# Patient Record
Sex: Male | Born: 1965 | Race: White | Hispanic: No | Marital: Married | Smoking: Former smoker
Health system: Southern US, Community
[De-identification: ages and names within clinical notes are randomized; demographics above are authoritative.]

## PROBLEM LIST (undated history)

## (undated) DIAGNOSIS — I2510 Atherosclerotic heart disease of native coronary artery without angina pectoris: Secondary | ICD-10-CM

## (undated) DIAGNOSIS — Z9989 Dependence on other enabling machines and devices: Secondary | ICD-10-CM

## (undated) DIAGNOSIS — H269 Unspecified cataract: Secondary | ICD-10-CM

## (undated) DIAGNOSIS — I10 Essential (primary) hypertension: Secondary | ICD-10-CM

## (undated) DIAGNOSIS — D751 Secondary polycythemia: Secondary | ICD-10-CM

## (undated) DIAGNOSIS — Z95810 Presence of automatic (implantable) cardiac defibrillator: Secondary | ICD-10-CM

## (undated) DIAGNOSIS — I513 Intracardiac thrombosis, not elsewhere classified: Secondary | ICD-10-CM

## (undated) DIAGNOSIS — M810 Age-related osteoporosis without current pathological fracture: Secondary | ICD-10-CM

## (undated) DIAGNOSIS — N183 Chronic kidney disease, stage 3 unspecified: Secondary | ICD-10-CM

## (undated) DIAGNOSIS — M329 Systemic lupus erythematosus, unspecified: Secondary | ICD-10-CM

## (undated) DIAGNOSIS — M87059 Idiopathic aseptic necrosis of unspecified femur: Secondary | ICD-10-CM

## (undated) DIAGNOSIS — Z87448 Personal history of other diseases of urinary system: Secondary | ICD-10-CM

## (undated) DIAGNOSIS — I5084 End stage heart failure: Secondary | ICD-10-CM

## (undated) DIAGNOSIS — R51 Headache: Secondary | ICD-10-CM

## (undated) DIAGNOSIS — K219 Gastro-esophageal reflux disease without esophagitis: Secondary | ICD-10-CM

## (undated) DIAGNOSIS — E785 Hyperlipidemia, unspecified: Secondary | ICD-10-CM

## (undated) DIAGNOSIS — Z940 Kidney transplant status: Secondary | ICD-10-CM

## (undated) DIAGNOSIS — G4733 Obstructive sleep apnea (adult) (pediatric): Secondary | ICD-10-CM

## (undated) DIAGNOSIS — Z5189 Encounter for other specified aftercare: Secondary | ICD-10-CM

## (undated) DIAGNOSIS — K449 Diaphragmatic hernia without obstruction or gangrene: Secondary | ICD-10-CM

## (undated) DIAGNOSIS — K2270 Barrett's esophagus without dysplasia: Secondary | ICD-10-CM

## (undated) DIAGNOSIS — R7301 Impaired fasting glucose: Secondary | ICD-10-CM

## (undated) DIAGNOSIS — IMO0002 Reserved for concepts with insufficient information to code with codable children: Secondary | ICD-10-CM

## (undated) DIAGNOSIS — I509 Heart failure, unspecified: Secondary | ICD-10-CM

## (undated) DIAGNOSIS — I255 Ischemic cardiomyopathy: Secondary | ICD-10-CM

## (undated) DIAGNOSIS — N182 Chronic kidney disease, stage 2 (mild): Secondary | ICD-10-CM

## (undated) HISTORY — DX: End stage heart failure: I50.84

## (undated) HISTORY — DX: Secondary polycythemia: D75.1

## (undated) HISTORY — PX: INSERT / REPLACE / REMOVE PACEMAKER: SUR710

## (undated) HISTORY — DX: Systemic lupus erythematosus, unspecified: M32.9

## (undated) HISTORY — DX: Ischemic cardiomyopathy: I25.5

## (undated) HISTORY — DX: Personal history of other diseases of urinary system: Z87.448

## (undated) HISTORY — DX: Impaired fasting glucose: R73.01

## (undated) HISTORY — DX: Intracardiac thrombosis, not elsewhere classified: I51.3

## (undated) HISTORY — DX: Headache: R51

## (undated) HISTORY — DX: Idiopathic aseptic necrosis of unspecified femur: M87.059

## (undated) HISTORY — DX: Chronic kidney disease, stage 2 (mild): N18.2

## (undated) HISTORY — PX: TYMPANOSTOMY TUBE PLACEMENT: SHX32

## (undated) HISTORY — DX: Obstructive sleep apnea (adult) (pediatric): G47.33

## (undated) HISTORY — DX: Age-related osteoporosis without current pathological fracture: M81.0

## (undated) HISTORY — DX: Presence of automatic (implantable) cardiac defibrillator: Z95.810

## (undated) HISTORY — DX: Barrett's esophagus without dysplasia: K22.70

## (undated) HISTORY — PX: CARDIAC CATHETERIZATION: SHX172

## (undated) HISTORY — DX: Kidney transplant status: Z94.0

## (undated) HISTORY — DX: Unspecified cataract: H26.9

## (undated) HISTORY — PX: CARDIOVASCULAR STRESS TEST: SHX262

## (undated) HISTORY — PX: RENAL BIOPSY: SHX156

## (undated) HISTORY — DX: Encounter for other specified aftercare: Z51.89

## (undated) HISTORY — DX: Essential (primary) hypertension: I10

## (undated) HISTORY — DX: Chronic kidney disease, stage 3 unspecified: N18.30

## (undated) HISTORY — DX: Reserved for concepts with insufficient information to code with codable children: IMO0002

## (undated) HISTORY — PX: US ECHOCARDIOGRAPHY: HXRAD669

## (undated) HISTORY — DX: Dependence on other enabling machines and devices: Z99.89

## (undated) HISTORY — DX: Atherosclerotic heart disease of native coronary artery without angina pectoris: I25.10

## (undated) HISTORY — DX: Hyperlipidemia, unspecified: E78.5

## (undated) SURGERY — Surgical Case
Anesthesia: *Unknown

---

## 2008-05-14 HISTORY — PX: PARTIAL HIP ARTHROPLASTY: SHX733

## 2008-11-30 ENCOUNTER — Encounter: Payer: Self-pay | Admitting: Cardiovascular Disease

## 2009-01-28 ENCOUNTER — Encounter: Payer: Self-pay | Admitting: Cardiovascular Disease

## 2009-02-01 ENCOUNTER — Ambulatory Visit: Payer: Self-pay | Admitting: Cardiovascular Disease

## 2009-02-01 DIAGNOSIS — I255 Ischemic cardiomyopathy: Secondary | ICD-10-CM | POA: Insufficient documentation

## 2009-02-01 DIAGNOSIS — M329 Systemic lupus erythematosus, unspecified: Secondary | ICD-10-CM | POA: Insufficient documentation

## 2009-02-01 DIAGNOSIS — N189 Chronic kidney disease, unspecified: Secondary | ICD-10-CM | POA: Insufficient documentation

## 2009-02-02 ENCOUNTER — Encounter: Admission: RE | Admit: 2009-02-02 | Discharge: 2009-02-02 | Payer: Self-pay | Admitting: Orthopedic Surgery

## 2009-02-07 ENCOUNTER — Telehealth (INDEPENDENT_AMBULATORY_CARE_PROVIDER_SITE_OTHER): Payer: Self-pay | Admitting: *Deleted

## 2009-02-08 ENCOUNTER — Ambulatory Visit: Payer: Self-pay

## 2009-02-08 ENCOUNTER — Encounter: Payer: Self-pay | Admitting: Cardiology

## 2009-02-11 HISTORY — PX: ESOPHAGOGASTRODUODENOSCOPY: SHX1529

## 2009-02-17 ENCOUNTER — Telehealth (INDEPENDENT_AMBULATORY_CARE_PROVIDER_SITE_OTHER): Payer: Self-pay | Admitting: *Deleted

## 2009-02-21 ENCOUNTER — Telehealth: Payer: Self-pay | Admitting: Cardiovascular Disease

## 2009-02-23 ENCOUNTER — Inpatient Hospital Stay (HOSPITAL_COMMUNITY): Admission: RE | Admit: 2009-02-23 | Discharge: 2009-02-27 | Payer: Self-pay | Admitting: Orthopedic Surgery

## 2009-02-26 ENCOUNTER — Encounter: Payer: Self-pay | Admitting: Gastroenterology

## 2009-02-27 ENCOUNTER — Ambulatory Visit: Payer: Self-pay | Admitting: Gastroenterology

## 2009-06-08 ENCOUNTER — Encounter (INDEPENDENT_AMBULATORY_CARE_PROVIDER_SITE_OTHER): Payer: Self-pay | Admitting: *Deleted

## 2009-08-10 ENCOUNTER — Ambulatory Visit: Payer: Self-pay | Admitting: Cardiovascular Disease

## 2009-08-10 DIAGNOSIS — E782 Mixed hyperlipidemia: Secondary | ICD-10-CM | POA: Insufficient documentation

## 2009-08-10 DIAGNOSIS — I10 Essential (primary) hypertension: Secondary | ICD-10-CM | POA: Insufficient documentation

## 2010-05-14 DIAGNOSIS — I513 Intracardiac thrombosis, not elsewhere classified: Secondary | ICD-10-CM

## 2010-05-14 HISTORY — DX: Intracardiac thrombosis, not elsewhere classified: I51.3

## 2010-06-13 NOTE — Letter (Signed)
Summary: Appointment - Reminder [place], Dousman  [address] [place]   [place], [place] [address]   Phone: [phone]  Fax: [phone]     June 08, 2009 MRN: [id number]   [facility] [place] [address] [place], [place]  [address]   Dear Mr. Beth,  Our records indicate that it is time to schedule a follow-up appointment with Dr. Johnsie Cancel. It is very important that we reach you to schedule this appointment. We look forward to participating in your health care needs. Please contact us at the number listed above at your earliest convenience to schedule your appointment.  If you are unable to make an appointment at this time, give [place] a call so we can update our records.  Sincerely,   Darnell Level [facility] Scheduling Team

## 2010-06-13 NOTE — Assessment & Plan Note (Signed)
Summary: 62MO ROV   History of Present Illness: Roy Dyer  has long standing Lupus which required high dose steroids and made him osteopenic.  He has known CAD.  He had 3.43mm Vision stents placed to the RCA and Circ in [place] in 12/1999.  He had "heartburn" and diaphoresis as his anginal equivalent.  These symptoms have not returned but his activity has been very lmited due to his hip pain.  His last cath was in 2005 and his stents were patent.  He had a nonischemic myovue on 01/2009 to clear him for hip surgery   He denies SSCP, palpitatons, SOB, edema or syncope Dr Mayer Camel ended up doing his surgery on the left hip succesfully with good pain relief.  He will eventually need the right one done.  Current Problems (verified): 1)  Renal Failure, Chronic  (ICD-585.9) 2)  Preoperative Examination  (ICD-V72.84) 3)  Lupus  (ICD-710.0) 4)  Mi  (ICD-410.90)  Current Medications (verified): 1)  Lisinopril 10 Mg Tabs (Lisinopril) .... Take One Tablet By Mouth Daily 2)  Metoprolol Tartrate 100 Mg Tabs (Metoprolol Tartrate) .... Take One Tablet By Mouth Twice A Day 3)  Furosemide 40 Mg Tabs (Furosemide) .... Take One Tablet By Mouth Daily. 4)  Nifedipine 10 Mg Caps (Nifedipine) .[name].. 1 Tab By Mouth Two Times A Day 5)  Allopurinol 300 Mg Tabs (Allopurinol) .[name].. 1 Tab By Mouth Once Daily 6)  Nexium 40 Mg Cpdr (Esomeprazole Magnesium) .[name].. 1 Tab By Mouth Once Daily 7)  Aspirin Ec 325 Mg Tbec (Aspirin) .... Take One Tablet By Mouth Daily 8)  Lipitor 20 Mg Tabs (Atorvastatin Calcium) .... Take One Tablet By Mouth Daily. 9)  Niaspan 500 Mg Cr-Tabs (Niacin (Antihyperlipidemic)) .... 2 Tabs By Mouth At Bedtime  Allergies (verified): No Known Drug Allergies  Past History:  Past Medical History: Last updated: 2009/03/01 Lupus CRF:  Cr 2.8 Dr. 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   Vital Signs:  Patient profile:   45 year old male Height:      67 inches Weight:      228 pounds BMI:     35.84 Pulse rate:   73 / minute Resp:     14 per minute BP sitting:   114 / 77  (left arm)  Vitals Entered By: Burnett Kanaris (August 10, 2009 4:14 PM)  Physical Exam  General:  Affect appropriate Healthy:  appears stated age 66: normal Neck supple with no adenopathy JVP normal no bruits no thyromegaly Lungs clear with no wheezing and good diaphragmatic motion Heart:  S1/S2 no murmur,rub, gallop or click PMI normal Abdomen: benighn, BS positve, no tenderness, no AAA no bruit.  No HSM or HJR Distal pulses intact with no bruits No edema Neuro non-focal Skin warm and dry    Impression & Recommendations:  Problem # 1:  MI (ICD-410.90) CAD with prev stent.  Nonischemic myovue 9/10  Continue ASA and BB His updated medication list for this problem includes:    Lisinopril 10  Mg Tabs (Lisinopril) .[name]... Take one tablet by mouth daily    Metoprolol Tartrate 100 Mg Tabs (Metoprolol tartrate) .[name]... Take one tablet by mouth twice a day    Nifedipine 10 Mg Caps (Nifedipine) .[name]... 1 tab by mouth two times a day    Aspirin Ec 325 Mg Tbec (Aspirin) .[name]... Take one tablet by mouth daily  Problem # 2:  ESSENTIAL HYPERTENSION, BENIGN (ICD-401.1) Well controlled His updated medication list for this problem includes:    Lisinopril 10 Mg Tabs (Lisinopril) .[name]... Take one tablet by mouth daily    Metoprolol Tartrate 100 Mg Tabs (Metoprolol tartrate)  .[name]... Take one tablet by mouth twice a day    Furosemide 40 Mg Tabs (Furosemide) .[name]... Take one tablet by mouth daily.    Nifedipine 10 Mg Caps (Nifedipine) .[name]... 1 tab by mouth two times a day    Aspirin Ec 325 Mg Tbec (Aspirin) .[name]... Take one tablet by mouth daily  Problem # 3:  MIXED HYPERLIPIDEMIA (ICD-272.2) F/U labs primary  Continue statin His updated medication list for this problem includes:    Lipitor 20 Mg Tabs (Atorvastatin calcium) .[name]... Take one tablet by mouth daily.    Niaspan 500 Mg Cr-tabs (Niacin (antihyperlipidemic)) .[name]... 2 tabs by mouth at bedtime  Patient Instructions: 1)  Your physician recommends that you schedule a follow-up appointment in: [place] 2)  Your physician recommends that you continue on your current medications as directed. Please refer to the Current Medication list given to you today.

## 2010-07-03 ENCOUNTER — Inpatient Hospital Stay (HOSPITAL_COMMUNITY)
Admission: EM | Admit: 2010-07-03 | Discharge: 2010-07-06 | DRG: 281 | Disposition: A | Discharge status: Home / Self Care | Payer: Managed Care, Other (non HMO) | Attending: Cardiovascular Disease | Admitting: Cardiovascular Disease

## 2010-07-03 DIAGNOSIS — I214 Non-ST elevation (NSTEMI) myocardial infarction: Principal | ICD-10-CM | POA: Diagnosis present

## 2010-07-03 DIAGNOSIS — F17200 Nicotine dependence, unspecified, uncomplicated: Secondary | ICD-10-CM | POA: Diagnosis present

## 2010-07-03 DIAGNOSIS — Z9861 Coronary angioplasty status: Secondary | ICD-10-CM

## 2010-07-03 DIAGNOSIS — M169 Osteoarthritis of hip, unspecified: Secondary | ICD-10-CM | POA: Diagnosis present

## 2010-07-03 DIAGNOSIS — M329 Systemic lupus erythematosus, unspecified: Secondary | ICD-10-CM | POA: Diagnosis present

## 2010-07-03 DIAGNOSIS — IMO0002 Reserved for concepts with insufficient information to code with codable children: Secondary | ICD-10-CM

## 2010-07-03 DIAGNOSIS — R079 Chest pain, unspecified: Secondary | ICD-10-CM

## 2010-07-03 DIAGNOSIS — Z7982 Long term (current) use of aspirin: Secondary | ICD-10-CM

## 2010-07-03 DIAGNOSIS — E785 Hyperlipidemia, unspecified: Secondary | ICD-10-CM | POA: Diagnosis present

## 2010-07-03 DIAGNOSIS — I129 Hypertensive chronic kidney disease with stage 1 through stage 4 chronic kidney disease, or unspecified chronic kidney disease: Secondary | ICD-10-CM | POA: Diagnosis present

## 2010-07-03 DIAGNOSIS — I2510 Atherosclerotic heart disease of native coronary artery without angina pectoris: Secondary | ICD-10-CM | POA: Diagnosis present

## 2010-07-03 DIAGNOSIS — M1610 Unilateral primary osteoarthritis, unspecified hip: Secondary | ICD-10-CM | POA: Diagnosis present

## 2010-07-03 DIAGNOSIS — K219 Gastro-esophageal reflux disease without esophagitis: Secondary | ICD-10-CM | POA: Diagnosis present

## 2010-07-03 DIAGNOSIS — M899 Disorder of bone, unspecified: Secondary | ICD-10-CM | POA: Diagnosis present

## 2010-07-03 DIAGNOSIS — N184 Chronic kidney disease, stage 4 (severe): Secondary | ICD-10-CM | POA: Diagnosis present

## 2010-07-04 ENCOUNTER — Emergency Department (HOSPITAL_COMMUNITY): Payer: Managed Care, Other (non HMO)

## 2010-07-04 DIAGNOSIS — R072 Precordial pain: Secondary | ICD-10-CM

## 2010-07-04 LAB — CBC
MCH: 32.9 pg (ref 26.0–34.0)
MCHC: 34.8 g/dL (ref 30.0–36.0)
MCV: 94.5 fL (ref 78.0–100.0)
RDW: 13.7 % (ref 11.5–15.5)
WBC: 11.6 10*3/uL — ABNORMAL HIGH (ref 4.0–10.5)

## 2010-07-04 LAB — HEMOGLOBIN A1C: Mean Plasma Glucose: 117 mg/dL — ABNORMAL HIGH (ref <117)

## 2010-07-04 LAB — CARDIAC PANEL(CRET KIN+CKTOT+MB+TROPI)
CK, MB: 3.9 ng/mL (ref 0.3–4.0)
CK, MB: 2.5 ng/mL (ref 0.3–4.0)
Relative Index: INVALID (ref 0.0–2.5)
Relative Index: INVALID (ref 0.0–2.5)
Total CK: 89 U/L (ref 7–232)

## 2010-07-04 LAB — HEPARIN LEVEL (UNFRACTIONATED): Heparin Unfractionated: 0.44 IU/mL (ref 0.30–0.70)

## 2010-07-04 LAB — BASIC METABOLIC PANEL
BUN: 41 mg/dL — ABNORMAL HIGH (ref 6–23)
GFR calc non Af Amer: 24 mL/min — ABNORMAL LOW (ref >60)
Potassium: 5.1 mEq/L (ref 3.5–5.1)
Sodium: 138 mEq/L (ref 135–145)

## 2010-07-04 LAB — TROPONIN I: Troponin I: 0.2 ng/mL — ABNORMAL HIGH (ref 0.00–0.06)

## 2010-07-04 LAB — PROTIME-INR
INR: 0.9 (ref 0.00–1.49)
Prothrombin Time: 12.4 seconds (ref 11.6–15.2)

## 2010-07-04 LAB — LIPID PANEL
Cholesterol: 118 mg/dL (ref 0–200)
HDL: 47 mg/dL (ref >39)

## 2010-07-04 LAB — DIFFERENTIAL
Monocytes Relative: 8 % (ref 3–12)
Neutro Abs: 4.9 10*3/uL (ref 1.7–7.7)

## 2010-07-04 LAB — CK TOTAL AND CKMB (NOT AT ARMC)
CK, MB: 1.5 ng/mL (ref 0.3–4.0)
Relative Index: INVALID (ref 0.0–2.5)

## 2010-07-04 LAB — POCT CARDIAC MARKERS
CKMB, poc: <1 ng/mL — ABNORMAL LOW (ref 1.0–8.0)
Myoglobin, poc: 179 ng/mL (ref 12–200)

## 2010-07-05 LAB — HEPARIN LEVEL (UNFRACTIONATED)
Heparin Unfractionated: 0.28 IU/mL — ABNORMAL LOW (ref 0.30–0.70)
Heparin Unfractionated: 0.63 IU/mL (ref 0.30–0.70)

## 2010-07-05 LAB — CBC
MCH: 32.1 pg (ref 26.0–34.0)
MCV: 95.4 fL (ref 78.0–100.0)
Platelets: 170 10*3/uL (ref 150–400)
RDW: 13.8 % (ref 11.5–15.5)
WBC: 7.8 10*3/uL (ref 4.0–10.5)

## 2010-07-05 LAB — COMPREHENSIVE METABOLIC PANEL
ALT: 14 U/L (ref 0–53)
Alkaline Phosphatase: 145 U/L — ABNORMAL HIGH (ref 39–117)
CO2: 24 mEq/L (ref 19–32)
Calcium: 8.9 mg/dL (ref 8.4–10.5)
Chloride: 111 mEq/L (ref 96–112)
Potassium: 5 mEq/L (ref 3.5–5.1)
Total Bilirubin: 0.4 mg/dL (ref 0.3–1.2)
Total Protein: 5.6 g/dL — ABNORMAL LOW (ref 6.0–8.3)

## 2010-07-05 LAB — CARDIAC PANEL(CRET KIN+CKTOT+MB+TROPI): Relative Index: INVALID (ref 0.0–2.5)

## 2010-07-06 ENCOUNTER — Observation Stay (HOSPITAL_COMMUNITY): Payer: Managed Care, Other (non HMO)

## 2010-07-06 MED ORDER — TECHNETIUM TC 99M TETROFOSMIN IV KIT
30.0000 | PACK | Freq: Once | INTRAVENOUS | Status: AC | PRN
  Administered 2010-07-06: 14:00:00 30 via INTRAVENOUS

## 2010-07-06 MED ORDER — TECHNETIUM TC 99M TETROFOSMIN IV KIT
10.0000 | PACK | Freq: Once | INTRAVENOUS | Status: AC | PRN
  Administered 2010-07-06: 10 via INTRAVENOUS

## 2010-07-13 NOTE — H&P (Signed)
NAMEEDUIN, BRESLIN NO.:  [id number]  MEDICAL RECORD NO.:  [id number]           PATIENT TYPE:  E  LOCATION:  [facility]                         FACILITY:  [facility]  PHYSICIAN:  Crissie Reese, MD    DATE OF BIRTH:  08-24-1965  DATE OF ADMISSION:  07/03/2010 DATE OF DISCHARGE:                             HISTORY & PHYSICAL   PRIMARY CARDIOLOGIST:  Collier Salina C. Johnsie Cancel, MD, [facility]  CHIEF COMPLAINT:  Chest pain and throat pain.  HISTORY OF PRESENT ILLNESS:  This is a 45 year old white male with past medical history significant for coronary disease status post stenting to the right coronary artery and left circumflex artery in 2001, chronic kidney disease, and lupus who presents for evaluation of throat burning and chest pain.  Historically, the patient underwent cardiac catheterization in 2001 with stenting.  Since that time, he has had a negative catheterization in 2005 as well as a normal Myoview study in 2010.  He has not had to use any nitroglycerin since his initial acute coronary syndrome.  This evening at approximately 11:00 p.m., he went to bed and after lying down in bed he developed acute burning in his throat associated with mild chest tightness.  This was also associated with shortness of breath.  He got up to get some water and then took a nitroglycerin and these symptoms gradually subsided after approximately 15 minutes. However, then they returned with similar symptoms, he took another nitroglycerin, and called emergency medical services.  Currently, his symptoms have resolved.  He states that these symptoms are different than his previous symptoms with his acute coronary syndrome.  With respect to his functional status, he says that he is limited in his activity by chronic hip pain.  However, he is able to do his activities of daily living.  He is able to climb stairs without any significant exertional chest pain or shortness of breath.  He denies any  PND, orthopnea, or lower extremity edema.  He denies any tachy palpitations or syncope.  In the emergency room, the patient received aspirin, nitroglycerin paste, morphine and was written for IV unfractionated heparin.  PAST MEDICAL HISTORY: 1. Coronary artery disease.  He is status post Vision stent to the     right coronary and left circumflex in 2001.  He had a cardiac     catheterization which showed patent stents in 2005 and had a normal     stress test in 2010. 2. Hypertension. 3. Hyperlipidemia. 4. Chronic kidney disease with a baseline creatinine of 2.9. 5. Lupus with history of chronic steroid use. 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 7. Status post left hip surgery and active right hip osteoarthritis.  SOCIAL HISTORY:  The patient lives at home and works in [employer].  He continues to smoke half-pack per day. He denies any alcohol use or drug use.  FAMILY HISTORY:  Notable for premature coronary artery disease and his father had a myocardial infarction at age 42.  ALLERGIES:  No known drug allergies.MEDICATIONS: 1. Niaspan 1 gram at bedtime. 2. Lipitor 20 mg daily. 3. Lisinopril 10 mg daily. 4.  Lasix 40 mg daily. 5. Allopurinol 300 mg daily. 6. Nexium 40 mg daily. 7. Aspirin 325 mg daily. 8. Nifedipine 10 mg daily. 9. Metoprolol tartrate 100 mg b.i.d.  PHYSICAL EXAMINATION:  VITAL SIGNS:  Temperature afebrile, blood pressure is 121/83, pulse 63, respirations 18, oxygen saturation 97% on room air. GENERAL:  No acute distress. HEENT:  Normocephalic and atraumatic.  Pupils equal, round, and reactive to light and accommodation.  Oropharynx pink and moist without lesions. NECK:  Supple.  No lymphadenopathy.  No jugular venous distention.  No masses. CARDIOVASCULAR:  Regular rate and rhythm with no murmurs, rubs, or gallops. CHEST:  Clear to auscultation bilaterally. ABDOMEN:  Positive bowel sounds, soft, nontender, and nondistended. EXTREMITIES:  No  clubbing, cyanosis, or edema.  Dorsalis pedis pulses 2+ bilaterally. SKIN:  No rashes. BACK:  No CVA tenderness. NEUROLOGIC:  No focal deficits. PSYCH:  Normal affect.  PERTINENT DATA: 1. Labs notable for white blood count of 11.6, hemoglobin 15.6, and     platelets 208.  Potassium 5.1, BUN 41, creatinine 2.9 and these     were stable.  Troponin less than 0.05. 2. Chest x-ray shows no acute cardiopulmonary disease. 3. EKG from 23:57 demonstrates sinus rhythm at 65 beats per minute.     No acute ST or T-wave changes.  IMPRESSION AND PLAN:  Mr. Ficca is a 45 year old white male with a past medical history significant for coronary disease status post stenting in 2001, chronic kidney disease, hypertension, hyperlipidemia, and lupus who presents for evaluation of throat pain and chest pain.  He has negative cardiac biomarkers and a normal EKG. 1. We will admit the patient to [facility] under Dr. Kyla Balzarine     care. 2. Chest pain and throat pain.  His symptoms could be coming from     unstable angina and they are somewhat atypical and were different     than his previous myocardial infarction.  Additionally, he hasobjective markers including his troponins and his EKG did not show     any evidence of myocardial ischemia.  Other etiologies that are     more likely include gastroesophageal reflux disease and esophageal     spasms.  We will admit the patient and rule out for myocardial     infarction with serial cardiac enzymes.  We will place him on IV     heparin drip until he rules out for myocardial infarction.  We will     continue his home medications of aspirin, beta-blocker, and statin.     If he rules out for myocardial infarction, I think he would be a     reasonable candidate for a stress test to further risk stratify     him.  I would not recommend proceeding to a cardiac catheterization     at this time unless he develops subjective evidence of myocardial     ischemia.  I  would not recommend doing this based on his chronic     kidney disease.  I have counseled him on the need for tobacco     cessation and lifestyle modification. 3. Hypertension, appears to be well-controlled.  Continue home     medications. 4. Hyperlipidemia.  Check fasting lipid profile.  Continue Lipitor. 5. Chronic kidney disease with baseline creatinine of 2.8, this is     stable.  We will continue to monitor this. 6. History of gout.  Continue home allopurinol. 7. Gastroesophageal reflux disease.  Continue Nexium. 8. Deep venous thrombosis prophylaxis, not  indicated as the patient is     on systemic anticoagulation with heparin.     Crissie Reese, MD     SJT/MEDQ  D:  07/04/2010  T:  07/04/2010  Job:  [id number]  Electronically Signed by Clovis Riley MD on 07/13/2010 08:34:08 PM

## 2010-07-21 NOTE — Discharge Summary (Signed)
NAMEJEFFRIE, DICOLA NO.:  [id number]  MEDICAL RECORD NO.:  [id number]           PATIENT TYPE:  I  LOCATION:  6526                         FACILITY:  [facility]  PHYSICIAN:  Safiya Girdler C. Johnsie Cancel, MD, FACCDATE OF BIRTH:  05-Feb-1966  DATE OF ADMISSION:  07/03/2010 DATE OF DISCHARGE:  07/06/2010                              DISCHARGE SUMMARY   DISCHARGE DIAGNOSES: 1. Non-ST-elevation myocardial infarction.     a.     Peak troponin on second set with normal CK and MB,      downtrending afterwards.  CK and MB were never elevated.     bLeane Call, July 06, 2010:  Images revealed normal      uptake in all segments.  Wall motion analysis reveals EF of 55%      with no significant wall motion abnormalities.  No definite scar      or ischemia.  Normal pharmacologic stress nuclear scan. 2. Ongoing tobacco abuse (20+ pack years, currently one-half ppd).     a.     Tobacco cessation consult completed as well as extensive      education/instruction by [facility] staff.  SECONDARY DIAGNOSES: 1. Coronary artery disease.     a.     S/P stenting to RCA and left circumflex, 2001.     b.     Cardiac catheterization without significant findings, 2005.     c.     Negative Myoview, 2010. 2. Lupus (history of chronic steroid use). 3. Chronic kidney disease, baseline creatinine 2.9. 4. Hypertension. 5. Hyperlipidemia. 6. Osteopenia (from steroids). 7. S/P left hip surgery, active right hip osteoarthritis.  ALLERGIES:  NKDA.  PROCEDURES: 1. EKG, July 03, 2010:  SR, 65 bpm, no significant ST-T wave     changes. 2. Chest x-ray on July 03, 2010:  No acute cardiopulmonary     disease. 3. [name], July 06, 2010:  Please see discharge     diagnoses section #1, subsection b.  HISTORY OF PRESENT ILLNESS:  Roy Dyer is a 45 year old Caucasian gentleman with the above noted complex medical history who presented to [facility] on the late evening  of July 03, 2010, with complaints of chest discomfort.  The patient reports being in his usual state of health until approximately 11:00 p.m. on the date of presentation when he developed acute onset of burning after lying down flat in bed to go to sleep.  The burning was in his throat and was associated with mild chest tightness. It was also associated with shortness of breath.  He got up and took some water and then took some nitroglycerin with gradual resolution of his symptoms after approximately 15 minutes.  Unfortunately, they did return and he took another nitroglycerin and called EMS.  When seen in the emergency department by the fellow on-call for cardiology, his symptoms had resolved again.  He did report that these symptoms are different from his prior angina.  In the ED, the patient received aspirin, nitroglycerin paste, morphine, and was written for IV heparin.  HOSPITAL COURSE:  The  patient was noted to have mildly elevated cardiac enzymes after initial point-of-care markers were negative.  First set ofenzymes, troponin 0.20, second set peaked at 1.05, third set troponin 0.52, and fourth and final set 0.41.  Of note, the patient's CK and MB were always within normal limits.  Due to the patient's chronic kidney disease and curious cardiac enzyme elevation with no significant EKG changes, the patient was set up for [facility] for risk stratification.  Luckily, that study showed no ischemia and no scar, normal left ventricular systolic function, and as the patient was no longer having any symptoms, he was deemed stable for discharge on the afternoon of July 06, 2010.  At that time, the patient will receive his old medication list unchanged, followup instructions, and all questions and concerns were addressed prior to him leaving the hospital. The patient will follow up with his primary cardiologist in 2-4 weeks (appointment to be scheduled as office is  currently closed and patient will be contacted) as well as with his primary care physician in the next 1-2 weeks.  DISCHARGE LABS:  WBC is 7.0, HGB 14.6, HCT 43.4, and PLT count is 170. Neutrophils on admission were 42, eosinophils 6; lymphocytes, absolute 5.0; otherwise all values in differential within normal limits.  Pro time 12.4, INR 0.90.  Sodium 140, potassium 5.0, chloride 111, bicarb 24, BUN 42, creatinine 2.99, and glucose 100.  Liver function tests notable for all values within normal limits except for alkaline phosphatase at 145; total protein 5.6, albumin 3.3, calcium 8.9. Hemoglobin A1c 5.7%, mean plasma glucose 117.  Cardiac enzymes, please see hospital course.  Total cholesterol 118, triglycerides 78, HDL 47, LDL 55, and total cholesterol/HDL ratio of 2.5.  FOLLOWUP PLANS AND APPOINTMENTS:  Please see hospital course.  DISCHARGE MEDICATIONS: 1. Allopurinol 300 mg 1 tablet p.o. daily. 2. Aspirin 325 mg 1 tablet p.o. daily. 3. Lasix 40 mg 1 tablet p.o. daily. 4. Lipitor 20 mg 1 tablet p.o. daily. 5. Lisinopril 10 mg 1 tablet p.o. daily. 6. Metoprolol tartrate 100 mg 1 tablet p.o. b.i.d. 7. Nexium 40 mg 1 capsule p.o. daily. 8. Niaspan 500 mg 2 tablets p.o. at bedtime. 9. Nifedipine 10 mg 1 tablet p.o. daily.  DURATION OF DISCHARGE ENCOUNTER INCLUDING PHYSICIAN TIME:  35 minutes.     [name], PAC   ______________________________ Wallis Bamberg. Johnsie Cancel, MD, [facility]    MS/MEDQ  D:  07/06/2010  T:  07/07/2010  Job:  [id number]  Electronically Signed by [name] PAC on 07/18/2010 04:22:50 PM Electronically Signed by [name] MD [place] on 07/20/2010 09:07:04 AM

## 2010-07-26 ENCOUNTER — Encounter: Payer: Self-pay | Admitting: Cardiovascular Disease

## 2010-08-02 ENCOUNTER — Inpatient Hospital Stay (HOSPITAL_COMMUNITY)
Admission: EM | Admit: 2010-08-02 | Discharge: 2010-08-07 | DRG: 249 | Disposition: A | Discharge status: Home / Self Care | Payer: Managed Care, Other (non HMO) | Source: Ambulatory Visit | Attending: Cardiovascular Disease | Admitting: Cardiovascular Disease

## 2010-08-02 ENCOUNTER — Telehealth: Payer: Self-pay | Admitting: Cardiovascular Disease

## 2010-08-02 ENCOUNTER — Inpatient Hospital Stay (HOSPITAL_COMMUNITY): Payer: Managed Care, Other (non HMO)

## 2010-08-02 DIAGNOSIS — M329 Systemic lupus erythematosus, unspecified: Secondary | ICD-10-CM | POA: Diagnosis present

## 2010-08-02 DIAGNOSIS — Z7982 Long term (current) use of aspirin: Secondary | ICD-10-CM

## 2010-08-02 DIAGNOSIS — I2510 Atherosclerotic heart disease of native coronary artery without angina pectoris: Secondary | ICD-10-CM

## 2010-08-02 DIAGNOSIS — N179 Acute kidney failure, unspecified: Secondary | ICD-10-CM | POA: Diagnosis not present

## 2010-08-02 DIAGNOSIS — I252 Old myocardial infarction: Secondary | ICD-10-CM

## 2010-08-02 DIAGNOSIS — I2589 Other forms of chronic ischemic heart disease: Secondary | ICD-10-CM | POA: Diagnosis present

## 2010-08-02 DIAGNOSIS — F17200 Nicotine dependence, unspecified, uncomplicated: Secondary | ICD-10-CM | POA: Diagnosis present

## 2010-08-02 DIAGNOSIS — N039 Chronic nephritic syndrome with unspecified morphologic changes: Secondary | ICD-10-CM | POA: Diagnosis present

## 2010-08-02 DIAGNOSIS — I2109 ST elevation (STEMI) myocardial infarction involving other coronary artery of anterior wall: Principal | ICD-10-CM | POA: Diagnosis present

## 2010-08-02 DIAGNOSIS — IMO0002 Reserved for concepts with insufficient information to code with codable children: Secondary | ICD-10-CM

## 2010-08-02 DIAGNOSIS — M899 Disorder of bone, unspecified: Secondary | ICD-10-CM | POA: Diagnosis present

## 2010-08-02 DIAGNOSIS — I129 Hypertensive chronic kidney disease with stage 1 through stage 4 chronic kidney disease, or unspecified chronic kidney disease: Secondary | ICD-10-CM | POA: Diagnosis present

## 2010-08-02 DIAGNOSIS — R0789 Other chest pain: Secondary | ICD-10-CM

## 2010-08-02 DIAGNOSIS — B029 Zoster without complications: Secondary | ICD-10-CM | POA: Diagnosis present

## 2010-08-02 DIAGNOSIS — Z96649 Presence of unspecified artificial hip joint: Secondary | ICD-10-CM

## 2010-08-02 DIAGNOSIS — K219 Gastro-esophageal reflux disease without esophagitis: Secondary | ICD-10-CM | POA: Diagnosis present

## 2010-08-02 DIAGNOSIS — E785 Hyperlipidemia, unspecified: Secondary | ICD-10-CM | POA: Diagnosis present

## 2010-08-02 DIAGNOSIS — I517 Cardiomegaly: Secondary | ICD-10-CM

## 2010-08-02 DIAGNOSIS — N183 Chronic kidney disease, stage 3 unspecified: Secondary | ICD-10-CM | POA: Diagnosis present

## 2010-08-02 LAB — POCT ACTIVATED CLOTTING TIME: Activated Clotting Time: 876 seconds

## 2010-08-02 LAB — CBC
HCT: 43.3 % (ref 39.0–52.0)
Hemoglobin: 15.1 g/dL (ref 13.0–17.0)
MCH: 33.1 pg (ref 26.0–34.0)
MCHC: 34.9 g/dL (ref 30.0–36.0)

## 2010-08-02 LAB — POCT I-STAT, CHEM 8
BUN: 40 mg/dL — ABNORMAL HIGH (ref 6–23)
Calcium, Ion: 1.18 mmol/L (ref 1.12–1.32)
Chloride: 115 mEq/L — ABNORMAL HIGH (ref 96–112)
Glucose, Bld: 158 mg/dL — ABNORMAL HIGH (ref 70–99)

## 2010-08-02 LAB — CARDIAC PANEL(CRET KIN+CKTOT+MB+TROPI)
CK, MB: 330.8 ng/mL (ref 0.3–4.0)
Relative Index: 9.5 — ABNORMAL HIGH (ref 0.0–2.5)
Total CK: 3191 U/L — ABNORMAL HIGH (ref 7–232)

## 2010-08-02 LAB — PROTIME-INR: INR: 1.3 (ref 0.00–1.49)

## 2010-08-02 NOTE — Telephone Encounter (Signed)
Advised patient to call 911 and go to the ER.  Trish aware

## 2010-08-02 NOTE — Telephone Encounter (Signed)
Pt having chest pain and sweating for 30 mins. Pt has taken 2 nitros.

## 2010-08-03 ENCOUNTER — Inpatient Hospital Stay (HOSPITAL_COMMUNITY): Payer: Managed Care, Other (non HMO)

## 2010-08-03 DIAGNOSIS — I2109 ST elevation (STEMI) myocardial infarction involving other coronary artery of anterior wall: Secondary | ICD-10-CM

## 2010-08-03 LAB — RENAL FUNCTION PANEL
Albumin: 3.2 g/dL — ABNORMAL LOW (ref 3.5–5.2)
CO2: 20 mEq/L (ref 19–32)
Calcium: 8.7 mg/dL (ref 8.4–10.5)
GFR calc Af Amer: 31 mL/min — ABNORMAL LOW (ref >60)
GFR calc non Af Amer: 26 mL/min — ABNORMAL LOW (ref >60)
Phosphorus: 3.5 mg/dL (ref 2.3–4.6)
Sodium: 141 mEq/L (ref 135–145)

## 2010-08-03 LAB — CARDIAC PANEL(CRET KIN+CKTOT+MB+TROPI)
Relative Index: 12.2 — ABNORMAL HIGH (ref 0.0–2.5)
Total CK: 2409 U/L — ABNORMAL HIGH (ref 7–232)

## 2010-08-03 LAB — CBC
HCT: 44 % (ref 39.0–52.0)
Platelets: 126 10*3/uL — ABNORMAL LOW (ref 150–400)
RDW: 14.2 % (ref 11.5–15.5)
WBC: 7 10*3/uL (ref 4.0–10.5)

## 2010-08-03 LAB — SEDIMENTATION RATE: Sed Rate: 1 mm/hr (ref 0–16)

## 2010-08-03 LAB — HEPARIN LEVEL (UNFRACTIONATED)
Heparin Unfractionated: 0.27 IU/mL — ABNORMAL LOW (ref 0.30–0.70)
Heparin Unfractionated: 0.44 IU/mL (ref 0.30–0.70)

## 2010-08-03 LAB — PTH, INTACT AND CALCIUM: PTH: 125.2 pg/mL — ABNORMAL HIGH (ref 14.0–72.0)

## 2010-08-04 DIAGNOSIS — I2119 ST elevation (STEMI) myocardial infarction involving other coronary artery of inferior wall: Secondary | ICD-10-CM

## 2010-08-04 LAB — BASIC METABOLIC PANEL
BUN: 31 mg/dL — ABNORMAL HIGH (ref 6–23)
Chloride: 108 mEq/L (ref 96–112)
GFR calc non Af Amer: 22 mL/min — ABNORMAL LOW (ref >60)
Glucose, Bld: 101 mg/dL — ABNORMAL HIGH (ref 70–99)
Potassium: 4.2 mEq/L (ref 3.5–5.1)
Sodium: 138 mEq/L (ref 135–145)

## 2010-08-04 LAB — HEPARIN LEVEL (UNFRACTIONATED): Heparin Unfractionated: 0.49 IU/mL (ref 0.30–0.70)

## 2010-08-04 LAB — CBC
Hemoglobin: 14.1 g/dL (ref 13.0–17.0)
MCH: 32.5 pg (ref 26.0–34.0)
MCV: 94.5 fL (ref 78.0–100.0)
Platelets: 106 10*3/uL — ABNORMAL LOW (ref 150–400)
RBC: 4.34 MIL/uL (ref 4.22–5.81)
WBC: 7.4 10*3/uL (ref 4.0–10.5)

## 2010-08-04 LAB — ANTI-DNA ANTIBODY, DOUBLE-STRANDED: ds DNA Ab: 3 IU/mL (ref <30)

## 2010-08-05 ENCOUNTER — Inpatient Hospital Stay (HOSPITAL_COMMUNITY): Payer: Managed Care, Other (non HMO)

## 2010-08-05 LAB — BASIC METABOLIC PANEL
CO2: 23 mEq/L (ref 19–32)
Chloride: 109 mEq/L (ref 96–112)
GFR calc Af Amer: 26 mL/min — ABNORMAL LOW (ref >60)
Potassium: 4.3 mEq/L (ref 3.5–5.1)

## 2010-08-05 LAB — HEPARIN LEVEL (UNFRACTIONATED): Heparin Unfractionated: 0.66 IU/mL (ref 0.30–0.70)

## 2010-08-05 LAB — CBC
HCT: 40.3 % (ref 39.0–52.0)
Hemoglobin: 14 g/dL (ref 13.0–17.0)
MCHC: 34.7 g/dL (ref 30.0–36.0)
RBC: 4.24 MIL/uL (ref 4.22–5.81)

## 2010-08-06 DIAGNOSIS — I428 Other cardiomyopathies: Secondary | ICD-10-CM

## 2010-08-06 LAB — BASIC METABOLIC PANEL
BUN: 33 mg/dL — ABNORMAL HIGH (ref 6–23)
CO2: 23 mEq/L (ref 19–32)
Chloride: 109 mEq/L (ref 96–112)
Creatinine, Ser: 3.29 mg/dL — ABNORMAL HIGH (ref 0.4–1.5)
Glucose, Bld: 91 mg/dL (ref 70–99)
Potassium: 4.2 mEq/L (ref 3.5–5.1)

## 2010-08-06 LAB — CBC
HCT: 39 % (ref 39.0–52.0)
Hemoglobin: 13.6 g/dL (ref 13.0–17.0)
MCH: 33.2 pg (ref 26.0–34.0)
MCV: 95.1 fL (ref 78.0–100.0)
RBC: 4.1 MIL/uL — ABNORMAL LOW (ref 4.22–5.81)
WBC: 5.6 10*3/uL (ref 4.0–10.5)

## 2010-08-06 LAB — HEPARIN LEVEL (UNFRACTIONATED): Heparin Unfractionated: <0.1 IU/mL — ABNORMAL LOW (ref 0.30–0.70)

## 2010-08-07 LAB — BASIC METABOLIC PANEL
BUN: 33 mg/dL — ABNORMAL HIGH (ref 6–23)
CO2: 22 mEq/L (ref 19–32)
Calcium: 8.7 mg/dL (ref 8.4–10.5)
Glucose, Bld: 105 mg/dL — ABNORMAL HIGH (ref 70–99)
Sodium: 135 mEq/L (ref 135–145)

## 2010-08-07 LAB — CBC
HCT: 39.5 % (ref 39.0–52.0)
Hemoglobin: 13.8 g/dL (ref 13.0–17.0)
MCH: 32.9 pg (ref 26.0–34.0)
MCHC: 34.9 g/dL (ref 30.0–36.0)
MCV: 94.3 fL (ref 78.0–100.0)

## 2010-08-09 ENCOUNTER — Encounter: Payer: Managed Care, Other (non HMO) | Admitting: Cardiovascular Disease

## 2010-08-11 ENCOUNTER — Telehealth: Payer: Self-pay | Admitting: Cardiovascular Disease

## 2010-08-11 NOTE — Telephone Encounter (Signed)
Pt would like to return to work prior to April 18 appt with Dr. Johnsie Cancel. He would like to return on April 9 if OK with Dr. Johnsie Cancel.  His job is mostly sitting at a computer.  I told pt I would forward his request to Dr. Johnsie Cancel and we would call him back with his recommendations.

## 2010-08-11 NOTE — Telephone Encounter (Signed)
Pt would like to return to work. Was seen in hospital and Dr Johnsie Cancel told him that he could return sooner that the 18th that his appt is. Could he get a note to return

## 2010-08-13 NOTE — Telephone Encounter (Signed)
Ok to give note to return to work. 

## 2010-08-14 ENCOUNTER — Encounter: Payer: Self-pay | Admitting: *Deleted

## 2010-08-14 NOTE — Telephone Encounter (Signed)
Letter generated and mailed to the pt at his request.

## 2010-08-16 NOTE — Procedures (Signed)
NAMESCORPIO, Roy Dyer NO.:  [id number]  MEDICAL RECORD NO.:  [id number]           PATIENT TYPE:  I  LOCATION:  2909                         FACILITY:  [facility]  PHYSICIAN:  Kathlyn Sacramento, MD     DATE OF BIRTH:  05/29/1965  DATE OF PROCEDURE:  08/02/2010 DATE OF DISCHARGE:                           CARDIAC CATHETERIZATION   PROCEDURES PERFORMED: 1. Left heart catheterization. 2. Coronary angiography. 3. Left anterior descending artery angioplasty, thrombectomy, and bare-     metal stent placement to the mid left anterior descending coronary     artery. 4. Perclose closure device.  CONTRAST USED:  70 mL of Visipaque.  Left ventriculogram was not performed due to chronic kidney disease.  INDICATIONS AND CLINICAL HISTORY:  Mr. Caulley is a 44 year old gentleman with known history of coronary artery disease.  He had angioplasty and stent placement to the right coronary artery and possibly the left circumflex in 2001.  He also has history of lupus nephritis and advanced chronic kidney disease.  He presented actually last month for  symptoms of chest pain and a small non-ST-elevation myocardial infarction.  He underwent a nuclear stress test which showed no convincing evidence of ischemia.  Due to his advanced chronic kidney disease, it was decided to treat him medically.  However, this morning, he woke up around 7:30 and started having chest pain associated with nausea and sweating, similar to his previous myocardial infarction.  EMS were called and EKG showed ST elevation, mostly in the lateral leads, aVL, I, as well as anterolateral leads in V5 and V6.  Due to that, he was transferred emergently to the cardiac catheterization lab.  Emergent cardiac catheterization was indicated.  I discussed with him the significant risk of contrast-induced nephropathy.  However, the benefit in this situation outweighs the risk due to ST elevation myocardial infarction.  Risks,  benefits, and alternatives were discussed with him. He also has previous history of GI bleed in 2010 and thus I elected to use a bare-metal stent.  ACCESS:  Right femoral artery.  FINDINGS:  Hemodynamic findings:  Left ventricular pressure was 123/14 with a left ventricular end-diastolic pressure was 28 mmHg.  Aortic pressure was 135/96 with a mean pressure of 116 mmHg.  CORONARY ANGIOGRAPHY:   Right coronary artery:  The vessel was large and dominant.  There is a stent noted in the proximal segment with only minor 20% in-stent restenosis in the proximal segment.  In the mid segment, there is a 10% diffuse disease.  Distally, there is a 30-40% stenosis. The rest of the vessel is free of significant disease.  There is a large- sized PDA and normal-sized PL branches which are free of significant disease.  Left main coronary artery:  The vessel was normal in size with mild 10% distal stenosis.  Left circumflex artery:  The vessel is overall medium size.  There is 20% proximal stenosis.  First OM is very small in size.  Second OM is anormal-sized branch with what seems to be a stent in the proximal segment.  There is 20% in-stent restenosis.  OM-3 is very  small in size.  Left anterior descending artery:  The vessel was overall large in size. Proximally, there is a 20% stenosis.  In the mid segment, there is a 40% stenosis right after the first septal perforator.  Another lesion after that appears to be a plaque rupture with 100% stenosis which was felt to be the culprit for his ST elevation MI.  There is a medium-sized diagonal branch right at the site of the plaque rupture with a 90% ostial stenosis.  The branch is overall medium in size.  The rest of the LAD wraps around the apex and actually wraps around the lateral wall as well and that probably responsible for the lateral EKG changes.  INTERVENTIONAL PROCEDURE NOTE:  The patient was already started on IV bivalirudin renal dose  and was also given 2000 units of unfractionated heparin.  ACT was therapeutic.  He was already given aspirin and was given 600 mg of Plavix in the cath lab.  An XB3.5 guiding catheter was used.  The lesion was wired with a Prowater wire with some difficulty initially.  Thrombectomy was performed with an Export catheter with 1 pass.  This established a TIMI-3 flow.  The lesion was predilated with 2.5 x 15 mm Apex balloon to 10 atmospheres.  I then placed a 3.5 x 28 mm Vision stent which was deployed to 14 atmospheres.  This jailed a medium- sized diagonal branch as the plaque rupture was at that site.  It was noted after the stent deployment that there was no flow in the diagonal branch.  The stent was postdilated with a 3.75 x 20 mm [place] TREK balloon to 16 atmospheres.  Angiography showed excellent results.  There was still no flow in the diagonal branch.  I tried to open a small channel with the wire to that branch but decided not to push aggressively in order not to distort the stent in the LAD and also due to limitations with his advanced chronic kidney disease that does not allow too much contrast use.  Most of the EKG changes has resolved except for persistence of lateral ST elevation.  The guiding catheter and the wire were removed.  The site was closed with a Perclose device.  The patient tolerated the procedure well with no immediate complications.  He was still having 3/10 chest pain at the end of the procedure as well as like I said persistence of lateral ST elevation, likely due to some slow flow and also occlusion in the medium-sized diagonal branch.  STUDY CONCLUSIONS: 1. Anterolateral ST elevation myocardial infarction due to 100%     thrombotic occlusion in the mid left anterior descending coronary     artery due to plaque rupture. 2. Successful angioplasty, thrombectomy, and a bare-metal stent     placement to the mid left anterior descending coronary artery. 3. Moderately  elevated left ventricular end-diastolic pressure.  RECOMMENDATIONS:  I recommended aspirin daily indefinitely as well as Plavix 75 mg once daily for a minimal of 3 months and ideally for 1 year.  I elected to use a bare-metal stent due to his previous history of GI bleed. 1. Transthoracic echocardiogram to evaluate his ejection fraction. 2. Continue IV fluids for renal protection. 3. Nephrology consultation is recommended.  Smoking cessation is     strongly advised.     Kathlyn Sacramento, MD     MA/MEDQ  D:  08/02/2010  T:  08/03/2010  Job:  [id number]  Electronically Signed by Kathlyn Sacramento MD on  08/16/2010 03:31:40 PM

## 2010-08-17 LAB — BASIC METABOLIC PANEL
BUN: 45 mg/dL — ABNORMAL HIGH (ref 6–23)
BUN: 37 mg/dL — ABNORMAL HIGH (ref 6–23)
BUN: 40 mg/dL — ABNORMAL HIGH (ref 6–23)
BUN: 42 mg/dL — ABNORMAL HIGH (ref 6–23)
CO2: 24 mEq/L (ref 19–32)
Calcium: 8.1 mg/dL — ABNORMAL LOW (ref 8.4–10.5)
Calcium: 8.4 mg/dL (ref 8.4–10.5)
Chloride: 108 mEq/L (ref 96–112)
Chloride: 104 mEq/L (ref 96–112)
Chloride: 106 mEq/L (ref 96–112)
Creatinine, Ser: 2.81 mg/dL — ABNORMAL HIGH (ref 0.4–1.5)
Creatinine, Ser: 2.99 mg/dL — ABNORMAL HIGH (ref 0.4–1.5)
Creatinine, Ser: 3.34 mg/dL — ABNORMAL HIGH (ref 0.4–1.5)
GFR calc Af Amer: 28 mL/min — ABNORMAL LOW (ref >60)
GFR calc non Af Amer: 25 mL/min — ABNORMAL LOW (ref >60)
GFR calc non Af Amer: 20 mL/min — ABNORMAL LOW (ref >60)
Glucose, Bld: 141 mg/dL — ABNORMAL HIGH (ref 70–99)
Glucose, Bld: 102 mg/dL — ABNORMAL HIGH (ref 70–99)
Glucose, Bld: 141 mg/dL — ABNORMAL HIGH (ref 70–99)
Potassium: 4.2 mEq/L (ref 3.5–5.1)
Potassium: 5 mEq/L (ref 3.5–5.1)

## 2010-08-17 LAB — CBC
HCT: 27.8 % — ABNORMAL LOW (ref 39.0–52.0)
HCT: 46.8 % (ref 39.0–52.0)
MCHC: 33.8 g/dL (ref 30.0–36.0)
MCHC: 34 g/dL (ref 30.0–36.0)
MCV: 99.6 fL (ref 78.0–100.0)
MCV: 101.1 fL — ABNORMAL HIGH (ref 78.0–100.0)
MCV: 101.1 fL — ABNORMAL HIGH (ref 78.0–100.0)
Platelets: 149 10*3/uL — ABNORMAL LOW (ref 150–400)
Platelets: 151 10*3/uL (ref 150–400)
Platelets: 181 10*3/uL (ref 150–400)
Platelets: 207 10*3/uL (ref 150–400)
RDW: 13.8 % (ref 11.5–15.5)
RDW: 14.2 % (ref 11.5–15.5)
RDW: 14.4 % (ref 11.5–15.5)
WBC: 11.6 10*3/uL — ABNORMAL HIGH (ref 4.0–10.5)

## 2010-08-17 LAB — DIFFERENTIAL
Basophils Absolute: 0 10*3/uL (ref 0.0–0.1)
Basophils Relative: 1 % (ref 0–1)
Eosinophils Relative: 6 % — ABNORMAL HIGH (ref 0–5)
Lymphocytes Relative: 27 % (ref 12–46)
Monocytes Absolute: 0.8 10*3/uL (ref 0.1–1.0)
Neutro Abs: 6.3 10*3/uL (ref 1.7–7.7)

## 2010-08-17 LAB — PROTIME-INR
INR: 1.15 (ref 0.00–1.49)
INR: 2.03 — ABNORMAL HIGH (ref 0.00–1.49)
Prothrombin Time: 12.4 seconds (ref 11.6–15.2)
Prothrombin Time: 14.6 seconds (ref 11.6–15.2)
Prothrombin Time: 22.8 seconds — ABNORMAL HIGH (ref 11.6–15.2)

## 2010-08-17 LAB — TYPE AND SCREEN
ABO/RH(D): O POS
Antibody Screen: NEGATIVE

## 2010-08-17 LAB — URINALYSIS, ROUTINE W REFLEX MICROSCOPIC
Bilirubin Urine: NEGATIVE
Glucose, UA: NEGATIVE mg/dL
Ketones, ur: NEGATIVE mg/dL
Leukocytes, UA: NEGATIVE
Protein, ur: 100 mg/dL — AB
pH: 5.5 (ref 5.0–8.0)

## 2010-08-17 LAB — URINE MICROSCOPIC-ADD ON

## 2010-08-17 LAB — APTT: aPTT: 26 seconds (ref 24–37)

## 2010-08-22 NOTE — Consult Note (Signed)
[name], SULLENDER NO.:  [id number]  MEDICAL RECORD NO.:  [id number]           Dyer TYPE:  I  LOCATION:  2909                         FACILITY:  [facility]  PHYSICIAN:  Windy Kalata, M.D.DATE OF BIRTH:  10/10/1965  DATE OF CONSULTATION:  08/02/2010 DATE OF DISCHARGE:                                CONSULTATION   REASON FOR CONSULTATION:  Acute on chronic kidney injury in [facility] setting of systemic lupus erythematosus and acute coronary syndrome.  HISTORY OF PRESENT ILLNESS:  This is a 45 year old very pleasant man who is admitted with a STEMI on August 02, 2010.  Roy Dyer was taken to [facility] cath lab for angioplasty and angiography with a bare-metal stent placement.  Roy Dyer has a history of recent hospital admission for a non-STEMI at [facility] end of February 2012 and was discharged from [facility] hospital on July 18, 2010.  Roy Dyer also has a longstanding history of systemic lupus erythematosus since age 21 years old.  Dyer is being followed by Dr. Moshe Cipro at [facility].  Roy Dyer was evaluated by Dr. Moshe Cipro 2 weeks prior to this admission.  At that time, he found that Roy Dyer had herpes zoster infection and was treated with a 5-day course of oral acyclovir.  PAST MEDICAL HISTORY: 1. ST elevation MI.  He underwent a left heart cath with coronary     angioplasty of LAD with a bare-metal stent placement. 2. Non-ST elevation MI with elective scan Myoview performed on     July 06, 2010, that demonstrated ejection fraction of 55%. 3. Coronary artery disease, status post stent placement in [facility] RCA and     left circumflex in 2001. 4. Systemic lupus erythematosus with history of chronic steroid use in     1990s.  Diagnosed at age of 45 years old.  Underwent 3 renal     biopsies with Roy most recent one in 2009.  Results of renal biopsy     is unknown. 5. Chronic kidney disease with creatinine baseline at 2.5. 6.  Hypertension. 7. Hyperlipidemia. 8. Osteopenia, steroid induced. 9. Left hip arthroplasty. 10.Tobacco abuse.  ALLERGIES:  NO KNOWN DRUG ALLERGIES.  HOME MEDICATIONS: 1. Aspirin 325 mg p.o. daily. 2. Lasix 40 mg p.o. daily. 3. Lisinopril 10 mg p.o. daily. 4. Metoprolol 100 mg p.o. b.i.d. 5. Allopurinol 300 mg p.o. daily. 6. Nifedipine 10 mg p.o. daily. 7. Nexium 40 mg p.o. daily.  MEDICATIONS WHILE IN [facility]: 1. Prinivil 10 mg p.o. daily. 2. Imdur 15 mg half tablet p.o. daily. 3. Procardia 10 mg p.o. daily. 4. Lopressor 100 mg p.o. b.i.d. 5. Crestor 40 mg one p.o. nightly. 6. Aspirin 325 mg p.o. daily. 7. Nexium 40 mg p.o. daily. 8. Lasix 40 mg p.o. daily. 9. Plavix 75 mg p.o. daily. 10.Zyloprim 300 mg p.o. daily. 11.Niaspan 500 mg p.o. daily. 12.Normal saline at 125 mL an hour. 13.Heparin drip.  SOCIAL HISTORY:  Roy Dyer lives at home.  He is married.  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  Roy Dyer smokes half of a pack per year for about 20 years.  Denies any use of alcohol or drug use.  FAMILY MEDICAL HISTORY:  Mother with systemic lupus erythematosus and renal failure.  Father with coronary artery disease.  REVIEW OF SYSTEMS:  Roy Dyer denies any fever, chills, sweats, adenopathy.  Endorses fatigue.  Denies any headache, bleeding or vertigo.  Roy Dyer states that his shingles rash on her upper back pain is improving.  Roy Dyer endorses chest tightness, shortness of breath.  Denies any polyuria, heat or cold intolerance, frequency, urgency, dysuria, hematuria, nocturia.  Denies any numbness, any arthralgias, joint swelling.  Denies any nausea, vomiting, diarrhea, hematochezia or hematemesis.  PHYSICAL EXAMINATION:  VITAL SIGNS:  Temperature of 98.1, pulse rate of 90, respiratory rate of 19, blood pressure 109/77, saturating 96% on room air.  Weight 102.4 kg.  I's and O's 865/700 with a net of positive 165. GENERAL:  Roy Dyer is resting  comfortably, alert and oriented x3, in not apparent distress. HEENT:  Head is without any visible lesions.  EOMs are intact bilaterally.  PERRLA bilaterally.  No scleral pallor or icterus bilaterally. Oropharynx with moist mucous membranes.  No lesions.  Uvula midline. NECK:  Neck is short and thick.  Supple.  No JVDs, no bruits bilaterally.  No thyromegaly bilaterally.  No lymphadenopathy bilaterally. CARDIOVASCULAR:  Regular rate and rhythm without any murmurs, rubs or gallops.  Pedal pulses 2+/4 bilaterally. LUNGS:  Diminished breath sounds to lower lung fields bilaterally due to poor inspiratory effort, otherwise no wheezes, crackles. SKIN:  There are herpetic dried up lesion to Roy back left or upper back approximately 4 cm in diameter. GI:  Abdomen is nondistended.  Bowel sounds present, nontender to palpation and no hepatomegaly.  No rebound or guarding present. GU:  Normal genitalia without any lesions. EXTREMITIES:  No clubbing, cyanosis, or edema bilaterally.  No petechiae bilaterally. ACCESS:  Roy Dyer does have peripheral IV line. MUSCULOSKELETAL:  No joint deformities or effusions.  No spine tenderness. NEUROLOGIC:  Grossly intact without asterixis.  Chest x-ray demonstrates pulmonary vascular congestion.  LABORATORY DATA:  Sodium of 141, potassium of 4.7, chloride 115, bicarb of 16, anion gap of 10, BUN of 40, creatinine of 2.7, glucose of 158.  White blood count of 10.5, hemoglobin of 15.1, platelet count of 130,000.  Magnesium of 1.4.  INR of 1.3.  Troponin is more than 100.  CPK-MB 3300.  ASSESSMENT/PLAN:  Mr. Hellwege is a 45 year old gentleman with an extensive history of coronary artery disease and a systemic lupus erythematosus, admitted with acute on chronic kidney injury.  Roy Dyer has a known stage III kidney disease.  Dyer's creatinine is actually better than prior recorded creatinine values per E-chart to review.  Roy Dyer does have a  history of systemic lupus erythematosus with renal biopsy x3, however, those results are unavailable at this time.  Roy Dyer is being followed by Dr. Moshe Cipro on an outpatient basis.  We will request her office visit notes and review them along with Dr. Mercy Moore. I agree with Roy current regimen of Lasix.  However, will saline lock IV fluids.  Discontinue ACE inhibitor.  We will place him on renal diet and follow strict I's and O's.  We also will order C3, C4 sedimentation rate, and double stranded DNA workup. 1. ST elevation myocardial infarction, status post angioplasty with a     bare-metal stent placement in Roy LAD.  Roy Dyer is being     followed by Cardiology Service.  We will continue with current  regimen with an exception for an ACE inhibitor. 2. Recent history of shingles.  No signs of active flare.  Therefore,     there is no need for contact precautions or continuation of therapy     with acyclovir. 3. History of tobacco abuse.  Roy Dyer has been counseled.     Milana Obey, MD   ______________________________ Windy Kalata, M.D.    NK/MEDQ  D:  08/03/2010  T:  08/04/2010  Job:  [id number]  Electronically Signed by Milana Obey  on 08/20/2010 10:05:07 AM Electronically Signed by Fleet Contras M.D. on 08/22/2010 08:13:55 PM

## 2010-08-22 NOTE — Discharge Summary (Signed)
NAMESAKARI, WOFFORD NO.:  [id number]  MEDICAL RECORD NO.:  [id number]           PATIENT TYPE:  I  LOCATION:  [id number]                         FACILITY:  [place]  PHYSICIAN:  Wallis Bamberg. Johnsie Cancel, MD, FACCDATE OF BIRTH:  1966-03-18  DATE OF ADMISSION:  08/02/2010 DATE OF DISCHARGE:  08/07/2010                              DISCHARGE SUMMARY   PRIMARY CARDIOLOGIST:  Collier Salina C. Johnsie Cancel, MD, [facility]  ELECTROPHYSIOLOGIST:  Deboraha Sprang, MD, [facility]  INTERVENTIONAL CARDIOLOGIST:  Kathlyn Sacramento, MD  CONSULTING NEPHROLOGIST:  Windy Kalata, MD  NEPHROLOGIST:  Louis Meckel, MD ([place]).  DISCHARGE DIAGNOSES: 1. Anterolateral ST elevation myocardial infarction.     a.     Cardiac catheterization, August 02, 2010:  A 100% thrombotic      occlusion of the mid left anterior descending coronary artery      secondary to plaque rupture.  Successful angioplasty,      thrombectomy, and percutaneous coronary intervention with 3.5 x 28      mm Vision stent to mid left anterior descending coronary artery.      Moderately elevated left ventricular end-diastolic pressure.  Dual      antiplatelet therapy (aspirin 325 mg p.o. daily and Plavix 75 mg      p.o. daily) for greater than or equal to 3 months with preference      for at least 1 year. 2. Ischemic cardiomyopathy, left ventricular ejection fraction 30%.     a.     A 2D echocardiogram, August 02, 2010:  Left ventricular      cavity size normal, wall thickness normal, moderately-to-severely      reduced left ventricular ejection fraction equal to 30%, mid-to-      apical anteroseptal and mid-to-apical anterior akinesis, akinesis      of the true apex, apical inferior segment, and apical lateral      segment.  Grade 1 diastolic dysfunction.  Left atrium mildly      dilated.  RV cavity size normal with normal systolic function. 3. Acute systolic heart failure.     a.     Responded to Lasix, weight on  August 03, 2010, 102.4 kg and      weight on August 06, 2010, 107 kg. 4. Acute on chronic renal insufficiency (chronic kidney disease stage     IV, baseline creatinine 2.9, likely secondary to contrast.     a.     Peak creatinine this admission 3.29 with BUN of 33 on August 06, 2010.  Peak BUN is 40 with creatinine of 2.7 on August 02, 2010.     b.     Stable on discharge with BUN of 33 and creatinine of 3.18. 5. Ongoing tobacco abuse.     a.     Extensive education/instruction as to the importance of      tobacco cessation this [facility] from [facility] staff as      well as from Cardiac Rehab staff. 6. Hypomagnesemia (magnesium 1.4).  SECONDARY DIAGNOSES: 1. Coronary  artery disease.     a.     Stent to all caps right coronary artery and left circumflex,      2001.     b.     Cardiac cath in 2005, with no significant findings.     c.     Negative Myoview on July 06, 2010. 2. Hypertension (mostly hypotensive this admission). 3. Hyperlipidemia. 4. Systemic lupus erythematous, chronic steroids since 1990s.     a.     S/P renal biopsies x3, last one in 2009 (results      unavailable). 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 6. Gastroesophageal reflux disease. 7. S/P left hip surgery (active right hip osteoarthritis).  ALLERGIES:  NKDA.  PROCEDURES: 1. EKG, August 02, 2010:  ST elevation of 2 mm in aVL with 1 mm of ST     elevation in leads II and III with marked T-wave inversion in lead     III and less pronounced in aVF, also T-wave inversion in V1,     minimal ST elevation in V2.  No obviously significant Q-waves, but     a lot of artifact.  ST changes new from tracing completed on     July 03, 2010. 2. Cardiac catheterization, August 02, 2010:  RCA large dominant vessel     with stent noted in the proximal segment, minor 20% in-stent     restenosis, 10% midvessel distally 30-40%.  Large PDA and other     branches free of significant disease.  LM normal size  with mild 10%     distal stenosis.  Left circumflex overall medium sized with 20%     proximal stenosis, OM-1 very small in size, OM-2 normal size,     question stent in proximal segment with 20% in-stent restenosis, OM-     3 very small.  LAD large vessel with 20% proximal stenosis, mid     segment 40% right after the first septal perforator and than what     appears to be a ruptured plaque with 100% stenosis.  Medium     diagonal branch right at the site of the plaque rupture with 90%     ostial stenosis, medium sized vessel.  LAD wraps around the apex     and actually wraps around lateral wall as well. 3. S/P PTCA and PCI with BMS as described in discharge diagnoses     section #1, subsection A. 4. Chest x-ray, August 02, 2010:  Low lung volumes, no acute findings. 5. A 2D echocardiogram, August 02, 2010:  Please see discharge     diagnoses section under ischemic cardiomyopathy. 6. Chest x-ray, August 03, 2010:  Diminished aeration with question of     mild pulmonary vascular congestion.  Probable right basilar     atelectasis. 7. Chest x-ray, August 05, 2010:  Low lung volumes.  No acute findings.  HISTORY OF PRESENT ILLNESS:  Roy Dyer is a 45 year old Caucasian gentleman with the above-noted complex medical history, who presented to [facility] by EMS after he was awakened with substernal chest discomfort, identical to his prior angina, subsequently found to have ST elevation on initial tracing completed by EMS.  The patient was in his usual state of health when he was awakened with severe substernal chest discomfort that he described as identical to his prior angina.  He took 4 baby aspirin, 2 sublingual nitroglycerin, and when his symptoms did not resolve, he called EMS.  EMS completed EKG which showed ST elevation and  a code STEMI was called, and the patient was transferred emergently to [facility] and taken directly to the cardiac cath lab.  En route, the patient was given  6 mg of morphine, another sublingual nitroglycerin, 4 mg of Zofran, and 500 mL of normal saline bolus.  The patient's blood pressure was stable with systolic in the [id number] during transit.  When evaluated in the cardiac cath lab, the patient continued to have mild-to-moderate chest discomfort.  HOSPITAL COURSE:  The patient was admitted and underwent procedures as described above.  He tolerated them well without significant complication other than the question of acute on chronic renal insufficiency secondary to contrast.  The patient did not have an LV gram in deference to his chronic renal insufficiency.  Nephrology was consulted and recommendations appreciated and followed.  While the patient did have elevated creatinine postcath, stable with peak of 3.29 down to 3.18 on discharge (see discharge diagnoses section).  The patient had no further chest pain during the rest of his admission and continued to work with Cardiac Rehab, ambulating further and further stay with minimal symptoms of dyspnea.  The patient was noted to have significantly depressed LV systolic function on 2D echocardiogram and LifeVest was recommended.  He was evaluated by Electrophysiology, Dr. Virl Axe, who agreed with this plan.  He was then set up for LifeVest by [employer] rep prior to leaving the hospital.  Medications were adjusted due to renal insufficiency and hypotension.  This include discontinuation of his Lasix therapy, although he did require Lasix inpatient for mild volume overload post cath in the setting of decreased LVEF.  The patient also had his ACE inhibitor therapy discontinued at this time.  Beta-blockade therapy decreased to Coreg 3.125 mg p.o. b.i.d.  The patient did have long-acting nitrates increased to Imdur 30 mg p.o. daily and hydralazine 10 mg p.o. b.i.d.  The patient will have dual antiplatelet therapy on full-strength aspirin and Plavix for at least 3 months but this will be reviewed by  his primary cardiologist in followup which was set up for August 30, 2010, at 3:30 p.m. at [facility] office.  Interventional cardiologist, Kathlyn Sacramento, recommended at least 3 months but up to hopefully a 1 year of dual antiplatelet therapy in the setting of his acute anterolateral STEMI and subsequent PCI with BMS.  The patient will also have his LV function reevaluated on a followup 2D echocardiogram that was scheduled on September 04, 2010, at 9:30 a.m. per recommendations from electrophysiologist, Dr. Virl Axe.  In the interim, he will be wearing his LifeVest set up by [employer] and per recommendations by his primary cardiologist and electrophysiologist.  It is noteworthy that the Renal Service signed off on date of discharge as they had no further recommendations with the patient's renal function being stable, although slightly elevated creatinine from baseline.  At the time of discharge, the patient was given his new medication list, prescriptions, followup instructions, and post-cath instructions.  All questions or concerns were addressed prior to leaving the hospital.  DISCHARGE LABS:  WBC is 6.9, HGB 13.8, HCT 39.5, PLT count is 130. Sodium 135, potassium 4.7, chloride 103, bicarb 22, BUN 33, creatinine 3.18, glucose 105, calcium is 8.7, phosphorus 3.5.  Peak cardiac enzymes on initial set with peak CK on initial set equals to 3494, peak MB on second set equals to 368.1 with a relative index of 11.5, troponin greater than 100 on all 3 full sets checked.  Intact parathyroid hormone 125.2.  Total PTH calcium 8.3, C3 complement 85, C4 complement 60.  FOLLOWUP PLANS AND APPOINTMENTS: 1. Dr. [name] at [facility] office, August 30, 2010, at 3:30 p.m. 2. A 2D echocardiogram at [facility] office, September 04, 2010, at 9:30 a.m.  DISCHARGE MEDICATIONS: 1. APAP 650 mg q.4 hours p.r.n. 2. Atorvastatin 8 mg p.o. at  bedtime. 3. Calcitriol 0.25 mcg capsule 1 tablet p.o. daily. 4. Carvedilol 3.125 mg p.o. b.i.d. with meals. 5. Clopidogrel/Plavix 75 mg 1 tablet p.o. daily with meal. 6. Fluticasone/Flovent 44 mcg 1 puff inhaled b.i.d. 7. Hydralazine 10 mg 1 tab p.o. b.i.d. 8. Imdur 30 mg 1 tablet p.o. daily. 9. Allopurinol 300 mg 1 tab p.o. daily. 10.Aspirin 325 mg 1 tablet p.o. daily. 11.Nexium 40 mg 1 capsule p.o. daily. 12.Niaspan 500 mg 2 tablets p.o. at bedtime. 13.Sublingual nitroglycerin 0.4 mg q.5 minutes up to 3 doses p.r.n.     for chest discomfort.  Duration of discharge encounter including physician time was 45 minutes.     [name], PAC   ______________________________ Wallis Bamberg. Johnsie Cancel, MD, [facility]    MS/MEDQ  D:  08/07/2010  T:  08/08/2010  Job:  [id number]  cc:   Deboraha Sprang, MD, [facility] Windy Kalata, M.D. Kathlyn Sacramento, MD Louis Meckel, M.D.  Electronically Signed by [name] PAC on 08/17/2010 02:40:33 PM Electronically Signed by [name] MD [facility] on 08/22/2010 12:03:07 PM

## 2010-08-30 ENCOUNTER — Ambulatory Visit (INDEPENDENT_AMBULATORY_CARE_PROVIDER_SITE_OTHER): Payer: Managed Care, Other (non HMO) | Admitting: Cardiovascular Disease

## 2010-08-30 ENCOUNTER — Encounter: Payer: Self-pay | Admitting: Cardiovascular Disease

## 2010-08-30 DIAGNOSIS — N189 Chronic kidney disease, unspecified: Secondary | ICD-10-CM

## 2010-08-30 DIAGNOSIS — I509 Heart failure, unspecified: Secondary | ICD-10-CM

## 2010-08-30 DIAGNOSIS — I2510 Atherosclerotic heart disease of native coronary artery without angina pectoris: Secondary | ICD-10-CM

## 2010-08-30 DIAGNOSIS — I10 Essential (primary) hypertension: Secondary | ICD-10-CM

## 2010-08-30 DIAGNOSIS — I219 Acute myocardial infarction, unspecified: Secondary | ICD-10-CM

## 2010-08-30 DIAGNOSIS — E782 Mixed hyperlipidemia: Secondary | ICD-10-CM

## 2010-08-30 MED ORDER — HYDRALAZINE HCL 10 MG PO TABS: ORAL_TABLET | ORAL | Status: DC

## 2010-08-30 MED ORDER — LISINOPRIL 20 MG PO TABS: 20.0000 mg | ORAL_TABLET | Freq: Every day | ORAL | Status: DC

## 2010-08-30 NOTE — Patient Instructions (Signed)
1. Your physician has requested that you have a cardiac MRI. Cardiac MRI uses a computer to create images of your heart as its beating, producing both still and moving pictures of your heart and major blood vessels. For further information please visit [email or web address] Please follow the instruction sheet given to you today for more information.SCHEDULE FOR THE 1ST WEEK OF MAY 2.  Your physician recommends that you schedule a follow-up appointment in: [name] as soon as possible after MRI 3.  Your physician recommends that you schedule a follow-up appointment in: 3 months with Dr. Johnsie Cancel 4.  Your physician has recommended you make the following change in your medication:  INCREASE: hydralazine to 3 times a day.  Also, Increase Lisinopril to 20 mg a day.

## 2010-08-30 NOTE — Assessment & Plan Note (Signed)
MRI to assess size of MI and residual EF.  Increase ACE and consider increasing coreg when visiting [facility].  EPS F/U after MRI to discuss likely need for AICD  Continue wearing life vest

## 2010-08-30 NOTE — Assessment & Plan Note (Signed)
Increase ACE and hydralazine.  Consider increasing beta blocker during visit with [facility]

## 2010-08-30 NOTE — Progress Notes (Signed)
45 yo CRF from lupus.  Baseline Cr around 2.8.  SEMI in 2/12  Low risk myovue Rx medically due to renal failure.  Subsequent acute anterior MI 08/02/10.  EF 30% and D/C with life vest.  Back to work.  No SSCP dyspnea or syncope.  Denies palpitations.  BP been running high with multiple med changes on D/C.  Discussed need for 40 day repeat EF evaluation and need for AICD if 35% or lower.  Will schedule MRI and F/U visit with Dr [name] who saw him in hospital.  Also discussed adjustments in BP meds.  Cr peaked in hospital after cath and MI at 3.5 but back down to 2.7.  Primary renal doctor is [place]  ROS: Denies fever, malais, weight loss, blurry vision, decreased visual acuity, cough, sputum, SOB, hemoptysis, pleuritic pain, palpitaitons, heartburn, abdominal pain, melena, lower extremity edema, claudication, or rash.   General: Affect appropriate Healthy:  appears stated age 45: normal Neck supple with no adenopathy JVP normal no bruits no thyromegaly Lungs clear with no wheezing and good diaphragmatic motion Heart:  S1/S2 no murmur,rub, gallop or click PMI normal Abdomen: benighn, BS positve, no tenderness, no AAA no bruit.  No HSM or HJR Distal pulses intact with no bruits No edema Neuro non-focal Skin warm and dry No muscular weakness   Current Outpatient Prescriptions  Medication Sig Dispense Refill  . allopurinol (ZYLOPRIM) 300 MG tablet Take 300 mg by mouth daily.        [name] aspirin 325 MG tablet Take 325 mg by mouth daily.        [name] atorvastatin (LIPITOR) 80 MG tablet Take 80 mg by mouth daily.        . calcitRIOL (ROCALTROL) 0.25 MCG capsule Take 0.25 mcg by mouth daily.        . carvedilol (COREG) 3.125 MG tablet Take 3.125 mg by mouth 2 (two) times daily with a meal.        . clopidogrel (PLAVIX) 75 MG tablet Take 75 mg by mouth daily.        [name] esomeprazole (NEXIUM) 40 MG capsule Take 40 mg by mouth daily before breakfast.        . fluticasone (FLOVENT HFA) 44 MCG/ACT  inhaler Inhale 1 puff into the lungs 2 (two) times daily.        . hydrALAZINE (APRESOLINE) 10 MG tablet 1 tablet three times a day  90 tablet  6  . isosorbide mononitrate (IMDUR) 30 MG CR tablet Take 30 mg by mouth every morning.        . niacin (NIASPAN) 500 MG CR tablet 2 tabs po qhs       . DISCONTD: hydrALAZINE (APRESOLINE) 10 MG tablet Take 10 mg by mouth 2 (two) times daily.        [name] lisinopril (PRINIVIL,ZESTRIL) 20 MG tablet Take 1 tablet (20 mg total) by mouth daily.  30 tablet  11  . DISCONTD: atorvastatin (LIPITOR) 20 MG tablet Take 20 mg by mouth daily.        [name] DISCONTD: furosemide (LASIX) 40 MG tablet Take 40 mg by mouth 2 (two) times daily.        [name] DISCONTD: lisinopril (PRINIVIL,ZESTRIL) 10 MG tablet Take 10 mg by mouth daily.        [name] DISCONTD: metoprolol (LOPRESSOR) 100 MG tablet Take 100 mg by mouth 2 (two) times daily.        [name] DISCONTD: NIFEdipine (PROCARDIA) 10 MG capsule Take 10 mg by  mouth 2 (two) times daily.          Allergies  Review of patient's allergies indicates no known allergies.  Electrocardiogram:  NSR 90 Occ PVC LAE anterior MI  Assessment and Plan

## 2010-08-30 NOTE — Assessment & Plan Note (Signed)
F/U Dr Lenell Antu.  Proteinuria significant but Cr back to baseline

## 2010-08-30 NOTE — Assessment & Plan Note (Signed)
Cholesterol is at goal.  Continue current dose of statin and diet Rx.  No myalgias or side effects.  F/U  LFT's in 6 months. Lab Results  Component Value Date   LDLCALC  Value: 55        Total Cholesterol/HDL:CHD Risk Coronary Heart Disease Risk Table                     Men   Women  1/2 Average Risk   3.4   3.3  Average Risk       5.0   4.4  2 X Average Risk   9.6   7.1  3 X Average Risk  23.4   11.0        Use the calculated Patient Ratio above and the CHD Risk Table to determine the patient's CHD Risk.        ATP III CLASSIFICATION (LDL):  <100     mg/dL   Optimal  100-129  mg/dL   Near or Above                    Optimal  130-159  mg/dL   Borderline  160-189  mg/dL   High  >190     mg/dL   Very High 07/04/2010

## 2010-09-04 ENCOUNTER — Other Ambulatory Visit (HOSPITAL_COMMUNITY): Payer: Managed Care, Other (non HMO) | Admitting: Radiology

## 2010-09-04 ENCOUNTER — Other Ambulatory Visit: Payer: Self-pay | Admitting: Cardiovascular Disease

## 2010-09-07 ENCOUNTER — Other Ambulatory Visit: Payer: Self-pay | Admitting: Cardiovascular Disease

## 2010-09-07 ENCOUNTER — Encounter: Payer: Self-pay | Admitting: Cardiovascular Disease

## 2010-09-07 DIAGNOSIS — Z0189 Encounter for other specified special examinations: Secondary | ICD-10-CM

## 2010-09-07 NOTE — H&P (Signed)
NAMEMICHAELANGELO, ECCHER NO.:  [id number]  MEDICAL RECORD NO.:  [id number]           PATIENT TYPE:  I  LOCATION:  2909                         FACILITY:  [place]  PHYSICIAN:  Kathlyn Sacramento, MD     DATE OF BIRTH:  [birth year]  DATE OF ADMISSION:  08/02/2010 DATE OF DISCHARGE:                             HISTORY & PHYSICAL   PRIMARY CARDIOLOGIST:  Collier Salina C. Johnsie Cancel, MD, [facility]  CHIEF COMPLAINT:  Chest discomfort.  HISTORY OF PRESENT ILLNESS:  Mr. Harper in a 45 year old Caucasian male with a known history of coronary artery disease having had stents placed in his right coronary artery and left circumflex in 2001.  Last cath in 2005 without significant findings and a negative Myoview as recently as July 06, 2010, who now presents with sudden onset of chest discomfort that woke him this morning, subsequently found to have ST elevation when EMS performed initial EKG.  The patient was in his usual state of health until this morning when he was awakened with substernal chest pain.  He describes pressure and exactly the same as his prior angina.  He took four baby aspirin and two sublingual nitroglycerins and called EMS when his symptoms did not resolve.  Initial EKG completed by EMS showed ST elevation in I, II, and aVL as well as minimal ST-elevation in leads V2 and V3 with T-wave inversion in III and aVF.  All of these are new changes compared to prior tracing.  Of note, the patient did also have nausea and vomiting x1 as well as diaphoresis prior to EMS arrival.  The patient was taken emergently to [facility] and a code STEMI was called.  He was taken directly to the cardiac cath lab.  En route, the patient was also given 6 mg morphine, one more sublingual nitroglycerin, and 4 mg of Zofran as well as a 500 mL bolus.  The patient's blood pressure was stable in the 130s during this entire transit.  PAST MEDICAL HISTORY: 1. CAD.     a.     Stents to RCA and left  circumflex, 2001.     b.     Cardiac cath in 2005, no significant findings.     c.     Negative Myoview on July 06, 2010/ 2. Ongoing tobacco abuse (one half ppd x25 years). 3. Hypertension. 4. Hyperlipidemia. 5. CKD (baseline creatinine 2.9). 6. 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 8. Obesity. 9. GERD. 10.S/P left hip surgery (active right hip osteoarthritis).  SOCIAL HISTORY:  The patient lives at home and works in the [employer].  He continues to smoke as noted in past medical history.  Negative EtOH or illicit drug use.  Regular diet.  No regular exercise but active in daily life.  FAMILY HISTORY:  Premature diagnosis of CAD in his father who had MI at age 47, otherwise no significant family history.  REVIEW OF SYSTEMS:  Please see HPI.  All other systems reviewed were negative.  CODE STATUS:  Full.  ALLERGIES:  NKDA.  MEDICATIONS: 1. Allopurinol 300 mg 1 tablet p.o. daily. 2. Enteric-coated  aspirin 325 mg p.o. daily. 3. Lasix 40 mg p.o. daily. 4. Lipitor 20 mg one tablet p.o. daily. 5. Lisinopril 10 mg p.o. daily. 6. Metoprolol tartrate 100 mg p.o. b.i.d. 7. Nexium 40 mg 1 capsule p.o. daily. 8. Niaspan 1 g p.o. at bedtime. 9. Nifedipine 10 mg 1 tablet p.o. daily. 10.Imdur 30 mg one-half tablet p.o. daily.  PHYSICAL EXAMINATION:  VITAL SIGNS:  Temperature is pending, pulse in the 90s-150s, currently in the 90s, respiration rate in the 10s, blood pressure ranging 130s/60s, O2 saturation is 100% on 2 L by nasal cannula. GENERAL:  The patient is alert and oriented x3.  He is in only mild distress in no respiratory distress. HEENT:  Head is normocephalic, atraumatic.  Pupils equal, round, reactive to light.  Extraocular muscles are intact.  Nares are patent without discharge.  Dentition is fair.  Oropharynx without erythema or exudates. NECK:  Supple without lymphadenopathy.  No thyromegaly.  No JVD. HEART:  Rate is regular  with audible S1 and S2.  No clicks, rubs, murmurs, or gallops.  Pulses are 2+ equal in both upper and lower extremities bilaterally. LUNGS:  Clear to auscultation bilaterally. SKIN:  No rashes, lesions, or petechiae. ABDOMEN:  Soft, nontender, nondistended.  Normal abdominal bowel sounds. No rebound or guarding.  No hepatosplenomegaly.  The patient is obese. EXTREMITIES:  Without clubbing, cyanosis, or edema. MUSCULOSKELETAL:  Without joint deformity or effusions.  No spinal or CVA tenderness. NEUROLOGIC:  Cranial nerves II through XII grossly intact.  Strength 5/5 in all extremities and axillary groups.  Normal sensation throughout. Normal cerebellar function.  RADIOLOGY:  Chest x-ray pending.  EKG:  Rhythm NSR, 80 bpm, ST elevation of 2 mm in aVL with 1 mm of ST elevation in leads II and marked T-wave inversion in lead III and less marked in aVF, also T-wave inversion in V1, minimal ST elevation in lead V2, otherwise no significant ST changes.  No obviously significant Q-waves, but tracing has a lot of artifact.  These ST changes are new from tracing completed on July 03, 2010.  LABORATORY DATA:  Pending.  ASSESSMENT AND PLAN:  The patient was seen by [name], PA-C, and interventional cardiologist, Dr. Kathlyn Sacramento.  Mr. Clemenson is a 45 year old Caucasian gentleman with the above-noted known history of coronary artery disease as well as ongoing tobacco abuse, hypertension, hyperlipidemia, and unfortunately significant chronic kidney disease with baseline creatinine of 2.9, who now presents with an inferolateral STEMI.  STEMI - the patient is currently undergoing emergent cardiac catheterization.  He will then be transferred to the intensive care unit and his home medications will be continued pending any changes postcath by his interventional cardiologist.  The patient will have all routine labs checked including three full sets of cardiac enzymes to monitor trend.   Otherwise, orders pending results of cardiac catheterization.     [name], PAC   ______________________________ Kathlyn Sacramento, MD    MS/MEDQ  D:  08/02/2010  T:  08/03/2010  Job:  [id number]  Electronically Signed by [name] PAC on 08/17/2010 02:41:07 PM Electronically Signed by Kathlyn Sacramento MD on 09/07/2010 09:43:47 AM

## 2010-09-12 ENCOUNTER — Emergency Department (INDEPENDENT_AMBULATORY_CARE_PROVIDER_SITE_OTHER): Payer: Managed Care, Other (non HMO)

## 2010-09-12 ENCOUNTER — Emergency Department (HOSPITAL_BASED_OUTPATIENT_CLINIC_OR_DEPARTMENT_OTHER)
Admission: EM | Admit: 2010-09-12 | Discharge: 2010-09-12 | Disposition: A | Discharge status: Home / Self Care | Payer: Managed Care, Other (non HMO) | Attending: Emergency Medicine | Admitting: Emergency Medicine

## 2010-09-12 DIAGNOSIS — I2510 Atherosclerotic heart disease of native coronary artery without angina pectoris: Secondary | ICD-10-CM | POA: Insufficient documentation

## 2010-09-12 DIAGNOSIS — Z79899 Other long term (current) drug therapy: Secondary | ICD-10-CM | POA: Insufficient documentation

## 2010-09-12 DIAGNOSIS — I10 Essential (primary) hypertension: Secondary | ICD-10-CM | POA: Insufficient documentation

## 2010-09-12 DIAGNOSIS — I252 Old myocardial infarction: Secondary | ICD-10-CM | POA: Insufficient documentation

## 2010-09-12 DIAGNOSIS — E7800 Pure hypercholesterolemia, unspecified: Secondary | ICD-10-CM | POA: Insufficient documentation

## 2010-09-12 DIAGNOSIS — K219 Gastro-esophageal reflux disease without esophagitis: Secondary | ICD-10-CM | POA: Insufficient documentation

## 2010-09-12 DIAGNOSIS — M7730 Calcaneal spur, unspecified foot: Secondary | ICD-10-CM | POA: Insufficient documentation

## 2010-09-12 DIAGNOSIS — M79609 Pain in unspecified limb: Secondary | ICD-10-CM

## 2010-09-12 DIAGNOSIS — M109 Gout, unspecified: Secondary | ICD-10-CM | POA: Insufficient documentation

## 2010-09-14 ENCOUNTER — Telehealth: Payer: Self-pay | Admitting: Cardiovascular Disease

## 2010-09-14 NOTE — Telephone Encounter (Signed)
[facility] lab/radiology calling for order for BUN,CREATININE for cardiac MRI--orders faxed to given number--nt

## 2010-09-14 NOTE — Telephone Encounter (Signed)
Requesting order/lab for  Cardiac mri to be faxed [phone]

## 2010-09-14 NOTE — Telephone Encounter (Signed)
Order faxed earlier today by nancy tirbeck, with confirmation it went through. No one answers the phone in radiology when I tried to call Roy Dyer

## 2010-09-14 NOTE — Telephone Encounter (Signed)
[facility] calling for lab orders for bun/creat today-please fax

## 2010-09-15 ENCOUNTER — Ambulatory Visit (HOSPITAL_COMMUNITY)
Admission: RE | Admit: 2010-09-15 | Discharge: 2010-09-15 | Disposition: A | Discharge status: Home / Self Care | Payer: Managed Care, Other (non HMO) | Source: Ambulatory Visit | Attending: Cardiovascular Disease | Admitting: Cardiovascular Disease

## 2010-09-15 ENCOUNTER — Telehealth: Payer: Self-pay | Admitting: *Deleted

## 2010-09-15 ENCOUNTER — Other Ambulatory Visit (HOSPITAL_COMMUNITY): Payer: Managed Care, Other (non HMO) | Admitting: Radiology

## 2010-09-15 ENCOUNTER — Other Ambulatory Visit: Payer: Self-pay | Admitting: Cardiovascular Disease

## 2010-09-15 DIAGNOSIS — I252 Old myocardial infarction: Secondary | ICD-10-CM | POA: Insufficient documentation

## 2010-09-15 DIAGNOSIS — I509 Heart failure, unspecified: Secondary | ICD-10-CM

## 2010-09-15 DIAGNOSIS — I8290 Acute embolism and thrombosis of unspecified vein: Secondary | ICD-10-CM

## 2010-09-15 LAB — CREATININE, SERUM: GFR calc Af Amer: 18 mL/min — ABNORMAL LOW (ref >60)

## 2010-09-15 MED ORDER — WARFARIN SODIUM 5 MG PO TABS: ORAL_TABLET | ORAL | Status: DC

## 2010-09-15 NOTE — Telephone Encounter (Signed)
I have called pt per Dr. Johnsie Cancel regarding clot in ventricle as seen on MRI today.  Pt will start Coumadin 10mg  today and Saturday. Sunday he will take Coumadin 5mg .  He is aware he need coumadin clinic visit on Monday and an ECHO.  A follow-up appt with Dr. Johnsie Cancel asap to discuss MRI will also be scheduled.  Pt is aware of all and states that he does have an appt at 11:15am with [facility] for abn creatinine. Pt has Coumadin appt 5/7 at 2:45 & ECHO at 3:00pm.  Follow-up appt with Dr. Johnsie Cancel 5/10 at 11:45am Horton Chin RN

## 2010-09-18 ENCOUNTER — Ambulatory Visit (HOSPITAL_COMMUNITY): Payer: Managed Care, Other (non HMO) | Attending: Cardiovascular Disease

## 2010-09-18 ENCOUNTER — Ambulatory Visit (INDEPENDENT_AMBULATORY_CARE_PROVIDER_SITE_OTHER): Payer: Managed Care, Other (non HMO) | Admitting: *Deleted

## 2010-09-18 ENCOUNTER — Encounter: Payer: Self-pay | Admitting: Cardiovascular Disease

## 2010-09-18 DIAGNOSIS — I2510 Atherosclerotic heart disease of native coronary artery without angina pectoris: Secondary | ICD-10-CM

## 2010-09-18 DIAGNOSIS — I8290 Acute embolism and thrombosis of unspecified vein: Secondary | ICD-10-CM

## 2010-09-18 DIAGNOSIS — I749 Embolism and thrombosis of unspecified artery: Secondary | ICD-10-CM

## 2010-09-18 DIAGNOSIS — I513 Intracardiac thrombosis, not elsewhere classified: Secondary | ICD-10-CM | POA: Insufficient documentation

## 2010-09-18 LAB — POCT INR: INR: 3.2

## 2010-09-21 ENCOUNTER — Encounter: Payer: Self-pay | Admitting: Cardiovascular Disease

## 2010-09-21 ENCOUNTER — Ambulatory Visit (INDEPENDENT_AMBULATORY_CARE_PROVIDER_SITE_OTHER): Payer: Managed Care, Other (non HMO) | Admitting: Cardiovascular Disease

## 2010-09-21 VITALS — BP 122/94 | HR 115 | Resp 12 | Wt 215.0 lb

## 2010-09-21 DIAGNOSIS — I749 Embolism and thrombosis of unspecified artery: Secondary | ICD-10-CM

## 2010-09-21 DIAGNOSIS — I10 Essential (primary) hypertension: Secondary | ICD-10-CM

## 2010-09-21 DIAGNOSIS — I219 Acute myocardial infarction, unspecified: Secondary | ICD-10-CM

## 2010-09-21 DIAGNOSIS — I509 Heart failure, unspecified: Secondary | ICD-10-CM

## 2010-09-21 DIAGNOSIS — E782 Mixed hyperlipidemia: Secondary | ICD-10-CM

## 2010-09-21 NOTE — Assessment & Plan Note (Signed)
Will likely need coumadin for a year.  Monitor for TIAs.  Will discuss need for any lovenox bridging with [facility] although I have a lot of concerns about pocket hematoma

## 2010-09-21 NOTE — Patient Instructions (Signed)
REFERRAL TO DR Jolyn Nap ASAP-DISCUSS ICD  Your physician recommends that you schedule a follow-up appointment in: [place] Johnsie Cancel

## 2010-09-21 NOTE — Assessment & Plan Note (Signed)
Well controlled.  Continue current medications and low sodium Dash type diet.    

## 2010-09-21 NOTE — Assessment & Plan Note (Signed)
Cholesterol is at goal.  Continue current dose of statin and diet Rx.  No myalgias or side effects.  F/U  LFT's in 6 months. Lab Results  Component Value Date   LDLCALC  Value: 55        Total Cholesterol/HDL:CHD Risk Coronary Heart Disease Risk Table                     Men   Women  1/2 Average Risk   3.4   3.3  Average Risk       5.0   4.4  2 X Average Risk   9.6   7.1  3 X Average Risk  23.4   11.0        Use the calculated Patient Ratio above and the CHD Risk Table to determine the patient's CHD Risk.        ATP III CLASSIFICATION (LDL):  <100     mg/dL   Optimal  100-129  mg/dL   Near or Above                    Optimal  130-159  mg/dL   Borderline  160-189  mg/dL   High  >190     mg/dL   Very High 07/04/2010

## 2010-09-21 NOTE — Progress Notes (Signed)
45 yo with anterior MI 3/22.  BMS mid lad by Dr Fletcher Anon.  D/C with lifevest.  Unfortunately F/U cardiac MRI 5/4 showed EF 33% with very large apical thrombus.  Low EF and thrombus confirmed on echo 5/7.  Both studies reviewed and discussed with patient.  Started on coumadin day of MRI.  NO TIA symptoms.  NO CHF symptoms and no recurrent angina.  CRF with Cr going up to 3.29 in hospital.  Baseline around 2.6.  Followed by Dr Clover Mealy.  Long discussion with patient and wife regarding anticoagulation for stroke prevention.  Also long discussion about need for AICD.   Will see Dr [name] ASAP.    Will need to discuss timing of coumadin cessation and need for lovenox bridge.  Has multiple large clots in ventricle.  But I am very concerned about pocket hematoma in patient on ASA/Plavix/coumadin and with qualitative platlet abnormality from CRF.    Patient very intelligent and understands all of the issues.  ROS: Denies fever, malais, weight loss, blurry vision, decreased visual acuity, cough, sputum, SOB, hemoptysis, pleuritic pain, palpitaitons, heartburn, abdominal pain, melena, lower extremity edema, claudication, or rash.   General: Affect appropriate Healthy:  appears stated age 67: normal Neck supple with no adenopathy JVP normal no bruits no thyromegaly Lungs clear with no wheezing and good diaphragmatic motion Heart:  S1/S2 no murmur,rub, gallop or click PMI normal Abdomen: benighn, BS positve, no tenderness, no AAA no bruit.  No HSM or HJR Distal pulses intact with no bruits No edema Neuro non-focal Skin warm and dry No muscular weakness   Current Outpatient Prescriptions  Medication Sig Dispense Refill  . allopurinol (ZYLOPRIM) 300 MG tablet Take 300 mg by mouth daily.        [name] aspirin 325 MG tablet Take 325 mg by mouth daily.        [name] atorvastatin (LIPITOR) 80 MG tablet Take 80 mg by mouth daily.        . calcitRIOL (ROCALTROL) 0.25 MCG capsule Take 0.25 mcg by mouth daily.         . carvedilol (COREG) 3.125 MG tablet TAKE 1 TABLET BY MOUTH TWICE A DAY WITH MEALS  60 tablet  0  . clopidogrel (PLAVIX) 75 MG tablet Take 75 mg by mouth daily.        [name] esomeprazole (NEXIUM) 40 MG capsule Take 40 mg by mouth daily before breakfast.        . fluticasone (FLOVENT HFA) 44 MCG/ACT inhaler Inhale 1 puff into the lungs 2 (two) times daily.        . hydrALAZINE (APRESOLINE) 10 MG tablet 1 tablet three times a day  90 tablet  6  . isosorbide mononitrate (IMDUR) 30 MG 24 hr tablet TAKE 1 TABLET BY MOUTH EVERY DAY  30 tablet  0  . niacin (NIASPAN) 500 MG CR tablet 2 tabs po qhs       . nitroGLYCERIN (NITROSTAT) 0.4 MG SL tablet Place 0.4 mg under the tongue every 5 (five) minutes as needed.        [name] oxyCODONE-acetaminophen (PERCOCET) 5-325 MG per tablet Take 1 tablet by mouth every 4 (four) hours as needed.        . warfarin (COUMADIN) 5 MG tablet Take 2 tablets on Friday and 2 tablets on Saturday. Sunday take 1 tablet  32 tablet  3  . DISCONTD: lisinopril (PRINIVIL,ZESTRIL) 20 MG tablet Take 1 tablet (20 mg total) by mouth daily.  30 tablet  11  Allergies  Review of patient's allergies indicates no known allergies.  Electrocardiogram:  Assessment and Plan

## 2010-09-21 NOTE — Assessment & Plan Note (Signed)
CHF compensated.  Post MI EF <35% with anterior scar.  F/U [name] AICD

## 2010-09-25 ENCOUNTER — Encounter: Payer: Self-pay | Admitting: Internal Medicine

## 2010-09-25 ENCOUNTER — Ambulatory Visit (INDEPENDENT_AMBULATORY_CARE_PROVIDER_SITE_OTHER): Payer: Managed Care, Other (non HMO) | Admitting: Internal Medicine

## 2010-09-25 VITALS — BP 134/87 | HR 107 | Ht 67.0 in | Wt 218.0 lb

## 2010-09-25 DIAGNOSIS — I2589 Other forms of chronic ischemic heart disease: Secondary | ICD-10-CM

## 2010-09-25 DIAGNOSIS — I498 Other specified cardiac arrhythmias: Secondary | ICD-10-CM

## 2010-09-25 DIAGNOSIS — I2510 Atherosclerotic heart disease of native coronary artery without angina pectoris: Secondary | ICD-10-CM

## 2010-09-25 DIAGNOSIS — I255 Ischemic cardiomyopathy: Secondary | ICD-10-CM

## 2010-09-25 DIAGNOSIS — N189 Chronic kidney disease, unspecified: Secondary | ICD-10-CM

## 2010-09-25 DIAGNOSIS — R000 Tachycardia, unspecified: Secondary | ICD-10-CM | POA: Insufficient documentation

## 2010-09-25 DIAGNOSIS — I513 Intracardiac thrombosis, not elsewhere classified: Secondary | ICD-10-CM

## 2010-09-25 DIAGNOSIS — I219 Acute myocardial infarction, unspecified: Secondary | ICD-10-CM

## 2010-09-25 MED ORDER — CARVEDILOL 6.25 MG PO TABS: 6.2500 mg | ORAL_TABLET | Freq: Two times a day (BID) | ORAL | Status: DC

## 2010-09-25 NOTE — Assessment & Plan Note (Signed)
Given his tachycardia and his cardiomyopathy we will increase his carvedilol today. His blood pressure seems to be adequate.

## 2010-09-25 NOTE — Progress Notes (Signed)
HPI: Roy Dyer is a 45 y.o. male Seen at the request of Dr. Mariana Single for consideration of ICD implantation.  Roy Dyer has a long history of lupus and chronic renal insufficiency. He has a known history of coronary artery disease having had stents placed   in his right coronary artery and left circumflex in 2001.  Last cath in 2005 without significant findings and a negative Myoview as recently as 2012 who presented one month later with substernal chest pain ST elevation and was found to have an LAD occlusion. He underwent bare-metal stenting. Because of depressed left ventricular function he also received a life vest.  Reassessment of left ventricular function the last week has demonstrated ejection fraction of 33% range by MR and by echo.  He has minimal limitations in his exercise tolerance. He has mild edema; no nocturnal dyspnea brief visit on 3 pillows for forever because of his hips  Most recently his creatinine had gone to the 4 range following up titration of his lisinopril. It has actually been discontinued. His heart rate has been elevated over the last months. Review of the hospital records as well as office records suggested in the mid 80s up to the 110 s. The patient however recalls having had heart rates in the 60s prior to exercise. Current Outpatient Prescriptions  Medication Sig Dispense Refill  . allopurinol (ZYLOPRIM) 300 MG tablet Take 300 mg by mouth daily.        [name] aspirin 325 MG tablet Take 325 mg by mouth daily.        [name] atorvastatin (LIPITOR) 80 MG tablet Take 80 mg by mouth daily.        . calcitRIOL (ROCALTROL) 0.25 MCG capsule Take 0.25 mcg by mouth daily.        . carvedilol (COREG) 3.125 MG tablet TAKE 1 TABLET BY MOUTH TWICE A DAY WITH MEALS  60 tablet  0  . clopidogrel (PLAVIX) 75 MG tablet Take 75 mg by mouth daily.        [name] esomeprazole (NEXIUM) 40 MG capsule Take 40 mg by mouth daily before breakfast.        . fluticasone (FLOVENT HFA) 44 MCG/ACT inhaler Inhale 1  puff into the lungs 2 (two) times daily.        . hydrALAZINE (APRESOLINE) 10 MG tablet 1 tablet three times a day  90 tablet  6  . isosorbide mononitrate (IMDUR) 30 MG 24 hr tablet TAKE 1 TABLET BY MOUTH EVERY DAY  30 tablet  0  . niacin (NIASPAN) 500 MG CR tablet 2 tabs po qhs       . nitroGLYCERIN (NITROSTAT) 0.4 MG SL tablet Place 0.4 mg under the tongue every 5 (five) minutes as needed.        [name] oxyCODONE-acetaminophen (PERCOCET) 5-325 MG per tablet Take 1 tablet by mouth every 4 (four) hours as needed.        . warfarin (COUMADIN) 5 MG tablet Take 2 tablets on Friday and 2 tablets on Saturday. Sunday take 1 tablet  32 tablet  3    No Known Allergies  Past Medical History  Diagnosis Date  . Lupus   . CAD (coronary artery disease)      stents RCA/Circ 2001  . CRF (chronic renal failure)       Cr 2.8 Dr. Clover Mealy  . Dysplasia     Left Hip     No past surgical history on file.  Family History  Problem Relation  Age of Onset  . Kidney failure Mother   . Heart attack Father     History   Social History  . Marital Status: Married    Spouse Name: N/A    Number of Children: N/A  . Years of Education: N/A   Occupational History  . Not on file.   Social History Main Topics  . Smoking status: Former Smoker    Types: Cigarettes    Quit date: 05/14/2010  . Smokeless tobacco: Not on file  . Alcohol Use: No  . Drug Use: No  . Sexually Active: Not on file   Other Topics Concern  . Not on file   Social History Narrative   Married Tobacco Use - No. Drug Use - noSmoking Status:  neverDrug Use:  no    Fourteen point review of systems was negative except as noted in HPI and PMH   PHYSICAL EXAMINATION  Blood pressure 134/87, pulse 107, height 5\' 7"  (1.702 m), weight 218 lb (98.884 kg).   Well developed and nourished obese Younger Caucasian male in no acute distress wearing a Life Vest HENT normal Neck supple with JVP-flat Carotids brisk and full without bruits Back  without scoliosis or kyphosis Clear Regular rate and rhythm, no murmurs or gallops Abd-soft with active BS without hepatomegaly or midline pulsation Femoral pulses 2+ distal pulses intact No Clubbing cyanosis edema Skin-warm and dry; stretch marks on cheset LN-neg submandibular and supraclavicular A & Oriented CN 3-12 normal  Grossly normal sensory and motor function Affect engaging . At the cardiogram demonstrates sinus rhythm at 107 and intervals 0.19/2008/23 2@axis  is 140 There are no R waves in V2-V6 and inferior Q waves as well as Q waves in leads one and L.

## 2010-09-25 NOTE — Patient Instructions (Signed)
Your physician has recommended you make the following change in your medication:  1) decrease aspirin to 81mg  once daily. 2) increase Carvedilol to 6.25mg  one tablet twice daily.  We will have Dr. Kyla Balzarine nurse, Fredia Beets, contact you about further testing in mid June to look at your heart pumping function.  We will also ask Dr. Johnsie Cancel if you may come off of plavix.

## 2010-09-25 NOTE — Assessment & Plan Note (Signed)
This is being closely followed by Dr. Moshe Cipro. He had ICD implantation is appropriate, we will plan to go from the right side

## 2010-09-25 NOTE — Assessment & Plan Note (Signed)
The patient is now 6 weeks status post MI with a treated bare-metal stent in his LAD. His ejection fraction most recently has been in the low 30s. He will be important to reassess left ventricular function at about 3 months post intervention. In the event that his left ventricular function remains depressed, ICD implantation would be appropriate to consider. He will be maintained with his life vest in the interim.

## 2010-09-25 NOTE — Assessment & Plan Note (Addendum)
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 He is appropriately on Coumadin. In the event that we need to undertake ICD implantation, it'll be done with him on therapeutic anticoagulation. We would probably defer defibrillation threshold testing given its presence

## 2010-09-26 ENCOUNTER — Ambulatory Visit (INDEPENDENT_AMBULATORY_CARE_PROVIDER_SITE_OTHER): Payer: Managed Care, Other (non HMO) | Admitting: *Deleted

## 2010-09-26 DIAGNOSIS — I749 Embolism and thrombosis of unspecified artery: Secondary | ICD-10-CM

## 2010-09-26 LAB — POCT INR: INR: 3.5

## 2010-10-01 ENCOUNTER — Other Ambulatory Visit: Payer: Self-pay | Admitting: Cardiovascular Disease

## 2010-10-02 ENCOUNTER — Ambulatory Visit (INDEPENDENT_AMBULATORY_CARE_PROVIDER_SITE_OTHER): Payer: Managed Care, Other (non HMO) | Admitting: *Deleted

## 2010-10-02 DIAGNOSIS — I513 Intracardiac thrombosis, not elsewhere classified: Secondary | ICD-10-CM

## 2010-10-02 DIAGNOSIS — I219 Acute myocardial infarction, unspecified: Secondary | ICD-10-CM

## 2010-10-02 LAB — POCT INR: INR: 2.8

## 2010-10-09 ENCOUNTER — Other Ambulatory Visit: Payer: Self-pay | Admitting: *Deleted

## 2010-10-09 MED ORDER — ISOSORBIDE MONONITRATE ER 30 MG PO TB24: 30.0000 mg | ORAL_TABLET | Freq: Every day | ORAL | Status: DC

## 2010-10-12 ENCOUNTER — Ambulatory Visit (INDEPENDENT_AMBULATORY_CARE_PROVIDER_SITE_OTHER): Payer: Managed Care, Other (non HMO) | Admitting: *Deleted

## 2010-10-12 DIAGNOSIS — I513 Intracardiac thrombosis, not elsewhere classified: Secondary | ICD-10-CM

## 2010-10-12 DIAGNOSIS — I219 Acute myocardial infarction, unspecified: Secondary | ICD-10-CM

## 2010-10-19 NOTE — Telephone Encounter (Signed)
Done

## 2010-10-23 ENCOUNTER — Ambulatory Visit (INDEPENDENT_AMBULATORY_CARE_PROVIDER_SITE_OTHER): Payer: Managed Care, Other (non HMO) | Admitting: Family Medicine

## 2010-10-23 ENCOUNTER — Telehealth: Payer: Self-pay | Admitting: Family Medicine

## 2010-10-23 ENCOUNTER — Encounter: Payer: Self-pay | Admitting: Family Medicine

## 2010-10-23 VITALS — BP 137/90 | HR 88 | Temp 97.5°F | Ht 67.0 in | Wt 219.4 lb

## 2010-10-23 DIAGNOSIS — N529 Male erectile dysfunction, unspecified: Secondary | ICD-10-CM

## 2010-10-23 DIAGNOSIS — R7309 Other abnormal glucose: Secondary | ICD-10-CM

## 2010-10-23 DIAGNOSIS — M329 Systemic lupus erythematosus, unspecified: Secondary | ICD-10-CM

## 2010-10-23 DIAGNOSIS — R739 Hyperglycemia, unspecified: Secondary | ICD-10-CM

## 2010-10-23 DIAGNOSIS — I2589 Other forms of chronic ischemic heart disease: Secondary | ICD-10-CM

## 2010-10-23 DIAGNOSIS — Z23 Encounter for immunization: Secondary | ICD-10-CM

## 2010-10-23 DIAGNOSIS — M109 Gout, unspecified: Secondary | ICD-10-CM

## 2010-10-23 DIAGNOSIS — I255 Ischemic cardiomyopathy: Secondary | ICD-10-CM

## 2010-10-23 DIAGNOSIS — N189 Chronic kidney disease, unspecified: Secondary | ICD-10-CM

## 2010-10-23 DIAGNOSIS — Z0000 Encounter for general adult medical examination without abnormal findings: Secondary | ICD-10-CM

## 2010-10-23 DIAGNOSIS — E782 Mixed hyperlipidemia: Secondary | ICD-10-CM

## 2010-10-23 DIAGNOSIS — Z125 Encounter for screening for malignant neoplasm of prostate: Secondary | ICD-10-CM

## 2010-10-23 LAB — BASIC METABOLIC PANEL
BUN: 28 mg/dL — ABNORMAL HIGH (ref 6–23)
CO2: 25 mEq/L (ref 19–32)
Chloride: 111 mEq/L (ref 96–112)
Glucose, Bld: 99 mg/dL (ref 70–99)
Potassium: 4.7 mEq/L (ref 3.5–5.1)

## 2010-10-23 LAB — CBC WITH DIFFERENTIAL/PLATELET
Basophils Relative: 0.5 % (ref 0.0–3.0)
Eosinophils Relative: 3.8 % (ref 0.0–5.0)
Hemoglobin: 15.1 g/dL (ref 13.0–17.0)
Lymphocytes Relative: 18.7 % (ref 12.0–46.0)
Neutrophils Relative %: 69.6 % (ref 43.0–77.0)
RBC: 4.54 Mil/uL (ref 4.22–5.81)
WBC: 7 10*3/uL (ref 4.5–10.5)

## 2010-10-23 LAB — PSA: PSA: 1.71 ng/mL (ref 0.10–4.00)

## 2010-10-23 NOTE — Assessment & Plan Note (Addendum)
Reviewed the current plans for the patient's chronic diagnoses.  Refilled medications as necessary.  Routine lab monitoring ordered--CBC, CMET, TSH, lipids, uric acid (labs last wk at Dr. Clover Mealy showed ESR of 1, normal Hb and IBC panel, normal ferritin, glucose 154 (not fasting), BUN/Cr 29/3.15), urine pro/cr ratio 6.28 (high), PTH 113 (high) Ca normal.   Discussed prudent diet and regular exercise, as well as basic weight management options as appropriate.  Discussed available screening tests and procedures as appropriate for patient's age and gender. TdaP given today.

## 2010-10-23 NOTE — Assessment & Plan Note (Signed)
Says this only started after recent MI, when he was switched from metoprolol to carvedilol. He'll check w/ [name] at his f/u to see if any change can be made for this reason. Will check testosterone level today.

## 2010-10-23 NOTE — Assessment & Plan Note (Signed)
Managed by Dr. Johnsie Cancel. LDL at goal 09/2010, plans for repeat 78mo.

## 2010-10-23 NOTE — Progress Notes (Addendum)
Office Note 10/23/2010  CC:  Chief Complaint  Patient presents with  . Establish Care    new patient    HPI:  Roy Dyer is a 45 y.o. White male who is here to establish and get CPE. Patient's most recent primary MD: none.  Relocated from [place] about 3 yrs ago. Old records in [place] were reviewed prior to or during today's visit (cardiology notes in [facility], also recent labs from Dr. Clover Mealy).  Feeling fine today, no acute complaints. Wants prostate cancer screening and Tdap.  Past Medical History  Diagnosis Date  . Lupus   . CAD (coronary artery disease)      stents RCA/Circ 2001  . CRF (chronic renal failure)       Cr 2.8 Dr. Clover Mealy  . 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 Heart attack 2001 and 2012    3 stents  . Gout     Past Surgical History  Procedure Date  . Stents     05-2010 and 2- in 2010  . Partial hip arthroplasty     left  . Renal biopsy   . Tympanostomy tube placement 45 yrs old    Family History  Problem Relation Age of Onset  . Kidney failure Mother   . Lupus Mother   . Stroke Mother   . Diabetes Mother     type 2  . Heart attack Father     X 7  . Hypertension Father   . Hyperlipidemia Father   . Heart attack Sister 61    X 1  . Hyperlipidemia Sister   . Hypertension Sister   . Heart disease Sister   . Heart attack Paternal Grandfather   . Heart disease Paternal Aunt   . Heart disease Paternal Uncle     History   Social History  . Marital Status: Married    Spouse Name: N/A    Number of Children: N/A  . Years of Education: N/A   Occupational History  . Not on file.   Social History Main Topics  . Smoking status: Former Smoker -- 0.5 packs/day for 30 years    Types: Cigarettes    Quit date: 08/02/2010  . Smokeless tobacco: Never Used  . Alcohol Use: No  . Drug Use: No  . Sexually Active: Yes -- Male partner(s)   Other Topics Concern  . Not on file   Social History Narrative   Married, 44 y/o daught, 71 y/o  son. 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    Outpatient Encounter Prescriptions as of 10/23/2010  Medication Sig Dispense Refill  . allopurinol (ZYLOPRIM) 300 MG tablet Take 300 mg by mouth daily.        [name] aspirin 81 MG tablet Take 1 tablet (81 mg total) by mouth daily.      [name] atorvastatin (LIPITOR) 80 MG tablet Take 80 mg by mouth daily.        . calcitRIOL (ROCALTROL) 0.25 MCG capsule TAKE ONE CAPSULE BY MOUTH EVERY DAY  30 capsule  2  . carvedilol (COREG) 6.25 MG tablet Take 1 tablet (6.25 mg total) by mouth 2 (two) times daily.  60 tablet  11  . clopidogrel (PLAVIX) 75 MG tablet Take 75 mg by mouth daily.        [name] esomeprazole (NEXIUM) 40 MG capsule Take 40 mg by mouth daily before breakfast.        . fluticasone (FLOVENT HFA) 44  MCG/ACT inhaler Inhale 1 puff into the lungs 2 (two) times daily.        . hydrALAZINE (APRESOLINE) 25 MG tablet Take 25 mg by mouth 2 (two) times daily.        . isosorbide mononitrate (IMDUR) 30 MG 24 hr tablet Take 1 tablet (30 mg total) by mouth daily.  30 tablet  0  . niacin (NIASPAN) 500 MG CR tablet 2 tabs po qhs       . nitroGLYCERIN (NITROSTAT) 0.4 MG SL tablet Place 0.4 mg under the tongue every 5 (five) minutes as needed.        [name] oxyCODONE-acetaminophen (PERCOCET) 5-325 MG per tablet Take 1 tablet by mouth every 4 (four) hours as needed.        . warfarin (COUMADIN) 5 MG tablet Take 2 tablets on Friday and 2 tablets on Saturday. Sunday take 1 tablet  32 tablet  3  . DISCONTD: hydrALAZINE (APRESOLINE) 10 MG tablet 1 tablet three times a day  90 tablet  6    No Known Allergies  ROS Review of Systems  Constitutional: Negative for fever, chills, appetite change and fatigue.  HENT: Negative for ear pain, congestion, sore throat, neck stiffness and dental problem.   Eyes: Negative for discharge, redness and visual disturbance.  Respiratory: Negative for cough, chest tightness, shortness of breath and  wheezing.   Cardiovascular: Negative for chest pain, palpitations and leg swelling.  Gastrointestinal: Negative for nausea, vomiting, abdominal pain, diarrhea and blood in stool.  Genitourinary: Negative for dysuria, urgency, frequency, hematuria, flank pain and difficulty urinating.  Musculoskeletal: Negative for myalgias, back pain, joint swelling and arthralgias.  Skin: Negative for pallor and rash.  Neurological: Negative for dizziness, speech difficulty, weakness and headaches.  Hematological: Negative for adenopathy. Does not bruise/bleed easily.  Psychiatric/Behavioral: Negative for confusion and sleep disturbance. The patient is not nervous/anxious.      PE; Blood pressure 137/90, pulse 88, temperature 97.5 F (36.4 C), temperature source Oral, height 5\' 7"  (1.702 m), weight 219 lb 6.4 oz (99.519 kg), SpO2 98.00%. Gen: Alert, well appearing.  Patient is oriented to person, place, time, and situation. HEENT: Scalp without lesions or hair loss.  Ears: EACs clear, normal epithelium.  TMs with good light reflex and landmarks bilaterally.  Eyes: no injection, icteris, swelling, or exudate.  EOMI, PERRLA. Nose: no drainage or turbinate edema/swelling.  No injection or focal lesion.  Mouth: lips without lesion/swelling.  Oral mucosa pink and moist.  Dentition intact and without obvious caries or gingival swelling.  Oropharynx without erythema, exudate, or swelling.  Neck: supple, ROM full.  Carotids 2+ bilat, without bruit.  No lymphadenopathy, thyromegaly, or mass. Chest: symmetric expansion, nonlabored respirations.  Clear and equal breath sounds in all lung fields.   CV: RRR, no m/r/g.  Peripheral pulses 2+ and symmetric. ABD: soft, NT, ND, BS normal.  No hepatospenomegaly or mass.  No bruits. EXT: no clubbing, cyanosis, or edema.   Pertinent labs:  none  ASSESSMENT AND PLAN:   Health maintenance examination Reviewed the current plans for the patient's chronic diagnoses.  Refilled  medications as necessary.  Routine lab monitoring ordered--CBC, CMET, TSH, lipids, uric acid (labs last wk at Dr. Clover Mealy showed ESR of 1, normal Hb and IBC panel, normal ferritin, glucose 154 (not fasting), BUN/Cr 29/3.15), urine pro/cr ratio 6.28 (high), PTH 113 (high) Ca normal.   Discussed prudent diet and regular exercise, as well as basic weight management options as appropriate.  Discussed available screening  tests and procedures as appropriate for patient's age and gender. TdaP given today.  LUPUS Not active currently.  ischemic cardiomyopathy status post anterolateral MI With LV thrombus.  Doing well on ASA/Plavix/Coumadin. Wearing lifevest external defib. Has repeat cardiac MR set up to reassess LV fxn, and if EF still <40% he'll get ICD at f/u with [name].  MIXED HYPERLIPIDEMIA Managed by Dr. Johnsie Cancel. LDL at goal 09/2010, plans for repeat 76mo.  Erectile dysfunction Says this only started after recent MI, when he was switched from metoprolol to carvedilol. He'll check w/ [name] at his f/u to see if any change can be made for this reason. Will check testosterone level today.     Return in about 4 months (around 02/22/2011).

## 2010-10-23 NOTE — Assessment & Plan Note (Addendum)
With LV thrombus.  Doing well on ASA/Plavix/Coumadin. Wearing lifevest external defib. Has repeat cardiac MR set up to reassess LV fxn, and if EF still <40% he'll get ICD at f/u with [name].

## 2010-10-23 NOTE — Assessment & Plan Note (Signed)
Not active currently.

## 2010-10-23 NOTE — Telephone Encounter (Signed)
Pls request records from Dr. Clover Mealy at [facility]. In [place].  Thx--PM

## 2010-10-24 NOTE — Telephone Encounter (Signed)
Faxed 10/24/10

## 2010-10-26 ENCOUNTER — Ambulatory Visit (INDEPENDENT_AMBULATORY_CARE_PROVIDER_SITE_OTHER): Payer: Managed Care, Other (non HMO) | Admitting: *Deleted

## 2010-10-26 DIAGNOSIS — I513 Intracardiac thrombosis, not elsewhere classified: Secondary | ICD-10-CM

## 2010-10-26 DIAGNOSIS — I219 Acute myocardial infarction, unspecified: Secondary | ICD-10-CM

## 2010-10-29 ENCOUNTER — Emergency Department (HOSPITAL_BASED_OUTPATIENT_CLINIC_OR_DEPARTMENT_OTHER)
Admission: EM | Admit: 2010-10-29 | Discharge: 2010-10-29 | Disposition: A | Discharge status: Home / Self Care | Payer: Managed Care, Other (non HMO) | Attending: Emergency Medicine | Admitting: Emergency Medicine

## 2010-10-29 ENCOUNTER — Emergency Department (INDEPENDENT_AMBULATORY_CARE_PROVIDER_SITE_OTHER): Payer: Managed Care, Other (non HMO)

## 2010-10-29 DIAGNOSIS — R0602 Shortness of breath: Secondary | ICD-10-CM | POA: Insufficient documentation

## 2010-10-29 DIAGNOSIS — R05 Cough: Secondary | ICD-10-CM

## 2010-10-29 DIAGNOSIS — K219 Gastro-esophageal reflux disease without esophagitis: Secondary | ICD-10-CM | POA: Insufficient documentation

## 2010-10-29 DIAGNOSIS — R059 Cough, unspecified: Secondary | ICD-10-CM

## 2010-10-29 DIAGNOSIS — I2510 Atherosclerotic heart disease of native coronary artery without angina pectoris: Secondary | ICD-10-CM | POA: Insufficient documentation

## 2010-10-29 DIAGNOSIS — Z79899 Other long term (current) drug therapy: Secondary | ICD-10-CM | POA: Insufficient documentation

## 2010-10-29 DIAGNOSIS — I252 Old myocardial infarction: Secondary | ICD-10-CM | POA: Insufficient documentation

## 2010-10-29 DIAGNOSIS — E7800 Pure hypercholesterolemia, unspecified: Secondary | ICD-10-CM | POA: Insufficient documentation

## 2010-10-29 DIAGNOSIS — I509 Heart failure, unspecified: Secondary | ICD-10-CM | POA: Insufficient documentation

## 2010-10-29 DIAGNOSIS — I10 Essential (primary) hypertension: Secondary | ICD-10-CM | POA: Insufficient documentation

## 2010-10-29 LAB — DIFFERENTIAL
Eosinophils Absolute: 0.6 10*3/uL (ref 0.0–0.7)
Lymphocytes Relative: 15 % (ref 12–46)
Lymphs Abs: 1.5 10*3/uL (ref 0.7–4.0)
Monocytes Relative: 10 % (ref 3–12)
Neutro Abs: 6.9 10*3/uL (ref 1.7–7.7)
Neutrophils Relative %: 70 % (ref 43–77)

## 2010-10-29 LAB — BASIC METABOLIC PANEL
CO2: 25 mEq/L (ref 19–32)
Chloride: 108 mEq/L (ref 96–112)
Creatinine, Ser: 3 mg/dL — ABNORMAL HIGH (ref 0.50–1.35)

## 2010-10-29 LAB — CBC
HCT: 43.2 % (ref 39.0–52.0)
Hemoglobin: 14.9 g/dL (ref 13.0–17.0)
MCH: 32.1 pg (ref 26.0–34.0)
MCV: 93.1 fL (ref 78.0–100.0)
RBC: 4.64 MIL/uL (ref 4.22–5.81)

## 2010-10-29 LAB — PROTIME-INR: Prothrombin Time: 24.4 seconds — ABNORMAL HIGH (ref 11.6–15.2)

## 2010-10-29 LAB — PRO B NATRIURETIC PEPTIDE: Pro B Natriuretic peptide (BNP): 5057 pg/mL — ABNORMAL HIGH (ref 0–125)

## 2010-10-29 LAB — TROPONIN I: Troponin I: <0.3 ng/mL (ref <0.30)

## 2010-10-30 ENCOUNTER — Telehealth: Payer: Self-pay | Admitting: Cardiovascular Disease

## 2010-10-30 ENCOUNTER — Other Ambulatory Visit: Payer: Self-pay | Admitting: *Deleted

## 2010-10-30 ENCOUNTER — Other Ambulatory Visit: Payer: Self-pay | Admitting: Cardiovascular Disease

## 2010-10-30 MED ORDER — CLOPIDOGREL BISULFATE 75 MG PO TABS: 75.0000 mg | ORAL_TABLET | Freq: Every day | ORAL | Status: DC

## 2010-10-30 NOTE — Telephone Encounter (Signed)
Spoke with pt, he was given 40mg  IV lasix in the er. He had a 40mg  po script and was told by the er doctor to take that today and then call us to find out what dose to take daily. The pt cont to have swelling in his feet and legs at the end of the day. No real SOB and his weight is down 3-4 lbs since yesterday. He will cont to take 40mg  once daily and will forward to dr Johnsie Cancel for his review Fredia Beets

## 2010-10-30 NOTE — Telephone Encounter (Signed)
Pt seen in er over the weekend and was given lasix to help with the fluid build up, was told to call to see what dosage he should be on and he has appt 7-6 should he be seen sooner?

## 2010-10-30 NOTE — Telephone Encounter (Signed)
Returning call back. 

## 2010-11-01 ENCOUNTER — Other Ambulatory Visit: Payer: Self-pay | Admitting: Family Medicine

## 2010-11-01 DIAGNOSIS — N189 Chronic kidney disease, unspecified: Secondary | ICD-10-CM

## 2010-11-02 NOTE — Telephone Encounter (Signed)
He has renal failure with a Cr of 3 All questions about diuretic dose should be handled by nephrology and his renal doctors !!!!!

## 2010-11-02 NOTE — Telephone Encounter (Signed)
Spoke with pt wife, she is aware pt should contact renal doctors regarding furosemide. He also wanted to know what testing he needed to reeval his EF%. Per dr Johnsie Cancel pt would need cardiac MRI. Order placed [facility]

## 2010-11-07 ENCOUNTER — Encounter: Payer: Self-pay | Admitting: Cardiovascular Disease

## 2010-11-09 ENCOUNTER — Encounter: Payer: Self-pay | Admitting: Family Medicine

## 2010-11-09 ENCOUNTER — Other Ambulatory Visit: Payer: Self-pay | Admitting: Family Medicine

## 2010-11-09 ENCOUNTER — Encounter: Payer: Self-pay | Admitting: *Deleted

## 2010-11-14 ENCOUNTER — Ambulatory Visit (HOSPITAL_COMMUNITY)
Admission: RE | Admit: 2010-11-14 | Discharge: 2010-11-14 | Disposition: A | Discharge status: Home / Self Care | Payer: Managed Care, Other (non HMO) | Source: Ambulatory Visit | Attending: Cardiovascular Disease | Admitting: Cardiovascular Disease

## 2010-11-14 DIAGNOSIS — I219 Acute myocardial infarction, unspecified: Secondary | ICD-10-CM

## 2010-11-14 DIAGNOSIS — I428 Other cardiomyopathies: Secondary | ICD-10-CM | POA: Insufficient documentation

## 2010-11-14 DIAGNOSIS — R9439 Abnormal result of other cardiovascular function study: Secondary | ICD-10-CM | POA: Insufficient documentation

## 2010-11-17 ENCOUNTER — Ambulatory Visit (INDEPENDENT_AMBULATORY_CARE_PROVIDER_SITE_OTHER): Payer: Managed Care, Other (non HMO) | Admitting: Cardiovascular Disease

## 2010-11-17 ENCOUNTER — Ambulatory Visit (INDEPENDENT_AMBULATORY_CARE_PROVIDER_SITE_OTHER): Payer: Managed Care, Other (non HMO) | Admitting: *Deleted

## 2010-11-17 ENCOUNTER — Encounter: Payer: Self-pay | Admitting: Cardiovascular Disease

## 2010-11-17 ENCOUNTER — Encounter: Payer: Self-pay | Admitting: *Deleted

## 2010-11-17 DIAGNOSIS — E782 Mixed hyperlipidemia: Secondary | ICD-10-CM

## 2010-11-17 DIAGNOSIS — I513 Intracardiac thrombosis, not elsewhere classified: Secondary | ICD-10-CM

## 2010-11-17 DIAGNOSIS — I255 Ischemic cardiomyopathy: Secondary | ICD-10-CM

## 2010-11-17 DIAGNOSIS — I219 Acute myocardial infarction, unspecified: Secondary | ICD-10-CM

## 2010-11-17 DIAGNOSIS — I10 Essential (primary) hypertension: Secondary | ICD-10-CM

## 2010-11-17 DIAGNOSIS — I2589 Other forms of chronic ischemic heart disease: Secondary | ICD-10-CM

## 2010-11-17 DIAGNOSIS — I5022 Chronic systolic (congestive) heart failure: Secondary | ICD-10-CM

## 2010-11-17 LAB — POCT INR: INR: 2

## 2010-11-17 MED ORDER — METOPROLOL TARTRATE 100 MG PO TABS: 100.0000 mg | ORAL_TABLET | Freq: Two times a day (BID) | ORAL | Status: DC

## 2010-11-17 NOTE — Assessment & Plan Note (Signed)
EF still low at 28% 90 days post stent to LAD.  Having some congestive symptoms especially with salt intake.  Will benefit from BiV and has f/u Cr scheduled.  Not on ACE but taking hydralazine and nitrates

## 2010-11-17 NOTE — Assessment & Plan Note (Signed)
Cholesterol is at goal.  Continue current dose of statin and diet Rx.  No myalgias or side effects.  F/U  LFT's in 6 months. Lab Results  Component Value Date   LDLCALC  Value: 55        Total Cholesterol/HDL:CHD Risk Coronary Heart Disease Risk Table                     Men   Women  1/2 Average Risk   3.4   3.3  Average Risk       5.0   4.4  2 X Average Risk   9.6   7.1  3 X Average Risk  23.4   11.0        Use the calculated Patient Ratio above and the CHD Risk Table to determine the patient's CHD Risk.        ATP III CLASSIFICATION (LDL):  <100     mg/dL   Optimal  100-129  mg/dL   Near or Above                    Optimal  130-159  mg/dL   Borderline  160-189  mg/dL   High  >190     mg/dL   Very High 07/04/2010

## 2010-11-17 NOTE — Assessment & Plan Note (Signed)
Discussed with Dr [name]  Will likely put AICD in on coumadin.  Will continue anticoagulation for 1 year with large apical clot.  Will look at MRI again.  Not done with contrast but looked better as I didn't see any clot when I calculated his EF

## 2010-11-17 NOTE — Assessment & Plan Note (Signed)
Well controlled.  Continue current medications and low sodium Dash type diet.    

## 2010-11-17 NOTE — Progress Notes (Signed)
Seen at the request of Dr. Mariana Single for consideration of ICD implantation.  Roy Dyer has a long history of lupus and chronic renal insufficiency. He has a known history of coronary artery disease having had stents placed  in his right coronary artery and left circumflex in 2001. Last cath in 2005 without significant findings and a negative Myoview as recently as 2012 who presented one month later with substernal chest pain ST elevation and was found to have an LAD occlusion. He underwent bare-metal stenting. Date of  acute anterior MI 08/02/10. EF 30% in hospital  Because of depressed left ventricular function he also received a life vest.  Reassessment of left ventricular function 09/21/10 demonstrated ejection fraction of 33% range by MR and by echo.  d heart rates in the 60s prior to exercise.  He thinks that lopresser lowers his HR better than coreg and causes less ED.  Prior to MI was on lopresser 100 bid.  Will change back.  Not on ACE as it elevated his Cr even more  Reviewed his MRI from 11/13/10  EF calculated at 28%.  Will need AICD.  Did not appreciate LV thrombus and will review again but will likely need heparin bridge or to do AICD on coumadin  Personally called Dr [name] to make sure we expidite the implantation of a BiV AICD as the patient is tired of wearing a life vest in 100 degree heat.   ROS: Denies fever, malais, weight loss, blurry vision, decreased visual acuity, cough, sputum, SOB, hemoptysis, pleuritic pain, palpitaitons, heartburn, abdominal pain, melena, lower extremity edema, claudication, or rash.  All other systems reviewed and negative  General: Affect appropriate Healthy:  appears stated age 45: normal Neck supple with no adenopathy JVP normal no bruits no thyromegaly Lungs clear with no wheezing and good diaphragmatic motion Heart:  S1/S2 no murmur,rub, gallop or click PMI normal Abdomen: benighn, BS positve, no tenderness, no AAA no bruit.  No HSM or HJR Distal  pulses intact with no bruits No edema Neuro non-focal Skin warm and dry No muscular weakness Life vest is on   Current Outpatient Prescriptions  Medication Sig Dispense Refill  . allopurinol (ZYLOPRIM) 300 MG tablet Take 300 mg by mouth daily.        [name] aspirin 81 MG tablet Take 1 tablet (81 mg total) by mouth daily.      [name] atorvastatin (LIPITOR) 80 MG tablet TAKE 1 TABLET BY MOUTH EVERY DAY AT BEDTIME  30 tablet  10  . calcitRIOL (ROCALTROL) 0.25 MCG capsule TAKE ONE CAPSULE BY MOUTH EVERY DAY  30 capsule  2  . clopidogrel (PLAVIX) 75 MG tablet Take 1 tablet (75 mg total) by mouth daily.  30 tablet  6  . esomeprazole (NEXIUM) 40 MG capsule Take 40 mg by mouth daily before breakfast.        . FLOVENT HFA 44 MCG/ACT inhaler INHALE 1 PUFF BY MOUTH TWICE A DAY  3 Inhaler  0  . furosemide (LASIX) 40 MG tablet Take 40 mg by mouth daily.        . hydrALAZINE (APRESOLINE) 25 MG tablet Take 25 mg by mouth 2 (two) times daily.        . isosorbide mononitrate (IMDUR) 30 MG 24 hr tablet Take 1 tablet (30 mg total) by mouth daily.  30 tablet  0  . metoprolol (LOPRESSOR) 100 MG tablet Take 1 tablet (100 mg total) by mouth 2 (two) times daily.  60 tablet  11  .  niacin (NIASPAN) 500 MG CR tablet 2 tabs po qhs       . nitroGLYCERIN (NITROSTAT) 0.4 MG SL tablet Place 0.4 mg under the tongue every 5 (five) minutes as needed.        [name] oxyCODONE-acetaminophen (PERCOCET) 5-325 MG per tablet Take 1 tablet by mouth every 4 (four) hours as needed.        . warfarin (COUMADIN) 5 MG tablet Take 2 tablets on Friday and 2 tablets on Saturday. Sunday take 1 tablet  32 tablet  3  . DISCONTD: carvedilol (COREG) 6.25 MG tablet Take 1 tablet (6.25 mg total) by mouth 2 (two) times daily.  60 tablet  11  . DISCONTD: metoprolol (LOPRESSOR) 100 MG tablet Take 50 mg by mouth daily.          Allergies  Review of patient's allergies indicates no known allergies.  Electrocardiogram:  Assessment and Plan

## 2010-11-17 NOTE — Patient Instructions (Signed)
You will be contacted with a date for placement of your BiV ICD. You will be scheduled for a follow up with Dr Johnsie Cancel 2 weeks after the placement.  Medication changes:  Lopressor 100 mg twice a day,  Stop coreg

## 2010-11-20 ENCOUNTER — Ambulatory Visit (HOSPITAL_COMMUNITY)
Admission: RE | Admit: 2010-11-20 | Discharge: 2010-11-21 | Disposition: A | Discharge status: Home / Self Care | Payer: Managed Care, Other (non HMO) | Source: Ambulatory Visit | Attending: Internal Medicine | Admitting: Internal Medicine

## 2010-11-20 ENCOUNTER — Encounter: Payer: Self-pay | Admitting: *Deleted

## 2010-11-20 DIAGNOSIS — I2589 Other forms of chronic ischemic heart disease: Secondary | ICD-10-CM | POA: Insufficient documentation

## 2010-11-20 DIAGNOSIS — I5189 Other ill-defined heart diseases: Secondary | ICD-10-CM | POA: Insufficient documentation

## 2010-11-20 DIAGNOSIS — E785 Hyperlipidemia, unspecified: Secondary | ICD-10-CM | POA: Insufficient documentation

## 2010-11-20 DIAGNOSIS — K219 Gastro-esophageal reflux disease without esophagitis: Secondary | ICD-10-CM | POA: Insufficient documentation

## 2010-11-20 DIAGNOSIS — I129 Hypertensive chronic kidney disease with stage 1 through stage 4 chronic kidney disease, or unspecified chronic kidney disease: Secondary | ICD-10-CM | POA: Insufficient documentation

## 2010-11-20 DIAGNOSIS — IMO0002 Reserved for concepts with insufficient information to code with codable children: Secondary | ICD-10-CM | POA: Insufficient documentation

## 2010-11-20 DIAGNOSIS — N189 Chronic kidney disease, unspecified: Secondary | ICD-10-CM | POA: Insufficient documentation

## 2010-11-20 DIAGNOSIS — I509 Heart failure, unspecified: Secondary | ICD-10-CM | POA: Insufficient documentation

## 2010-11-20 DIAGNOSIS — M329 Systemic lupus erythematosus, unspecified: Secondary | ICD-10-CM | POA: Insufficient documentation

## 2010-11-20 DIAGNOSIS — I2510 Atherosclerotic heart disease of native coronary artery without angina pectoris: Secondary | ICD-10-CM | POA: Insufficient documentation

## 2010-11-20 LAB — PROTIME-INR: INR: 1.32 (ref 0.00–1.49)

## 2010-11-20 LAB — CBC
HCT: 43.9 % (ref 39.0–52.0)
Platelets: 156 10*3/uL (ref 150–400)
RDW: 13.9 % (ref 11.5–15.5)
WBC: 8.1 10*3/uL (ref 4.0–10.5)

## 2010-11-20 LAB — POCT I-STAT, CHEM 8
Calcium, Ion: 1.19 mmol/L (ref 1.12–1.32)
HCT: 48 % (ref 39.0–52.0)
Hemoglobin: 16.3 g/dL (ref 13.0–17.0)
TCO2: 22 mmol/L (ref 0–100)

## 2010-11-20 LAB — SURGICAL PCR SCREEN: MRSA, PCR: NEGATIVE

## 2010-11-20 LAB — APTT: aPTT: 31 seconds (ref 24–37)

## 2010-11-21 ENCOUNTER — Ambulatory Visit (HOSPITAL_COMMUNITY): Payer: Managed Care, Other (non HMO)

## 2010-11-21 LAB — BASIC METABOLIC PANEL
CO2: 29 mEq/L (ref 19–32)
Calcium: 8.8 mg/dL (ref 8.4–10.5)
Creatinine, Ser: 3.24 mg/dL — ABNORMAL HIGH (ref 0.50–1.35)
Glucose, Bld: 109 mg/dL — ABNORMAL HIGH (ref 70–99)

## 2010-11-22 ENCOUNTER — Encounter: Payer: Self-pay | Admitting: Family Medicine

## 2010-11-28 ENCOUNTER — Ambulatory Visit: Payer: Managed Care, Other (non HMO) | Admitting: Cardiovascular Disease

## 2010-11-29 ENCOUNTER — Other Ambulatory Visit (INDEPENDENT_AMBULATORY_CARE_PROVIDER_SITE_OTHER): Payer: Managed Care, Other (non HMO) | Admitting: *Deleted

## 2010-11-29 ENCOUNTER — Ambulatory Visit (INDEPENDENT_AMBULATORY_CARE_PROVIDER_SITE_OTHER): Payer: Managed Care, Other (non HMO) | Admitting: *Deleted

## 2010-11-29 DIAGNOSIS — I513 Intracardiac thrombosis, not elsewhere classified: Secondary | ICD-10-CM

## 2010-11-29 DIAGNOSIS — I219 Acute myocardial infarction, unspecified: Secondary | ICD-10-CM

## 2010-11-29 DIAGNOSIS — Z7901 Long term (current) use of anticoagulants: Secondary | ICD-10-CM | POA: Insufficient documentation

## 2010-11-29 DIAGNOSIS — R000 Tachycardia, unspecified: Secondary | ICD-10-CM

## 2010-11-29 DIAGNOSIS — I2589 Other forms of chronic ischemic heart disease: Secondary | ICD-10-CM

## 2010-11-29 DIAGNOSIS — I428 Other cardiomyopathies: Secondary | ICD-10-CM

## 2010-11-29 DIAGNOSIS — Z79899 Other long term (current) drug therapy: Secondary | ICD-10-CM

## 2010-11-29 LAB — BASIC METABOLIC PANEL
CO2: 27 mEq/L (ref 19–32)
Calcium: 8.8 mg/dL (ref 8.4–10.5)
Creatinine, Ser: 4.3 mg/dL — ABNORMAL HIGH (ref 0.4–1.5)
Glucose, Bld: 113 mg/dL — ABNORMAL HIGH (ref 70–99)

## 2010-11-29 NOTE — Progress Notes (Signed)
ICD checked in device clinic./ ROV with [name] 03/07/11.

## 2010-11-30 NOTE — Op Note (Signed)
  NAMEROYEL, BORSTAD NO.:  [id number]  MEDICAL RECORD NO.:  [id number]  LOCATION:  2027                         FACILITY:  [place]  PHYSICIAN:  Deboraha Sprang, MD, FACCDATE OF BIRTH:  [birth year]  DATE OF PROCEDURE:  11/20/2010 DATE OF DISCHARGE:                              OPERATIVE REPORT   SURGEON:  Deboraha Sprang, MD, [facility]  PREOPERATIVE DIAGNOSES:  Ischemic cardiomyopathy and congestive heart failure class IIB3A.  Following obtaining informed consent, the patient was brought to the Electrophysiology Laboratory and placed on the fluoroscopic table in the supine position.  After routine prep and drape of the right upper chest, the patient being in a likely candidate for dialysis, lidocaine was infiltrated in the subclavicular prepectoral region in line that transected a steroid-associated stria.  This was carried down to layer of prepectoral fascia using sharp dissection and electrocautery.  The pocket was formed similarly.  Hemostasis was obtained.  Thereafter, attention was turned to gain access to the extrathoracic left subclavian vein, which was accomplished without difficulty and without the aspiration of air or puncture of the artery.  A single venipuncture was accomplished.  The guidewires were placed and retained. A 9-French sheath was placed, which was then passed a Medtronic Sprint Quattro [id number] dual-coil defibrillator lead, serial number [id number]. Under fluoroscopic guidance, we manipulated the right ventricular apex where the bipolar R-wave was 21.2 with a pace impedance of 685, threshold of 0.7 at 0.5.  Current of threshold was 0.8 mA.  There was no diaphragmatic pacing at 10 volts and the current of injury was moderate. The lead was secured to the prepectoral fascia and attached to Medtronic [id number] defibrillator, serial number [id number].  Through the device, the bipolar R-wave was 14.1 with a pace impedance of 551, a threshold of 1 volt  at 0.4, proximal coil impedance was 58 ohms, and distal coil impedance was 70 ohms.  Because of an apical thrombus, a DFT testing was deferred, but a 1 joule shock was delivered through measured resistance of 75 ohms in a synchronized fashion testing the high-voltage circuitry.  The device was implanted.  The pocket was copiously irrigated with antibiotic containing saline solution.  Hemostasis was assured.  Lead and the pulse generator were placed and the pocket secured to the prepectoral fascia, and the wound was closed in three layers in normal fashion.  The wound was washed, dried and a benzoin Steri-Strip dressing was applied. Needle counts, sponge counts, and instrument counts were correct at the end of the procedure.  The patient tolerated the procedure well without apparent complication.    Deboraha Sprang, MD, [facility]    SCK/MEDQ  D:  11/20/2010  T:  11/20/2010  Job:  [id number]  Electronically Signed by Virl Axe MD [facility] on 11/30/2010 02:13:12 PM

## 2010-11-30 NOTE — Discharge Summary (Signed)
Roy Dyer, Roy Dyer NO.:  [id number]  MEDICAL RECORD NO.:  [id number]  LOCATION:  2027                         FACILITY:  [place]  PHYSICIAN:  Roy Sprang, MD, FACCDATE OF BIRTH:  01-18-1966  DATE OF ADMISSION:  11/20/2010 DATE OF DISCHARGE:  11/21/2010                              DISCHARGE SUMMARY   PRIMARY CARE PHYSICIAN:  Roy Sou, MD  PRIMARY CARDIOLOGIST:  Roy Dyer. Roy Cancel, MD, [facility]  ELECTROPHYSIOLOGIST:  Roy Sprang, MD, [facility]  NEPHROLOGIST:  Roy Meckel, MD  PRIMARY DIAGNOSIS:  Ischemic cardiomyopathy with persistently depressed left ventricular function despite optimal medical therapy.  SECONDARY DIAGNOSES: 1. Coronary artery disease - status post anterolateral ST elevation     myocardial infarction in March 2012 with stent to the LAD at that     time.  The patient also has had stents previously placed to the     right coronary artery and left circ in 2001. 2. Hypertension. 3. Hyperlipidemia. 4. Systemic lupus with chronic steroids since 1990. 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 6. Gastroesophageal reflux disease. 7. Chronic renal insufficiency. 8. Left ventricular thrombus on anticoagulation.  ALLERGIES:  The patient has no known drug allergies.  PROCEDURES THIS ADMISSION: 1. Implantation of a single chamber implantable cardiac defibrillator     on November 20, 2010 by Dr. Caryl Dyer.  The patient received a Medtronic     model number [id number] defibrillator with model number [id number] right     ventricular lead.  DFTs were deferred at the time of implant     secondary to LV thrombus.  The patient had no early apparent     complications. 2. Chest x-ray on November 21, 2010 demonstrated no pneumothorax, status     post device implantation.  LABORATORY DATA THIS ADMISSION:  INR of 1.43.  BRIEF HISTORY OF PRESENT ILLNESS:  Mr. Linehan is a 45 year old male with a history of coronary artery disease who suffered anterior MI in  March of 2012.  He underwent bare metal stenting to his LAD at that time.  At discharge, he was placed on a LifeVest because of depressed LV function.  The patient was maintained on optimal medical therapy.  His ejection fraction was reassessed 90 days post revascularization and was found to still be less than 35%.  Because of these things, he was evaluated by Dr. Caryl Dyer in the outpatient setting for evaluation of prophylactic ICD implantations.  Risks, benefits, and alternatives of this procedure were reviewed with the patient and he wished to proceed.  HOSPITAL COURSE:  The patient was admitted on November 20, 2010 for planned implantation of a single cardiac defibrillator.  This was carried out by Dr. Caryl Dyer with details as outlined above.  He was monitored on telemetry which demonstrated sinus rhythm.  Chest x-ray was obtained which demonstrated no pneumothorax status post device implant.  The patient's device was interrogated and found to be functioning normally.  Dr. Caryl Dyer did speak with renal and heart failure physicians on July 9.  There were no new recommendations at that time.  The patient's creatinine have been noted to go from 3 to 3.7.  Because of these things,  the patient's Lasix on discharge will be changed to 40 mg every other day.  We will also recheck a BMET at his wound check appointment.  FOLLOWUP APPOINTMENTS: 1. [facility] on November 29, 2010 at 9:30 a.m. [facility] on November 29, 2010 at 9:15 a.m. 3. BMET at [facility] office on July 18. 4. Dr. Johnsie Dyer on December 12, 2010 at 10:15 a.m. 5. Dr. Caryl Dyer on March 07, 2011 at 9:15 a.m. 6. Dr. Moshe Dyer as scheduled. 7. Dr. Anitra Dyer as scheduled.  DISCHARGE INSTRUCTIONS: 1. Increase activity slowly. 2. No driving for 1 week. 3. Follow a low-sodium, heart-healthy diet. 4. See supplemental device discharge instructions for wound care and     arm ability. 5. Keep incisions  clean and dry for 1 week.  DISCHARGE MEDICATIONS: 1. Allopurinol 300 mg 1 tablet daily. 2. Aspirin 81 mg daily. 3. Warfarin 5 mg to take half tablet every day except Monday.  On     Wednesday, he should take 1 tablet. 4. Calcitriol 0.25 mcg daily. 5. Plavix 75 mg daily. 6. Flovent inhaler 1 puff twice daily. 7. Hydralazine 25 mg 1 tablet twice daily. 8. Imdur 30 mg daily. 9. Lasix 40 mg 1 tablet every other day. 10.Lipitor 80 mg daily at bedtime. 11.Metoprolol tartrate 100 mg twice daily. 12.Nexium 40 mg daily. 13.Niaspan 1000 mg daily. 14.Nitroglycerin sublingual as needed for chest pain. 15.Oxycodone/APAP 5/325 mg 1 tablet every 6 hours as needed.  DISPOSITION:  The patient was seen and examined by Dr. Caryl Dyer on November 21, 2010 and considered stable for discharge.  DURATION OF DISCHARGE ENCOUNTER:  35 minutes.     Roy Marshall, RN,BSN   ______________________________ Roy Sprang, MD, [facility]    AS/MEDQ  D:  11/21/2010  T:  11/21/2010  Job:  [id number]  cc:   Roy Dyer. Roy Cancel, MD, [facility] Roy Dyer, M.D. Roy Sou, MD  Electronically Signed by Roy Dyer RNBSN on 11/23/2010 07:36:10 AM Electronically Signed by Roy Axe MD [place] on 11/30/2010 02:13:08 PM

## 2010-12-04 ENCOUNTER — Encounter: Payer: Self-pay | Admitting: Cardiovascular Disease

## 2010-12-12 ENCOUNTER — Ambulatory Visit (INDEPENDENT_AMBULATORY_CARE_PROVIDER_SITE_OTHER): Payer: Managed Care, Other (non HMO) | Admitting: *Deleted

## 2010-12-12 ENCOUNTER — Encounter: Payer: Self-pay | Admitting: Cardiovascular Disease

## 2010-12-12 ENCOUNTER — Ambulatory Visit (INDEPENDENT_AMBULATORY_CARE_PROVIDER_SITE_OTHER): Payer: Managed Care, Other (non HMO) | Admitting: Cardiovascular Disease

## 2010-12-12 DIAGNOSIS — I513 Intracardiac thrombosis, not elsewhere classified: Secondary | ICD-10-CM

## 2010-12-12 DIAGNOSIS — I2589 Other forms of chronic ischemic heart disease: Secondary | ICD-10-CM

## 2010-12-12 DIAGNOSIS — Z95810 Presence of automatic (implantable) cardiac defibrillator: Secondary | ICD-10-CM

## 2010-12-12 DIAGNOSIS — E782 Mixed hyperlipidemia: Secondary | ICD-10-CM

## 2010-12-12 DIAGNOSIS — I428 Other cardiomyopathies: Secondary | ICD-10-CM

## 2010-12-12 DIAGNOSIS — I219 Acute myocardial infarction, unspecified: Secondary | ICD-10-CM

## 2010-12-12 DIAGNOSIS — I2510 Atherosclerotic heart disease of native coronary artery without angina pectoris: Secondary | ICD-10-CM

## 2010-12-12 DIAGNOSIS — I255 Ischemic cardiomyopathy: Secondary | ICD-10-CM

## 2010-12-12 NOTE — Progress Notes (Signed)
Roy Dyer is a 45 year old male with a history of coronary artery disease who suffered anterior MI in March of 2012. He underwent bare metal stenting to his LAD at that time. Had previous stents to circ and RCA in 2001  At discharge, he was placed on a LifeVest because of depressed LV function. The patient was maintained on optimal medical therapy. His ejection fraction was reassessed 90 days post revascularization and was found to still be less than 35%.  Had BiV AICD placed by Dr [name] 11/20/10.  DFT;s not tested due to history of large mural apical thrombus.  Repeat MRI done  7/6 showed resolution of thrombus and EF 28%.  Will likely need coumadin until a tleast  September. Would keep him on ASA and Plavix with recent MI and 3 stents in heart.    History of CRF  Cr 4.8 on 7/18  F/U by renal per patient in the 3.4 range.  He is taking his Lasix as needed 3-4x/week when weight gets much above 210   ROS: Denies fever, malais, weight loss, blurry vision, decreased visual acuity, cough, sputum, SOB, hemoptysis, pleuritic pain, palpitaitons, heartburn, abdominal pain, melena, lower extremity edema, claudication, or rash.  All other systems reviewed and negative  General: Affect appropriate Healthy:  appears stated age 10: normal Neck supple with no adenopathy JVP normal no bruits no thyromegaly Lungs clear with no wheezing and good diaphragmatic motion Heart:  S1/S2 no murmur,rub, gallop or click PMI normal Abdomen: benighn, BS positve, no tenderness, no AAA no bruit.  No HSM or HJR Distal pulses intact with no bruits No edema Neuro non-focal Skin warm and dry No muscular weakness AICD under right clavicle looks good   Current Outpatient Prescriptions  Medication Sig Dispense Refill  . allopurinol (ZYLOPRIM) 300 MG tablet Take 300 mg by mouth daily.        [name] aspirin 81 MG tablet Take 1 tablet (81 mg total) by mouth daily.      [name] atorvastatin (LIPITOR) 80 MG tablet TAKE 1 TABLET BY MOUTH  EVERY DAY AT BEDTIME  30 tablet  10  . calcitRIOL (ROCALTROL) 0.25 MCG capsule TAKE ONE CAPSULE BY MOUTH EVERY DAY  30 capsule  2  . clopidogrel (PLAVIX) 75 MG tablet Take 1 tablet (75 mg total) by mouth daily.  30 tablet  6  . esomeprazole (NEXIUM) 40 MG capsule Take 40 mg by mouth daily before breakfast.        . FLOVENT HFA 44 MCG/ACT inhaler INHALE 1 PUFF BY MOUTH TWICE A DAY  3 Inhaler  0  . furosemide (LASIX) 40 MG tablet Take 40 mg by mouth every other day. As needed      . hydrALAZINE (APRESOLINE) 25 MG tablet Take 25 mg by mouth 2 (two) times daily.        . isosorbide mononitrate (IMDUR) 30 MG 24 hr tablet Take 1 tablet (30 mg total) by mouth daily.  30 tablet  0  . metoprolol (LOPRESSOR) 100 MG tablet Take 1 tablet (100 mg total) by mouth 2 (two) times daily.  60 tablet  11  . niacin (NIASPAN) 500 MG CR tablet 2 tabs po qhs       . nitroGLYCERIN (NITROSTAT) 0.4 MG SL tablet Place 0.4 mg under the tongue every 5 (five) minutes as needed.        [name] oxyCODONE-acetaminophen (PERCOCET) 5-325 MG per tablet Take 1 tablet by mouth every 4 (four) hours as needed.        [name]  warfarin (COUMADIN) 5 MG tablet UAD         Allergies  Review of patient's allergies indicates no known allergies.  Electrocardiogram:  Assessment and Plan

## 2010-12-12 NOTE — Assessment & Plan Note (Signed)
Stable with no angina and good activity level.  Continue medical Rx Likely liflong ASA and Plavix

## 2010-12-12 NOTE — Assessment & Plan Note (Signed)
Cholesterol is at goal.  Continue current dose of statin and diet Rx.  No myalgias or side effects.  F/U  LFT's in 6 months. Lab Results  Component Value Date   LDLCALC  Value: 55        Total Cholesterol/HDL:CHD Risk Coronary Heart Disease Risk Table                     Men   Women  1/2 Average Risk   3.4   3.3  Average Risk       5.0   4.4  2 X Average Risk   9.6   7.1  3 X Average Risk  23.4   11.0        Use the calculated Patient Ratio above and the CHD Risk Table to determine the patient's CHD Risk.        ATP III CLASSIFICATION (LDL):  <100     mg/dL   Optimal  100-129  mg/dL   Near or Above                    Optimal  130-159  mg/dL   Borderline  160-189  mg/dL   High  >190     mg/dL   Very High 07/04/2010

## 2010-12-12 NOTE — Patient Instructions (Addendum)
Your physician recommends that you schedule a follow-up appointment in: [place] has requested that you have an echocardiogram. Echocardiography is a painless test that uses sound waves to create images of your heart. It provides your doctor with information about the size and shape of your heart and how well your heart's chambers and valves are working. This procedure takes approximately one hour. There are no restrictions for this procedure. SCHEDULE THE SAMEDAY AS APPT WITH NISHAN IN 8-10 WEEKS

## 2010-12-12 NOTE — Assessment & Plan Note (Signed)
Continue Plavix.  Euvolemic and adjusting Lasix based on weight and Cr.

## 2010-12-12 NOTE — Assessment & Plan Note (Signed)
Resolved by MRI 11/18/10.  Cannot have anymore MRI's due to AICD.  F/U contrast echo 8-10 weeks and stop coumadin after 6 months post MI if thrombus gone.

## 2010-12-12 NOTE — Assessment & Plan Note (Signed)
Wound healed well.  Will discuss with SK issue regarding DFT testing

## 2010-12-15 ENCOUNTER — Encounter: Payer: Managed Care, Other (non HMO) | Admitting: *Deleted

## 2010-12-27 ENCOUNTER — Encounter: Payer: Self-pay | Admitting: Cardiovascular Disease

## 2011-01-02 ENCOUNTER — Ambulatory Visit (INDEPENDENT_AMBULATORY_CARE_PROVIDER_SITE_OTHER): Payer: Managed Care, Other (non HMO) | Admitting: *Deleted

## 2011-01-02 DIAGNOSIS — I513 Intracardiac thrombosis, not elsewhere classified: Secondary | ICD-10-CM

## 2011-01-02 DIAGNOSIS — I219 Acute myocardial infarction, unspecified: Secondary | ICD-10-CM

## 2011-01-03 ENCOUNTER — Other Ambulatory Visit: Payer: Self-pay | Admitting: Cardiovascular Disease

## 2011-01-04 ENCOUNTER — Encounter: Payer: Self-pay | Admitting: Family Medicine

## 2011-01-07 ENCOUNTER — Other Ambulatory Visit: Payer: Self-pay | Admitting: Cardiovascular Disease

## 2011-01-08 ENCOUNTER — Telehealth: Payer: Self-pay | Admitting: *Deleted

## 2011-01-08 NOTE — Telephone Encounter (Signed)
Last filled 10/03/10 #30 w/2 refills.  Last seen on 10/23/10, follow up on 02/22/11.  Parathyroid hormone level checked by Dr. Moshe Cipro on 12/27/10 was 81.8.  OK to fill until 10/12?  Please advise.

## 2011-01-09 NOTE — Telephone Encounter (Signed)
Yes, ok to RF x 6.  Thanks--PM

## 2011-01-10 MED ORDER — CALCITRIOL 0.25 MCG PO CAPS: 0.2500 ug | ORAL_CAPSULE | Freq: Every day | ORAL | Status: DC

## 2011-01-10 NOTE — Telephone Encounter (Signed)
RX sent

## 2011-01-30 ENCOUNTER — Ambulatory Visit (INDEPENDENT_AMBULATORY_CARE_PROVIDER_SITE_OTHER): Payer: Managed Care, Other (non HMO) | Admitting: *Deleted

## 2011-01-30 DIAGNOSIS — I513 Intracardiac thrombosis, not elsewhere classified: Secondary | ICD-10-CM

## 2011-01-30 DIAGNOSIS — I219 Acute myocardial infarction, unspecified: Secondary | ICD-10-CM

## 2011-01-30 LAB — POCT INR: INR: 2.4

## 2011-02-07 ENCOUNTER — Other Ambulatory Visit (HOSPITAL_COMMUNITY): Payer: Self-pay | Admitting: Radiology

## 2011-02-07 ENCOUNTER — Ambulatory Visit (INDEPENDENT_AMBULATORY_CARE_PROVIDER_SITE_OTHER): Payer: Managed Care, Other (non HMO) | Admitting: Cardiovascular Disease

## 2011-02-07 ENCOUNTER — Encounter: Payer: Self-pay | Admitting: Cardiovascular Disease

## 2011-02-07 ENCOUNTER — Ambulatory Visit (HOSPITAL_COMMUNITY): Payer: Managed Care, Other (non HMO) | Attending: Cardiovascular Disease | Admitting: Radiology

## 2011-02-07 DIAGNOSIS — I255 Ischemic cardiomyopathy: Secondary | ICD-10-CM

## 2011-02-07 DIAGNOSIS — I2589 Other forms of chronic ischemic heart disease: Secondary | ICD-10-CM

## 2011-02-07 DIAGNOSIS — I513 Intracardiac thrombosis, not elsewhere classified: Secondary | ICD-10-CM

## 2011-02-07 DIAGNOSIS — I10 Essential (primary) hypertension: Secondary | ICD-10-CM | POA: Insufficient documentation

## 2011-02-07 DIAGNOSIS — E785 Hyperlipidemia, unspecified: Secondary | ICD-10-CM | POA: Insufficient documentation

## 2011-02-07 DIAGNOSIS — I079 Rheumatic tricuspid valve disease, unspecified: Secondary | ICD-10-CM | POA: Insufficient documentation

## 2011-02-07 DIAGNOSIS — N189 Chronic kidney disease, unspecified: Secondary | ICD-10-CM

## 2011-02-07 DIAGNOSIS — I2510 Atherosclerotic heart disease of native coronary artery without angina pectoris: Secondary | ICD-10-CM

## 2011-02-07 DIAGNOSIS — I059 Rheumatic mitral valve disease, unspecified: Secondary | ICD-10-CM | POA: Insufficient documentation

## 2011-02-07 DIAGNOSIS — I379 Nonrheumatic pulmonary valve disorder, unspecified: Secondary | ICD-10-CM | POA: Insufficient documentation

## 2011-02-07 DIAGNOSIS — E782 Mixed hyperlipidemia: Secondary | ICD-10-CM

## 2011-02-07 DIAGNOSIS — I252 Old myocardial infarction: Secondary | ICD-10-CM | POA: Insufficient documentation

## 2011-02-07 DIAGNOSIS — I219 Acute myocardial infarction, unspecified: Secondary | ICD-10-CM

## 2011-02-07 MED ORDER — FUROSEMIDE 40 MG PO TABS: ORAL_TABLET | ORAL | Status: DC

## 2011-02-07 MED ORDER — PERFLUTREN PROTEIN A MICROSPH IV SUSP
0.5000 mL | Freq: Once | INTRAVENOUS | Status: AC
  Administered 2011-02-07: 0.5 mL via INTRAVENOUS

## 2011-02-07 NOTE — Assessment & Plan Note (Signed)
Resolved.  6 months of coumadin complete.  D/C coumadin

## 2011-02-07 NOTE — Assessment & Plan Note (Signed)
Cr 3.6  Records have been sent to [facility] regarding possible renal transplant

## 2011-02-07 NOTE — Progress Notes (Signed)
Roy Dyer is a 45 year old male with a history of coronary artery disease who suffered anterior MI in March of 2012. He underwent bare metal stenting to his LAD at that time. Had previous stents to circ and RCA in 2001 At discharge, he was placed on a LifeVest because of depressed LV function. The patient was maintained on optimal medical therapy. His ejection fraction was reassessed 90 days post revascularization and was found to still be less than 35%. Had BiV AICD placed by Dr [name] 11/20/10. DFT;s not tested due to history of large mural apical thrombus. Repeat MRI done 7/6 showed resolution of thrombus and EF 28%.  Would keep him on ASA and Plavix with recent MI and 3 stents in heart.  History of CRF Cr 4.8 on 7/18 F/U by renal per patient in the 3.4 range. He is taking his Lasix as needed 3-4x/week when weight gets much above 210  Reviewed echo from today and EF 35% mod LAE mild MR  Contrast study shows no apical clot.  Will stop coumadin.  Discussed with Dr [name]  DFT;s not tested at implant.  Recent study showing outcomes not different.  But he is young and has LVH so will discuss need fro DFT testing with SK next visit in October  Thinks hydralazine is causing erectile dysfunction.  Will stop and see if it improves.  Will take extrea imdur if BP runs high  ROS: Denies fever, malais, weight loss, blurry vision, decreased visual acuity, cough, sputum, SOB, hemoptysis, pleuritic pain, palpitaitons, heartburn, abdominal pain, melena, lower extremity edema, claudication, or rash.  All other systems reviewed and negative  General: Affect appropriate Healthy:  appears stated age 45: normal Neck supple with no adenopathy JVP normal no bruits no thyromegaly Lungs clear with no wheezing and good diaphragmatic motion Heart:  S1/S2 no murmur,rub, gallop or click PMI normal Abdomen: benighn, BS positve, no tenderness, no AAA no bruit.  No HSM or HJR Distal pulses intact with no bruits No  edema Neuro non-focal Skin warm and dry No muscular weakness   Current Outpatient Prescriptions  Medication Sig Dispense Refill  . allopurinol (ZYLOPRIM) 300 MG tablet Take 300 mg by mouth daily.        [name] aspirin 81 MG tablet Take 1 tablet (81 mg total) by mouth daily.      [name] atorvastatin (LIPITOR) 80 MG tablet TAKE 1 TABLET BY MOUTH EVERY DAY AT BEDTIME  30 tablet  10  . calcitRIOL (ROCALTROL) 0.25 MCG capsule Take 1 capsule (0.25 mcg total) by mouth daily.  30 capsule  5  . clopidogrel (PLAVIX) 75 MG tablet Take 1 tablet (75 mg total) by mouth daily.  30 tablet  6  . esomeprazole (NEXIUM) 40 MG capsule Take 40 mg by mouth daily before breakfast.        . FLOVENT HFA 44 MCG/ACT inhaler INHALE 1 PUFF BY MOUTH TWICE A DAY  3 Inhaler  0  . furosemide (LASIX) 40 MG tablet As needed      . isosorbide mononitrate (IMDUR) 30 MG 24 hr tablet Take 1 tablet (30 mg total) by mouth daily.  30 tablet  0  . metoprolol (LOPRESSOR) 100 MG tablet Take 1 tablet (100 mg total) by mouth 2 (two) times daily.  60 tablet  11  . niacin (NIASPAN) 500 MG CR tablet 2 tabs po qhs       . nitroGLYCERIN (NITROSTAT) 0.4 MG SL tablet Place 0.4 mg under the tongue every 5 (five)  minutes as needed.        [name] oxyCODONE-acetaminophen (PERCOCET) 5-325 MG per tablet Take 1 tablet by mouth every 4 (four) hours as needed.          Allergies  Review of patient's allergies indicates no known allergies.  Electrocardiogram:  Assessment and Plan

## 2011-02-07 NOTE — Patient Instructions (Signed)
Your physician wants you to follow-up in: [place] will receive a reminder letter in the mail two months in advance. If you don't receive a letter, please call our office to schedule the follow-up appointment.  Your physician has recommended you make the following change in your medication: STOP HYDRALAZINE  MAY  TAKE AN EXTRA ISOSORBIDE  AS NEEDED IF B/P  GOES UP.AND STOP COUMADIN

## 2011-02-07 NOTE — Assessment & Plan Note (Signed)
Stop hydralazine due to ED  Will monitor BP and see how he does.

## 2011-02-07 NOTE — Assessment & Plan Note (Signed)
Cholesterol is at goal.  Continue current dose of statin and diet Rx.  No myalgias or side effects.  F/U  LFT's in 6 months. Lab Results  Component Value Date   LDLCALC  Value: 55        Total Cholesterol/HDL:CHD Risk Coronary Heart Disease Risk Table                     Men   Women  1/2 Average Risk   3.4   3.3  Average Risk       5.0   4.4  2 X Average Risk   9.6   7.1  3 X Average Risk  23.4   11.0        Use the calculated Patient Ratio above and the CHD Risk Table to determine the patient's CHD Risk.        ATP III CLASSIFICATION (LDL):  <100     mg/dL   Optimal  100-129  mg/dL   Near or Above                    Optimal  130-159  mg/dL   Borderline  160-189  mg/dL   High  >190     mg/dL   Very High 07/04/2010

## 2011-02-07 NOTE — Assessment & Plan Note (Signed)
No angina  Continue ASA and Plavix

## 2011-02-07 NOTE — Assessment & Plan Note (Signed)
Euvolemic EF 35% by echo today.  Continue current meds

## 2011-02-08 ENCOUNTER — Encounter: Payer: Self-pay | Admitting: Family Medicine

## 2011-02-22 ENCOUNTER — Telehealth: Payer: Self-pay | Admitting: Family Medicine

## 2011-02-22 ENCOUNTER — Ambulatory Visit (INDEPENDENT_AMBULATORY_CARE_PROVIDER_SITE_OTHER): Payer: Managed Care, Other (non HMO) | Admitting: Family Medicine

## 2011-02-22 ENCOUNTER — Encounter: Payer: Self-pay | Admitting: Family Medicine

## 2011-02-22 DIAGNOSIS — I255 Ischemic cardiomyopathy: Secondary | ICD-10-CM

## 2011-02-22 DIAGNOSIS — N189 Chronic kidney disease, unspecified: Secondary | ICD-10-CM

## 2011-02-22 DIAGNOSIS — I10 Essential (primary) hypertension: Secondary | ICD-10-CM

## 2011-02-22 DIAGNOSIS — I2589 Other forms of chronic ischemic heart disease: Secondary | ICD-10-CM

## 2011-02-22 DIAGNOSIS — M329 Systemic lupus erythematosus, unspecified: Secondary | ICD-10-CM

## 2011-02-22 DIAGNOSIS — Z23 Encounter for immunization: Secondary | ICD-10-CM

## 2011-02-22 DIAGNOSIS — N529 Male erectile dysfunction, unspecified: Secondary | ICD-10-CM

## 2011-02-22 DIAGNOSIS — I2510 Atherosclerotic heart disease of native coronary artery without angina pectoris: Secondary | ICD-10-CM

## 2011-02-22 LAB — CBC WITH DIFFERENTIAL/PLATELET
Basophils Absolute: 0 10*3/uL (ref 0.0–0.1)
Basophils Relative: 0.6 % (ref 0.0–3.0)
Hemoglobin: 14.9 g/dL (ref 13.0–17.0)
Lymphocytes Relative: 22.2 % (ref 12.0–46.0)
Monocytes Relative: 5.8 % (ref 3.0–12.0)
Neutro Abs: 4.7 10*3/uL (ref 1.4–7.7)
Neutrophils Relative %: 66.9 % (ref 43.0–77.0)
RBC: 4.57 Mil/uL (ref 4.22–5.81)

## 2011-02-22 LAB — RENAL FUNCTION PANEL
BUN: 36 mg/dL — ABNORMAL HIGH (ref 6–23)
Chloride: 106 mEq/L (ref 96–112)
GFR: 14.61 mL/min — CL (ref >60.00)
Glucose, Bld: 121 mg/dL — ABNORMAL HIGH (ref 70–99)
Phosphorus: 3.6 mg/dL (ref 2.3–4.6)
Potassium: 4.8 mEq/L (ref 3.5–5.1)

## 2011-02-22 LAB — IBC PANEL
Saturation Ratios: 24 % (ref 20.0–50.0)
Transferrin: 190.5 mg/dL — ABNORMAL LOW (ref 212.0–360.0)

## 2011-02-22 LAB — TSH: TSH: 1.07 u[IU]/mL (ref 0.35–5.50)

## 2011-02-22 LAB — FERRITIN: Ferritin: 40.9 ng/mL (ref 22.0–322.0)

## 2011-02-22 MED ORDER — ALLOPURINOL 300 MG PO TABS: 300.0000 mg | ORAL_TABLET | Freq: Every day | ORAL | Status: DC

## 2011-02-22 MED ORDER — FUROSEMIDE 40 MG PO TABS: ORAL_TABLET | ORAL | Status: DC

## 2011-02-22 MED ORDER — ISOSORBIDE MONONITRATE ER 30 MG PO TB24: 30.0000 mg | ORAL_TABLET | Freq: Two times a day (BID) | ORAL | Status: DC

## 2011-02-22 NOTE — Assessment & Plan Note (Signed)
Stable, cont current meds and f/u with cardiologist (s).

## 2011-02-22 NOTE — Assessment & Plan Note (Signed)
Baseline Cr now mid 3's, est CrCl in the 20s---prepping for dialysis in near future. Labs ordered by renal MD were drawn here today per pt request for convenience.

## 2011-02-22 NOTE — Progress Notes (Signed)
Quick Note:  Pls notify patient that his labs were normal except his kidney function, which was stable compared to recent measurement in EMR from July. No changes recommended from my standpoint. Please fax these results to his nephrologist, Dr. Clover Mealy (you have the sheet with the fax # on it). --PM ______

## 2011-02-22 NOTE — Assessment & Plan Note (Signed)
No sign of active disease.

## 2011-02-22 NOTE — Assessment & Plan Note (Signed)
Problem stable.  Continue current medications and diet appropriate for this condition.  We have reviewed our general long term plan for this problem and also reviewed symptoms and signs that should prompt the patient to call or return to the office.  

## 2011-02-22 NOTE — Telephone Encounter (Signed)
Received call from Manuela Schwartz at [facility] lab with critical lab : Cr 4.66. This is fairly stable compared to last Cr in [facility] from 11/2010. Will fax all results to his nephrologist as they come in.--PM

## 2011-02-22 NOTE — Assessment & Plan Note (Signed)
AT this time it doesn't appear to be med related, although he wants to just give it more time off the hydralazine.  TEstosterone normal a few months ago. Discussed option of referral to urologist since he is not a good candidate for viagra-type meds but he declined for now, wants to wait until some of his more pressing/consuming medical issues are quiet.

## 2011-02-22 NOTE — Progress Notes (Signed)
OFFICE NOTE  02/22/2011  CC:  Chief Complaint  Patient presents with  . Follow-up     HPI:   Patient is a 45 y.o. Caucasian male who is here for f/u lupus, CRI, CAD. Feeling well, says "I'm getting along fine".  Recent cardiac f/u showed his LV thrombus had resolved and his coumadin has been stopped.  He sees [name] in the near future for testing of his AICD.  EF remains low: about 35%.  Uses lasix 40mg  qd when he notes that he has gained a few pounds, also when he feels SOB/wheezing that he identifies readily as pulm edema symptoms.  Denies LE edema with volume overload--" I just start getting SOB and frothy wheezing and I take a lasix tab and it clears up".  He denies the kind of wheezing he used to have when he was smoking (quit early 2012), and says he takes flovent on most days as preventative and doesn't even have an albuterol inhaler anymore.  Says he is moving towards getting AV fistula in prep for future dialysis, also will eventually get eval at [facility] for consideration of renal transplant--all this is being done via his local nephrologist, Dr. Clover Mealy.  ED remains a prob despite switching from carvedolol back to his metoprolol.  About 2 wks ago he stopped hydralazine to see if this was the culprit med and notes no difference yet.  Says he had to bump his imdur to 1 tab TWICE per day and bp now stays <140/90.   Pertinent PMH:  Past Medical History  Diagnosis Date  . Lupus   . CAD (coronary artery disease)      stents RCA/Circ 2001, BMS to LAD 07/2010  . CRF (chronic renal failure)       Baseline Cr 3.5 as of 12/2010, Dr. Clover Mealy:  he is being prepped for dialysis and referred to [facility] for transplant consideration.  . Cardiomyopathy     Ischemic:  02/2011 EF 30-35%.   Single chamber ICD 11/20/10 ([name])  . Heart attack 2001 and 2012    3 stents  . Gout   . 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 Left ventricular  thrombus 2012    Re-eval 02/2011 showed thrombus RESOLVED, so coumadin was d/c'd (was on it for 61mo)   Past Surgical History  Procedure Date  . Stents     05-2010 and 2- in 2010  . Partial hip arthroplasty     left  . Renal biopsy   . Tympanostomy tube placement 45 yrs old    MEDS;   Outpatient Prescriptions Prior to Visit  Medication Sig Dispense Refill  . aspirin 81 MG tablet Take 1 tablet (81 mg total) by mouth daily.      [name] atorvastatin (LIPITOR) 80 MG tablet TAKE 1 TABLET BY MOUTH EVERY DAY AT BEDTIME  30 tablet  10  . calcitRIOL (ROCALTROL) 0.25 MCG capsule Take 1 capsule (0.25 mcg total) by mouth daily.  30 capsule  5  . clopidogrel (PLAVIX) 75 MG tablet Take 1 tablet (75 mg total) by mouth daily.  30 tablet  6  . esomeprazole (NEXIUM) 40 MG capsule Take 40 mg by mouth daily before breakfast.        . FLOVENT HFA 44 MCG/ACT inhaler INHALE 1 PUFF BY MOUTH TWICE A DAY  3 Inhaler  0  . metoprolol (LOPRESSOR) 100 MG tablet Take 1 tablet (100 mg total)  by mouth 2 (two) times daily.  60 tablet  11  . niacin (NIASPAN) 500 MG CR tablet 2 tabs po qhs       . nitroGLYCERIN (NITROSTAT) 0.4 MG SL tablet Place 0.4 mg under the tongue every 5 (five) minutes as needed.        [name] oxyCODONE-acetaminophen (PERCOCET) 5-325 MG per tablet Take 1 tablet by mouth every 4 (four) hours as needed.        [name] allopurinol (ZYLOPRIM) 300 MG tablet Take 300 mg by mouth daily.        . furosemide (LASIX) 40 MG tablet As needed      . isosorbide mononitrate (IMDUR) 30 MG 24 hr tablet Take 1 tablet (30 mg total) by mouth daily.  30 tablet  0    PE: Blood pressure 118/84, pulse 80, temperature 97.9 F (36.6 C), temperature source Oral, height 5\' 7"  (1.702 m), weight 210 lb (95.255 kg), SpO2 98.00%. Gen: Alert, well appearing.  Patient is oriented to person, place, time, and situation. NECK: no LAD or TM. Chest: symmetric expansion, nonlabored respirations.  Clear and equal breath sounds in all lung fields.     CV: RRR, no m/r/g.   EXT: no clubbing, cyanosis, or edema.    IMPRESSION AND PLAN:  LUPUS No sign of active disease.  ESSENTIAL HYPERTENSION, BENIGN Problem stable.  Continue current medications and diet appropriate for this condition.  We have reviewed our general long term plan for this problem and also reviewed symptoms and signs that should prompt the patient to call or return to the office.   RENAL FAILURE, CHRONIC Baseline Cr now mid 3's, est CrCl in the 20s---prepping for dialysis in near future. Labs ordered by renal MD were drawn here today per pt request for convenience.  ischemic cardiomyopathy status post anterolateral MI Stable, cont current meds and f/u with cardiologist (s).  Erectile dysfunction AT this time it doesn't appear to be med related, although he wants to just give it more time off the hydralazine.  TEstosterone normal a few months ago. Discussed option of referral to urologist since he is not a good candidate for viagra-type meds but he declined for now, wants to wait until some of his more pressing/consuming medical issues are quiet.   Gout: has been quiescent for years per his report and he has been on allopurinol "forever", with no known dose adjustment or mention of concern from nephrologist.  Will clarify use of this med with his nephrologist, considering dose adjustment for CRI.  Flu vaccine given IM today.  Pt states he has had pneumovax vaccine in the past.  FOLLOW UP:  Return in about 4 months (around 06/25/2011) for f/u HTN, CRI, lupus.

## 2011-02-28 ENCOUNTER — Other Ambulatory Visit: Payer: Self-pay

## 2011-02-28 DIAGNOSIS — N189 Chronic kidney disease, unspecified: Secondary | ICD-10-CM

## 2011-02-28 DIAGNOSIS — Z01810 Encounter for preprocedural cardiovascular examination: Secondary | ICD-10-CM

## 2011-03-07 ENCOUNTER — Encounter: Payer: Self-pay | Admitting: Internal Medicine

## 2011-03-07 ENCOUNTER — Encounter: Payer: Self-pay | Admitting: Family Medicine

## 2011-03-07 ENCOUNTER — Encounter: Payer: Managed Care, Other (non HMO) | Admitting: *Deleted

## 2011-03-07 ENCOUNTER — Ambulatory Visit (INDEPENDENT_AMBULATORY_CARE_PROVIDER_SITE_OTHER): Payer: Managed Care, Other (non HMO) | Admitting: Internal Medicine

## 2011-03-07 DIAGNOSIS — I509 Heart failure, unspecified: Secondary | ICD-10-CM

## 2011-03-07 DIAGNOSIS — I428 Other cardiomyopathies: Secondary | ICD-10-CM

## 2011-03-07 DIAGNOSIS — I498 Other specified cardiac arrhythmias: Secondary | ICD-10-CM

## 2011-03-07 DIAGNOSIS — I255 Ischemic cardiomyopathy: Secondary | ICD-10-CM

## 2011-03-07 DIAGNOSIS — I2589 Other forms of chronic ischemic heart disease: Secondary | ICD-10-CM

## 2011-03-07 DIAGNOSIS — I219 Acute myocardial infarction, unspecified: Secondary | ICD-10-CM

## 2011-03-07 DIAGNOSIS — Z95810 Presence of automatic (implantable) cardiac defibrillator: Secondary | ICD-10-CM | POA: Insufficient documentation

## 2011-03-07 DIAGNOSIS — I513 Intracardiac thrombosis, not elsewhere classified: Secondary | ICD-10-CM

## 2011-03-07 DIAGNOSIS — R000 Tachycardia, unspecified: Secondary | ICD-10-CM

## 2011-03-07 LAB — ICD DEVICE OBSERVATION
BATTERY VOLTAGE: 3.2165 V
BRDY-0002RV: 40 {beats}/min
DEV-0020ICD: NEGATIVE
FVT: 0
PACEART VT: 0
RV LEAD THRESHOLD: 0.75 V
TZAT-0001FASTVT: 1
TZAT-0001SLOWVT: 1
TZAT-0002FASTVT: NEGATIVE
TZAT-0002SLOWVT: NEGATIVE
TZAT-0012SLOWVT: 200 ms
TZAT-0018FASTVT: NEGATIVE
TZAT-0018SLOWVT: NEGATIVE
TZAT-0019FASTVT: 8 V
TZON-0004SLOWVT: 16
TZON-0005SLOWVT: 12
TZST-0001FASTVT: 4
TZST-0001FASTVT: 5
TZST-0001FASTVT: 6
TZST-0001SLOWVT: 2
TZST-0001SLOWVT: 4
TZST-0001SLOWVT: 5
TZST-0002FASTVT: NEGATIVE
TZST-0002FASTVT: NEGATIVE
TZST-0002SLOWVT: NEGATIVE
TZST-0002SLOWVT: NEGATIVE
TZST-0002SLOWVT: NEGATIVE
TZST-0002SLOWVT: NEGATIVE
TZST-0002SLOWVT: NEGATIVE
VF: 0

## 2011-03-07 NOTE — Assessment & Plan Note (Signed)
The patient's device was interrogated.  The information was reviewed. No changes were made in the programming.    

## 2011-03-07 NOTE — Assessment & Plan Note (Signed)
Results, this is the basis for deferring DFT testing. Subsequent to the implant there are data presented supporting the idea of empiric implantation without testing in a primary prevention cohort

## 2011-03-07 NOTE — Progress Notes (Signed)
HPI  Roy Dyer is a 45 y.o. male Seen in followup for ICD implanted for primary prevention. He has primarily an ischemic myopathy status post LAD occlusion and bare-metal stenti with stentng of his LAD with prior stenting of the circumflex and RCA. He has renal insufficiency. He has been referred for placement of a shunt. His energy is quite limited. He wonders about whether his ICD needs to be checked as this was deferred because of the presence of an LV thrombus  Past Medical History  Diagnosis Date  . Lupus   . CAD (coronary artery disease)      stents RCA/Circ 2001, BMS to LAD 07/2010  . CRF (chronic renal failure)       Baseline Cr 3.5 as of 12/2010, Dr. Clover Mealy:  he is being prepped for dialysis and referred to [facility] for transplant consideration.  . Cardiomyopathy     Ischemic:  02/2011 EF 30-35%.   Single chamber ICD 11/20/10 ([name])  . Heart attack 2001 and 2012    3 stents  . Gout   . 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 Left ventricular thrombus 2012    Re-eval 02/2011 showed thrombus RESOLVED, so coumadin was d/c'd (was on it for 56mo)    Past Surgical History  Procedure Date  . Stents     05-2010 and 2- in 2010  . Partial hip arthroplasty     left  . Renal biopsy   . Tympanostomy tube placement 45 yrs old    Current Outpatient Prescriptions  Medication Sig Dispense Refill  . allopurinol (ZYLOPRIM) 300 MG tablet Take 1 tablet (300 mg total) by mouth daily.  30 tablet  10  . aspirin 81 MG tablet Take 1 tablet (81 mg total) by mouth daily.      [name] atorvastatin (LIPITOR) 80 MG tablet TAKE 1 TABLET BY MOUTH EVERY DAY AT BEDTIME  30 tablet  10  . calcitRIOL (ROCALTROL) 0.25 MCG capsule Take 1 capsule (0.25 mcg total) by mouth daily.  30 capsule  5  . clopidogrel (PLAVIX) 75 MG tablet Take 1 tablet (75 mg total) by mouth daily.  30 tablet  6  . esomeprazole (NEXIUM) 40 MG capsule Take 40 mg by mouth daily before  breakfast.        . FLOVENT HFA 44 MCG/ACT inhaler INHALE 1 PUFF BY MOUTH TWICE A DAY  3 Inhaler  0  . furosemide (LASIX) 40 MG tablet 1 tab po qd as needed  30 tablet  6  . isosorbide mononitrate (IMDUR) 30 MG 24 hr tablet Take 1 tablet (30 mg total) by mouth 2 (two) times daily.  60 tablet  10  . metoprolol (LOPRESSOR) 100 MG tablet Take 1 tablet (100 mg total) by mouth 2 (two) times daily.  60 tablet  11  . niacin (NIASPAN) 500 MG CR tablet 2 tabs po qhs       . nitroGLYCERIN (NITROSTAT) 0.4 MG SL tablet Place 0.4 mg under the tongue every 5 (five) minutes as needed.        [name] oxyCODONE-acetaminophen (PERCOCET) 5-325 MG per tablet Take 1 tablet by mouth every 4 (four) hours as needed.          No Known Allergies  Review of Systems negative except from HPI and PMH  Physical Exam Well developed and well nourished in no acute distress HENT normal E scleral and icterus clear Neck Supple  Device pocket is well-healed with a little bit of atrophy. Clear to ausculation Regular rate and rhythm, no murmurs gallops or rub Soft with active bowel sounds No clubbing cyanosis and edema Alert and oriented, grossly normal motor and sensory function Skin Warm and Dry   Assessment and  Plan

## 2011-03-07 NOTE — Assessment & Plan Note (Signed)
He is stable. He is now 6 months post bare-metal stenting. I have asked Dr. Mariana Single as to whether it is appropriate to discontinue his Plavix

## 2011-03-07 NOTE — Patient Instructions (Signed)
Your physician recommends that you schedule a follow-up appointment in:  9 months.  A reminder letter will be sent to you to schedule this appointment.  Continue current medications

## 2011-03-15 ENCOUNTER — Encounter: Payer: Self-pay | Admitting: Vascular Surgery

## 2011-03-19 ENCOUNTER — Encounter: Payer: Self-pay | Admitting: Vascular Surgery

## 2011-03-20 ENCOUNTER — Ambulatory Visit (INDEPENDENT_AMBULATORY_CARE_PROVIDER_SITE_OTHER): Payer: Managed Care, Other (non HMO) | Admitting: Vascular Surgery

## 2011-03-20 ENCOUNTER — Encounter: Payer: Self-pay | Admitting: Vascular Surgery

## 2011-03-20 VITALS — BP 136/87 | HR 98 | Resp 18 | Ht 67.0 in | Wt 210.0 lb

## 2011-03-20 DIAGNOSIS — Z01810 Encounter for preprocedural cardiovascular examination: Secondary | ICD-10-CM

## 2011-03-20 DIAGNOSIS — N184 Chronic kidney disease, stage 4 (severe): Secondary | ICD-10-CM

## 2011-03-20 DIAGNOSIS — N186 End stage renal disease: Secondary | ICD-10-CM

## 2011-03-20 NOTE — Progress Notes (Signed)
[facility] [place]   Patient name: Roy Dyer MRN: [id number] DOB: May 11, 1966 Sex: male   Referred by: Moshe Cipro  Reason for referral:  Chief Complaint  Patient presents with  . New Evaluation    EVAL FOR PERM ACCESS  VEIN MAPPING TODAY    REFERRED BY DR Moshe Cipro    HISTORY OF PRESENT ILLNESS: The patient presents today for discussion of AV access planning. He has had progressive renal insufficiency approaching need for hemodialysis.  Past Medical History  Diagnosis Date  . Lupus   . CAD (coronary artery disease)      stents RCA/Circ 2001, BMS to LAD 07/2010  . CRF (chronic renal failure)       Baseline Cr 3.5 as of 12/2010, Dr. Clover Mealy:  he is being prepped for dialysis and referred to [facility] for transplant consideration.  . Cardiomyopathy     Ischemic:  02/2011 EF 30-35%.   Single chamber ICD 11/20/10 ([name])  . Heart attack 2001 and 2012    3 stents  . Gout   . 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 Left ventricular thrombus 2012    Re-eval 02/2011 showed thrombus RESOLVED, so coumadin was d/c'd (was on it for 77mo)  . implantable cardiac defibrillator single chamber     Medtronic  . Hypertension   . Anemia     Past Surgical History  Procedure Date  . Stents     05-2010 and 2- in 2010  . Partial hip arthroplasty     left  . Renal biopsy   . Tympanostomy tube placement 45 yrs old    History   Social History  . Marital Status: Married    Spouse Name: N/A    Number of Children: N/A  . Years of Education: N/A   Occupational History  . Not on file.   Social History Main Topics  . Smoking status: Former Smoker -- 0.5 packs/day for 30 years    Types: Cigarettes    Quit date: 08/02/2010  . Smokeless tobacco: Never Used  . Alcohol Use: No  . Drug Use: No  . Sexually Active: Yes -- Male partner(s)   Other Topics Concern  . Not on file   Social History Narrative   Married,  44 y/o daught, 16 y/o son. 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    Family History  Problem Relation Age of Onset  . Kidney failure Mother   . Lupus Mother   . Stroke Mother   . Diabetes Mother     type 2  . Heart attack Father     X 7  . Hypertension Father   . Hyperlipidemia Father   . Heart attack Sister 6    X 1  . Hyperlipidemia Sister   . Hypertension Sister   . Heart disease Sister   . Heart attack Paternal Grandfather   . Heart disease Paternal Aunt   . Heart disease Paternal Uncle     Allergies as of 03/20/2011  . (No Known Allergies)    Current Outpatient Prescriptions on File Prior to Visit  Medication Sig Dispense Refill  . allopurinol (ZYLOPRIM) 300 MG tablet Take 1 tablet (300 mg total) by mouth daily.  30 tablet  10  . aspirin 81 MG tablet Take 1 tablet (81 mg total) by mouth daily.      [name] atorvastatin (LIPITOR) 80 MG  tablet TAKE 1 TABLET BY MOUTH EVERY DAY AT BEDTIME  30 tablet  10  . calcitRIOL (ROCALTROL) 0.25 MCG capsule Take 1 capsule (0.25 mcg total) by mouth daily.  30 capsule  5  . clopidogrel (PLAVIX) 75 MG tablet Take 1 tablet (75 mg total) by mouth daily.  30 tablet  6  . esomeprazole (NEXIUM) 40 MG capsule Take 40 mg by mouth daily before breakfast.        . FLOVENT HFA 44 MCG/ACT inhaler INHALE 1 PUFF BY MOUTH TWICE A DAY  3 Inhaler  0  . furosemide (LASIX) 40 MG tablet 1 tab po qd as needed  30 tablet  6  . hydrALAZINE (APRESOLINE) 10 MG tablet Take 10 mg by mouth 3 (three) times daily.        . isosorbide mononitrate (IMDUR) 30 MG 24 hr tablet Take 1 tablet (30 mg total) by mouth 2 (two) times daily.  60 tablet  10  . metoprolol (LOPRESSOR) 100 MG tablet Take 1 tablet (100 mg total) by mouth 2 (two) times daily.  60 tablet  11  . niacin (NIASPAN) 500 MG CR tablet 2 tabs po qhs       . nitroGLYCERIN (NITROSTAT) 0.4 MG SL tablet Place 0.4 mg under the tongue every 5 (five) minutes as needed.         [name] oxyCODONE-acetaminophen (PERCOCET) 5-325 MG per tablet Take 1 tablet by mouth every 4 (four) hours as needed.           REVIEW OF SYSTEMS:  Positives indicated with an "X"  CARDIOVASCULAR:  [ ]  chest pain   [ ]  chest pressure   [ ]  palpitations   [ ]  orthopnea   [x ] dyspnea on exertion   [ ]  claudication   [ ]  rest pain   [ ]  DVT   [ ]  phlebitis PULMONARY:   [ ]  productive cough   [ ]  asthma   [ ]  wheezing NEUROLOGIC:   [ ]  weakness  [ ]  paresthesias  [ ]  aphasia  [ ]  amaurosis  [ ]  dizziness HEMATOLOGIC:   [ ]  bleeding problems   [ ]  clotting disorders MUSCULOSKELETAL:  [x ] joint pain   [ ]  joint swelling GASTROINTESTINAL: [ ]   blood in stool  [ ]   hematemesis GENITOURINARY:  [ ]   dysuria  [ ]   hematuria PSYCHIATRIC:  [ ]  history of major depression INTEGUMENTARY:  [ ]  rashes  [ ]  ulcers CONSTITUTIONAL:  [ ]  fever   [ ]  chills  PHYSICAL EXAMINATION:  General: The patient is a well-nourished male, in no acute distress. Vital signs are BP 136/87  Pulse 98  Resp 18  Ht 5\' 7"  (1.702 m)  Wt 210 lb (95.255 kg)  BMI 32.89 kg/m2 Pulmonary: There is a good air exchange bilaterally without wheezing or rales. Abdomen: Soft and non-tender with normal pitch bowel sounds. Musculoskeletal: There are no major deformities.  There is no significant extremity pain. Neurologic: No focal weakness or paresthesias are detected, Skin: There are no ulcer or rashes noted. Psychiatric: The patient has normal affect. Cardiovascular: There is a regular rate and rhythm without significant murmur appreciated.   [facility] Vascular Lab Studies:  Ordered and Independently Reviewed upper extremity vein map reveals good size cephalic veins throughout his forearm and upper arm bilaterally.  Impression and Plan:  Renal insufficiency approaching need for dialysis. I discussed options to include dialysis catheter AV fistulas and AV grafts I recommend that we  proceed with left wrist Cimino fistula into his  good size by vein map. We have scheduled this at his convenience on 03/29/2011  Roy Dyer is a 45 y.o. male who presents with:  Chief Complaint  Patient presents with  . New Evaluation    EVAL FOR PERM ACCESS  VEIN MAPPING TODAY    REFERRED BY DR Johny Drilling [facility] Office: [phone]

## 2011-03-22 ENCOUNTER — Other Ambulatory Visit: Payer: Managed Care, Other (non HMO)

## 2011-03-22 ENCOUNTER — Ambulatory Visit: Payer: Managed Care, Other (non HMO) | Admitting: Vascular Surgery

## 2011-03-23 ENCOUNTER — Other Ambulatory Visit: Payer: Self-pay | Admitting: *Deleted

## 2011-03-23 ENCOUNTER — Other Ambulatory Visit: Payer: Self-pay | Admitting: Cardiovascular Disease

## 2011-03-27 ENCOUNTER — Encounter (HOSPITAL_COMMUNITY): Payer: Self-pay

## 2011-03-27 MED ORDER — DEXTROSE 5 % IV SOLN
1.5000 g | Freq: Once | INTRAVENOUS | Status: AC
  Administered 2011-03-28: 1.5 g via INTRAVENOUS
  Filled 2011-03-27: qty 1.5

## 2011-03-28 ENCOUNTER — Encounter (HOSPITAL_COMMUNITY): Payer: Self-pay | Admitting: Anesthesiology

## 2011-03-28 ENCOUNTER — Encounter (HOSPITAL_COMMUNITY): Payer: Self-pay | Admitting: *Deleted

## 2011-03-28 ENCOUNTER — Encounter (HOSPITAL_COMMUNITY)
Admission: RE | Disposition: A | Discharge status: Home / Self Care | Payer: Self-pay | Source: Ambulatory Visit | Attending: Vascular Surgery

## 2011-03-28 ENCOUNTER — Ambulatory Visit (HOSPITAL_COMMUNITY)
Admission: RE | Admit: 2011-03-28 | Discharge: 2011-03-28 | Disposition: A | Discharge status: Home / Self Care | Payer: Managed Care, Other (non HMO) | Source: Ambulatory Visit | Attending: Vascular Surgery | Admitting: Vascular Surgery

## 2011-03-28 ENCOUNTER — Ambulatory Visit (HOSPITAL_COMMUNITY): Payer: Managed Care, Other (non HMO)

## 2011-03-28 ENCOUNTER — Ambulatory Visit (HOSPITAL_COMMUNITY): Payer: Managed Care, Other (non HMO) | Admitting: Anesthesiology

## 2011-03-28 DIAGNOSIS — N189 Chronic kidney disease, unspecified: Secondary | ICD-10-CM | POA: Insufficient documentation

## 2011-03-28 DIAGNOSIS — I129 Hypertensive chronic kidney disease with stage 1 through stage 4 chronic kidney disease, or unspecified chronic kidney disease: Secondary | ICD-10-CM | POA: Insufficient documentation

## 2011-03-28 DIAGNOSIS — N289 Disorder of kidney and ureter, unspecified: Secondary | ICD-10-CM

## 2011-03-28 DIAGNOSIS — N186 End stage renal disease: Secondary | ICD-10-CM

## 2011-03-28 HISTORY — DX: Diaphragmatic hernia without obstruction or gangrene: K44.9

## 2011-03-28 HISTORY — DX: Gastro-esophageal reflux disease without esophagitis: K21.9

## 2011-03-28 HISTORY — PX: AV FISTULA PLACEMENT: SHX1204

## 2011-03-28 LAB — CBC
Hemoglobin: 13.8 g/dL (ref 13.0–17.0)
MCV: 93 fL (ref 78.0–100.0)
Platelets: 101 10*3/uL — ABNORMAL LOW (ref 150–400)
RBC: 4.28 MIL/uL (ref 4.22–5.81)
WBC: 7.1 10*3/uL (ref 4.0–10.5)

## 2011-03-28 LAB — DIFFERENTIAL
Lymphocytes Relative: 25 % (ref 12–46)
Lymphs Abs: 1.8 10*3/uL (ref 0.7–4.0)
Neutrophils Relative %: 53 % (ref 43–77)

## 2011-03-28 LAB — BASIC METABOLIC PANEL
CO2: 26 mEq/L (ref 19–32)
Calcium: 9.3 mg/dL (ref 8.4–10.5)
GFR calc non Af Amer: 13 mL/min — ABNORMAL LOW (ref >90)
Potassium: 4.9 mEq/L (ref 3.5–5.1)
Sodium: 141 mEq/L (ref 135–145)

## 2011-03-28 LAB — SURGICAL PCR SCREEN: Staphylococcus aureus: NEGATIVE

## 2011-03-28 SURGERY — ARTERIOVENOUS (AV) FISTULA CREATION
Anesthesia: Monitor Anesthesia Care | Site: Arm Lower | Laterality: Left | Wound class: Clean

## 2011-03-28 MED ORDER — SODIUM CHLORIDE 0.9 % IV SOLN
INTRAVENOUS | Status: DC
  Administered 2011-03-28: 09:00:00 via INTRAVENOUS

## 2011-03-28 MED ORDER — OXYCODONE-ACETAMINOPHEN 5-325 MG PO TABS: 1.0000 | ORAL_TABLET | ORAL | Status: AC | PRN

## 2011-03-28 MED ORDER — LIDOCAINE HCL (CARDIAC) 20 MG/ML IV SOLN
INTRAVENOUS | Status: DC | PRN
  Administered 2011-03-28: 50 mg via INTRAVENOUS

## 2011-03-28 MED ORDER — HYDROMORPHONE HCL PF 1 MG/ML IJ SOLN: 0.2500 mg | INTRAMUSCULAR | Status: DC | PRN

## 2011-03-28 MED ORDER — SODIUM CHLORIDE 0.9 % IR SOLN
Status: DC | PRN
  Administered 2011-03-28: 10:00:00

## 2011-03-28 MED ORDER — FENTANYL CITRATE 0.05 MG/ML IJ SOLN
INTRAMUSCULAR | Status: DC | PRN
  Administered 2011-03-28: 100 ug via INTRAVENOUS

## 2011-03-28 MED ORDER — LIDOCAINE-EPINEPHRINE 0.5-1:200000 % IJ SOLN
INTRAMUSCULAR | Status: DC | PRN
  Administered 2011-03-28: 4 mL

## 2011-03-28 MED ORDER — MUPIROCIN 2 % EX OINT
TOPICAL_OINTMENT | Freq: Two times a day (BID) | CUTANEOUS | Status: DC
  Administered 2011-03-28: 1 via NASAL
  Filled 2011-03-28: qty 22

## 2011-03-28 MED ORDER — MIDAZOLAM HCL 5 MG/5ML IJ SOLN
INTRAMUSCULAR | Status: DC | PRN
  Administered 2011-03-28: 2 mg via INTRAVENOUS

## 2011-03-28 MED ORDER — PROPOFOL 10 MG/ML IV EMUL
INTRAVENOUS | Status: DC | PRN
  Administered 2011-03-28: 50 ug/kg/min via INTRAVENOUS

## 2011-03-28 MED ORDER — SODIUM CHLORIDE 0.9 % IR SOLN
Status: DC | PRN
  Administered 2011-03-28: 1000 mL

## 2011-03-28 SURGICAL SUPPLY — 33 items
BENZOIN TINCTURE PRP APPL 2/3 (GAUZE/BANDAGES/DRESSINGS) ×2 IMPLANT
CANISTER SUCTION 2500CC (MISCELLANEOUS) ×2 IMPLANT
CLOSURE STERI STRIP 1/2 X4 (GAUZE/BANDAGES/DRESSINGS) ×2 IMPLANT
CLOTH BEACON ORANGE TIMEOUT ST (SAFETY) ×2 IMPLANT
COVER PROBE W GEL 5X96 (DRAPES) IMPLANT
COVER SURGICAL LIGHT HANDLE (MISCELLANEOUS) ×4 IMPLANT
DECANTER SPIKE VIAL GLASS SM (MISCELLANEOUS) ×2 IMPLANT
ELECT REM PT RETURN 9FT ADLT (ELECTROSURGICAL) ×2
ELECTRODE REM PT RTRN 9FT ADLT (ELECTROSURGICAL) ×1 IMPLANT
GEL ULTRASOUND 20GR AQUASONIC (MISCELLANEOUS) IMPLANT
GLOVE BIOGEL PI IND STRL 6.5 (GLOVE) ×1 IMPLANT
GLOVE BIOGEL PI IND STRL 7.5 (GLOVE) ×1 IMPLANT
GLOVE BIOGEL PI INDICATOR 6.5 (GLOVE) ×1
GLOVE BIOGEL PI INDICATOR 7.5 (GLOVE) ×1
GLOVE ECLIPSE 6.0 STRL STRAW (GLOVE) ×2 IMPLANT
GLOVE SS BIOGEL STRL SZ 7.5 (GLOVE) ×1 IMPLANT
GLOVE SUPERSENSE BIOGEL SZ 7.5 (GLOVE) ×1
GOWN STRL NON-REIN LRG LVL3 (GOWN DISPOSABLE) ×4 IMPLANT
KIT BASIN OR (CUSTOM PROCEDURE TRAY) ×2 IMPLANT
KIT ROOM TURNOVER OR (KITS) ×2 IMPLANT
NS IRRIG 1000ML POUR BTL (IV SOLUTION) ×2 IMPLANT
PACK CV ACCESS (CUSTOM PROCEDURE TRAY) ×2 IMPLANT
PAD ARMBOARD 7.5X6 YLW CONV (MISCELLANEOUS) ×2 IMPLANT
SPONGE GAUZE 4X4 12PLY (GAUZE/BANDAGES/DRESSINGS) ×2 IMPLANT
STRIP CLOSURE SKIN 1/2X4 (GAUZE/BANDAGES/DRESSINGS) ×2 IMPLANT
SUT PROLENE 6 0 CC (SUTURE) ×2 IMPLANT
SUT VIC AB 3-0 SH 27 (SUTURE) ×1
SUT VIC AB 3-0 SH 27X BRD (SUTURE) ×1 IMPLANT
TAPE CLOTH SURG 4X10 WHT LF (GAUZE/BANDAGES/DRESSINGS) ×2 IMPLANT
TOWEL OR 17X24 6PK STRL BLUE (TOWEL DISPOSABLE) ×2 IMPLANT
TOWEL OR 17X26 10 PK STRL BLUE (TOWEL DISPOSABLE) ×2 IMPLANT
UNDERPAD 30X30 INCONTINENT (UNDERPADS AND DIAPERS) ×2 IMPLANT
WATER STERILE IRR 1000ML POUR (IV SOLUTION) ×2 IMPLANT

## 2011-03-28 NOTE — H&P (View-Only) (Signed)
[facility] [place]   Patient name: Roy Dyer MRN: [id number] DOB: [birth year] Sex: male   Referred by: Moshe Cipro  Reason for referral:  Chief Complaint  Patient presents with  . New Evaluation    EVAL FOR PERM ACCESS  VEIN MAPPING TODAY    REFERRED BY DR Moshe Cipro    HISTORY OF PRESENT ILLNESS: The patient presents today for discussion of AV access planning. He has had progressive renal insufficiency approaching need for hemodialysis.  Past Medical History  Diagnosis Date  . Lupus   . CAD (coronary artery disease)      stents RCA/Circ 2001, BMS to LAD 07/2010  . CRF (chronic renal failure)       Baseline Cr 3.5 as of 12/2010, Dr. Clover Mealy:  he is being prepped for dialysis and referred to [facility] for transplant consideration.  . Cardiomyopathy     Ischemic:  02/2011 EF 30-35%.   Single chamber ICD 11/20/10 ([name])  . Heart attack 2001 and 2012    3 stents  . Gout   . 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 Left ventricular thrombus 2012    Re-eval 02/2011 showed thrombus RESOLVED, so coumadin was d/c'd (was on it for 53mo)  . implantable cardiac defibrillator single chamber     Medtronic  . Hypertension   . Anemia     Past Surgical History  Procedure Date  . Stents     05-2010 and 2- in 2010  . Partial hip arthroplasty     left  . Renal biopsy   . Tympanostomy tube placement 45 yrs old    History   Social History  . Marital Status: Married    Spouse Name: N/A    Number of Children: N/A  . Years of Education: N/A   Occupational History  . Not on file.   Social History Main Topics  . Smoking status: Former Smoker -- 0.5 packs/day for 30 years    Types: Cigarettes    Quit date: 08/02/2010  . Smokeless tobacco: Never Used  . Alcohol Use: No  . Drug Use: No  . Sexually Active: Yes -- Male partner(s)   Other Topics Concern  . Not on file   Social History Narrative   Married,  44 y/o daught, 31 y/o son. 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    Family History  Problem Relation Age of Onset  . Kidney failure Mother   . Lupus Mother   . Stroke Mother   . Diabetes Mother     type 2  . Heart attack Father     X 7  . Hypertension Father   . Hyperlipidemia Father   . Heart attack Sister 97    X 1  . Hyperlipidemia Sister   . Hypertension Sister   . Heart disease Sister   . Heart attack Paternal Grandfather   . Heart disease Paternal Aunt   . Heart disease Paternal Uncle     Allergies as of 03/20/2011  . (No Known Allergies)    Current Outpatient Prescriptions on File Prior to Visit  Medication Sig Dispense Refill  . allopurinol (ZYLOPRIM) 300 MG tablet Take 1 tablet (300 mg total) by mouth daily.  30 tablet  10  . aspirin 81 MG tablet Take 1 tablet (81 mg total) by mouth daily.      [name] atorvastatin (LIPITOR) 80 MG  tablet TAKE 1 TABLET BY MOUTH EVERY DAY AT BEDTIME  30 tablet  10  . calcitRIOL (ROCALTROL) 0.25 MCG capsule Take 1 capsule (0.25 mcg total) by mouth daily.  30 capsule  5  . clopidogrel (PLAVIX) 75 MG tablet Take 1 tablet (75 mg total) by mouth daily.  30 tablet  6  . esomeprazole (NEXIUM) 40 MG capsule Take 40 mg by mouth daily before breakfast.        . FLOVENT HFA 44 MCG/ACT inhaler INHALE 1 PUFF BY MOUTH TWICE A DAY  3 Inhaler  0  . furosemide (LASIX) 40 MG tablet 1 tab po qd as needed  30 tablet  6  . hydrALAZINE (APRESOLINE) 10 MG tablet Take 10 mg by mouth 3 (three) times daily.        . isosorbide mononitrate (IMDUR) 30 MG 24 hr tablet Take 1 tablet (30 mg total) by mouth 2 (two) times daily.  60 tablet  10  . metoprolol (LOPRESSOR) 100 MG tablet Take 1 tablet (100 mg total) by mouth 2 (two) times daily.  60 tablet  11  . niacin (NIASPAN) 500 MG CR tablet 2 tabs po qhs       . nitroGLYCERIN (NITROSTAT) 0.4 MG SL tablet Place 0.4 mg under the tongue every 5 (five) minutes as needed.         [name] oxyCODONE-acetaminophen (PERCOCET) 5-325 MG per tablet Take 1 tablet by mouth every 4 (four) hours as needed.           REVIEW OF SYSTEMS:  Positives indicated with an "X"  CARDIOVASCULAR:  [ ]  chest pain   [ ]  chest pressure   [ ]  palpitations   [ ]  orthopnea   [x ] dyspnea on exertion   [ ]  claudication   [ ]  rest pain   [ ]  DVT   [ ]  phlebitis PULMONARY:   [ ]  productive cough   [ ]  asthma   [ ]  wheezing NEUROLOGIC:   [ ]  weakness  [ ]  paresthesias  [ ]  aphasia  [ ]  amaurosis  [ ]  dizziness HEMATOLOGIC:   [ ]  bleeding problems   [ ]  clotting disorders MUSCULOSKELETAL:  [x ] joint pain   [ ]  joint swelling GASTROINTESTINAL: [ ]   blood in stool  [ ]   hematemesis GENITOURINARY:  [ ]   dysuria  [ ]   hematuria PSYCHIATRIC:  [ ]  history of major depression INTEGUMENTARY:  [ ]  rashes  [ ]  ulcers CONSTITUTIONAL:  [ ]  fever   [ ]  chills  PHYSICAL EXAMINATION:  General: The patient is a well-nourished male, in no acute distress. Vital signs are BP 136/87  Pulse 98  Resp 18  Ht 5\' 7"  (1.702 m)  Wt 210 lb (95.255 kg)  BMI 32.89 kg/m2 Pulmonary: There is a good air exchange bilaterally without wheezing or rales. Abdomen: Soft and non-tender with normal pitch bowel sounds. Musculoskeletal: There are no major deformities.  There is no significant extremity pain. Neurologic: No focal weakness or paresthesias are detected, Skin: There are no ulcer or rashes noted. Psychiatric: The patient has normal affect. Cardiovascular: There is a regular rate and rhythm without significant murmur appreciated.   VVS Vascular Lab Studies:  Ordered and Independently Reviewed upper extremity vein map reveals good size cephalic veins throughout his forearm and upper arm bilaterally.  Impression and Plan:  Renal insufficiency approaching need for dialysis. I discussed options to include dialysis catheter AV fistulas and AV grafts I recommend that we  proceed with left wrist Cimino fistula into his  good size by vein map. We have scheduled this at his convenience on 03/29/2011  Roy Dyer is a 45 y.o. male who presents with:  Chief Complaint  Patient presents with  . New Evaluation    EVAL FOR PERM ACCESS  VEIN MAPPING TODAY    REFERRED BY DR Johny Drilling [facility] Office: [phone]

## 2011-03-28 NOTE — Procedures (Unsigned)
CEPHALIC VEIN MAPPING  INDICATION:  Chronic kidney disease, stage IV.  HISTORY:  EXAM: The right cephalic vein is compressible.  Diameter measurements range from 0.40 to 0.62 cm.  The right basilic vein is compressible.  Diameter measurements range from 0.56 to 0.66 cm.  The left cephalic vein is compressible.  Diameter measurements range from 0.16 to 0.56 cm.  The basilic vein is compressible.  Diameter measurements range from 0.47 to 0.71 cm.  See attached worksheet for all measurements.  IMPRESSION:  Patent bilateral cephalic vein and basilic veins with diameter measurements as described above.  ___________________________________________ Rosetta Posner, M.D.  SH/MEDQ  D:  03/21/2011  T:  03/21/2011  Job:  [id number]

## 2011-03-28 NOTE — Anesthesia Preprocedure Evaluation (Addendum)
Anesthesia Evaluation  Patient identified by MRN, date of birth, ID band Patient awake    Reviewed: Allergy & Precautions, H&P , NPO status , Patient's Chart, lab work & pertinent test results, reviewed documented beta blocker date and time   Airway Mallampati: II TM Distance: >3 FB     Dental   Pulmonary    Pulmonary exam normal       Cardiovascular hypertension, Pt. on medications + CAD and + Past MI + Cardiac Defibrillator     Neuro/Psych  Neuromuscular disease    GI/Hepatic hiatal hernia, GERD-  ,  Endo/Other    Renal/GU CRFRenal disease     Musculoskeletal   Abdominal   Peds  Hematology   Anesthesia Other Findings   Reproductive/Obstetrics                          Anesthesia Physical Anesthesia Plan  ASA: III  Anesthesia Plan:    Post-op Pain Management:    Induction:   Airway Management Planned: Mask  Additional Equipment:   Intra-op Plan:   Post-operative Plan:   Informed Consent: I have reviewed the patients History and Physical, chart, labs and discussed the procedure including the risks, benefits and alternatives for the proposed anesthesia with the patient or authorized representative who has indicated his/her understanding and acceptance.     Plan Discussed with: Surgeon and CRNA  Anesthesia Plan Comments:        Anesthesia Quick Evaluation

## 2011-03-28 NOTE — Progress Notes (Signed)
[employer] rep here to interrogate pt's ICD.

## 2011-03-28 NOTE — Brief Op Note (Signed)
03/28/2011  10:30 AM  PATIENT:  Roy Dyer  45 y.o. male  PRE-OPERATIVE DIAGNOSIS:  ESRD  POST-OPERATIVE DIAGNOSIS:  ESRD  PROCEDURE:  Procedure(s): ARTERIOVENOUS (AV) FISTULA CREATION left wrist  SURGEON:  Surgeon(s): Rosetta Posner, MD     ASSISTANTS: nurse   ANESTHESIA:   local and IV sedation  EBL:  Total I/O In: 250 [I.V.:250] Out: -   BLOOD ADMINISTERED:none  DRAINS: none   LOCAL MEDICATIONS USED:  LIDOCAINE 4 CC  SPECIMEN:  No Specimen  DISPOSITION OF SPECIMEN:  N/A  COUNTS:  YES  TOURNIQUET:  * No tourniquets in log *  DICTATION: .Other Dictation: Dictation Number [id number]  PLAN OF CARE: Discharge to home after PACU  PATIENT DISPOSITION:  PACU - hemodynamically stable.   Delay start of Pharmacological VTE agent (>24hrs) due to surgical blood loss or risk of bleeding:  {YES/NO/NOT APPLICABLE:20182

## 2011-03-28 NOTE — Interval H&P Note (Signed)
History and Physical Interval Note:   03/28/2011   8:46 AM   Roy Dyer  has presented today for surgery, with the diagnosis of ESRD  The various methods of treatment have been discussed with the patient and family. After consideration of risks, benefits and other options for treatment, the patient has consented to  Procedure(s): ARTERIOVENOUS (AV) FISTULA CREATION as a surgical intervention .  The patients' history has been reviewed, patient examined, no change in status, stable for surgery.  I have reviewed the patients' chart and labs.  Questions were answered to the patient's satisfaction.     Roy Posner  MD

## 2011-03-28 NOTE — Progress Notes (Signed)
[employer] has cleared pt's ICD for discharge.

## 2011-03-28 NOTE — Op Note (Signed)
NAMEKEANEN, THIESEN NO.:  [id number]  MEDICAL RECORD NO.:  [id number]  LOCATION:  [facility]                         FACILITY:  [facility]  PHYSICIAN:  Rosetta Posner, M.D.    DATE OF BIRTH:  02/28/1966  DATE OF PROCEDURE:  03/28/2011 DATE OF DISCHARGE:                              OPERATIVE REPORT   PREOPERATIVE DIAGNOSIS:  Chronic renal insufficiency.  POSTOPERATIVE DIAGNOSIS:  Chronic renal insufficiency.  PROCEDURE:  Left wrist Cimino AV fistula creation.  SURGEON:  Rosetta Posner, MD  ASSISTANT:  Nurse.  ANESTHESIA:  MAC.  COMPLICATIONS:  None.  DISPOSITION:  Stable.  PROCEDURE IN DETAIL:  The patient was taken to the operating room, placed in a supine position.  The area of the left arm prepped and draped in usual sterile fashion.  Ultrasound revealed level of the cephalic vein, which had been made preoperatively.  The patient had a moderate-sized cephalic vein.  Using local anesthesia, an incision was made between the level of the cephalic vein at the wrist and the radial artery.  The cephalic vein was mobilized, tributary branches were ligated with 3-0 and 4-0 silk ties, and divided.  The vein was marked to prevent twisting and was ligated distally and divided.  The vein was brought to approximation radial artery, which was of good caliber.  The artery was occluded proximally and distally with 11-blade senility Potts scissors.  The vein was cut to the appropriate length and sewn end-to- side to the artery with running 6-0 Prolene suture.  Clamps were removed and good thrill was noted.  The wounds were irrigated with saline, hemostasis with electrocautery.  Wounds were closed with 3-0 Vicryl in the subcutaneous and subcuticular tissue.  Benzoin and Steri-Strips were applied.     Rosetta Posner, M.D.     TFE/MEDQ  D:  03/28/2011  T:  03/28/2011  Job:  [id number]

## 2011-03-28 NOTE — Transfer of Care (Signed)
Immediate Anesthesia Transfer of Care Note  Patient: Roy Dyer  Procedure(s) Performed:  ARTERIOVENOUS (AV) FISTULA CREATION - Creation of Left radiocephallic cimino fistula  Patient Location: PACU  Anesthesia Type: MAC combined with regional for post-op pain  Level of Consciousness: alert  and oriented  Airway & Oxygen Therapy: Patient connected to nasal cannula oxygen  Post-op Assessment: Report given to PACU RN and Post -op Vital signs reviewed and stable  Post vital signs: Reviewed and stable  Complications: No apparent anesthesia complications

## 2011-03-28 NOTE — Progress Notes (Signed)
[employer] Rep notified at 0730 this AM ,stated to call when pt arrives in holding area phone [phone]

## 2011-03-28 NOTE — Preoperative (Addendum)
Beta Blockers   Reason not to administer Beta Blockers:Not Applicable. Lopressor 100mg  @0530  03/28/11

## 2011-03-28 NOTE — Anesthesia Postprocedure Evaluation (Signed)
  Anesthesia Post-op Note  Patient: Roy Dyer  Procedure(s) Performed:  ARTERIOVENOUS (AV) FISTULA CREATION - Creation of Left radiocephallic cimino fistula  Patient Location: PACU  Anesthesia Type: MAC  Level of Consciousness: awake, alert  and oriented  Airway and Oxygen Therapy: Patient Spontanous Breathing  Post-op Pain: mild  Post-op Assessment: Post-op Vital signs reviewed   Post-op Vital Signs: stable  Complications: No apparent anesthesia complications

## 2011-03-29 ENCOUNTER — Encounter (HOSPITAL_COMMUNITY): Payer: Self-pay | Admitting: Vascular Surgery

## 2011-04-11 ENCOUNTER — Ambulatory Visit (INDEPENDENT_AMBULATORY_CARE_PROVIDER_SITE_OTHER): Payer: Managed Care, Other (non HMO) | Admitting: Thoracic Diseases

## 2011-04-11 ENCOUNTER — Encounter: Payer: Self-pay | Admitting: Thoracic Diseases

## 2011-04-11 VITALS — BP 137/83 | HR 75 | Temp 97.8°F | Resp 24 | Ht 67.0 in | Wt 209.0 lb

## 2011-04-11 DIAGNOSIS — N186 End stage renal disease: Secondary | ICD-10-CM

## 2011-04-11 DIAGNOSIS — M7989 Other specified soft tissue disorders: Secondary | ICD-10-CM

## 2011-04-11 NOTE — Progress Notes (Signed)
[facility]  Postoperative Visit hemodialysis access   Surgeon: Bonita Quin, MD Procedure: creation left radiocephalic AVF  HPI: Roy Dyer is a 45 y.o. male who is 2 weeks S/P creation/revision of left upper extremity Hemodialysis access. The patient denies symptoms of numbness, tingling, weakness and denies pain in the operative limb. He does have some numbness at base of the thumb sec to cutaneous nerve inj with incision. Patient is here for post -op evaluation to assess healing and maturation of left radiocephalic AVF . He had developed some swelling and redness at the incision site which has gone down considerably over the past day.  Pt is not on hemodialysis   Physical Examination  Filed Vitals:   04/11/11 1552  BP: 137/83  Pulse: 75  Temp: 97.8 F (36.6 C)  Resp: 24    WDWN male in NAD.  left upper extremity Incision is mildly erythematous and healed with no hematoma or fluctuance. It is not tender Skin color is normal   Hand grip is 5/5 and sensation in digits is intact; There is a no thrill and no bruit in the AVF but I was able to hear good doppler flow in the fistula. The graft/fistula is not easily palpable and of inadequate size  Assessment/Plan JAKSYN FESER is a 45 y.o. year old who is s/p creation/revision of left upper extremity Hemodialysis access. Follow-up in 2 weeks with Dr. Donnetta Hutching to assess function. This fistula has only doppler flow and he may need a new fistula placed  Clinic MD: CS Scot Dock, MD

## 2011-04-17 ENCOUNTER — Other Ambulatory Visit: Payer: Self-pay | Admitting: Family Medicine

## 2011-04-17 DIAGNOSIS — M329 Systemic lupus erythematosus, unspecified: Secondary | ICD-10-CM

## 2011-04-17 DIAGNOSIS — N186 End stage renal disease: Secondary | ICD-10-CM

## 2011-04-23 ENCOUNTER — Encounter: Payer: Self-pay | Admitting: Vascular Surgery

## 2011-04-23 ENCOUNTER — Other Ambulatory Visit (INDEPENDENT_AMBULATORY_CARE_PROVIDER_SITE_OTHER): Payer: Managed Care, Other (non HMO)

## 2011-04-23 DIAGNOSIS — N186 End stage renal disease: Secondary | ICD-10-CM

## 2011-04-23 DIAGNOSIS — M329 Systemic lupus erythematosus, unspecified: Secondary | ICD-10-CM

## 2011-04-23 LAB — RENAL FUNCTION PANEL
BUN: 44 mg/dL — ABNORMAL HIGH (ref 6–23)
CO2: 24 mEq/L (ref 19–32)
Calcium: 8.9 mg/dL (ref 8.4–10.5)
Creatinine, Ser: 4.4 mg/dL — ABNORMAL HIGH (ref 0.4–1.5)
GFR: 15.48 mL/min — ABNORMAL LOW (ref >60.00)

## 2011-04-23 LAB — IBC PANEL
Iron: 90 ug/dL (ref 42–165)
Saturation Ratios: 38.9 % (ref 20.0–50.0)
Transferrin: 165.3 mg/dL — ABNORMAL LOW (ref 212.0–360.0)

## 2011-04-23 LAB — FERRITIN: Ferritin: 50.3 ng/mL (ref 22.0–322.0)

## 2011-04-24 ENCOUNTER — Ambulatory Visit (INDEPENDENT_AMBULATORY_CARE_PROVIDER_SITE_OTHER): Payer: Managed Care, Other (non HMO) | Admitting: Vascular Surgery

## 2011-04-24 ENCOUNTER — Encounter: Payer: Self-pay | Admitting: Vascular Surgery

## 2011-04-24 ENCOUNTER — Encounter: Payer: Self-pay | Admitting: Family Medicine

## 2011-04-24 ENCOUNTER — Other Ambulatory Visit: Payer: Self-pay

## 2011-04-24 VITALS — BP 142/95 | HR 65 | Resp 16 | Ht 67.0 in | Wt 209.0 lb

## 2011-04-24 DIAGNOSIS — N186 End stage renal disease: Secondary | ICD-10-CM

## 2011-04-24 DIAGNOSIS — R739 Hyperglycemia, unspecified: Secondary | ICD-10-CM | POA: Insufficient documentation

## 2011-04-24 DIAGNOSIS — Z87448 Personal history of other diseases of urinary system: Secondary | ICD-10-CM

## 2011-04-24 HISTORY — DX: Personal history of other diseases of urinary system: Z87.448

## 2011-04-24 NOTE — Progress Notes (Signed)
The patient presents today for continued followup of his left wrist fistula created on 03/28/2011. His incision is well healed and he has minimal discomfort associated with this. He does have a bruit over the anastomosis but minimal flow in the vein above this. I imaged the area with the SonoSite ultrasound. This shows occlusion of the cephalic vein several centimeters above the anastomosis and then reopening via collateral in the mid upper arm. The vein remained small. On imaging his above elbow cephalic vein is small above the antecubital vein area. I imaged his basilic vein on the left and this is of better caliber. I again discussed options with the patient and have recommended basilic vein transposition on the left arm is the next option I do not filter in the options of improving his left Cimino AV fistula. He understands and wishes to proceed as soon as possible we have scheduled this is an outpatient on 04/27/2011. We will make a decision as to far as one stage versus two-stage transposition depending on the size of his basilic vein at surgery

## 2011-04-25 ENCOUNTER — Encounter (HOSPITAL_COMMUNITY): Payer: Self-pay

## 2011-04-25 LAB — HEMOGLOBIN A1C: Hgb A1c MFr Bld: 5.6 % (ref 4.6–6.5)

## 2011-04-26 ENCOUNTER — Encounter (HOSPITAL_COMMUNITY): Payer: Self-pay | Admitting: *Deleted

## 2011-04-26 ENCOUNTER — Encounter: Payer: Self-pay | Admitting: Family Medicine

## 2011-04-26 MED ORDER — DEXTROSE 5 % IV SOLN
1.5000 g | INTRAVENOUS | Status: AC
  Administered 2011-04-27: 1.5 g via INTRAVENOUS
  Filled 2011-04-26: qty 1.5

## 2011-04-26 NOTE — Progress Notes (Signed)
Results released to MyChart--PM

## 2011-04-26 NOTE — Progress Notes (Addendum)
Faxed ICD form to [facility] device clinic. Requested last stress test and cardiac cath from same. ECHO from 9/12 available in EPIC.   Correction - ECHO, Stress test, Cardiac cath all available in EPIC.

## 2011-04-27 ENCOUNTER — Encounter (HOSPITAL_COMMUNITY): Payer: Self-pay | Admitting: *Deleted

## 2011-04-27 ENCOUNTER — Ambulatory Visit (HOSPITAL_COMMUNITY): Payer: Managed Care, Other (non HMO) | Admitting: Anesthesiology

## 2011-04-27 ENCOUNTER — Encounter (HOSPITAL_COMMUNITY): Payer: Self-pay | Admitting: Anesthesiology

## 2011-04-27 ENCOUNTER — Encounter (HOSPITAL_COMMUNITY)
Admission: RE | Disposition: A | Discharge status: Home / Self Care | Payer: Self-pay | Source: Ambulatory Visit | Attending: Vascular Surgery

## 2011-04-27 ENCOUNTER — Ambulatory Visit (HOSPITAL_COMMUNITY)
Admission: RE | Admit: 2011-04-27 | Discharge: 2011-04-27 | Disposition: A | Discharge status: Home / Self Care | Payer: Managed Care, Other (non HMO) | Source: Ambulatory Visit | Attending: Vascular Surgery | Admitting: Vascular Surgery

## 2011-04-27 DIAGNOSIS — I252 Old myocardial infarction: Secondary | ICD-10-CM | POA: Insufficient documentation

## 2011-04-27 DIAGNOSIS — Z87891 Personal history of nicotine dependence: Secondary | ICD-10-CM | POA: Insufficient documentation

## 2011-04-27 DIAGNOSIS — K219 Gastro-esophageal reflux disease without esophagitis: Secondary | ICD-10-CM | POA: Insufficient documentation

## 2011-04-27 DIAGNOSIS — Z9861 Coronary angioplasty status: Secondary | ICD-10-CM | POA: Insufficient documentation

## 2011-04-27 DIAGNOSIS — K449 Diaphragmatic hernia without obstruction or gangrene: Secondary | ICD-10-CM | POA: Insufficient documentation

## 2011-04-27 DIAGNOSIS — N186 End stage renal disease: Secondary | ICD-10-CM | POA: Insufficient documentation

## 2011-04-27 DIAGNOSIS — I120 Hypertensive chronic kidney disease with stage 5 chronic kidney disease or end stage renal disease: Secondary | ICD-10-CM | POA: Insufficient documentation

## 2011-04-27 DIAGNOSIS — Z95810 Presence of automatic (implantable) cardiac defibrillator: Secondary | ICD-10-CM | POA: Insufficient documentation

## 2011-04-27 DIAGNOSIS — I2510 Atherosclerotic heart disease of native coronary artery without angina pectoris: Secondary | ICD-10-CM | POA: Insufficient documentation

## 2011-04-27 HISTORY — PX: AV FISTULA PLACEMENT: SHX1204

## 2011-04-27 LAB — POCT I-STAT 4, (NA,K, GLUC, HGB,HCT)
Glucose, Bld: 99 mg/dL (ref 70–99)
HCT: 41 % (ref 39.0–52.0)
Hemoglobin: 13.9 g/dL (ref 13.0–17.0)
Potassium: 4.8 mEq/L (ref 3.5–5.1)
Sodium: 138 mEq/L (ref 135–145)

## 2011-04-27 LAB — PROTIME-INR
INR: 1.03 (ref 0.00–1.49)
Prothrombin Time: 13.7 seconds (ref 11.6–15.2)

## 2011-04-27 SURGERY — ARTERIOVENOUS (AV) FISTULA CREATION
Anesthesia: General | Site: Arm Upper | Laterality: Left | Wound class: Clean

## 2011-04-27 MED ORDER — EPHEDRINE SULFATE 50 MG/ML IJ SOLN
INTRAMUSCULAR | Status: DC | PRN
  Administered 2011-04-27: 10 mg via INTRAVENOUS
  Administered 2011-04-27: 5 mg via INTRAVENOUS

## 2011-04-27 MED ORDER — SODIUM CHLORIDE 0.9 % IR SOLN
Status: DC | PRN
  Administered 2011-04-27: 1000 mL

## 2011-04-27 MED ORDER — LIDOCAINE HCL (CARDIAC) 20 MG/ML IV SOLN
INTRAVENOUS | Status: DC | PRN
  Administered 2011-04-27: 100 mg via INTRAVENOUS

## 2011-04-27 MED ORDER — ONDANSETRON HCL 4 MG/2ML IJ SOLN
INTRAMUSCULAR | Status: DC | PRN
  Administered 2011-04-27: 4 mg via INTRAVENOUS

## 2011-04-27 MED ORDER — PHENYLEPHRINE HCL 10 MG/ML IJ SOLN
INTRAMUSCULAR | Status: DC | PRN
  Administered 2011-04-27: 80 ug via INTRAVENOUS
  Administered 2011-04-27: 40 ug via INTRAVENOUS
  Administered 2011-04-27 (×3): 80 ug via INTRAVENOUS

## 2011-04-27 MED ORDER — ONDANSETRON HCL 4 MG/2ML IJ SOLN: 4.0000 mg | Freq: Once | INTRAMUSCULAR | Status: DC | PRN

## 2011-04-27 MED ORDER — SODIUM CHLORIDE 0.9 % IV SOLN
INTRAVENOUS | Status: DC
  Administered 2011-04-27 (×2): via INTRAVENOUS

## 2011-04-27 MED ORDER — MIDAZOLAM HCL 5 MG/5ML IJ SOLN
INTRAMUSCULAR | Status: DC | PRN
  Administered 2011-04-27: 1 mg via INTRAVENOUS

## 2011-04-27 MED ORDER — FENTANYL CITRATE 0.05 MG/ML IJ SOLN
INTRAMUSCULAR | Status: DC | PRN
  Administered 2011-04-27 (×2): 25 ug via INTRAVENOUS
  Administered 2011-04-27: 75 ug via INTRAVENOUS
  Administered 2011-04-27: 25 ug via INTRAVENOUS
  Administered 2011-04-27: 50 ug via INTRAVENOUS

## 2011-04-27 MED ORDER — HYDROMORPHONE HCL PF 1 MG/ML IJ SOLN
0.2500 mg | INTRAMUSCULAR | Status: DC | PRN
  Administered 2011-04-27 (×2): 0.5 mg via INTRAVENOUS

## 2011-04-27 MED ORDER — PROPOFOL 10 MG/ML IV EMUL
INTRAVENOUS | Status: DC | PRN
  Administered 2011-04-27: 50 mg via INTRAVENOUS
  Administered 2011-04-27: 200 mg via INTRAVENOUS

## 2011-04-27 MED ORDER — SODIUM CHLORIDE 0.9 % IR SOLN
Status: DC | PRN
  Administered 2011-04-27: 14:00:00

## 2011-04-27 SURGICAL SUPPLY — 37 items
BENZOIN TINCTURE PRP APPL 2/3 (GAUZE/BANDAGES/DRESSINGS) ×2 IMPLANT
CANISTER SUCTION 2500CC (MISCELLANEOUS) ×2 IMPLANT
CLIP LIGATING EXTRA MED SLVR (CLIP) ×2 IMPLANT
CLIP LIGATING EXTRA SM BLUE (MISCELLANEOUS) ×2 IMPLANT
CLOSURE STERI STRIP 1/2 X4 (GAUZE/BANDAGES/DRESSINGS) ×2 IMPLANT
CLOTH BEACON ORANGE TIMEOUT ST (SAFETY) ×2 IMPLANT
COVER PROBE W GEL 5X96 (DRAPES) ×2 IMPLANT
COVER SURGICAL LIGHT HANDLE (MISCELLANEOUS) ×4 IMPLANT
DECANTER SPIKE VIAL GLASS SM (MISCELLANEOUS) IMPLANT
ELECT REM PT RETURN 9FT ADLT (ELECTROSURGICAL) ×2
ELECTRODE REM PT RTRN 9FT ADLT (ELECTROSURGICAL) ×1 IMPLANT
GEL ULTRASOUND 20GR AQUASONIC (MISCELLANEOUS) IMPLANT
GLOVE BIOGEL PI IND STRL 6.5 (GLOVE) ×4 IMPLANT
GLOVE BIOGEL PI INDICATOR 6.5 (GLOVE) ×4
GLOVE ECLIPSE 6.5 STRL STRAW (GLOVE) ×6 IMPLANT
GLOVE SS BIOGEL STRL SZ 7.5 (GLOVE) ×1 IMPLANT
GLOVE SUPERSENSE BIOGEL SZ 7.5 (GLOVE) ×1
GOWN STRL NON-REIN LRG LVL3 (GOWN DISPOSABLE) ×4 IMPLANT
KIT BASIN OR (CUSTOM PROCEDURE TRAY) ×2 IMPLANT
KIT ROOM TURNOVER OR (KITS) ×2 IMPLANT
NS IRRIG 1000ML POUR BTL (IV SOLUTION) ×2 IMPLANT
PACK CV ACCESS (CUSTOM PROCEDURE TRAY) ×2 IMPLANT
PAD ARMBOARD 7.5X6 YLW CONV (MISCELLANEOUS) ×4 IMPLANT
SPONGE GAUZE 4X4 12PLY (GAUZE/BANDAGES/DRESSINGS) ×2 IMPLANT
STRIP CLOSURE SKIN 1/2X4 (GAUZE/BANDAGES/DRESSINGS) ×2 IMPLANT
SUT PROLENE 6 0 CC (SUTURE) ×2 IMPLANT
SUT SILK 2 0 SH (SUTURE) ×2 IMPLANT
SUT SILK 3 0 (SUTURE) ×1
SUT SILK 3-0 18XBRD TIE 12 (SUTURE) ×1 IMPLANT
SUT VIC AB 3-0 SH 27 (SUTURE) ×3
SUT VIC AB 3-0 SH 27X BRD (SUTURE) ×3 IMPLANT
SUT VICRYL 4-0 PS2 18IN ABS (SUTURE) ×4 IMPLANT
TAPE CLOTH SURG 4X10 WHT LF (GAUZE/BANDAGES/DRESSINGS) ×2 IMPLANT
TOWEL OR 17X24 6PK STRL BLUE (TOWEL DISPOSABLE) ×2 IMPLANT
TOWEL OR 17X26 10 PK STRL BLUE (TOWEL DISPOSABLE) ×2 IMPLANT
UNDERPAD 30X30 INCONTINENT (UNDERPADS AND DIAPERS) ×2 IMPLANT
WATER STERILE IRR 1000ML POUR (IV SOLUTION) ×2 IMPLANT

## 2011-04-27 NOTE — Preoperative (Signed)
Beta Blockers   Reason not to administer Beta Blockers:Patient took metoprolol 12/14 am

## 2011-04-27 NOTE — H&P (View-Only) (Signed)
The patient presents today for continued followup of his left wrist fistula created on 03/28/2011. His incision is well healed and he has minimal discomfort associated with this. He does have a bruit over the anastomosis but minimal flow in the vein above this. I imaged the area with the SonoSite ultrasound. This shows occlusion of the cephalic vein several centimeters above the anastomosis and then reopening via collateral in the mid upper arm. The vein remained small. On imaging his above elbow cephalic vein is small above the antecubital vein area. I imaged his basilic vein on the left and this is of better caliber. I again discussed options with the patient and have recommended basilic vein transposition on the left arm is the next option I do not filter in the options of improving his left Cimino AV fistula. He understands and wishes to proceed as soon as possible we have scheduled this is an outpatient on 04/27/2011. We will make a decision as to far as one stage versus two-stage transposition depending on the size of his basilic vein at surgery

## 2011-04-27 NOTE — Transfer of Care (Signed)
Immediate Anesthesia Transfer of Care Note  Patient: Roy Dyer  Procedure(s) Performed:  ARTERIOVENOUS (AV) FISTULA CREATION - left basilic vein transposition  Patient Location: PACU  Anesthesia Type: General  Level of Consciousness: awake, alert , oriented and patient cooperative  Airway & Oxygen Therapy: Patient Spontanous Breathing and Patient connected to nasal cannula oxygen  Post-op Assessment: Report given to PACU RN, Post -op Vital signs reviewed and stable and Patient moving all extremities  Post vital signs: Reviewed and stable  Complications: No apparent anesthesia complications

## 2011-04-27 NOTE — Progress Notes (Signed)
icd interrogated. No problems.

## 2011-04-27 NOTE — Progress Notes (Signed)
Dr. Tamala Julian notified of ICD and returned cardiologist form to use Magnet. Tomi Bamberger with [employer] is aware of patient and can be called if needed.  Myra Gianotti PA reviewed EKGs from April/May-patient had surgery with these 03/2011-ok to proceed per [facility].

## 2011-04-27 NOTE — OR Nursing (Signed)
Anesthesia placed a Magnet over AICD during the surgical procedure/AMasonRN

## 2011-04-27 NOTE — Anesthesia Preprocedure Evaluation (Addendum)
Anesthesia Evaluation  Patient identified by MRN, date of birth, ID band Patient awake    Reviewed: Allergy & Precautions, H&P , NPO status , Patient's Chart, lab work & pertinent test results  Airway Mallampati: I TM Distance: >3 FB Neck ROM: full    Dental  (+) Teeth Intact   Pulmonary neg pulmonary ROS, former smoker   Pulmonary exam normal       Cardiovascular hypertension, + CAD, + Past MI and + Cardiac Stents + Cardiac Defibrillator + Valvular Problems/Murmurs regular Normal    Neuro/Psych PSYCHIATRIC DISORDERS  Neuromuscular disease    GI/Hepatic Neg liver ROS, hiatal hernia, GERD-  ,  Endo/Other    Renal/GU ESRF and Dialysis  Genitourinary negative   Musculoskeletal   Abdominal Normal abdominal exam  (+)   Peds  Hematology negative hematology ROS (+)   Anesthesia Other Findings   Reproductive/Obstetrics                          Anesthesia Physical Anesthesia Plan  ASA: III  Anesthesia Plan: General LMA   Post-op Pain Management:    Induction: Intravenous  Airway Management Planned: LMA  Additional Equipment:   Intra-op Plan:   Post-operative Plan: Extubation in OR  Informed Consent: I have reviewed the patients History and Physical, chart, labs and discussed the procedure including the risks, benefits and alternatives for the proposed anesthesia with the patient or authorized representative who has indicated his/her understanding and acceptance.     Plan Discussed with: Surgeon, CRNA and Anesthesiologist  Anesthesia Plan Comments:         Anesthesia Quick Evaluation

## 2011-04-27 NOTE — Op Note (Signed)
OPERATIVE REPORT  DATE OF SURGERY: 04/27/2011  PATIENT: Roy Dyer, 45 y.o. male MRN: [id number]  DOB: 06-12-1965  PRE-OPERATIVE DIAGNOSIS: Chronic renal insufficiency  POST-OPERATIVE DIAGNOSIS:  Same  PROCEDURE: Left upper arm basilic vein transposition  SURGEON:  Curt Jews, M.D.    ANESTHESIA:  Gen.  EBL: Minimal ml  Total I/O In: 500 [I.V.:500] Out: -   BLOOD ADMINISTERED: None none  DRAINS: None  SPECIMEN: None  COUNTS CORRECT:  YES  PLAN OF CARE: PACU stable   PATIENT DISPOSITION:  PACU - hemodynamically stable  PROCEDURE DETAILS: The patient was taken to the operating room placed supine position where the area of the left arm left lower prepped and draped in usual sterile fashion. Ultrasound was used to visualize the basilic vein which was of very good caliber throughout its course. The decision was made not to states that to do a single basilic vein transposition operation. Incision was made over the antecubital space and the basilic vein was exposed. Additional incision was made in the mid upper arm and the third incision was made near the shoulder. The basilic vein branches were ligated with 30 and 4-0 silk ties and divided. It was of excellent caliber throughout its course. The vein was ligated distally with 2-0 silk ties and divided. This was mobilized through the, up to the axilla so that the vein was outside of the arm except at its attachment and the axillary vein. The vein had been marked to prevent twisting prior moving it from the tunnel. The vein was gently dilated and was excellent caliber. A Gore, were used to make a tunnel between the level of the antecubital space and the axillary vein venous attachment. The basilic vein was brought through the tunnel to the level of the brachial artery. The brachial artery was excellent caliber. The artery was occluded proximal and distally was a blunt lens enlarged with Potts scissors. The vein was divided spatulated  and sewn end to side to the artery with a running 6-0 Prolene suture. Clamps were removed and excellent thrill was noted. The wound irrigated with saline hemostasis the cautery wounds were closed with 3-0 Vicryl the subcutaneous tissue subcuticular tissue and insertions were applied.   Curt Jews, M.D. 04/27/2011 3:05 PM

## 2011-04-27 NOTE — Interval H&P Note (Signed)
History and Physical Interval Note:  04/27/2011 12:42 PM  Roy Dyer  has presented today for surgery, with the diagnosis of End Stage Renal Disease  The various methods of treatment have been discussed with the patient and family. After consideration of risks, benefits and other options for treatment, the patient has consented to  Procedure(s): ARTERIOVENOUS (AV) FISTULA CREATION as a surgical intervention .  The patients' history has been reviewed, patient examined, no change in status, stable for surgery.  I have reviewed the patients' chart and labs.  Questions were answered to the patient's satisfaction.     Rosetta Posner

## 2011-04-27 NOTE — Progress Notes (Signed)
Spoke with Myra Gianotti PA regarding 03/2011 CXR-okay to use this for OR.

## 2011-05-01 ENCOUNTER — Encounter (HOSPITAL_COMMUNITY): Payer: Self-pay | Admitting: Vascular Surgery

## 2011-05-07 NOTE — Anesthesia Postprocedure Evaluation (Signed)
  Anesthesia Post-op Note  Patient: [name]  Procedure(s) Performed:  ARTERIOVENOUS (AV) FISTULA CREATION - left basilic vein transposition  Patient Location: PACU  Anesthesia Type: General  Level of Consciousness: awake, oriented, sedated and patient cooperative  Airway and Oxygen Therapy: Patient Spontanous Breathing and Patient connected to nasal cannula oxygen  Post-op Pain: mild  Post-op Assessment: Post-op Vital signs reviewed, Patient's Cardiovascular Status Stable, Respiratory Function Stable, Patent Airway, No signs of Nausea or vomiting and Pain level controlled  Post-op Vital Signs: stable  Complications: No apparent anesthesia complications

## 2011-05-18 ENCOUNTER — Telehealth: Payer: Self-pay | Admitting: Cardiovascular Disease

## 2011-05-18 ENCOUNTER — Other Ambulatory Visit: Payer: Self-pay | Admitting: Cardiovascular Disease

## 2011-05-18 NOTE — Telephone Encounter (Signed)
New Msg: pt calling needing surgical clearance for hip replacement. please return pt call to discuss further.

## 2011-05-18 NOTE — Telephone Encounter (Signed)
Follow-up:    Patient returned your phone call.

## 2011-05-18 NOTE — Telephone Encounter (Signed)
MAY PT PROCEED WITH HIP SX ? PT AWARE WILL FORWARD TO DR Johnsie Cancel FOR REVIEW .CY

## 2011-05-20 NOTE — Telephone Encounter (Signed)
Ok to stop plavix and proceed with surgery

## 2011-05-21 ENCOUNTER — Encounter: Payer: Self-pay | Admitting: *Deleted

## 2011-05-21 NOTE — Telephone Encounter (Signed)
PT AWARE MAY PROCEED WITH SX NOTE SENT TO [facility]CY

## 2011-05-28 ENCOUNTER — Encounter: Payer: Self-pay | Admitting: Vascular Surgery

## 2011-05-29 ENCOUNTER — Encounter: Payer: Self-pay | Admitting: Family Medicine

## 2011-05-29 ENCOUNTER — Ambulatory Visit (INDEPENDENT_AMBULATORY_CARE_PROVIDER_SITE_OTHER): Payer: Managed Care, Other (non HMO) | Admitting: Vascular Surgery

## 2011-05-29 ENCOUNTER — Encounter: Payer: Self-pay | Admitting: Vascular Surgery

## 2011-05-29 VITALS — BP 130/80 | HR 70 | Resp 16 | Ht 67.0 in | Wt 202.0 lb

## 2011-05-29 DIAGNOSIS — N186 End stage renal disease: Secondary | ICD-10-CM

## 2011-05-29 NOTE — Progress Notes (Signed)
The patient presents today for followup of his left arm basilic vein transposition on 04/27/2011 he had a failed Cimino fistula attempt in November of 2012. He's had minimal discomfort with the upper arm basilic vein transposition. His incisions are all healed quite nicely. He has excellent Ahniya Mitchum maturation of his basilic vein transposition with excellent thrill and good size development. The fistula is superficial and straight. Facility has an excellent chance of successful use of this fistula when he approaches need for hemodialysis. He reports that he is scheduled for hip replacement approximately one month and is concern regarding worsening renal function at that time. Timing of his hip surgeries approximately 10 weeks following basilic vein transposition. I feel that if he does need access should be available at that time he will followup with [place] on an as-needed basis

## 2011-06-07 ENCOUNTER — Encounter: Payer: Self-pay | Admitting: Internal Medicine

## 2011-06-07 ENCOUNTER — Ambulatory Visit (INDEPENDENT_AMBULATORY_CARE_PROVIDER_SITE_OTHER): Payer: Managed Care, Other (non HMO) | Admitting: *Deleted

## 2011-06-07 DIAGNOSIS — I2589 Other forms of chronic ischemic heart disease: Secondary | ICD-10-CM

## 2011-06-07 DIAGNOSIS — R000 Tachycardia, unspecified: Secondary | ICD-10-CM

## 2011-06-07 DIAGNOSIS — Z95810 Presence of automatic (implantable) cardiac defibrillator: Secondary | ICD-10-CM

## 2011-06-07 DIAGNOSIS — I509 Heart failure, unspecified: Secondary | ICD-10-CM

## 2011-06-09 LAB — REMOTE ICD DEVICE
BATTERY VOLTAGE: 3.2097 V
BRDY-0002RV: 40 {beats}/min
CHARGE TIME: 8.648 s
RV LEAD AMPLITUDE: 13.4 mv
RV LEAD IMPEDENCE ICD: 456 Ohm
RV LEAD THRESHOLD: 0.875 V
TOT-0006: 20120709000000
TZAT-0001FASTVT: 1
TZAT-0012FASTVT: 200 ms
TZAT-0019SLOWVT: 8 V
TZAT-0020FASTVT: 1.5 ms
TZAT-0020SLOWVT: 1.5 ms
TZON-0003SLOWVT: 360 ms
TZON-0003VSLOWVT: 350 ms
TZON-0004SLOWVT: 16
TZON-0004VSLOWVT: 32
TZON-0005SLOWVT: 12
TZST-0001FASTVT: 2
TZST-0001FASTVT: 3
TZST-0001FASTVT: 4
TZST-0001SLOWVT: 2
TZST-0001SLOWVT: 3
TZST-0001SLOWVT: 5
TZST-0002FASTVT: NEGATIVE
TZST-0002FASTVT: NEGATIVE
TZST-0002SLOWVT: NEGATIVE
TZST-0002SLOWVT: NEGATIVE
TZST-0002SLOWVT: NEGATIVE
VF: 0

## 2011-06-12 ENCOUNTER — Encounter: Payer: Self-pay | Admitting: *Deleted

## 2011-06-12 NOTE — Progress Notes (Signed)
Remote icd check w/icm  

## 2011-06-13 ENCOUNTER — Other Ambulatory Visit: Payer: Self-pay | Admitting: Orthopedic Surgery

## 2011-06-19 ENCOUNTER — Other Ambulatory Visit: Payer: Self-pay | Admitting: Cardiovascular Disease

## 2011-06-20 ENCOUNTER — Encounter: Payer: Self-pay | Admitting: Family Medicine

## 2011-06-20 ENCOUNTER — Encounter: Payer: Self-pay | Admitting: *Deleted

## 2011-06-20 MED ORDER — FLUTICASONE PROPIONATE HFA 44 MCG/ACT IN AERO: 1.0000 | INHALATION_SPRAY | Freq: Two times a day (BID) | RESPIRATORY_TRACT | Status: DC

## 2011-06-20 NOTE — Telephone Encounter (Signed)
Pt has appt on 2/11.  1 inhaler sent to pharmacy.  To discuss use at next visit.

## 2011-06-20 NOTE — Telephone Encounter (Signed)
Encounter created in error

## 2011-06-25 ENCOUNTER — Encounter: Payer: Self-pay | Admitting: Family Medicine

## 2011-06-25 ENCOUNTER — Ambulatory Visit (INDEPENDENT_AMBULATORY_CARE_PROVIDER_SITE_OTHER): Payer: Managed Care, Other (non HMO) | Admitting: Family Medicine

## 2011-06-25 DIAGNOSIS — I7300 Raynaud's syndrome without gangrene: Secondary | ICD-10-CM

## 2011-06-25 DIAGNOSIS — Z1211 Encounter for screening for malignant neoplasm of colon: Secondary | ICD-10-CM

## 2011-06-25 DIAGNOSIS — N189 Chronic kidney disease, unspecified: Secondary | ICD-10-CM

## 2011-06-25 DIAGNOSIS — IMO0002 Reserved for concepts with insufficient information to code with codable children: Secondary | ICD-10-CM

## 2011-06-25 DIAGNOSIS — K219 Gastro-esophageal reflux disease without esophagitis: Secondary | ICD-10-CM

## 2011-06-25 DIAGNOSIS — N186 End stage renal disease: Secondary | ICD-10-CM

## 2011-06-25 DIAGNOSIS — E782 Mixed hyperlipidemia: Secondary | ICD-10-CM

## 2011-06-25 DIAGNOSIS — Z111 Encounter for screening for respiratory tuberculosis: Secondary | ICD-10-CM

## 2011-06-25 DIAGNOSIS — M329 Systemic lupus erythematosus, unspecified: Secondary | ICD-10-CM

## 2011-06-25 DIAGNOSIS — M87059 Idiopathic aseptic necrosis of unspecified femur: Secondary | ICD-10-CM

## 2011-06-25 DIAGNOSIS — I10 Essential (primary) hypertension: Secondary | ICD-10-CM

## 2011-06-25 MED ORDER — NIFEDIPINE 10 MG PO CAPS: 10.0000 mg | ORAL_CAPSULE | Freq: Three times a day (TID) | ORAL | Status: DC

## 2011-06-25 MED ORDER — DEXLANSOPRAZOLE 60 MG PO CPDR: 60.0000 mg | DELAYED_RELEASE_CAPSULE | Freq: Every day | ORAL | Status: DC

## 2011-06-25 NOTE — Assessment & Plan Note (Signed)
Problem stable.  Continue current medications and diet appropriate for this condition.  We have reviewed our general long term plan for this problem and also reviewed symptoms and signs that should prompt the patient to call or return to the office.  

## 2011-06-25 NOTE — Assessment & Plan Note (Signed)
Surgery for total hip replacement on right scheduled 07/09/11 with Dr. Mayer Camel.

## 2011-06-25 NOTE — Assessment & Plan Note (Addendum)
For consideration of renal transplant at [facility]--eval coming up after his hip surgery.  Today we'll do TB skin test. PSA was done 10/2010.  We'll refer to GI for colonoscopy as per his paperwork requirements. Will check fasting labs in 2d when he returns for recheck of his TB skin test. He also has AV fistula in place in case he needs to start dialysis soon.  Currently he's on maximum antiplatelet therapy for this : plavix and ASA. I did change his nexium to dexilant, which has minimal effect on conversion of plavix to it's active metabolite.  Samples x 10d given today, plus rx sent to pharmacy.

## 2011-06-25 NOTE — Assessment & Plan Note (Signed)
I think it would be ok to start nifedipine 10mg  tid.

## 2011-06-25 NOTE — Assessment & Plan Note (Signed)
No active disease at this time.

## 2011-06-25 NOTE — Progress Notes (Signed)
OFFICE VISIT  06/25/2011   CC:  Chief Complaint  Patient presents with  . Follow-up    HTN, CRI, lupus     HPI:    Patient is a 46 y.o. Caucasian male who presents for routine f/u: lupus, CRF, HTN, GERD, hyperlipidemia. Not fasting today.  Unfortunately he has to get another total hip replacement 07/09/11--this time the right side.  Has some paperwork for prep for renal transplant eval at [facility]: needs TB skin test, referral to GI for colonoscopy, PSA test.    Asks about nexium/plavix interaction--?need to change to different PPI.  He is happy with his response to nexium, but understands that if a switch is needed to help make sure plavix works well then he'll comply.  BP checks at home are borderline: 140/90 range, some a bit lower in mornings.  Has appt with nephrologist next week and was hoping to get labs done here and have results faxed to their office as per our routine.    He reports hx of raynaud's disease that has resulted in ulcer on fingertip (index finger right hand) in the distant past.  Sx's had been quiescent for months/years up until the last few weeks when he began to note a numb feeling on tip of right index finger and some color changes to white/pallor of this finger at different periods throughout the day.  Was on nifedipine in the past and it helped.  Past Medical History  Diagnosis Date  . Lupus   . CAD (coronary artery disease)      stents RCA/Circ 2001, BMS to LAD 07/2010  . CRF (chronic renal failure)       Baseline Cr 4.5 as of 04/2011 (GFR<20), Dr. Clover Mealy:  he is being prepped for dialysis and referred to [facility] for transplant consideration.  . Cardiomyopathy     Ischemic:  02/2011 EF 30-35%.   Single chamber ICD 11/20/10 ([name])  . Gout   . 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 Left ventricular thrombus 2012    Re-eval 02/2011 showed thrombus RESOLVED, so coumadin was d/c'd (was on it for 20mo)  .  implantable cardiac defibrillator single chamber     Medtronic  . Hypertension   . Anemia   . Heart attack 2001 and 2012    3 stents and defib placed in July  . GERD (gastroesophageal reflux disease)   . Hiatal hernia     Past Surgical History  Procedure Date  . Stents     05-2010 and 2- in 2010  . Partial hip arthroplasty     left  . Renal biopsy   . Tympanostomy tube placement 46 yrs old  . Joint replacement   . Insert / replace / remove pacemaker   . Av fistula placement 03/28/2011    Procedure: ARTERIOVENOUS (AV) FISTULA CREATION;  Surgeon: Rosetta Posner, MD;  Location: [facility];  Service: Vascular;  Laterality: Left;  Creation of Left radiocephallic cimino fistula  . Cardiac catheterization     08/2010  . US echocardiography 12/2010  . Cardiovascular stress test 07/2010  . Av fistula placement 04/27/2011    Procedure: ARTERIOVENOUS (AV) FISTULA CREATION;  Surgeon: Rosetta Posner, MD;  Location: [facility];  Service: Vascular;  Laterality: Left;  left basilic vein transposition    Outpatient Prescriptions Prior to Visit  Medication Sig Dispense Refill  . allopurinol (ZYLOPRIM) 300 MG tablet Take 1 tablet (300 mg  total) by mouth daily.  30 tablet  10  . aspirin 81 MG tablet Take 1 tablet (81 mg total) by mouth daily.      [name] atorvastatin (LIPITOR) 80 MG tablet TAKE 1 TABLET BY MOUTH EVERY DAY AT BEDTIME  30 tablet  10  . calcitRIOL (ROCALTROL) 0.25 MCG capsule Take 1 capsule (0.25 mcg total) by mouth daily.  30 capsule  5  . clopidogrel (PLAVIX) 75 MG tablet TAKE 1 TABLET (75 MG TOTAL) BY MOUTH DAILY.  30 tablet  6  . esomeprazole (NEXIUM) 40 MG capsule Take 40 mg by mouth daily before breakfast.        . fluticasone (FLOVENT HFA) 44 MCG/ACT inhaler Inhale 1 puff into the lungs 2 (two) times daily.  1 Inhaler  0  . furosemide (LASIX) 40 MG tablet Take 40 mg by mouth daily as needed. For fluid.      . isosorbide mononitrate (IMDUR) 30 MG 24 hr tablet Take 1 tablet (30 mg total) by mouth 2  (two) times daily.  60 tablet  10  . metoprolol (LOPRESSOR) 100 MG tablet Take 1 tablet (100 mg total) by mouth 2 (two) times daily.  60 tablet  11  . niacin (NIASPAN) 500 MG CR tablet Take 1,000 mg by mouth at bedtime.       . nitroGLYCERIN (NITROSTAT) 0.4 MG SL tablet Place 0.4 mg under the tongue every 5 (five) minutes as needed.        . traMADol (ULTRAM) 50 MG tablet Take 50 mg by mouth every 6 (six) hours as needed.      [name] oxyCODONE-acetaminophen (PERCOCET) 5-325 MG per tablet Take 1 tablet by mouth Once daily as needed. For pain        No Known Allergies  ROS As per HPI  PE: Blood pressure 128/86, pulse 77, height 5\' 7"  (1.702 m), weight 203 lb (92.08 kg). Gen: Alert, well appearing.  Patient is oriented to person, place, time, and situation. ENT: Ears: EACs clear, normal epithelium.  TMs with good light reflex and landmarks bilaterally.  Eyes: no injection, icteris, swelling, or exudate.  EOMI, PERRLA. Nose: no drainage or turbinate edema/swelling.  No injection or focal lesion.  Mouth: lips without lesion/swelling.  Oral mucosa pink and moist.  Dentition intact and without obvious caries or gingival swelling.  Oropharynx without erythema, exudate, or swelling.  Neck - No masses or thyromegaly or limitation in range of motion CV: RRR, no m/r/g.   LUNGS: CTA bilat, nonlabored resps, good aeration in all lung fields. EXT: no clubbing, cyanosis, or edema.    LABS:  None today  IMPRESSION AND PLAN:  End stage renal disease For consideration of renal transplant at [facility]--eval coming up after his hip surgery.  Today we'll do TB skin test. PSA was done 10/2010.  We'll refer to GI for colonoscopy as per his paperwork requirements. Will check fasting labs in 2d when he returns for recheck of his TB skin test. He also has AV fistula in place in case he needs to start dialysis soon.  Currently he's on maximum antiplatelet therapy for this : plavix and ASA. I did change his nexium to  dexilant, which has minimal effect on conversion of plavix to it's active metabolite.  Samples x 10d given today, plus rx sent to pharmacy.  ESSENTIAL HYPERTENSION, BENIGN Problem stable.  Continue current medications and diet appropriate for this condition.  We have reviewed our general long term plan for this problem and also  reviewed symptoms and signs that should prompt the patient to call or return to the office.   LUPUS No active disease at this time.  MIXED HYPERLIPIDEMIA Return when fasting for FLP. Continue current meds at this time.  Avascular necrosis of femoral head Surgery for total hip replacement on right scheduled 07/09/11 with Dr. Mayer Camel.  Raynaud's disease I think it would be ok to start nifedipine 10mg  tid.     FOLLOW UP: Return in about 4 months (around 10/23/2011) for for o/v with me.  Return in 2d for lab visit (fasting labs ordered) and read TB test.

## 2011-06-25 NOTE — Assessment & Plan Note (Signed)
Return when fasting for FLP. Continue current meds at this time.

## 2011-06-27 ENCOUNTER — Encounter: Payer: Self-pay | Admitting: Internal Medicine

## 2011-06-27 ENCOUNTER — Other Ambulatory Visit (INDEPENDENT_AMBULATORY_CARE_PROVIDER_SITE_OTHER): Payer: Managed Care, Other (non HMO)

## 2011-06-27 ENCOUNTER — Telehealth: Payer: Self-pay

## 2011-06-27 DIAGNOSIS — N189 Chronic kidney disease, unspecified: Secondary | ICD-10-CM

## 2011-06-27 DIAGNOSIS — K219 Gastro-esophageal reflux disease without esophagitis: Secondary | ICD-10-CM

## 2011-06-27 DIAGNOSIS — N186 End stage renal disease: Secondary | ICD-10-CM

## 2011-06-27 DIAGNOSIS — Z111 Encounter for screening for respiratory tuberculosis: Secondary | ICD-10-CM

## 2011-06-27 DIAGNOSIS — I7300 Raynaud's syndrome without gangrene: Secondary | ICD-10-CM

## 2011-06-27 DIAGNOSIS — Z1211 Encounter for screening for malignant neoplasm of colon: Secondary | ICD-10-CM

## 2011-06-27 DIAGNOSIS — M329 Systemic lupus erythematosus, unspecified: Secondary | ICD-10-CM

## 2011-06-27 LAB — CBC WITH DIFFERENTIAL/PLATELET
Basophils Relative: 0.7 % (ref 0.0–3.0)
Eosinophils Relative: 3 % (ref 0.0–5.0)
HCT: 40.5 % (ref 39.0–52.0)
Lymphs Abs: 1.7 10*3/uL (ref 0.7–4.0)
MCHC: 32.9 g/dL (ref 30.0–36.0)
MCV: 102.3 fl — ABNORMAL HIGH (ref 78.0–100.0)
Monocytes Absolute: 0.6 10*3/uL (ref 0.1–1.0)
Platelets: 138 10*3/uL — ABNORMAL LOW (ref 150.0–400.0)
RBC: 3.95 Mil/uL — ABNORMAL LOW (ref 4.22–5.81)
WBC: 7.1 10*3/uL (ref 4.5–10.5)

## 2011-06-27 LAB — PHOSPHORUS: Phosphorus: 3.6 mg/dL (ref 2.3–4.6)

## 2011-06-27 LAB — COMPREHENSIVE METABOLIC PANEL
Alkaline Phosphatase: 92 U/L (ref 39–117)
BUN: 48 mg/dL — ABNORMAL HIGH (ref 6–23)
Chloride: 107 mEq/L (ref 96–112)
Creatinine, Ser: 5 mg/dL (ref 0.4–1.5)
GFR: 13.45 mL/min — CL (ref >60.00)
Potassium: 4.7 mEq/L (ref 3.5–5.1)
Total Bilirubin: 0.7 mg/dL (ref 0.3–1.2)

## 2011-06-27 LAB — MAGNESIUM: Magnesium: 1.8 mg/dL (ref 1.5–2.5)

## 2011-06-27 LAB — TB SKIN TEST
Induration: 0
TB Skin Test: NEGATIVE mm

## 2011-06-27 LAB — LIPID PANEL
LDL Cholesterol: 38 mg/dL (ref 0–99)
Total CHOL/HDL Ratio: 2

## 2011-06-27 NOTE — Telephone Encounter (Signed)
Hope with [facility] lab called stating that Creatinine was 5.0. Per Dr Charlett Blake advised to skip Lasix tomorrow and do a renal on Friday early in the day. I spoke with patient on phone and gave Dr Charlett Blake the phone to talk to patient. Pt agreeable to go to hospital if breathing gets worse and/ or call Dr Anitra Lauth with any other concerns. Pt is seeing Nephrologist on Tues the 19th. Pt is also ageeable to limit Lasix intake.

## 2011-06-27 NOTE — Telephone Encounter (Signed)
When patient was on phone he notified us that he has become increasingly short of breath recently and he's actually increased his Lasix use. He is advised that this is likely bumped his creatinine is asked to minimize its use. He agrees. He does also agree to present to the ER if his breathing worsens tonight.

## 2011-06-28 ENCOUNTER — Encounter: Payer: Self-pay | Admitting: Family Medicine

## 2011-07-02 ENCOUNTER — Encounter (HOSPITAL_COMMUNITY)
Admission: RE | Admit: 2011-07-02 | Discharge: 2011-07-02 | Payer: Managed Care, Other (non HMO) | Source: Ambulatory Visit | Attending: Orthopedic Surgery | Admitting: Orthopedic Surgery

## 2011-07-02 ENCOUNTER — Encounter (HOSPITAL_COMMUNITY): Payer: Self-pay

## 2011-07-02 ENCOUNTER — Encounter (HOSPITAL_COMMUNITY): Payer: Self-pay | Admitting: Respiratory Therapy

## 2011-07-02 LAB — DIFFERENTIAL
Basophils Absolute: 0.1 10*3/uL (ref 0.0–0.1)
Basophils Relative: 1 % (ref 0–1)
Eosinophils Relative: 6 % — ABNORMAL HIGH (ref 0–5)
Monocytes Absolute: 0.6 10*3/uL (ref 0.1–1.0)
Neutro Abs: 4.8 10*3/uL (ref 1.7–7.7)

## 2011-07-02 LAB — BASIC METABOLIC PANEL
BUN: 50 mg/dL — ABNORMAL HIGH (ref 6–23)
Calcium: 9.6 mg/dL (ref 8.4–10.5)
Chloride: 106 mEq/L (ref 96–112)
Creatinine, Ser: 4.83 mg/dL — ABNORMAL HIGH (ref 0.50–1.35)
GFR calc Af Amer: 16 mL/min — ABNORMAL LOW (ref >90)

## 2011-07-02 LAB — CBC
HCT: 40.3 % (ref 39.0–52.0)
MCHC: 34.5 g/dL (ref 30.0–36.0)
MCV: 97.6 fL (ref 78.0–100.0)
Platelets: DECREASED 10*3/uL (ref 150–400)
RDW: 15.4 % (ref 11.5–15.5)
WBC: 8 10*3/uL (ref 4.0–10.5)

## 2011-07-02 LAB — URINALYSIS, ROUTINE W REFLEX MICROSCOPIC
Bilirubin Urine: NEGATIVE
Ketones, ur: NEGATIVE mg/dL
Protein, ur: >300 mg/dL — AB
Urobilinogen, UA: 0.2 mg/dL (ref 0.0–1.0)

## 2011-07-02 LAB — SURGICAL PCR SCREEN
MRSA, PCR: NEGATIVE
Staphylococcus aureus: NEGATIVE

## 2011-07-02 LAB — PROTIME-INR: Prothrombin Time: 13.8 seconds (ref 11.6–15.2)

## 2011-07-02 LAB — URINE MICROSCOPIC-ADD ON

## 2011-07-02 LAB — APTT: aPTT: 28 seconds (ref 24–37)

## 2011-07-02 NOTE — Pre-Procedure Instructions (Signed)
[place]  07/02/2011   Your procedure is scheduled on:  07/09/11  Report to [facility] at 530 AM.  Call this number if you have problems the morning of surgery: [phone]   Remember:   Do not eat food:After Midnight.  May have clear liquids: up to 4 Hours before arrival.  Clear liquids include soda, tea, black coffee, apple or grape juice, broth.  Take these medicines the morning of surgery with A SIP OF WATER: zyloprim,nexium,flomax,lopressor,procardia,percocet   Do not wear jewelry, make-up or nail polish.  Do not wear lotions, powders, or perfumes. You may wear deodorant.  Do not shave 48 hours prior to surgery.  Do not bring valuables to the hospital.  Contacts, dentures or bridgework may not be worn into surgery.  Leave suitcase in the car. After surgery it may be brought to your room.  For patients admitted to the hospital, checkout time is 11:00 AM the day of discharge.   Patients discharged the day of surgery will not be allowed to drive home.  Name and phone number of your driver: wife  Special Instructions: CHG Shower Use Special Wash: 1/2 bottle night before surgery and 1/2 bottle morning of surgery.   Please read over the following fact sheets that you were given: Pain Booklet, Coughing and Deep Breathing, Blood Transfusion Information, MRSA Information and Surgical Site Infection Prevention

## 2011-07-02 NOTE — Progress Notes (Signed)
Last stress test in chart.  [place] notified  And form faxed.   To [facility]

## 2011-07-02 NOTE — Progress Notes (Signed)
Will give anb. Labs values to allison zelenak pa to review

## 2011-07-03 ENCOUNTER — Other Ambulatory Visit: Payer: Self-pay | Admitting: *Deleted

## 2011-07-03 NOTE — Telephone Encounter (Signed)
Faxed request for refill.  This med has never been filled by our office.  OK to fill?

## 2011-07-03 NOTE — Consult Note (Addendum)
Anesthesia:  Patient is a 46 year old male scheduled for a right THA on 07/09/11.  History includes lupus, lupus nephritis with recent LUE AVF but is not yet requiring HD, HTN, LV thrombus (resolved by cardiac MRI 11/2010), MI 2001/2012, CAD on beta blocker therapy (RCA/CX stents '01, LAD BMS 07/2010), ischemic CM with EF 30-35% (01/2011), s/p Medtronic AICD (11/2010, [name]), anemia, GERD, AVN hip, former smoker, HH.  His PCP is Dr. Shawnie Dapper.  His Nephrologist is Dr. Corliss Parish.  She was suppose to see him earlier today.  Will request those records.  By her December 2012 note, his Cr now stays in the 4's (4.4).  Preoperative 07/02/11  labs show a BUN/Cr 50/4.83.  His platelets were clumped, but were 138 on 06/27/11.  Coags are WNL.  CXR from 03/28/11 showed stable mild cardiomegaly with pacer. Mild peribronchial thickening.  His Cardiologist is Dr. [name] who felt patient was okay to proceed from a Cardiac standpoint.  He had a normal nuclear stress test on 07/06/10 with an EF 55%, but had an acute anterior MI in March 2012 and had subsequent depressed LV function and required LAD stenting and a LifeVest.  (See Notes tab for full 08/02/10 cath report.)  [name] ultimately placed a BiV AICD on 11/20/10.  Cardiac MRI on 11/14/10 showed: 1) Moderate to severe LVE EF 28%  2) Resolution of mural apical thrombus  3) No gadolinium given due to renal failure  4) Mild LAE  Echo from 02/07/11 showed: - Left ventricle: The cavity size was moderately dilated. Wall thickness was increased in a pattern of mild LVH. The estimated ejection fraction was 35%. Akinesis of the mid-distalanteroseptal and apical myocardium; consistent with infarction. No evidence of thrombus. Acoustic contrast opacification revealed no evidence ofthrombus. - Mitral valve: Mild regurgitation. - Left atrium: The atrium was moderately dilated. - Right atrium: The atrium was mildly dilated. - Atrial septum: No defect or  patent foramen ovale was identified. - Pulmonary arteries: PA peak pressure: 1mm Hg (S).  Will review his most recent Nephrology notes when available, but if they do not indicate any contraindication for this procedure and he is not exhibiting any new CV symptoms, then would plan to proceed.  Addendum:  07/04/11 1440  I have reviewed Dr. Shelva Majestic office note from 07/03/11. She is aware of his planned procedure.  She has notified Roy Dyer that surgery could worsen his kidney function to the point that he would require hemodialysis, but is hoping that it will not.  He does have a BVT (fistula) that is ready for use if needed.  After he recovers from his hip replacement he will be further evaluated for a kidney transplant.

## 2011-07-03 NOTE — Telephone Encounter (Signed)
This is a med that is originally rx'd by his nephrologist and the RF request should be sent to their office since they are in charge of monitoring his dosing of this med.  thx

## 2011-07-04 ENCOUNTER — Encounter: Payer: Self-pay | Admitting: Family Medicine

## 2011-07-05 NOTE — H&P (Signed)
HISTORY OF PRESENT ILLNESS:  Roy Dyer comes in today reporting that his left total hip metal- on-metal is doing well.  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  In the meantime, he had a fairly significant heart attack in April 2012 and he is also approaching renal dialysis.  In any event, he has severe pain in his right hip.  Previous x-rays have shown that hip is essentially destroyed and he is certainly a candidate for hip arthroplasty pending clearance from cardiology and renal medicine, as well as his doctors taking care of his lupus.  Past Medical Hx: Lupus nephritis, MI, 2 cardiac stents, HTN  No known drug allergies.  Family Hx: DM, HTN, CAD  Social Hx: Smokes, denies alcohol.  Review of systems reviewed thoroughly and all other systems are negative as related to the chief complaint.  Patient denies dizziness, nausea, fever, chills, vomiting, shortness of breath, chest pain, loss of appetite, or rash.    PHYSICAL EXAM: Well-developed, well-nourished.  Awake, alert, and oriented x3.  Extraocular motion is intact.  No use of accessory respiratory muscles for breathing.   Cardiovascular exam reveals a regular rhythm.  Skin is intact without cuts, scrapes, or abrasions. The left total hip internal and external rotates 30 degrees each, flexes to 120 degrees and has no discomfort.  His would is well-healed.  The right hip is another story.  He has essentially no internal rotation, very little, if any, external rotation, his foot tap is positive.  He is walking with a couple of crutches now because of the pain, it wakes him up at night and prevents him from doing any gainful activities.  He is an otherwise well-nourished, well-developed 46 year old gentleman in very mild distress secondary to his hip pain, seated on the exam table.  He has good pulses to his feet.    Xrays: End-stage  arthritis with collapse of the femoral head and erosion into the acetabulum  IMPRESSION:  Doing very well after complex left total  hip arthroplasty, end stage arthritis, right total hip with increasing pain.  PLAN:  He is able to get by on tramadol, he would like a refill and we will give him 50 mg p.o. q.6-8h. p.r.n., dispense 120 with one refill.  I will reassess him in the office after a few weeks after he has had a chance to discuss the situation with Dr. Johnsie Cancel his cardiologist and Dr. Moshe Cipro who is his renal medicine doctor.  He is a candidate for hip replacement and has done well with a prior total hip.  Hold Lisinopril post-op.

## 2011-07-08 MED ORDER — CEFAZOLIN SODIUM-DEXTROSE 2-3 GM-% IV SOLR
2.0000 g | INTRAVENOUS | Status: DC
  Filled 2011-07-08: qty 50

## 2011-07-09 ENCOUNTER — Inpatient Hospital Stay (HOSPITAL_COMMUNITY): Payer: Managed Care, Other (non HMO)

## 2011-07-09 ENCOUNTER — Encounter (HOSPITAL_COMMUNITY): Payer: Self-pay | Admitting: Vascular Surgery

## 2011-07-09 ENCOUNTER — Inpatient Hospital Stay (HOSPITAL_COMMUNITY)
Admission: RE | Admit: 2011-07-09 | Discharge: 2011-07-20 | DRG: 469 | Disposition: A | Discharge status: Home Health | Payer: Managed Care, Other (non HMO) | Source: Ambulatory Visit | Attending: Internal Medicine | Admitting: Internal Medicine

## 2011-07-09 ENCOUNTER — Encounter (HOSPITAL_COMMUNITY): Payer: Self-pay | Admitting: *Deleted

## 2011-07-09 ENCOUNTER — Inpatient Hospital Stay (HOSPITAL_COMMUNITY): Payer: Managed Care, Other (non HMO) | Admitting: Vascular Surgery

## 2011-07-09 ENCOUNTER — Encounter (HOSPITAL_COMMUNITY)
Admission: RE | Disposition: A | Discharge status: Home Health | Payer: Self-pay | Source: Ambulatory Visit | Attending: Orthopedic Surgery

## 2011-07-09 DIAGNOSIS — Z8249 Family history of ischemic heart disease and other diseases of the circulatory system: Secondary | ICD-10-CM

## 2011-07-09 DIAGNOSIS — N186 End stage renal disease: Secondary | ICD-10-CM

## 2011-07-09 DIAGNOSIS — D62 Acute posthemorrhagic anemia: Secondary | ICD-10-CM | POA: Diagnosis not present

## 2011-07-09 DIAGNOSIS — Z87891 Personal history of nicotine dependence: Secondary | ICD-10-CM

## 2011-07-09 DIAGNOSIS — K219 Gastro-esophageal reflux disease without esophagitis: Secondary | ICD-10-CM | POA: Diagnosis present

## 2011-07-09 DIAGNOSIS — E782 Mixed hyperlipidemia: Secondary | ICD-10-CM

## 2011-07-09 DIAGNOSIS — I7300 Raynaud's syndrome without gangrene: Secondary | ICD-10-CM

## 2011-07-09 DIAGNOSIS — I498 Other specified cardiac arrhythmias: Secondary | ICD-10-CM | POA: Diagnosis not present

## 2011-07-09 DIAGNOSIS — N179 Acute kidney failure, unspecified: Secondary | ICD-10-CM | POA: Diagnosis present

## 2011-07-09 DIAGNOSIS — Z0000 Encounter for general adult medical examination without abnormal findings: Secondary | ICD-10-CM

## 2011-07-09 DIAGNOSIS — R739 Hyperglycemia, unspecified: Secondary | ICD-10-CM

## 2011-07-09 DIAGNOSIS — Z01810 Encounter for preprocedural cardiovascular examination: Secondary | ICD-10-CM

## 2011-07-09 DIAGNOSIS — I959 Hypotension, unspecified: Secondary | ICD-10-CM | POA: Diagnosis not present

## 2011-07-09 DIAGNOSIS — E875 Hyperkalemia: Secondary | ICD-10-CM | POA: Diagnosis present

## 2011-07-09 DIAGNOSIS — E872 Acidosis, unspecified: Secondary | ICD-10-CM | POA: Diagnosis present

## 2011-07-09 DIAGNOSIS — Z833 Family history of diabetes mellitus: Secondary | ICD-10-CM

## 2011-07-09 DIAGNOSIS — N529 Male erectile dysfunction, unspecified: Secondary | ICD-10-CM

## 2011-07-09 DIAGNOSIS — Z95810 Presence of automatic (implantable) cardiac defibrillator: Secondary | ICD-10-CM

## 2011-07-09 DIAGNOSIS — I120 Hypertensive chronic kidney disease with stage 5 chronic kidney disease or end stage renal disease: Secondary | ICD-10-CM | POA: Diagnosis present

## 2011-07-09 DIAGNOSIS — I252 Old myocardial infarction: Secondary | ICD-10-CM

## 2011-07-09 DIAGNOSIS — I2589 Other forms of chronic ischemic heart disease: Secondary | ICD-10-CM | POA: Diagnosis present

## 2011-07-09 DIAGNOSIS — I255 Ischemic cardiomyopathy: Secondary | ICD-10-CM

## 2011-07-09 DIAGNOSIS — I2510 Atherosclerotic heart disease of native coronary artery without angina pectoris: Secondary | ICD-10-CM

## 2011-07-09 DIAGNOSIS — M87059 Idiopathic aseptic necrosis of unspecified femur: Principal | ICD-10-CM

## 2011-07-09 DIAGNOSIS — D638 Anemia in other chronic diseases classified elsewhere: Secondary | ICD-10-CM | POA: Diagnosis present

## 2011-07-09 DIAGNOSIS — N058 Unspecified nephritic syndrome with other morphologic changes: Secondary | ICD-10-CM | POA: Diagnosis present

## 2011-07-09 DIAGNOSIS — M329 Systemic lupus erythematosus, unspecified: Secondary | ICD-10-CM

## 2011-07-09 DIAGNOSIS — I509 Heart failure, unspecified: Secondary | ICD-10-CM | POA: Diagnosis present

## 2011-07-09 DIAGNOSIS — M109 Gout, unspecified: Secondary | ICD-10-CM | POA: Diagnosis present

## 2011-07-09 DIAGNOSIS — I10 Essential (primary) hypertension: Secondary | ICD-10-CM

## 2011-07-09 DIAGNOSIS — Z7901 Long term (current) use of anticoagulants: Secondary | ICD-10-CM

## 2011-07-09 HISTORY — PX: TOTAL HIP ARTHROPLASTY: SHX124

## 2011-07-09 SURGERY — ARTHROPLASTY, HIP, TOTAL,POSTERIOR APPROACH
Anesthesia: General | Site: Hip | Laterality: Right | Wound class: Clean

## 2011-07-09 MED ORDER — HYDROMORPHONE HCL PF 1 MG/ML IJ SOLN
0.2500 mg | INTRAMUSCULAR | Status: DC | PRN
  Administered 2011-07-09 (×4): 0.5 mg via INTRAVENOUS

## 2011-07-09 MED ORDER — SODIUM CHLORIDE 0.9 % IR SOLN
Status: DC | PRN
  Administered 2011-07-09: 1000 mL

## 2011-07-09 MED ORDER — BISACODYL 10 MG RE SUPP: 10.0000 mg | Freq: Every day | RECTAL | Status: DC | PRN

## 2011-07-09 MED ORDER — ACETAMINOPHEN 650 MG RE SUPP: 650.0000 mg | Freq: Four times a day (QID) | RECTAL | Status: DC | PRN

## 2011-07-09 MED ORDER — METOPROLOL TARTRATE 100 MG PO TABS
100.0000 mg | ORAL_TABLET | Freq: Two times a day (BID) | ORAL | Status: DC
  Administered 2011-07-09 – 2011-07-10 (×2): 100 mg via ORAL
  Filled 2011-07-09 (×6): qty 1

## 2011-07-09 MED ORDER — PATIENT'S GUIDE TO USING COUMADIN BOOK
Freq: Once | Status: AC
  Administered 2011-07-09: 12:00:00
  Filled 2011-07-09: qty 1

## 2011-07-09 MED ORDER — PHENOL 1.4 % MT LIQD
1.0000 | OROMUCOSAL | Status: DC | PRN
  Administered 2011-07-17 (×2): 1 via OROMUCOSAL
  Filled 2011-07-09: qty 177

## 2011-07-09 MED ORDER — METOCLOPRAMIDE HCL 10 MG PO TABS
5.0000 mg | ORAL_TABLET | Freq: Three times a day (TID) | ORAL | Status: DC | PRN
  Filled 2011-07-09: qty 1

## 2011-07-09 MED ORDER — KCL IN DEXTROSE-NACL 20-5-0.45 MEQ/L-%-% IV SOLN
INTRAVENOUS | Status: AC
  Filled 2011-07-09: qty 1000

## 2011-07-09 MED ORDER — DIPHENHYDRAMINE HCL 12.5 MG/5ML PO ELIX
12.5000 mg | ORAL_SOLUTION | ORAL | Status: DC | PRN
  Filled 2011-07-09: qty 10

## 2011-07-09 MED ORDER — ATORVASTATIN CALCIUM 80 MG PO TABS
80.0000 mg | ORAL_TABLET | Freq: Every day | ORAL | Status: DC
  Administered 2011-07-09 – 2011-07-19 (×11): 80 mg via ORAL
  Filled 2011-07-09 (×13): qty 1

## 2011-07-09 MED ORDER — OXYCODONE HCL 5 MG PO TABS
5.0000 mg | ORAL_TABLET | ORAL | Status: DC | PRN
  Administered 2011-07-11: 10 mg via ORAL
  Filled 2011-07-09: qty 2

## 2011-07-09 MED ORDER — MEPERIDINE HCL 25 MG/ML IJ SOLN: 6.2500 mg | INTRAMUSCULAR | Status: DC | PRN

## 2011-07-09 MED ORDER — NIFEDIPINE 10 MG PO CAPS
10.0000 mg | ORAL_CAPSULE | Freq: Three times a day (TID) | ORAL | Status: DC
  Administered 2011-07-09 – 2011-07-16 (×13): 10 mg via ORAL
  Filled 2011-07-09 (×26): qty 1

## 2011-07-09 MED ORDER — FLUTICASONE PROPIONATE HFA 44 MCG/ACT IN AERO
1.0000 | INHALATION_SPRAY | Freq: Two times a day (BID) | RESPIRATORY_TRACT | Status: DC
  Administered 2011-07-10 – 2011-07-20 (×20): 1 via RESPIRATORY_TRACT
  Filled 2011-07-09 (×3): qty 10.6

## 2011-07-09 MED ORDER — ONDANSETRON HCL 4 MG/2ML IJ SOLN
INTRAMUSCULAR | Status: DC | PRN
  Administered 2011-07-09: 4 mg via INTRAVENOUS

## 2011-07-09 MED ORDER — METHOCARBAMOL 100 MG/ML IJ SOLN
500.0000 mg | Freq: Four times a day (QID) | INTRAVENOUS | Status: DC | PRN
  Filled 2011-07-09: qty 5

## 2011-07-09 MED ORDER — FLEET ENEMA 7-19 GM/118ML RE ENEM: 1.0000 | ENEMA | Freq: Once | RECTAL | Status: AC | PRN

## 2011-07-09 MED ORDER — PANTOPRAZOLE SODIUM 40 MG PO TBEC
40.0000 mg | DELAYED_RELEASE_TABLET | Freq: Two times a day (BID) | ORAL | Status: DC
  Administered 2011-07-09: 40 mg via ORAL
  Filled 2011-07-09 (×3): qty 1

## 2011-07-09 MED ORDER — CEFAZOLIN SODIUM 1-5 GM-% IV SOLN
INTRAVENOUS | Status: DC | PRN
  Administered 2011-07-09: 1 g via INTRAVENOUS

## 2011-07-09 MED ORDER — WARFARIN SODIUM 10 MG PO TABS
10.0000 mg | ORAL_TABLET | Freq: Once | ORAL | Status: AC
  Administered 2011-07-09: 10 mg via ORAL
  Filled 2011-07-09: qty 1

## 2011-07-09 MED ORDER — WARFARIN VIDEO: Freq: Once | Status: DC

## 2011-07-09 MED ORDER — GLYCOPYRROLATE 0.2 MG/ML IJ SOLN
INTRAMUSCULAR | Status: DC | PRN
  Administered 2011-07-09: .8 mg via INTRAVENOUS

## 2011-07-09 MED ORDER — EPHEDRINE SULFATE 50 MG/ML IJ SOLN
INTRAMUSCULAR | Status: DC | PRN
  Administered 2011-07-09 (×3): 10 mg via INTRAVENOUS
  Administered 2011-07-09 (×2): 5 mg via INTRAVENOUS

## 2011-07-09 MED ORDER — PROPOFOL 10 MG/ML IV EMUL
INTRAVENOUS | Status: DC | PRN
  Administered 2011-07-09: 130 mg via INTRAVENOUS

## 2011-07-09 MED ORDER — METOCLOPRAMIDE HCL 5 MG/ML IJ SOLN
5.0000 mg | Freq: Three times a day (TID) | INTRAMUSCULAR | Status: DC | PRN
  Administered 2011-07-09: 10 mg via INTRAVENOUS
  Filled 2011-07-09 (×3): qty 2

## 2011-07-09 MED ORDER — ONDANSETRON HCL 4 MG/2ML IJ SOLN
4.0000 mg | Freq: Four times a day (QID) | INTRAMUSCULAR | Status: DC | PRN
  Administered 2011-07-13 – 2011-07-20 (×2): 4 mg via INTRAVENOUS
  Filled 2011-07-09: qty 2

## 2011-07-09 MED ORDER — ONDANSETRON HCL 4 MG PO TABS: 4.0000 mg | ORAL_TABLET | Freq: Four times a day (QID) | ORAL | Status: DC | PRN

## 2011-07-09 MED ORDER — ONDANSETRON HCL 4 MG/2ML IJ SOLN: 4.0000 mg | Freq: Once | INTRAMUSCULAR | Status: DC | PRN

## 2011-07-09 MED ORDER — MIDAZOLAM HCL 5 MG/5ML IJ SOLN
INTRAMUSCULAR | Status: DC | PRN
  Administered 2011-07-09: 2 mg via INTRAVENOUS

## 2011-07-09 MED ORDER — MORPHINE SULFATE 2 MG/ML IJ SOLN: 0.0500 mg/kg | INTRAMUSCULAR | Status: DC | PRN

## 2011-07-09 MED ORDER — CALCITRIOL 0.25 MCG PO CAPS
0.2500 ug | ORAL_CAPSULE | Freq: Every day | ORAL | Status: DC
  Administered 2011-07-09 – 2011-07-18 (×10): 0.25 ug via ORAL
  Filled 2011-07-09 (×10): qty 1

## 2011-07-09 MED ORDER — ALUM & MAG HYDROXIDE-SIMETH 200-200-20 MG/5ML PO SUSP: 30.0000 mL | ORAL | Status: DC | PRN

## 2011-07-09 MED ORDER — BUPIVACAINE-EPINEPHRINE PF 0.25-1:200000 % IJ SOLN
INTRAMUSCULAR | Status: DC | PRN
  Administered 2011-07-09: 10 mL

## 2011-07-09 MED ORDER — ACETAMINOPHEN 325 MG PO TABS: 650.0000 mg | ORAL_TABLET | Freq: Four times a day (QID) | ORAL | Status: DC | PRN

## 2011-07-09 MED ORDER — NITROGLYCERIN 0.4 MG SL SUBL: 0.4000 mg | SUBLINGUAL_TABLET | SUBLINGUAL | Status: DC | PRN

## 2011-07-09 MED ORDER — FUROSEMIDE 40 MG PO TABS
40.0000 mg | ORAL_TABLET | Freq: Every day | ORAL | Status: DC | PRN
  Administered 2011-07-16: 40 mg via ORAL
  Filled 2011-07-09: qty 1

## 2011-07-09 MED ORDER — LACTATED RINGERS IV SOLN
INTRAVENOUS | Status: DC | PRN
  Administered 2011-07-09: 08:00:00 via INTRAVENOUS

## 2011-07-09 MED ORDER — ISOSORBIDE MONONITRATE ER 30 MG PO TB24
30.0000 mg | ORAL_TABLET | Freq: Every day | ORAL | Status: DC
  Administered 2011-07-09 – 2011-07-20 (×8): 30 mg via ORAL
  Filled 2011-07-09 (×12): qty 1

## 2011-07-09 MED ORDER — HYDROMORPHONE HCL PF 1 MG/ML IJ SOLN: 0.5000 mg | INTRAMUSCULAR | Status: DC | PRN

## 2011-07-09 MED ORDER — PHENYLEPHRINE HCL 10 MG/ML IJ SOLN
INTRAMUSCULAR | Status: DC | PRN
  Administered 2011-07-09 (×6): 80 ug via INTRAVENOUS

## 2011-07-09 MED ORDER — PANTOPRAZOLE SODIUM 40 MG PO TBEC
40.0000 mg | DELAYED_RELEASE_TABLET | Freq: Every day | ORAL | Status: DC
  Administered 2011-07-09: 40 mg via ORAL

## 2011-07-09 MED ORDER — METHOCARBAMOL 500 MG PO TABS
500.0000 mg | ORAL_TABLET | Freq: Four times a day (QID) | ORAL | Status: DC | PRN
  Administered 2011-07-10 – 2011-07-17 (×5): 500 mg via ORAL
  Filled 2011-07-09 (×7): qty 1

## 2011-07-09 MED ORDER — NIACIN ER (ANTIHYPERLIPIDEMIC) 500 MG PO TBCR
1000.0000 mg | EXTENDED_RELEASE_TABLET | Freq: Every day | ORAL | Status: DC
  Administered 2011-07-09 – 2011-07-19 (×11): 1000 mg via ORAL
  Filled 2011-07-09 (×12): qty 2

## 2011-07-09 MED ORDER — FENTANYL CITRATE 0.05 MG/ML IJ SOLN
INTRAMUSCULAR | Status: DC | PRN
  Administered 2011-07-09: 150 ug via INTRAVENOUS
  Administered 2011-07-09: 50 ug via INTRAVENOUS

## 2011-07-09 MED ORDER — NEOSTIGMINE METHYLSULFATE 1 MG/ML IJ SOLN
INTRAMUSCULAR | Status: DC | PRN
  Administered 2011-07-09: 5 mg via INTRAVENOUS

## 2011-07-09 MED ORDER — ZOLPIDEM TARTRATE 5 MG PO TABS: 5.0000 mg | ORAL_TABLET | Freq: Every evening | ORAL | Status: DC | PRN

## 2011-07-09 MED ORDER — MAGNESIUM HYDROXIDE 400 MG/5ML PO SUSP: 30.0000 mL | Freq: Every day | ORAL | Status: DC | PRN

## 2011-07-09 MED ORDER — HYDROMORPHONE HCL PF 1 MG/ML IJ SOLN
INTRAMUSCULAR | Status: AC
  Filled 2011-07-09: qty 1

## 2011-07-09 MED ORDER — METHOCARBAMOL 100 MG/ML IJ SOLN
500.0000 mg | Freq: Once | INTRAVENOUS | Status: AC
  Administered 2011-07-09: 500 mg via INTRAVENOUS
  Filled 2011-07-09: qty 5

## 2011-07-09 MED ORDER — HYDROCODONE-ACETAMINOPHEN 5-325 MG PO TABS
1.0000 | ORAL_TABLET | ORAL | Status: DC | PRN
  Administered 2011-07-09 – 2011-07-10 (×7): 2 via ORAL
  Filled 2011-07-09 (×7): qty 2

## 2011-07-09 MED ORDER — MENTHOL 3 MG MT LOZG: 1.0000 | LOZENGE | OROMUCOSAL | Status: DC | PRN

## 2011-07-09 MED ORDER — ALLOPURINOL 300 MG PO TABS
300.0000 mg | ORAL_TABLET | Freq: Every day | ORAL | Status: DC
  Administered 2011-07-10 – 2011-07-11 (×2): 300 mg via ORAL
  Filled 2011-07-09 (×3): qty 1

## 2011-07-09 MED ORDER — CHLORHEXIDINE GLUCONATE 4 % EX LIQD: 60.0000 mL | Freq: Once | CUTANEOUS | Status: DC

## 2011-07-09 MED ORDER — DEXTROSE-NACL 5-0.45 % IV SOLN: INTRAVENOUS | Status: DC

## 2011-07-09 MED ORDER — KCL IN DEXTROSE-NACL 20-5-0.45 MEQ/L-%-% IV SOLN
INTRAVENOUS | Status: DC
  Administered 2011-07-09: 1000 mL via INTRAVENOUS
  Administered 2011-07-09 – 2011-07-10 (×4): via INTRAVENOUS
  Filled 2011-07-09 (×11): qty 1000

## 2011-07-09 MED ORDER — ROCURONIUM BROMIDE 100 MG/10ML IV SOLN
INTRAVENOUS | Status: DC | PRN
  Administered 2011-07-09: 50 mg via INTRAVENOUS

## 2011-07-09 MED ORDER — PANTOPRAZOLE SODIUM 40 MG PO TBEC: 40.0000 mg | DELAYED_RELEASE_TABLET | Freq: Every day | ORAL | Status: DC

## 2011-07-09 SURGICAL SUPPLY — 54 items
BLADE SAW SAG 73X25 THK (BLADE) ×1
BLADE SAW SGTL 18X1.27X75 (BLADE) IMPLANT
BLADE SAW SGTL 73X25 THK (BLADE) ×1 IMPLANT
BLADE SAW SGTL MED 73X18.5 STR (BLADE) IMPLANT
BRUSH FEMORAL CANAL (MISCELLANEOUS) IMPLANT
CLOTH BEACON ORANGE TIMEOUT ST (SAFETY) ×2 IMPLANT
COVER BACK TABLE 24X17X13 BIG (DRAPES) IMPLANT
COVER SURGICAL LIGHT HANDLE (MISCELLANEOUS) ×4 IMPLANT
DRAPE ORTHO SPLIT 77X108 STRL (DRAPES) ×1
DRAPE PROXIMA HALF (DRAPES) ×2 IMPLANT
DRAPE SURG ORHT 6 SPLT 77X108 (DRAPES) ×1 IMPLANT
DRAPE U-SHAPE 47X51 STRL (DRAPES) ×2 IMPLANT
DRILL BIT 7/64X5 (BIT) ×2 IMPLANT
DRSG MEPILEX BORDER 4X12 (GAUZE/BANDAGES/DRESSINGS) ×2 IMPLANT
DRSG MEPILEX BORDER 4X8 (GAUZE/BANDAGES/DRESSINGS) IMPLANT
DURAPREP 26ML APPLICATOR (WOUND CARE) ×2 IMPLANT
ELECT BLADE 4.0 EZ CLEAN MEGAD (MISCELLANEOUS) ×2
ELECT REM PT RETURN 9FT ADLT (ELECTROSURGICAL) ×2
ELECTRODE BLDE 4.0 EZ CLN MEGD (MISCELLANEOUS) ×1 IMPLANT
ELECTRODE REM PT RTRN 9FT ADLT (ELECTROSURGICAL) ×1 IMPLANT
FLOSEAL 10ML (HEMOSTASIS) IMPLANT
GAUZE XEROFORM 1X8 LF (GAUZE/BANDAGES/DRESSINGS) IMPLANT
GLOVE BIO SURGEON STRL SZ7 (GLOVE) ×2 IMPLANT
GLOVE BIO SURGEON STRL SZ7.5 (GLOVE) ×2 IMPLANT
GLOVE BIOGEL PI IND STRL 7.0 (GLOVE) ×1 IMPLANT
GLOVE BIOGEL PI IND STRL 8 (GLOVE) ×1 IMPLANT
GLOVE BIOGEL PI INDICATOR 7.0 (GLOVE) ×1
GLOVE BIOGEL PI INDICATOR 8 (GLOVE) ×1
GOWN PREVENTION PLUS XLARGE (GOWN DISPOSABLE) ×2 IMPLANT
GOWN STRL NON-REIN LRG LVL3 (GOWN DISPOSABLE) ×4 IMPLANT
HANDPIECE INTERPULSE COAX TIP (DISPOSABLE)
HOOD PEEL AWAY FACE SHEILD DIS (HOOD) ×4 IMPLANT
KIT BASIN OR (CUSTOM PROCEDURE TRAY) ×2 IMPLANT
KIT ROOM TURNOVER OR (KITS) ×2 IMPLANT
MANIFOLD NEPTUNE II (INSTRUMENTS) ×2 IMPLANT
NEEDLE 22X1 1/2 (OR ONLY) (NEEDLE) ×2 IMPLANT
NS IRRIG 1000ML POUR BTL (IV SOLUTION) ×2 IMPLANT
PACK TOTAL JOINT (CUSTOM PROCEDURE TRAY) ×2 IMPLANT
PAD ARMBOARD 7.5X6 YLW CONV (MISCELLANEOUS) ×4 IMPLANT
PASSER SUT SWANSON 36MM LOOP (INSTRUMENTS) ×2 IMPLANT
PRESSURIZER FEMORAL UNIV (MISCELLANEOUS) IMPLANT
SET HNDPC FAN SPRY TIP SCT (DISPOSABLE) IMPLANT
SUT ETHIBOND 2 V 37 (SUTURE) ×2 IMPLANT
SUT ETHILON 3 0 FSL (SUTURE) ×2 IMPLANT
SUT VIC AB 0 CTB1 27 (SUTURE) ×2 IMPLANT
SUT VIC AB 1 CTX 36 (SUTURE) ×1
SUT VIC AB 1 CTX36XBRD ANBCTR (SUTURE) ×1 IMPLANT
SUT VIC AB 2-0 CTB1 (SUTURE) ×2 IMPLANT
SYR CONTROL 10ML LL (SYRINGE) ×2 IMPLANT
TOWEL OR 17X24 6PK STRL BLUE (TOWEL DISPOSABLE) ×2 IMPLANT
TOWEL OR 17X26 10 PK STRL BLUE (TOWEL DISPOSABLE) ×2 IMPLANT
TOWER CARTRIDGE SMART MIX (DISPOSABLE) IMPLANT
TRAY FOLEY CATH 14FR (SET/KITS/TRAYS/PACK) IMPLANT
WATER STERILE IRR 1000ML POUR (IV SOLUTION) ×8 IMPLANT

## 2011-07-09 NOTE — Progress Notes (Signed)
Orthopedic Tech Progress Note Patient Details:  Roy Dyer Feb 03, 1966 [id number]  Patient ID: [name], male   DOB: 1966/01/14, 46 y.o.   MRN: [id number]   [name] 07/09/2011, 7:29 PM Trapeze bar

## 2011-07-09 NOTE — Interval H&P Note (Signed)
History and Physical Interval Note:  07/09/2011 7:21 AM  Roy Dyer  has presented today for surgery, with the diagnosis of OSTEOARTHRITIS PERTHES RIGHT HIP  The various methods of treatment have been discussed with the patient and family. After consideration of risks, benefits and other options for treatment, the patient has consented to  Procedure(s) (LRB): TOTAL HIP ARTHROPLASTY (Right) as a surgical intervention .  The patients' history has been reviewed, patient examined, no change in status, stable for surgery.  I have reviewed the patients' chart and labs.  Questions were answered to the patient's satisfaction.     Kerin Salen

## 2011-07-09 NOTE — Transfer of Care (Signed)
Immediate Anesthesia Transfer of Care Note  Patient: Roy Dyer  Procedure(s) Performed: Procedure(s) (LRB): TOTAL HIP ARTHROPLASTY (Right)  Patient Location: PACU  Anesthesia Type: General  Level of Consciousness: awake  Airway & Oxygen Therapy: Patient Spontanous Breathing  Post-op Assessment: Report given to PACU RN  Post vital signs: Reviewed and stable  Complications: No apparent anesthesia complications

## 2011-07-09 NOTE — Op Note (Signed)
OPERATIVE REPORT    DATE OF PROCEDURE:  07/09/2011       PREOPERATIVE DIAGNOSIS:  OSTEOARTHRITIS RIGHT HIP                                                          POSTOPERATIVE DIAGNOSIS:  osteoarthritis right hip                                                           PROCEDURE:  R total hip arthroplasty using a 58 mm DePuy Pinnacle  Cup, [employer], 400 metal liner, a +3 metal head, a [id number] SROM stem, 22DL Cone   SURGEON: Deone Omahoney J    ASSISTANT:   Talbert Forest, PA-C  (present throughout entire procedure and necessary for timely completion of the procedure)   ANESTHESIA: General BLOOD LOSS: 400 FLUID REPLACEMENT: 1800 crystalloid DRAINS: Foley Catheter URINE OUTPUT: [id number] COMPLICATIONS: none    INDICATIONS FOR PROCEDURE: A 46 y.o. year-old With  [place]   for 20+ years, x-rays show bone-on-bone arthritic changes. Despite conservative measures with observation, anti-inflammatory medicine, narcotics, use of a cane, has severe unremitting pain and can ambulate only a few blocks before resting.  Patient desires elective R total hip arthroplasty to decrease pain and increase function. The risks, benefits, and alternatives were discussed at length including but not limited to the risks of infection, bleeding, nerve injury, stiffness, blood clots, the need for revision surgery, cardiopulmonary complications, among others, and they were willing to proceed.y have been discussed. Questions answered.     PROCEDURE IN DETAIL: The patient was identified by armband,  received preoperative IV antibiotics in the holding area at [facility], taken to the operating room , appropriate anesthetic monitors  were attached and general endotracheal anesthesia induced. Foley catheter was inserted. He was rolled into the L lateral decubitus position and fixed there with a Stulberg Mark II pelvic clamp and the R lower extremity was then prepped and draped  in  the usual sterile fashion from the ankle to the hemipelvis. A time-out  procedure was performed. The skin along the lateral hip and thigh  infiltrated with 10 mL of 0.5% Marcaine and epinephrine solution. We  then made a posterolateral approach to the hip. With a #10 blade, 15 cm  incision through skin and subcutaneous tissue down to the level of the  IT band. Small bleeders were identified and cauterized. IT band cut in  line with skin incision exposing the greater trochanter. A Cobra retractor was placed between the gluteus minimus and the superior hip joint capsule, and a spiked Cobra between the quadratus femoris and the inferior hip joint capsule. This isolated the short  external rotators and piriformis tendons. These were tagged with a #2 Ethibond  suture and cut off their insertion on the intertrochanteric crest. The posterior  capsule was then developed into an acetabular-based flap from Posterior Superior off of the acetabulum out over the femoral neck and back posterior inferior to the acetabular rim. This flap was tagged with two #2 Ethibond sutures and retracted protecting the sciatic nerve. This exposed the arthritic  femoral head and osteophytes. The hip was then flexed and internally rotated, dislocating the femoral head and a standard neck cut performed 1 fingerbreadth above the lesser trochanter.  A spiked Cobra was placed in the cotyloid notch and a Hohmann retractor was then used to lever the femur anteriorly off of the anterior pelvic column. A posterior-inferior wing retractor was placed at the junction of the acetabulum and the ischium completing the acetabular exposure.We then removed the peripheral osteophytes and labrum from the acetabulum. We then reamed the acetabulum up to 57 mm with basket reamers obtaining good coverage in all quadrants, irrigated out with normal  saline solution and hammered into place a 58 mm pinnacle cup in 45 degrees of abduction and about 25 degrees of  anteversion. More  peripheral osteophytes removed an apex hole eliminator and a 40 mm metal liner hammered into place. The hip was then flexed and internally rotated exposing the proximal femur, which was entered with the initiating reamer followed by the axial reamers up to a 17.5 mm full depth and 67mm partial depth. We then conically reamed to 22D to the correct depth for a 36 base neck. The calcar was milled to 22DL. A trial cone and stem was inserted in the 25 degrees anteversion, with a +3 40 mm trial head. Trial reduction was then performed and excellent stability was noted with at 90 of flexion with 75 of internal rotation and then full extension with maximal external rotation. The hip could not be dislocated in full extension. The knee could easily flex to about 130 degrees. We also stretched the abductors at this point,  because of the preexisting adductor contractures. All trial components were then removed. The acetabulum was irrigated out with normal saline  solution. On the femoral side a 22DL ZTT1 cone was hammered into place, followed by a [id number] SROM stem in 25 degrees of anteversion. At this point, a +3 28mm metalc head was hammered on the stem. The hip was reduced. We checked our stability  one more time and found to be excellent. The wound was once again  thoroughly irrigated out with normal saline solution pulse lavage. The  capsular flap and short external rotators were repaired back to the  intertrochanteric crest through drill holes with a #2 Ethibond suture.  The IT band was closed with running 1 Vicryl suture. The subcutaneous  tissue with 0 and 2-0 undyed Vicryl suture and the skin with running  interlocking 3-0 nylon suture. Dressing of Xeroform and Mepilex was  then applied. The patient was then unclamped, rolled supine, awaken extubated and taken to recovery room without difficulty in stable condition.   Jordon Kristiansen J 07/09/2011, 8:57 AM

## 2011-07-09 NOTE — Preoperative (Signed)
Beta Blockers   Reason not to administer Beta Blockers:took metoprolol this morning

## 2011-07-09 NOTE — Anesthesia Preprocedure Evaluation (Addendum)
Anesthesia Evaluation  Patient identified by MRN, date of birth, ID band Patient awake    Reviewed: Allergy & Precautions, H&P , NPO status , Patient's Chart, lab work & pertinent test results, reviewed documented beta blocker date and time   Airway Mallampati: II TM Distance: >3 FB Neck ROM: Full    Dental  (+) Teeth Intact and Dental Advisory Given   Pulmonary former smoker         Cardiovascular hypertension, Pt. on home beta blockers + CAD and + Past MI + Cardiac Defibrillator (Aicd Medtronic in place. Dr [name] feels no need to turn off or reinterogate post op)     Neuro/Psych    GI/Hepatic hiatal hernia, GERD-  Controlled,  Endo/Other    Renal/GU CRFRenal disease     Musculoskeletal   Abdominal   Peds  Hematology   Anesthesia Other Findings   Reproductive/Obstetrics                          Anesthesia Physical Anesthesia Plan  ASA: III  Anesthesia Plan: General   Post-op Pain Management:    Induction: Intravenous  Airway Management Planned: Oral ETT  Additional Equipment:   Intra-op Plan:   Post-operative Plan: Extubation in OR  Informed Consent: I have reviewed the patients History and Physical, chart, labs and discussed the procedure including the risks, benefits and alternatives for the proposed anesthesia with the patient or authorized representative who has indicated his/her understanding and acceptance.     Plan Discussed with: CRNA and Surgeon  Anesthesia Plan Comments:         Anesthesia Quick Evaluation

## 2011-07-09 NOTE — Anesthesia Postprocedure Evaluation (Signed)
  Anesthesia Post-op Note  Patient: Roy Dyer  Procedure(s) Performed: Procedure(s) (LRB): TOTAL HIP ARTHROPLASTY (Right)  Patient Location: PACU  Anesthesia Type: General  Level of Consciousness: awake, alert , oriented and patient cooperative  Airway and Oxygen Therapy: Patient Spontanous Breathing and Patient connected to nasal cannula oxygen  Post-op Pain: mild  Post-op Assessment: Post-op Vital signs reviewed, Patient's Cardiovascular Status Stable, Respiratory Function Stable, Patent Airway, No signs of Nausea or vomiting and Pain level controlled  Post-op Vital Signs: stable  Complications: No apparent anesthesia complications

## 2011-07-09 NOTE — Progress Notes (Signed)
ANTICOAGULATION CONSULT NOTE - Initial Consult  Pharmacy Consult for coumadin Indication: VTE prophylaxis  No Known Allergies  Patient Measurements:   Heparin Dosing Weight:202 lb  Vital Signs: Temp: 97.4 F (36.3 C) (02/25 1100) Temp src: Oral (02/25 0605) BP: 106/70 mmHg (02/25 1100) Pulse Rate: 74  (02/25 1100)  Labs: No results found for this basename: HGB:2,HCT:3,PLT:3,APTT:3,LABPROT:3,INR:3,HEPARINUNFRC:3,CREATININE:3,CKTOTAL:3,CKMB:3,TROPONINI:3 in the last 72 hours The CrCl is unknown because both a height and weight (above a minimum accepted value) are required for this calculation.  Medical History: Past Medical History  Diagnosis Date  . Lupus   . CAD (coronary artery disease)      stents RCA/Circ 2001, BMS to LAD 07/2010  . CRF (chronic renal failure)       Baseline Cr 4.5 as of 04/2011 (GFR<20), Dr. Clover Mealy:  he is being prepped for dialysis and referred to [facility] for transplant consideration.  . Cardiomyopathy     Ischemic:  02/2011 EF 30-35%.   Single chamber ICD 11/20/10 ([name])  . Gout   . 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 Left ventricular thrombus 2012    Re-eval 02/2011 showed thrombus RESOLVED, so coumadin was d/c'd (was on it for 80mo)  . implantable cardiac defibrillator single chamber     Medtronic  . Hypertension   . Anemia   . Heart attack 2001 and 2012    3 stents and defib placed in July  . GERD (gastroesophageal reflux disease)   . Hiatal hernia     Medications:  Prescriptions prior to admission  Medication Sig Dispense Refill  . allopurinol (ZYLOPRIM) 300 MG tablet Take 300 mg by mouth daily.      [name] aspirin 81 MG tablet Take 1 tablet (81 mg total) by mouth daily.      [name] atorvastatin (LIPITOR) 80 MG tablet Take 80 mg by mouth daily.      . calcitRIOL (ROCALTROL) 0.25 MCG capsule Take 0.25 mcg by mouth daily.      . clopidogrel (PLAVIX) 75 MG tablet Take 75 mg by mouth daily.        [name] esomeprazole (NEXIUM) 40 MG capsule Take 40 mg by mouth daily before breakfast.        . fluticasone (FLOVENT HFA) 44 MCG/ACT inhaler Inhale 1 puff into the lungs 2 (two) times daily.      . furosemide (LASIX) 40 MG tablet Take 40 mg by mouth daily as needed. For fluid.      . isosorbide mononitrate (IMDUR) 30 MG 24 hr tablet Take 30 mg by mouth daily.      . metoprolol (LOPRESSOR) 100 MG tablet Take 100 mg by mouth 2 (two) times daily.      . niacin (NIASPAN) 500 MG CR tablet Take 1,000 mg by mouth at bedtime.       [name] NIFEdipine (PROCARDIA) 10 MG capsule Take 10 mg by mouth 3 (three) times daily.      . traMADol (ULTRAM) 50 MG tablet Take 1 tablet by mouth Every 6 hours as needed. For pain      . dexlansoprazole (DEXILANT) 60 MG capsule Take 60 mg by mouth daily.      . nitroGLYCERIN (NITROSTAT) 0.4 MG SL tablet Place 0.4 mg under the tongue every 5 (five) minutes as needed. For chest pains      . oxyCODONE-acetaminophen (TYLOX) 5-500 MG per capsule Take 1 tablet by mouth Every 6 hours as needed.  For pain        Assessment: 46 yo male s/p hip arthroplasty for avascular necrossis to start Coumadin for DVT proph  Coumadin score >=8 Goal of Therapy:  INR 2-3   Plan:  Coumadin 10 mg today Daily INR/PT  Tonette Bihari 07/09/2011,11:34 AM

## 2011-07-10 ENCOUNTER — Encounter (HOSPITAL_COMMUNITY): Payer: Self-pay | Admitting: Orthopedic Surgery

## 2011-07-10 LAB — BASIC METABOLIC PANEL
Chloride: 103 mEq/L (ref 96–112)
GFR calc Af Amer: 12 mL/min — ABNORMAL LOW (ref >90)
GFR calc non Af Amer: 10 mL/min — ABNORMAL LOW (ref >90)
Potassium: 5 mEq/L (ref 3.5–5.1)
Sodium: 132 mEq/L — ABNORMAL LOW (ref 135–145)

## 2011-07-10 LAB — CBC
HCT: 27.2 % — ABNORMAL LOW (ref 39.0–52.0)
Hemoglobin: 9.3 g/dL — ABNORMAL LOW (ref 13.0–17.0)
MCHC: 34.2 g/dL (ref 30.0–36.0)
RDW: 14.7 % (ref 11.5–15.5)
WBC: 8.8 10*3/uL (ref 4.0–10.5)

## 2011-07-10 LAB — PROTIME-INR
INR: 1.36 (ref 0.00–1.49)
Prothrombin Time: 17 seconds — ABNORMAL HIGH (ref 11.6–15.2)

## 2011-07-10 MED ORDER — SODIUM CHLORIDE 0.9 % IV BOLUS (SEPSIS)
500.0000 mL | Freq: Once | INTRAVENOUS | Status: AC
  Administered 2011-07-10: 500 mL via INTRAVENOUS

## 2011-07-10 MED ORDER — WARFARIN SODIUM 5 MG PO TABS
5.0000 mg | ORAL_TABLET | Freq: Once | ORAL | Status: AC
  Administered 2011-07-10: 5 mg via ORAL
  Filled 2011-07-10: qty 1

## 2011-07-10 MED ORDER — ESOMEPRAZOLE MAGNESIUM 40 MG PO CPDR
40.0000 mg | DELAYED_RELEASE_CAPSULE | Freq: Every day | ORAL | Status: DC
  Administered 2011-07-11 – 2011-07-20 (×10): 40 mg via ORAL
  Filled 2011-07-10 (×10): qty 1

## 2011-07-10 NOTE — Progress Notes (Signed)
ANTICOAGULATION CONSULT NOTE   Pharmacy Consult for coumadin Indication: VTE prophylaxis  No Known Allergies  Patient Measurements:   Heparin Dosing Weight:202 lb  Vital Signs: Temp: 97.8 F (36.6 C) (02/26 0815) BP: 96/54 mmHg (02/26 0815) Pulse Rate: 68  (02/26 0815)  Labs:  Basename 07/10/11 0528  HGB 9.3*  HCT 27.2*  PLT 102*  APTT --  LABPROT 17.0*  INR 1.36  HEPARINUNFRC --  CREATININE 5.91*  CKTOTAL --  CKMB --  TROPONINI --   The CrCl is unknown because both a height and weight (above a minimum accepted value) are required for this calculation.  Medical History: Past Medical History  Diagnosis Date  . Lupus   . CAD (coronary artery disease)      stents RCA/Circ 2001, BMS to LAD 07/2010  . CRF (chronic renal failure)       Baseline Cr 4.5 as of 04/2011 (GFR<20), Dr. Clover Mealy:  he is being prepped for dialysis and referred to [facility] for transplant consideration.  . Cardiomyopathy     Ischemic:  02/2011 EF 30-35%.   Single chamber ICD 11/20/10 ([name])  . Gout   . 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 Left ventricular thrombus 2012    Re-eval 02/2011 showed thrombus RESOLVED, so coumadin was d/c'd (was on it for 68mo)  . implantable cardiac defibrillator single chamber     Medtronic  . Hypertension   . Anemia   . Heart attack 2001 and 2012    3 stents and defib placed in July  . GERD (gastroesophageal reflux disease)   . Hiatal hernia     Medications:  Prescriptions prior to admission  Medication Sig Dispense Refill  . allopurinol (ZYLOPRIM) 300 MG tablet Take 300 mg by mouth daily.      [name] aspirin 81 MG tablet Take 1 tablet (81 mg total) by mouth daily.      [name] atorvastatin (LIPITOR) 80 MG tablet Take 80 mg by mouth daily.      . calcitRIOL (ROCALTROL) 0.25 MCG capsule Take 0.25 mcg by mouth daily.      . clopidogrel (PLAVIX) 75 MG tablet Take 75 mg by mouth daily.      [name] esomeprazole (NEXIUM) 40  MG capsule Take 40 mg by mouth daily before breakfast.        . fluticasone (FLOVENT HFA) 44 MCG/ACT inhaler Inhale 1 puff into the lungs 2 (two) times daily.      . furosemide (LASIX) 40 MG tablet Take 40 mg by mouth daily as needed. For fluid.      . isosorbide mononitrate (IMDUR) 30 MG 24 hr tablet Take 30 mg by mouth daily.      . metoprolol (LOPRESSOR) 100 MG tablet Take 100 mg by mouth 2 (two) times daily.      . niacin (NIASPAN) 500 MG CR tablet Take 1,000 mg by mouth at bedtime.       [name] NIFEdipine (PROCARDIA) 10 MG capsule Take 10 mg by mouth 3 (three) times daily.      . traMADol (ULTRAM) 50 MG tablet Take 1 tablet by mouth Every 6 hours as needed. For pain      . dexlansoprazole (DEXILANT) 60 MG capsule Take 60 mg by mouth daily.      . nitroGLYCERIN (NITROSTAT) 0.4 MG SL tablet Place 0.4 mg under the tongue every 5 (five) minutes as needed. For chest pains      .  oxyCODONE-acetaminophen (TYLOX) 5-500 MG per capsule Take 1 tablet by mouth Every 6 hours as needed. For pain        Assessment: 46 yo male s/p hip arthroplasty on coumadin for DVT px  Coumadin score >=8 Goal of Therapy:  INR 1.5-2   Plan:  Coumadin 5 mg today Daily INR/PT  Syanne Looney [place] 07/10/2011,9:36 AM

## 2011-07-10 NOTE — Progress Notes (Signed)
Physical Therapy Evaluation  Past Medical History  Diagnosis Date  . Lupus   . CAD (coronary artery disease)      stents RCA/Circ 2001, BMS to LAD 07/2010  . CRF (chronic renal failure)       Baseline Cr 4.5 as of 04/2011 (GFR<20), Dr. Clover Mealy:  he is being prepped for dialysis and referred to [facility] for transplant consideration.  . Cardiomyopathy     Ischemic:  02/2011 EF 30-35%.   Single chamber ICD 11/20/10 ([name])  . Gout   . 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 Left ventricular thrombus 2012    Re-eval 02/2011 showed thrombus RESOLVED, so coumadin was d/c'd (was on it for 10mo)  . implantable cardiac defibrillator single chamber     Medtronic  . Hypertension   . Anemia   . Heart attack 2001 and 2012    3 stents and defib placed in July  . GERD (gastroesophageal reflux disease)   . Hiatal hernia    Past Surgical History  Procedure Date  . Stents     05-2010 and 2- in 2010  . Partial hip arthroplasty     left  . Renal biopsy   . Tympanostomy tube placement 46 yrs old  . Joint replacement   . Av fistula placement 03/28/2011    Procedure: ARTERIOVENOUS (AV) FISTULA CREATION;  Surgeon: Rosetta Posner, MD;  Location: [place];  Service: Vascular;  Laterality: Left;  Creation of Left radiocephallic cimino fistula  . Cardiac catheterization     08/2010  . US echocardiography 12/2010  . Cardiovascular stress test 07/2010  . Av fistula placement 04/27/2011    Procedure: ARTERIOVENOUS (AV) FISTULA CREATION;  Surgeon: Rosetta Posner, MD;  Location: [place];  Service: Vascular;  Laterality: Left;  left basilic vein transposition  . Insert / replace / remove pacemaker     medtronic        dr Pablo Lawrence   icd only   Clinical Impression Statement 46 yo male s/p RTHA presents to PT with decr functional mobility and impairments listed below; will benefit from PT to maximize independence and safety with mobility, and facilitate dc home    07/10/11 0900  PT Visit Information  Last PT Received On 07/10/11  Patient Stated Goals  Goal #1 decr pain  Precautions  Precautions Posterior Hip  Restrictions  Weight Bearing Restrictions Yes  Home Living  Lives With Spouse  Receives Help From Family  Type of [place] Two level;Able to live on main level with bedroom/bathroom  Home Access Stairs to enter  Entrance Stairs-Rails Right  Entrance Stairs-Number of Steps 3  Bathroom Shower/Tub Walk-in shower  Home Adaptive Equipment Bedside commode/3-in-1;Walker - rolling  Prior Function  Level of Independence Independent with basic ADLs  Vocation Full time employment  Cognition  Arousal/Alertness Awake/alert  Overall Cognitive Status Appears within functional limits for tasks assessed  Orientation Level Oriented X4  Sensation  Light Touch Appears Intact  Coordination  Gross Motor Movements are Fluid and Coordinated Yes  Fine Motor Movements are Fluid and Coordinated Yes  Bed Mobility  Bed Mobility Yes  Supine to Sit 4: Min assist  Supine to Sit Details (indicate cue type and reason) Cues for technique, and Post hip Prec  Transfers  Transfers Yes  Sit to Stand 4: Min assist  Sit to Stand Details (indicate cue type and reason)  cues for technique, hip prec  Stand to Sit 4: Min assist  Stand to Sit Details cues for hip prec, hand placement, and control of descent  Ambulation/Gait  Ambulation/Gait Yes  Ambulation/Gait Assistance 4: Min assist  Ambulation/Gait Assistance Details (indicate cue type and reason) cues for posture, sequence, Post hip prec with turns; pt limited by decr activity tol  Ambulation Distance (Feet) 8 Feet  Assistive device Rolling walker  Gait Pattern Step-to pattern  RUE Assessment  RUE Assessment WFL  LUE Assessment  LUE Assessment WFL  RLE Assessment  RLE Assessment X  RLE Strength  RLE Overall Strength Comments Grossly decr AROM and strength, limited by pain postop  LLE  Assessment  LLE Assessment WFL  Cervical Assessment  Cervical Assessment [facility]  Thoracic Assessment  Thoracic Assessment WFL  Lumbar Assessment  Lumbar Assessment [facility]  Exercises  Exercises Total Joint  Total Joint Exercises  Ankle Circles/Pumps AROM;Both;5 reps;Supine  Quad Sets AROM;Right;5 reps;Supine  Gluteal Sets AROM;Both;5 reps;Supine  Towel Squeeze AROM;Right;5 reps;Supine  Heel Slides AAROM;Right;5 reps;10 reps  Hip ABduction/ADduction AAROM;Right;5 reps;Supine  PT - End of Session  Equipment Utilized During Treatment Gait belt  Activity Tolerance Patient limited by fatigue (slightly SOB; O2 sats 97% on room air)  Patient left in chair;with call bell in reach;with family/visitor present  General  Behavior During Session [facility] for tasks performed  Cognition [facility] for tasks performed  PT Assessment  Clinical Impression Statement 46 yo male s/p RTHA presents to PT with decr functional mobility and impairments listed below; will benefit from PT to maximize independence and safety with mobility, and facilitate dc home  PT Recommendation/Assessment Patient will need skilled PT in the acute care venue  PT Problem List Decreased strength;Decreased range of motion;Decreased activity tolerance;Decreased balance;Decreased mobility;Decreased knowledge of use of DME;Decreased knowledge of precautions;Pain  PT Therapy Diagnosis  Difficulty walking;Acute pain;Abnormality of gait  PT Plan  PT Frequency 7X/week  PT Treatment/Interventions DME instruction;Gait training;Stair training;Functional mobility training;Therapeutic activities;Therapeutic exercise;Patient/family education  PT Recommendation  Follow Up Recommendations Home health PT;Supervision/Assistance - 24 hour  Equipment Recommended None recommended by PT  Individuals Consulted  Consulted and Agree with Results and Recommendations Patient  Acute Rehab PT Goals  PT Goal Formulation With patient  Time For Goal Achievement 7 days    Pt will go Supine/Side to Sit with supervision  Pt will go Sit to Supine/Side Independently;with supervision  PT Goal: Sit to Supine/Side - Progress Goal set today  Pt will go Sit to Stand with supervision  PT Goal: Sit to Stand - Progress Goal set today  Pt will go Stand to Sit with supervision  PT Goal: Stand to Sit - Progress Goal set today  Pt will Ambulate >150 feet;with supervision;with rolling walker  PT Goal: Ambulate - Progress Goal set today  Pt will Go Up / Down Stairs 3-5 stairs;with rail(s);with min assist  PT Goal: Up/Down Stairs - Progress Goal set today  Pt will Perform Home Exercise Program with supervision, verbal cues required/provided  PT Goal: Perform Home Exercise Program - Progress Goal set today  Additional Goals  Additional Goal #1 Pt will verbalize and adhere to posterior Hip Prec with mobility acts  PT Goal: Additional Goal #1 - Progress Goal set today    Roney [place], [place]

## 2011-07-10 NOTE — Progress Notes (Signed)
Occupational Therapy Evaluation Patient Details Name: Roy Dyer MRN: [id number] DOB: Sep 19, 1965 Today's Date: 07/10/2011  Problem List:  Patient Active Problem List  Diagnoses  . MIXED HYPERLIPIDEMIA  . ESSENTIAL HYPERTENSION, BENIGN  . ischemic cardiomyopathy status post anterolateral MI  . LUPUS  . Health maintenance examination  . Erectile dysfunction  . Encounter for long-term (current) use of anticoagulants  . implantable cardiac defibrillator -Medtronic-single-chamber  . End stage renal disease  . Hyperglycemia  . Avascular necrosis of femoral head Right  . Raynaud's disease    Past Medical History:  Past Medical History  Diagnosis Date  . Lupus   . CAD (coronary artery disease)      stents RCA/Circ 2001, BMS to LAD 07/2010  . CRF (chronic renal failure)       Baseline Cr 4.5 as of 04/2011 (GFR<20), Dr. Clover Mealy:  he is being prepped for dialysis and referred to [facility] for transplant consideration.  . Cardiomyopathy     Ischemic:  02/2011 EF 30-35%.   Single chamber ICD 11/20/10 ([name])  . Gout   . 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 Left ventricular thrombus 2012    Re-eval 02/2011 showed thrombus RESOLVED, so coumadin was d/c'd (was on it for 59mo)  . implantable cardiac defibrillator single chamber     Medtronic  . Hypertension   . Anemia   . Heart attack 2001 and 2012    3 stents and defib placed in July  . GERD (gastroesophageal reflux disease)   . Hiatal hernia    Past Surgical History:  Past Surgical History  Procedure Date  . Stents     05-2010 and 2- in 2010  . Partial hip arthroplasty     left  . Renal biopsy   . Tympanostomy tube placement 46 yrs old  . Joint replacement   . Av fistula placement 03/28/2011    Procedure: ARTERIOVENOUS (AV) FISTULA CREATION;  Surgeon: Rosetta Posner, MD;  Location: [place];  Service: Vascular;  Laterality: Left;  Creation of Left radiocephallic cimino fistula  .  Cardiac catheterization     08/2010  . US echocardiography 12/2010  . Cardiovascular stress test 07/2010  . Av fistula placement 04/27/2011    Procedure: ARTERIOVENOUS (AV) FISTULA CREATION;  Surgeon: Rosetta Posner, MD;  Location: [place];  Service: Vascular;  Laterality: Left;  left basilic vein transposition  . Insert / replace / remove pacemaker     medtronic        dr Juleen China    [place]   icd only    OT Assessment/Plan/Recommendation OT Assessment Clinical Impression Statement: 46 yo s/p R THA. All education completed. Wife will assist as needed. Pt expressed concerns over fatigue and feeling SOB, however, O2 sats remained in high 90s with 2L. Pt states that O2 appears to help and was questioning if he should begin to use O2 at home due to his cardiac history. Will leave MD a note. OT Recommendation/Assessment: Patient does not need any further OT services OT Recommendation Follow Up Recommendations: No OT follow up Equipment Recommended: None recommended by OT OT Goals Acute Rehab OT Goals OT Goal Formulation:  (eval only)  OT Evaluation Precautions/Restrictions  Precautions Precautions: Posterior Hip Restrictions Weight Bearing Restrictions: Yes Prior Functioning Home Living Lives With: Spouse Receives Help From: Family Type of Home: House Home Layout: Two level;Able to live on main level with bedroom/bathroom Home Access: Stairs  to enter Entrance Stairs-Rails: Right Entrance Stairs-Number of Steps: 3 Bathroom Shower/Tub: Multimedia programmer: Handicapped height Bathroom Accessibility: Yes How Accessible: Accessible via walker Home Adaptive Equipment: Bedside commode/3-in-1;Walker - rolling Additional Comments: wife helped  with sockx/shoes Prior Function Level of Independence: Independent with basic ADLs;Needs assistance with gait Able to Take Stairs?: Yes Driving: Yes Vocation: Full time employment Vocation Requirements: computer ADL ADL Eating/Feeding:  Simulated;Independent Where Assessed - Eating/Feeding: Chair Grooming: Performed;Independent Where Assessed - Grooming: Sitting, chair Upper Body Bathing: Simulated;Set up Where Assessed - Upper Body Bathing: Sitting, chair Lower Body Bathing: Simulated;Minimal assistance Where Assessed - Lower Body Bathing: Sit to stand from chair Upper Body Dressing: Simulated;Set up Where Assessed - Upper Body Dressing: Sitting, chair Lower Body Dressing: Simulated;Moderate assistance Where Assessed - Lower Body Dressing: Sit to stand from chair Toilet Transfer: Simulated;Minimal assistance Toilet Transfer Method: Stand pivot Toilet Transfer Equipment: Bedside commode Equipment Used: Long-handled shoe horn;Long-handled sponge;Reacher;Rolling walker;Sock aid Ambulation Related to ADLs: supervision ADL Comments: Pt requires assit for LB ADL but states that his wife will help him out as needed. Pt has all AE and is knowlegeable of precautions.  Vision/Perception  Vision - History Baseline Vision: No visual deficits Cognition Cognition Arousal/Alertness: Awake/alert Overall Cognitive Status: Appears within functional limits for tasks assessed Orientation Level: Oriented X4 Sensation/Coordination Sensation Light Touch: Appears Intact Coordination Gross Motor Movements are Fluid and Coordinated: Yes Fine Motor Movements are Fluid and Coordinated: Yes Extremity Assessment RUE Assessment RUE Assessment: Within Functional Limits LUE Assessment LUE Assessment: Within Functional Limits Mobility  Bed Mobility Bed Mobility: No Supine to Sit: 4: Min assist Supine to Sit Details (indicate cue type and reason): Cues for technique, and Post hip Prec Transfers Transfers: Yes Sit to Stand: 5: Supervision Sit to Stand Details (indicate cue type and reason): cues for technique, hip prec Stand to Sit: 5: Supervision Stand to Sit Details: cues for hip prec, hand placement, and control of  descent Exercises   End of Session OT - End of Session Equipment Utilized During Treatment: Gait belt Activity Tolerance: Patient limited by fatigue;Patient limited by pain (chronic fatigue issues) Patient left: with call bell in reach;in chair;with family/visitor present Nurse Communication: Other (comment) (fatigue) General Behavior During Session: [facility] for tasks performed Cognition: [facility] for tasks performed   [facility] 07/10/2011, 12:13 PM  [facility], OTR/L  [phone] 07/10/2011

## 2011-07-10 NOTE — Progress Notes (Signed)
Physical Therapy Progress Note   07/10/11 1700  PT Visit Information  Last PT Received On 07/10/11  Precautions  Precautions Posterior Hip  Precaution Booklet Issued Yes (comment)  Precaution Comments Pt able to recall 3/3 Post Hip Prec  Restrictions  Weight Bearing Restrictions Yes  RLE Weight Bearing WBAT  Bed Mobility  Sit to Supine 4: Min assist  Sit to Supine - Details (indicate cue type and reason) Min assist for RLE, cues for posterior prec  Transfers  Sit to Stand 5: Supervision  Sit to Stand Details (indicate cue type and reason) cues for technique, hip prec  Stand to Sit 4: Min assist  Stand to Sit Details cues for RLE positioning for hip prec  Ambulation/Gait  Ambulation/Gait Assistance 4: Min assist (guard assist)  Ambulation/Gait Assistance Details (indicate cue type and reason) good adherence to Post Hip Prec; overall smooth gait, but distance limited by SOB  Ambulation Distance (Feet) 25 Feet  Assistive device Rolling walker  Gait Pattern Step-to pattern  PT - End of Session  Equipment Utilized During Treatment Gait belt  Activity Tolerance Patient limited by fatigue  Patient left in bed;with call bell in reach  General  Behavior During Session [facility] for tasks performed  Cognition [facility] for tasks performed  PT - Assessment/Plan  Comments on Treatment Session Good progress with amb, still limited by SOB  PT Plan Discharge plan remains appropriate  PT Frequency 7X/week  Recommendations for Other Services OT consult  Follow Up Recommendations Home health PT;Supervision/Assistance - 24 hour  Equipment Recommended None recommended by PT  Acute Rehab PT Goals  Time For Goal Achievement 7 days  Pt will go Supine/Side to Sit with supervision  PT Goal: Supine/Side to Sit - Progress Other (comment)  Pt will go Sit to Supine/Side with supervision  PT Goal: Sit to Supine/Side - Progress Progressing toward goal  Pt will go Sit to Stand with supervision  PT Goal: Sit to  Stand - Progress Goal set today  Pt will go Stand to Sit with supervision  PT Goal: Stand to Sit - Progress Goal set today  Pt will Ambulate >150 feet;with supervision;with rolling walker  PT Goal: Ambulate - Progress Goal set today  Pt will Go Up / Down Stairs 3-5 stairs;with rail(s);with min assist  PT Goal: Up/Down Stairs - Progress Other (comment)  Pt will Perform Home Exercise Program with supervision, verbal cues required/provided  PT Goal: Perform Home Exercise Program - Progress Other (comment)  Additional Goals  Additional Goal #1 Pt will verbalize and adhere to posterior Hip Prec with mobility acts  PT Goal: Additional Goal #1 - Progress Progressing toward goal    [place], [address]

## 2011-07-10 NOTE — Progress Notes (Signed)
CARE MANAGEMENT NOTE 07/10/2011         Action/Plan:   Discharge planning. Spoke with patient. Choice offered. Has used [facility] in the pasty, will do so now. Has rolling walker and 3in1. Has family support at discharge.   Anticipated DC Date:  07/12/2011   Anticipated DC Plan:  [place]  In-house referral  NA      DC Planning Services  CM consult      [facility] Choice  HOME HEALTH   Choice offered to / List presented to:  C-1 Patient   DME arranged  NA      DME agency  NA     [place] arranged  HH-2 PT      [place]   Status of service:  Completed, signed off   Discharge Disposition:  [place]

## 2011-07-10 NOTE — Progress Notes (Signed)
Patient ID: Roy Dyer, male   DOB: [birth year], 46 y.o.   MRN: [id number] PATIENT ID: Roy Dyer  MRN: [id number]  DOB/AGE:  Mar 03, 1966 / 46 y.o.  1 Day Post-Op Procedure(s) (LRB): TOTAL HIP ARTHROPLASTY (Right)    PROGRESS NOTE Subjective: Patient is alert, oriented,noNausea, no Vomiting, yes passing gas, no Bowel Movement. Taking PO wel. Denies SOB, Chest or Calf Pain. Using Incentive Spirometer, PAS in place. Ambulate WBAT Patient reports pain as 4 on 0-10 scale  .    Objective: Vital signs in last 24 hours: Filed Vitals:   07/09/11 2130 07/09/11 2134 07/10/11 0157 07/10/11 0603  BP: 101/58 101/58 97/59 97/59   Pulse:  65 69 77  Temp:  97.6 F (36.4 C) 97.7 F (36.5 C) 98.3 F (36.8 C)  TempSrc:      Resp:  18 20 20   SpO2:  100% 100% 100%      Intake/Output from previous day: I/O last 3 completed shifts: In: [id number] [P.O.:200; I.V.:1000; IV Piggyback:55] Out: 325 [Urine:75; Blood:250]   Intake/Output this shift:     LABORATORY DATA:  Basename 07/10/11 0528  WBC 8.8  HGB 9.3*  HCT 27.2*  PLT 102*  NA 132*  K 5.0  CL 103  CO2 20  BUN 53*  CREATININE 5.91*  GLUCOSE 138*  GLUCAP --  INR 1.36  CALCIUM 8.6    Examination: Neurologically intact ABD soft Neurovascular intact Sensation intact distally Intact pulses distally Dorsiflexion/Plantar flexion intact Incision: moderate drainage No cellulitis present Compartment soft} XR AP&Lat of hip shows well placed\fixed THA  Assessment:   1 Day Post-Op Procedure(s) (LRB): TOTAL HIP ARTHROPLASTY (Right) ADDITIONAL DIAGNOSIS:  Hypertension and Renal Insufficiency Chronic  Plan: PT/OT WBAT, THA  posterior precautions 300 cc fluid bolus today  DVT Prophylaxis: Lovenox\Coumadin bridge, monitor INR 1.5-2.0 target  DISCHARGE PLAN: Home  DISCHARGE NEEDS: HHPT, HHRN, CPM, Walker and 3-in-1 comode seat

## 2011-07-11 LAB — BASIC METABOLIC PANEL
CO2: 17 mEq/L — ABNORMAL LOW (ref 19–32)
Chloride: 102 mEq/L (ref 96–112)
GFR calc Af Amer: 11 mL/min — ABNORMAL LOW (ref >90)
Potassium: 5.1 mEq/L (ref 3.5–5.1)
Sodium: 130 mEq/L — ABNORMAL LOW (ref 135–145)

## 2011-07-11 LAB — CBC
Platelets: 95 10*3/uL — ABNORMAL LOW (ref 150–400)
RBC: 2.45 MIL/uL — ABNORMAL LOW (ref 4.22–5.81)
RDW: 14.5 % (ref 11.5–15.5)
WBC: 10 10*3/uL (ref 4.0–10.5)

## 2011-07-11 LAB — PROTIME-INR: INR: 4.09 — ABNORMAL HIGH (ref 0.00–1.49)

## 2011-07-11 MED ORDER — METOPROLOL TARTRATE 25 MG PO TABS
25.0000 mg | ORAL_TABLET | Freq: Two times a day (BID) | ORAL | Status: DC
  Administered 2011-07-11 – 2011-07-16 (×10): 25 mg via ORAL
  Filled 2011-07-11 (×13): qty 1

## 2011-07-11 MED ORDER — ALLOPURINOL 100 MG PO TABS
100.0000 mg | ORAL_TABLET | Freq: Every day | ORAL | Status: DC
  Administered 2011-07-12 – 2011-07-20 (×9): 100 mg via ORAL
  Filled 2011-07-11 (×9): qty 1

## 2011-07-11 MED ORDER — DARBEPOETIN ALFA-POLYSORBATE 60 MCG/0.3ML IJ SOLN
60.0000 ug | INTRAMUSCULAR | Status: DC
  Administered 2011-07-11: 60 ug via SUBCUTANEOUS
  Filled 2011-07-11: qty 0.3

## 2011-07-11 MED ORDER — OXYCODONE-ACETAMINOPHEN 5-325 MG PO TABS
1.0000 | ORAL_TABLET | ORAL | Status: DC | PRN
  Administered 2011-07-12 – 2011-07-18 (×11): 2 via ORAL
  Filled 2011-07-11 (×10): qty 2

## 2011-07-11 MED ORDER — WARFARIN - PHARMACIST DOSING INPATIENT
Freq: Every day | Status: DC
  Filled 2011-07-11 (×2): qty 1

## 2011-07-11 NOTE — Progress Notes (Signed)
ANTICOAGULATION CONSULT NOTE   Pharmacy Consult for coumadin Indication: VTE prophylaxis  No Known Allergies  Patient Measurements:   Heparin Dosing Weight:202 lb  Vital Signs: Temp: 98.2 F (36.8 C) (02/27 0800) BP: 100/64 mmHg (02/27 0800) Pulse Rate: 87  (02/27 0800)  Labs:  Basename 07/11/11 0945 07/10/11 0528  HGB 8.2* 9.3*  HCT 23.4* 27.2*  PLT 95* 102*  APTT -- --  LABPROT 40.3* 17.0*  INR 4.09* 1.36  HEPARINUNFRC -- --  CREATININE 6.24* 5.91*  CKTOTAL -- --  CKMB -- --  TROPONINI -- --   The CrCl is unknown because both a height and weight (above a minimum accepted value) are required for this calculation.  Medical History: Past Medical History  Diagnosis Date  . Lupus   . CAD (coronary artery disease)      stents RCA/Circ 2001, BMS to LAD 07/2010  . CRF (chronic renal failure)       Baseline Cr 4.5 as of 04/2011 (GFR<20), Dr. Clover Mealy:  he is being prepped for dialysis and referred to [facility] for transplant consideration.  . Cardiomyopathy     Ischemic:  02/2011 EF 30-35%.   Single chamber ICD 11/20/10 ([name])  . Gout   . 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 Left ventricular thrombus 2012    Re-eval 02/2011 showed thrombus RESOLVED, so coumadin was d/c'd (was on it for 93mo)  . implantable cardiac defibrillator single chamber     Medtronic  . Hypertension   . Anemia   . Heart attack 2001 and 2012    3 stents and defib placed in July  . GERD (gastroesophageal reflux disease)   . Hiatal hernia     Medications:  Prescriptions prior to admission  Medication Sig Dispense Refill  . allopurinol (ZYLOPRIM) 300 MG tablet Take 300 mg by mouth daily.      [name] aspirin 81 MG tablet Take 1 tablet (81 mg total) by mouth daily.      [name] atorvastatin (LIPITOR) 80 MG tablet Take 80 mg by mouth daily.      . calcitRIOL (ROCALTROL) 0.25 MCG capsule Take 0.25 mcg by mouth daily.      . clopidogrel (PLAVIX) 75 MG  tablet Take 75 mg by mouth daily.      [name] esomeprazole (NEXIUM) 40 MG capsule Take 40 mg by mouth daily before breakfast.        . fluticasone (FLOVENT HFA) 44 MCG/ACT inhaler Inhale 1 puff into the lungs 2 (two) times daily.      . furosemide (LASIX) 40 MG tablet Take 40 mg by mouth daily as needed. For fluid.      . isosorbide mononitrate (IMDUR) 30 MG 24 hr tablet Take 30 mg by mouth daily.      . metoprolol (LOPRESSOR) 100 MG tablet Take 100 mg by mouth 2 (two) times daily.      . niacin (NIASPAN) 500 MG CR tablet Take 1,000 mg by mouth at bedtime.       [name] NIFEdipine (PROCARDIA) 10 MG capsule Take 10 mg by mouth 3 (three) times daily.      . traMADol (ULTRAM) 50 MG tablet Take 1 tablet by mouth Every 6 hours as needed. For pain      . dexlansoprazole (DEXILANT) 60 MG capsule Take 60 mg by mouth daily.      . nitroGLYCERIN (NITROSTAT) 0.4 MG SL tablet Place 0.4 mg under the  tongue every 5 (five) minutes as needed. For chest pains      . oxyCODONE-acetaminophen (TYLOX) 5-500 MG per capsule Take 1 tablet by mouth Every 6 hours as needed. For pain        Assessment: 46 yo male s/p hip arthroplasty on coumadin for DVT px  Coumadin score =8  He states while on coumadin in the past his dose was 5 mg on Monday, 2.5 mg other days.  INR today has increased dramatically to 4.09. Goal of Therapy:  INR 1.5-2   Plan:  No coumadin today. Daily INR/PT  Aileene Lanum Poteet 07/11/2011,12:11 PM

## 2011-07-11 NOTE — Progress Notes (Signed)
PATIENT ID: NANG HAVEL  MRN: [id number]  DOB/AGE:  22-Mar-1966 / 46 y.o.  2 Days Post-Op Procedure(s) (LRB): TOTAL HIP ARTHROPLASTY (Right)    PROGRESS NOTE Subjective: Patient is alert, oriented,noNausea, no Vomiting, yes passing gas, no Bowel Movement. Taking PO well.  Voiding well. Denies SOB, Chest or Calf Pain. Using Incentive Spirometer, PAS in place. Patient reports pain as moderate  .    Objective: Vital signs in last 24 hours: Filed Vitals:   07/10/11 2056 07/10/11 2225 07/11/11 0220 07/11/11 0607  BP:  111/71 122/75 99/64  Pulse:  92 86 86  Temp:  97.8 F (36.6 C) 98.2 F (36.8 C) 97.8 F (36.6 C)  TempSrc:      Resp:  19 18 19   SpO2: 98% 100% 99% 100%      Intake/Output from previous day: I/O last 3 completed shifts: In: 1860 [P.O.:360; I.V.:1500] Out: 925 [Urine:925]   Intake/Output this shift:     LABORATORY DATA:  Basename 07/10/11 0528  WBC 8.8  HGB 9.3*  HCT 27.2*  PLT 102*  NA 132*  K 5.0  CL 103  CO2 20  BUN 53*  CREATININE 5.91*  GLUCOSE 138*  GLUCAP --  INR 1.36  CALCIUM 8.6    Examination: Neurologically intact ABD soft Neurovascular intact Sensation intact distally Intact pulses distally Dorsiflexion/Plantar flexion intact Incision: moderate drainage} XR AP&Lat of hip shows well placed\fixed THA  Assessment:   2 Days Post-Op Procedure(s) (LRB): TOTAL HIP ARTHROPLASTY (Right) ADDITIONAL DIAGNOSIS:  Renal Insufficiency Chronic  Plan: PT/OT WBAT, THA  posterior precautions.  Change pain meds from norco to percocet.  Change dressing.  Will need BMET and CBC today and tomorrow.  DVT Prophylaxis: Coumadin, monitor INR 1.5-2.0 target  DISCHARGE PLAN: Home  DISCHARGE NEEDS: HHPT, HHRN, Walker and 3-in-1 comode seat

## 2011-07-11 NOTE — Progress Notes (Signed)
Physical Therapy Treatment Patient Details Name: Roy Dyer MRN: [id number] DOB: Nov 10, 1965 Today's Date: 07/11/2011  PT Assessment/Plan  PT - Assessment/Plan Comments on Treatment Session: Pt is motivated, with good participation in therex; concerned about renal status PT Plan: Discharge plan remains appropriate PT Frequency: 7X/week Follow Up Recommendations: Home health PT;Supervision/Assistance - 24 hour Equipment Recommended: None recommended by PT PT Goals  Acute Rehab PT Goals Time For Goal Achievement: 7 days Pt will go Supine/Side to Sit: with supervision PT Goal: Supine/Side to Sit - Progress: Progressing toward goal Pt will go Sit to Supine/Side: with supervision PT Goal: Sit to Supine/Side - Progress: Progressing toward goal Pt will go Sit to Stand: with supervision PT Goal: Sit to Stand - Progress: Met Pt will go Stand to Sit: with supervision PT Goal: Stand to Sit - Progress: Progressing toward goal Pt will Ambulate: >150 feet;with supervision;with rolling walker PT Goal: Ambulate - Progress: Not progressing Pt will Go Up / Down Stairs: 3-5 stairs;with rail(s);with min assist PT Goal: Up/Down Stairs - Progress: Other (comment) Pt will Perform Home Exercise Program: with supervision, verbal cues required/provided PT Goal: Perform Home Exercise Program - Progress: Progressing toward goal Additional Goals Additional Goal #1: Pt will verbalize and adhere to posterior Hip Prec with mobility acts PT Goal: Additional Goal #1 - Progress: Progressing toward goal  PT Treatment Precautions/Restrictions  Precautions Precautions: Posterior Hip Precaution Booklet Issued: Yes (comment) Precaution Comments: Pt able to recall 3/3 Post Hip Prec Restrictions Weight Bearing Restrictions: No RLE Weight Bearing: Weight bearing as tolerated Mobility (including Balance) Bed Mobility Supine to Sit: HOB flat;4: Min assist Supine to Sit Details (indicate cue type and reason): cues  for post hip prec; discussed using sheet roll as leg lifter Sit to Supine: 4: Min assist;HOB flat Sit to Supine - Details (indicate cue type and reason): min assist for RLE Transfers Sit to Stand: 5: Supervision Sit to Stand Details (indicate cue type and reason): Verbal and demo cues for post hip prec; pt particularly concerned with on/off commode Stand to Sit: 4: Min assist Stand to Sit Details: Verbal and demo cues for post hip prec; pt particularly concerned with on/off commode Ambulation/Gait Ambulation/Gait Assistance: 5: Supervision Ambulation/Gait Assistance Details (indicate cue type and reason): cues for post hip prec with turns; Pt politely declined progressive ambulation, concerned for medical/renal status, fatigue, and possibility of needing HD tomorrow Ambulation Distance (Feet): 4 Feet (pivot steps recliner to bed to recliner) Assistive device: Rolling walker Gait Pattern: Step-to pattern    Exercise  Total Joint Exercises Quad Sets: AROM;Right;10 reps;Supine Gluteal Sets: AROM;Both;10 reps;Supine Towel Squeeze: AROM;Both;10 reps;Supine Short Arc Quad: AROM;Right;10 reps;Supine Heel Slides: AAROM;Right;10 reps;Supine Hip ABduction/ADduction: AAROM;Right;10 reps;Supine Bridges: AROM;Both;10 reps;Supine End of Session PT - End of Session Activity Tolerance: Patient limited by fatigue Patient left: in chair;with call bell in reach General Behavior During Session: [facility] for tasks performed Cognition: [facility] for tasks performed  Roney Marion [facility] [place], [place]  07/11/2011, 5:07 PM

## 2011-07-11 NOTE — Consult Note (Signed)
Reason for Consult:progressive kidney disease Referring Physician: Mayer Camel, MD  Roy Dyer is an 46 y.o. male.  HPI: Pt is a 46 yo [facility] PMH sig for SLE, CAD, ischemic CMP, CKD stage 4/5 (baseline creat 4.5-4.8), and AVN who is s/p right THA on 07/08/11.  We were asked to see the pt due to progressive increase in serum creatinine and decreased UOP.  The trend in creat is seen below:  Trend in Creatinine: Creatinine, Ser  Date/Time Value Range Status  07/11/2011  9:45 AM 6.24* 0.50-1.35 (mg/dL) Final  07/10/2011  5:28 AM 5.91* 0.50-1.35 (mg/dL) Final  07/02/2011 11:29 AM 4.83* 0.50-1.35 (mg/dL) Final  06/27/2011  9:49 AM 5.0* 0.4-1.5 (mg/dL) Final  04/23/2011  8:20 AM 4.4* 0.4-1.5 (mg/dL) Final  03/28/2011  7:34 AM 4.87* 0.50-1.35 (mg/dL) Final  02/22/2011  9:40 AM 4.7* 0.4-1.5 (mg/dL) Final  11/29/2010  9:57 AM 4.3* 0.4-1.5 (mg/dL) Final  11/21/2010 11:30 AM 3.24* 0.50-1.35 (mg/dL) Final  11/20/2010  7:20 AM 3.70* 0.50-1.35 (mg/dL) Final  10/29/2010  4:30 AM 3.00* 0.50-1.35 (mg/dL) Final  10/23/2010  9:39 AM 3.3* 0.4-1.5 (mg/dL) Final  09/15/2010  9:00 AM 4.30* 0.4-1.5 (mg/dL) Final  08/07/2010  4:06 AM 3.18* 0.4-1.5 (mg/dL) Final  08/06/2010  4:55 AM 3.29* 0.4-1.5 (mg/dL) Final  08/05/2010  5:00 AM 3.17* 0.4-1.5 (mg/dL) Final  08/04/2010  4:32 AM 3.17* 0.4-1.5 (mg/dL) Final  08/03/2010  7:01 AM 2.71* 0.4-1.5 (mg/dL) Final  08/02/2010  9:26 AM 2.7* 0.4-1.5 (mg/dL) Final  07/05/2010  5:05 AM 2.99* 0.4-1.5 (mg/dL) Final  07/03/2010 11:56 PM 2.91* 0.4-1.5 (mg/dL) Final  02/26/2009  5:40 AM 2.81* 0.4-1.5 (mg/dL) Final  02/25/2009  5:16 AM 2.99* 0.4-1.5 (mg/dL) Final  02/24/2009  4:30 AM 3.34* 0.4-1.5 (mg/dL) Final  02/17/2009  9:08 AM 2.96* 0.4-1.5 (mg/dL) Final    PMH:   Past Medical History  Diagnosis Date  . Lupus   . CAD (coronary artery disease)      stents RCA/Circ 2001, BMS to LAD 07/2010  . CRF (chronic renal failure)       Baseline Cr 4.5 as of 04/2011 (GFR<20), Dr. Clover Mealy:  he is being  prepped for dialysis and referred to [facility] for transplant consideration.  . Cardiomyopathy     Ischemic:  02/2011 EF 30-35%.   Single chamber ICD 11/20/10 ([name])  . Gout   . 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 Left ventricular thrombus 2012    Re-eval 02/2011 showed thrombus RESOLVED, so coumadin was d/c'd (was on it for 67mo)  . implantable cardiac defibrillator single chamber     Medtronic  . Hypertension   . Anemia   . Heart attack 2001 and 2012    3 stents and defib placed in July  . GERD (gastroesophageal reflux disease)   . Hiatal hernia     PSH:   Past Surgical History  Procedure Date  . Stents     05-2010 and 2- in 2010  . Partial hip arthroplasty     left  . Renal biopsy   . Tympanostomy tube placement 46 yrs old  . Joint replacement   . Av fistula placement 03/28/2011    Procedure: ARTERIOVENOUS (AV) FISTULA CREATION;  Surgeon: Rosetta Posner, MD;  Location: [place];  Service: Vascular;  Laterality: Left;  Creation of Left radiocephallic cimino fistula  . Cardiac catheterization     08/2010  . US echocardiography 12/2010  . Cardiovascular stress test  07/2010  . Av fistula placement 04/27/2011    Procedure: ARTERIOVENOUS (AV) FISTULA CREATION;  Surgeon: Rosetta Posner, MD;  Location: [place];  Service: Vascular;  Laterality: Left;  left basilic vein transposition  . Insert / replace / remove pacemaker     medtronic        dr Juleen China    [place]   icd only  . Total hip arthroplasty 07/09/2011    Procedure: TOTAL HIP ARTHROPLASTY;  Surgeon: Kerin Salen, MD;  Location: [place];  Service: Orthopedics;  Laterality: Right;    Allergies: No Known Allergies  Medications:   Prior to Admission medications   Medication Sig Start Date End Date Taking? Authorizing Provider  allopurinol (ZYLOPRIM) 300 MG tablet Take 300 mg by mouth daily.   Yes Historical Provider, MD  aspirin 81 MG tablet Take 1 tablet (81 mg total) by mouth daily.  09/25/10  Yes Deboraha Sprang, MD  atorvastatin (LIPITOR) 80 MG tablet Take 80 mg by mouth daily.   Yes Historical Provider, MD  calcitRIOL (ROCALTROL) 0.25 MCG capsule Take 0.25 mcg by mouth daily.   Yes Historical Provider, MD  clopidogrel (PLAVIX) 75 MG tablet Take 75 mg by mouth daily.   Yes Historical Provider, MD  esomeprazole (NEXIUM) 40 MG capsule Take 40 mg by mouth daily before breakfast.     Yes Historical Provider, MD  fluticasone (FLOVENT HFA) 44 MCG/ACT inhaler Inhale 1 puff into the lungs 2 (two) times daily.   Yes Historical Provider, MD  furosemide (LASIX) 40 MG tablet Take 40 mg by mouth daily as needed. For fluid. 02/22/11  Yes [name], MD  isosorbide mononitrate (IMDUR) 30 MG 24 hr tablet Take 30 mg by mouth daily.   Yes Historical Provider, MD  metoprolol (LOPRESSOR) 100 MG tablet Take 100 mg by mouth 2 (two) times daily.   Yes Historical Provider, MD  niacin (NIASPAN) 500 MG CR tablet Take 1,000 mg by mouth at bedtime.    Yes Historical Provider, MD  NIFEdipine (PROCARDIA) 10 MG capsule Take 10 mg by mouth 3 (three) times daily.   Yes Historical Provider, MD  traMADol (ULTRAM) 50 MG tablet Take 1 tablet by mouth Every 6 hours as needed. For pain 05/17/11  Yes Historical Provider, MD  dexlansoprazole (DEXILANT) 60 MG capsule Take 60 mg by mouth daily.    Historical Provider, MD  nitroGLYCERIN (NITROSTAT) 0.4 MG SL tablet Place 0.4 mg under the tongue every 5 (five) minutes as needed. For chest pains    Historical Provider, MD  oxyCODONE-acetaminophen (TYLOX) 5-500 MG per capsule Take 1 tablet by mouth Every 6 hours as needed. For pain 05/02/11   Historical Provider, MD    Discontinued Meds:   Medications Discontinued During This Encounter  Medication Reason  . sodium chloride irrigation 0.9 % Patient Discharge  . bupivacaine-EPINEPHrine (PF) (MARCAINE) 0.25-1:200000 % injection Patient Discharge  . ceFAZolin (ANCEF) IVPB 2 g/50 mL premix Patient Transfer  . dextrose 5  %-0.45 % sodium chloride infusion Patient Transfer  . chlorhexidine (HIBICLENS) 4 % liquid 4 application Patient Transfer  . HYDROmorphone (DILAUDID) injection 0.25-0.5 mg Patient Transfer  . meperidine (DEMEROL) injection 6.25-12.5 mg Patient Transfer  . ondansetron (ZOFRAN) injection 4 mg Patient Transfer  . morphine 2 MG/ML injection 0.05 mg/kg Patient Transfer  . pantoprazole (PROTONIX) EC tablet 40 mg Duplicate  . pantoprazole (PROTONIX) EC tablet 40 mg   . pantoprazole (PROTONIX) EC tablet 40 mg Entry Error  . HYDROcodone-acetaminophen (NORCO) 5-325  MG per tablet 1-2 tablet     Social History:  reports that he quit smoking about 11 months ago. His smoking use included Cigarettes. He has a 15 pack-year smoking history. He has never used smokeless tobacco. He reports that he does not drink alcohol or use illicit drugs.  Family History:   Family History  Problem Relation Age of Onset  . Kidney failure Mother   . Lupus Mother   . Stroke Mother   . Diabetes Mother     type 2  . Heart attack Father     X 7  . Hypertension Father   . Hyperlipidemia Father   . Heart attack Sister 64    X 1  . Hyperlipidemia Sister   . Hypertension Sister   . Heart disease Sister   . Heart attack Paternal Grandfather   . Heart disease Paternal Aunt   . Heart disease Paternal Uncle     A comprehensive review of systems was negative except for: Constitutional: positive for anorexia and fatigue Gastrointestinal: positive for nausea Genitourinary: positive for decreased urination Musculoskeletal: positive for arthralgias Allergic/Immunologic: positive for pruritis and raynaud's of fingers and toes  Blood pressure 104/66, pulse 88, temperature 98.2 F (36.8 C), temperature source Oral, resp. rate 14, SpO2 100.00%. General appearance: alert, cooperative, fatigued and no distress Head: Normocephalic, without obvious abnormality, atraumatic Neck: no adenopathy, no carotid bruit, no JVD, supple,  symmetrical, trachea midline and thyroid not enlarged, symmetric, no tenderness/mass/nodules Resp: clear to auscultation bilaterally Cardio: regular rate and rhythm, S1, S2 normal, no murmur, click, rub or gallop GI: soft, non-tender; bowel sounds normal; no masses,  no organomegaly Extremities: edema trace pretib edema, +Raynauds of b/l great toes and index fingers and LUE AVF +T/B Neurologic: Grossly normal  Labs: Basic Metabolic Panel:  Lab [id number] 07/10/11 0528  NA 130* 132*  K 5.1 5.0  CL 102 103  CO2 17* 20  GLUCOSE 120* 138*  BUN 56* 53*  CREATININE 6.24* 5.91*  ALB -- --  CALCIUM 8.5 8.6  PHOS -- --   Liver Function Tests: No results found for this basename: AST:3,ALT:3,ALKPHOS:3,BILITOT:3,PROT:3,ALBUMIN:3 in the last 168 hours No results found for this basename: LIPASE:3,AMYLASE:3 in the last 168 hours No results found for this basename: AMMONIA:3 in the last 168 hours CBC:  Lab 07/11/11 0945 07/10/11 0528  WBC 10.0 8.8  NEUTROABS -- --  HGB 8.2* 9.3*  HCT 23.4* 27.2*  MCV 95.5 97.1  PLT 95* 102*   PT/INR: @labrcntip (inr:5) Cardiac Enzymes: No results found for this basename: CKTOTAL:5,CKMB:5,CKMBINDEX:5,TROPONINI:5 in the last 168 hours CBG: No results found for this basename: GLUCAP:5 in the last 168 hours  Iron Studies: No results found for this basename: IRON:30,TIBC:30,TRANSFERRIN:30,FERRITIN:30 in the last 168 hours  Xrays/Other Studies: No results found.   Assessment/Plan: 1.  AKI/CKD stage 4/5- pt has been relatively hypotensive since admission (normally with SBP in 120's-140's now in the 90's).  Will stop IVF with KCl and decrease metoprolol.  No acute indication for HD, however we did discuss his advanced CKD at baseline.  Will follow over the next 24 hours and keep SBP>100.  Continue with lasix and renal dose meds.  Possible HD if renal function cont to worsen 2. Hyperkalemia- due to #1.  Stop IVF with KCl.  Cont with lasix and  follow 3. Metabolic acidosis- add bicarb po 4. AVN s/p RORIF- pain control and PT/OT 5. ABLA/anemia of chronic disease- will check iron stores, start epo and transfuse if hgb  continues to fall and/or he develops ischemic symptoms 6. CAD- currently stable. On plavix, NTG, Betablocker 7. HTN- now hypotensive.  Decrease metoprolol 8. Gout decrease allopurinol to 100mg  qd 9. SLE- stable 10. Raynaud's- cont with nifedipine   Roy Dyer A 07/11/2011, 2:04 PM

## 2011-07-11 NOTE — Progress Notes (Signed)
Physical Therapy Note   07/11/11 1121  PT Visit Information  Reason Eval/Treat Not Completed Patient refused (Politely declined 2/2 fatigue; will return for pm session)    Roney Marion, [place]

## 2011-07-11 NOTE — Progress Notes (Signed)
Utilization review completed. Rozanna Boer, RN, BSN. 07/11/11

## 2011-07-12 LAB — PROTIME-INR
INR: 5.84 (ref 0.00–1.49)
Prothrombin Time: 53.2 seconds — ABNORMAL HIGH (ref 11.6–15.2)

## 2011-07-12 LAB — CBC
HCT: 23.1 % — ABNORMAL LOW (ref 39.0–52.0)
Hemoglobin: 7.9 g/dL — ABNORMAL LOW (ref 13.0–17.0)
MCHC: 34.2 g/dL (ref 30.0–36.0)
RDW: 14.9 % (ref 11.5–15.5)
WBC: 8.5 10*3/uL (ref 4.0–10.5)

## 2011-07-12 LAB — HEPATITIS B CORE ANTIBODY, IGM: Hep B C IgM: NEGATIVE

## 2011-07-12 LAB — RENAL FUNCTION PANEL
Albumin: 2.1 g/dL — ABNORMAL LOW (ref 3.5–5.2)
BUN: 60 mg/dL — ABNORMAL HIGH (ref 6–23)
Chloride: 103 mEq/L (ref 96–112)
GFR calc Af Amer: 10 mL/min — ABNORMAL LOW (ref >90)
Glucose, Bld: 114 mg/dL — ABNORMAL HIGH (ref 70–99)
Phosphorus: 5.1 mg/dL — ABNORMAL HIGH (ref 2.3–4.6)
Potassium: 5.3 mEq/L — ABNORMAL HIGH (ref 3.5–5.1)
Sodium: 131 mEq/L — ABNORMAL LOW (ref 135–145)

## 2011-07-12 LAB — HEPATITIS B SURFACE ANTIGEN: Hepatitis B Surface Ag: NEGATIVE

## 2011-07-12 LAB — HEPATITIS C ANTIBODY (REFLEX): HCV Ab: NEGATIVE

## 2011-07-12 LAB — PREPARE RBC (CROSSMATCH)

## 2011-07-12 LAB — HEPATITIS B SURFACE ANTIBODY,QUALITATIVE: Hep B S Ab: NEGATIVE

## 2011-07-12 MED ORDER — SODIUM BICARBONATE 650 MG PO TABS
1300.0000 mg | ORAL_TABLET | Freq: Three times a day (TID) | ORAL | Status: DC
  Administered 2011-07-12 – 2011-07-18 (×18): 1300 mg via ORAL
  Filled 2011-07-12 (×21): qty 2

## 2011-07-12 MED ORDER — FUROSEMIDE 10 MG/ML IJ SOLN
INTRAMUSCULAR | Status: AC
  Filled 2011-07-12: qty 4

## 2011-07-12 MED ORDER — PHYTONADIONE 5 MG PO TABS
5.0000 mg | ORAL_TABLET | Freq: Once | ORAL | Status: AC
  Administered 2011-07-12: 5 mg via ORAL
  Filled 2011-07-12 (×2): qty 1

## 2011-07-12 MED ORDER — FUROSEMIDE 10 MG/ML IJ SOLN: 40.0000 mg | Freq: Once | INTRAMUSCULAR | Status: DC

## 2011-07-12 NOTE — Progress Notes (Signed)
Pt is s/p R femoral head avascular necrosis. Pt is to be WBAT with RW. Pt requires one assist for ambulation. Pt has 2+/3+ pitting edema of RLE  And R hip. RLE and R hip bruised. R hip dressing has a mod amount of bldy drainage. Dressing changed. Incision well approximated. Incision cleaned with normal saline due to bldy drainage noted on the approximation line. Ice to R hip. New mepilex applied. Pt is alert and oriented x 3. Pt lungs CTA. Pt repts that he has has some SOB with exertion and intermittently with this hospitalization. Pt repts no change in his breathing pattern. Pt sats 95% RA. Pt performs IS per order. Pt repts nonprod cough. Pt has no s/sx resp distress and no c/o such at rest. Heart rate tachy in the low 100's. Heart rate regular rate and rhythm. No s/sx cardiac distress and no c/o such. Pt voids in urinal pale clear yellow urine. No odor noted. Pt has a history of renal insufficiency. Pt has an AV fistula of L upper arm that has + bruit and + thrill. Pt repts LBM 2/27. Pt denies nausea or vomiting and tolerates diet fair to good. Pt repts passing gas. BS+x4. No pressure skin issues noted of heels or sacral area. Pt turns/tilts self and floats heels.

## 2011-07-12 NOTE — Progress Notes (Signed)
PATIENT ID: Roy Dyer  MRN: [id number]  DOB/AGE:  45-Dec-1967 / 45 y.o.  3 Days Post-Op Procedure(s) (LRB): TOTAL HIP ARTHROPLASTY (Right)    PROGRESS NOTE Subjective: Patient is alert, oriented,noNausea, no Vomiting, yes passing gas, no Bowel Movement. Taking PO well. Denies SOB, Chest or Calf Pain. Using Incentive Spirometer, PAS in place. Ambulating with PT.  Complains of SOB.  Voiding.  Was seen by nephrology yesterday.   Patient reports pain as moderate  .    Objective: Vital signs in last 24 hours: Filed Vitals:   07/11/11 2210 07/11/11 2236 07/12/11 0602 07/12/11 0700  BP:  107/62 105/57   Pulse:  103 105   Temp:   98 F (36.7 C)   TempSrc:      Resp:   20   SpO2: 100%  95% 100%      Intake/Output from previous day: I/O last 3 completed shifts: In: 2300 [P.O.:800; I.V.:1500] Out: 2900 [Urine:2900]   Intake/Output this shift:     LABORATORY DATA:  Basename 07/12/11 0600 07/11/11 0945  WBC 8.5 10.0  HGB 7.9* 8.2*  HCT 23.1* 23.4*  PLT 112* 95*  NA 131* 130*  K 5.3* 5.1  CL 103 102  CO2 15* 17*  BUN 60* 56*  CREATININE 6.71* 6.24*  GLUCOSE 114* 120*  GLUCAP -- --  INR 5.84* 4.09*  CALCIUM 9.1 --    Examination: Neurologically intact ABD soft Neurovascular intact Sensation intact distally Intact pulses distally Dorsiflexion/Plantar flexion intact Incision: dressing C/D/I} XR AP&Lat of hip shows well placed\fixed THA  Assessment:   3 Days Post-Op Procedure(s) (LRB): TOTAL HIP ARTHROPLASTY (Right) ADDITIONAL DIAGNOSIS:  Acute Blood Loss Anemia, chronic renal insufficiency  Plan: PT/OT WBAT, THA  posterior precautions  D/C coumadin - INR 5.84 today.  Will give Vit K.  Hgb 7.9 today - will transfuse 1 unit PRBC.    DISCHARGE PLAN: Home  DISCHARGE NEEDS: HHPT, HHRN, Walker and 3-in-1 comode seat

## 2011-07-12 NOTE — Progress Notes (Signed)
Paged Dr. Tamera Punt concerning PT 53.2 and INR 5.84. No new orders given

## 2011-07-12 NOTE — Progress Notes (Signed)
PT Cancellation Note  Treatment cancelled today due to medical issues with patient which prohibited therapy.  Pt's INR 5.84 & PT 53.2.    Sena Hitch 07/12/2011, 7:40 AM [phone]

## 2011-07-12 NOTE — Progress Notes (Signed)
Patient ID: MIRAN KOHOUT, male   DOB: Sep 24, 1965, 46 y.o.   MRN: [id number] S:feels better today O:BP 105/57  Pulse 105  Temp(Src) 98 F (36.7 C) (Oral)  Resp 20  SpO2 100%  Intake/Output Summary (Last 24 hours) at 07/12/11 1141 Last data filed at 07/12/11 0603  Gross per 24 hour  Intake    240 ml  Output   2000 ml  Net  -1760 ml   Weight change:  Gen:WD WN WM in NAD CVS:RRR Resp:CTA [id number] lower ext edema, LUE AVF +T/B   Lab 07/12/11 0600 07/11/11 0945 07/10/11 0528  NA 131* 130* 132*  K 5.3* 5.1 5.0  CL 103 102 103  CO2 15* 17* 20  GLUCOSE 114* 120* 138*  BUN 60* 56* 53*  CREATININE 6.71* 6.24* 5.91*  ALB -- -- --  CALCIUM 9.1 8.5 8.6  PHOS 5.1* -- --  AST -- -- --  ALT -- -- --   Liver Function Tests:  Lab 07/12/11 0600  AST --  ALT --  ALKPHOS --  BILITOT --  PROT --  ALBUMIN 2.1*   No results found for this basename: LIPASE:3,AMYLASE:3 in the last 168 hours No results found for this basename: AMMONIA:3 in the last 168 hours CBC:  Lab 07/12/11 0600 07/11/11 0945 07/10/11 0528  WBC 8.5 10.0 8.8  NEUTROABS -- -- --  HGB 7.9* 8.2* 9.3*  HCT 23.1* 23.4* 27.2*  MCV 97.5 95.5 97.1  PLT 112* 95* 102*   Cardiac Enzymes: No results found for this basename: CKTOTAL:5,CKMB:5,CKMBINDEX:5,TROPONINI:5 in the last 168 hours CBG: No results found for this basename: GLUCAP:5 in the last 168 hours  Iron Studies:  Basename 07/11/11 1551  IRON 10*  TIBC 154*  TRANSFERRIN --  FERRITIN 132   Studies/Results: No results found.    [name] allopurinol  100 mg Oral Daily  . atorvastatin  80 mg Oral Daily  . calcitRIOL  0.25 mcg Oral Daily  . darbepoetin (ARANESP) injection - DIALYSIS  60 mcg Subcutaneous Weekly  . esomeprazole  40 mg Oral Daily  . fluticasone  1 puff Inhalation BID  . isosorbide mononitrate  30 mg Oral Daily  . metoprolol  25 mg Oral BID  . niacin  1,000 mg Oral QHS  . NIFEdipine  10 mg Oral TID  . phytonadione  5 mg Oral Once  .  warfarin   Does not apply Once  . DISCONTD: allopurinol  300 mg Oral Daily  . DISCONTD: metoprolol  100 mg Oral BID  . DISCONTD: Warfarin - Pharmacist Dosing Inpatient   Does not apply q1800    BMET    Component Value Date/Time   NA 131* 07/12/2011 0600   K 5.3* 07/12/2011 0600   CL 103 07/12/2011 0600   CO2 15* 07/12/2011 0600   GLUCOSE 114* 07/12/2011 0600   BUN 60* 07/12/2011 0600   CREATININE 6.71* 07/12/2011 0600   CALCIUM 9.1 07/12/2011 0600   CALCIUM 9.1 04/23/2011 0820   GFRNONAA 9* 07/12/2011 0600   GFRAA 10* 07/12/2011 0600   CBC    Component Value Date/Time   WBC 8.5 07/12/2011 0600   RBC 2.37* 07/12/2011 0600   HGB 7.9* 07/12/2011 0600   HCT 23.1* 07/12/2011 0600   PLT 112* 07/12/2011 0600   MCV 97.5 07/12/2011 0600   MCH 33.3 07/12/2011 0600   MCHC 34.2 07/12/2011 0600   RDW 14.9 07/12/2011 0600   LYMPHSABS 2.1 07/02/2011 1129   MONOABS 0.6 07/02/2011 1129  EOSABS 0.4 07/02/2011 1129   BASOSABS 0.1 07/02/2011 1129     Assessment/Plan: 1. AKI/CKD stage 4/5- pt has been relatively hypotensive since admission (normally with SBP in 120's-140's now in the 90's). Will stop IVF with KCl and decrease metoprolol. No acute indication for HD, however we did discuss his advanced CKD at baseline. Will follow over the next 24 hours and keep SBP>100. Continue with lasix and renal dose meds. Possible HD if renal function cont to worsen.  Would like another 24 hours and will see how he does post transfusion 2. Hyperkalemia- due to #1. Stop IVF with KCl. Cont with lasix and follow 3. Metabolic acidosis- add bicarb po 4. AVN s/p RORIF- pain control and PT/OT 5. ABLA/anemia of chronic disease- will check iron stores, start epo and transfuse if hgb continues to fall and/or he develops ischemic symptoms.  Agree with transfusion given  Hgb <8 in CAD 6. CAD- currently stable. On plavix, NTG, Betablocker 7. HTN- now hypotensive. Decrease metoprolol 8. Gout decrease allopurinol to 100mg  qd 9. SLE-  stable 10. Raynaud's- cont with nifedipine 11.  [place] A

## 2011-07-13 ENCOUNTER — Other Ambulatory Visit: Payer: Self-pay

## 2011-07-13 ENCOUNTER — Inpatient Hospital Stay (HOSPITAL_COMMUNITY): Payer: Managed Care, Other (non HMO)

## 2011-07-13 DIAGNOSIS — I2589 Other forms of chronic ischemic heart disease: Secondary | ICD-10-CM

## 2011-07-13 DIAGNOSIS — R0602 Shortness of breath: Secondary | ICD-10-CM

## 2011-07-13 LAB — CBC
MCH: 32.4 pg (ref 26.0–34.0)
MCV: 94.7 fL (ref 78.0–100.0)
Platelets: 115 10*3/uL — ABNORMAL LOW (ref 150–400)
RBC: 2.25 MIL/uL — ABNORMAL LOW (ref 4.22–5.81)
RDW: 15.4 % (ref 11.5–15.5)

## 2011-07-13 LAB — RENAL FUNCTION PANEL
Albumin: 1.9 g/dL — ABNORMAL LOW (ref 3.5–5.2)
CO2: 18 mEq/L — ABNORMAL LOW (ref 19–32)
Calcium: 8.9 mg/dL (ref 8.4–10.5)
Creatinine, Ser: 6.79 mg/dL — ABNORMAL HIGH (ref 0.50–1.35)
GFR calc Af Amer: 10 mL/min — ABNORMAL LOW (ref >90)
GFR calc non Af Amer: 9 mL/min — ABNORMAL LOW (ref >90)
Phosphorus: 5.6 mg/dL — ABNORMAL HIGH (ref 2.3–4.6)
Sodium: 134 mEq/L — ABNORMAL LOW (ref 135–145)

## 2011-07-13 LAB — PROTIME-INR: Prothrombin Time: 24.1 seconds — ABNORMAL HIGH (ref 11.6–15.2)

## 2011-07-13 MED ORDER — LIDOCAINE-PRILOCAINE 2.5-2.5 % EX CREA
1.0000 "application " | TOPICAL_CREAM | CUTANEOUS | Status: DC | PRN
  Filled 2011-07-13: qty 5

## 2011-07-13 MED ORDER — HEPARIN SODIUM (PORCINE) 1000 UNIT/ML DIALYSIS
1000.0000 [IU] | INTRAMUSCULAR | Status: DC | PRN
  Filled 2011-07-13: qty 1

## 2011-07-13 MED ORDER — CAMPHOR-MENTHOL 0.5-0.5 % EX LOTN
1.0000 "application " | TOPICAL_LOTION | Freq: Three times a day (TID) | CUTANEOUS | Status: DC | PRN
  Filled 2011-07-13: qty 222

## 2011-07-13 MED ORDER — NEPRO/CARBSTEADY PO LIQD
237.0000 mL | Freq: Three times a day (TID) | ORAL | Status: DC | PRN
  Filled 2011-07-13: qty 237

## 2011-07-13 MED ORDER — FUROSEMIDE 8 MG/ML PO SOLN
40.0000 mg | Freq: Every day | ORAL | Status: DC
  Administered 2011-07-13 – 2011-07-14 (×2): 40 mg via ORAL
  Filled 2011-07-13 (×2): qty 5

## 2011-07-13 MED ORDER — NEPRO/CARBSTEADY PO LIQD
237.0000 mL | ORAL | Status: DC | PRN
  Filled 2011-07-13: qty 237

## 2011-07-13 MED ORDER — CALCIUM CARBONATE 1250 MG/5ML PO SUSP
500.0000 mg | Freq: Four times a day (QID) | ORAL | Status: DC | PRN
  Filled 2011-07-13: qty 5

## 2011-07-13 MED ORDER — SODIUM CHLORIDE 0.9 % IV SOLN: 100.0000 mL | INTRAVENOUS | Status: DC | PRN

## 2011-07-13 MED ORDER — WARFARIN SODIUM 2.5 MG PO TABS
2.5000 mg | ORAL_TABLET | Freq: Once | ORAL | Status: AC
  Administered 2011-07-13: 2.5 mg via ORAL
  Filled 2011-07-13: qty 1

## 2011-07-13 MED ORDER — WARFARIN - PHARMACIST DOSING INPATIENT
Freq: Every day | Status: DC
  Filled 2011-07-13 (×2): qty 1

## 2011-07-13 MED ORDER — ALTEPLASE 2 MG IJ SOLR
2.0000 mg | Freq: Once | INTRAMUSCULAR | Status: AC | PRN
  Filled 2011-07-13: qty 2

## 2011-07-13 MED ORDER — ACETAMINOPHEN 325 MG PO TABS: 650.0000 mg | ORAL_TABLET | Freq: Four times a day (QID) | ORAL | Status: DC | PRN

## 2011-07-13 MED ORDER — OXYCODONE-ACETAMINOPHEN 5-325 MG PO TABS
ORAL_TABLET | ORAL | Status: AC
  Administered 2011-07-13: 2 via ORAL
  Filled 2011-07-13: qty 2

## 2011-07-13 MED ORDER — LIDOCAINE HCL (PF) 1 % IJ SOLN: 5.0000 mL | INTRAMUSCULAR | Status: DC | PRN

## 2011-07-13 MED ORDER — DOCUSATE SODIUM 283 MG RE ENEM
1.0000 | ENEMA | RECTAL | Status: DC | PRN
  Filled 2011-07-13: qty 1

## 2011-07-13 MED ORDER — ONDANSETRON HCL 4 MG/2ML IJ SOLN: 4.0000 mg | Freq: Four times a day (QID) | INTRAMUSCULAR | Status: DC | PRN

## 2011-07-13 MED ORDER — ACETAMINOPHEN 650 MG RE SUPP: 650.0000 mg | Freq: Four times a day (QID) | RECTAL | Status: DC | PRN

## 2011-07-13 MED ORDER — HYDROXYZINE HCL 25 MG PO TABS
25.0000 mg | ORAL_TABLET | Freq: Three times a day (TID) | ORAL | Status: DC | PRN
  Filled 2011-07-13: qty 1

## 2011-07-13 MED ORDER — FUROSEMIDE 10 MG/ML IJ SOLN
INTRAMUSCULAR | Status: AC
  Administered 2011-07-13: 17:00:00
  Filled 2011-07-13: qty 4

## 2011-07-13 MED ORDER — HEPARIN SODIUM (PORCINE) 1000 UNIT/ML DIALYSIS
20.0000 [IU]/kg | INTRAMUSCULAR | Status: DC | PRN
  Administered 2011-07-13 – 2011-07-17 (×2): 1800 [IU] via INTRAVENOUS_CENTRAL
  Filled 2011-07-13: qty 2

## 2011-07-13 MED ORDER — SORBITOL 70 % SOLN
30.0000 mL | Status: DC | PRN
  Filled 2011-07-13 (×2): qty 30

## 2011-07-13 MED ORDER — PENTAFLUOROPROP-TETRAFLUOROETH EX AERO: 1.0000 "application " | INHALATION_SPRAY | CUTANEOUS | Status: DC | PRN

## 2011-07-13 MED ORDER — ONDANSETRON HCL 4 MG PO TABS: 4.0000 mg | ORAL_TABLET | Freq: Four times a day (QID) | ORAL | Status: DC | PRN

## 2011-07-13 MED ORDER — ZOLPIDEM TARTRATE 5 MG PO TABS: 5.0000 mg | ORAL_TABLET | Freq: Every evening | ORAL | Status: DC | PRN

## 2011-07-13 NOTE — Progress Notes (Signed)
PATIENT ID: Roy Dyer  MRN: [id number]  DOB/AGE:  01-29-1966 / 46 y.o.  4 Days Post-Op Procedure(s) (LRB): TOTAL HIP ARTHROPLASTY (Right)    PROGRESS NOTE Subjective: Patient is alert, oriented,noNausea, no Vomiting, yes passing gas, no Bowel Movement. Taking PO well. Complains of SOB, Denies Chest or Calf Pain. Using Incentive Spirometer, PAS in place. Patient reports pain as mild  .    Objective: Vital signs in last 24 hours: Filed Vitals:   07/12/11 2056 07/12/11 2209 07/12/11 2231 07/13/11 0546  BP:  111/71 129/79 108/60  Pulse:  123 120 102  Temp:   97.6 F (36.4 C) 98 F (36.7 C)  TempSrc:      Resp:   18 18  Height:      Weight:      SpO2: 97%  100% 99%      Intake/Output from previous day: I/O last 3 completed shifts: In: 975 [P.O.:375; I.V.:250; Blood:350] Out: 3750 [Urine:3750]   Intake/Output this shift:     LABORATORY DATA:  Basename 07/13/11 0645 07/12/11 0600  WBC 7.3 8.5  HGB 7.3* 7.9*  HCT 21.3* 23.1*  PLT 115* 112*  NA 134* 131*  K 4.6 5.3*  CL 104 103  CO2 18* 15*  BUN 63* 60*  CREATININE 6.79* 6.71*  GLUCOSE 121* 114*  GLUCAP -- --  INR 2.12* 5.84*  CALCIUM 8.9 --    Examination: Neurologically intact ABD soft Neurovascular intact Sensation intact distally Intact pulses distally Dorsiflexion/Plantar flexion intact Incision: moderate drainage} XR AP&Lat of hip shows well placed\fixed THA  Assessment:   4 Days Post-Op Procedure(s) (LRB): TOTAL HIP ARTHROPLASTY (Right) ADDITIONAL DIAGNOSIS:  Acute Blood Loss Anemia and Renal Insufficiency Chronic  Plan: PT/OT WBAT, THA  posterior precautions  Transfuse an additional unit of blood today.  Nephrology is following renal function.  DISCHARGE PLAN: Home  DISCHARGE NEEDS: HHPT, HHRN, Walker and 3-in-1 comode seat

## 2011-07-13 NOTE — Progress Notes (Signed)
Patient ID: Roy Dyer, male   DOB: November 04, 1965, 46 y.o.   MRN: [id number] S:feels weak, no appetite O:BP 127/75  Pulse 112  Temp(Src) 98 F (36.7 C) (Oral)  Resp 18  Ht 5' 6.93" (1.7 m)  Wt 91.9 kg (202 lb 9.6 oz)  BMI 31.80 kg/m2  SpO2 99%  Intake/Output Summary (Last 24 hours) at 07/13/11 1232 Last data filed at 07/13/11 0546  Gross per 24 hour  Intake    350 ml  Output   1500 ml  Net  -1150 ml   Weight change:  [id number], ill-appearing [id number] no rub Resp:crackles at bases Left>right [id number] Ext:1+ on RLE, trace on LLE   Lab 07/13/11 0645 07/12/11 0600 07/11/11 0945 07/10/11 0528  NA 134* 131* 130* 132*  K 4.6 5.3* 5.1 5.0  CL 104 103 102 103  CO2 18* 15* 17* 20  GLUCOSE 121* 114* 120* 138*  BUN 63* 60* 56* 53*  CREATININE 6.79* 6.71* 6.24* 5.91*  ALB -- -- -- --  CALCIUM 8.9 9.1 8.5 8.6  PHOS 5.6* 5.1* -- --  AST -- -- -- --  ALT -- -- -- --   Liver Function Tests:  Lab 07/13/11 0645 07/12/11 0600  AST -- --  ALT -- --  ALKPHOS -- --  BILITOT -- --  PROT -- --  ALBUMIN 1.9* 2.1*   No results found for this basename: LIPASE:3,AMYLASE:3 in the last 168 hours No results found for this basename: AMMONIA:3 in the last 168 hours CBC:  Lab 07/13/11 0645 07/12/11 0600 07/11/11 0945 07/10/11 0528  WBC 7.3 8.5 10.0 --  NEUTROABS -- -- -- --  HGB 7.3* 7.9* 8.2* --  HCT 21.3* 23.1* 23.4* --  MCV 94.7 97.5 95.5 97.1  PLT 115* 112* 95* --   Cardiac Enzymes: No results found for this basename: CKTOTAL:5,CKMB:5,CKMBINDEX:5,TROPONINI:5 in the last 168 hours CBG: No results found for this basename: GLUCAP:5 in the last 168 hours  Iron Studies:  Basename 07/11/11 1551  IRON 10*  TIBC 154*  TRANSFERRIN --  FERRITIN 132   Studies/Results: No results found.    [name] allopurinol  100 mg Oral Daily  . atorvastatin  80 mg Oral Daily  . calcitRIOL  0.25 mcg Oral Daily  . darbepoetin (ARANESP) injection - DIALYSIS  60 mcg Subcutaneous Weekly  . esomeprazole   40 mg Oral Daily  . fluticasone  1 puff Inhalation BID  . furosemide  40 mg Oral Daily  . isosorbide mononitrate  30 mg Oral Daily  . metoprolol  25 mg Oral BID  . niacin  1,000 mg Oral QHS  . NIFEdipine  10 mg Oral TID  . phytonadione  5 mg Oral Once  . sodium bicarbonate  1,300 mg Oral TID  . warfarin   Does not apply Once  . DISCONTD: furosemide  40 mg Intravenous Once    BMET    Component Value Date/Time   NA 134* 07/13/2011 0645   K 4.6 07/13/2011 0645   CL 104 07/13/2011 0645   CO2 18* 07/13/2011 0645   GLUCOSE 121* 07/13/2011 0645   BUN 63* 07/13/2011 0645   CREATININE 6.79* 07/13/2011 0645   CALCIUM 8.9 07/13/2011 0645   CALCIUM 9.1 04/23/2011 0820   GFRNONAA 9* 07/13/2011 0645   GFRAA 10* 07/13/2011 0645   CBC    Component Value Date/Time   WBC 7.3 07/13/2011 0645   RBC 2.25* 07/13/2011 0645   HGB 7.3* 07/13/2011 0645   HCT 21.3* 07/13/2011 0645  PLT 115* 07/13/2011 0645   MCV 94.7 07/13/2011 0645   MCH 32.4 07/13/2011 0645   MCHC 34.3 07/13/2011 0645   RDW 15.4 07/13/2011 0645   LYMPHSABS 2.1 07/02/2011 1129   MONOABS 0.6 07/02/2011 1129   EOSABS 0.4 07/02/2011 1129   BASOSABS 0.1 07/02/2011 1129     Assessment/Plan: 1. 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  Plan for HD today and likely longterm 2. CHF- as above, will need UF with HD as BP tolerates 3. Hyperkalemia- as above 4. Metabolic acidosis- HD today 5. AVN s/p RORIF- pain control and PT/OT  6. ABLA/anemia of chronic disease- will check iron stores, start epo and transfuse if hgb continues to fall and/or he develops ischemic symptoms. Agree with transfusion given Hgb <8 in CAD  7. CAD- currently stable. On plavix, NTG, Betablocker  8. HTN- better. Decrease metoprolol  9. Gout decrease allopurinol to 100mg  qd SLE- stable  10. Raynaud's- cont with nifedipine   Roy Dyer A

## 2011-07-13 NOTE — Progress Notes (Signed)
Pt R hip dressing removed and incision cleaned with peroxide and new mepilex dressings applied. Incision well approximated with staples but noted to have a black goey area at the approximation line. Area appears to be an old "blood clot". Area dried after cleaning with peroxide x 2. Incision is still draining mod amount of bldy drainage but drainage has decreased. Hip remains very edematous and bruised. Ice to hip. No s/sx infection of R hip incision.

## 2011-07-13 NOTE — Progress Notes (Signed)
ANTICOAGULATION CONSULT NOTE - Follow Up Consult  Pharmacy Consult for Coumadin Indication: VTE prophylaxis  No Known Allergies  Patient Measurements: Height: 5' 6.93" (170 cm) Weight: 202 lb 9.6 oz (91.9 kg) IBW/kg (Calculated) : 65.94   Vital Signs: Temp: 98.2 F (36.8 C) (03/01 1234) Temp src: Oral (03/01 1234) BP: 99/61 mmHg (03/01 1234) Pulse Rate: 98  (03/01 1234)  Labs:  Basename 07/13/11 0645 07/12/11 0600 07/11/11 0945  HGB 7.3* 7.9* --  HCT 21.3* 23.1* 23.4*  PLT 115* 112* 95*  APTT -- -- --  LABPROT 24.1* 53.2* 40.3*  INR 2.12* 5.84* 4.09*  HEPARINUNFRC -- -- --  CREATININE 6.79* 6.71* 6.24*  CKTOTAL -- -- --  CKMB -- -- --  TROPONINI -- -- --   Estimated Creatinine Clearance: 15 ml/min (by C-G formula based on Cr of 6.79).   Medications:  Scheduled:    . allopurinol  100 mg Oral Daily  . atorvastatin  80 mg Oral Daily  . calcitRIOL  0.25 mcg Oral Daily  . darbepoetin (ARANESP) injection - DIALYSIS  60 mcg Subcutaneous Weekly  . esomeprazole  40 mg Oral Daily  . fluticasone  1 puff Inhalation BID  . furosemide  40 mg Oral Daily  . isosorbide mononitrate  30 mg Oral Daily  . metoprolol  25 mg Oral BID  . niacin  1,000 mg Oral QHS  . NIFEdipine  10 mg Oral TID  . sodium bicarbonate  1,300 mg Oral TID  . warfarin  2.5 mg Oral ONCE-1800  . warfarin   Does not apply Once  . Warfarin - Pharmacist Dosing Inpatient   Does not apply q1800  . DISCONTD: furosemide  40 mg Intravenous Once   Assessment: 46 y/o male patient receiving coumadin for dvt px. INR became supratherapeutic after 1 dose of 10mg  then 5mg . Following day received vitamin K to reverse, today INR is therapeutic and will resume at lower dose.  Goal of Therapy:  INR 2-3   Plan:  Coumadin 2.5mg  today and f/u in am.  Davonna Belling, PharmD, BCPS Pager [phone] 07/13/2011,1:08 PM

## 2011-07-13 NOTE — Progress Notes (Signed)
PT Cancellation Note  Treatment cancelled today due to patient receiving procedure or test.  Will try again tomorrow.    Catarina Hartshorn, [place] 07/13/2011, 2:10 PM

## 2011-07-13 NOTE — Progress Notes (Signed)
Physical Therapy Note   07/13/11 1300  PT Visit Information  Last PT Received On 07/13/11  Precautions  Precautions Posterior Hip  Restrictions  Weight Bearing Restrictions Yes  RLE Weight Bearing WBAT  Bed Mobility  Bed Mobility No  Transfers  Transfers No  Ambulation/Gait  Ambulation/Gait No  Stairs No  Wheelchair Mobility  Wheelchair Mobility No  Posture/Postural Control  Posture/Postural Control No significant limitations  Balance  Balance Assessed No  Total Joint Exercises  Ankle Circles/Pumps AROM;Both;10 reps  Quad Sets AROM;Both;10 reps  Towel Squeeze AROM;Both;10 reps  Hip ABduction/ADduction AAROM;Right;10 reps  Short Arc Quad AROM;Right;10 reps  Long [address] [place];Right;10 reps  PT - End of Session  Equipment Utilized During Treatment Gait belt  Activity Tolerance Treatment limited secondary to medical complications (Comment)  Patient left in chair;with call bell in reach  Nurse Communication Mobility status for transfers  General  Behavior During Session [facility] for tasks performed  [facility] for tasks performed  PT - Assessment/Plan  Comments on Treatment Session pt presents s/p THA.  pt notes he is to receive blood today and is feeling very weak.  pt agreeable to ther ex only.    PT Plan Discharge plan remains appropriate  PT Frequency 7X/week  Follow Up Recommendations Home health PT;Supervision/Assistance - 24 hour  Equipment Recommended None recommended by PT  Acute Rehab PT Goals  PT Goal: Perform Home Exercise Program - Progress Progressing toward goal    Sander Radon, [place]

## 2011-07-13 NOTE — Progress Notes (Signed)
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 +cms. Pt has 2+ pitting of RLE with bruising and 1+ pitting of LLE. Pt has a dry bulky gauze dressing to R hip. Ice to R hip. R hip incision has been draining large amount of bldy drainage. Pt is WBAT with RW and one assist. Pt is alert and oriented x 3. Neuro check is negative. Pt still repts SOB with exertion but repts that it is "better" today. Lungs CTA but pt repts coughing up a small amount of green thin phlegm. Pt is coughing and deep breathing. Pt heart rate regular rate and rhythm. Pt has an internal pacer. Pt heart rate noted to be 100-120's at times. Pt repts LBM 2/27. Abdomen is soft flat nontender and nondistended. BS+x4. Pt denies nausea or vomiting and tolerates diet fair. Pt repts passing gas. Pt has an AV fistula to L upper arm that has + bruit and + thrill. Pt hasn't undergone dialysis at this time. Pt does void occasional clear yellow urine into the urinal but pt repts a decreased in output. No pressure skin issues. Pt can turn self and floats heels.

## 2011-07-13 NOTE — Consult Note (Signed)
Cardiology Consult Note   Patient ID: Roy Dyer MRN: [id number], DOB/AGE: 1966/03/26   Admit date: 07/09/2011 Date of Consult: 07/13/2011  Primary Physician: [name], MD, MD Primary Cardiologist: [name], MD  Pt. 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  Office note 09/12 mentions he had a history of a large mural apical thrombus in the past and was anticoagulated on Coumadin x 6 months. Most recent repeat echo at that time revealed no evidence of thrombus and Coumadin was stopped. There is mention of records being sent to [facility] for a possible renal transplant. He was continued on DAPT- ASA/Plavix post-BMS. ICD implanted by [name] and interrogated in 10/12 with normal functionality and no adjustments made. In 01/13, Dr. Johnsie Cancel advised he could stop Plavix, allowing for potential orthopedic surgery for which he presented for this admission. Of note, sinus tachycardia was documented in 01/13.   Prior to STEMI last year, he had a negative stress Myoview a month prior, and negative cath in 2005.  Reason for consult: management of cardiac meds, evaluation of shortness of breath  Problem List: Past Medical History  Diagnosis Date  . Lupus   . CAD (coronary artery disease)      stents RCA/Circ 2001, BMS to LAD 07/2010  . CRF (chronic renal failure)       Baseline Cr 4.5 as of 04/2011 (GFR<20), Dr. Clover Mealy:  he is being prepped for dialysis and  referred to [facility] for transplant consideration.  . Cardiomyopathy     Ischemic:  02/2011 EF 30-35%.   Single chamber ICD 11/20/10 ([name])  . Gout   . 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 Left ventricular thrombus 2012    Re-eval 02/2011 showed thrombus RESOLVED, so coumadin was d/c'd (was on it for 58mo)  . implantable cardiac defibrillator single chamber     Medtronic  . Hypertension   . Anemia   . Heart attack 2001 and 2012    3 stents and defib placed in July  . GERD (gastroesophageal reflux disease)   . Hiatal hernia     Past Surgical History  Procedure Date  . Stents     05-2010 and 2- in 2010  . Partial hip arthroplasty     left  . Renal biopsy   . Tympanostomy tube placement 46 yrs old  . Joint replacement   . Av fistula placement 03/28/2011    Procedure: ARTERIOVENOUS (AV) FISTULA CREATION;  Surgeon: Rosetta Posner, MD;  Location: [place];  Service: Vascular;  Laterality: Left;  Creation of Left radiocephallic cimino fistula  . Cardiac catheterization     08/2010  . US echocardiography 12/2010  . Cardiovascular stress test 07/2010  . Av fistula placement 04/27/2011    Procedure: ARTERIOVENOUS (AV) FISTULA CREATION;  Surgeon: Rosetta Posner, MD;  Location: [place];  Service: Vascular;  Laterality: Left;  left basilic vein transposition  . Insert /  replace / remove pacemaker     medtronic        dr Juleen China    [place]   icd only  . Total hip arthroplasty 07/09/2011    Procedure: TOTAL HIP ARTHROPLASTY;  Surgeon: Kerin Salen, MD;  Location: [place];  Service: Orthopedics;  Laterality: Right;     Allergies: No Known Allergies  HPI:   He reports worsening shortness of breath x 2-3 months leading up to this admission. He states he presented to the ED with an acute CHF exacerbation. He states that he weights himself often and takes Lasix as needed when any sharp weight gain occurs. He also endorses a cough productive of pink frothy  sputum. He reports baseline DOE, with baseline activity level limited at 50 steps before becoming short of breath. He endorses associated lightheadedness, palpitations since being in the hospital, has reports orthopnea, PND, and increased LE edema relieved by takes Lasix as needed, but not scheduled due to this chronic kidney disease. He states that he typically needs a Lasix every 3 days. He denies chest pain. He denies fevers, chills, extended travel, but does admit to being fairly sedentary at home.   Currently, he reports mild dyspnea, but is otherwise symptomatic. He has a great amount of insight into his chronic illnesses and is concerned about his elevated HR, stating that this is a recent finding even prior to surgery. He also voiced concerns regarding the functionality of his ICD.   Home Medications: Prior to Admission medications   Medication Sig Start Date End Date Taking? Authorizing Provider  allopurinol (ZYLOPRIM) 300 MG tablet Take 300 mg by mouth daily.   Yes Historical Provider, MD  aspirin 81 MG tablet Take 1 tablet (81 mg total) by mouth daily. 09/25/10  Yes Deboraha Sprang, MD  atorvastatin (LIPITOR) 80 MG tablet Take 80 mg by mouth daily.   Yes Historical Provider, MD  calcitRIOL (ROCALTROL) 0.25 MCG capsule Take 0.25 mcg by mouth daily.   Yes Historical Provider, MD  clopidogrel (PLAVIX) 75 MG tablet Take 75 mg by mouth daily.   Yes Historical Provider, MD  esomeprazole (NEXIUM) 40 MG capsule Take 40 mg by mouth daily before breakfast.     Yes Historical Provider, MD  fluticasone (FLOVENT HFA) 44 MCG/ACT inhaler Inhale 1 puff into the lungs 2 (two) times daily.   Yes Historical Provider, MD  furosemide (LASIX) 40 MG tablet Take 40 mg by mouth daily as needed. For fluid. 02/22/11  Yes [name], MD  isosorbide mononitrate (IMDUR) 30 MG 24 hr tablet Take 30 mg by mouth daily.   Yes Historical Provider, MD  metoprolol (LOPRESSOR) 100 MG tablet Take 100 mg by mouth 2 (two)  times daily.   Yes Historical Provider, MD  niacin (NIASPAN) 500 MG CR tablet Take 1,000 mg by mouth at bedtime.    Yes Historical Provider, MD  NIFEdipine (PROCARDIA) 10 MG capsule Take 10 mg by mouth 3 (three) times daily.   Yes Historical Provider, MD  traMADol (ULTRAM) 50 MG tablet Take 1 tablet by mouth Every 6 hours as needed. For pain 05/17/11  Yes Historical Provider, MD  dexlansoprazole (DEXILANT) 60 MG capsule Take 60 mg by mouth daily.    Historical Provider, MD  nitroGLYCERIN (NITROSTAT) 0.4 MG SL tablet Place 0.4 mg under the tongue every 5 (five) minutes as needed. For chest pains    Historical Provider, MD  oxyCODONE-acetaminophen (TYLOX) 5-500 MG per capsule Take 1 tablet by mouth Every  6 hours as needed. For pain 05/02/11   Historical Provider, MD    Inpatient Medications:     . allopurinol  100 mg Oral Daily  . atorvastatin  80 mg Oral Daily  . calcitRIOL  0.25 mcg Oral Daily  . darbepoetin (ARANESP) injection - DIALYSIS  60 mcg Subcutaneous Weekly  . esomeprazole  40 mg Oral Daily  . fluticasone  1 puff Inhalation BID  . furosemide  40 mg Oral Daily  . isosorbide mononitrate  30 mg Oral Daily  . metoprolol  25 mg Oral BID  . niacin  1,000 mg Oral QHS  . NIFEdipine  10 mg Oral TID  . phytonadione  5 mg Oral Once  . sodium bicarbonate  1,300 mg Oral TID  . warfarin   Does not apply Once  . DISCONTD: furosemide  40 mg Intravenous Once   Prescriptions prior to admission  Medication Sig Dispense Refill  . allopurinol (ZYLOPRIM) 300 MG tablet Take 300 mg by mouth daily.      [name] aspirin 81 MG tablet Take 1 tablet (81 mg total) by mouth daily.      [name] atorvastatin (LIPITOR) 80 MG tablet Take 80 mg by mouth daily.      . calcitRIOL (ROCALTROL) 0.25 MCG capsule Take 0.25 mcg by mouth daily.      . clopidogrel (PLAVIX) 75 MG tablet Take 75 mg by mouth daily.      [name] esomeprazole (NEXIUM) 40 MG capsule Take 40 mg by mouth daily before breakfast.        . fluticasone (FLOVENT  HFA) 44 MCG/ACT inhaler Inhale 1 puff into the lungs 2 (two) times daily.      . furosemide (LASIX) 40 MG tablet Take 40 mg by mouth daily as needed. For fluid.      . isosorbide mononitrate (IMDUR) 30 MG 24 hr tablet Take 30 mg by mouth daily.      . metoprolol (LOPRESSOR) 100 MG tablet Take 100 mg by mouth 2 (two) times daily.      . niacin (NIASPAN) 500 MG CR tablet Take 1,000 mg by mouth at bedtime.       [name] NIFEdipine (PROCARDIA) 10 MG capsule Take 10 mg by mouth 3 (three) times daily.      . traMADol (ULTRAM) 50 MG tablet Take 1 tablet by mouth Every 6 hours as needed. For pain      . dexlansoprazole (DEXILANT) 60 MG capsule Take 60 mg by mouth daily.      . nitroGLYCERIN (NITROSTAT) 0.4 MG SL tablet Place 0.4 mg under the tongue every 5 (five) minutes as needed. For chest pains      . oxyCODONE-acetaminophen (TYLOX) 5-500 MG per capsule Take 1 tablet by mouth Every 6 hours as needed. For pain        Family History  Problem Relation Age of Onset  . Kidney failure Mother   . Lupus Mother   . Stroke Mother   . Diabetes Mother     type 2  . Heart attack Father     X 7  . Hypertension Father   . Hyperlipidemia Father   . Heart attack Sister 33    X 1  . Hyperlipidemia Sister   . Hypertension Sister   . Heart disease Sister   . Heart attack Paternal Grandfather   . Heart disease Paternal Aunt   . Heart disease Paternal Uncle      History   Social History  . Marital  Status: Married    Spouse Name: N/A    Number of Children: N/A  . Years of Education: N/A   Occupational History  . Not on file.   Social History Main Topics  . Smoking status: Former Smoker -- 0.5 packs/day for 30 years    Types: Cigarettes    Quit date: 08/02/2010  . Smokeless tobacco: Never Used  . Alcohol Use: No  . Drug Use: No  . Sexually Active: Yes -- Male partner(s)   Other Topics Concern  . Not on file   Social History Narrative   Married, 45 y/o daught, 80 y/o son. 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     Review of Systems: General: negative for chills, fever, night sweats or weight changes.  Cardiovascular: negative for chest pain, positive for presyncope on exertion, dyspnea on exertion, edema, orthopnea, palpitations, paroxysmal nocturnal dyspnea or shortness of breath Dermatological: negative for rash Respiratory: positive for cough and wheezing Urologic: negative for hematuria Abdominal: negative for nausea, vomiting, diarrhea, bright red blood per rectum, melena, or hematemesis Neurologic: negative for visual changes, syncope, or dizziness All other systems reviewed and are otherwise negative except as noted above.  Physical Exam: Blood pressure 108/60, pulse 102, temperature 98 F (36.7 C), temperature source Oral, resp. rate 18, height 5' 6.93" (1.7 m), weight 91.9 kg (202 lb 9.6 oz), SpO2 99.00%.    General: Frail, appears older than stated age, in no acute distress. Head: Normocephalic, atraumatic, sclera non-icteric, no xanthomas, nares are without discharge. Neck: Negative for carotid bruits. JVD not elevated. Lungs: Increased respiratory effort with associated tachypnea noted on minimal exertion. Trace bibasilar rales appreciated. No wheezes or rales appreciated.  Heart:  Tachycardic. No murmurs, rubs, or gallops appreciated. Abdomen: Soft, non-tender, non-distended with normoactive bowel sounds. No hepatomegaly. No rebound/guarding. No obvious abdominal masses. Msk:  Strength and tone appears decreased for age. Extremities: Notable nonpitting RLE edema. No clubbing, cyanosis or edema otherwise.  Distal pedal pulses are 2+ and equal bilaterally. Neuro: Alert and oriented X 3. Moves all extremities spontaneously. Psych:  Responds to questions appropriately with a normal affect.  Labs: Recent Labs  Basename 07/13/11 0645 07/12/11 0600   WBC 7.3 8.5   HGB 7.3* 7.9*   HCT 21.3* 23.1*   MCV  94.7 97.5   PLT 115* 112*    Basename 07/11/11 1551  VITAMINB12 --  FOLATE --  FERRITIN 132  TIBC 154*  IRON 10*  RETICCTPCT --    Lab 07/13/11 0645 07/12/11 0600 07/11/11 0945  NA 134* 131* 130*  K 4.6 5.3* 5.1  CL 104 103 102  CO2 18* 15* 17*  BUN 63* 60* 56*  CREATININE 6.79* 6.71* 6.24*  CALCIUM 8.9 9.1 8.5  PROT -- -- --  BILITOT -- -- --  ALKPHOS -- -- --  ALT -- -- --  AST -- -- --  AMYLASE -- -- --  LIPASE -- -- --  GLUCOSE 121* 114* 120*   Radiology/Studies: Dg Pelvis Portable  07/09/2011  *RADIOLOGY REPORT*  Clinical Data: Hip replacement.  PORTABLE PELVIS  Comparison: Plain film 02/23/2009.  Findings: The patient has a new right total hip replacement.  The device is located.  No fracture is identified.  Gas in the soft tissues is noted.  Old left hip replacement is noted.  IMPRESSION: New right total hip without evidence of complication.  Original Report Authenticated By: Arvid Right. D'ALESSIO, M.D.   Dg Hip Portable 1  View Right  07/09/2011  *RADIOLOGY REPORT*  Clinical Data: Hip replacement.  PORTABLE RIGHT HIP - 1 VIEW  Comparison: None.  Findings: Right total hip replacement is in place.  Gas is seen in the soft tissues.  The hip appears located.  No fracture is identified.  IMPRESSION: Right hip replacement without evidence of complication.  Original Report Authenticated By: Arvid Right. D'ALESSIO, M.D.   EKG: no tracings  ASSESSMENT AND PLAN:   1. Ischemic cardiomyopathy- patient is very aware and proactive regarding his fluid and respiratory status in the outpatient setting. He takes Lasix PRN for weight gain, increased dyspnea with productive cough, and increased LE edema. He states that these symptoms have worsened over the past 2-3 months. He reports baseline DOE, orthopnea and PND which have worsened as well. He denies chest pain during or prior to this period. His optimization of medical management is complicated by his CKD and hypotension. Additionally, this  may likely be driven by worsening renal failure over worsening ICM (see below).. Would not favor digoxin or spironolactone in the setting of worsening renal failure and hyperkalemia yesterday.   - On Lasix, Lopressor and Imdur currently; scheduled for HD which may provide the most benefit at this point  - Will order EKG   - Will order CXR  - ICD interrogation- will call Medtronic rep  2. Acute on CKD- management per renal team. His renal function has declined over the past year (Cr 3.24 on 07/12 --> 5.29 on admission to 6.79 this AM), and his decreased excretion may certainly contribute to fluid overload, worsening dyspnea and heart failure symptoms. SBP was found to be in 90s yesterday, IVF and KCl stopped, metoprolol decreased per renal due to hypotension. Lasix was continued with the plan of possible HD if renal function worsens. HD has been subsequently decided on and scheduled per renal.   3. Sinus tachycardia- patient is in the post-op setting, has anemia requiring transfusion today, and was hypotensive yesterday. Patient states he had been tachycardic prior to this admission which may more likely be attributed to anemia and hypotension. PE is certainly in the differential (patient was off Coumadin, sedentary at home due to orthopedic issues, increased dyspnea). He was bridged with heparin post-op this admission and re-started on Coumadin. This was held due to supratherapeutic INR, now therapeutic after vitamin K administration. This may be multifactorial including post-op inflammatory state, secondary to hypotension and anemia. No PE work-up thus far; however, patient is anticoagulated. Additionally, patient is afebrile without a leukocytosis not supporting an infectious process.    - Continue Coumadin anticoagulation per primary team    4. Anemia- H/H at 7.3/21.3 this morning. Patient transfused this AM after CBC results.   5. Hyperlipidemia  - Continue statin, niacin  6. Hypotension-  improved today; medications adjusted per renal; transfused   - Continue to monitor   Signed, R. Valeria Batman, PA-C 07/13/2011, 10:20 AM  Patient seen and examined, and evaluated with Dr. Arty Baumgartner.  He has long standing SLE with evidence of an ischemic cardiomyopathy, and volume overload.  He has moderate shortness of breath coupled with declining renal function, and under utilization of diuretics in part designed to protect his residual kidney function.  He underwent surgery, and this is associated with a significant anemia.  He also has sinus tachycardia, but of note his beta blockers have been held because of hypotension.    Exam is remarkable for some soft crackles at left base. BP is about [id number] systolic, P  is 100.Marval Regal and I examined him at the same time.  ECG is pending.    1.  He will be getting transfusion associated with dialysis as renal thinks this is the best way at present to deal with volume---I agree. 2.  Transfusion should help his BP as well as when volume is off. 3.  ICD interogation is appropriate. 4.  Check 12 lead ECG which is not done. 5.  Reassess beta blockers after dialysis. 6.  We will follow with you.  Bing Quarry 3:14 PM'07/13/2011    .

## 2011-07-14 DIAGNOSIS — Z95810 Presence of automatic (implantable) cardiac defibrillator: Secondary | ICD-10-CM

## 2011-07-14 LAB — CBC
HCT: 25.7 % — ABNORMAL LOW (ref 39.0–52.0)
MCH: 31.5 pg (ref 26.0–34.0)
MCHC: 35 g/dL (ref 30.0–36.0)
RDW: 18 % — ABNORMAL HIGH (ref 11.5–15.5)

## 2011-07-14 LAB — RENAL FUNCTION PANEL
Albumin: 1.9 g/dL — ABNORMAL LOW (ref 3.5–5.2)
BUN: 47 mg/dL — ABNORMAL HIGH (ref 6–23)
Calcium: 8.6 mg/dL (ref 8.4–10.5)
Creatinine, Ser: 5.61 mg/dL — ABNORMAL HIGH (ref 0.50–1.35)
Glucose, Bld: 123 mg/dL — ABNORMAL HIGH (ref 70–99)
Phosphorus: 4 mg/dL (ref 2.3–4.6)
Potassium: 4.2 mEq/L (ref 3.5–5.1)

## 2011-07-14 LAB — TYPE AND SCREEN
Unit division: 0
Unit division: 0

## 2011-07-14 LAB — PROTIME-INR: INR: 1.63 — ABNORMAL HIGH (ref 0.00–1.49)

## 2011-07-14 MED ORDER — FUROSEMIDE 8 MG/ML PO SOLN
40.0000 mg | Freq: Two times a day (BID) | ORAL | Status: DC
  Administered 2011-07-14 – 2011-07-18 (×6): 40 mg via ORAL
  Filled 2011-07-14 (×10): qty 5

## 2011-07-14 MED ORDER — WARFARIN SODIUM 5 MG PO TABS
5.0000 mg | ORAL_TABLET | Freq: Once | ORAL | Status: DC
  Filled 2011-07-14: qty 1

## 2011-07-14 NOTE — Progress Notes (Signed)
Patient ID: Roy Dyer, male   DOB: 10/28/1965, 46 y.o.   MRN: [id number] SUBJECTIVE: Still SOB sitting in chair this am. Transfused and Hgb increased this am. -999.5 yesterday but he tells me dialysis was cut short because of infiltration of LUE AV fistula.  Filed Vitals:   07/14/11 0050 07/14/11 0144 07/14/11 0227 07/14/11 0500  BP: 129/83 134/81 104/67 117/71  Pulse: 100 107 103 114  Temp: 97.8 F (36.6 C) 98.7 F (37.1 C) 97.7 F (36.5 C) 97.2 F (36.2 C)  TempSrc: Oral Oral Oral   Resp: 18 20 20 20   Height:      Weight:      SpO2:    99%    Intake/Output Summary (Last 24 hours) at 07/14/11 0947 Last data filed at 07/14/11 0227  Gross per 24 hour  Intake  850.5 ml  Output   1850 ml  Net -999.5 ml    LABS: Basic Metabolic Panel:  Basename 07/13/11 0645 07/12/11 0600  NA 134* 131*  K 4.6 5.3*  CL 104 103  CO2 18* 15*  GLUCOSE 121* 114*  BUN 63* 60*  CREATININE 6.79* 6.71*  CALCIUM 8.9 9.1  MG -- --  PHOS 5.6* 5.1*   Liver Function Tests:  Basename 07/13/11 0645 07/12/11 0600  AST -- --  ALT -- --  ALKPHOS -- --  BILITOT -- --  PROT -- --  ALBUMIN 1.9* 2.1*   No results found for this basename: LIPASE:2,AMYLASE:2 in the last 72 hours CBC:  Basename 07/14/11 0805 07/13/11 0645  WBC 7.9 7.3  NEUTROABS -- --  HGB 9.0* 7.3*  HCT 25.7* 21.3*  MCV 89.9 94.7  PLT 92* 115*   Cardiac Enzymes: No results found for this basename: CKTOTAL:3,CKMB:3,CKMBINDEX:3,TROPONINI:3 in the last 72 hours BNP: No components found with this basename: POCBNP:3 D-Dimer: No results found for this basename: DDIMER:2 in the last 72 hours Hemoglobin A1C: No results found for this basename: HGBA1C in the last 72 hours Fasting Lipid Panel: No results found for this basename: CHOL,HDL,LDLCALC,TRIG,CHOLHDL,LDLDIRECT in the last 72 hours Thyroid Function Tests: No results found for this basename: TSH,T4TOTAL,FREET3,T3FREE,THYROIDAB in the last 72 hours Anemia Panel:  Basename  07/11/11 1551  VITAMINB12 --  FOLATE --  FERRITIN 132  TIBC 154*  IRON 10*  RETICCTPCT --    RADIOLOGY: Dg Chest 2 View  07/13/2011  *RADIOLOGY REPORT*  Clinical Data: Shortness of breath, cough.  Hypertension.  History of smoking.  CHEST - 2 VIEW  Comparison: 03/28/2011  Findings: Right-sided pacemaker / AICD lead overlies right ventricle.  Heart is enlarged.  No edema.  No focal consolidations or pleural effusions.  Mild degenerative changes are seen in the spine.  IMPRESSION: Cardiomegaly without pulmonary edema.  Original Report Authenticated By: Glenice Bow, M.D.   Dg Pelvis Portable  07/09/2011  *RADIOLOGY REPORT*  Clinical Data: Hip replacement.  PORTABLE PELVIS  Comparison: Plain film 02/23/2009.  Findings: The patient has a new right total hip replacement.  The device is located.  No fracture is identified.  Gas in the soft tissues is noted.  Old left hip replacement is noted.  IMPRESSION: New right total hip without evidence of complication.  Original Report Authenticated By: Arvid Right. D'ALESSIO, M.D.   Dg Hip Portable 1 View Right  07/09/2011  *RADIOLOGY REPORT*  Clinical Data: Hip replacement.  PORTABLE RIGHT HIP - 1 VIEW  Comparison: None.  Findings: Right total hip replacement is in place.  Gas is seen in the soft  tissues.  The hip appears located.  No fracture is identified.  IMPRESSION: Right hip replacement without evidence of complication.  Original Report Authenticated By: Arvid Right. Luther Parody, M.D.    PHYSICAL EXAM General: Well developed, well nourished, in no acute distress, dyspneic Head: Eyes PERRLA,   Normal cephalic and atramatic  Lungs: Clear bilaterally to auscultation and percussion. Heart: HRRR S1 S2, with soft S4 murmur.  Pulses are 2+ & equal.            No carotid bruit. No JVD.  No abdominal bruits. No femoral bruits. Abdomen: Bowel sounds are positive, abdomen soft and non-tender without masses or                  Hernia's noted. Msk:  Back normal,  normal gait. Normal strength and tone for age. Extremities: No clubbing, cyanosis, 1-2+ edema, RLE>LLE.  DP +1 Neuro: Alert and oriented X 3. Psych:  Good affect, responds appropriately  TELEMETRY: Reviewed telemetry pt in NSR EKG 3/1 shows NSR with new first degree AVB  ASSESSMENT AND PLAN:  He is still SOB as outlined in subjective section. RN tells me he is for dialysis today. Hgb improved after transfusion. Will defer volume management to Renal.   Jenell Milliner, MD 07/14/2011 9:47 AM

## 2011-07-14 NOTE — Progress Notes (Signed)
Subjective: 5 Days Post-Op Procedure(s) (LRB): TOTAL HIP ARTHROPLASTY (Right) Patient reports pain as minimal in hip. Mild SOB but improved from yest. Up in chair.  Objective: Vital signs in last 24 hours: Temp:  [97.1 F (36.2 C)-99 F (37.2 C)] 98.4 F (36.9 C) (03/02 1400) Pulse Rate:  [60-124] 103  (03/02 1400) Resp:  [16-20] 20  (03/02 1400) BP: (96-134)/(58-83) 98/58 mmHg (03/02 1400) SpO2:  [96 %-99 %] 96 % (03/02 1400) Weight:  [96.2 kg (212 lb 1.3 oz)] 96.2 kg (212 lb 1.3 oz) (03/01 1810)  Intake/Output from previous day: 03/01 0701 - 03/02 0700 In: 850.5 [I.V.:300; Blood:262.5] Out: 1850 [Urine:1020] Intake/Output this shift: Total I/O In: 720 [P.O.:720] Out: 700 [Urine:700]   Basename 07/14/11 0805 07/13/11 0645 07/12/11 0600  HGB 9.0* 7.3* 7.9*    Basename 07/14/11 0805 07/13/11 0645  WBC 7.9 7.3  RBC 2.86* 2.25*  HCT 25.7* 21.3*  PLT 92* 115*    Basename 07/14/11 1157 07/13/11 0645  NA 136 134*  K 4.2 4.6  CL 100 104  CO2 25 18*  BUN 47* 63*  CREATININE 5.61* 6.79*  GLUCOSE 123* 121*  CALCIUM 8.6 8.9    Basename 07/14/11 0600 07/13/11 0645  LABPT -- --  INR 1.63* 2.12*  Right hip exam:  Neurovascular intact Sensation intact distally Intact pulses distally Dorsiflexion/Plantar flexion intact Incision: dressing C/D/I  Assessment/Plan: 5 Days Post-Op Procedure(s) (LRB): TOTAL HIP ARTHROPLASTY (Right) Additional dx: Acute blood loss anemia improved Chronic renal insufficiency- followed by renal service. PLAN: Renal tx per renal service Cont PT  Mobilize pt  WBAT on right Stop coumadin per Dr Mayer Camel. Use SCD's   Encouraged their use to pt and family.  Shar Paez G 07/14/2011, 2:53 PM

## 2011-07-14 NOTE — Progress Notes (Signed)
Patient ID: Roy Dyer, male   DOB: 08-29-1965, 46 y.o.   MRN: [id number] S:feels weak and sob but better than yesterday O:BP 117/71  Pulse 114  Temp(Src) 97.2 F (36.2 C) (Oral)  Resp 20  Ht 5' 6.93" (1.7 m)  Wt 96.2 kg (212 lb 1.3 oz)  BMI 33.29 kg/m2  SpO2 99%  Intake/Output Summary (Last 24 hours) at 07/14/11 1055 Last data filed at 07/14/11 0227  Gross per 24 hour  Intake  850.5 ml  Output   1850 ml  Net -999.5 ml   Weight change: 4.3 kg (9 lb 7.7 oz) [id number], in nad [id number] Resp:CTA [id number] Ext:1+edema bl, LUE AVF +T/B with some ecchymosis   Lab 07/13/11 0645 07/12/11 0600 07/11/11 0945 07/10/11 0528  NA 134* 131* 130* 132*  K 4.6 5.3* 5.1 5.0  CL 104 103 102 103  CO2 18* 15* 17* 20  GLUCOSE 121* 114* 120* 138*  BUN 63* 60* 56* 53*  CREATININE 6.79* 6.71* 6.24* 5.91*  ALB -- -- -- --  CALCIUM 8.9 9.1 8.5 8.6  PHOS 5.6* 5.1* -- --  AST -- -- -- --  ALT -- -- -- --   Liver Function Tests:  Lab 07/13/11 0645 07/12/11 0600  AST -- --  ALT -- --  ALKPHOS -- --  BILITOT -- --  PROT -- --  ALBUMIN 1.9* 2.1*   No results found for this basename: LIPASE:3,AMYLASE:3 in the last 168 hours No results found for this basename: AMMONIA:3 in the last 168 hours CBC:  Lab 07/14/11 0805 07/13/11 0645 07/12/11 0600 07/11/11 0945 07/10/11 0528  WBC 7.9 7.3 8.5 -- --  NEUTROABS -- -- -- -- --  HGB 9.0* 7.3* 7.9* -- --  HCT 25.7* 21.3* 23.1* -- --  MCV 89.9 94.7 97.5 95.5 97.1  PLT 92* 115* 112* -- --   Cardiac Enzymes: No results found for this basename: CKTOTAL:5,CKMB:5,CKMBINDEX:5,TROPONINI:5 in the last 168 hours CBG: No results found for this basename: GLUCAP:5 in the last 168 hours  Iron Studies:  Basename 07/11/11 1551  IRON 10*  TIBC 154*  TRANSFERRIN --  FERRITIN 132   Studies/Results: Dg Chest 2 View  07/13/2011  *RADIOLOGY REPORT*  Clinical Data: Shortness of breath, cough.  Hypertension.  History of smoking.  CHEST - 2 VIEW  Comparison:  03/28/2011  Findings: Right-sided pacemaker / AICD lead overlies right ventricle.  Heart is enlarged.  No edema.  No focal consolidations or pleural effusions.  Mild degenerative changes are seen in the spine.  IMPRESSION: Cardiomegaly without pulmonary edema.  Original Report Authenticated By: Glenice Bow, M.D.      . allopurinol  100 mg Oral Daily  . atorvastatin  80 mg Oral Daily  . calcitRIOL  0.25 mcg Oral Daily  . darbepoetin (ARANESP) injection - DIALYSIS  60 mcg Subcutaneous Weekly  . esomeprazole  40 mg Oral Daily  . fluticasone  1 puff Inhalation BID  . furosemide      . furosemide  40 mg Oral Daily  . isosorbide mononitrate  30 mg Oral Daily  . metoprolol  25 mg Oral BID  . niacin  1,000 mg Oral QHS  . NIFEdipine  10 mg Oral TID  . sodium bicarbonate  1,300 mg Oral TID  . warfarin  2.5 mg Oral ONCE-1800  . warfarin   Does not apply Once  . Warfarin - Pharmacist Dosing Inpatient   Does not apply q1800    BMET    Component Value  Date/Time   NA 134* 07/13/2011 0645   K 4.6 07/13/2011 0645   CL 104 07/13/2011 0645   CO2 18* 07/13/2011 0645   GLUCOSE 121* 07/13/2011 0645   BUN 63* 07/13/2011 0645   CREATININE 6.79* 07/13/2011 0645   CALCIUM 8.9 07/13/2011 0645   CALCIUM 9.1 04/23/2011 0820   GFRNONAA 9* 07/13/2011 0645   GFRAA 10* 07/13/2011 0645   CBC    Component Value Date/Time   WBC 7.9 07/14/2011 0805   RBC 2.86* 07/14/2011 0805   HGB 9.0* 07/14/2011 0805   HCT 25.7* 07/14/2011 0805   PLT 92* 07/14/2011 0805   MCV 89.9 07/14/2011 0805   MCH 31.5 07/14/2011 0805   MCHC 35.0 07/14/2011 0805   RDW 18.0* 07/14/2011 0805   LYMPHSABS 2.1 07/02/2011 1129   MONOABS 0.6 07/02/2011 1129   EOSABS 0.4 07/02/2011 1129   BASOSABS 0.1 07/02/2011 1129     Assessment/Plan:  1. 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  Awaiting labs.  C/o sob but exam is better than yesterday.  Will hold off on HD for now and let AVF rest.  Re-eval later today and again tomorrow 2. CHF-  as above, will need UF with HD as BP tolerates 3. Hyperkalemia- as above 4. Metabolic acidosis- labs Pending 5. AVN s/p RORIF- pain control and PT/OT  6. ABLA/anemia of chronic disease- will check iron stores, start epo and transfuse if hgb continues to fall and/or he develops ischemic symptoms. Agree with transfusion given Hgb <8 in CAD  7. CAD- currently stable. On plavix, NTG, Betablocker  8. HTN- better. Decrease metoprolol  9. Gout decrease allopurinol to 100mg  qd SLE- stable  10. Raynaud's- cont with nifedipine 11. Vasc access- small infiltration yesterday, +T/B 12.  Hewitt A

## 2011-07-14 NOTE — Progress Notes (Addendum)
ANTICOAGULATION CONSULT NOTE - Follow Up Consult  Pharmacy Consult for Coumadin Indication: VTE prophylaxis  No Known Allergies  Patient Measurements: Height: 5' 6.93" (170 cm) Weight: 212 lb 1.3 oz (96.2 kg) IBW/kg (Calculated) : 65.94   Vital Signs: Temp: 97.2 F (36.2 C) (03/02 0500) Temp src: Oral (03/02 0227) BP: 117/71 mmHg (03/02 0500) Pulse Rate: 114  (03/02 0500)  Labs:  Basename 07/14/11 0805 07/14/11 0600 07/13/11 0645 07/12/11 0600  HGB 9.0* -- 7.3* --  HCT 25.7* -- 21.3* 23.1*  PLT 92* -- 115* 112*  APTT -- -- -- --  LABPROT -- 19.6* 24.1* 53.2*  INR -- 1.63* 2.12* 5.84*  HEPARINUNFRC -- -- -- --  CREATININE -- -- 6.79* 6.71*  CKTOTAL -- -- -- --  CKMB -- -- -- --  TROPONINI -- -- -- --   Estimated Creatinine Clearance: 15.3 ml/min (by C-G formula based on Cr of 6.79).   Medications:  Scheduled:     . allopurinol  100 mg Oral Daily  . atorvastatin  80 mg Oral Daily  . calcitRIOL  0.25 mcg Oral Daily  . darbepoetin (ARANESP) injection - DIALYSIS  60 mcg Subcutaneous Weekly  . esomeprazole  40 mg Oral Daily  . fluticasone  1 puff Inhalation BID  . furosemide      . furosemide  40 mg Oral BID  . isosorbide mononitrate  30 mg Oral Daily  . metoprolol  25 mg Oral BID  . niacin  1,000 mg Oral QHS  . NIFEdipine  10 mg Oral TID  . sodium bicarbonate  1,300 mg Oral TID  . warfarin  2.5 mg Oral ONCE-1800  . warfarin   Does not apply Once  . Warfarin - Pharmacist Dosing Inpatient   Does not apply q1800  . DISCONTD: furosemide  40 mg Oral Daily   Assessment: 46 y/o male patient receiving coumadin for dvt px. INR became supratherapeutic after 1 dose of 10mg  then 5mg . Following day received vitamin K to reverse, today INR 1.63 therapeutic Goal of Therapy:  INR  1.5-2   Plan:  Coumadin 5 mg today and f/u in am.  Davonna Belling, PharmD, BCPS Pager [phone] 07/14/2011,12:26 PM

## 2011-07-14 NOTE — Progress Notes (Signed)
Physical Therapy Treatment Patient Details Name: Roy Dyer MRN: [id number] DOB: 1965/06/10 Today's Date: 07/14/2011  PT Assessment/Plan  PT - Assessment/Plan Comments on Treatment Session: Again limited by SOB. Agreeable only to ther-ex. Plan for HD this pm. Hopeful this will help with SOB. Stated 3/3 post hip precautions. HEP sheet provided.  PT Plan: Discharge plan remains appropriate;Frequency remains appropriate Follow Up Recommendations: Home health PT;Supervision/Assistance - 24 hour Equipment Recommended: None recommended by PT PT Goals  Acute Rehab PT Goals PT Goal: Sit to Stand - Progress: Met PT Goal: Stand to Sit - Progress: Met PT Goal: Ambulate - Progress: Not progressing PT Goal: Up/Down Stairs - Progress: Not progressing PT Goal: Perform Home Exercise Program - Progress: Progressing toward goal Additional Goals PT Goal: Additional Goal #1 - Progress: Met  PT Treatment Precautions/Restrictions  Precautions Precautions: Posterior Hip Precaution Booklet Issued: Yes (comment) Precaution Comments: Pt able to recall 3/3 Post Hip Prec Restrictions Weight Bearing Restrictions: Yes RLE Weight Bearing: Weight bearing as tolerated Mobility (including Balance) Transfers Sit to Stand: 6: Modified independent (Device/Increase time);With upper extremity assist Stand to Sit: 6: Modified independent (Device/Increase time);With upper extremity assist Ambulation/Gait Ambulation/Gait Assistance:  (pt declined amb 2/2 SOB)    Exercise  Total Joint Exercises Ankle Circles/Pumps: AROM;20 reps;Seated Gluteal Sets: AROM;Seated;Both (10 x3) Towel Squeeze: AROM;Both (10x3) Hip ABduction/ADduction: AROM;Both (10x3) Long Arc Quad: AAROM;Both (10 x3) Knee Flexion: AROM;Right;10 reps;Standing End of Session PT - End of Session Equipment Utilized During Treatment: Gait belt Activity Tolerance: Patient limited by fatigue;Treatment limited secondary to medical complications (Comment)  (SOB, HR 100s, general depressed affect) Patient left: in chair Nurse Communication: Mobility status for transfers General Behavior During Session: Flat affect (very agreeable but obviously depressed) Cognition: WFL for tasks performed  [place] 07/14/2011, 1:19 PM

## 2011-07-15 ENCOUNTER — Other Ambulatory Visit: Payer: Self-pay

## 2011-07-15 ENCOUNTER — Encounter: Payer: Self-pay | Admitting: Internal Medicine

## 2011-07-15 LAB — CBC
HCT: 23.1 % — ABNORMAL LOW (ref 39.0–52.0)
Hemoglobin: 8.1 g/dL — ABNORMAL LOW (ref 13.0–17.0)
MCH: 31.5 pg (ref 26.0–34.0)
MCHC: 35.1 g/dL (ref 30.0–36.0)
MCV: 89.9 fL (ref 78.0–100.0)
RBC: 2.57 MIL/uL — ABNORMAL LOW (ref 4.22–5.81)

## 2011-07-15 LAB — RENAL FUNCTION PANEL
BUN: 50 mg/dL — ABNORMAL HIGH (ref 6–23)
CO2: 25 mEq/L (ref 19–32)
Calcium: 8.9 mg/dL (ref 8.4–10.5)
Creatinine, Ser: 6.27 mg/dL — ABNORMAL HIGH (ref 0.50–1.35)
GFR calc Af Amer: 11 mL/min — ABNORMAL LOW (ref >90)
Glucose, Bld: 117 mg/dL — ABNORMAL HIGH (ref 70–99)
Phosphorus: 5.4 mg/dL — ABNORMAL HIGH (ref 2.3–4.6)

## 2011-07-15 LAB — PROTIME-INR: Prothrombin Time: 19.6 seconds — ABNORMAL HIGH (ref 11.6–15.2)

## 2011-07-15 MED ORDER — GABAPENTIN 100 MG PO CAPS
100.0000 mg | ORAL_CAPSULE | Freq: Every day | ORAL | Status: DC
  Administered 2011-07-15 – 2011-07-19 (×5): 100 mg via ORAL
  Filled 2011-07-15 (×6): qty 1

## 2011-07-15 NOTE — Progress Notes (Signed)
Physical Therapy Treatment Patient Details Name: Roy Dyer MRN: [id number] DOB: [birth year] Today's Date: 07/15/2011  Very good progress today with increased ambulation distance and decr shortness of breath  PT Assessment/Plan  PT - Assessment/Plan PT Plan: Discharge plan remains appropriate;Frequency remains appropriate PT Frequency: 7X/week Follow Up Recommendations: Home health PT;Supervision/Assistance - 24 hour Equipment Recommended: None recommended by PT PT Goals  Acute Rehab PT Goals Time For Goal Achievement: 7 days Pt will go Supine/Side to Sit: with modified independence PT Goal: Supine/Side to Sit - Progress: Updated due to goal met Pt will go Sit to Supine/Side: with supervision PT Goal: Sit to Supine/Side - Progress: Updated due to goal met Pt will go Sit to Stand: with modified independence PT Goal: Sit to Stand - Progress: Updated due to goal met Pt will go Stand to Sit: with modified independence PT Goal: Stand to Sit - Progress: Updated due to goals met Pt will Ambulate: >150 feet;with supervision;with rolling walker PT Goal: Ambulate - Progress: Progressing toward goal Pt will Go Up / Down Stairs: 3-5 stairs;with rail(s);with min assist PT Goal: Up/Down Stairs - Progress: Progressing toward goal Pt will Perform Home Exercise Program: with supervision, verbal cues required/provided PT Goal: Perform Home Exercise Program - Progress: Progressing toward goal Additional Goals Additional Goal #1: Pt will verbalize and adhere to posterior Hip Prec with mobility acts PT Goal: Additional Goal #1 - Progress: Progressing toward goal  PT Treatment Precautions/Restrictions  Precautions Precautions: Posterior Hip Precaution Booklet Issued: Yes (comment) Precaution Comments: Pt able to recall 3/3 Post Hip Prec Restrictions Weight Bearing Restrictions: Yes RLE Weight Bearing: Weight bearing as tolerated Mobility (including Balance) Bed Mobility Supine to Sit: 5:  Supervision Supine to Sit Details (indicate cue type and reason): monitor for post hip pre, esp not hip internal rotation Transfers Sit to Stand: With upper extremity assist;5: Supervision Sit to Stand Details (indicate cue type and reason): min cues for positioning for pOt hip prec Stand to Sit: 5: Supervision;With armrests Stand to Sit Details: very min cues for post hip prec Ambulation/Gait Ambulation/Gait Assistance: 5: Supervision Ambulation/Gait Assistance Details (indicate cue type and reason): cues for post hip prec with turns; much better distance today with less shortness of breath Ambulation Distance (Feet): 100 Feet (to/from rehab gym) Assistive device: Rolling walker Gait Pattern: Step-to pattern Stairs: Yes Stairs Assistance: 4: Min assist Stairs Assistance Details (indicate cue type and reason): cues for sequence Stair Management Technique: One rail Right;With cane Number of Stairs: 2        End of Session PT - End of Session Equipment Utilized During Treatment: Gait belt Activity Tolerance: Patient tolerated treatment well Patient left: in chair General Cognition: [facility] for tasks performed  Roney Marion [facility] [place], [place]  07/15/2011, 12:11 PM

## 2011-07-15 NOTE — Progress Notes (Signed)
Patient ID: KIMOTHY LEONHART, male   DOB: 1965-10-23, 46 y.o.   MRN: [id number] S:still weak and sob but better O:BP 100/62  Pulse 108  Temp(Src) 97.8 F (36.6 C) (Oral)  Resp 20  Ht 5' 6.93" (1.7 m)  Wt 96.2 kg (212 lb 1.3 oz)  BMI 33.29 kg/m2  SpO2 95%  Intake/Output Summary (Last 24 hours) at 07/15/11 0917 Last data filed at 07/15/11 0500  Gross per 24 hour  Intake    720 ml  Output   1750 ml  Net  -1030 ml   Weight change:  Gen:WD WN WM in NAD CVS:rrr no rub Resp:cta [id number] pretib edema   Lab 07/15/11 0555 07/14/11 1157 07/13/11 0645 07/12/11 0600 07/11/11 0945 07/10/11 0528  NA 136 136 134* 131* 130* 132*  K 4.2 4.2 4.6 5.3* 5.1 5.0  CL 100 100 104 103 102 103  CO2 25 25 18* 15* 17* 20  GLUCOSE 117* 123* 121* 114* 120* 138*  BUN 50* 47* 63* 60* 56* 53*  CREATININE 6.27* 5.61* 6.79* 6.71* 6.24* 5.91*  ALB -- -- -- -- -- --  CALCIUM 8.9 8.6 8.9 9.1 8.5 8.6  PHOS 5.4* 4.0 5.6* 5.1* -- --  AST -- -- -- -- -- --  ALT -- -- -- -- -- --   Liver Function Tests:  Lab 07/15/11 0555 07/14/11 1157 07/13/11 0645  AST -- -- --  ALT -- -- --  ALKPHOS -- -- --  BILITOT -- -- --  PROT -- -- --  ALBUMIN 2.0* 1.9* 1.9*   No results found for this basename: LIPASE:3,AMYLASE:3 in the last 168 hours No results found for this basename: AMMONIA:3 in the last 168 hours CBC:  Lab 07/15/11 0555 07/14/11 0805 07/13/11 0645 07/12/11 0600 07/11/11 0945  WBC 6.8 7.9 7.3 -- --  NEUTROABS -- -- -- -- --  HGB 8.1* 9.0* 7.3* -- --  HCT 23.1* 25.7* 21.3* -- --  MCV 89.9 89.9 94.7 97.5 95.5  PLT 77* 92* 115* -- --   Cardiac Enzymes: No results found for this basename: CKTOTAL:5,CKMB:5,CKMBINDEX:5,TROPONINI:5 in the last 168 hours CBG: No results found for this basename: GLUCAP:5 in the last 168 hours  Iron Studies: No results found for this basename: IRON,TIBC,TRANSFERRIN,FERRITIN in the last 72 hours Studies/Results: Dg Chest 2 View  07/13/2011  *RADIOLOGY REPORT*   Clinical Data: Shortness of breath, cough.  Hypertension.  History of smoking.  CHEST - 2 VIEW  Comparison: 03/28/2011  Findings: Right-sided pacemaker / AICD lead overlies right ventricle.  Heart is enlarged.  No edema.  No focal consolidations or pleural effusions.  Mild degenerative changes are seen in the spine.  IMPRESSION: Cardiomegaly without pulmonary edema.  Original Report Authenticated By: Glenice Bow, M.D.      . allopurinol  100 mg Oral Daily  . atorvastatin  80 mg Oral Daily  . calcitRIOL  0.25 mcg Oral Daily  . darbepoetin (ARANESP) injection - DIALYSIS  60 mcg Subcutaneous Weekly  . esomeprazole  40 mg Oral Daily  . fluticasone  1 puff Inhalation BID  . furosemide  40 mg Oral BID  . isosorbide mononitrate  30 mg Oral Daily  . metoprolol  25 mg Oral BID  . niacin  1,000 mg Oral QHS  . NIFEdipine  10 mg Oral TID  . sodium bicarbonate  1,300 mg Oral TID  . DISCONTD: furosemide  40 mg Oral Daily  . DISCONTD: warfarin  5 mg Oral ONCE-1800  . DISCONTD:  warfarin   Does not apply Once  . DISCONTD: Warfarin - Pharmacist Dosing Inpatient   Does not apply q1800    BMET    Component Value Date/Time   NA 136 07/15/2011 0555   K 4.2 07/15/2011 0555   CL 100 07/15/2011 0555   CO2 25 07/15/2011 0555   GLUCOSE 117* 07/15/2011 0555   BUN 50* 07/15/2011 0555   CREATININE 6.27* 07/15/2011 0555   CALCIUM 8.9 07/15/2011 0555   CALCIUM 9.1 04/23/2011 0820   GFRNONAA 10* 07/15/2011 0555   GFRAA 11* 07/15/2011 0555   CBC    Component Value Date/Time   WBC 6.8 07/15/2011 0555   RBC 2.57* 07/15/2011 0555   HGB 8.1* 07/15/2011 0555   HCT 23.1* 07/15/2011 0555   PLT 77* 07/15/2011 0555   MCV 89.9 07/15/2011 0555   MCH 31.5 07/15/2011 0555   MCHC 35.1 07/15/2011 0555   RDW 17.5* 07/15/2011 0555   LYMPHSABS 2.1 07/02/2011 1129   MONOABS 0.6 07/02/2011 1129   EOSABS 0.4 07/02/2011 1129   BASOSABS 0.1 07/02/2011 1129     Assessment/Plan:  1. 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   C/o sob but exam is better than yesterday. Will hold off on HD for now and let AVF rest. Good UOP, will hold off on HD and follow daily 2. CHF- as above, continue with lasix 3. ABLA/anemia of chronic disease- will check iron stores, start epo and transfuse if hgb continues to fall and/or he develops ischemic symptoms. Agree with transfusion given Hgb <8 in CAD.  Continues to trend down ?source of continued blood loss 4. Hyperkalemia- as above 5. Metabolic acidosis- labs Pending 6. AVN s/p RORIF- pain control and PT/OT  7. CAD- currently stable. On plavix, NTG, Betablocker  8. HTN- better. Decrease metoprolol  9. Gout decrease allopurinol to 100mg  qd SLE- stable  10. Raynaud's- cont with nifedipine 11. Vasc access- small infiltration 07/13/11, +T/B 12. Numbness and tingling of 4th and 5th digits bilaterally  Laurell Coalson A

## 2011-07-15 NOTE — Progress Notes (Signed)
Patient ID: Roy Dyer, male   DOB: 01-16-1966, 46 y.o.   MRN: [id number] Chart reviewed. Stable cardiac status. Volume management per Dr Meredeth Ide.

## 2011-07-15 NOTE — Progress Notes (Signed)
Subjective: 6 Days Post-Op Procedure(s) (LRB): TOTAL HIP ARTHROPLASTY (Right) Patient reports pain as 3 on 0-10 scale.    Objective: Vital signs in last 24 hours: Temp:  [97.8 F (36.6 C)-98.7 F (37.1 C)] 97.8 F (36.6 C) (03/03 0500) Pulse Rate:  [101-108] 108  (03/03 0500) Resp:  [20] 20  (03/03 0500) BP: (98-110)/(58-64) 100/62 mmHg (03/03 0500) SpO2:  [95 %-98 %] 95 % (03/03 0850)  Intake/Output from previous day: 03/02 0701 - 03/03 0700 In: 960 [P.O.:960] Out: 1750 [Urine:1750] Intake/Output this shift:     Basename 07/15/11 0555 07/14/11 0805 07/13/11 0645  HGB 8.1* 9.0* 7.3*    Basename 07/15/11 0555 07/14/11 0805  WBC 6.8 7.9  RBC 2.57* 2.86*  HCT 23.1* 25.7*  PLT 77* 92*    Basename 07/15/11 0555 07/14/11 1157  NA 136 136  K 4.2 4.2  CL 100 100  CO2 25 25  BUN 50* 47*  CREATININE 6.27* 5.61*  GLUCOSE 117* 123*  CALCIUM 8.9 8.6    Basename 07/15/11 0555 07/14/11 0600  LABPT -- --  INR 1.63* 1.63*  Right hip exam:  Neurovascular intact Sensation intact distally Intact pulses distally Dorsiflexion/Plantar flexion intact Incision: dressing C/D/I Compartment soft  Assessment/Plan: 6 Days Post-Op Procedure(s) (LRB): TOTAL HIP ARTHROPLASTY (Right) Renal insufficiency   followed by renal service PLAN: Cont PT  SCD's /knee high TEDS Med tx per renal service. No coumadin.  Jing Howatt G 07/15/2011, 10:46 AM

## 2011-07-16 DIAGNOSIS — N186 End stage renal disease: Secondary | ICD-10-CM

## 2011-07-16 LAB — PROTIME-INR
INR: 2.28 — ABNORMAL HIGH (ref 0.00–1.49)
Prothrombin Time: 25.5 s — ABNORMAL HIGH (ref 11.6–15.2)

## 2011-07-16 LAB — CBC
HCT: 23.7 % — ABNORMAL LOW (ref 39.0–52.0)
Hemoglobin: 8.2 g/dL — ABNORMAL LOW (ref 13.0–17.0)
WBC: 7.8 10*3/uL (ref 4.0–10.5)

## 2011-07-16 LAB — RENAL FUNCTION PANEL
BUN: 57 mg/dL — ABNORMAL HIGH (ref 6–23)
Chloride: 96 mEq/L (ref 96–112)
Glucose, Bld: 111 mg/dL — ABNORMAL HIGH (ref 70–99)
Potassium: 3.9 mEq/L (ref 3.5–5.1)

## 2011-07-16 MED ORDER — DARBEPOETIN ALFA-POLYSORBATE 100 MCG/0.5ML IJ SOLN: 100.0000 ug | INTRAMUSCULAR | Status: DC

## 2011-07-16 MED ORDER — FERUMOXYTOL INJECTION 510 MG/17 ML
510.0000 mg | Freq: Once | INTRAVENOUS | Status: AC
  Administered 2011-07-16: 510 mg via INTRAVENOUS
  Filled 2011-07-16: qty 17

## 2011-07-16 MED ORDER — CALCIUM ACETATE 667 MG PO CAPS
667.0000 mg | ORAL_CAPSULE | Freq: Three times a day (TID) | ORAL | Status: DC
  Administered 2011-07-16 – 2011-07-20 (×9): 667 mg via ORAL
  Filled 2011-07-16 (×14): qty 1

## 2011-07-16 NOTE — Progress Notes (Signed)
PATIENT ID: Roy Dyer  MRN: [id number]  DOB/AGE:  1965-11-26 / 46 y.o.  7 Days Post-Op Procedure(s) (LRB): TOTAL HIP ARTHROPLASTY (Right)    PROGRESS NOTE Subjective: Patient is alert, oriented,noNausea, no Vomiting, yes passing gas, no Bowel Movement. Taking PO well.  Voiding.  Denies SOB, Chest or Calf Pain. Using Incentive Spirometer, PAS in place. Ambulating in the hallway with PT.   Patient reports pain as mild  .    Objective: Vital signs in last 24 hours: Filed Vitals:   07/15/11 2141 07/15/11 2152 07/16/11 0626 07/16/11 1007  BP: 109/58 109/58 94/59   Pulse: 119 119 108   Temp: 98 F (36.7 C) 97.2 F (36.2 C) 97.2 F (36.2 C)   TempSrc:      Resp: 18 18 18    Height:      Weight:      SpO2: 92% 92% 98% 98%      Intake/Output from previous day: I/O last 3 completed shifts: In: 720 [P.O.:720] Out: 1050 [Urine:1050]   Intake/Output this shift:     LABORATORY DATA:  Basename 07/16/11 0635 07/15/11 0555  WBC 7.8 6.8  HGB 8.2* 8.1*  HCT 23.7* 23.1*  PLT 93* 77*  NA 133* 136  K 3.9 4.2  CL 96 100  CO2 24 25  BUN 57* 50*  CREATININE 6.60* 6.27*  GLUCOSE 111* 117*  GLUCAP -- --  INR 2.28* 1.63*  CALCIUM 8.6 --    Examination: Neurologically intact ABD soft Neurovascular intact Sensation intact distally Intact pulses distally Dorsiflexion/Plantar flexion intact Incision: dressing C/D/I} XR AP&Lat of hip shows well placed\fixed THA  Assessment:   7 Days Post-Op Procedure(s) (LRB): TOTAL HIP ARTHROPLASTY (Right) ADDITIONAL DIAGNOSIS:  Acute Blood Loss Anemia and Renal Insufficiency Chronic  Plan: PT/OT WBAT, THA  posterior precautions  DVT Prophylaxis: SCDs  DISCHARGE PLAN: Home when cleared by nephrology - OK for d/c from an orthopedic standpoint.    DISCHARGE NEEDS: HHPT, HHRN, Walker and 3-in-1 comode seat

## 2011-07-16 NOTE — Progress Notes (Signed)
Subjective: Interval History: None Feels pretty good except for hiccups (gets offand on)  Objective: MEDS Reviewed: . allopurinol  100 mg Oral Daily  . atorvastatin  80 mg Oral Daily  . calcitRIOL  0.25 mcg Oral Daily  . darbepoetin (ARANESP) injection - DIALYSIS  100 mcg Subcutaneous Weekly  . esomeprazole  40 mg Oral Daily  . fluticasone  1 puff Inhalation BID  . furosemide  40 mg Oral BID  . gabapentin  100 mg Oral QHS  . isosorbide mononitrate  30 mg Oral Daily  . metoprolol  25 mg Oral BID  . niacin  1,000 mg Oral QHS  . sodium bicarbonate  1,300 mg Oral TID  . DISCONTD: darbepoetin (ARANESP) injection - DIALYSIS  60 mcg Subcutaneous Weekly  . DISCONTD: NIFEdipine  10 mg Oral TID   Vital signs in last 24 hours:  Temp:  [97.2 F (36.2 C)-99.1 F (37.3 C)] 97.2 F (36.2 C) (03/04 0626) Pulse Rate:  [108-119] 108  (03/04 0626) Resp:  [18] 18  (03/04 0626) BP: (94-109)/(58-61) 94/59 mmHg (03/04 0626) SpO2:  [92 %-98 %] 98 % (03/04 1007) Weight change:   Intake/Output: I/O last 3 completed shifts: In: 720 [P.O.:720] Out: 1050 [Urine:1050]  Intake/Output this shift:   Not recorded EXAM: CVS-Regular, no rub RS- Lungs with a few rhonchi, no rales ABD-Soft EXT- right with 1+ edema, left with no edema; incisions not examined Left AVF patent but with a lot of bruising Lab Results:  [facility] 07/16/11 0635 07/15/11 0555 07/14/11 0805  WBC 7.8 6.8 7.9  HGB 8.2* 8.1* 9.0*  HCT 23.7* 23.1* 25.7*  PLT 93* 77* 92*   BMET  Basename 07/16/11 0635 07/15/11 0555 07/14/11 1157  NA 133* 136 136  K 3.9 4.2 4.2  CL 96 100 100  CO2 24 25 25   GLUCOSE 111* 117* 123*  BUN 57* 50* 47*  CREATININE 6.60* 6.27* 5.61*  CALCIUM 8.6 8.9 8.6  PHOS 5.9* 5.4* 4.0   Lab Results  Component Value Date   IRON 10* 07/11/2011   TIBC 154* 07/11/2011   FERRITIN 132 07/11/2011    Assessment/Plan: Assessment/Plan:  1. 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 C/o  sob but exam improved. Creatinine is slowly rising but volume is OK and no clinical indication to try to dialyze again so will monitor labs in AM - if slower rate of rise could be monitored as outpt;  2. CHF- as above, continue with lasix; not SOB 3. ABLA/anemia of chronic disease- iron stores low; give IV feraheme; increase darbo to 100/week; 4. Hyperkalemia- normal K now 5. Metabolic acidosis-corrected 6. AVN s/p RORIF- pain control and PT/OT  7. CAD- currently stable. On plavix, NTG, Betablocker  8. HTN- better. Decrease metoprolol  9. Gout decrease allopurinol to 100mg  qd SLE- stable  10. Raynaud's- stop nifedipine due to soft BP 11. Vasc access- small infiltration 07/13/11, +T/B 12. 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

## 2011-07-16 NOTE — Progress Notes (Signed)
Patient ID: Roy Dyer, male   DOB: Aug 25, 1965, 46 y.o.   MRN: [id number] @ Subjective:  Denies SSCP, palpitations or Dyspnea   Objective:  Filed Vitals:   07/15/11 2119 07/15/11 2141 07/15/11 2152 07/16/11 0626  BP: 109/58 109/58 109/58 94/59  Pulse: 119 119 119 108  Temp:  98 F (36.7 C) 97.2 F (36.2 C) 97.2 F (36.2 C)  TempSrc:      Resp:  18 18 18   Height:      Weight:      SpO2:  92% 92% 98%    Intake/Output from previous day:  Intake/Output Summary (Last 24 hours) at 07/16/11 0831 Last data filed at 07/16/11 0600  Gross per 24 hour  Intake    240 ml  Output    400 ml  Net   -160 ml    Physical Exam: No distress Lungs clear S/S2 BS positive Neuro nonfocal RUE fistual with thrill Plus 2 PT  Lab Results: Basic Metabolic Panel:  Basename 07/16/11 0635 07/15/11 0555  NA 133* 136  K 3.9 4.2  CL 96 100  CO2 24 25  GLUCOSE 111* 117*  BUN 57* 50*  CREATININE 6.60* 6.27*  CALCIUM 8.6 8.9  MG -- --  PHOS 5.9* 5.4*   Liver Function Tests:  Basename 07/16/11 0635 07/15/11 0555  AST -- --  ALT -- --  ALKPHOS -- --  BILITOT -- --  PROT -- --  ALBUMIN 2.0* 2.0*   No results found for this basename: LIPASE:2,AMYLASE:2 in the last 72 hours CBC:  Basename 07/16/11 0635 07/15/11 0555  WBC 7.8 6.8  NEUTROABS -- --  HGB 8.2* 8.1*  HCT 23.7* 23.1*  MCV 89.8 89.9  PLT 93* 77*    No results found.  Cardiac Studies:  Medications:     . allopurinol  100 mg Oral Daily  . atorvastatin  80 mg Oral Daily  . calcitRIOL  0.25 mcg Oral Daily  . darbepoetin (ARANESP) injection - DIALYSIS  60 mcg Subcutaneous Weekly  . esomeprazole  40 mg Oral Daily  . fluticasone  1 puff Inhalation BID  . furosemide  40 mg Oral BID  . gabapentin  100 mg Oral QHS  . isosorbide mononitrate  30 mg Oral Daily  . metoprolol  25 mg Oral BID  . niacin  1,000 mg Oral QHS  . NIFEdipine  10 mg Oral TID  . sodium bicarbonate  1,300 mg Oral TID       Assessment/Plan:    CAD:  3 stents in all major vessels.  Anterior MI 3/12 with DES to LAD.  AICD.  No angina resume ASA and Plavix CRF:  Dialysis x1  Making some urine.  With poor EF 35% would dialyze if input more than 1 liter positive Anemia:  With CAD and suppressed bone marrow would transfuse to Hb of 10.  Continue Aranesp ? Add iron  [name] 07/16/2011, 8:31 AM

## 2011-07-16 NOTE — Progress Notes (Signed)
Physical Therapy Treatment Patient Details Name: Roy Dyer MRN: [id number] DOB: 24-May-1965 Today's Date: 07/16/2011  PT Assessment/Plan  PT - Assessment/Plan Comments on Treatment Session: Hard worker; managing in bed well with Post Hip Prec; seems frustrated with fatigue and shortness of breath limiting activity PT Plan: Discharge plan remains appropriate;Frequency remains appropriate PT Frequency: 7X/week Follow Up Recommendations: Home health PT;Supervision/Assistance - 24 hour Equipment Recommended: None recommended by PT PT Goals  Acute Rehab PT Goals Time For Goal Achievement: 7 days Pt will go Supine/Side to Sit: with modified independence PT Goal: Supine/Side to Sit - Progress: Progressing toward goal Pt will go Sit to Supine/Side: with supervision PT Goal: Sit to Supine/Side - Progress: Progressing toward goal Pt will go Sit to Stand: with modified independence PT Goal: Sit to Stand - Progress: Progressing toward goal Pt will go Stand to Sit: with modified independence PT Goal: Stand to Sit - Progress: Progressing toward goal Pt will Ambulate: >150 feet;with supervision;with rolling walker PT Goal: Ambulate - Progress: Progressing toward goal Pt will Go Up / Down Stairs: 3-5 stairs;with rail(s);with min assist PT Goal: Up/Down Stairs - Progress: Other (comment) Pt will Perform Home Exercise Program: with supervision, verbal cues required/provided PT Goal: Perform Home Exercise Program - Progress: Progressing toward goal Additional Goals Additional Goal #1: Pt will verbalize and adhere to posterior Hip Prec with mobility acts PT Goal: Additional Goal #1 - Progress: Progressing toward goal  PT Treatment Precautions/Restrictions  Precautions Precautions: Posterior Hip Precaution Booklet Issued: Yes (comment) Precaution Comments: Pt able to recall 3/3 Post Hip Prec Restrictions Weight Bearing Restrictions: Yes RLE Weight Bearing: Weight bearing as tolerated Mobility  (including Balance) Bed Mobility Supine to Sit: 5: Supervision Supine to Sit Details (indicate cue type and reason): Good maintenance of hip prec Sit to Supine: 5: Supervision Sit to Supine - Details (indicate cue type and reason): Good maintenance of hipprec Transfers Sit to Stand: With upper extremity assist;5: Supervision Sit to Stand Details (indicate cue type and reason): Cues to keep shoulders back for post hip prec Stand to Sit: 5: Supervision;With armrests Stand to Sit Details: continued cues for extending RLE forward to observe post hip prec Ambulation/Gait Ambulation/Gait Assistance: 5: Supervision Ambulation/Gait Assistance Details (indicate cue type and reason): required cues for no internal rotation with turns; amb distance limited by shortness of breath; HR highest observed 152, decr to 121 with seated rest; O2 sat range >94% Ambulation Distance (Feet): 75 Feet Assistive device: Rolling walker    Exercise  Total Joint Exercises Ankle Circles/Pumps: AROM;20 reps;Seated Quad Sets: AROM;Both;10 reps Towel Squeeze: PROM;Both;10 reps;Supine Short Arc Quad: AROM;Right;10 reps;Supine Heel Slides: AROM;Right;10 reps;Supine Hip ABduction/ADduction: AROM;Right;10 reps;Supine Standing Hip Extension: AROM;Right;10 reps;Supine (Also standing hip flexion and abduction 10 reps each) Bridges: AROM;Both;10 reps;Supine End of Session PT - End of Session Activity Tolerance: Patient limited by fatigue Patient left: in bed;with call bell in reach General Behavior During Session:  (reports being exhausted) Cognition: [employer] for tasks performed  [facility], [place]  07/16/2011, 10:33 AM

## 2011-07-17 ENCOUNTER — Inpatient Hospital Stay (HOSPITAL_COMMUNITY): Payer: Managed Care, Other (non HMO)

## 2011-07-17 ENCOUNTER — Telehealth: Payer: Self-pay | Admitting: Cardiovascular Disease

## 2011-07-17 LAB — PROTIME-INR
INR: 2.39 — ABNORMAL HIGH (ref 0.00–1.49)
Prothrombin Time: 26.5 seconds — ABNORMAL HIGH (ref 11.6–15.2)

## 2011-07-17 LAB — RENAL FUNCTION PANEL
CO2: 26 mEq/L (ref 19–32)
GFR calc Af Amer: 10 mL/min — ABNORMAL LOW (ref >90)
Glucose, Bld: 90 mg/dL (ref 70–99)
Phosphorus: 5.8 mg/dL — ABNORMAL HIGH (ref 2.3–4.6)
Potassium: 4.5 mEq/L (ref 3.5–5.1)
Sodium: 131 mEq/L — ABNORMAL LOW (ref 135–145)

## 2011-07-17 LAB — PREPARE RBC (CROSSMATCH)

## 2011-07-17 LAB — CBC
Hemoglobin: 8.1 g/dL — ABNORMAL LOW (ref 13.0–17.0)
RBC: 2.57 MIL/uL — ABNORMAL LOW (ref 4.22–5.81)

## 2011-07-17 MED ORDER — METOPROLOL TARTRATE 50 MG PO TABS
50.0000 mg | ORAL_TABLET | Freq: Three times a day (TID) | ORAL | Status: DC
  Administered 2011-07-17 – 2011-07-20 (×7): 50 mg via ORAL
  Filled 2011-07-17 (×13): qty 1

## 2011-07-17 MED ORDER — AMIODARONE HCL 200 MG PO TABS
200.0000 mg | ORAL_TABLET | Freq: Three times a day (TID) | ORAL | Status: DC
  Administered 2011-07-17 – 2011-07-20 (×9): 200 mg via ORAL
  Filled 2011-07-17 (×13): qty 1

## 2011-07-17 MED ORDER — CLOPIDOGREL BISULFATE 75 MG PO TABS
75.0000 mg | ORAL_TABLET | Freq: Every day | ORAL | Status: DC
  Administered 2011-07-18 – 2011-07-20 (×3): 75 mg via ORAL
  Filled 2011-07-17 (×5): qty 1

## 2011-07-17 MED ORDER — CLOPIDOGREL BISULFATE 75 MG PO TABS: 75.0000 mg | ORAL_TABLET | Freq: Every day | ORAL | Status: DC

## 2011-07-17 MED ORDER — DARBEPOETIN ALFA-POLYSORBATE 100 MCG/0.5ML IJ SOLN
100.0000 ug | INTRAMUSCULAR | Status: DC
  Filled 2011-07-17: qty 0.5

## 2011-07-17 MED ORDER — ASPIRIN EC 81 MG PO TBEC
81.0000 mg | DELAYED_RELEASE_TABLET | Freq: Every day | ORAL | Status: DC
  Administered 2011-07-18 – 2011-07-20 (×3): 81 mg via ORAL
  Filled 2011-07-17 (×4): qty 1

## 2011-07-17 NOTE — Progress Notes (Signed)
Subjective: Interval History: feels much worse today than yesterday; hiccups gone but nauseous and no appetite; more swollen but has been drinking a lot of fluids and not making much urine  Objective:  Vital signs in last 24 hours:  Temp:  [97.1 F (36.2 C)-98.4 F (36.9 C)] 97.4 F (36.3 C) (03/05 0640) Pulse Rate:  [106-118] 118  (03/05 0640) Resp:  [18-20] 20  (03/05 0640) BP: (108-110)/(61-67) 110/65 mmHg (03/05 1100) SpO2:  [91 %-96 %] 96 % (03/05 0758) Weight change:   Intake/Output: I/O last 3 completed shifts: In: -  Out: 1400 [Urine:1400]  Intake/Output this shift:  Total I/O In: 240 [P.O.:240] Out: -  Medications Reviewed: . allopurinol  100 mg Oral Daily  . amiodarone  200 mg Oral TID  . aspirin EC  81 mg Oral Daily  . atorvastatin  80 mg Oral Daily  . calcitRIOL  0.25 mcg Oral Daily  . calcium acetate  667 mg Oral TID WC  . clopidogrel  75 mg Oral Q breakfast  . darbepoetin (ARANESP) injection - DIALYSIS  100 mcg Subcutaneous Weekly  . esomeprazole  40 mg Oral Daily  . ferumoxytol  510 mg Intravenous Once  . fluticasone  1 puff Inhalation BID  . furosemide  40 mg Oral BID  . gabapentin  100 mg Oral QHS  . isosorbide mononitrate  30 mg Oral Daily  . metoprolol  50 mg Oral TID  . niacin  1,000 mg Oral QHS  . sodium bicarbonate  1,300 mg Oral TID  . DISCONTD: clopidogrel  75 mg Oral Q breakfast  . DISCONTD: darbepoetin (ARANESP) injection - DIALYSIS  60 mcg Subcutaneous Weekly  . DISCONTD: metoprolol  25 mg Oral BID  . DISCONTD: NIFEdipine  10 mg Oral TID   EXAM: Looks uncomfortable Wearing O2 Tachy No rub Abd soft 2+ edema LE's; arms puffy as well AVF ecchymotic but positive bruit and thrill  Lab Results:  Basename 07/17/11 0546 07/16/11 0635 07/15/11 0555  WBC 9.6 7.8 6.8  HGB 8.1* 8.2* 8.1*  HCT 22.8* 23.7* 23.1*  PLT 108* 93* 77*   BMET  Basename 07/17/11 0546 07/16/11 0635 07/15/11 0555  NA 131* 133* 136  K 4.5 3.9 4.2  CL 94* 96 100    CO2 26 24 25   GLUCOSE 90 111* 117*  BUN 63* 57* 50*  CREATININE 6.81* 6.60* 6.27*  CALCIUM 8.7 8.6 8.9  PHOS 5.8* 5.9* 5.4*   LFT  Basename 07/17/11 0546  PROT --  ALBUMIN 2.0*  AST --  ALT --  ALKPHOS --  BILITOT --  BILIDIR --  IBILI --   PT/INR  Basename 07/17/11 0546 07/16/11 0635  LABPROT 26.5* 25.5*  INR 2.39* 2.28*  PTH: Lab Results  Component Value Date   PTH 85.8* 04/23/2011   CALCIUM 8.7 07/17/2011   CAION 1.19 11/20/2010   PHOS 5.8* 07/17/2011   Assessment/Plan:  1. 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 C/o sob but exam improved, then today feels much worse - volume is up (fluid indiscretion plus inadequate output) and is nauseous. Appears we will have to go ahead and commit him to dialysis long term and needs today as well; hopeful access will hold up - if not will need permcath; start CLIP process 2. CHF- start maintenance dialysis and pull 2 liters with today's treatment 3. ABLA/anemia of chronic disease- iron stores low; gave IV feraheme yesterday; increased darbo to 100/week; Dr. Johnsie Cancel wants patient transfused to  Hb of 10 or greater due to CAD - will give PRBC's with HD today 4. Hyperkalemia- normal K now 5. Metabolic acidosis-corrected 6. AVN s/p RORIF- pain control and PT/OT  7. CAD/stents/prev MI/EF35%- currently stable. On plavix (restarted today), NTG, Betablocker increased today and amio added by Dr. Johnsie Cancel  8. HTN- better. 9. Gout decrease allopurinol to 100mg  qd  10. SLE- stable  11. Raynaud's- stopped nifedipine due to soft BP 12. Vasc access- small infiltration 07/13/11, +T/B 13. 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

## 2011-07-17 NOTE — Telephone Encounter (Signed)
Spoke with wife is wanting husband trasferred to  cardiac floor to be monitored due to  fluid build up and kidneys shutting down per wife kidney md wants pt to be monitored more closely  by cardiologists./cy

## 2011-07-17 NOTE — Progress Notes (Signed)
Physical Therapy Note   07/17/11 0946  PT Visit Information  Reason Eval/Treat Not Completed Patient refused (Politely declined; Fatigued, worried [id number] status)  PT - Assessment/Plan  Comments on Treatment Session Will follow-up tomorrw; Hopefully Roy Dyer' activity tolerance will improve with possible HD and transfusion    [place]oney [place], [place] [phone]

## 2011-07-17 NOTE — H&P (Addendum)
Rae Lips [id number]  Outpatient Primary MD for the patient is [name], MD, MD  With History of -  Past Medical History  Diagnosis Date  . Lupus   . CAD (coronary artery disease)      stents RCA/Circ 2001, BMS to LAD 07/2010  . CRF (chronic renal failure)       Baseline Cr 4.5 as of 04/2011 (GFR<20), Dr. Clover Mealy:  he is being prepped for dialysis and referred to [facility] for transplant consideration.  . Cardiomyopathy     Ischemic:  02/2011 EF 30-35%.   Single chamber ICD 11/20/10 ([name])  . Gout   . 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 Left ventricular thrombus 2012    Re-eval 02/2011 showed thrombus RESOLVED, so coumadin was d/c'd (was on it for 24mo)  . implantable cardiac defibrillator single chamber     Medtronic  . Hypertension   . Anemia   . Heart attack 2001 and 2012    3 stents and defib placed in July  . GERD (gastroesophageal reflux disease)   . Hiatal hernia       Past Surgical History  Procedure Date  . Stents     05-2010 and 2- in 2010  . Partial hip arthroplasty     left  . Renal biopsy   . Tympanostomy tube placement 46 yrs old  . Joint replacement   . Av fistula placement 03/28/2011    Procedure: ARTERIOVENOUS (AV) FISTULA CREATION;  Surgeon: Rosetta Posner, MD;  Location: [place];  Service: Vascular;  Laterality: Left;  Creation of Left radiocephallic cimino fistula  . Cardiac catheterization     08/2010  . US echocardiography 12/2010  . Cardiovascular stress test 07/2010  . Av fistula placement 04/27/2011    Procedure: ARTERIOVENOUS (AV) FISTULA CREATION;  Surgeon: Rosetta Posner, MD;  Location: [place];  Service: Vascular;  Laterality: Left;  left basilic vein transposition  . Insert / replace / remove pacemaker     medtronic        dr Juleen China    [place]   icd only  . Total hip arthroplasty 07/09/2011    Procedure: TOTAL HIP ARTHROPLASTY;  Surgeon: Kerin Salen, MD;  Location: [facility];   Service: Orthopedics;  Laterality: Right;    in for   No chief complaint on file.    HPI  Roy Dyer  is a 46 y.o. 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 Mayer Camel, patient is now requiring dialysis and is unable to be discharged home, today I was called by the orthopedics team to consult on the patient and take over the care of the patient.  Patient is currently in the dialysis unit getting dialyzed hysterectomy symptom-free he has mild nausea and few hiccups which are in his uremic symptoms, he also complains of bilateral arm tingling which has been going on since his surgery 8 days ago. Denies any weakness denies any other symptomatic complaints.    Review of Systems    In addition to the HPI above,  No Fever-chills, No Headache, No changes with  Vision or hearing, No problems swallowing food or Liquids, No Chest pain, Cough or Shortness of Breath, No Abdominal pain, No Nausea or Vommitting, Bowel movements are regular, No Blood in stool or Urine, No dysuria, No new skin rashes or bruises, No new joints pains-aches,  No new weakness, bilateral arm  Tingling for 1 week No recent weight gain or loss, No polyuria, polydypsia or polyphagia, No significant Mental Stressors.  A full 10 point Review of Systems was done, except as stated above, all other Review of Systems were negative.   Social History History  Substance Use Topics  . Smoking status: Former Smoker -- 0.5 packs/day for 30 years    Types: Cigarettes    Quit date: 08/02/2010  . Smokeless tobacco: Never Used  . Alcohol Use: No      Family History Family History  Problem Relation Age  of Onset  . Kidney failure Mother   . Lupus Mother   . Stroke Mother   . Diabetes Mother     type 2  . Heart attack Father     X 7  . Hypertension Father   . Hyperlipidemia Father   . Heart attack Sister 12    X 1  . Hyperlipidemia Sister   . Hypertension Sister   . Heart disease Sister   . Heart attack Paternal Grandfather   . Heart disease Paternal Aunt   . Heart disease Paternal Uncle       Prior to Admission medications   Medication Sig Start Date End Date Taking? Authorizing Provider  allopurinol (ZYLOPRIM) 300 MG tablet Take 300 mg by mouth daily.   Yes Historical Provider, MD  aspirin 81 MG tablet Take 1 tablet (81 mg total) by mouth daily. 09/25/10  Yes Deboraha Sprang, MD  atorvastatin (LIPITOR) 80 MG tablet Take 80 mg by mouth daily.   Yes Historical Provider, MD  calcitRIOL (ROCALTROL) 0.25 MCG capsule Take 0.25 mcg by mouth daily.   Yes Historical Provider, MD  clopidogrel (PLAVIX) 75 MG tablet Take 75 mg by mouth daily.   Yes Historical Provider, MD  esomeprazole (NEXIUM) 40 MG capsule Take 40 mg by mouth daily before breakfast.     Yes Historical Provider, MD  fluticasone (FLOVENT HFA) 44 MCG/ACT inhaler Inhale 1 puff into the lungs 2 (two) times daily.   Yes Historical Provider, MD  furosemide (LASIX) 40 MG tablet Take 40 mg by mouth daily as needed. For fluid. 02/22/11  Yes [name], MD  isosorbide mononitrate (IMDUR) 30 MG 24 hr tablet Take 30 mg by mouth daily.   Yes Historical Provider, MD  metoprolol (LOPRESSOR) 100 MG tablet Take 100 mg by mouth 2 (two) times daily.   Yes Historical Provider, MD  niacin (NIASPAN) 500 MG CR tablet Take 1,000 mg by mouth at bedtime.    Yes Historical Provider, MD  NIFEdipine (PROCARDIA) 10 MG capsule Take 10 mg by mouth 3 (three) times daily.   Yes Historical Provider, MD  traMADol (ULTRAM) 50 MG tablet Take 1 tablet by mouth Every 6 hours as needed. For pain 05/17/11  Yes Historical Provider, MD  dexlansoprazole (DEXILANT)  60 MG capsule Take 60 mg by mouth daily.    Historical Provider, MD  nitroGLYCERIN (NITROSTAT) 0.4 MG SL tablet Place 0.4 mg under the tongue every 5 (five) minutes as needed. For chest pains    Historical Provider, MD  oxyCODONE-acetaminophen (TYLOX) 5-500 MG per capsule Take 1 tablet by mouth Every 6 hours as needed.  For pain 05/02/11   Historical Provider, MD    No Known Allergies  Physical Exam  Vitals  Blood pressure 126/73, pulse 114, temperature 98.9 F (37.2 C), temperature source Oral, resp. rate 18, height 5' 6.93" (1.7 m), weight 98 kg (216 lb 0.8 oz), SpO2 96.00%.   1. General middle aged white male lying in bed in NAD,     2. Normal affect and insight, Not Suicidal or Homicidal, Awake Alert, Oriented *3.  3. No F.N deficits, ALL C.Nerves Intact, Strength 5/5 all 4 extremities, Sensation intact all 4 extremities, Plantars down going.  4. Ears and Eyes appear Normal, Conjunctivae clear, PERRLA. Moist Oral Mucosa.  5. Supple Neck, No JVD, No cervical lymphadenopathy appriciated, No Carotid Bruits.  6. Symmetrical Chest wall movement, Good air movement bilaterally, CTAB.  7. RRR, No Gallops, Rubs or Murmurs, No Parasternal Heave.  8. Positive Bowel Sounds, Abdomen Soft, Non tender, No organomegaly appriciated,       No rebound -guarding or rigidity.  9.  No Cyanosis, Normal Skin Turgor, No Skin Rash or Bruise.  10. Good muscle tone,  joints appear normal , no effusions, Normal ROM.  11. No Palpable Lymph Nodes in Neck or Axillae     Data Review  CBC  Lab 07/17/11 0546 07/16/11 0635 07/15/11 0555 07/14/11 0805 07/13/11 0645  WBC 9.6 7.8 6.8 7.9 7.3  HGB 8.1* 8.2* 8.1* 9.0* 7.3*  HCT 22.8* 23.7* 23.1* 25.7* 21.3*  PLT 108* 93* 77* 92* 115*  MCV 88.7 89.8 89.9 89.9 94.7  MCH 31.5 31.1 31.5 31.5 32.4  MCHC 35.5 34.6 35.1 35.0 34.3  RDW 17.6* 17.4* 17.5* 18.0* 15.4  LYMPHSABS -- -- -- -- --  MONOABS -- -- -- -- --  EOSABS -- -- -- -- --  BASOSABS -- -- --  -- --  BANDABS -- -- -- -- --   ------------------------------------------------------------------------------------------------------------------ Chemistries   Lab 07/17/11 0546 07/16/11 [id number] 07/15/11 0555 07/14/11 1157 07/13/11 0645  NA 131* 133* 136 136 134*  K 4.5 3.9 4.2 4.2 4.6  CL 94* 96 100 100 104  CO2 26 24 25 25  18*  GLUCOSE 90 111* 117* 123* 121*  BUN 63* 57* 50* 47* 63*  CREATININE 6.81* 6.60* 6.27* 5.61* 6.79*  CALCIUM 8.7 8.6 8.9 8.6 8.9  MG -- -- -- -- --  AST -- -- -- -- --  ALT -- -- -- -- --  ALKPHOS -- -- -- -- --  BILITOT -- -- -- -- --   ------------------------------------------------------------------------------------------------------------------ estimated creatinine clearance is 15.4 ml/min (by C-G formula based on Cr of 6.81). ------------------------------------------------------------------------------------------------------------------ No results found for this basename: TSH,T4TOTAL,FREET3,T3FREE,THYROIDAB in the last 72 hours  Coagulation profile  Lab 07/17/11 0546 07/16/11 0635 07/15/11 0555 07/14/11 0600 07/13/11 0645  INR 2.39* 2.28* 1.63* 1.63* 2.12*  PROTIME -- -- -- -- --   ------------------------------------------------------------------------------------------------------------------- No results found for this basename: DDIMER:2 in the last 72 hours ------------------------------------------------------------------------------------------------------------------- Cardiac Enzymes No results found for this basename: CK:3,CKMB:3,TROPONINI:3,MYOGLOBIN:3 in the last 168 hours ------------------------------------------------------------------------------------------------------------------ No components found with this basename: POCBNP:3 ------------------------------------------------------------------------------------------------------------------  Imaging results:   Dg Chest 2 View  07/13/2011  *RADIOLOGY REPORT*  Clinical Data:  Shortness of breath, cough.  Hypertension.  History of smoking.  CHEST - 2 VIEW  Comparison: 03/28/2011  Findings: Right-sided pacemaker / AICD lead overlies right ventricle.  Heart is enlarged.  No edema.  No focal consolidations or pleural effusions.  Mild degenerative changes are seen in the spine.  IMPRESSION: Cardiomegaly without  pulmonary edema.  Original Report Authenticated By: Glenice Bow, M.D.   Dg Pelvis Portable  07/09/2011  *RADIOLOGY REPORT*  Clinical Data: Hip replacement.  PORTABLE PELVIS  Comparison: Plain film 02/23/2009.  Findings: The patient has a new right total hip replacement.  The device is located.  No fracture is identified.  Gas in the soft tissues is noted.  Old left hip replacement is noted.  IMPRESSION: New right total hip without evidence of complication.  Original Report Authenticated By: Arvid Right. D'ALESSIO, M.D.   Dg Hip Portable 1 View Right  07/09/2011  *RADIOLOGY REPORT*  Clinical Data: Hip replacement.  PORTABLE RIGHT HIP - 1 VIEW  Comparison: None.  Findings: Right total hip replacement is in place.  Gas is seen in the soft tissues.  The hip appears located.  No fracture is identified.  IMPRESSION: Right hip replacement without evidence of complication.  Original Report Authenticated By: Arvid Right. Luther Parody, M.D.       Assessment & Plan  1. 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 Moshe Cipro, further CKD and AKI management per nephrology group.  2. Chronic systolic dysfunction with last known EF of 35%. Patient has ischemic cardiomyopathy status post AICD placement. Currently on beta blocker Lasix and dialysis for fluid removal, being followed by Dr. Johnsie Cancel patient's cardiologist, patient currently appears to be symptom-free and compensated, he is getting 2 units of packed RBC transfused today with his dialysis treatment per Dr.  Johnsie Cancel.   3. Coronary artery disease status post 3 stent placements in all 3 major vessels last being April of last year. Per cardiology aspirin Plavix has been resumed, patient is on Imdur and currently pain and symptom-free. He is getting 2 units of blood transfused to keep his H&H above 10 as per cardiology recommendation.   4. Past history of mural thrombus in his ventricle status post completion of Coumadin treatment in the past patient has finished Coumadin for this treatment 6 months ago, he was started for DVT prophylaxis Coumadin here by orthopedics team and that too has been stopped a few days ago per orthopedics PA who I communicated and clarified this personally. She still has elevated INR it will be closely monitored if it continues to climb gentle oral vitamin K can be given.   5. Dysrhythmia on telemetry for the last few days-likely proximal atrial fibrillation, patient being followed by cardiologist Dr. Roosvelt Harps and he has been placed on amiodarone along with increase in his beta blocker dose, currently not on anti-coagulation, he is on aspirin Plavix. He will be monitored on telemetry.    6. Essential hypertension - blood pressure is stable, continue present dose Lopressor.    7. Raynaud's disease - no cyanosis on exam, his nifedipine has been held by nephrology team as his blood pressures were slightly low and he was going for dialysis this can be resumed tomorrow if his blood pressure is better.    8. Tingling in both hands - patient has 2+ edema in both hands he is getting dialysis today, will get CT of C-spine as these symptoms have started right after his surgical procedure and anesthesia, and it might be prudent to rule out C-spine compromise.     9. History of gout currently pain-free allopurinol dose has been adjusted by nephrology will monitor.    10 Rt Hip Replacement - post op day 8, PT/OT WBAT,  THA posterior precautions - Ortho has promised to follow and  monitor daily.     DVT Prophylaxis Per Ortho Only TEDs & SCDs     AM Labs Ordered, also please review Full Orders   Admission, patients condition and plan of care including tests being ordered have been discussed with the patient  who indicates understanding and agree with the plan and Code Status.    Condition GUARDED   Thurnell Lose M.D on 07/17/2011 at 2:22 PM  [facility] Office  [phone]

## 2011-07-17 NOTE — Telephone Encounter (Signed)
New Msg: Pt wife calling wanting to speak with nurse regarding pt being moved to heart floor in [facility] to be monitored following pt hip replacement. Please return pt wife call to discuss further.

## 2011-07-17 NOTE — Progress Notes (Signed)
PATIENT ID: Roy Dyer  MRN: [id number]  DOB/AGE:  December 21, 1965 / 46 y.o.  8 Days Post-Op Procedure(s) (LRB): TOTAL HIP ARTHROPLASTY (Right)    PROGRESS NOTE Subjective: Patient is alert, oriented,noNausea, no Vomiting, yes passing gas, no Bowel Movement. Complains of nausea and SOB.  No chest or Calf Pain. Using Incentive Spirometer, PAS in place.  Renal will begin dialysis today.   Patient reports pain as mild  .    Objective: Vital signs in last 24 hours: Filed Vitals:   07/16/11 2220 07/17/11 0640 07/17/11 0758 07/17/11 1100  BP:  110/62  110/65  Pulse: 110 118    Temp:  97.4 F (36.3 C)    TempSrc:      Resp: 19 20    Height:      Weight:      SpO2: 93% 95% 96%       Intake/Output from previous day: I/O last 3 completed shifts: In: -  Out: 1400 [Urine:1400]   Intake/Output this shift: Total I/O In: 480 [P.O.:480] Out: -    LABORATORY DATA:  Basename 07/17/11 0546 07/16/11 0635  WBC 9.6 7.8  HGB 8.1* 8.2*  HCT 22.8* 23.7*  PLT 108* 93*  NA 131* 133*  K 4.5 3.9  CL 94* 96  CO2 26 24  BUN 63* 57*  CREATININE 6.81* 6.60*  GLUCOSE 90 111*  GLUCAP -- --  INR 2.39* 2.28*  CALCIUM 8.7 --    Examination: Neurologically intact ABD soft Neurovascular intact Sensation intact distally Incision: dressing C/D/I} XR AP&Lat of hip shows well placed\fixed THA  Assessment:   8 Days Post-Op Procedure(s) (LRB): TOTAL HIP ARTHROPLASTY (Right) ADDITIONAL DIAGNOSIS:  Acute Blood Loss Anemia and Renal Insufficiency Chronic  Plan: PT/OT WBAT, THA  posterior precautions  Renal will transfuse PRBC during dialysis today.   Will call [facility] for possible transfer to medicine service and telemetry floor.    DISCHARGE PLAN: Home  DISCHARGE NEEDS: HHPT, HHRN, Walker and 3-in-1 comode seat

## 2011-07-17 NOTE — Telephone Encounter (Signed)
Dr Johnsie Cancel spoke with pt's wife .Adonis Housekeeper

## 2011-07-17 NOTE — Progress Notes (Signed)
Patient ID: Roy Dyer, male   DOB: 09-20-1965, 46 y.o.   MRN: [id number] @ Subjective:  Denies SSCP HR up and arms/hands are swollen.  Less urination.   Objective:  Filed Vitals:   07/16/11 2128 07/16/11 2220 07/17/11 0640 07/17/11 0758  BP: 110/61  110/62   Pulse: 112 110 118   Temp: 97.1 F (36.2 C)  97.4 F (36.3 C)   TempSrc:      Resp: 20 19 20    Height:      Weight:      SpO2: 91% 93% 95% 96%    Intake/Output from previous day:  Intake/Output Summary (Last 24 hours) at 07/17/11 0912 Last data filed at 07/17/11 [id number]  Gross per 24 hour  Intake    240 ml  Output   1000 ml  Net   -760 ml    Physical Exam: No distress Lungs clear S/S2 BS positive Neuro nonfocal RUE fistual with thrill Plus 2 PT AICD under left clavicle Swelling RUE  Lab Results: Basic Metabolic Panel:  Basename 07/17/11 0546 07/16/11 0635  NA 131* 133*  K 4.5 3.9  CL 94* 96  CO2 26 24  GLUCOSE 90 111*  BUN 63* 57*  CREATININE 6.81* 6.60*  CALCIUM 8.7 8.6  MG -- --  PHOS 5.8* 5.9*   Liver Function Tests:  Basename 07/17/11 0546 07/16/11 0635  AST -- --  ALT -- --  ALKPHOS -- --  BILITOT -- --  PROT -- --  ALBUMIN 2.0* 2.0*   No results found for this basename: LIPASE:2,AMYLASE:2 in the last 72 hours CBC:  Basename 07/17/11 0546 07/16/11 0635  WBC 9.6 7.8  NEUTROABS -- --  HGB 8.1* 8.2*  HCT 22.8* 23.7*  MCV 88.7 89.8  PLT 108* 93*    No results found.  Cardiac Studies:  Medications:      . allopurinol  100 mg Oral Daily  . amiodarone  200 mg Oral TID  . atorvastatin  80 mg Oral Daily  . calcitRIOL  0.25 mcg Oral Daily  . calcium acetate  667 mg Oral TID WC  . darbepoetin (ARANESP) injection - DIALYSIS  100 mcg Subcutaneous Weekly  . esomeprazole  40 mg Oral Daily  . ferumoxytol  510 mg Intravenous Once  . fluticasone  1 puff Inhalation BID  . furosemide  40 mg Oral BID  . gabapentin  100 mg Oral QHS  . isosorbide mononitrate  30 mg Oral Daily  .  metoprolol  50 mg Oral TID  . niacin  1,000 mg Oral QHS  . sodium bicarbonate  1,300 mg Oral TID  . DISCONTD: darbepoetin (ARANESP) injection - DIALYSIS  60 mcg Subcutaneous Weekly  . DISCONTD: metoprolol  25 mg Oral BID  . DISCONTD: NIFEdipine  10 mg Oral TID       Assessment/Plan:  CAD:  3 stents in all major vessels.  Anterior MI 3/12 with DES to LAD.  AICD.  No angina resume ASA and Plavix CRF:  Needs dialysis today.  Has EF 35% urine output worse and clinical swelling with need for blood transfusions Anemia:  With CAD and suppressed bone marrow would transfuse to Hb of 10.  Continue Aranesp ? Add iron I have Written order for transfusion since primary service has not.   CHF:  EF 35% with CAD.  Control volume with dialysis Tachychardia:  Likely from anemia and post op state.  Previous ECG;s with sinus tach but in room palpations suggests PAF Will increase  lopresser and add amiodarone.  Hopefully transfusion will help with this as well If BP becomes an issue on dialysis Can hold imdur first.  [name] 07/17/2011, 9:12 AM

## 2011-07-17 NOTE — Progress Notes (Signed)
Utilization review completed. Rozanna Boer, RN, BSN. 07/17/11

## 2011-07-18 LAB — CBC
HCT: 29.8 % — ABNORMAL LOW (ref 39.0–52.0)
Hemoglobin: 10.2 g/dL — ABNORMAL LOW (ref 13.0–17.0)
MCV: 90 fL (ref 78.0–100.0)
RBC: 3.31 MIL/uL — ABNORMAL LOW (ref 4.22–5.81)
RDW: 17.6 % — ABNORMAL HIGH (ref 11.5–15.5)
WBC: 9.6 10*3/uL (ref 4.0–10.5)

## 2011-07-18 LAB — TYPE AND SCREEN: Antibody Screen: NEGATIVE

## 2011-07-18 LAB — RENAL FUNCTION PANEL
BUN: 39 mg/dL — ABNORMAL HIGH (ref 6–23)
CO2: 25 mEq/L (ref 19–32)
Chloride: 98 mEq/L (ref 96–112)
Creatinine, Ser: 4.93 mg/dL — ABNORMAL HIGH (ref 0.50–1.35)
Glucose, Bld: 78 mg/dL (ref 70–99)

## 2011-07-18 MED ORDER — PARICALCITOL 5 MCG/ML IV SOLN
1.0000 ug | INTRAVENOUS | Status: DC
  Administered 2011-07-20: 1 ug via INTRAVENOUS
  Filled 2011-07-18: qty 0.2

## 2011-07-18 MED ORDER — SODIUM CHLORIDE 0.9 % IV SOLN
125.0000 mg | INTRAVENOUS | Status: DC
  Administered 2011-07-20: 125 mg via INTRAVENOUS
  Filled 2011-07-18 (×3): qty 10

## 2011-07-18 NOTE — Progress Notes (Signed)
PATIENT ID: Roy Dyer  MRN: [id number]  DOB/AGE:  02/06/1966 / 46 y.o.  9 Days Post-Op Procedure(s) (LRB): TOTAL HIP ARTHROPLASTY (Right)    PROGRESS NOTE Subjective: Patient is alert, oriented,noNausea, no Vomiting, yes passing gas, no Bowel Movement. Taking PO well. Denies SOB, Chest or Calf Pain. Using Incentive Spirometer, PAS in place.  Feeling better after dialysis. Ambulating with PT. Patient reports pain as mild  .    Objective: Vital signs in last 24 hours: Filed Vitals:   07/17/11 1614 07/17/11 2143 07/18/11 0609 07/18/11 0838  BP: 138/82 110/65 105/60   Pulse: 116 109 99   Temp: 97.3 F (36.3 C) 98.9 F (37.2 C) 98.2 F (36.8 C)   TempSrc: Oral Oral Oral   Resp: 18 18 18    Height:      Weight: 96.9 kg (213 lb 10 oz)     SpO2: 100% 94% 94% 95%      Intake/Output from previous day: I/O last 3 completed shifts: In: 480 [P.O.:480] Out: 2150 [Urine:800; Other:1350]   Intake/Output this shift: Total I/O In: 360 [P.O.:360] Out: 400 [Urine:400]   LABORATORY DATA:  Basename 07/18/11 0540 07/17/11 0546  WBC 9.6 9.6  HGB 10.2* 8.1*  HCT 29.8* 22.8*  PLT 95* 108*  NA 136 131*  K 3.6 4.5  CL 98 94*  CO2 25 26  BUN 39* 63*  CREATININE 4.93* 6.81*  GLUCOSE 78 90  GLUCAP -- --  INR 2.63* 2.39*  CALCIUM 8.9 --    Examination: Neurologically intact ABD soft Neurovascular intact Sensation intact distally Intact pulses distally Dorsiflexion/Plantar flexion intact Incision: scant drainage} XR AP&Lat of hip shows well placed\fixed THA  Assessment:   9 Days Post-Op Procedure(s) (LRB): TOTAL HIP ARTHROPLASTY (Right) ADDITIONAL DIAGNOSIS:  Acute Blood Loss Anemia and Renal Insufficiency Chronic  Plan: PT/OT WBAT, THA  posterior precautions  No Coumadin.  Change dressing today.  Dialysis per nephrology.  Home when stable.- will remove sutures prior to d/c.  DISCHARGE PLAN: Home  DISCHARGE NEEDS: HHPT, HHRN, Walker and 3-in-1 comode seat

## 2011-07-18 NOTE — Progress Notes (Signed)
Patient discussed at the Long Length of Stay Roy Dyer [facility] 07/18/2011

## 2011-07-18 NOTE — Progress Notes (Signed)
Patient ID: Roy Dyer, male   DOB: May 28, 1965, 46 y.o.   MRN: [id number] SUBJECTIVE: No Sob or CP. Swelling is improved. Still has hiccups.  Filed Vitals:   07/17/11 1600 07/17/11 1614 07/17/11 2143 07/18/11 0609  BP: 142/84 138/82 110/65 105/60  Pulse: 114 116 109 99  Temp:  97.3 F (36.3 C) 98.9 F (37.2 C) 98.2 F (36.8 C)  TempSrc:  Oral Oral Oral  Resp: 18 18 18 18   Height:      Weight:  213 lb 10 oz (96.9 kg)    SpO2:  100% 94% 94%    Intake/Output Summary (Last 24 hours) at 07/18/11 0831 Last data filed at 07/18/11 [id number]  Gross per 24 hour  Intake    360 ml  Output   1350 ml  Net   -990 ml    LABS: Basic Metabolic Panel:  Basename 07/18/11 0540 07/17/11 0546  NA 136 131*  K 3.6 4.5  CL 98 94*  CO2 25 26  GLUCOSE 78 90  BUN 39* 63*  CREATININE 4.93* 6.81*  CALCIUM 8.9 8.7  MG -- --  PHOS 4.3 5.8*   Liver Function Tests:  Basename 07/18/11 0540 07/17/11 0546  AST -- --  ALT -- --  ALKPHOS -- --  BILITOT -- --  PROT -- --  ALBUMIN 2.0* 2.0*   No results found for this basename: LIPASE:2,AMYLASE:2 in the last 72 hours CBC:  Basename 07/18/11 0540 07/17/11 0546  WBC 9.6 9.6  NEUTROABS -- --  HGB 10.2* 8.1*  HCT 29.8* 22.8*  MCV 90.0 88.7  PLT 95* 108*   Cardiac Enzymes: No results found for this basename: CKTOTAL:3,CKMB:3,CKMBINDEX:3,TROPONINI:3 in the last 72 hours BNP: No components found with this basename: POCBNP:3 D-Dimer: No results found for this basename: DDIMER:2 in the last 72 hours Hemoglobin A1C: No results found for this basename: HGBA1C in the last 72 hours Fasting Lipid Panel: No results found for this basename: CHOL,HDL,LDLCALC,TRIG,CHOLHDL,LDLDIRECT in the last 72 hours Thyroid Function Tests: No results found for this basename: TSH,T4TOTAL,FREET3,T3FREE,THYROIDAB in the last 72 hours Anemia Panel: No results found for this basename: VITAMINB12,FOLATE,FERRITIN,TIBC,IRON,RETICCTPCT in the last 72 hours  RADIOLOGY: Dg  Chest 2 View  07/13/2011  *RADIOLOGY REPORT*  Clinical Data: Shortness of breath, cough.  Hypertension.  History of smoking.  CHEST - 2 VIEW  Comparison: 03/28/2011  Findings: Right-sided pacemaker / AICD lead overlies right ventricle.  Heart is enlarged.  No edema.  No focal consolidations or pleural effusions.  Mild degenerative changes are seen in the spine.  IMPRESSION: Cardiomegaly without pulmonary edema.  Original Report Authenticated By: Glenice Bow, M.D.   Dg Pelvis Portable  07/09/2011  *RADIOLOGY REPORT*  Clinical Data: Hip replacement.  PORTABLE PELVIS  Comparison: Plain film 02/23/2009.  Findings: The patient has a new right total hip replacement.  The device is located.  No fracture is identified.  Gas in the soft tissues is noted.  Old left hip replacement is noted.  IMPRESSION: New right total hip without evidence of complication.  Original Report Authenticated By: Arvid Right. D'ALESSIO, M.D.   Dg Hip Portable 1 View Right  07/09/2011  *RADIOLOGY REPORT*  Clinical Data: Hip replacement.  PORTABLE RIGHT HIP - 1 VIEW  Comparison: None.  Findings: Right total hip replacement is in place.  Gas is seen in the soft tissues.  The hip appears located.  No fracture is identified.  IMPRESSION: Right hip replacement without evidence of complication.  Original Report Authenticated By: Marcello Moores  L. Luther Parody, M.D.    PHYSICAL EXAM General: Well developed, well nourished, in no acute distress Head: Eyes PERRLA,   Lungs: Clear bilaterally to auscultation and percussion. Heart: HRRR S1 S2,  Pulses are 2+ & equal.            No carotid bruit. No JVD.or   Abdomen: Bowel sounds are positive, abdomen soft           . Extremities: No clubbing, cyanosis,2+ on RLE, 1+ on LLE  DP +1 Neuro: Alert and oriented X 3. Psych:  Good affect, responds appropriately    ASSESSMENT AND PLAN:  Principal Problem:  *Avascular necrosis of femoral head Right Active Problems:  Mixed hyperlipidemia  ischemic  cardiomyopathy status post anterolateral MI  LUPUS  implantable cardiac defibrillator -Medtronic-single-chamber  End stage renal disease  Raynaud's disease  Overall improved with decreased HR, improved Hgb. No further recommendations since Dr Kyla Balzarine note 3/5. Will follow. Jenell Milliner, MD 07/18/2011 8:31 AM

## 2011-07-18 NOTE — Progress Notes (Addendum)
Subjective: Interval History: dialysis yesterday for 2 1/2 hours with no fistula problems; Weight 98>96.9 and received 2 units of blood Feels much better Not SOB   Objective:  Vital signs in last 24 hours:  Temp:  [97.1 F (36.2 C)-99.4 F (37.4 C)] 98.2 F (36.8 C) (03/06 0609) Pulse Rate:  [99-118] 99  (03/06 0609) Resp:  [18-20] 18  (03/06 0609) BP: (105-142)/(60-84) 105/60 mmHg (03/06 0609) SpO2:  [94 %-100 %] 95 % (03/06 0838) Weight:  [96.9 kg (213 lb 10 oz)-98 kg (216 lb 0.8 oz)] 96.9 kg (213 lb 10 oz) (03/05 1614) Weight change:   Intake/Output: I/O last 3 completed shifts: In: 480 [P.O.:480] Out: 2150 [Urine:800; Other:1350]  Intake/Output this shift:  Total I/O In: 120 [P.O.:120] Out: 400 [Urine:400] SCHEDULED MEDS REVIEWED: . allopurinol  100 mg Oral Daily  . amiodarone  200 mg Oral TID  . aspirin EC  81 mg Oral Daily  . atorvastatin  80 mg Oral Daily  . calcitRIOL  0.25 mcg Oral Daily  . calcium acetate  667 mg Oral TID WC  . clopidogrel  75 mg Oral Q breakfast  . darbepoetin (ARANESP) injection - DIALYSIS  100 mcg Intravenous Q Tue-HD  . esomeprazole  40 mg Oral Daily  . fluticasone  1 puff Inhalation BID  . furosemide  40 mg Oral BID  . gabapentin  100 mg Oral QHS  . isosorbide mononitrate  30 mg Oral Daily  . metoprolol  50 mg Oral TID  . niacin  1,000 mg Oral QHS  . sodium bicarbonate  1,300 mg Oral TID  . DISCONTD: clopidogrel  75 mg Oral Q breakfast  . DISCONTD: darbepoetin (ARANESP) injection - DIALYSIS  100 mcg Subcutaneous Weekly  EXAM: Off oxygen up in chair Lungs clear Less tachy  Regular S1S2 no S3 Left AVF patent 1+edema le's right more than left  Lab Results:  Basename 07/18/11 0540 07/17/11 0546 07/16/11 0635  WBC 9.6 9.6 7.8  HGB 10.2* 8.1* 8.2*  HCT 29.8* 22.8* 23.7*  PLT 95* 108* 93*   BMET  Basename 07/18/11 0540 07/17/11 0546 07/16/11 0635  NA 136 131* 133*  K 3.6 4.5 3.9  CL 98 94* 96  CO2 25 26 24   GLUCOSE 78 90 111*    BUN 39* 63* 57*  CREATININE 4.93* 6.81* 6.60*  CALCIUM 8.9 8.7 8.6  PHOS 4.3 5.8* 5.9*   LFT  Basename 07/18/11 0540  PROT --  ALBUMIN 2.0*  AST --  ALT --  ALKPHOS --  BILITOT --  BILIDIR --  IBILI --   PT/INR  Basename 07/18/11 0540 07/17/11 0546  LABPROT 28.5* 26.5*  INR 2.63* 2.39*  Assessment/Plan: 1. New ESRD - anticipate TTHSAT schedule; lives in [place]; works in [employer]; [place] aware and will notify [facility] (will be good candidate for home dialysis in the future).  Once outpt arrangements are complete will advise.  Next HD Thursday 2. CHF- better after volume removal with HD 3. ABLA/anemia of chronic disease- iron stores low; gave IV feraheme this admit; increased darbo to 100/week; transfused with last HD; start weekly maintenance iron with HD; check stools 4. AVN s/p RORIF- pain control and PT/OT  5. CAD/stents/prev MI/EF35%- currently stable. On plavix , NTG, Betablocker increased  and amio added by Dr. Johnsie Cancel  6. HTN- better. 7. Gout decrease allopurinol to 100mg  qd  8. SLE- stable  9. Raynaud's- stopped nifedipine due to soft BP 10. Vasc access- small infiltration 07/13/11, +T/B 11. 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

## 2011-07-18 NOTE — Progress Notes (Signed)
Physical Therapy Cancel Note   07/18/11 1000  PT Visit Information  Reason Eval/Treat Not Completed Patient refused (Politely declined as visitors from out of town were here)  PT - Assessment/Plan  Comments on Treatment Session Overall, Roy Dyer is feeling better; He is well-able to follow posterior hip precautions; Wil plan to follow up tomorrow for PT; Noted transfer to tele floor    [place], [place]

## 2011-07-18 NOTE — Progress Notes (Signed)
Patient ID: Roy Dyer, male   DOB: 07/12/1965, 46 y.o.   MRN: [id number]  Subjective: No events overnight. Patient denies chest pain, shortness of breath, abdominal pain.   Objective:  Vital signs in last 24 hours:  Filed Vitals:   07/18/11 1406 07/18/11 1503 07/18/11 1832 07/18/11 2022  BP: 113/61 110/69  108/66  Pulse: 103 98  101  Temp: 97.5 F (36.4 C) 97 F (36.1 C)  98.7 F (37.1 C)  TempSrc: Oral Oral  Oral  Resp: 16 20  16   Height:      Weight:      SpO2: 96% 96% 97% 93%    Intake/Output from previous day:   Intake/Output Summary (Last 24 hours) at 07/18/11 2051 Last data filed at 07/18/11 1410  Gross per 24 hour  Intake    360 ml  Output    525 ml  Net   -165 ml    Physical Exam: General: Alert, awake, oriented x3, in no acute distress. HEENT: No bruits, no goiter. Moist mucous membranes, no scleral icterus, no conjunctival pallor. Heart: Regular rate and rhythm, S1/S2 +, no murmurs, rubs, gallops. Lungs: Clear to auscultation bilaterally. No wheezing, no rhonchi, no rales.  Abdomen: Soft, nontender, nondistended, positive bowel sounds. Extremities: No clubbing or cyanosis, positive pedal pulses. Neuro: Grossly nonfocal.  Lab Results:  Basic Metabolic Panel:    Component Value Date/Time   NA 136 07/18/2011 0540   K 3.6 07/18/2011 0540   CL 98 07/18/2011 0540   CO2 25 07/18/2011 0540   BUN 39* 07/18/2011 0540   CREATININE 4.93* 07/18/2011 0540   GLUCOSE 78 07/18/2011 0540   CALCIUM 8.9 07/18/2011 0540   CALCIUM 9.1 04/23/2011 0820   CBC:    Component Value Date/Time   WBC 9.6 07/18/2011 0540   HGB 10.2* 07/18/2011 0540   HCT 29.8* 07/18/2011 0540   PLT 95* 07/18/2011 0540   MCV 90.0 07/18/2011 0540   NEUTROABS 4.8 07/02/2011 1129   LYMPHSABS 2.1 07/02/2011 1129   MONOABS 0.6 07/02/2011 1129   EOSABS 0.4 07/02/2011 1129   BASOSABS 0.1 07/02/2011 1129      Lab 07/18/11 0540 07/17/11 0546 07/16/11 0635 07/15/11 0555 07/14/11 0805  WBC 9.6 9.6 7.8 6.8 7.9  HGB  10.2* 8.1* 8.2* 8.1* 9.0*  HCT 29.8* 22.8* 23.7* 23.1* 25.7*  PLT 95* 108* 93* 77* 92*  MCV 90.0 88.7 89.8 89.9 89.9  MCH 30.8 31.5 31.1 31.5 31.5  MCHC 34.2 35.5 34.6 35.1 35.0  RDW 17.6* 17.6* 17.4* 17.5* 18.0*  LYMPHSABS -- -- -- -- --  MONOABS -- -- -- -- --  EOSABS -- -- -- -- --  BASOSABS -- -- -- -- --  BANDABS -- -- -- -- --    Lab 07/18/11 0540 07/17/11 0546 07/16/11 0635 07/15/11 0555 07/14/11 1157  NA 136 131* 133* 136 136  K 3.6 4.5 3.9 4.2 4.2  CL 98 94* 96 100 100  CO2 25 26 24 25 25   GLUCOSE 78 90 111* 117* 123*  BUN 39* 63* 57* 50* 47*  CREATININE 4.93* 6.81* 6.60* 6.27* 5.61*  CALCIUM 8.9 8.7 8.6 8.9 8.6  MG -- -- -- -- --    Lab 07/18/11 0540 07/17/11 0546 07/16/11 0635 07/15/11 0555 07/14/11 0600  INR 2.63* 2.39* 2.28* 1.63* 1.63*  PROTIME -- -- -- -- --   Studies/Results: No results found.  Medications: Scheduled Meds:   . allopurinol  100 mg Oral Daily  . amiodarone  200  mg Oral TID  . aspirin EC  81 mg Oral Daily  . atorvastatin  80 mg Oral Daily  . calcium acetate  667 mg Oral TID WC  . clopidogrel  75 mg Oral Q breakfast  . darbepoetin (ARANESP) injection - DIALYSIS  100 mcg Intravenous Q Tue-HD  . esomeprazole  40 mg Oral Daily  . ferric gluconate (FERRLECIT/NULECIT) IV  125 mg Intravenous Q Thu-HD  . fluticasone  1 puff Inhalation BID  . gabapentin  100 mg Oral QHS  . isosorbide mononitrate  30 mg Oral Daily  . metoprolol  50 mg Oral TID  . niacin  1,000 mg Oral QHS  . paricalcitol  1 mcg Intravenous Q T,Th,Sa-HD  . DISCONTD: calcitRIOL  0.25 mcg Oral Daily  . DISCONTD: furosemide  40 mg Oral BID  . DISCONTD: sodium bicarbonate  1,300 mg Oral TID   Continuous Infusions:  PRN Meds:.sodium chloride, sodium chloride, acetaminophen, acetaminophen, bisacodyl, calcium carbonate (dosed in mg elemental calcium), camphor-menthol, diphenhydrAMINE, docusate sodium, feeding supplement (NEPRO CARB STEADY), feeding supplement (NEPRO CARB STEADY),  heparin, heparin, HYDROmorphone, hydrOXYzine, lidocaine, lidocaine-prilocaine, menthol-cetylpyridinium, methocarbamol(ROBAXIN) IV, methocarbamol, metoCLOPramide (REGLAN) injection, metoCLOPramide nitroGLYCERIN, ondansetron (ZOFRAN) IV, ondansetron, oxyCODONE, oxyCODONE-acetaminophen, pentafluoroprop-tetrafluoroeth, phenol, sorbitol, zolpidem  Assessment/Plan:  Principal Problem:  *Avascular necrosis of femoral head Right - now s/p ORIF - continue physical therapy and and analgesia for adequate pain control  Active Problems:  ischemic cardiomyopathy status post anterolateral MI - this appears to be clinically stable - pt is on Plavix, beta-blocker, and amiodarone - will continue to follow up on cardiology recommendations   LUPUS - clinically stable   Implantable cardiac defibrillator -[employer]tronic-single-chamber   End stage renal disease - newly diagnosed - nephrology service also following  - HD scheduled for tomorrow - plan is to continue with TTS schedule and nephrology service working on arrangements for outpatient schedules   Raynaud's disease - Nifedipine stopped due to somewhat low BP - will continue to monitor the vitals per floor protocol   Anemia of Chronic Disease - Hg/Hct remain stable and at pt's baseline - per nephrology service: gave IV feraheme, increased darbo to 100/week; transfused with last HD; started with weekly maintenance iron with HD   EDUCATION - test results and diagnostic studies were discussed with patient  - patient verbalized the understanding - questions were answered at the bedside and contact information was provided for additional questions or concerns   LOS: 9 days   Roy Dyer 07/18/2011, 8:51 PM  [facility] Pager: [phone]

## 2011-07-19 LAB — RENAL FUNCTION PANEL
Albumin: 1.9 g/dL — ABNORMAL LOW (ref 3.5–5.2)
Chloride: 96 mEq/L (ref 96–112)
Creatinine, Ser: 5.28 mg/dL — ABNORMAL HIGH (ref 0.50–1.35)
GFR calc Af Amer: 14 mL/min — ABNORMAL LOW (ref >90)
GFR calc non Af Amer: 12 mL/min — ABNORMAL LOW (ref >90)
Potassium: 3.3 mEq/L — ABNORMAL LOW (ref 3.5–5.1)
Sodium: 133 mEq/L — ABNORMAL LOW (ref 135–145)

## 2011-07-19 LAB — CBC
Platelets: 108 10*3/uL — ABNORMAL LOW (ref 150–400)
RBC: 3.31 MIL/uL — ABNORMAL LOW (ref 4.22–5.81)
RDW: 18 % — ABNORMAL HIGH (ref 11.5–15.5)
WBC: 9.6 10*3/uL (ref 4.0–10.5)

## 2011-07-19 LAB — PROTIME-INR
INR: 2.7 — ABNORMAL HIGH (ref 0.00–1.49)
Prothrombin Time: 29.1 seconds — ABNORMAL HIGH (ref 11.6–15.2)

## 2011-07-19 MED ORDER — MENTHOL 3 MG MT LOZG: 1.0000 | LOZENGE | OROMUCOSAL | Status: DC | PRN

## 2011-07-19 NOTE — Progress Notes (Signed)
Patient ID: Roy Dyer, male   DOB: 04-26-1966, 46 y.o.   MRN: [id number]  Subjective: No events overnight. Patient denies chest pain, shortness of breath, abdominal pain.   Objective:  Vital signs in last 24 hours:  Filed Vitals:   07/19/11 0537 07/19/11 0809 07/19/11 1101 07/19/11 1437  BP: 127/76   118/71  Pulse: 96  98 90  Temp: 98.1 F (36.7 C)   97.5 F (36.4 C)  TempSrc: Oral   Oral  Resp: 18   18  Height:      Weight:      SpO2: 95% 96% 98% 98%    Intake/Output from previous day:   Intake/Output Summary (Last 24 hours) at 07/19/11 2030 Last data filed at 07/19/11 1230  Gross per 24 hour  Intake    600 ml  Output    625 ml  Net    -25 ml    Physical Exam: General: Alert, awake, oriented x3, in no acute distress. HEENT: No bruits, no goiter. Moist mucous membranes, no scleral icterus, no conjunctival pallor. Heart: Regular rate and rhythm, S1/S2 +, no murmurs, rubs, gallops. Lungs: Clear to auscultation bilaterally. No wheezing, no rhonchi, no rales.  Abdomen: Soft, nontender, nondistended, positive bowel sounds. Extremities: No clubbing or cyanosis, trace bilateral LE pitting edema,  positive pedal pulses. Neuro: Grossly nonfocal.  Lab Results:  Basic Metabolic Panel:    Component Value Date/Time   NA 136 07/18/2011 0540   K 3.6 07/18/2011 0540   CL 98 07/18/2011 0540   CO2 25 07/18/2011 0540   BUN 39* 07/18/2011 0540   CREATININE 4.93* 07/18/2011 0540   GLUCOSE 78 07/18/2011 0540   CALCIUM 8.9 07/18/2011 0540   CALCIUM 9.1 04/23/2011 0820   CBC:    Component Value Date/Time   WBC 9.6 07/18/2011 0540   HGB 10.2* 07/18/2011 0540   HCT 29.8* 07/18/2011 0540   PLT 95* 07/18/2011 0540   MCV 90.0 07/18/2011 0540   NEUTROABS 4.8 07/02/2011 1129   LYMPHSABS 2.1 07/02/2011 1129   MONOABS 0.6 07/02/2011 1129   EOSABS 0.4 07/02/2011 1129   BASOSABS 0.1 07/02/2011 1129      Lab 07/18/11 0540 07/17/11 0546 07/16/11 0635 07/15/11 0555 07/14/11 0805  WBC 9.6 9.6 7.8 6.8 7.9    HGB 10.2* 8.1* 8.2* 8.1* 9.0*  HCT 29.8* 22.8* 23.7* 23.1* 25.7*  PLT 95* 108* 93* 77* 92*  MCV 90.0 88.7 89.8 89.9 89.9  MCH 30.8 31.5 31.1 31.5 31.5  MCHC 34.2 35.5 34.6 35.1 35.0  RDW 17.6* 17.6* 17.4* 17.5* 18.0*  LYMPHSABS -- -- -- -- --  MONOABS -- -- -- -- --  EOSABS -- -- -- -- --  BASOSABS -- -- -- -- --  BANDABS -- -- -- -- --    Lab 07/18/11 0540 07/17/11 0546 07/16/11 0635 07/15/11 0555 07/14/11 1157  NA 136 131* 133* 136 136  K 3.6 4.5 3.9 4.2 4.2  CL 98 94* 96 100 100  CO2 25 26 24 25 25   GLUCOSE 78 90 111* 117* 123*  BUN 39* 63* 57* 50* 47*  CREATININE 4.93* 6.81* 6.60* 6.27* 5.61*  CALCIUM 8.9 8.7 8.6 8.9 8.6  MG -- -- -- -- --    Lab 07/19/11 0530 07/18/11 0540 07/17/11 0546 07/16/11 0635 07/15/11 0555  INR 2.70* 2.63* 2.39* 2.28* 1.63*  PROTIME -- -- -- -- --   Studies/Results: No results found.  Medications: Scheduled Meds:   . allopurinol  100 mg Oral Daily  .  amiodarone  200 mg Oral TID  . aspirin EC  81 mg Oral Daily  . atorvastatin  80 mg Oral Daily  . calcium acetate  667 mg Oral TID WC  . clopidogrel  75 mg Oral Q breakfast  . darbepoetin (ARANESP) injection - DIALYSIS  100 mcg Intravenous Q Tue-HD  . esomeprazole  40 mg Oral Daily  . ferric gluconate (FERRLECIT/NULECIT) IV  125 mg Intravenous Q Thu-HD  . fluticasone  1 puff Inhalation BID  . gabapentin  100 mg Oral QHS  . isosorbide mononitrate  30 mg Oral Daily  . metoprolol  50 mg Oral TID  . niacin  1,000 mg Oral QHS  . paricalcitol  1 mcg Intravenous Q T,Th,Sa-HD   Continuous Infusions:  PRN Meds:.sodium chloride, sodium chloride, acetaminophen, acetaminophen, bisacodyl, calcium carbonate (dosed in mg elemental calcium), camphor-menthol, diphenhydrAMINE, docusate sodium, feeding supplement (NEPRO CARB STEADY), feeding supplement (NEPRO CARB STEADY), heparin, heparin, HYDROmorphone, hydrOXYzine, lidocaine, lidocaine-prilocaine, menthol-cetylpyridinium, methocarbamol(ROBAXIN) IV,  methocarbamol, metoCLOPramide (REGLAN) injection, metoCLOPramide nitroGLYCERIN, ondansetron (ZOFRAN) IV, ondansetron, oxyCODONE, oxyCODONE-acetaminophen, pentafluoroprop-tetrafluoroeth, phenol, sorbitol, zolpidem, DISCONTD: menthol-cetylpyridinium  Assessment/Plan:  Principal Problem:  *Avascular necrosis of femoral head Right  - now s/p ORIF  - continue physical therapy and and analgesia for adequate pain control   Active Problems:  ischemic cardiomyopathy status post anterolateral MI  - this appears to be clinically stable  - pt is on Plavix, beta-blocker, and amiodarone  - will continue to follow up on cardiology recommendations   Implantable cardiac defibrillator -[employer]-single-chamber   End stage renal disease  - newly diagnosed  - nephrology service also following  - pt tolerating HD well - plan is to continue with TTS schedule and nephrology service working on arrangements for outpatient schedules   Raynaud's disease  - Nifedipine stopped due to somewhat low BP  - will continue to monitor the vitals per floor protocol   Anemia of Chronic Disease  - Hg/Hct remain stable post transfusion, will obtain CBC in AM - per nephrology service: gave IV feraheme, increased darbo to 100/week; transfused with last HD; started with weekly maintenance iron with HD   EDUCATION  - test results and diagnostic studies were discussed with patient  - patient verbalized the understanding  - questions were answered at the bedside and contact information was provided for additional questions or concerns    LOS: 10 days   MAGICK-Rayshell Goecke 07/19/2011, 8:30 PM  [facility] Pager: [phone]

## 2011-07-19 NOTE — Progress Notes (Signed)
L hip dressing changed per MD order, new ABD dry dressing with tape applied, incision looks great, scant serosang drainage noted, no odor or swelling, pt tolerated very well.  No pain, will continue to monitor closely. Renee Pain

## 2011-07-19 NOTE — Progress Notes (Signed)
Subjective: Interval History:   Objective: None new except transfer to the Hospitalists's service and transfer to telemetry Feels "so much better" Vital signs in last 24 hours:  Temp:  [97 F (36.1 C)-98.7 F (37.1 C)] 98.1 F (36.7 C) (03/07 0537) Pulse Rate:  [96-103] 96  (03/07 0537) Resp:  [16-20] 18  (03/07 0537) BP: (105-127)/(60-76) 127/76 mmHg (03/07 0537) SpO2:  [93 %-97 %] 95 % (03/07 0537) Weight change:   Intake/Output: I/O last 3 completed shifts: In: 840 [P.O.:840] Out: 1875 [Urine:525; Other:1350]  Intake/Output this shift:  Total I/O In: -  Out: 200 [Urine:200] Scheduled Medications Reviewed . allopurinol  100 mg Oral Daily  . amiodarone  200 mg Oral TID  . aspirin EC  81 mg Oral Daily  . atorvastatin  80 mg Oral Daily  . calcium acetate  667 mg Oral TID WC  . clopidogrel  75 mg Oral Q breakfast  . darbepoetin (ARANESP) injection - DIALYSIS  100 mcg Intravenous Q Tue-HD  . esomeprazole  40 mg Oral Daily  . ferric gluconate (FERRLECIT/NULECIT) IV  125 mg Intravenous Q Thu-HD  . fluticasone  1 puff Inhalation BID  . gabapentin  100 mg Oral QHS  . isosorbide mononitrate  30 mg Oral Daily  . metoprolol  50 mg Oral TID  . niacin  1,000 mg Oral QHS  . paricalcitol  1 mcg Intravenous Q T,Th,Sa-HD  . DISCONTD: calcitRIOL  0.25 mcg Oral Daily  . DISCONTD: furosemide  40 mg Oral BID  . DISCONTD: sodium bicarbonate  1,300 mg Oral TID   EXAM: VS as above CVS-Mildy tach around 90  Regular Lungs entirely clear Abdomen soft RLE 1+ edema' hip incision covered Trace LE edema Bruised left AVF but patent Lab Results: (today's labs are pending)  Basename 07/18/11 0540 07/17/11 0546 07/16/11 0635  WBC 9.6 9.6 7.8  HGB 10.2* 8.1* 8.2*  HCT 29.8* 22.8* 23.7*  PLT 95* 108* 93*   BMET  Basename 07/18/11 0540 07/17/11 0546 07/16/11 0635  NA 136 131* 133*  K 3.6 4.5 3.9  CL 98 94* 96  CO2 25 26 24   GLUCOSE 78 90 111*  BUN 39* 63* 57*  CREATININE 4.93* 6.81*  6.60*  CALCIUM 8.9 8.7 8.6  PHOS 4.3 5.8* 5.9*   Basename 07/18/11 0540 07/17/11 0546  LABPROT 28.5* 26.5*  INR 2.63* 2.39*  Assessment/Plan: 1. New ESRD - anticipate TTHSAT schedule second shift at [facility] but this is pending insurance verification by [employer]; For HD today 2. CHF- better after volume removal with HD 3. ABLA/anemia of chronic disease- iron stores low; gave IV feraheme this admit; increased darbo to 100/week; transfused with last HD; started weekly maintenance iron with HD; checking stools 4. AVN s/p Right ORIF- pain control and PT/OT  5. CAD/stents/prev MI/EF35%- currently stable. On plavix , NTG, Betablocker increased and amio added by Dr. Johnsie Cancel  6. HTN- better. 7. Gout decrease allopurinol to 100mg  qd  8. SLE- stable  9. Raynaud's- stopped nifedipine due to soft BP 10. Vasc access- small infiltration 07/13/11, +T/B 11. 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 Once insurance verification and clarification of start date with unit (may have to be here until after HD on Saturday) would be able to d/c from renal perspective  LOS: 10 Maiana Hennigan B @TODAY @5 :57 AM

## 2011-07-19 NOTE — Progress Notes (Signed)
Physical Therapy Treatment Patient Details Name: TADEO HARMELINK MRN: [id number] DOB: Aug 10, 1965 Today's Date: 07/19/2011  PT Assessment/Plan  PT - Assessment/Plan Comments on Treatment Session: Pt s/p RTHA moving very well. Limited by cardiopulmonary status. Baseline he could walk 300' before seated rest secondary to cardiac status. Pt encouraged to continue ambulating with wife with decreased speed. Pt performing HEP independently and encouraged to continue. PT Plan: Discharge plan remains appropriate PT Goals  Acute Rehab PT Goals PT Goal: Sit to Stand - Progress: Met PT Goal: Stand to Sit - Progress: Met PT Goal: Ambulate - Progress: Progressing toward goal PT Goal: Perform Home Exercise Program - Progress: Met Additional Goals PT Goal: Additional Goal #1 - Progress: Met  PT Treatment Precautions/Restrictions  Precautions Precautions: Posterior Hip Precaution Booklet Issued: Yes (comment) Precaution Comments: Pt able to state all precautions Restrictions Weight Bearing Restrictions: Yes RLE Weight Bearing: Weight bearing as tolerated Mobility (including Balance) Bed Mobility Bed Mobility: No Transfers Sit to Stand: 6: Modified independent (Device/Increase time);From chair/3-in-1 Stand to Sit: To chair/3-in-1;6: Modified independent (Device/Increase time) Ambulation/Gait Ambulation/Gait Assistance: 6: Modified independent (Device/Increase time) Ambulation/Gait Assistance Details (indicate cue type and reason): Pt with good gait speed but able to feel increased HR and encouraged to decrease speed and take deep breaths. HR up to 130 and down to 100 once seated. Approximate 2 min recovery before return to room with HR 100 throughout return. Ambulation Distance (Feet): 180 Feet (seated rest after 100') Assistive device: Rolling walker Gait Pattern: Step-through pattern;Decreased step length - right Stairs: No  Posture/Postural Control Posture/Postural Control: No significant  limitations Exercise  Total Joint Exercises Gluteal Sets: AROM;Right;Other reps (comment);Seated (20reps) Hip ABduction/ADduction: AROM;Right;20 reps;Seated Long Arc Quad: AROM;Right;20 reps;Seated End of Session PT - End of Session Equipment Utilized During Treatment: Gait belt Activity Tolerance: Patient tolerated treatment well Patient left: in chair;with call bell in reach;with family/visitor present General Behavior During Session: [facility] for tasks performed Cognition: [facility] for tasks performed  Melford Aase 07/19/2011, 11:07 AM Lanetta Inch, [place]

## 2011-07-19 NOTE — Progress Notes (Signed)
Patient ID: Roy Dyer, male   DOB: 03/20/1966, 46 y.o.   MRN: [id number] @ Subjective:  Better.  HR down less swelling in arms.     Objective:  Filed Vitals:   07/18/11 2022 07/19/11 0537 07/19/11 0809 07/19/11 1101  BP: 108/66 127/76    Pulse: 101 96  98  Temp: 98.7 F (37.1 C) 98.1 F (36.7 C)    TempSrc: Oral Oral    Resp: 16 18    Height:      Weight:      SpO2: 93% 95% 96% 98%    Intake/Output from previous day:  Intake/Output Summary (Last 24 hours) at 07/19/11 1305 Last data filed at 07/19/11 0800  Gross per 24 hour  Intake    240 ml  Output    750 ml  Net   -510 ml    Physical Exam: No distress Lungs clear S/S2 BS positive Neuro nonfocal RUE fistual with thrill Plus 2 PT AICD under left clavicle Swelling RUE  Lab Results: Basic Metabolic Panel:  Basename 07/18/11 0540 07/17/11 0546  NA 136 131*  K 3.6 4.5  CL 98 94*  CO2 25 26  GLUCOSE 78 90  BUN 39* 63*  CREATININE 4.93* 6.81*  CALCIUM 8.9 8.7  MG -- --  PHOS 4.3 5.8*   Liver Function Tests:  Basename 07/18/11 0540 07/17/11 0546  AST -- --  ALT -- --  ALKPHOS -- --  BILITOT -- --  PROT -- --  ALBUMIN 2.0* 2.0*   No results found for this basename: LIPASE:2,AMYLASE:2 in the last 72 hours CBC:  Basename 07/18/11 0540 07/17/11 0546  WBC 9.6 9.6  NEUTROABS -- --  HGB 10.2* 8.1*  HCT 29.8* 22.8*  MCV 90.0 88.7  PLT 95* 108*    No results found.  Cardiac Studies:  Medications:      . allopurinol  100 mg Oral Daily  . amiodarone  200 mg Oral TID  . aspirin EC  81 mg Oral Daily  . atorvastatin  80 mg Oral Daily  . calcium acetate  667 mg Oral TID WC  . clopidogrel  75 mg Oral Q breakfast  . darbepoetin (ARANESP) injection - DIALYSIS  100 mcg Intravenous Q Tue-HD  . esomeprazole  40 mg Oral Daily  . ferric gluconate (FERRLECIT/NULECIT) IV  125 mg Intravenous Q Thu-HD  . fluticasone  1 puff Inhalation BID  . gabapentin  100 mg Oral QHS  . isosorbide mononitrate  30 mg  Oral Daily  . metoprolol  50 mg Oral TID  . niacin  1,000 mg Oral QHS  . paricalcitol  1 mcg Intravenous Q T,Th,Sa-HD       Assessment/Plan:  CAD:  3 stents in all major vessels.  Anterior MI 3/12 with DES to LAD.  AICD.  No angina resume ASA and Plavix CRF:  Discussed with Dr Lorrene Reid  Dialysis to ensue T/Th/Saturday.  Agree with this Anemia:  Improved with transfusions  Continue Aranesp  And iron Written order for transfusion since primary service has not.   CHF:  EF 35% with CAD.  Control volume with dialysis Tachychardia: Tele shows NSR with PAC;s improved with amiodarone and higher dose lopresser  [name] 07/19/2011, 1:05 PM

## 2011-07-20 LAB — RENAL FUNCTION PANEL
Calcium: 8.5 mg/dL (ref 8.4–10.5)
GFR calc Af Amer: 24 mL/min — ABNORMAL LOW (ref >90)
GFR calc non Af Amer: 21 mL/min — ABNORMAL LOW (ref >90)
Phosphorus: 2.2 mg/dL — ABNORMAL LOW (ref 2.3–4.6)
Potassium: 4 mEq/L (ref 3.5–5.1)
Sodium: 140 mEq/L (ref 135–145)

## 2011-07-20 LAB — CBC
MCHC: 34 g/dL (ref 30.0–36.0)
Platelets: 108 10*3/uL — ABNORMAL LOW (ref 150–400)
RDW: 18.5 % — ABNORMAL HIGH (ref 11.5–15.5)
WBC: 11.4 10*3/uL — ABNORMAL HIGH (ref 4.0–10.5)

## 2011-07-20 LAB — PROTIME-INR: INR: 2.39 — ABNORMAL HIGH (ref 0.00–1.49)

## 2011-07-20 MED ORDER — ATORVASTATIN CALCIUM 80 MG PO TABS: 80.0000 mg | ORAL_TABLET | Freq: Every day | ORAL | Status: DC

## 2011-07-20 MED ORDER — MENTHOL 3 MG MT LOZG
1.0000 | LOZENGE | OROMUCOSAL | Status: DC | PRN
Start: 2011-07-20 — End: 2011-07-20
  Filled 2011-07-20: qty 9

## 2011-07-20 MED ORDER — DEXLANSOPRAZOLE 60 MG PO CPDR: 60.0000 mg | DELAYED_RELEASE_CAPSULE | Freq: Every day | ORAL | Status: DC

## 2011-07-20 MED ORDER — AMIODARONE HCL 200 MG PO TABS: 200.0000 mg | ORAL_TABLET | Freq: Three times a day (TID) | ORAL | Status: DC

## 2011-07-20 MED ORDER — DIPHENHYDRAMINE HCL 25 MG PO TABS: 25.0000 mg | ORAL_TABLET | Freq: Four times a day (QID) | ORAL | Status: AC | PRN

## 2011-07-20 MED ORDER — CLOPIDOGREL BISULFATE 75 MG PO TABS: 75.0000 mg | ORAL_TABLET | Freq: Every day | ORAL | Status: DC

## 2011-07-20 MED ORDER — METOPROLOL TARTRATE 100 MG PO TABS: 100.0000 mg | ORAL_TABLET | Freq: Two times a day (BID) | ORAL | Status: DC

## 2011-07-20 MED ORDER — ALLOPURINOL 300 MG PO TABS: 300.0000 mg | ORAL_TABLET | Freq: Every day | ORAL | Status: DC

## 2011-07-20 MED ORDER — NIFEDIPINE 10 MG PO CAPS: 10.0000 mg | ORAL_CAPSULE | Freq: Three times a day (TID) | ORAL | Status: DC

## 2011-07-20 MED ORDER — NIACIN ER (ANTIHYPERLIPIDEMIC) 500 MG PO TBCR: 1000.0000 mg | EXTENDED_RELEASE_TABLET | Freq: Every day | ORAL | Status: DC

## 2011-07-20 MED ORDER — FUROSEMIDE 40 MG PO TABS: 40.0000 mg | ORAL_TABLET | Freq: Every day | ORAL | Status: DC | PRN

## 2011-07-20 MED ORDER — ZOLPIDEM TARTRATE 5 MG PO TABS: 5.0000 mg | ORAL_TABLET | Freq: Every evening | ORAL | Status: DC | PRN

## 2011-07-20 MED ORDER — ASPIRIN 81 MG PO TABS: 81.0000 mg | ORAL_TABLET | Freq: Every day | ORAL | Status: DC

## 2011-07-20 MED ORDER — GABAPENTIN 100 MG PO CAPS: 100.0000 mg | ORAL_CAPSULE | Freq: Every day | ORAL | Status: DC

## 2011-07-20 MED ORDER — CALCIUM CARBONATE 1250 MG/5ML PO SUSP: 500.0000 mg | Freq: Four times a day (QID) | ORAL | Status: DC | PRN

## 2011-07-20 MED ORDER — CALCIUM ACETATE 667 MG PO CAPS: 667.0000 mg | ORAL_CAPSULE | Freq: Three times a day (TID) | ORAL | Status: DC

## 2011-07-20 MED ORDER — CALCITRIOL 0.25 MCG PO CAPS: 0.2500 ug | ORAL_CAPSULE | Freq: Every day | ORAL | Status: DC

## 2011-07-20 MED ORDER — FLUTICASONE PROPIONATE HFA 44 MCG/ACT IN AERO: 1.0000 | INHALATION_SPRAY | Freq: Two times a day (BID) | RESPIRATORY_TRACT | Status: DC

## 2011-07-20 MED ORDER — ONDANSETRON HCL 4 MG/2ML IJ SOLN
INTRAMUSCULAR | Status: AC
  Administered 2011-07-20: 4 mg via INTRAVENOUS
  Filled 2011-07-20: qty 2

## 2011-07-20 MED ORDER — OXYCODONE HCL 5 MG PO TABS: 5.0000 mg | ORAL_TABLET | ORAL | Status: AC | PRN

## 2011-07-20 MED ORDER — ESOMEPRAZOLE MAGNESIUM 40 MG PO CPDR: 40.0000 mg | DELAYED_RELEASE_CAPSULE | Freq: Every day | ORAL | Status: DC

## 2011-07-20 MED ORDER — ISOSORBIDE MONONITRATE ER 30 MG PO TB24: 30.0000 mg | ORAL_TABLET | Freq: Every day | ORAL | Status: DC

## 2011-07-20 MED ORDER — NITROGLYCERIN 0.4 MG SL SUBL: 0.4000 mg | SUBLINGUAL_TABLET | SUBLINGUAL | Status: DC | PRN

## 2011-07-20 MED ORDER — PARICALCITOL 5 MCG/ML IV SOLN
INTRAVENOUS | Status: AC
  Administered 2011-07-20: 1 ug via INTRAVENOUS
  Filled 2011-07-20: qty 1

## 2011-07-20 MED ORDER — TRAMADOL HCL 50 MG PO TABS: 50.0000 mg | ORAL_TABLET | Freq: Four times a day (QID) | ORAL | Status: DC | PRN

## 2011-07-20 NOTE — Progress Notes (Addendum)
Subjective: Interval History:  Had HD lat last PM - just finished up after 3 this morning Not sob No CP Hip about same Objective:  Vital signs in last 24 hours:  Temp:  [97 F (36.1 C)-98.4 F (36.9 C)] 98.4 F (36.9 C) (03/08 0416) Pulse Rate:  [85-107] 107  (03/08 0416) Resp:  [16-25] 20  (03/08 0416) BP: (107-129)/(66-81) 127/81 mmHg (03/08 0416) SpO2:  [95 %-98 %] 96 % (03/08 0416) Weight:  [93.7 kg (206 lb 9.1 oz)-95.7 kg (210 lb 15.7 oz)] 93.7 kg (206 lb 9.1 oz) (03/08 0200) Weight change:     Lungs clear AVF bruised but patent No edema RLS, minimal LLE Lab Results:  Basename 07/20/11 0600 07/19/11 2243 07/18/11 0540  WBC 11.4* 9.6 9.6  HGB 11.2* 10.4* 10.2*  HCT 32.9* 30.1* 29.8*  PLT 108* 108* 95*   BMET  Basename 07/20/11 0600 07/19/11 2243 07/18/11 0540  NA 140 133* 136  K 4.0 3.3* 3.6  CL 103 96 98  CO2 30 23 25   GLUCOSE 98 89 78  BUN 22 45* 39*  CREATININE 3.39* 5.28* 4.93*  CALCIUM 8.5 8.3* 8.9  PHOS 2.2* 3.8 4.3   LFT  Basename 07/20/11 0600  PROT --  ALBUMIN 1.9*  AST --  ALT --  ALKPHOS --  BILITOT --  BILIDIR --  IBILI --   PT/INR  Basename 07/20/11 0600 07/19/11 0530  LABPROT 26.5* 29.1*  INR 2.39* 2.70*  PTH: Lab Results  Component Value Date   PTH 85.8* 04/23/2011   CALCIUM 8.5 07/20/2011   CAION 1.19 11/20/2010   PHOS 2.2* 07/20/2011  Scheduled meds reviewed:  . allopurinol  100 mg Oral Daily  . amiodarone  200 mg Oral TID  . aspirin EC  81 mg Oral Daily  . atorvastatin  80 mg Oral Daily  . calcium acetate  667 mg Oral TID WC  . clopidogrel  75 mg Oral Q breakfast  . darbepoetin (ARANESP) injection - DIALYSIS  100 mcg Intravenous Q Tue-HD  . esomeprazole  40 mg Oral Daily  . ferric gluconate (FERRLECIT/NULECIT) IV  125 mg Intravenous Q Thu-HD  . fluticasone  1 puff Inhalation BID  . gabapentin  100 mg Oral QHS  . isosorbide mononitrate  30 mg Oral Daily  . metoprolol  50 mg Oral TID  . niacin  1,000 mg Oral QHS  .  paricalcitol  1 mcg Intravenous Q T,Th,Sa-HD   Assessment/Plan:  1. New ESRD -  TTHSAT schedule second shift at [facility] pending insurance verification by [employer]; For HD Saturday AM then could be discharged home afterwards See addendum below - patient can start outpt HD tomorrow. 2. CHF- better after volume removal with HD 3. ABLA/anemia of chronic disease- s/p total 5 units PRBC's; on Aranesp and weekly iron with HD 4. AVN s/p Right ORIF- pain control and PT/OT  5. CAD/stents in all 23 major vessels/prev MI/EF35%/ICD - currently stable. On plavix , NTG, Betablocker increased and amioadded; followed by Dr. Johnsie Cancel.   6. HTN- good. 7. Gout allopurinol to 100mg  qd  8. SLE- stable  9. Raynaud's- stopped nifedipine due to soft BP 10. Vasc access- small infiltration 07/13/11, +T/B; worked well next 2 treatments 11. 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 We will relay HD orders to the unit. Naif Alabi B

## 2011-07-20 NOTE — Progress Notes (Signed)
PATIENT ID: Roy Dyer  MRN: [id number]  DOB/AGE:  [birth year] / 45 y.o.  11 Days Post-Op Procedure(s) (LRB): TOTAL HIP ARTHROPLASTY (Right)    PROGRESS NOTE Subjective: Patient is alert, oriented,noNausea, no Vomiting, yes passing gas, yes Bowel Movement. Taking PO well. Denies SOB, Chest or Calf Pain. Using Incentive Spirometer, PAS in place. Ambulating well with PT. Patient reports pain as mild  .    Objective: Vital signs in last 24 hours: Filed Vitals:   07/20/11 0130 07/20/11 0200 07/20/11 0416 07/20/11 0747  BP: 116/67 122/75 127/81   Pulse: 100 103 107   Temp:  97 F (36.1 C) 98.4 F (36.9 C)   TempSrc:  Oral Oral   Resp: 25 22 20    Height:      Weight:  93.7 kg (206 lb 9.1 oz)    SpO2:   96% 96%      Intake/Output from previous day: I/O last 3 completed shifts: In: 600 [P.O.:600] Out: [id number] [Urine:1025; Other:2500]   Intake/Output this shift:     LABORATORY DATA:  Basename 07/20/11 0600 07/19/11 2243 07/19/11 0530  WBC 11.4* 9.6 --  HGB 11.2* 10.4* --  HCT 32.9* 30.1* --  PLT 108* 108* --  NA 140 133* --  K 4.0 3.3* --  CL 103 96 --  CO2 30 23 --  BUN 22 45* --  CREATININE 3.39* 5.28* --  GLUCOSE 98 89 --  GLUCAP -- -- --  INR 2.39* -- 2.70*  CALCIUM 8.5 -- --    Examination: Neurologically intact ABD soft Neurovascular intact Sensation intact distally Intact pulses distally Incision: dressing C/D/I} XR AP&Lat of hip shows well placed\fixed THA  Assessment:   11 Days Post-Op Procedure(s) (LRB): TOTAL HIP ARTHROPLASTY (Right) ADDITIONAL DIAGNOSIS:  Acute Blood Loss Anemia and Renal Insufficiency Chronic  Plan: PT/OT WBAT, THA  posterior precautions  Sutures out today.  DISCHARGE PLAN: Home today.  F/U with Dr. Mayer Camel in 1-2 weeks.  DISCHARGE NEEDS: HHPT, HHRN, Walker and 3-in-1 comode seat

## 2011-07-20 NOTE — Progress Notes (Signed)
Pt. Discharged 07/20/2011  1:11 PM Discharge instructions reviewed with patient/family. Patient/family verbalized understanding. All Rx's given. Questions answered as needed. Pt. Discharged to home with family/self.  Roy Dyer

## 2011-07-20 NOTE — Discharge Instructions (Signed)
Home Health to be provided by [phone] Failure Heart failure (HF) is a condition in which the heart has trouble pumping blood. This means your heart does not pump blood efficiently for your body to work well. In some cases of HF, fluid may back up into your lungs or you may have swelling (edema) in your lower legs. HF is a long-term (chronic) condition. It is important for you to take good care of yourself and follow your caregiver's treatment plan. CAUSES   Health conditions:   High blood pressure (hypertension) causes the heart muscle to work harder than normal. When pressure in the blood vessels is high, the heart needs to pump (contract) with more force in order to circulate blood throughout the body. High blood pressure eventually causes the heart to become stiff and weak.   Coronary artery disease (CAD) is the buildup of cholesterol and fat (plaques) in the arteries of the heart. The blockage in the arteries deprives the heart muscle of oxygen and blood. This can cause chest pain and may lead to a heart attack. High blood pressure can also contribute to CAD.   Heart attack (myocardial infarction) occurs when 1 or more arteries in the heart become blocked. The loss of oxygen damages the muscle tissue of the heart. When this happens, part of the heart muscle dies. The injured tissue does not contract as well and weakens the heart's ability to pump blood.   Abnormal heart valves can cause HF when the heart valves do not open and close properly. This makes the heart muscle pump harder to keep the blood flowing.   Heart muscle disease (cardiomyopathy or myocarditis) is damage to the heart muscle from a variety of causes. These can include drug or alcohol abuse, infections, or unknown reasons. These can increase the risk of HF.   Lung disease makes the heart work harder because the lungs do not work properly. This can cause a strain on the heart leading it to fail.    Diabetes increases the risk of HF. High blood sugar contributes to high fat (lipid) levels in the blood. Diabetes can also cause slow damage to tiny blood vessels that carry important nutrients to the heart muscle. When the heart does not get enough oxygen and food, it can cause the heart to become weak and stiff. This leads to a heart that does not contract efficiently.   Other diseases can contribute to HF. These include abnormal heart rhythms, thyroid problems, and low blood counts (anemia).   Unhealthy lifestyle habits:   Obesity.   Smoking.   Eating foods high in fat and cholesterol.   Eating or drinking beverages high in salt.   Drug or alcohol abuse.   Lack of exercise.  SYMPTOMS  HF symptoms may vary and can be hard to detect. Symptoms may include:  Shortness of breath with activity, such as climbing stairs.   Persistent cough.   Swelling of the feet, ankles, legs, or abdomen.   Unexplained weight gain.   Difficulty breathing when lying flat.   Waking from sleep because of the need to sit up and get more air.   Rapid heartbeat.   Fatigue and loss of energy.   Feeling lightheaded or close to fainting.  DIAGNOSIS  A diagnosis of HF is based on your history, symptoms, physical examination, and diagnostic tests. Diagnostic tests for HF may include:  EKG.   Chest X-ray.   Blood tests.   Exercise stress test.  Blood oxygen test (arterial blood gas).   Evaluation by a heart doctor (cardiologist).   Ultrasound evaluation of the heart (echocardiogram).   Heart artery test to look for blockages (angiogram).   Radioactive imaging to look at the heart (radionuclide test).  TREATMENT  Treatment is aimed at managing the symptoms of HF. Medicines, lifestyle changes, or surgical intervention may be necessary to treat HF.  Medicines to help treat HF may include:   Angiotensin-converting enzyme (ACE) inhibitors. These block the effects of a blood protein  called angiotensin-converting enzyme. ACE inhibitors relax (dilate) the blood vessels and help lower blood pressure. This decreases the workload of the heart, slows the progression of HF, and improves symptoms.   Angiotensin receptor blockers (ARBs). These medications work similar to ACE inhibitors. ARBs may be an alternative for people who cannot tolerate an ACE inhibitor.   Aldosterone antagonists. This medication helps get rid of extra fluid from your body. This lowers the volume of blood the heart has to pump.   Water pills (diuretics). Diuretics cause the kidneys to remove salt and water from the blood. The extra fluid is removed by urination. By removing extra fluid from the body, diuretics help lower the workload of the heart and help prevent fluid buildup in the lungs so breathing is easier.   Beta blockers. These prevent the heart from beating too fast and improve heart muscle strength. Beta blockers help maintain a normal heart rate, control blood pressure, and improve HF symptoms.   Digitalis. This increases the force of the heartbeat and may be helpful to people with HF or heart rhythm problems.   Healthy lifestyle changes include:   Stopping smoking.   Eating a healthy diet. Avoid foods high in fat. Avoid foods fried in oil or made with fat. A dietician can help with healthy food choices.   Limiting how much salt you eat.   Limiting alcohol intake to no more than 1 drink per day for women and 2 drinks per day for men. Drinking more than that is harmful to your heart. If your heart has already been damaged by alcohol or you have severe HF, drinking alcohol should be stopped completely.   Exercising as directed by your caregiver.   Surgical treatment for HF may include:   Procedures to open blocked arteries, repair damaged heart valves, or remove damaged heart muscle tissue.   A pacemaker to help heart muscle function and to control certain abnormal heart rhythms.   A  defibrillator to possibly prevent sudden cardiac death.  HOME CARE INSTRUCTIONS   Activity level. Your caregiver can help you determine what type of exercise program may be helpful. It is important to maintain your strength. Pace your physical activity to avoid shortness of breath or chest pain. Rest for 1 hour before and after meals. A cardiac rehabilitation program may be helpful to some people with HF.   Diet. Eat a heart healthy diet. Food choices should be low in saturated fat and cholesterol. Talk to a dietician to learn about heart healthy foods.   Salt intake. When you have HF, you need to limit the amount of salt you eat. Eat less than 1500 milligrams (mg) of salt per day or as recommended by your caregiver.   Weight monitoring. Weigh yourself every day. You should weigh yourself in the morning after you urinate and before you eat breakfast. Wear the same amount of clothing each time you weigh yourself. Record your weight daily. Bring your recorded weights to  your clinic visits. Tell your caregiver right away if you have gained 3 lb/1.4 kg in 1 day, or 5 lb/2.3 kg in a week or whatever amount you were told to report.   Blood pressure monitoring. This should be done as directed by your caregiver. A home blood pressure cuff can be purchased at a drugstore. Record your blood pressure numbers and bring them to your clinic visits. Tell your caregiver if you become dizzy or lightheaded upon standing up.   Smoking. If you are currently a smoker, it is time to quit. Nicotine makes your heart work harder by causing your blood vessels to constrict. Do not use nicotine gum or patches before talking to your caregiver.   Follow up. Be sure to schedule a follow-up visit with your caregiver. Keep all your appointments.  SEEK MEDICAL CARE IF:   Your weight increases by 3 lb/1.4 kg in 1 day or 5 lb/2.3 kg in a week.   You notice increasing shortness of breath that is unusual for you. This may happen  during rest, sleep, or with activity.   You cough more than normal, especially with physical activity.   You notice more swelling in your hands, feet, ankles, or belly (abdomen).   You are unable to sleep because it is hard to breathe.   You cough up bloody mucus (sputum).   You begin to feel "jumping" or "fluttering" sensations (palpitations) in your chest.  SEEK IMMEDIATE MEDICAL CARE IF:   You have severe chest pain or pressure which may include symptoms such as:   Pain or pressure in the arms, neck, jaw, or back.   Feeling sweaty.   Feeling sick to your stomach (nauseous).   Feeling short of breath while at rest.   Having a fast or irregular heartbeat.   You experience stroke symptoms. These symptoms include:   Facial weakness or numbness.   Weakness or numbness in an arm, leg, or on one side of your body.   Blurred vision.   Difficulty talking or thinking.   Dizziness or fainting.   Severe headache.  THESE ARE MEDICAL EMERGENCIES. Do not wait to see if the symptoms go away. Call your local emergency services (911 in [place] DO NOT drive yourself to the hospital. IMPORTANT  Make a list of every medicine, vitamin, or herbal supplement you are taking. Keep the list with you at all times. Show it to your caregiver at every visit. Keep the list up-to-date.   Ask your caregiver or pharmacist to write an explanation of each medicine you are taking. This should include:   Why you are taking it.   The possible side effects.   The best time of day to take it.   Foods to take with it or what foods to avoid.   When to stop taking it.  MAKE SURE YOU:   Understand these instructions.   Will watch your condition.   Will get help right away if you are not doing well or get worse.  Document Released: 04/30/2005 Document Revised: 04/19/2011 Document Reviewed: 08/12/2009 [facility] Patient Information 2012 [place].

## 2011-07-20 NOTE — Discharge Summary (Signed)
Patient ID: Roy Dyer MRN: [id number] DOB/AGE: 12-20-1965 46 y.o.  Admit date: 07/09/2011 Discharge date: 07/20/2011  Primary Care Physician:  Roy Sou, MD, MD  Discharge Diagnoses:    Present on Admission:  .Avascular necrosis of femoral head Right .ischemic cardiomyopathy status post anterolateral MI .LUPUS .End stage renal disease .implantable cardiac defibrillator -Medtronic-single-chamber .Raynaud's disease .Mixed hyperlipidemia  Principal Problem:  *Avascular necrosis of femoral head Right Active Problems:  Mixed hyperlipidemia  ischemic cardiomyopathy status post anterolateral MI  LUPUS  implantable cardiac defibrillator -Medtronic-single-chamber  End stage renal disease  Raynaud's disease   Medication List  As of 07/20/2011 11:38 AM   ASK your doctor about these medications         allopurinol 300 MG tablet   Commonly known as: ZYLOPRIM   Take 300 mg by mouth daily.      aspirin 81 MG tablet   Take 1 tablet (81 mg total) by mouth daily.      atorvastatin 80 MG tablet   Commonly known as: LIPITOR   Take 80 mg by mouth daily.      calcitRIOL 0.25 MCG capsule   Commonly known as: ROCALTROL   Take 0.25 mcg by mouth daily.      clopidogrel 75 MG tablet   Commonly known as: PLAVIX   Take 75 mg by mouth daily.      dexlansoprazole 60 MG capsule   Commonly known as: DEXILANT   Take 60 mg by mouth daily.      esomeprazole 40 MG capsule   Commonly known as: NEXIUM   Take 40 mg by mouth daily before breakfast.      fluticasone 44 MCG/ACT inhaler   Commonly known as: FLOVENT HFA   Inhale 1 puff into the lungs 2 (two) times daily.      furosemide 40 MG tablet   Commonly known as: LASIX   Take 40 mg by mouth daily as needed. For fluid.      isosorbide mononitrate 30 MG 24 hr tablet   Commonly known as: IMDUR   Take 30 mg by mouth daily.      metoprolol 100 MG tablet   Commonly known as: LOPRESSOR   Take 100 mg by mouth 2 (two) times daily.        niacin 500 MG CR tablet   Commonly known as: NIASPAN   Take 1,000 mg by mouth at bedtime.      NIFEdipine 10 MG capsule   Commonly known as: PROCARDIA   Take 10 mg by mouth 3 (three) times daily.      nitroGLYCERIN 0.4 MG SL tablet   Commonly known as: NITROSTAT   Place 0.4 mg under the tongue every 5 (five) minutes as needed. For chest pains      oxyCODONE-acetaminophen 5-500 MG per capsule   Commonly known as: TYLOX   Take 1 tablet by mouth Every 6 hours as needed. For pain      traMADol 50 MG tablet   Commonly known as: ULTRAM   Take 1 tablet by mouth Every 6 hours as needed. For pain            Disposition and Follow-up: Pt will need to follow up with PCP in 2-4 weeks post discharge. He will continue HD in an outpatient setting as this was already arranged for him by nephrology team at [place]: nephrology and cardiology, orthopedic surgery  Significant Diagnostic Studies:  CXR 07/13/2011: IMPRESSION: Cardiomegaly without pulmonary edema.  Brief H and P: Roy Dyer is a 46 y.o. male, Lupus nephritis, ESRD now on dialysis, CAD status post stents in all 3 major vessels last one being in April 2012, Ischemic cardiomyopathy and EF 35% patient has the AICD, gout, status post left hip partial arthroplasty in the past, who was now admitted for avascular necrosis of right hip status post right hip total replacement done 8 days ago by orthopedist Dr. Mayer Camel, patient is now requiring dialysis and is unable to be discharged home, today. [facility] team was called by the orthopedics team to consult on the patient and take over the care of the patient. Pt denied symptoms such as chest pain, SOB, abdominal or urinary concerns and was in HD on admission, reported tolerating HD well.   Patient is currently in the dialysis unit getting dialyzed hysterectomy symptom-free he has mild nausea and few hiccups which are in his uremic symptoms, he also complains of bilateral arm tingling  which has been going on since his surgery 8 days ago. Denies any weakness denies any other symptomatic complaints.  Physical Exam on Discharge:  Filed Vitals:   07/20/11 0130 07/20/11 0200 07/20/11 0416 07/20/11 0747  BP: 116/67 122/75 127/81   Pulse: 100 103 107   Temp:  97 F (36.1 C) 98.4 F (36.9 C)   TempSrc:  Oral Oral   Resp: 25 22 20    Height:      Weight:  93.7 kg (206 lb 9.1 oz)    SpO2:   96% 96%     Intake/Output Summary (Last 24 hours) at 07/20/11 1138 Last data filed at 07/20/11 0200  Gross per 24 hour  Intake    360 ml  Output   2900 ml  Net  -2540 ml    General: Alert, awake, oriented x3, in no acute distress. HEENT: No bruits, no goiter. Heart: Regular rate and rhythm, without murmurs, rubs, gallops. Lungs: Clear to auscultation bilaterally.AICD under left clavicle Abdomen: Soft, nontender, nondistended, positive bowel sounds. Extremities: No clubbing cyanosis or edema with positive pedal pulses. RUE fistula with good thrill Neuro: Grossly intact, nonfocal.  CBC:    Component Value Date/Time   WBC 11.4* 07/20/2011 0600   HGB 11.2* 07/20/2011 0600   HCT 32.9* 07/20/2011 0600   PLT 108* 07/20/2011 0600   MCV 92.4 07/20/2011 0600   NEUTROABS 4.8 07/02/2011 1129   LYMPHSABS 2.1 07/02/2011 1129   MONOABS 0.6 07/02/2011 1129   EOSABS 0.4 07/02/2011 1129   BASOSABS 0.1 07/02/2011 [id number]    Basic Metabolic Panel:    Component Value Date/Time   NA 140 07/20/2011 0600   K 4.0 07/20/2011 0600   CL 103 07/20/2011 0600   CO2 30 07/20/2011 0600   BUN 22 07/20/2011 0600   CREATININE 3.39* 07/20/2011 0600   GLUCOSE 98 07/20/2011 0600   CALCIUM 8.5 07/20/2011 0600   CALCIUM 9.1 04/23/2011 0820    Hospital Course:  Principal Problem:  *Avascular necrosis of femoral head Right  - now s/p ORIF and today on date of discharge he is 11 days post op - surgery has seen the patient, staples were removed from the surgical site - pt will continue physical therapy in an outpatient  setting  Active Problems:  ischemic cardiomyopathy status post anterolateral MI  - this has remained clinically stable throughout the hospitalization - cardiology service has been following the patient - pt is on Plavix, beta-blocker, and amiodarone   Implantable cardiac defibrillator -Medtronic-single-chamber   End stage renal disease  -  newly diagnosed  - nephrology service was also following  - pt tolerating HD well  - plan is to continue with TTS schedule and nephrology service made arrangements for outpatient schedules   Raynaud's disease  - Nifedipine stopped due to somewhat low BP during the hospitalization but can be resumed upon discharge since BP tolerating  Anemia of Chronic Disease  - Hg/Hct remained stable post transfusion - per nephrology service: gave IV feraheme, increased darbo to 100/week; transfused with last HD; started with weekly maintenance iron with HD  - pt had 2 units of PRBC transfused  EDUCATION  - test results and diagnostic studies were discussed with patient  - patient verbalized the understanding  - questions were answered at the bedside and contact information was provided for additional questions or concerns   Time spent on Discharge: Over 30 minutes  Signed: Faye Ramsay 07/20/2011, 11:38 AM  [facility]  [phone]

## 2011-07-20 NOTE — Progress Notes (Signed)
Patient ID: Roy Dyer, male   DOB: 1965-09-07, 46 y.o.   MRN: [id number] @ Subjective:  Better.  HR down less swelling in arms.  Wants to go home.  Outpatient dialysis arranged for Saturday   Objective:  Filed Vitals:   07/20/11 0130 07/20/11 0200 07/20/11 0416 07/20/11 0747  BP: 116/67 122/75 127/81   Pulse: 100 103 107   Temp:  97 F (36.1 C) 98.4 F (36.9 C)   TempSrc:  Oral Oral   Resp: 25 22 20    Height:      Weight:  93.7 kg (206 lb 9.1 oz)    SpO2:   96% 96%    Intake/Output from previous day:  Intake/Output Summary (Last 24 hours) at 07/20/11 1056 Last data filed at 07/20/11 0200  Gross per 24 hour  Intake    360 ml  Output   2900 ml  Net  -2540 ml    Physical Exam: No distress Lungs clear S/S2 BS positive Neuro nonfocal RUE fistual with thrill Plus 2 PT AICD under left clavicle Swelling RUE  Lab Results: Basic Metabolic Panel:  Basename 07/20/11 0600 07/19/11 2243  NA 140 133*  K 4.0 3.3*  CL 103 96  CO2 30 23  GLUCOSE 98 89  BUN 22 45*  CREATININE 3.39* 5.28*  CALCIUM 8.5 8.3*  MG -- --  PHOS 2.2* 3.8   Liver Function Tests:  Basename 07/20/11 0600 07/19/11 2243  AST -- --  ALT -- --  ALKPHOS -- --  BILITOT -- --  PROT -- --  ALBUMIN 1.9* 1.9*   No results found for this basename: LIPASE:2,AMYLASE:2 in the last 72 hours CBC:  Basename 07/20/11 0600 07/19/11 2243  WBC 11.4* 9.6  NEUTROABS -- --  HGB 11.2* 10.4*  HCT 32.9* 30.1*  MCV 92.4 90.9  PLT 108* 108*    No results found.  Cardiac Studies:  Medications:      . allopurinol  100 mg Oral Daily  . amiodarone  200 mg Oral TID  . aspirin EC  81 mg Oral Daily  . atorvastatin  80 mg Oral Daily  . calcium acetate  667 mg Oral TID WC  . clopidogrel  75 mg Oral Q breakfast  . darbepoetin (ARANESP) injection - DIALYSIS  100 mcg Intravenous Q Tue-HD  . esomeprazole  40 mg Oral Daily  . ferric gluconate (FERRLECIT/NULECIT) IV  125 mg Intravenous Q Thu-HD  . fluticasone   1 puff Inhalation BID  . gabapentin  100 mg Oral QHS  . isosorbide mononitrate  30 mg Oral Daily  . metoprolol  50 mg Oral TID  . niacin  1,000 mg Oral QHS  . paricalcitol  1 mcg Intravenous Q T,Th,Sa-HD       Assessment/Plan:  CAD:  3 stents in all major vessels.  Anterior MI 3/12 with DES to LAD.  AICD.  No angina ASA and Plavix on board CRF:  Discussed with Dr Lorrene Reid  Dialysis to ensue T/Th/Saturday.  Agree with this fistula working well Anemia:  Improved with transfusions  Continue Aranesp  And iron Written order for transfusion since primary service has not.   CHF:  EF 35% with CAD.  Control volume with dialysis Tachychardia: Much improved back on his beta blocker and with anemia improved  Ok to D/C He has F/U with me in April  Joleah Kosak 07/20/2011, 10:56 AM

## 2011-07-23 LAB — PARATHYROID HORMONE, INTACT (NO CA): PTH: 47.4 pg/mL (ref 14.0–72.0)

## 2011-07-30 ENCOUNTER — Ambulatory Visit (INDEPENDENT_AMBULATORY_CARE_PROVIDER_SITE_OTHER): Payer: Managed Care, Other (non HMO) | Admitting: Family Medicine

## 2011-07-30 ENCOUNTER — Encounter: Payer: Self-pay | Admitting: Family Medicine

## 2011-07-30 VITALS — BP 100/67 | HR 71 | Temp 97.4°F | Ht 67.0 in | Wt 200.0 lb

## 2011-07-30 DIAGNOSIS — I2589 Other forms of chronic ischemic heart disease: Secondary | ICD-10-CM

## 2011-07-30 DIAGNOSIS — Z96649 Presence of unspecified artificial hip joint: Secondary | ICD-10-CM

## 2011-07-30 DIAGNOSIS — G5623 Lesion of ulnar nerve, bilateral upper limbs: Secondary | ICD-10-CM | POA: Insufficient documentation

## 2011-07-30 DIAGNOSIS — Z96641 Presence of right artificial hip joint: Secondary | ICD-10-CM | POA: Insufficient documentation

## 2011-07-30 DIAGNOSIS — G5620 Lesion of ulnar nerve, unspecified upper limb: Secondary | ICD-10-CM

## 2011-07-30 DIAGNOSIS — N186 End stage renal disease: Secondary | ICD-10-CM

## 2011-07-30 DIAGNOSIS — I255 Ischemic cardiomyopathy: Secondary | ICD-10-CM

## 2011-07-30 DIAGNOSIS — Z23 Encounter for immunization: Secondary | ICD-10-CM

## 2011-07-30 NOTE — Progress Notes (Signed)
OFFICE VISIT  07/30/2011   CC:  Chief Complaint  Patient presents with  . Follow-up    hospital follow up, dialysis     HPI:    Patient is a 46 y.o. Caucasian male who presents for hospital f/u s/p right total hip replacement (hosp 07/09/11-07/20/11).  Here with his wife today.  I reviewed his hospital records today.  Hip replacement went fine. In hospital he required transfusion of multiple U of pRBCs and began hemodialysis on a regular basis b/c his renal function finally bottomed out and did not recover.  He still urinates in small amounts. Is tolerating dialysis via left arm fistula t/th/sat, has had a total of 4 outpt HD sessions so far.  Gets labs there and seen by MD q Tuesday.  His appetite is slow to return but he is eating some anyway.  Energy level increasing gradually, getting in-home PT.  Home health nurse visits but he asks if he can d/c this b/c "she's not doing anything really". No pain med requirement in over a week.  He saw ortho for mild wound dehiscence recently and although pt says ortho was not excessively concerned with infxn, he was started on keflex.   No fevers.  The wound is now healed up, edges approximated, no drainage. BP's have been low-normal range.  No presyncope or syncope.  He has been placed on 32 oz fluid/day limit. Has some nights of orthopnea, cough, frothy/blood laced sputum, SOB--has not had to go to the ED. CXR obtained by renal per pt was normal.  He has certainly found that his response to prn lasix is markedly diminished in it's ability to put any urine out and help the periods of dyspnea like they had in the past. Denies chect pain, nausea, or diaphoresis.    Also says that since waking up from anesthesia from surgery he has noted numbness/tingling, and weakness of 4th and 5th digits on both hands--it has not improved or worsened since he first noted it. No color changes in fingers, no neck or shoulder pains.     Past Medical History    Diagnosis Date  . Lupus   . CAD (coronary artery disease)      stents RCA/Circ 2001, BMS to LAD 07/2010  . CRF (chronic renal failure)       Baseline Cr 4.5 as of 04/2011 (GFR<20), Dr. Clover Mealy:  he is being prepped for dialysis and referred to [facility] for transplant consideration.  . Cardiomyopathy     Ischemic:  02/2011 EF 30-35%.   Single chamber ICD 11/20/10 ([name])  . Gout   . 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 Left ventricular thrombus 2012    Re-eval 02/2011 showed thrombus RESOLVED, so coumadin was d/c'd (was on it for 66mo)  . implantable cardiac defibrillator single chamber     Medtronic  . Hypertension   . Anemia   . Heart attack 2001 and 2012    3 stents and defib placed in July  . GERD (gastroesophageal reflux disease)   . Hiatal hernia     Past Surgical History  Procedure Date  . Stents     05-2010 and 2- in 2010  . Partial hip arthroplasty     left  . Renal biopsy   . Tympanostomy tube placement 46 yrs old  . Joint replacement   . Av fistula placement 03/28/2011    Procedure: ARTERIOVENOUS (AV) FISTULA CREATION;  Surgeon: Rosetta Posner, MD;  Location: [place];  Service: Vascular;  Laterality: Left;  Creation of Left radiocephallic cimino fistula  . Cardiac catheterization     08/2010  . US echocardiography 12/2010  . Cardiovascular stress test 07/2010  . Av fistula placement 04/27/2011    Procedure: ARTERIOVENOUS (AV) FISTULA CREATION;  Surgeon: Rosetta Posner, MD;  Location: [place];  Service: Vascular;  Laterality: Left;  left basilic vein transposition  . Insert / replace / remove pacemaker     medtronic        dr Juleen China    [place]   icd only  . Total hip arthroplasty 07/09/2011    Procedure: TOTAL HIP ARTHROPLASTY;  Surgeon: Kerin Salen, MD;  Location: [place];  Service: Orthopedics;  Laterality: Right;    Outpatient Prescriptions Prior to Visit  Medication Sig Dispense Refill  . allopurinol (ZYLOPRIM) 300 MG  tablet Take 1 tablet (300 mg total) by mouth daily.  30 tablet  3  . amiodarone (PACERONE) 200 MG tablet Take 1 tablet (200 mg total) by mouth 3 (three) times daily.  90 tablet  2  . aspirin 81 MG tablet Take 1 tablet (81 mg total) by mouth daily.  30 tablet  2  . atorvastatin (LIPITOR) 80 MG tablet Take 1 tablet (80 mg total) by mouth daily.  30 tablet  2  . calcitRIOL (ROCALTROL) 0.25 MCG capsule Take 1 capsule (0.25 mcg total) by mouth daily.  30 capsule  2  . calcium acetate (PHOSLO) 667 MG capsule Take 1 capsule (667 mg total) by mouth 3 (three) times daily with meals.  90 capsule  2  . clopidogrel (PLAVIX) 75 MG tablet Take 1 tablet (75 mg total) by mouth daily.  30 tablet  2  . esomeprazole (NEXIUM) 40 MG capsule Take 1 capsule (40 mg total) by mouth daily before breakfast.  30 capsule  2  . fluticasone (FLOVENT HFA) 44 MCG/ACT inhaler Inhale 1 puff into the lungs 2 (two) times daily.  1 Inhaler  2  . furosemide (LASIX) 40 MG tablet Take 1 tablet (40 mg total) by mouth daily as needed. For fluid.  30 tablet  2  . gabapentin (NEURONTIN) 100 MG capsule Take 1 capsule (100 mg total) by mouth at bedtime.  30 capsule  2  . isosorbide mononitrate (IMDUR) 30 MG 24 hr tablet Take 1 tablet (30 mg total) by mouth daily.  30 tablet  2  . metoprolol (LOPRESSOR) 100 MG tablet Take 1 tablet (100 mg total) by mouth 2 (two) times daily.  60 tablet  2  . niacin (NIASPAN) 500 MG CR tablet Take 2 tablets (1,000 mg total) by mouth at bedtime.  30 tablet  2  . NIFEdipine (PROCARDIA) 10 MG capsule Take 1 capsule (10 mg total) by mouth 3 (three) times daily.  90 capsule  2  . nitroGLYCERIN (NITROSTAT) 0.4 MG SL tablet Place 1 tablet (0.4 mg total) under the tongue every 5 (five) minutes as needed. For chest pains  14 tablet  0  . diphenhydrAMINE (BENADRYL) 25 MG tablet Take 1 tablet (25 mg total) by mouth every 6 (six) hours as needed for itching.  30 tablet  0  . oxyCODONE (OXY IR/ROXICODONE) 5 MG immediate  release tablet Take 1-2 tablets (5-10 mg total) by mouth every 3 (three) hours as needed.  30 tablet  0  . traMADol (ULTRAM) 50 MG tablet Take 1 tablet (50 mg total) by mouth every 6 (six)  hours as needed for pain. For pain  30 tablet  0  . zolpidem (AMBIEN) 5 MG tablet Take 1 tablet (5 mg total) by mouth at bedtime as needed and may repeat dose one time if needed for sleep.  30 tablet  0  . calcium carbonate, dosed in mg elemental calcium, 1250 MG/5ML Take 5 mLs (500 mg of elemental calcium total) by mouth every 6 (six) hours as needed.  450 mL  2  . dexlansoprazole (DEXILANT) 60 MG capsule Take 1 capsule (60 mg total) by mouth daily.  30 capsule  2  NOTE: he has not made the switch from nexium to dexilant yet.  No Known Allergies  ROS AS per HPI, plus having hard, infrequent stool despite using lots of stool softeners (new since surgery).  PE: Blood pressure 100/67, pulse 71, temperature 97.4 F (36.3 C), temperature source Temporal, height 5\' 7"  (1.702 m), weight 200 lb (90.719 kg), SpO2 97.00%. Gen: Alert, well appearing, mild pallor noted.  Patient is oriented to person, place, time, and situation. Affect is pleasant, thought and speech are lucid. CV: distant S1 and S2, regular. LUNGS: CTA bilat, nonlabored resps. Right hip: wound clean, dry, edges well approximated and without tenderness, erythema, or warmth.  No fluctuance or drainage. Hands: diminished sensation to touch in entire 5th digit and ulnar side of palm up to the wrist level bilaterally. Also, both 4th digits with similar decreased sensation on all but the radial side of them.  Strength of the 4th and 5th digits 4/5 in both hands, other fingers 5+/5.  Ulnar pulses nonpalpable but radial pulses 2+ bilat.  Color of all fingers is pink, with all fingers with normal warmth and brisk cap RF.  No skin changes.   LABS:  None here today  RECENT in hospital:     Chemistry      Component Value Date/Time   NA 140 07/20/2011 0600     K 4.0 07/20/2011 0600   CL 103 07/20/2011 0600   CO2 30 07/20/2011 0600   BUN 22 07/20/2011 0600   CREATININE 3.39* 07/20/2011 0600      Component Value Date/Time   CALCIUM 8.5 07/20/2011 0600   CALCIUM 9.1 04/23/2011 0820   ALKPHOS 92 06/27/2011 0949   AST 18 06/27/2011 0949   ALT 11 06/27/2011 0949   BILITOT 0.7 06/27/2011 0949     Lab Results  Component Value Date   WBC 11.4* 07/20/2011   HGB 11.2* 07/20/2011   HCT 32.9* 07/20/2011   MCV 92.4 07/20/2011   PLT 108* 07/20/2011   Lab Results  Component Value Date   CALCIUM 8.5 07/20/2011   PHOS 2.2* 07/20/2011    IMPRESSION AND PLAN:  Ulnar neuropathy of both upper extremities At the level of the wrist bilaterally. Nothing to offer at this time other than neuro referral for further e/m, but he chose to decline this for now, wait longer to see if it gradually resolves. I do not know why he has this.  Status post right hip replacement He's about 3 wks out from surgery and looks good, wound is fine. He'll finish the keflex rx'd by his orthopedist. I wrote a letter allowing him to return to work 08/06/11 per his request today--I think he is fine to do this.  End stage renal disease Doing okay on this so far.  Dry wt has not been established yet. Left arm fistula looks/works well. He'll continue dialysis T/Th/Sat---gets routine lab monitoring there. He is at high  risk for pneumococcal dz so will give pneumovax today. Also, in hospital his anti Hep B surface antibody was negative, so he needs Hep B vaccine and we gave #1 today. He'll return after 30d for office f/u and Hep B #2.  ischemic cardiomyopathy status post anterolateral MI Stable.  Has ICD now. Continue current meds: ASA, plavix, amiodarone, metoprolol, imdur. His fluid balance is certainly tenuous/in flux with recently instituting regular dialysis.  His intermittent frothy, bloody productive cough on some nights has been worked up with a CXR per pt report, was normal.   Suspect some pulm  edema + anticoag effects producing this symptom.  Monitor, go to ED for persistent SOB/frothy productive cough.     FOLLOW UP: Return in about 1 month (around 08/30/2011) for f/u HTN, ESRD, cardiomyopathy.

## 2011-07-30 NOTE — Assessment & Plan Note (Addendum)
At the level of the wrist bilaterally. Nothing to offer at this time other than neuro referral for further e/m, but he chose to decline this for now, wait longer to see if it gradually resolves. I do not know why he has this.

## 2011-07-30 NOTE — Assessment & Plan Note (Addendum)
Stable.  Has ICD now. Continue current meds: ASA, plavix, amiodarone, metoprolol, imdur. His fluid balance is certainly tenuous/in flux with recently instituting regular dialysis.  His intermittent frothy, bloody productive cough on some nights has been worked up with a CXR per pt report, was normal.   Suspect some pulm edema + anticoag effects producing this symptom.  Monitor, go to ED for persistent SOB/frothy productive cough.

## 2011-07-30 NOTE — Assessment & Plan Note (Signed)
Doing okay on this so far.  Dry wt has not been established yet. Left arm fistula looks/works well. He'll continue dialysis T/Th/Sat---gets routine lab monitoring there. He is at high risk for pneumococcal dz so will give pneumovax today. Also, in hospital his anti Hep B surface antibody was negative, so he needs Hep B vaccine and we gave #1 today. He'll return after 30d for office f/u and Hep B #2.

## 2011-07-30 NOTE — Patient Instructions (Signed)
Take amitiza 24 mcg twice daily for constipation

## 2011-07-30 NOTE — Assessment & Plan Note (Signed)
He's about 3 wks out from surgery and looks good, wound is fine. He'll finish the keflex rx'd by his orthopedist. I wrote a letter allowing him to return to work 08/06/11 per his request today--I think he is fine to do this.

## 2011-08-01 ENCOUNTER — Encounter: Payer: Self-pay | Admitting: Internal Medicine

## 2011-08-03 ENCOUNTER — Telehealth: Payer: Self-pay | Admitting: Family Medicine

## 2011-08-03 NOTE — Telephone Encounter (Signed)
I spoke with Ashely who needs to know what dose and what vaccine patient received. Pt received: Recombivax, Adult dose (85mL) which contains 10 mcg/1 mL of Hepatitis B surface antigen.

## 2011-08-06 ENCOUNTER — Ambulatory Visit: Payer: Managed Care, Other (non HMO) | Admitting: Internal Medicine

## 2011-08-27 DIAGNOSIS — Z0279 Encounter for issue of other medical certificate: Secondary | ICD-10-CM

## 2011-08-31 ENCOUNTER — Ambulatory Visit: Payer: Managed Care, Other (non HMO) | Admitting: Family Medicine

## 2011-09-06 ENCOUNTER — Ambulatory Visit (INDEPENDENT_AMBULATORY_CARE_PROVIDER_SITE_OTHER): Payer: Managed Care, Other (non HMO) | Admitting: *Deleted

## 2011-09-06 ENCOUNTER — Encounter: Payer: Self-pay | Admitting: Internal Medicine

## 2011-09-06 DIAGNOSIS — I428 Other cardiomyopathies: Secondary | ICD-10-CM

## 2011-09-06 DIAGNOSIS — I509 Heart failure, unspecified: Secondary | ICD-10-CM

## 2011-09-10 LAB — REMOTE ICD DEVICE
CHARGE TIME: 8.648 s
DEV-0020ICD: NEGATIVE
PACEART VT: 0
RV LEAD AMPLITUDE: 11.4 mv
TOT-0001: 0
TOT-0002: 0
TOT-0006: 20120709000000
TZAT-0002SLOWVT: NEGATIVE
TZAT-0018SLOWVT: NEGATIVE
TZAT-0019FASTVT: 8 V
TZAT-0019SLOWVT: 8 V
TZAT-0020FASTVT: 1.5 ms
TZAT-0020SLOWVT: 1.5 ms
TZON-0003SLOWVT: 360 ms
TZON-0005SLOWVT: 12
TZST-0001FASTVT: 2
TZST-0001FASTVT: 3
TZST-0001FASTVT: 5
TZST-0001SLOWVT: 3
TZST-0001SLOWVT: 5
TZST-0001SLOWVT: 6
TZST-0002FASTVT: NEGATIVE
TZST-0002FASTVT: NEGATIVE
TZST-0002FASTVT: NEGATIVE
TZST-0002SLOWVT: NEGATIVE
TZST-0002SLOWVT: NEGATIVE
VENTRICULAR PACING ICD: 0.04 pct

## 2011-09-11 NOTE — Progress Notes (Signed)
Remote icd check w/icm  

## 2011-09-12 ENCOUNTER — Encounter: Payer: Self-pay | Admitting: Cardiovascular Disease

## 2011-09-12 ENCOUNTER — Ambulatory Visit (INDEPENDENT_AMBULATORY_CARE_PROVIDER_SITE_OTHER): Payer: Managed Care, Other (non HMO) | Admitting: Cardiovascular Disease

## 2011-09-12 VITALS — BP 120/82 | HR 74 | Ht 67.0 in | Wt 190.0 lb

## 2011-09-12 DIAGNOSIS — I2589 Other forms of chronic ischemic heart disease: Secondary | ICD-10-CM

## 2011-09-12 DIAGNOSIS — N529 Male erectile dysfunction, unspecified: Secondary | ICD-10-CM

## 2011-09-12 DIAGNOSIS — I255 Ischemic cardiomyopathy: Secondary | ICD-10-CM

## 2011-09-12 DIAGNOSIS — I2510 Atherosclerotic heart disease of native coronary artery without angina pectoris: Secondary | ICD-10-CM

## 2011-09-12 DIAGNOSIS — Z95810 Presence of automatic (implantable) cardiac defibrillator: Secondary | ICD-10-CM

## 2011-09-12 DIAGNOSIS — N186 End stage renal disease: Secondary | ICD-10-CM

## 2011-09-12 DIAGNOSIS — I25118 Atherosclerotic heart disease of native coronary artery with other forms of angina pectoris: Secondary | ICD-10-CM | POA: Insufficient documentation

## 2011-09-12 DIAGNOSIS — I10 Essential (primary) hypertension: Secondary | ICD-10-CM

## 2011-09-12 DIAGNOSIS — N189 Chronic kidney disease, unspecified: Secondary | ICD-10-CM

## 2011-09-12 DIAGNOSIS — E782 Mixed hyperlipidemia: Secondary | ICD-10-CM

## 2011-09-12 NOTE — Assessment & Plan Note (Signed)
BP low now on dialysis

## 2011-09-12 NOTE — Progress Notes (Signed)
Patient ID: Roy Dyer, male   DOB: 11-22-1965, 46 y.o.   MRN: [id number] Mr. Schlaff is a 46 year old male with a history of coronary artery disease who suffered anterior MI in March of 2012. He underwent bare metal stenting to his LAD at that time. Had previous stents to circ and RCA in 2001 At discharge, he was placed on a LifeVest because of depressed LV function. The patient was maintained on optimal medical therapy. His ejection fraction was reassessed 90 days post revascularization and was found to still be less than 35%. Had BiV AICD placed by Dr [name] 11/20/10. DFT;s not tested due to history of large mural apical thrombus. Repeat MRI done 7/6 showed resolution of thrombus and EF 28% Coumadin stopped  Had hip replaced in March and had worse renal failure and is now on dialysis.  Recent remote AICD check last week was fine BP borderline at dialysis and beta blocker decreased  ROS: Denies fever, malais, weight loss, blurry vision, decreased visual acuity, cough, sputum, SOB, hemoptysis, pleuritic pain, palpitaitons, heartburn, abdominal pain, melena, lower extremity edema, claudication, or rash.  All other systems reviewed and negative  General: Affect appropriate Much thinner and gaunt since D/C HEENT: normal Neck supple with no adenopathy JVP normal no bruits no thyromegaly Lungs clear with no wheezing and good diaphragmatic motion Heart:  S1/S2 no murmur, no rub, gallop or click PMI normal Abdomen: benighn, BS positve, no tenderness, no AAA no bruit.  No HSM or HJR Distal pulses intact with no bruits No edema Neuro non-focal Skin warm and dry No muscular weakness Bad brusing in LUE fistula from infiltration   Current Outpatient Prescriptions  Medication Sig Dispense Refill  . allopurinol (ZYLOPRIM) 300 MG tablet Take 1 tablet (300 mg total) by mouth daily.  30 tablet  3  . amiodarone (PACERONE) 200 MG tablet Take 1 tablet (200 mg total) by mouth 3 (three) times daily.  90 tablet   2  . aspirin 81 MG tablet Take 1 tablet (81 mg total) by mouth daily.  30 tablet  2  . atorvastatin (LIPITOR) 80 MG tablet Take 1 tablet (80 mg total) by mouth daily.  30 tablet  2  . calcitRIOL (ROCALTROL) 0.25 MCG capsule Take 1 capsule (0.25 mcg total) by mouth daily.  30 capsule  2  . calcium acetate (PHOSLO) 667 MG capsule Take 1 capsule (667 mg total) by mouth 3 (three) times daily with meals.  90 capsule  2  . clopidogrel (PLAVIX) 75 MG tablet Take 1 tablet (75 mg total) by mouth daily.  30 tablet  2  . esomeprazole (NEXIUM) 40 MG capsule Take 1 capsule (40 mg total) by mouth daily before breakfast.  30 capsule  2  . fluticasone (FLOVENT HFA) 44 MCG/ACT inhaler Inhale 1 puff into the lungs 2 (two) times daily.  1 Inhaler  2  . furosemide (LASIX) 40 MG tablet Take 1 tablet (40 mg total) by mouth daily as needed. For fluid.  30 tablet  2  . isosorbide mononitrate (IMDUR) 30 MG 24 hr tablet Take 1 tablet (30 mg total) by mouth daily.  30 tablet  2  . metoprolol (LOPRESSOR) 50 MG tablet Take 50 mg by mouth 2 (two) times daily.      . niacin (NIASPAN) 500 MG CR tablet Take 2 tablets (1,000 mg total) by mouth at bedtime.  30 tablet  2  . nitroGLYCERIN (NITROSTAT) 0.4 MG SL tablet Place 1 tablet (0.4 mg total) under the  tongue every 5 (five) minutes as needed. For chest pains  14 tablet  0  . promethazine (PHENERGAN) 12.5 MG tablet Take 12.5 mg by mouth every 6 (six) hours as needed.      . traMADol (ULTRAM) 50 MG tablet Take 1 tablet (50 mg total) by mouth every 6 (six) hours as needed for pain. For pain  30 tablet  0  . DISCONTD: calcium carbonate, dosed in mg elemental calcium, 1250 MG/5ML Take 5 mLs (500 mg of elemental calcium total) by mouth every 6 (six) hours as needed.  450 mL  2  . DISCONTD: dexlansoprazole (DEXILANT) 60 MG capsule Take 1 capsule (60 mg total) by mouth daily.  30 capsule  2    Allergies  Review of patient's allergies indicates no known  allergies.  Electrocardiogram:  Assessment and Plan

## 2011-09-12 NOTE — Assessment & Plan Note (Signed)
He will monitor BP and HR during dialysis  D/C amiodarone and nitrates  If systolic BP above [id number] consider ARB  May be able to give him ARB on nondialysis days for mortality benefit with CHF

## 2011-09-12 NOTE — Assessment & Plan Note (Signed)
Cholesterol is at goal.  Continue current dose of statin and diet Rx.  No myalgias or side effects.  F/U  LFT's in 6 months. Lab Results  Component Value Date   LDLCALC 38 06/27/2011

## 2011-09-12 NOTE — Patient Instructions (Signed)
Your physician recommends that you schedule a follow-up appointment in:  [place] physician has recommended you make the following change in your medication: STOP AMIODARONE AND  ISOSORBIDE (IMDUR)

## 2011-09-12 NOTE — Assessment & Plan Note (Signed)
NO D/C normal function by remote check last week

## 2011-09-12 NOTE — Assessment & Plan Note (Signed)
Anterior MI EF 35%  BMS LAD 2012 and distant stents to RCA and circumflex.  Continue low dose beta blocker and Plavix

## 2011-09-12 NOTE — Assessment & Plan Note (Signed)
Beta blocker decreased.  Will take imdur an hour before attempted sex.  Do not favor Cialis or Viagra due to CAD and stents

## 2011-09-12 NOTE — Assessment & Plan Note (Signed)
Volume much improved since being on dialysis.  Would like to start ACE or ARB since he is on dialysis  BP borderline Stop nitrates  And amiodarone and reassess in 8 weeks

## 2011-09-14 ENCOUNTER — Ambulatory Visit (INDEPENDENT_AMBULATORY_CARE_PROVIDER_SITE_OTHER): Payer: Managed Care, Other (non HMO) | Admitting: Family Medicine

## 2011-09-14 ENCOUNTER — Encounter: Payer: Self-pay | Admitting: Gastroenterology

## 2011-09-14 ENCOUNTER — Telehealth: Payer: Self-pay

## 2011-09-14 ENCOUNTER — Encounter: Payer: Self-pay | Admitting: Family Medicine

## 2011-09-14 ENCOUNTER — Ambulatory Visit (HOSPITAL_BASED_OUTPATIENT_CLINIC_OR_DEPARTMENT_OTHER)
Admission: RE | Admit: 2011-09-14 | Discharge: 2011-09-14 | Disposition: A | Discharge status: Home / Self Care | Payer: Managed Care, Other (non HMO) | Source: Ambulatory Visit | Attending: Family Medicine | Admitting: Family Medicine

## 2011-09-14 VITALS — BP 132/77 | HR 74 | Temp 98.0°F | Ht 67.0 in | Wt 188.1 lb

## 2011-09-14 DIAGNOSIS — I10 Essential (primary) hypertension: Secondary | ICD-10-CM

## 2011-09-14 DIAGNOSIS — N186 End stage renal disease: Secondary | ICD-10-CM

## 2011-09-14 DIAGNOSIS — R0989 Other specified symptoms and signs involving the circulatory and respiratory systems: Secondary | ICD-10-CM

## 2011-09-14 DIAGNOSIS — R918 Other nonspecific abnormal finding of lung field: Secondary | ICD-10-CM | POA: Insufficient documentation

## 2011-09-14 DIAGNOSIS — R791 Abnormal coagulation profile: Secondary | ICD-10-CM | POA: Insufficient documentation

## 2011-09-14 DIAGNOSIS — N189 Chronic kidney disease, unspecified: Secondary | ICD-10-CM

## 2011-09-14 DIAGNOSIS — R0609 Other forms of dyspnea: Secondary | ICD-10-CM

## 2011-09-14 DIAGNOSIS — Z950 Presence of cardiac pacemaker: Secondary | ICD-10-CM | POA: Insufficient documentation

## 2011-09-14 NOTE — Assessment & Plan Note (Deleted)
Lab Results  Component Value Date   INR 2.39* 07/20/2011   INR 2.70* 07/19/2011   INR 2.63* 07/18/2011   His d/c date from hospitalization for hip surgery was 07/20/11, and I think he was on post-op coumadin briefly. I would like to check this today and make sure INR is documented as being back to normal.

## 2011-09-14 NOTE — Assessment & Plan Note (Signed)
With hx of ischemic cardiomyopathy, having some low BP issues with dialysis which has prompted recent D/C of his imdur and amiodarone and decrease in toprol dose.  Cardiology would like to add ARB or ACE for mortality benefit IF bp over the next month or two remains over [id number] systolic and he has no more ongoing hypotension with dialysis.

## 2011-09-14 NOTE — Assessment & Plan Note (Signed)
Question effusion vs atelectasis. Will check CXR.  If atelectasis he may benefit from incentive spirometry. If effusion, will likely not make any changes in therapy since his fluid balance otherwise looks great and he is asymptomatic from a pulm standpoint.

## 2011-09-14 NOTE — Progress Notes (Signed)
OFFICE VISIT  09/14/2011   CC:  Chief Complaint  Patient presents with  . Follow-up    1 month f/up- HTN, ESRD, Cardiomyopathy     HPI:    Patient is a 46 y.o. Caucasian male who presents for 54mo f/u HTN, ESRD on hemodialysis, ischemic cardiomyopathy, recent hx of right total hip replacement. Hip feeling fine, wound is healed.  Feeling ok since getting back to work full time, energy level gradually building up.  HD: having bp drops into 90s and feeling symptomatic, so HD is stopped/started frequently.  As a result, cardiology has recently stopped his amio and imdur and decreased his beta blocker dose.   He feels like his fluid balance is good.  He takes lasix only on non-HD days. I have no recent labs from HD.  He goes to [facility] for renal transplant initial eval 10/10/11 and would like to get rescheduled for the prerequisite screening colonoscopy prior to that appt (it had to be cancelled earlier this year due to hip surgery), also needs copy of TB skin test we did and his last PSA value.  ROS: no CP, no persistent cough, no SOB, no wheezing, no hemoptysis, no fevers.   Past Medical History  Diagnosis Date  . Lupus   . CAD (coronary artery disease)      stents RCA/Circ 2001, BMS to LAD 07/2010  . CRF (chronic renal failure)       Baseline Cr 4.5 as of 04/2011 (GFR<20), Dr. Clover Mealy:  he is being prepped for dialysis and referred to [facility] for transplant consideration.  . Cardiomyopathy     Ischemic:  02/2011 EF 30-35%.   Single chamber ICD 11/20/10 ([name])  . Gout   . 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 Left ventricular thrombus 2012    Re-eval 02/2011 showed thrombus RESOLVED, so coumadin was d/c'd (was on it for 77mo)  . implantable cardiac defibrillator single chamber     Medtronic  . Hypertension   . Anemia   . Heart attack 2001 and 2012    3 stents and defib placed in July  . GERD (gastroesophageal reflux disease)   .  Hiatal hernia     Past Surgical History  Procedure Date  . Stents     05-2010 and 2- in 2010  . Partial hip arthroplasty     left  . Renal biopsy   . Tympanostomy tube placement 46 yrs old  . Joint replacement   . Av fistula placement 03/28/2011    Procedure: ARTERIOVENOUS (AV) FISTULA CREATION;  Surgeon: Rosetta Posner, MD;  Location: [place];  Service: Vascular;  Laterality: Left;  Creation of Left radiocephallic cimino fistula  . Cardiac catheterization     08/2010  . US echocardiography 12/2010  . Cardiovascular stress test 07/2010  . Av fistula placement 04/27/2011    Procedure: ARTERIOVENOUS (AV) FISTULA CREATION;  Surgeon: Rosetta Posner, MD;  Location: [place];  Service: Vascular;  Laterality: Left;  left basilic vein transposition  . Insert / replace / remove pacemaker     medtronic        dr Juleen China    [place]   icd only  . Total hip arthroplasty 07/09/2011    Procedure: TOTAL HIP ARTHROPLASTY;  Surgeon: Kerin Salen, MD;  Location: [place];  Service: Orthopedics;  Laterality: Right;    Outpatient Prescriptions Prior to Visit  Medication Sig Dispense Refill  .  allopurinol (ZYLOPRIM) 300 MG tablet Take 1 tablet (300 mg total) by mouth daily.  30 tablet  3  . aspirin 81 MG tablet Take 1 tablet (81 mg total) by mouth daily.  30 tablet  2  . atorvastatin (LIPITOR) 80 MG tablet Take 1 tablet (80 mg total) by mouth daily.  30 tablet  2  . calcitRIOL (ROCALTROL) 0.25 MCG capsule Take 1 capsule (0.25 mcg total) by mouth daily.  30 capsule  2  . calcium acetate (PHOSLO) 667 MG capsule Take 1 capsule (667 mg total) by mouth 3 (three) times daily with meals.  90 capsule  2  . clopidogrel (PLAVIX) 75 MG tablet Take 1 tablet (75 mg total) by mouth daily.  30 tablet  2  . esomeprazole (NEXIUM) 40 MG capsule Take 1 capsule (40 mg total) by mouth daily before breakfast.  30 capsule  2  . fluticasone (FLOVENT HFA) 44 MCG/ACT inhaler Inhale 1 puff into the lungs 2 (two) times daily.  1 Inhaler  2  .  furosemide (LASIX) 40 MG tablet Take 1 tablet (40 mg total) by mouth daily as needed. For fluid.  30 tablet  2  . metoprolol (LOPRESSOR) 50 MG tablet Take 50 mg by mouth 2 (two) times daily.      . niacin (NIASPAN) 500 MG CR tablet Take 2 tablets (1,000 mg total) by mouth at bedtime.  30 tablet  2  . promethazine (PHENERGAN) 12.5 MG tablet Take 12.5 mg by mouth every 6 (six) hours as needed.      . traMADol (ULTRAM) 50 MG tablet Take 1 tablet (50 mg total) by mouth every 6 (six) hours as needed for pain. For pain  30 tablet  0  . nitroGLYCERIN (NITROSTAT) 0.4 MG SL tablet Place 1 tablet (0.4 mg total) under the tongue every 5 (five) minutes as needed. For chest pains  14 tablet  0    No Known Allergies  ROS As per HPI  PE: Blood pressure 132/77, pulse 74, temperature 98 F (36.7 C), temperature source Temporal, height 5\' 7"  (1.702 m), weight 188 lb 1.9 oz (85.331 kg), SpO2 98.00%. Gen: Alert, well appearing.  Patient is oriented to person, place, time, and situation. CV: RRR LUNGS: diminished breath sounds in left anterior lung field, with +egophony on left anterior. Lungs otherwise clear and equal, nonlabored resps. EXT: no clubbing, cyanosis, or edema.    LABS:  None today  IMPRESSION AND PLAN:  End stage renal disease Stable, left biceps hematoma from recent infiltration is slowly resolving. Will request most recent blood work from his dialysis center. Gave pt copy of his TB skin test result and latest PSA (June 2012) that he needs for his upcoming renal transplant eval at [facility] on 10/10/11. We'll also try to get him set back up for his screening colonoscopy prior to that eval if possible.  ESSENTIAL HYPERTENSION, BENIGN With hx of ischemic cardiomyopathy, having some low BP issues with dialysis which has prompted recent D/C of his imdur and amiodarone and decrease in toprol dose.  Cardiology would like to add ARB or ACE for mortality benefit IF bp over the next month or two  remains over 123XX123 systolic and he has no more ongoing hypotension with dialysis.    Lung field abnormal finding on examination Question effusion vs atelectasis. Will check CXR.  If atelectasis he may benefit from incentive spirometry. If effusion, will likely not make any changes in therapy since his fluid balance otherwise looks great and  he is asymptomatic from a pulm standpoint.    Elevated INR Lab Results  Component Value Date   INR 2.39* 07/20/2011   INR 2.70* 07/19/2011   INR 2.63* 07/18/2011   His d/c date from hospitalization for hip surgery was 07/20/11, and I think he was on post-op coumadin briefly. I would like to check this today and make sure INR is documented as being back to normal.      FOLLOW UP: Return for already has appt set in June with me.

## 2011-09-14 NOTE — Telephone Encounter (Signed)
Message copied by Varney Daily on Fri Sep 14, 2011  9:06 AM ------      Message from: Ricardo Jericho H      Created: Fri Sep 14, 2011  9:01 AM      Regarding: labs       Pls contact pt's dialysis center: [facility] [address] in [place] and request his last set of blood work that was done there.  thx

## 2011-09-14 NOTE — Assessment & Plan Note (Signed)
Lab Results  Component Value Date   INR 2.39* 07/20/2011   INR 2.70* 07/19/2011   INR 2.63* 07/18/2011   His d/c date from hospitalization for hip surgery was 07/20/11, and I think he was on post-op coumadin briefly. I would like to check this today and make sure INR is documented as being back to normal.

## 2011-09-14 NOTE — Telephone Encounter (Signed)
Tammy w/ [facility] states she will fax over patients last set of labs

## 2011-09-14 NOTE — Assessment & Plan Note (Addendum)
Stable, left biceps hematoma from recent infiltration is slowly resolving. Will request most recent blood work from his dialysis center. Gave pt copy of his TB skin test result and latest PSA (June 2012) that he needs for his upcoming renal transplant eval at [facility] on 10/10/11. We'll also try to get him set back up for his screening colonoscopy prior to that eval if possible.

## 2011-09-28 ENCOUNTER — Encounter: Payer: Self-pay | Admitting: *Deleted

## 2011-10-04 ENCOUNTER — Ambulatory Visit: Payer: Managed Care, Other (non HMO) | Admitting: Gastroenterology

## 2011-10-10 ENCOUNTER — Encounter: Payer: Self-pay | Admitting: Internal Medicine

## 2011-10-11 ENCOUNTER — Other Ambulatory Visit: Payer: Self-pay | Admitting: Cardiovascular Disease

## 2011-10-11 ENCOUNTER — Telehealth: Payer: Self-pay | Admitting: Family Medicine

## 2011-10-11 ENCOUNTER — Encounter: Payer: Self-pay | Admitting: Internal Medicine

## 2011-10-11 ENCOUNTER — Ambulatory Visit (INDEPENDENT_AMBULATORY_CARE_PROVIDER_SITE_OTHER): Payer: Managed Care, Other (non HMO) | Admitting: *Deleted

## 2011-10-11 DIAGNOSIS — I2510 Atherosclerotic heart disease of native coronary artery without angina pectoris: Secondary | ICD-10-CM

## 2011-10-11 DIAGNOSIS — Z95810 Presence of automatic (implantable) cardiac defibrillator: Secondary | ICD-10-CM

## 2011-10-11 DIAGNOSIS — I2589 Other forms of chronic ischemic heart disease: Secondary | ICD-10-CM

## 2011-10-11 NOTE — Telephone Encounter (Signed)
Patient's insurance company needs the date that the doctor determined patient could no longer work. There is a medical release form scanned under the media tab.

## 2011-10-12 ENCOUNTER — Other Ambulatory Visit: Payer: Self-pay | Admitting: *Deleted

## 2011-10-12 ENCOUNTER — Ambulatory Visit (INDEPENDENT_AMBULATORY_CARE_PROVIDER_SITE_OTHER): Payer: Managed Care, Other (non HMO) | Admitting: Internal Medicine

## 2011-10-12 ENCOUNTER — Encounter: Payer: Self-pay | Admitting: Internal Medicine

## 2011-10-12 VITALS — BP 122/64 | HR 91 | Ht 67.0 in | Wt 180.0 lb

## 2011-10-12 DIAGNOSIS — R112 Nausea with vomiting, unspecified: Secondary | ICD-10-CM

## 2011-10-12 MED ORDER — FLUTICASONE PROPIONATE HFA 44 MCG/ACT IN AERO: 1.0000 | INHALATION_SPRAY | Freq: Two times a day (BID) | RESPIRATORY_TRACT | Status: DC

## 2011-10-12 MED ORDER — CALCITRIOL 0.25 MCG PO CAPS: 0.2500 ug | ORAL_CAPSULE | Freq: Every day | ORAL | Status: DC

## 2011-10-12 MED ORDER — CALCIUM ACETATE 667 MG PO CAPS: 667.0000 mg | ORAL_CAPSULE | Freq: Three times a day (TID) | ORAL | Status: DC

## 2011-10-12 MED ORDER — ONDANSETRON 4 MG PO TBDP: 4.0000 mg | ORAL_TABLET | Freq: Three times a day (TID) | ORAL | Status: AC | PRN

## 2011-10-12 NOTE — Patient Instructions (Signed)
We have sent the following medications to your pharmacy for you to pick up at your convenience: Zofran  If your symptoms are not any better within the next few weeks, call our office  [phone]

## 2011-10-12 NOTE — Progress Notes (Signed)
Subjective:    Patient ID: Roy Dyer, male    DOB: 06/29/1965, 46 y.o.   MRN: [id number]  HPI Roy Dyer is a 46 yo male with PMH of SLE, end-stage renal disease on dialysis since March 2013, ischemic cardiomyopathy status post ICD placement who is seen in consultation at the request of Dr. Anitra Lauth for evaluation of intermittent N/V.  The patient states that he intermittently develops dry heaves with occasional vomiting. He has very little preceding nausea. He feels that this is worse on an empty stomach, and only became an issue since March 2013. He relates this very much to his dialysis sessions. It has improved recently, but initially was occurring 4-5 times in a day, on his dialysis days. Now it is occurring once or twice a week, but still associated with dialysis. He is not having abdominal pain associated with these symptoms. He's had no hematemesis. He reports his appetite was initially poor around the start of dialysis, but has been improving. He has had no heartburn as long as he takes Nexium. No dysphagia or odynophagia. No early satiety. He has had some loose stools to starting dialysis occurring approximately 4 times daily, but no rectal bleeding or melena. Also of note he was started on amiodarone in March 2013 for arrhythmia, but this was recently discontinued by his cardiologist. He stopped is less than one month ago. He also wonders if perhaps stopping this resulted in improvement in his GI symptoms. No fevers or chills.  He does have a prior history of dysphagia and hematemesis back in 2010. He underwent an upper endoscopy by Dr. Fuller Plan which revealed esophagitis and otherwise normal examination.  He is currently being evaluated by [facility] for possible kidney transplantation. He was told this point, colonoscopy is not needed as a part of his kidney transplantation evaluation.  Review of Systems  as per history of present illness, otherwise neg  Patient Active  Problem List  Diagnoses  . Mixed hyperlipidemia  . ESSENTIAL HYPERTENSION, BENIGN  . ischemic cardiomyopathy status post anterolateral MI  . LUPUS  . Erectile dysfunction  . implantable cardiac defibrillator -Medtronic-single-chamber  . End stage renal disease  . Raynaud's disease  . Ulnar neuropathy of both upper extremities  . CAD (coronary artery disease)  . Lung field abnormal finding on examination  . Elevated INR   Past Surgical History  Procedure Date  . Stents     05-2010 and 2- in 2010  . Partial hip arthroplasty     left  . Renal biopsy   . Tympanostomy tube placement 46 yrs old  . Joint replacement   . Av fistula placement 03/28/2011    Procedure: ARTERIOVENOUS (AV) FISTULA CREATION;  Surgeon: Rosetta Posner, MD;  Location: [place];  Service: Vascular;  Laterality: Left;  Creation of Left radiocephallic cimino fistula  . Cardiac catheterization     08/2010  . US echocardiography 12/2010  . Cardiovascular stress test 07/2010  . Av fistula placement 04/27/2011    Procedure: ARTERIOVENOUS (AV) FISTULA CREATION;  Surgeon: Rosetta Posner, MD;  Location: [place];  Service: Vascular;  Laterality: Left;  left basilic vein transposition  . Insert / replace / remove pacemaker     medtronic        dr Juleen China    [place]   icd only  . Total hip arthroplasty 07/09/2011    Procedure: TOTAL HIP ARTHROPLASTY;  Surgeon: Kerin Salen, MD;  Location: [place];  Service: Orthopedics;  Laterality: Right;   Current Outpatient Prescriptions  Medication Sig Dispense Refill  . allopurinol (ZYLOPRIM) 300 MG tablet Take 1 tablet (300 mg total) by mouth daily.  30 tablet  3  . aspirin 81 MG tablet Take 1 tablet (81 mg total) by mouth daily.  30 tablet  2  . atorvastatin (LIPITOR) 80 MG tablet Take 1 tablet (80 mg total) by mouth daily.  30 tablet  2  . calcitRIOL (ROCALTROL) 0.25 MCG capsule Take 1 capsule (0.25 mcg total) by mouth daily.  30 capsule  2  . calcium acetate (PHOSLO) 667 MG capsule Take 1  capsule (667 mg total) by mouth 3 (three) times daily with meals.  90 capsule  2  . clopidogrel (PLAVIX) 75 MG tablet Take 1 tablet (75 mg total) by mouth daily.  30 tablet  2  . esomeprazole (NEXIUM) 40 MG capsule Take 1 capsule (40 mg total) by mouth daily before breakfast.  30 capsule  2  . fluticasone (FLOVENT HFA) 44 MCG/ACT inhaler Inhale 1 puff into the lungs 2 (two) times daily.  1 Inhaler  2  . furosemide (LASIX) 40 MG tablet Take 1 tablet (40 mg total) by mouth daily as needed. For fluid.  30 tablet  2  . metoprolol (LOPRESSOR) 50 MG tablet Take 50 mg by mouth 2 (two) times daily.      . niacin (NIASPAN) 500 MG CR tablet Take 2 tablets (1,000 mg total) by mouth at bedtime.  30 tablet  2  . NITROSTAT 0.4 MG SL tablet USE 1 TAB UNDER TONGUE EVERY 5 MINUTES UP TO 3 DOSES AS NEEDED  25 tablet  2  . promethazine (PHENERGAN) 12.5 MG tablet Take 12.5 mg by mouth every 6 (six) hours as needed.      . traMADol (ULTRAM) 50 MG tablet Take 1 tablet (50 mg total) by mouth every 6 (six) hours as needed for pain. For pain  30 tablet  0  . ondansetron (ZOFRAN ODT) 4 MG disintegrating tablet Take 1 tablet (4 mg total) by mouth every 8 (eight) hours as needed for nausea.  60 tablet  0  . DISCONTD: atorvastatin (LIPITOR) 80 MG tablet TAKE 1 TABLET BY MOUTH EVERY DAY AT BEDTIME  30 tablet  10  . DISCONTD: calcium carbonate, dosed in mg elemental calcium, 1250 MG/5ML Take 5 mLs (500 mg of elemental calcium total) by mouth every 6 (six) hours as needed.  450 mL  2  . DISCONTD: dexlansoprazole (DEXILANT) 60 MG capsule Take 1 capsule (60 mg total) by mouth daily.  30 capsule  2   No Known Allergies  Family History  Problem Relation Age of Onset  . Kidney failure Mother   . Lupus Mother   . Stroke Mother   . Diabetes Mother     type 2  . Heart attack Father     X 7  . Hypertension Father   . Hyperlipidemia Father   . Heart attack Sister 59    X 1  . Hyperlipidemia Sister   . Hypertension Sister   .  Heart disease Sister   . Heart attack Paternal Grandfather   . Heart disease Paternal Aunt   . Heart disease Paternal Uncle    History  Substance Use Topics  . Smoking status: Former Smoker -- 0.5 packs/day for 30 years    Types: Cigarettes    Quit date: 08/02/2010  . Smokeless tobacco: Never Used  . Alcohol Use: No  Objective:   Physical Exam BP 122/64  Pulse 91  Ht 5\' 7"  (1.702 m)  Wt 180 lb (81.647 kg)  BMI 28.19 kg/m2 Constitutional: Well-developed and well-nourished. No distress. HEENT: Normocephalic and atraumatic. Oropharynx is clear and moist. No oropharyngeal exudate. Conjunctivae are normal. Pupils are equal round and reactive to light. No scleral icterus. Neck: Neck supple. Trachea midline. Cardiovascular: Normal rate, regular rhythm and intact distal pulses. No M/R/G, ICD in place right upper chest Pulmonary/chest: Effort normal and breath sounds normal. No wheezing, rales or rhonchi. Abdominal: Soft, nontender, nondistended. Bowel sounds active throughout. Abdominal striae bilaterally. There are no masses palpable. No hepatosplenomegaly. Extremities: no clubbing, cyanosis, trace pretibial edema Lymphadenopathy: No cervical adenopathy noted. Neurological: Alert and oriented to person place and time. Skin: Skin is warm and dry. No rashes noted. Psychiatric: Normal mood and affect. Behavior is normal.     Assessment & Plan:  46 yo male with PMH of SLE, end-stage renal disease on dialysis since March 2013, ischemic cardiomyopathy status post ICD placement who is seen in consultation at the request of Dr. Anitra Lauth for evaluation of intermittent N/V.  1. Intermittent vomiting -- at present his symptoms have improved greatly in the past few months. His symptoms also correlate with his dialysis days, suggesting that there is some link here. It is also possible, that the discontinuation of amiodarone has led to some improvement. At present he has no other alarm symptoms,  and I feel that we can treat him symptomatically for now. I've advised him that should his symptoms fail to totally resolve or certainly if they worsen, and we should evaluate further, likely with repeat upper endoscopy. He voices understanding. For now we will try Zofran 4 mg ODT every 8 hours as needed for nausea/vomiting. He is happy with this plan  2. CRC screening -- he is average risk for colorectal cancer screening, and thus I recommend screening colonoscopy at age 41. However, if his nephrologist at [facility] would like this test performed as part of his pretransplant evaluation, I would be happy to schedule this for him. He understands and will let us know should this be advised by his doctors at [facility].

## 2011-10-12 NOTE — Telephone Encounter (Signed)
Faxed refill request received from pharmacy for calcitriol and calcium acetate Last filled by MD on never, last filled by hospital MD Last seen on 09/14/11 Follow up 10/2011 Please advise refills.  Thanks.

## 2011-10-12 NOTE — Telephone Encounter (Signed)
Per pt he went out of work on 07/09/11 the day of his surgery.  He went back to work on 08/06/11.  Melody at [facility] notified.  She states they are verifying dates.

## 2011-10-12 NOTE — Telephone Encounter (Signed)
Pt was seen on 09/14/11.  Pt was working at that time.  Message left at patient home number requesting RC for clarification.  Message also left on mobile voicemail.

## 2011-10-15 LAB — REMOTE ICD DEVICE
DEV-0020ICD: NEGATIVE
FVT: 0
PACEART VT: 0
TOT-0001: 0
TOT-0002: 0
TOT-0006: 20120709000000
TZAT-0001SLOWVT: 1
TZAT-0002FASTVT: NEGATIVE
TZAT-0002SLOWVT: NEGATIVE
TZAT-0012SLOWVT: 200 ms
TZAT-0018FASTVT: NEGATIVE
TZAT-0018SLOWVT: NEGATIVE
TZAT-0019FASTVT: 8 V
TZAT-0020FASTVT: 1.5 ms
TZON-0004SLOWVT: 16
TZST-0001FASTVT: 5
TZST-0001FASTVT: 6
TZST-0001SLOWVT: 4
TZST-0001SLOWVT: 6
TZST-0002FASTVT: NEGATIVE
TZST-0002FASTVT: NEGATIVE
TZST-0002FASTVT: NEGATIVE
TZST-0002SLOWVT: NEGATIVE
TZST-0002SLOWVT: NEGATIVE
VENTRICULAR PACING ICD: 0.01 pct

## 2011-10-19 ENCOUNTER — Encounter: Payer: Self-pay | Admitting: *Deleted

## 2011-10-23 ENCOUNTER — Ambulatory Visit: Payer: Managed Care, Other (non HMO) | Admitting: Family Medicine

## 2011-10-24 ENCOUNTER — Ambulatory Visit (INDEPENDENT_AMBULATORY_CARE_PROVIDER_SITE_OTHER): Payer: Managed Care, Other (non HMO) | Admitting: Family Medicine

## 2011-10-24 ENCOUNTER — Encounter: Payer: Self-pay | Admitting: Family Medicine

## 2011-10-24 VITALS — BP 119/82 | HR 67 | Ht 67.0 in | Wt 178.0 lb

## 2011-10-24 DIAGNOSIS — I2589 Other forms of chronic ischemic heart disease: Secondary | ICD-10-CM

## 2011-10-24 DIAGNOSIS — N186 End stage renal disease: Secondary | ICD-10-CM

## 2011-10-24 DIAGNOSIS — N529 Male erectile dysfunction, unspecified: Secondary | ICD-10-CM

## 2011-10-24 DIAGNOSIS — I255 Ischemic cardiomyopathy: Secondary | ICD-10-CM

## 2011-10-24 NOTE — Progress Notes (Signed)
OFFICE VISIT  10/25/2011   CC:  Chief Complaint  Patient presents with  . Follow-up    doing well, no concerns     HPI:    Patient is a 46 y.o. Caucasian male who presents for 6 wks f/u for ESRD/HTN/Ischemic cardiomyopathy. Saw [facility] transplant service for eval at the end of may, plan is for some lower ext vac eval and cardiac stress test at some point, then get on waiting list: could be 5-7 yrs b/c he is O+, also thinking about living donor--doesn't know yet. He feels like dialysis is going better, nausea with this is nearly resolved--hasn't had to use zofran that his GI MD rx'd. He holds his bp med on morning that he has dialysis and bp's have been staying above 123XX123 systolic and over 70 diastolic during dialysis. Still having E.D.  Has been worse since starting dialysis about 4 mo ago.   Past Medical History  Diagnosis Date  . Lupus   . CAD (coronary artery disease)      stents RCA/Circ 2001, BMS to LAD 07/2010  . CRF (chronic renal failure)       Baseline Cr 4.5 as of 04/2011 (GFR<20), Dr. Clover Mealy:  he is being prepped for dialysis and referred to [facility] for transplant consideration.  . Cardiomyopathy     Ischemic:  02/2011 EF 30-35%.   Single chamber ICD 11/20/10 ([name])  . Gout   . 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 Left ventricular thrombus 2012    Re-eval 02/2011 showed thrombus RESOLVED, so coumadin was d/c'd (was on it for 37mo)  . implantable cardiac defibrillator single chamber     Medtronic  . Hypertension   . Anemia   . Heart attack 2001 and 2012    3 stents and defib placed in July  . GERD (gastroesophageal reflux disease)   . Hiatal hernia     Past Surgical History  Procedure Date  . Stents     05-2010 and 2- in 2010  . Partial hip arthroplasty     left  . Renal biopsy   . Tympanostomy tube placement 46 yrs old  . Joint replacement   . Av fistula placement 03/28/2011    Procedure: ARTERIOVENOUS  (AV) FISTULA CREATION;  Surgeon: Rosetta Posner, MD;  Location: [place];  Service: Vascular;  Laterality: Left;  Creation of Left radiocephallic cimino fistula  . Cardiac catheterization     08/2010  . US echocardiography 12/2010  . Cardiovascular stress test 07/2010  . Av fistula placement 04/27/2011    Procedure: ARTERIOVENOUS (AV) FISTULA CREATION;  Surgeon: Rosetta Posner, MD;  Location: [place];  Service: Vascular;  Laterality: Left;  left basilic vein transposition  . Insert / replace / remove pacemaker     medtronic        dr Juleen China    [place]   icd only  . Total hip arthroplasty 07/09/2011    Procedure: TOTAL HIP ARTHROPLASTY;  Surgeon: Kerin Salen, MD;  Location: [place];  Service: Orthopedics;  Laterality: Right;    Outpatient Prescriptions Prior to Visit  Medication Sig Dispense Refill  . allopurinol (ZYLOPRIM) 300 MG tablet Take 1 tablet (300 mg total) by mouth daily.  30 tablet  3  . aspirin 81 MG tablet Take 1 tablet (81 mg total) by mouth daily.  30 tablet  2  . atorvastatin (LIPITOR) 80 MG tablet Take 1 tablet (  80 mg total) by mouth daily.  30 tablet  2  . calcitRIOL (ROCALTROL) 0.25 MCG capsule Take 1 capsule (0.25 mcg total) by mouth daily.  30 capsule  5  . calcium acetate (PHOSLO) 667 MG capsule Take 1 capsule (667 mg total) by mouth 3 (three) times daily with meals.  90 capsule  2  . clopidogrel (PLAVIX) 75 MG tablet Take 1 tablet (75 mg total) by mouth daily.  30 tablet  2  . esomeprazole (NEXIUM) 40 MG capsule Take 1 capsule (40 mg total) by mouth daily before breakfast.  30 capsule  2  . fluticasone (FLOVENT HFA) 44 MCG/ACT inhaler Inhale 1 puff into the lungs 2 (two) times daily.  1 Inhaler  2  . furosemide (LASIX) 40 MG tablet Take 1 tablet (40 mg total) by mouth daily as needed. For fluid.  30 tablet  2  . metoprolol (LOPRESSOR) 50 MG tablet Take 50 mg by mouth 2 (two) times daily.      . niacin (NIASPAN) 500 MG CR tablet Take 2 tablets (1,000 mg total) by mouth at bedtime.   30 tablet  2  . NITROSTAT 0.4 MG SL tablet USE 1 TAB UNDER TONGUE EVERY 5 MINUTES UP TO 3 DOSES AS NEEDED  25 tablet  2  . promethazine (PHENERGAN) 12.5 MG tablet Take 12.5 mg by mouth every 6 (six) hours as needed.      . traMADol (ULTRAM) 50 MG tablet Take 1 tablet (50 mg total) by mouth every 6 (six) hours as needed for pain. For pain  30 tablet  0    No Known Allergies  ROS As per HPI  PE: Blood pressure 119/82, pulse 67, height 5\' 7"  (1.702 m), weight 178 lb (80.74 kg). Wt is down 10 lb from 6wks ago when I last saw him. Gen: Alert, well appearing.  Patient is oriented to person, place, time, and situation. Neck: supple/nontender.  No LAD, mass, or TM.  Carotid pulses 2+ bilaterally, without bruits. CV: RRR, systolic murmur, no rub or gallop  LUNGS: CTA bilat, nonlabored resps, good aeration in all lung fields. EXT: no clubbing, cyanosis, or edema.   LABS:  None today.  IMPRESSION AND PLAN:  ischemic cardiomyopathy status post anterolateral MI He has ICD. Dr. Johnsie Cancel mentioned at his last cardiology f/u that if pt's bp's stayed above 123XX123 systolic during dialysis then he may begin low dose ACE-I or ARB.  Will attempt to get in touch with Dr. Johnsie Cancel to see if he wants me to start this med since his bps have been good.  End stage renal disease Hemodialysis: Doing well with this now that he is about 4 months into it. He is continuing on T/Th/Sat schedule of dialysis and he gets chemistries done regularly there.  Erectile dysfunction Worsened significantly since getting on HD 4 mo ago.  Testosterone measurement 1 yr ago was normal. With his low bp issues of late, I'm certainly hesitant to use a phosphodiesterase inhibitor. We'll refer him to urology for further evaluation and management of this problem.    FOLLOW UP: Return in about 6 months (around 04/24/2012) for routine f/u.

## 2011-10-25 NOTE — Assessment & Plan Note (Signed)
Hemodialysis: Doing well with this now that he is about 4 months into it. He is continuing on T/Th/Sat schedule of dialysis and he gets chemistries done regularly there.

## 2011-10-25 NOTE — Assessment & Plan Note (Signed)
He has ICD. Dr. Johnsie Cancel mentioned at his last cardiology f/u that if pt's bp's stayed above 123XX123 systolic during dialysis then he may begin low dose ACE-I or ARB.  Will attempt to get in touch with Dr. Johnsie Cancel to see if he wants me to start this med since his bps have been good.

## 2011-10-25 NOTE — Assessment & Plan Note (Addendum)
Worsened significantly since getting on HD 4 mo ago.  Testosterone measurement 1 yr ago was normal. With his low bp issues of late, I'm certainly hesitant to use a phosphodiesterase inhibitor. We'll refer him to urology for further evaluation and management of this problem.

## 2011-11-12 ENCOUNTER — Ambulatory Visit (INDEPENDENT_AMBULATORY_CARE_PROVIDER_SITE_OTHER): Payer: Managed Care, Other (non HMO) | Admitting: Cardiovascular Disease

## 2011-11-12 ENCOUNTER — Encounter: Payer: Self-pay | Admitting: Cardiovascular Disease

## 2011-11-12 VITALS — BP 120/84 | HR 68 | Ht 67.0 in | Wt 176.4 lb

## 2011-11-12 DIAGNOSIS — M329 Systemic lupus erythematosus, unspecified: Secondary | ICD-10-CM

## 2011-11-12 DIAGNOSIS — I2510 Atherosclerotic heart disease of native coronary artery without angina pectoris: Secondary | ICD-10-CM

## 2011-11-12 DIAGNOSIS — I10 Essential (primary) hypertension: Secondary | ICD-10-CM

## 2011-11-12 DIAGNOSIS — I255 Ischemic cardiomyopathy: Secondary | ICD-10-CM

## 2011-11-12 DIAGNOSIS — I2589 Other forms of chronic ischemic heart disease: Secondary | ICD-10-CM

## 2011-11-12 DIAGNOSIS — Z95810 Presence of automatic (implantable) cardiac defibrillator: Secondary | ICD-10-CM

## 2011-11-12 DIAGNOSIS — E782 Mixed hyperlipidemia: Secondary | ICD-10-CM

## 2011-11-12 DIAGNOSIS — N189 Chronic kidney disease, unspecified: Secondary | ICD-10-CM

## 2011-11-12 MED ORDER — LOSARTAN POTASSIUM 25 MG PO TABS: 25.0000 mg | ORAL_TABLET | Freq: Every day | ORAL | Status: DC

## 2011-11-12 NOTE — Patient Instructions (Signed)
Your physician wants you to follow-up in:   6 months WITH DR Blima Singer will receive a reminder letter in the mail two months in advance. If you don't receive a letter, please call our office to schedule the follow-up appointment. Your physician has recommended you make the following change in your medication: START LOSARTAN  25 MG 1 EVERY DAY

## 2011-11-12 NOTE — Progress Notes (Signed)
Patient ID: Roy Dyer, male   DOB: 08-Feb-1966, 46 y.o.   MRN: [id number] Mr. Bristow is a 46 year old male with a history of coronary artery disease who suffered anterior MI in March of 2012. He underwent bare metal stenting to his LAD at that time. Had previous stents to circ and RCA in 2001 At discharge, he was placed on a LifeVest because of depressed LV function. The patient was maintained on optimal medical therapy. His ejection fraction was reassessed 90 days post revascularization and was found to still be less than 35%. Had BiV AICD placed by Dr [name] 11/20/10. DFT;s not tested due to history of large mural apical thrombus. Repeat MRI done 11/17/10 showed resolution of thrombus and EF 28% Coumadin stopped  Had hip replaced in 3/12  and had worse renal failure and is now on dialysis. Recent remote AICD check last week was fine   Nausea resolved off amiodarone.   BP staying good on dialysis  Being evaluated at [facility] for renal transplant  Echo 3/13 Study Conclusions  - Left ventricle: The cavity size was moderately dilated. Wall thickness was increased in a pattern of mild LVH. The estimated ejection fraction was 35%. Akinesis of the mid-distalanteroseptal and apical myocardium; consistent with infarction. No evidence of thrombus. Acoustic contrast opacification revealed no evidence ofthrombus. - Mitral valve: Mild regurgitation. - Left atrium: The atrium was moderately dilated. - Right atrium: The atrium was mildly dilated. - Atrial septum: No defect or patent foramen ovale was identified. - Pulmonary arteries: PA peak pressure: 35mm Hg (S).    BP borderline at dialysis and beta blocker decreased  ROS: Denies fever, malais, weight loss, blurry vision, decreased visual acuity, cough, sputum, SOB, hemoptysis, pleuritic pain, palpitaitons, heartburn, abdominal pain, melena, lower extremity edema, claudication, or rash.  All other systems reviewed and negative  General: Affect  appropriate Healthy:  appears stated age 73: normal Neck supple with no adenopathy JVP normal no bruits no thyromegaly Lungs clear with no wheezing and good diaphragmatic motion Heart:  S1/S2 /SEM  no rub, gallop or click PMI normal Abdomen: benighn, BS positve, no tenderness, no AAA no bruit.  No HSM or HJR Distal pulses intact with no bruits No edema Neuro non-focal Skin warm and dry bruising both shins from trauma No muscular weakness Fistula LUE with good thrill    Current Outpatient Prescriptions  Medication Sig Dispense Refill  . allopurinol (ZYLOPRIM) 300 MG tablet Take 1 tablet (300 mg total) by mouth daily.  30 tablet  3  . aspirin 81 MG tablet Take 1 tablet (81 mg total) by mouth daily.  30 tablet  2  . atorvastatin (LIPITOR) 80 MG tablet Take 1 tablet (80 mg total) by mouth daily.  30 tablet  2  . calcitRIOL (ROCALTROL) 0.25 MCG capsule Take 1 capsule (0.25 mcg total) by mouth daily.  30 capsule  5  . calcium acetate (PHOSLO) 667 MG capsule Take 1 capsule (667 mg total) by mouth 3 (three) times daily with meals.  90 capsule  2  . clopidogrel (PLAVIX) 75 MG tablet Take 1 tablet (75 mg total) by mouth daily.  30 tablet  2  . esomeprazole (NEXIUM) 40 MG capsule Take 1 capsule (40 mg total) by mouth daily before breakfast.  30 capsule  2  . fluticasone (FLOVENT HFA) 44 MCG/ACT inhaler Inhale 1 puff into the lungs 2 (two) times daily.  1 Inhaler  2  . furosemide (LASIX) 40 MG tablet Take 1 tablet (40 mg  total) by mouth daily as needed. For fluid.  30 tablet  2  . metoprolol (LOPRESSOR) 50 MG tablet Take 50 mg by mouth 2 (two) times daily.      . niacin (NIASPAN) 500 MG CR tablet Take 2 tablets (1,000 mg total) by mouth at bedtime.  30 tablet  2  . NITROSTAT 0.4 MG SL tablet USE 1 TAB UNDER TONGUE EVERY 5 MINUTES UP TO 3 DOSES AS NEEDED  25 tablet  2  . promethazine (PHENERGAN) 12.5 MG tablet Take 12.5 mg by mouth every 6 (six) hours as needed.      . traMADol (ULTRAM) 50 MG  tablet Take 1 tablet (50 mg total) by mouth every 6 (six) hours as needed for pain. For pain  30 tablet  0  . DISCONTD: calcium carbonate, dosed in mg elemental calcium, 1250 MG/5ML Take 5 mLs (500 mg of elemental calcium total) by mouth every 6 (six) hours as needed.  450 mL  2  . DISCONTD: dexlansoprazole (DEXILANT) 60 MG capsule Take 1 capsule (60 mg total) by mouth daily.  30 capsule  2    Allergies  Review of patient's allergies indicates no known allergies.  Electrocardiogram:  07/21/11  SR rate 98 poor R wave progression nonspecific ST/T wave changes Assessment and Plan

## 2011-11-12 NOTE — Assessment & Plan Note (Signed)
Given history of CAD will likely need cath before transplant.  Continue Plavix and beta blocker

## 2011-11-12 NOTE — Assessment & Plan Note (Signed)
F/U Roy Dyer Despite ischemic DCM and AICD I think he would be a good candidate for transplant.

## 2011-11-12 NOTE — Assessment & Plan Note (Signed)
Cholesterol is at goal.  Continue current dose of statin and diet Rx.  No myalgias or side effects.  F/U  LFT's in 6 months. Lab Results  Component Value Date   LDLCALC 38 06/27/2011

## 2011-11-12 NOTE — Assessment & Plan Note (Addendum)
F/U rheum.  Previously on chronic steroids which lead to hip replacement.

## 2011-11-12 NOTE — Assessment & Plan Note (Signed)
Normal function with no D/C  F/U SK

## 2011-11-12 NOTE — Assessment & Plan Note (Signed)
Will start cozaar 25mg  daily and monitor BP  Continue beta blocker. Consider changing to coreg.  Will reassess EF per [facility] transplant evaluation

## 2011-11-12 NOTE — Assessment & Plan Note (Signed)
Well controlled.  Continue current medications and low sodium Dash type diet.    

## 2011-11-19 ENCOUNTER — Telehealth: Payer: Self-pay | Admitting: *Deleted

## 2011-11-19 NOTE — Telephone Encounter (Signed)
PT CALLED IS SEEING A DR Donahue  Dec 24 2011  OFFICE NUMBER IS [phone] NEED TO [facility] .Adonis Housekeeper

## 2011-11-27 NOTE — Telephone Encounter (Signed)
LAST EKG AND  OFFICE NOTE FAXED .Adonis Housekeeper

## 2011-12-03 ENCOUNTER — Other Ambulatory Visit: Payer: Self-pay | Admitting: Cardiovascular Disease

## 2011-12-03 MED ORDER — CLOPIDOGREL BISULFATE 75 MG PO TABS: 75.0000 mg | ORAL_TABLET | Freq: Every day | ORAL | Status: DC

## 2011-12-13 ENCOUNTER — Ambulatory Visit (INDEPENDENT_AMBULATORY_CARE_PROVIDER_SITE_OTHER): Payer: Managed Care, Other (non HMO) | Admitting: *Deleted

## 2011-12-13 ENCOUNTER — Encounter: Payer: Self-pay | Admitting: Internal Medicine

## 2011-12-13 DIAGNOSIS — I509 Heart failure, unspecified: Secondary | ICD-10-CM

## 2011-12-13 DIAGNOSIS — I2589 Other forms of chronic ischemic heart disease: Secondary | ICD-10-CM

## 2011-12-13 DIAGNOSIS — Z95810 Presence of automatic (implantable) cardiac defibrillator: Secondary | ICD-10-CM

## 2011-12-13 DIAGNOSIS — I255 Ischemic cardiomyopathy: Secondary | ICD-10-CM

## 2011-12-18 LAB — REMOTE ICD DEVICE
BATTERY VOLTAGE: 3.1892 V
BRDY-0002RV: 40 {beats}/min
DEV-0020ICD: NEGATIVE
PACEART VT: 0
TOT-0002: 0
TOT-0006: 20120709000000
TZAT-0002SLOWVT: NEGATIVE
TZAT-0012SLOWVT: 200 ms
TZAT-0018SLOWVT: NEGATIVE
TZAT-0019FASTVT: 8 V
TZAT-0019SLOWVT: 8 V
TZAT-0020FASTVT: 1.5 ms
TZON-0005SLOWVT: 12
TZST-0001FASTVT: 3
TZST-0001FASTVT: 5
TZST-0001FASTVT: 6
TZST-0001SLOWVT: 5
TZST-0001SLOWVT: 6
TZST-0002FASTVT: NEGATIVE
TZST-0002FASTVT: NEGATIVE
TZST-0002SLOWVT: NEGATIVE
TZST-0002SLOWVT: NEGATIVE
TZST-0002SLOWVT: NEGATIVE
VENTRICULAR PACING ICD: 0.01 pct

## 2011-12-24 DIAGNOSIS — I509 Heart failure, unspecified: Secondary | ICD-10-CM | POA: Insufficient documentation

## 2011-12-24 DIAGNOSIS — M87051 Idiopathic aseptic necrosis of right femur: Secondary | ICD-10-CM | POA: Insufficient documentation

## 2011-12-24 DIAGNOSIS — Z9289 Personal history of other medical treatment: Secondary | ICD-10-CM | POA: Insufficient documentation

## 2011-12-24 DIAGNOSIS — I5020 Unspecified systolic (congestive) heart failure: Secondary | ICD-10-CM | POA: Insufficient documentation

## 2011-12-24 DIAGNOSIS — Z95810 Presence of automatic (implantable) cardiac defibrillator: Secondary | ICD-10-CM | POA: Insufficient documentation

## 2011-12-24 DIAGNOSIS — I5043 Acute on chronic combined systolic (congestive) and diastolic (congestive) heart failure: Secondary | ICD-10-CM | POA: Insufficient documentation

## 2011-12-24 DIAGNOSIS — M87052 Idiopathic aseptic necrosis of left femur: Secondary | ICD-10-CM

## 2011-12-24 DIAGNOSIS — Z955 Presence of coronary angioplasty implant and graft: Secondary | ICD-10-CM | POA: Insufficient documentation

## 2011-12-26 ENCOUNTER — Telehealth: Payer: Self-pay | Admitting: Cardiovascular Disease

## 2011-12-26 DIAGNOSIS — Z01811 Encounter for preprocedural respiratory examination: Secondary | ICD-10-CM

## 2011-12-26 NOTE — Telephone Encounter (Signed)
Please return call to patient regarding eval for kidney transplant, he can be reached at [phone]

## 2011-12-26 NOTE — Telephone Encounter (Signed)
LMTCB  12-26-11./CY

## 2011-12-28 NOTE — Telephone Encounter (Signed)
Fu call °Pt returning your call  °

## 2011-12-28 NOTE — Telephone Encounter (Signed)
SPOKE WITH PT IS IN PROCESS  OF HAVING KIDNEY  TRANSPLANT EVAL  NEEDS STRESS ECHO PER SHERRI AT [facility]  PHONE NUMBER [phone] FAX  NUMBER  [phone] FAX RESULTS TO LISTED NUMBER ./CY SHERRI  STRESS ECHO SCHEDULED FOR 01/04/12 AT 11:30 AM

## 2012-01-04 ENCOUNTER — Encounter: Payer: Self-pay | Admitting: Cardiovascular Disease

## 2012-01-04 ENCOUNTER — Ambulatory Visit (HOSPITAL_COMMUNITY): Payer: Managed Care, Other (non HMO) | Attending: Cardiology | Admitting: Radiology

## 2012-01-04 ENCOUNTER — Ambulatory Visit (HOSPITAL_COMMUNITY): Payer: Managed Care, Other (non HMO) | Attending: Cardiology

## 2012-01-04 DIAGNOSIS — I2589 Other forms of chronic ischemic heart disease: Secondary | ICD-10-CM

## 2012-01-04 DIAGNOSIS — R0989 Other specified symptoms and signs involving the circulatory and respiratory systems: Secondary | ICD-10-CM

## 2012-01-04 DIAGNOSIS — N186 End stage renal disease: Secondary | ICD-10-CM | POA: Insufficient documentation

## 2012-01-04 DIAGNOSIS — Z01811 Encounter for preprocedural respiratory examination: Secondary | ICD-10-CM

## 2012-01-04 DIAGNOSIS — I2510 Atherosclerotic heart disease of native coronary artery without angina pectoris: Secondary | ICD-10-CM | POA: Insufficient documentation

## 2012-01-04 DIAGNOSIS — M329 Systemic lupus erythematosus, unspecified: Secondary | ICD-10-CM | POA: Insufficient documentation

## 2012-01-04 NOTE — Progress Notes (Signed)
Stress Echocardiogram performed.  

## 2012-01-09 ENCOUNTER — Encounter: Payer: Self-pay | Admitting: *Deleted

## 2012-01-10 ENCOUNTER — Encounter: Payer: Self-pay | Admitting: *Deleted

## 2012-01-11 ENCOUNTER — Telehealth: Payer: Self-pay | Admitting: Cardiovascular Disease

## 2012-01-11 NOTE — Telephone Encounter (Signed)
Patient calling to f/u on nurse call, pt said he would like to speak with someone as it is getting close to 5pm.    He can be reached at [phone]

## 2012-01-11 NOTE — Telephone Encounter (Signed)
Pt returning nurse call, he can be reached at [phone]

## 2012-01-16 NOTE — Telephone Encounter (Signed)
PT AWARE  OF STRESS ECHO RESULTS APPT  MADE FOR TOM AT 4:15 WITH DR Johnsie Cancel   WHILE GIVING RESULTS PT ALSO C/O OF LOW B/P ON DIALYSIS DAYS HAS ALMOST STOPPED ALL B/P MEDS .Adonis Housekeeper

## 2012-01-16 NOTE — Telephone Encounter (Signed)
LMTCB ./CY 

## 2012-01-16 NOTE — Telephone Encounter (Signed)
Follow up:    Patient called in wanting to speak with you. Please call back.

## 2012-01-17 ENCOUNTER — Ambulatory Visit (INDEPENDENT_AMBULATORY_CARE_PROVIDER_SITE_OTHER): Payer: Managed Care, Other (non HMO) | Admitting: Cardiovascular Disease

## 2012-01-17 ENCOUNTER — Encounter: Payer: Self-pay | Admitting: Cardiovascular Disease

## 2012-01-17 VITALS — BP 96/68 | HR 88 | Ht 67.0 in | Wt 173.4 lb

## 2012-01-17 DIAGNOSIS — I2589 Other forms of chronic ischemic heart disease: Secondary | ICD-10-CM

## 2012-01-17 DIAGNOSIS — N189 Chronic kidney disease, unspecified: Secondary | ICD-10-CM

## 2012-01-17 DIAGNOSIS — I2510 Atherosclerotic heart disease of native coronary artery without angina pectoris: Secondary | ICD-10-CM

## 2012-01-17 DIAGNOSIS — I255 Ischemic cardiomyopathy: Secondary | ICD-10-CM

## 2012-01-17 DIAGNOSIS — E782 Mixed hyperlipidemia: Secondary | ICD-10-CM

## 2012-01-17 DIAGNOSIS — Z95810 Presence of automatic (implantable) cardiac defibrillator: Secondary | ICD-10-CM

## 2012-01-17 NOTE — Assessment & Plan Note (Signed)
Normal device function with no shocks

## 2012-01-17 NOTE — Assessment & Plan Note (Signed)
On dialysis  Fistula in LUE good thrill.  Midodrine or florinef would not be ideal with 3 coronary stents and decreased EF May need to liberalize dry weight.

## 2012-01-17 NOTE — Progress Notes (Signed)
Patient ID: Roy Dyer, male   DOB: 1966/05/02, 46 y.o.   MRN: [id number] Mr. Stoen is a 46 year old male with a history of coronary artery disease who suffered anterior MI in March of 2012. He underwent bare metal stenting to his LAD at that time. Had previous stents to circ and RCA in 2001 At discharge, he was placed on a LifeVest because of depressed LV function. The patient was maintained on optimal medical therapy. His ejection fraction was reassessed 90 days post revascularization and was found to still be less than 35%. Had BiV AICD placed by Dr [name] 11/20/10. DFT;s not tested due to history of large mural apical thrombus. Repeat MRI done 11/17/10 showed resolution of thrombus and EF 28% Coumadin stopped  Had hip replaced in 3/12 and had worse renal failure and is now on dialysis. Makes very little urine and weight continues to drop Lost over 70lbs over the last year  Recent remote AICD check last week was fine  Nausea resolved off amiodarone.  BP quite low on dialysis and has to hold ACE and frequently coreg  Being evaluated at [facility] for renal transplant  Echo 3/13  Study Conclusions  - Left ventricle: The cavity size was moderately dilated. Wall thickness was increased in a pattern of mild LVH. The estimated ejection fraction was 35%. Akinesis of the mid-distalanteroseptal and apical myocardium; consistent with infarction. No evidence of thrombus. Acoustic contrast opacification revealed no evidence ofthrombus. - Mitral valve: Mild regurgitation. - Left atrium: The atrium was moderately dilated. - Right atrium: The atrium was mildly dilated. - Atrial septum: No defect or patent foramen ovale was identified. - Pulmonary arteries: PA peak pressure: 77mm Hg (S).  Recent stress echo with septal and apical infarct ? Inferior ischemia.  No chest pain.    ROS: Denies fever, malais, weight loss, blurry vision, decreased visual acuity, cough, sputum, SOB, hemoptysis, pleuritic pain,  palpitaitons, heartburn, abdominal pain, melena, lower extremity edema, claudication, or rash.  All other systems reviewed and negative  General: Affect appropriate Chronically ill male HEENT: normal Neck supple with no adenopathy JVP normal no bruits no thyromegaly Lungs clear with no wheezing and good diaphragmatic motion Heart:  S1/S2 no murmur, no rub, gallop or click PMI normal Abdomen: benighn, BS positve, no tenderness, no AAA no bruit.  No HSM or HJR Distal pulses intact with no bruits Fistula LUE with thrill No edema Neuro non-focal Skin warm and dry No muscular weakness   Current Outpatient Prescriptions  Medication Sig Dispense Refill  . allopurinol (ZYLOPRIM) 300 MG tablet Take 1 tablet (300 mg total) by mouth daily.  30 tablet  3  . aspirin 81 MG tablet Take 1 tablet (81 mg total) by mouth daily.  30 tablet  2  . atorvastatin (LIPITOR) 80 MG tablet Take 1 tablet (80 mg total) by mouth daily.  30 tablet  2  . calcium acetate (PHOSLO) 667 MG capsule Take 1 capsule (667 mg total) by mouth 3 (three) times daily with meals.  90 capsule  2  . clopidogrel (PLAVIX) 75 MG tablet Take 1 tablet (75 mg total) by mouth daily.  30 tablet  5  . esomeprazole (NEXIUM) 40 MG capsule Take 1 capsule (40 mg total) by mouth daily before breakfast.  30 capsule  2  . fluticasone (FLOVENT HFA) 44 MCG/ACT inhaler Inhale 1 puff into the lungs 2 (two) times daily.  1 Inhaler  2  . metoprolol (LOPRESSOR) 50 MG tablet Take 50 mg by mouth  2 (two) times daily as needed.       . niacin (NIASPAN) 500 MG CR tablet Take 2 tablets (1,000 mg total) by mouth at bedtime.  30 tablet  2  . NITROSTAT 0.4 MG SL tablet USE 1 TAB UNDER TONGUE EVERY 5 MINUTES UP TO 3 DOSES AS NEEDED  25 tablet  2  . promethazine (PHENERGAN) 12.5 MG tablet Take 12.5 mg by mouth every 6 (six) hours as needed.      . traMADol (ULTRAM) 50 MG tablet Take 1 tablet (50 mg total) by mouth every 6 (six) hours as needed for pain. For pain  30  tablet  0  . DISCONTD: calcium carbonate, dosed in mg elemental calcium, 1250 MG/5ML Take 5 mLs (500 mg of elemental calcium total) by mouth every 6 (six) hours as needed.  450 mL  2  . DISCONTD: dexlansoprazole (DEXILANT) 60 MG capsule Take 1 capsule (60 mg total) by mouth daily.  30 capsule  2    Allergies  Review of patient's allergies indicates no known allergies.  Electrocardiogram:  07/21/11  NSR anterior MI old   Assessment and Plan

## 2012-01-17 NOTE — Assessment & Plan Note (Signed)
No angina  Stress echo would suggest that cath needed before any possible renal transplant Medical Rx limited by low BP.  Continue ASA and beta blocker If he developes chest pain will need cath

## 2012-01-17 NOTE — Patient Instructions (Signed)
Your physician recommends that you schedule a follow-up appointment in:  3 MONTHS WITH  DR NISHAN  Your physician recommends that you continue on your current medications as directed. Please refer to the Current Medication list given to you today.  

## 2012-01-17 NOTE — Assessment & Plan Note (Signed)
Cholesterol is at goal.  Continue current dose of statin and diet Rx.  No myalgias or side effects.  F/U  LFT's in 6 months. Lab Results  Component Value Date   LDLCALC 38 06/27/2011

## 2012-01-17 NOTE — Assessment & Plan Note (Signed)
Unable to take ACE regularly due to hypotension Not postural but systolic BP only 90 mmHg.  Continue coreg especially on non dialysis days.  Tends to have palpitations without it.

## 2012-02-06 ENCOUNTER — Other Ambulatory Visit: Payer: Self-pay | Admitting: *Deleted

## 2012-02-06 MED ORDER — ALLOPURINOL 300 MG PO TABS: 300.0000 mg | ORAL_TABLET | Freq: Every day | ORAL | Status: DC

## 2012-02-06 MED ORDER — CALCIUM ACETATE 667 MG PO CAPS: 667.0000 mg | ORAL_CAPSULE | Freq: Three times a day (TID) | ORAL | Status: DC

## 2012-02-06 NOTE — Telephone Encounter (Signed)
Faxed refill request received from pharmacy for [place] filled by MD on 10/12/11, #[phone] X 2  Faxed refill request received from pharmacy for [place] filled by MD on 02/22/11, #30 X 10  Last seen on 10/24/11 Follow up 04/21/12 RX sent

## 2012-02-18 ENCOUNTER — Telehealth: Payer: Self-pay | Admitting: Cardiovascular Disease

## 2012-02-18 NOTE — Telephone Encounter (Signed)
Spoke with pt, he has been having increased palpitations for the last couple weeks. He has not taken his lopressor for several days because his bp has been too low. He will try taking 1/2 tablet to help with the palpitations and hopefully not bother his bp too much.

## 2012-02-18 NOTE — Telephone Encounter (Signed)
New problem:  C/O heart skipping beat . 95/65.

## 2012-02-25 IMAGING — CR DG CHEST 2V
2 series · 2 of 2 positions shown · non-contrast
Comparison: 08/05/2010

CLINICAL DATA: Cough and short of breath

CHEST - 2 VIEW

[w chest pa]
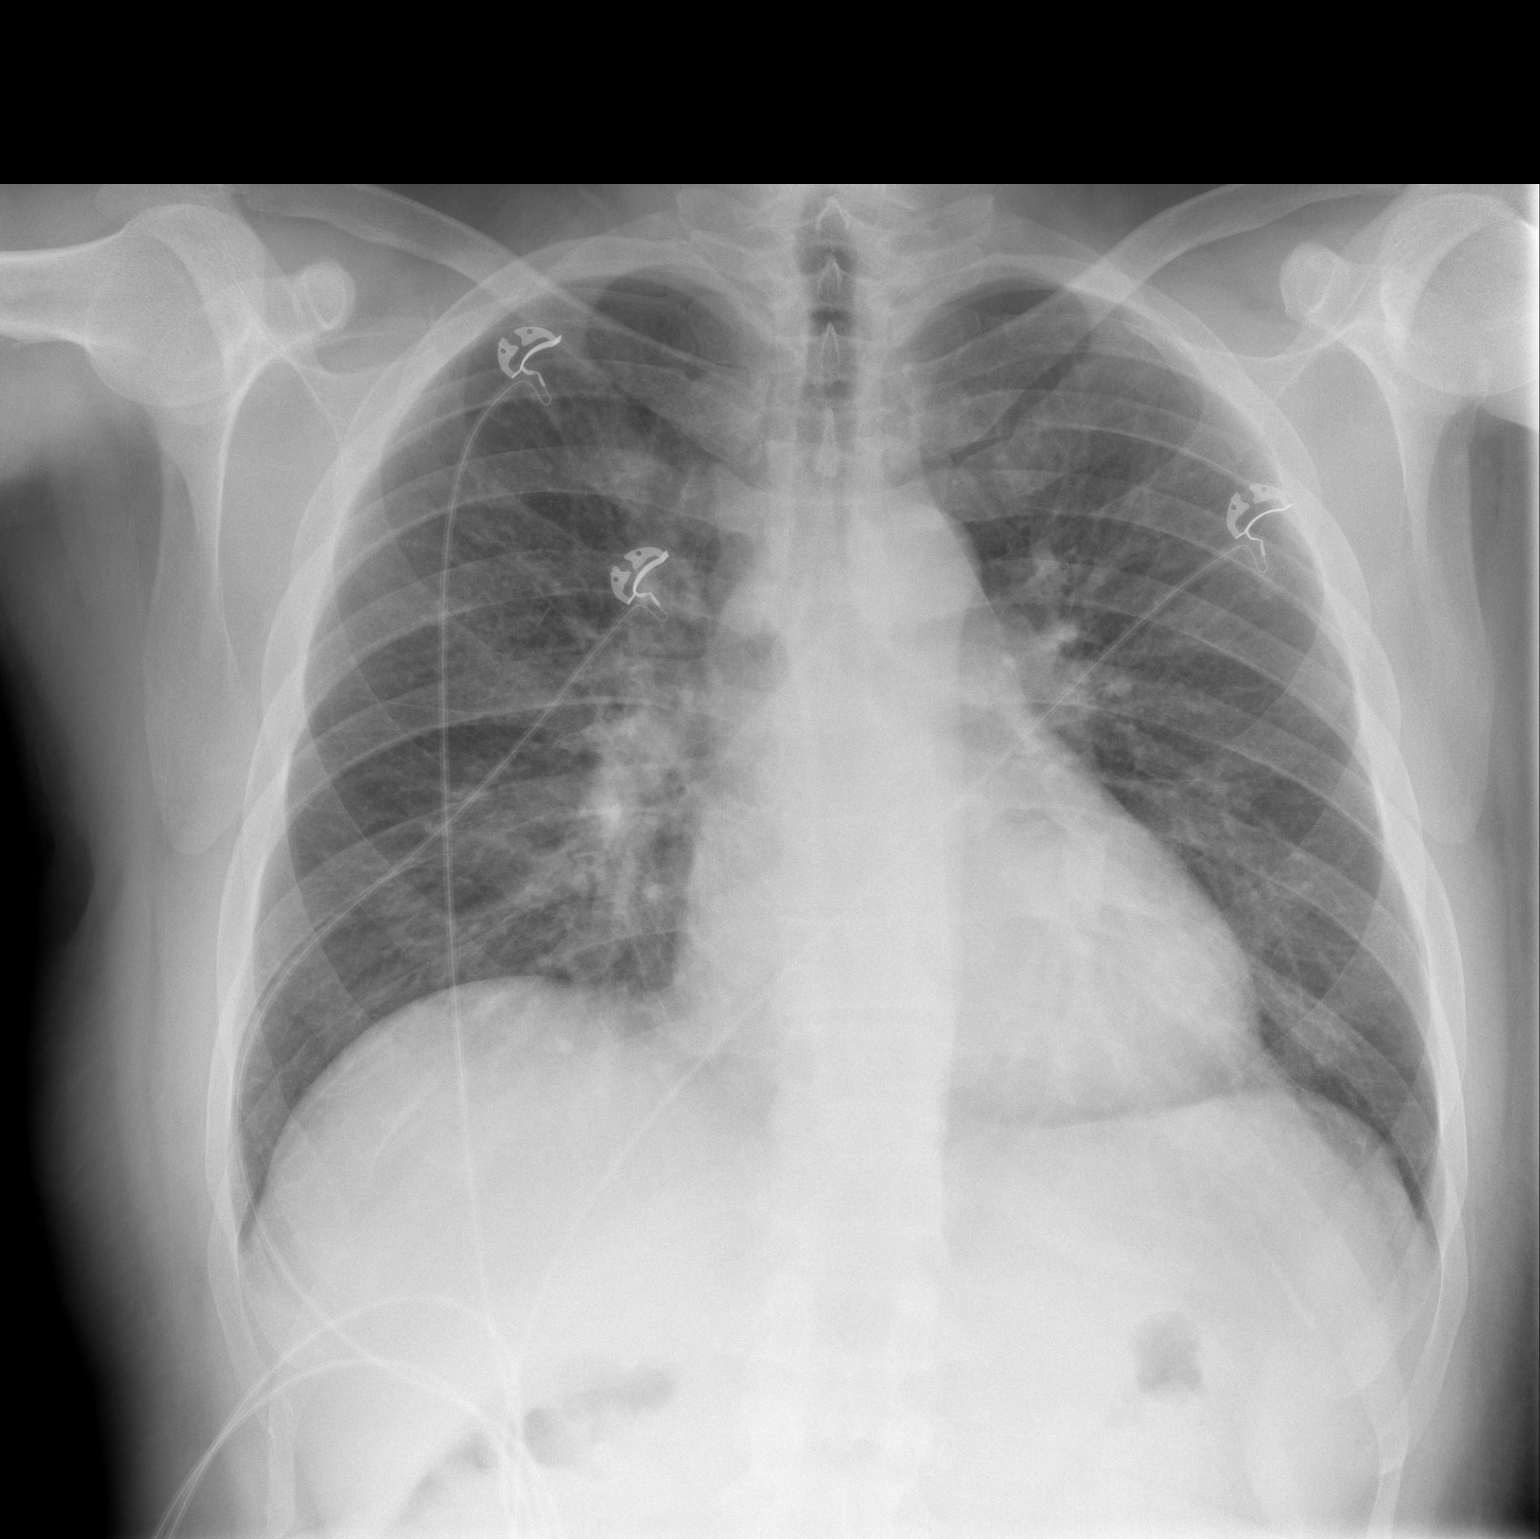

[w chest lat]
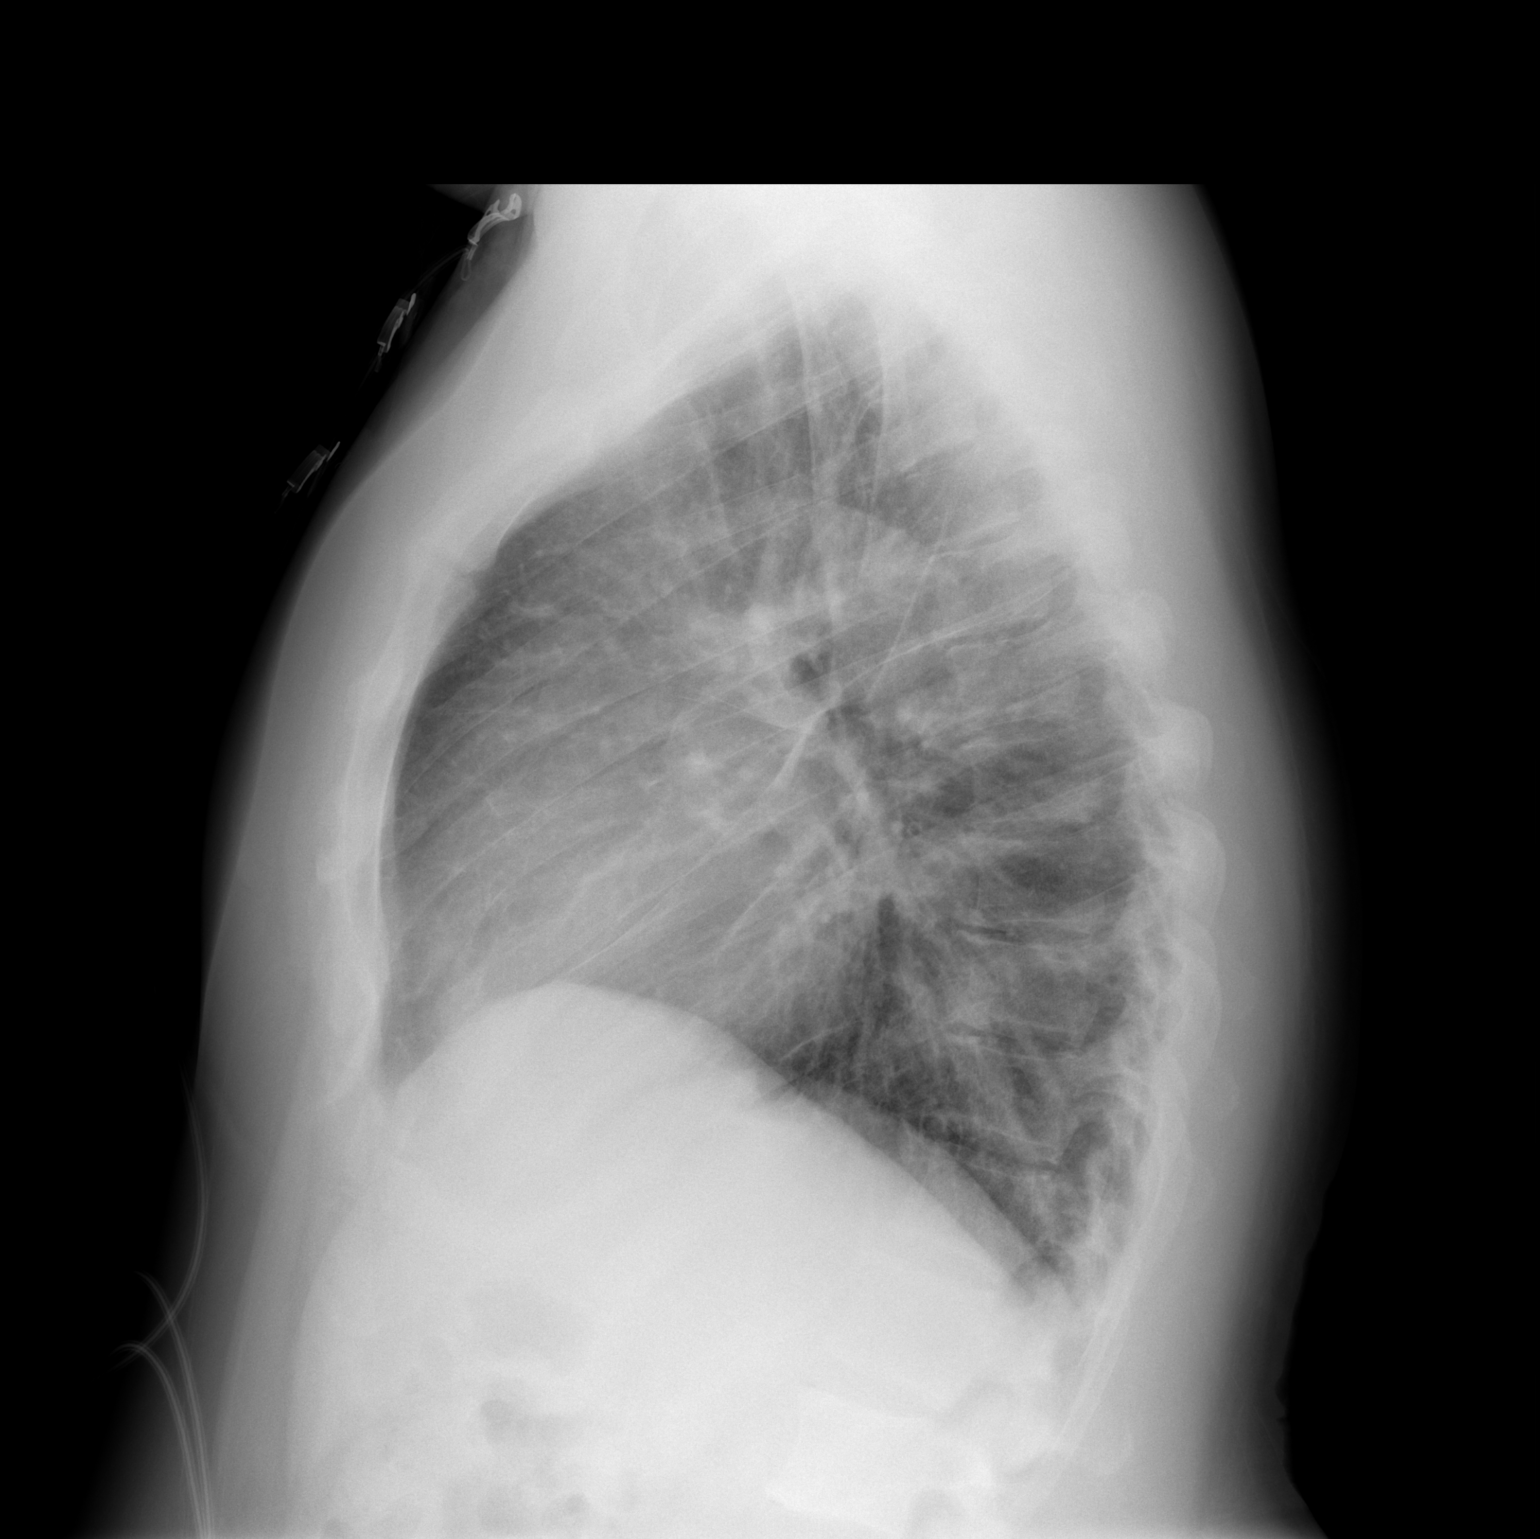

[2 of 2 positions shown; findings below may reference images not displayed]

FINDINGS: Mild vascular congestion.  Mild interstitial edema.  No
effusion.  Heart size is mildly enlarged.  Negative for pneumonia.
IMPRESSION: Findings compared with mild fluid overload and interstitial edema.

## 2012-02-28 ENCOUNTER — Telehealth: Payer: Self-pay | Admitting: Cardiovascular Disease

## 2012-02-28 NOTE — Telephone Encounter (Signed)
SPOKE WITH SHERRI  NEEDING TO KNOW  METS WHICH WAS 5.10 AND LENGTH OF TIME  EXCERCISED WHICH WAS 9:16 DURING STRESS ECHO .Adonis Housekeeper

## 2012-02-28 NOTE — Telephone Encounter (Signed)
New problem:  Need additional information concerning his exercise stress test, - need to know the mets reach, or length of time of exercise.

## 2012-03-06 ENCOUNTER — Encounter: Payer: Self-pay | Admitting: Cardiovascular Disease

## 2012-04-21 ENCOUNTER — Ambulatory Visit (INDEPENDENT_AMBULATORY_CARE_PROVIDER_SITE_OTHER): Payer: Managed Care, Other (non HMO) | Admitting: Family Medicine

## 2012-04-21 ENCOUNTER — Encounter: Payer: Self-pay | Admitting: Family Medicine

## 2012-04-21 ENCOUNTER — Ambulatory Visit (INDEPENDENT_AMBULATORY_CARE_PROVIDER_SITE_OTHER): Payer: Managed Care, Other (non HMO) | Admitting: Cardiovascular Disease

## 2012-04-21 ENCOUNTER — Encounter: Payer: Self-pay | Admitting: Cardiovascular Disease

## 2012-04-21 VITALS — BP 121/77 | HR 80 | Temp 97.6°F | Ht 67.0 in | Wt 181.0 lb

## 2012-04-21 VITALS — BP 124/82 | HR 76 | Ht 67.0 in | Wt 181.0 lb

## 2012-04-21 DIAGNOSIS — N189 Chronic kidney disease, unspecified: Secondary | ICD-10-CM

## 2012-04-21 DIAGNOSIS — I255 Ischemic cardiomyopathy: Secondary | ICD-10-CM

## 2012-04-21 DIAGNOSIS — I2589 Other forms of chronic ischemic heart disease: Secondary | ICD-10-CM

## 2012-04-21 DIAGNOSIS — N529 Male erectile dysfunction, unspecified: Secondary | ICD-10-CM

## 2012-04-21 DIAGNOSIS — I2510 Atherosclerotic heart disease of native coronary artery without angina pectoris: Secondary | ICD-10-CM

## 2012-04-21 DIAGNOSIS — M329 Systemic lupus erythematosus, unspecified: Secondary | ICD-10-CM

## 2012-04-21 DIAGNOSIS — N186 End stage renal disease: Secondary | ICD-10-CM

## 2012-04-21 DIAGNOSIS — E782 Mixed hyperlipidemia: Secondary | ICD-10-CM

## 2012-04-21 DIAGNOSIS — I10 Essential (primary) hypertension: Secondary | ICD-10-CM

## 2012-04-21 NOTE — Assessment & Plan Note (Signed)
Quiescent at this time

## 2012-04-21 NOTE — Assessment & Plan Note (Signed)
Doing well on HD.  Pt reports dry wt of 79 kg.  I'll request his last 2 sets of labs from his HD center.

## 2012-04-21 NOTE — Progress Notes (Signed)
Patient ID: Roy Dyer, male   DOB: 10-Dec-1965, 46 y.o.   MRN: [id number] Roy Dyer is a 46 year old male with a history of coronary artery disease who suffered anterior MI in March of 2012. He underwent bare metal stenting to his LAD at that time. Had previous stents to circ and RCA in 2001 At discharge, he was placed on a LifeVest because of depressed LV function. The patient was maintained on optimal medical therapy. His ejection fraction was reassessed 90 days post revascularization and was found to still be less than 35%. Had BiV AICD placed by Dr [name] 11/20/10. DFT;s not tested due to history of large mural apical thrombus. Repeat MRI done 11/17/10 showed resolution of thrombus and EF 28% Coumadin stopped  Had hip replaced in 3/12 and had worse renal failure and is now on dialysis. Makes very little urine and weight continues to drop Lost over 70lbs over the last year Recent remote AICD check last week was fine  Nausea resolved off amiodarone.  BP quite low on dialysis and has to hold ACE and frequently coreg  Being evaluated at [facility] for renal transplant  Echo 3/13  Study Conclusions  - Left ventricle: The cavity size was moderately dilated. Wall thickness was increased in a pattern of mild LVH. The estimated ejection fraction was 35%. Akinesis of the mid-distalanteroseptal and apical myocardium; consistent with infarction. No evidence of thrombus. Acoustic contrast opacification revealed no evidence ofthrombus. - Mitral valve: Mild regurgitation. - Left atrium: The atrium was moderately dilated. - Right atrium: The atrium was mildly dilated. - Atrial septum: No defect or patent foramen ovale was identified. - Pulmonary arteries: PA peak pressure: 23mm Hg (S).  Recent stress echo with septal and apical infarct ? Inferior ischemia. No chest pain.  Will need cath before any renal transplant.  Taking BP before coreg if greater than 100 No ACE still.  Dry weight 79 kg.  ROS: Denies fever,  malais, weight loss, blurry vision, decreased visual acuity, cough, sputum, SOB, hemoptysis, pleuritic pain, palpitaitons, heartburn, abdominal pain, melena, lower extremity edema, claudication, or rash.  All other systems reviewed and negative  General: Affect appropriate Chronically ill male HEENT: normal Neck supple with no adenopathy JVP normal no bruits no thyromegaly Lungs clear with no wheezing and good diaphragmatic motion Heart:  S1/S2 no murmur, no rub, gallop or click PMI normal Abdomen: benighn, BS positve, no tenderness, no AAA no bruit.  No HSM or HJR Distal pulses intact with no bruits No edema Neuro non-focal Skin warm and dry No muscular weakness Fistula with thrill LUE   Current Outpatient Prescriptions  Medication Sig Dispense Refill  . allopurinol (ZYLOPRIM) 300 MG tablet Take 1 tablet (300 mg total) by mouth daily.  30 tablet  3  . aspirin 81 MG tablet Take 1 tablet (81 mg total) by mouth daily.  30 tablet  2  . atorvastatin (LIPITOR) 80 MG tablet Take 1 tablet (80 mg total) by mouth daily.  30 tablet  2  . calcium acetate (PHOSLO) 667 MG capsule Take 1 capsule (667 mg total) by mouth 3 (three) times daily with meals.  90 capsule  3  . clopidogrel (PLAVIX) 75 MG tablet Take 1 tablet (75 mg total) by mouth daily.  30 tablet  5  . esomeprazole (NEXIUM) 40 MG capsule Take 1 capsule (40 mg total) by mouth daily before breakfast.  30 capsule  2  . fluticasone (FLOVENT HFA) 44 MCG/ACT inhaler Inhale 1 puff into the lungs  2 (two) times daily.  1 Inhaler  2  . metoprolol tartrate (LOPRESSOR) 25 MG tablet Take 0.5 tablets by mouth 2 (two) times daily. If BP Systolic 123XX123      . niacin (NIASPAN) 500 MG CR tablet Take 2 tablets (1,000 mg total) by mouth at bedtime.  30 tablet  2  . NITROSTAT 0.4 MG SL tablet USE 1 TAB UNDER TONGUE EVERY 5 MINUTES UP TO 3 DOSES AS NEEDED  25 tablet  2  . traMADol (ULTRAM) 50 MG tablet Take 1 tablet (50 mg total) by mouth every 6 (six) hours  as needed for pain. For pain  30 tablet  0  . [DISCONTINUED] calcium carbonate, dosed in mg elemental calcium, 1250 MG/5ML Take 5 mLs (500 mg of elemental calcium total) by mouth every 6 (six) hours as needed.  450 mL  2  . [DISCONTINUED] dexlansoprazole (DEXILANT) 60 MG capsule Take 1 capsule (60 mg total) by mouth daily.  30 capsule  2    Allergies  Review of patient's allergies indicates no known allergies.  Electrocardiogram:  Assessment and Plan

## 2012-04-21 NOTE — Assessment & Plan Note (Signed)
Stable with no angina and good activity level.  Continue medical Rx Stress echo abnormal but no symtoms Cath before any transplant

## 2012-04-21 NOTE — Assessment & Plan Note (Signed)
Urology recommends penile injections as his only option at this time.  He declines to do this.

## 2012-04-21 NOTE — Assessment & Plan Note (Signed)
With ischemic cardiomyopathy. He'll continue close f/u with Dr. Johnsie Cancel.

## 2012-04-21 NOTE — Patient Instructions (Addendum)
Your physician wants you to follow-up in:  6 MONTHS WITH DR NISHAN  You will receive a reminder letter in the mail two months in advance. If you don't receive a letter, please call our office to schedule the follow-up appointment. Your physician recommends that you continue on your current medications as directed. Please refer to the Current Medication list given to you today. 

## 2012-04-21 NOTE — Assessment & Plan Note (Signed)
F/u nephrology At dry weight Transplant evaluation [facility]

## 2012-04-21 NOTE — Assessment & Plan Note (Signed)
Well controlled.  Continue current medications and low sodium Dash type diet.   Quite low post dialysis limits Rx for CHF

## 2012-04-21 NOTE — Progress Notes (Signed)
OFFICE NOTE  04/21/2012  CC:  Chief Complaint  Patient presents with  . Follow-up     HPI: Patient is a 46 y.o. Caucasian male who is here for 73mo f/u HTN, CAD, ESRD, hyperlipidemia. Has been getting cardiology f/u and meds adjusted due to some hypotension/bradycardia.  Dr. Johnsie Cancel has stated that if he has chest pain or is getting closer to a sure thing regarding renal transplant then he'll need a cath first. He is still on HD on T/Th/Sat.  Dry wt 79 Kg.  He has had flu vaccine at his HD center in 02/2012.   He did get to see a urologist regarding his ED and injections were presented as his only option.  He has declined this. Denies any recent problems with bone or muscle pains, rashes, fevers, or unexplained wt loss.  Pertinent PMH:  Past Medical History  Diagnosis Date  . Lupus   . CAD (coronary artery disease)      stents RCA/Circ 2001, BMS to LAD 07/2010  . CRF (chronic renal failure)       Baseline Cr 4.5 as of 04/2011 (GFR<20), Dr. Clover Mealy:  he is being prepped for dialysis and referred to [facility] for transplant consideration.  . Cardiomyopathy     Ischemic:  02/2011 EF 30-35%.   Single chamber ICD 11/20/10 ([name])  . Gout   . 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 Left ventricular thrombus 2012    Re-eval 02/2011 showed thrombus RESOLVED, so coumadin was d/c'd (was on it for 76mo)  . implantable cardiac defibrillator single chamber     Medtronic  . Hypertension   . Anemia   . Heart attack 2001 and 2012    3 stents and defib placed in July  . GERD (gastroesophageal reflux disease)   . Hiatal hernia     MEDS:  Outpatient Prescriptions Prior to Visit  Medication Sig Dispense Refill  . allopurinol (ZYLOPRIM) 300 MG tablet Take 1 tablet (300 mg total) by mouth daily.  30 tablet  3  . aspirin 81 MG tablet Take 1 tablet (81 mg total) by mouth daily.  30 tablet  2  . atorvastatin (LIPITOR) 80 MG tablet Take 1 tablet (80 mg  total) by mouth daily.  30 tablet  2  . calcium acetate (PHOSLO) 667 MG capsule Take 1 capsule (667 mg total) by mouth 3 (three) times daily with meals.  90 capsule  3  . clopidogrel (PLAVIX) 75 MG tablet Take 1 tablet (75 mg total) by mouth daily.  30 tablet  5  . esomeprazole (NEXIUM) 40 MG capsule Take 1 capsule (40 mg total) by mouth daily before breakfast.  30 capsule  2  . fluticasone (FLOVENT HFA) 44 MCG/ACT inhaler Inhale 1 puff into the lungs 2 (two) times daily.  1 Inhaler  2  . niacin (NIASPAN) 500 MG CR tablet Take 2 tablets (1,000 mg total) by mouth at bedtime.  30 tablet  2  . NITROSTAT 0.4 MG SL tablet USE 1 TAB UNDER TONGUE EVERY 5 MINUTES UP TO 3 DOSES AS NEEDED  25 tablet  2  . traMADol (ULTRAM) 50 MG tablet Take 1 tablet (50 mg total) by mouth every 6 (six) hours as needed for pain. For pain  30 tablet  0  . [DISCONTINUED] metoprolol (LOPRESSOR) 50 MG tablet Take 50 mg by mouth 2 (two) times daily as needed.       . [  DISCONTINUED] promethazine (PHENERGAN) 12.5 MG tablet Take 12.5 mg by mouth every 6 (six) hours as needed.       Last reviewed on 04/21/2012  8:12 AM by [name], MD  PE: Blood pressure 121/77, pulse 80, temperature 97.6 F (36.4 C), temperature source Temporal, height 5\' 7"  (1.702 m), weight 181 lb (82.101 kg). Gen: Alert, well appearing.  Patient is oriented to person, place, time, and situation. Neck - No masses or thyromegaly or limitation in range of motion CV: RRR, distant S1 and S2.  No murmur appreciated. LUNGS: CTA bilat, nonlabored resps, good aeration in all lung fields.   IMPRESSION AND PLAN:  LUPUS Quiescent at this time.  End stage renal disease Doing well on HD.  Pt reports dry wt of 79 kg.  I'll request his last 2 sets of labs from his HD center.  CAD (coronary artery disease) With ischemic cardiomyopathy. He'll continue close f/u with Dr. Johnsie Cancel.  Erectile dysfunction Urology recommends penile injections as his only option at  this time.  He declines to do this.   An After Visit Summary was printed and given to the patient.  FOLLOW UP: 55mo

## 2012-04-21 NOTE — Assessment & Plan Note (Signed)
Cholesterol is at goal.  Continue current dose of statin and diet Rx.  No myalgias or side effects.  F/U  LFT's in 6 months. Lab Results  Component Value Date   LDLCALC 38 06/27/2011

## 2012-06-05 ENCOUNTER — Other Ambulatory Visit: Payer: Self-pay | Admitting: *Deleted

## 2012-06-05 MED ORDER — CLOPIDOGREL BISULFATE 75 MG PO TABS: 75.0000 mg | ORAL_TABLET | Freq: Every day | ORAL | Status: DC

## 2012-06-07 ENCOUNTER — Other Ambulatory Visit: Payer: Self-pay | Admitting: Family Medicine

## 2012-06-09 NOTE — Telephone Encounter (Signed)
eScribe request for refill on ALLOPURINOL  Last filled - 02/06/12,#30 X 3 Last seen on - 04/21/12 Follow up - 6 MONTHS RX sent

## 2012-06-28 ENCOUNTER — Other Ambulatory Visit: Payer: Self-pay

## 2012-09-06 ENCOUNTER — Other Ambulatory Visit: Payer: Self-pay | Admitting: Cardiovascular Disease

## 2012-10-12 HISTORY — PX: KIDNEY TRANSPLANT: SHX239

## 2012-10-20 ENCOUNTER — Telehealth: Payer: Self-pay | Admitting: Cardiovascular Disease

## 2012-10-20 NOTE — Telephone Encounter (Signed)
Pt scheduled for device check with [facility] on 10-22-12 @ 1015 for surgical clearance.

## 2012-10-20 NOTE — Telephone Encounter (Signed)
New Problem  Pt has a question concerning his device and an appt regarding it. He asked if someone could give him a call back.

## 2012-10-22 ENCOUNTER — Encounter: Payer: Self-pay | Admitting: Internal Medicine

## 2012-10-22 ENCOUNTER — Ambulatory Visit (INDEPENDENT_AMBULATORY_CARE_PROVIDER_SITE_OTHER): Payer: Managed Care, Other (non HMO) | Admitting: Family Medicine

## 2012-10-22 ENCOUNTER — Encounter: Payer: Self-pay | Admitting: Family Medicine

## 2012-10-22 ENCOUNTER — Ambulatory Visit (INDEPENDENT_AMBULATORY_CARE_PROVIDER_SITE_OTHER): Payer: Managed Care, Other (non HMO) | Admitting: Internal Medicine

## 2012-10-22 VITALS — BP 102/60 | HR 79 | Temp 98.1°F | Ht 67.0 in | Wt 186.2 lb

## 2012-10-22 VITALS — BP 100/61 | HR 80 | Wt 186.0 lb

## 2012-10-22 DIAGNOSIS — I255 Ischemic cardiomyopathy: Secondary | ICD-10-CM

## 2012-10-22 DIAGNOSIS — N186 End stage renal disease: Secondary | ICD-10-CM

## 2012-10-22 DIAGNOSIS — R002 Palpitations: Secondary | ICD-10-CM | POA: Insufficient documentation

## 2012-10-22 DIAGNOSIS — I2589 Other forms of chronic ischemic heart disease: Secondary | ICD-10-CM

## 2012-10-22 DIAGNOSIS — Z95810 Presence of automatic (implantable) cardiac defibrillator: Secondary | ICD-10-CM

## 2012-10-22 DIAGNOSIS — M329 Systemic lupus erythematosus, unspecified: Secondary | ICD-10-CM

## 2012-10-22 DIAGNOSIS — I10 Essential (primary) hypertension: Secondary | ICD-10-CM

## 2012-10-22 LAB — ICD DEVICE OBSERVATION
BATTERY VOLTAGE: 3.1756 V
BRDY-0002RV: 40 {beats}/min
CHARGE TIME: 9.108 s
FVT: 0
RV LEAD AMPLITUDE: 12.875 mv
RV LEAD IMPEDENCE ICD: 456 Ohm
RV LEAD THRESHOLD: 0.875 V
TOT-0001: 0
TZAT-0001FASTVT: 1
TZAT-0001SLOWVT: 1
TZAT-0002FASTVT: NEGATIVE
TZAT-0002SLOWVT: NEGATIVE
TZAT-0012FASTVT: 200 ms
TZAT-0012SLOWVT: 200 ms
TZAT-0018FASTVT: NEGATIVE
TZAT-0018SLOWVT: NEGATIVE
TZAT-0019FASTVT: 8 V
TZON-0003VSLOWVT: 350 ms
TZON-0004SLOWVT: 16
TZON-0004VSLOWVT: 32
TZON-0005SLOWVT: 12
TZST-0001FASTVT: 4
TZST-0001FASTVT: 5
TZST-0001FASTVT: 6
TZST-0001SLOWVT: 2
TZST-0001SLOWVT: 4
TZST-0002FASTVT: NEGATIVE
TZST-0002FASTVT: NEGATIVE
TZST-0002FASTVT: NEGATIVE
TZST-0002SLOWVT: NEGATIVE
TZST-0002SLOWVT: NEGATIVE
TZST-0002SLOWVT: NEGATIVE
VF: 0

## 2012-10-22 NOTE — Patient Instructions (Addendum)
Your physician recommends that you schedule a follow-up appointment in: 3 months with [facility] and in 1 year with [name]

## 2012-10-22 NOTE — Progress Notes (Signed)
Patient Care Team: [name], MD as PCP - General (Family Medicine) Josue Hector, MD as Consulting Physician (Cardiology) Deboraha Sprang, MD as Consulting Physician (Cardiology) Louis Meckel, MD as Consulting Physician (Nephrology) Rosetta Posner, MD as Consulting Physician (Vascular Surgery) Hanley Ben, MD as Consulting Physician (Urology)   HPI  Roy Dyer is a 47 y.o. male   Seen in followup for ICD implanted for primary prevention. He has primarily an ischemic myopathy status post LAD occlusion and bare-metal stenti with stentng of his LAD with prior stenting of the circumflex and RCA.  MRI 7/12 demonstrated EF 20% Echocardiogram 3/13 EF 35% LVH.  He is currently on dialysis and is anticipating renal transplantation on June 30.  He has had some issues with palpitations. This is most notable on days when he does not take his beta blocker as his blood pressure with dialysis precludes it    Past Medical History  Diagnosis Date  . Lupus   . CAD (coronary artery disease)      stents RCA/Circ 2001, BMS to LAD 07/2010  . CRF (chronic renal failure)     HD T/Th/Sat (Dr. Levester Fresh transplant 11/10/12  . Cardiomyopathy     Ischemic:  02/2011 EF 30-35%.   Single chamber ICD 11/20/10 ([name])  . Gout   . 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 Left ventricular thrombus 2012    Re-eval 02/2011 showed thrombus RESOLVED, so coumadin was d/c'd (was on it for 25mo)  . implantable cardiac defibrillator single chamber     Medtronic  . Hypertension   . Anemia   . Heart attack 2001 and 2012    3 stents and defib placed in July  . GERD (gastroesophageal reflux disease)   . Hiatal hernia     Past Surgical History  Procedure Laterality Date  . Stents      05-2010 and 2- in 2010  . Partial hip arthroplasty      left  . Renal biopsy    . Tympanostomy tube placement  47 yrs old  . Joint replacement    . Av  fistula placement  03/28/2011    Procedure: ARTERIOVENOUS (AV) FISTULA CREATION;  Surgeon: Rosetta Posner, MD;  Location: [facility];  Service: Vascular;  Laterality: Left;  Creation of Left radiocephallic cimino fistula  . Cardiac catheterization      08/2010  . US echocardiography  12/2010  . Cardiovascular stress test  07/2010  . Av fistula placement  04/27/2011    Procedure: ARTERIOVENOUS (AV) FISTULA CREATION;  Surgeon: Rosetta Posner, MD;  Location: [facility];  Service: Vascular;  Laterality: Left;  left basilic vein transposition  . Insert / replace / remove pacemaker      medtronic        dr Juleen China    [place]   icd only  . Total hip arthroplasty  07/09/2011    Procedure: TOTAL HIP ARTHROPLASTY;  Surgeon: Kerin Salen, MD;  Location: [facility];  Service: Orthopedics;  Laterality: Right;    Current Outpatient Prescriptions  Medication Sig Dispense Refill  . allopurinol (ZYLOPRIM) 300 MG tablet TAKE 1 TABLET (300 MG TOTAL) BY MOUTH DAILY.  30 tablet  5  . aspirin 81 MG tablet Take 1 tablet (81 mg total) by mouth daily.  30 tablet  2  . atorvastatin (LIPITOR) 80 MG tablet Take 1 tablet (80 mg total) by mouth  daily.  30 tablet  2  . calcium acetate (PHOSLO) 667 MG capsule Take 1 capsule (667 mg total) by mouth 3 (three) times daily with meals.  90 capsule  3  . clopidogrel (PLAVIX) 75 MG tablet Take 1 tablet (75 mg total) by mouth daily.  30 tablet  5  . esomeprazole (NEXIUM) 40 MG capsule Take 1 capsule (40 mg total) by mouth daily before breakfast.  30 capsule  2  . fluticasone (FLOVENT HFA) 44 MCG/ACT inhaler Inhale 1 puff into the lungs 2 (two) times daily.  1 Inhaler  2  . metoprolol tartrate (LOPRESSOR) 25 MG tablet Take 25 mg by mouth daily. If BP Systolic 123XX123      . niacin (NIASPAN) 500 MG CR tablet Take 2 tablets (1,000 mg total) by mouth at bedtime.  30 tablet  2  . NITROSTAT 0.4 MG SL tablet USE 1 TAB UNDER TONGUE EVERY 5 MINUTES UP TO 3 DOSES AS NEEDED  25 tablet  2  . traMADol (ULTRAM) 50 MG  tablet Take 1 tablet (50 mg total) by mouth every 6 (six) hours as needed for pain. For pain  30 tablet  0  . [DISCONTINUED] calcium carbonate, dosed in mg elemental calcium, 1250 MG/5ML Take 5 mLs (500 mg of elemental calcium total) by mouth every 6 (six) hours as needed.  450 mL  2  . [DISCONTINUED] dexlansoprazole (DEXILANT) 60 MG capsule Take 1 capsule (60 mg total) by mouth daily.  30 capsule  2   No current facility-administered medications for this visit.    No Known Allergies  Review of Systems negative except from HPI and PMH  Physical Exam BP 100/61  Pulse 80  Wt 186 lb (84.369 kg)  BMI 29.12 kg/m2 Well developed and nourished in no acute distress HENT normal Neck supple with JVP-flat Clear Regular rate and rhythm, no murmurs or gallops Abd-soft with active BS No Clubbing cyanosis edema Skin-warm and dry A & Oriented  Grossly normal sensory and motor function  of   Assessment and  Plan

## 2012-10-22 NOTE — Assessment & Plan Note (Signed)
The patient has had both ventricular ectopy as well as sinus tachycardia identified on his device. The temporal relationship to his   holding his beta blocker clear in his own mind. Hopefully this will get  remedied  following his transplantation

## 2012-10-22 NOTE — Progress Notes (Signed)
OFFICE NOTE  10/22/2012  CC:  Chief Complaint  Patient presents with  . Follow-up    6 months     HPI: Patient is a 47 y.o. Caucasian male who is here for 6 mo f/u ESRD, lupus, CAD. Feeling good, is set for renal transplant 11/10/12. Only problem is that since being on dialysis he has had lower bps and has only been able to tolerate his metoprolol at low doses and irregular intervals.  Due to this, he has noted more palpitations/irregular heart beat.  It happens for about 10-72min and gets to a rate of about 120---no CP, SOB, or dizziness.    ROS: no fevers, no cough, no extremity pains, no rash, no arthritis  Pertinent PMH:  Past Medical History  Diagnosis Date  . Lupus   . CAD (coronary artery disease)      stents RCA/Circ 2001, BMS to LAD 07/2010  . CRF (chronic renal failure)     HD T/Th/Sat (Dr. Levester Fresh transplant 11/10/12  . Cardiomyopathy     Ischemic:  02/2011 EF 30-35%.   Single chamber ICD 11/20/10 ([name])  . Gout   . 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 Left ventricular thrombus 2012    Re-eval 02/2011 showed thrombus RESOLVED, so coumadin was d/c'd (was on it for 58mo)  . implantable cardiac defibrillator single chamber     Medtronic  . Hypertension   . Anemia   . Heart attack 2001 and 2012    3 stents and defib placed in July  . GERD (gastroesophageal reflux disease)   . Hiatal hernia    Past Surgical History  Procedure Laterality Date  . Stents      05-2010 and 2- in 2010  . Partial hip arthroplasty      left  . Renal biopsy    . Tympanostomy tube placement  47 yrs old  . Joint replacement    . Av fistula placement  03/28/2011    Procedure: ARTERIOVENOUS (AV) FISTULA CREATION;  Surgeon: Rosetta Posner, MD;  Location: [facility];  Service: Vascular;  Laterality: Left;  Creation of Left radiocephallic cimino fistula  . Cardiac catheterization      08/2010  . US echocardiography  12/2010  . Cardiovascular  stress test  07/2010  . Av fistula placement  04/27/2011    Procedure: ARTERIOVENOUS (AV) FISTULA CREATION;  Surgeon: Rosetta Posner, MD;  Location: [facility];  Service: Vascular;  Laterality: Left;  left basilic vein transposition  . Insert / replace / remove pacemaker      medtronic        dr Juleen China    [place]   icd only  . Total hip arthroplasty  07/09/2011    Procedure: TOTAL HIP ARTHROPLASTY;  Surgeon: Kerin Salen, MD;  Location: [facility];  Service: Orthopedics;  Laterality: Right;   History   Social History Narrative   Married, 46 y/o daught, 38 y/o son.    Occupation: Printmaker.   15 pack-yr smoking hx, quit 07/2010.   Drug Use - no   No alcohol.          MEDS:  Outpatient Prescriptions Prior to Visit  Medication Sig Dispense Refill  . allopurinol (ZYLOPRIM) 300 MG tablet TAKE 1 TABLET (300 MG TOTAL) BY MOUTH DAILY.  30 tablet  5  . aspirin 81 MG tablet Take 1 tablet (81 mg total) by mouth daily.  30 tablet  2  . atorvastatin (LIPITOR) 80 MG tablet Take 1 tablet (80 mg total) by mouth daily.  30 tablet  2  . calcium acetate (PHOSLO) 667 MG capsule Take 1 capsule (667 mg total) by mouth 3 (three) times daily with meals.  90 capsule  3  . clopidogrel (PLAVIX) 75 MG tablet Take 1 tablet (75 mg total) by mouth daily.  30 tablet  5  . esomeprazole (NEXIUM) 40 MG capsule Take 1 capsule (40 mg total) by mouth daily before breakfast.  30 capsule  2  . fluticasone (FLOVENT HFA) 44 MCG/ACT inhaler Inhale 1 puff into the lungs 2 (two) times daily.  1 Inhaler  2  . metoprolol tartrate (LOPRESSOR) 25 MG tablet Take 0.5 tablets by mouth daily. If BP Systolic 123XX123      . niacin (NIASPAN) 500 MG CR tablet Take 2 tablets (1,000 mg total) by mouth at bedtime.  30 tablet  2  . NITROSTAT 0.4 MG SL tablet USE 1 TAB UNDER TONGUE EVERY 5 MINUTES UP TO 3 DOSES AS NEEDED  25 tablet  2  . traMADol (ULTRAM) 50 MG tablet Take 1 tablet (50 mg total) by mouth every 6 (six) hours as needed for pain. For pain   30 tablet  0  . atorvastatin (LIPITOR) 80 MG tablet TAKE 1 TABLET BY MOUTH EVERY DAY AT BEDTIME  30 tablet  10   No facility-administered medications prior to visit.    PE: Blood pressure 102/60, pulse 79, temperature 98.1 F (36.7 C), temperature source Oral, height 5\' 7"  (1.702 m), weight 186 lb 4 oz (84.482 kg), SpO2 98.00%. Gen: Alert, well appearing.  Patient is oriented to person, place, time, and situation. ENT: Ears: EACs clear, normal epithelium.  TMs with good light reflex and landmarks bilaterally.  Eyes: no injection, icteris, swelling, or exudate.  EOMI, PERRLA. Nose: no drainage or turbinate edema/swelling.  No injection or focal lesion.  Mouth: lips without lesion/swelling.  Oral mucosa pink and moist.  Dentition intact and without obvious caries or gingival swelling.  Oropharynx without erythema, exudate, or swelling.  .CV: RRR, soft systolic murmur, no ectopy LUNGS: CTA bilat, nonlabored resps--great aeration. ABD: soft, NT/ND EXT: no clubbing, cyanosis, or edema.  Left arm AV fistula with great thrill/pulsation  IMPRESSION AND PLAN:  1) ESRD from LUPUS: stable on HD.  Set for renal transplant (donor is his niece) at [facility] 11/10/12. Will obtain most recent local nephrologist's note and labs.  2) Palpitations: short term, nothing different to do at this time other than take metoprolol when bp tolerates. He has f/u with his electrophys cardiologist later today.  FOLLOW UP: 69mo

## 2012-10-22 NOTE — Assessment & Plan Note (Signed)
The patient's device was interrogated.  The information was reviewed. No changes were made in the programming.    

## 2012-11-08 IMAGING — CR DG CHEST 2V
2 series · 2 of 2 positions shown · non-contrast
Comparison: 03/28/2011

CLINICAL DATA: Shortness of breath, cough.  Hypertension.  History
of smoking.

CHEST - 2 VIEW

[w chest pa]
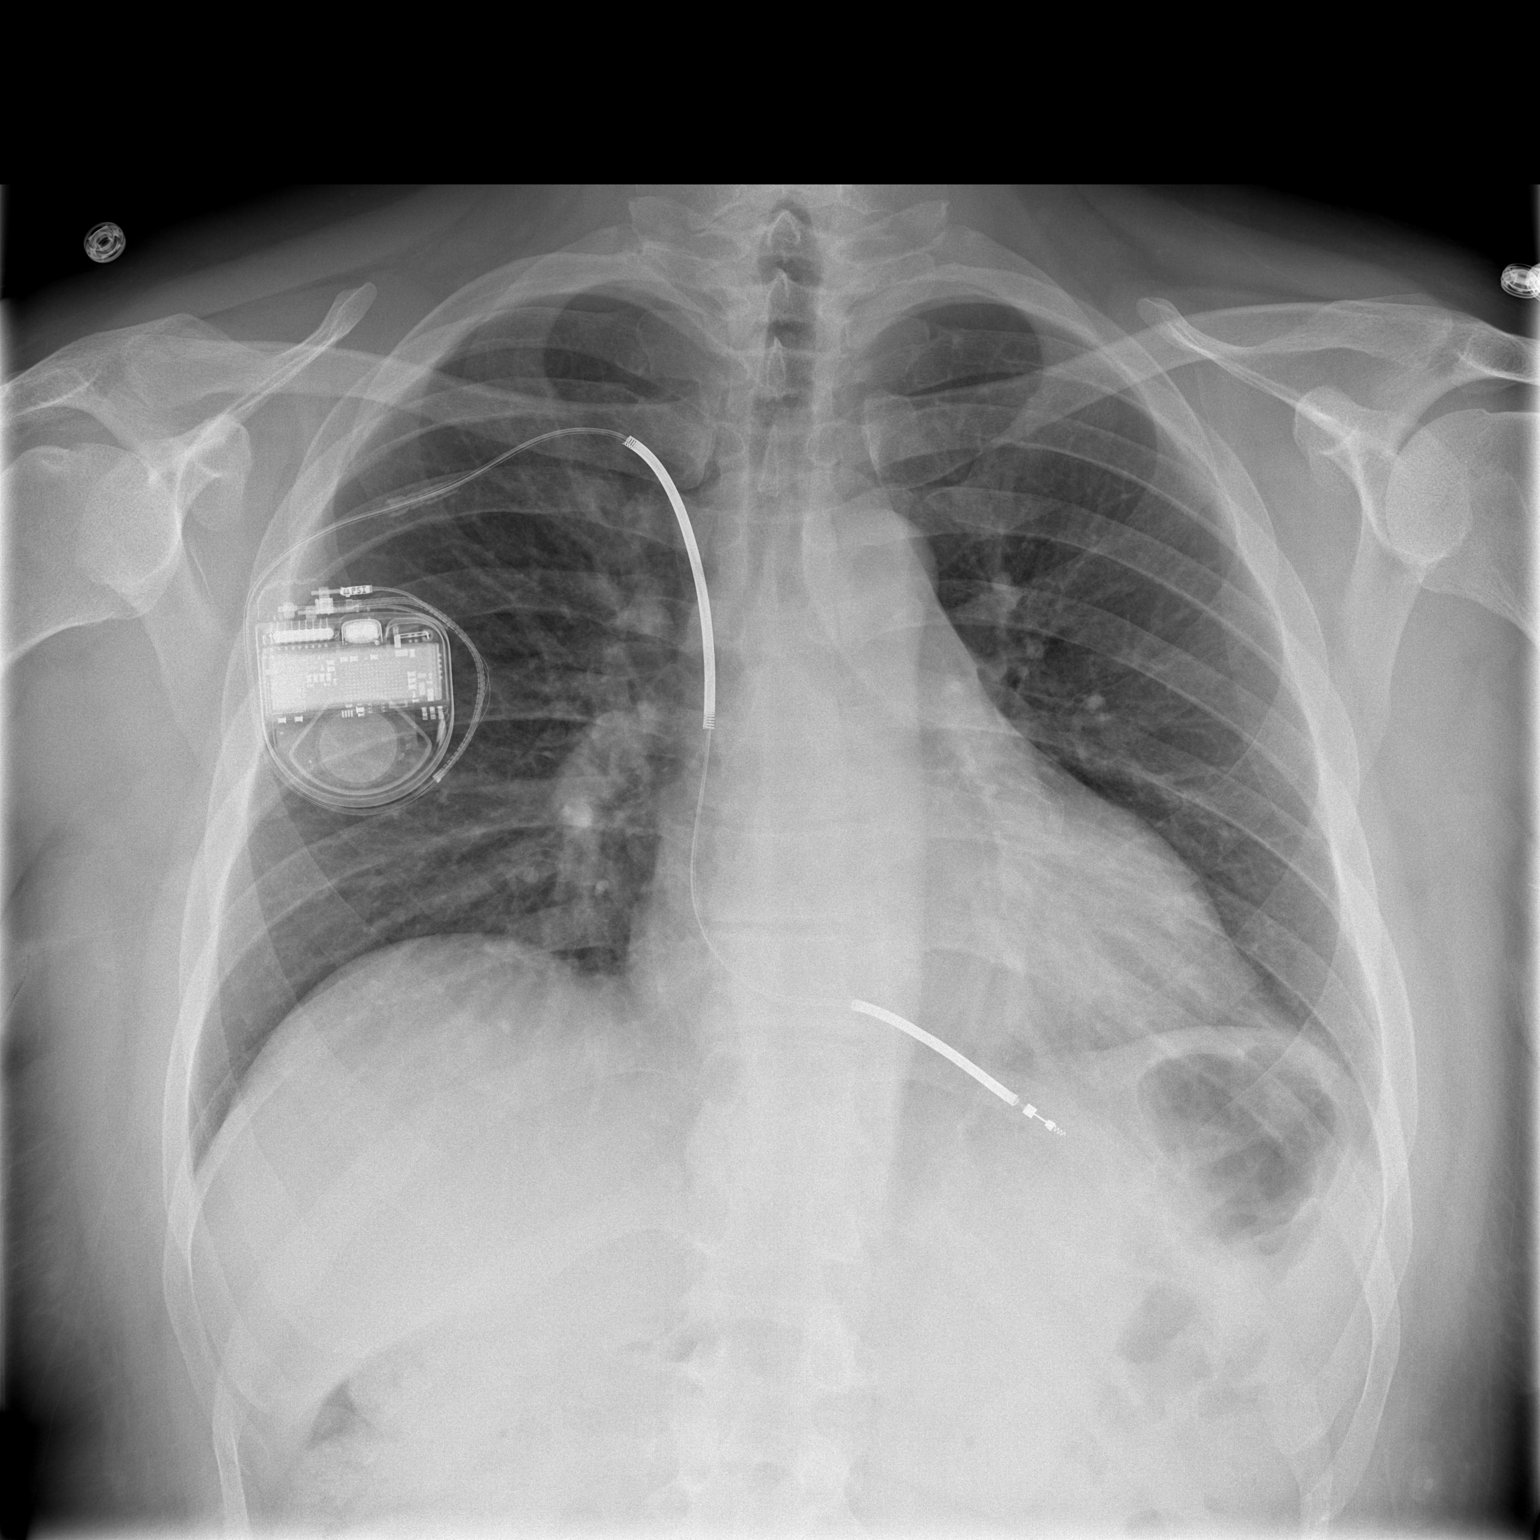

[w chest lat]
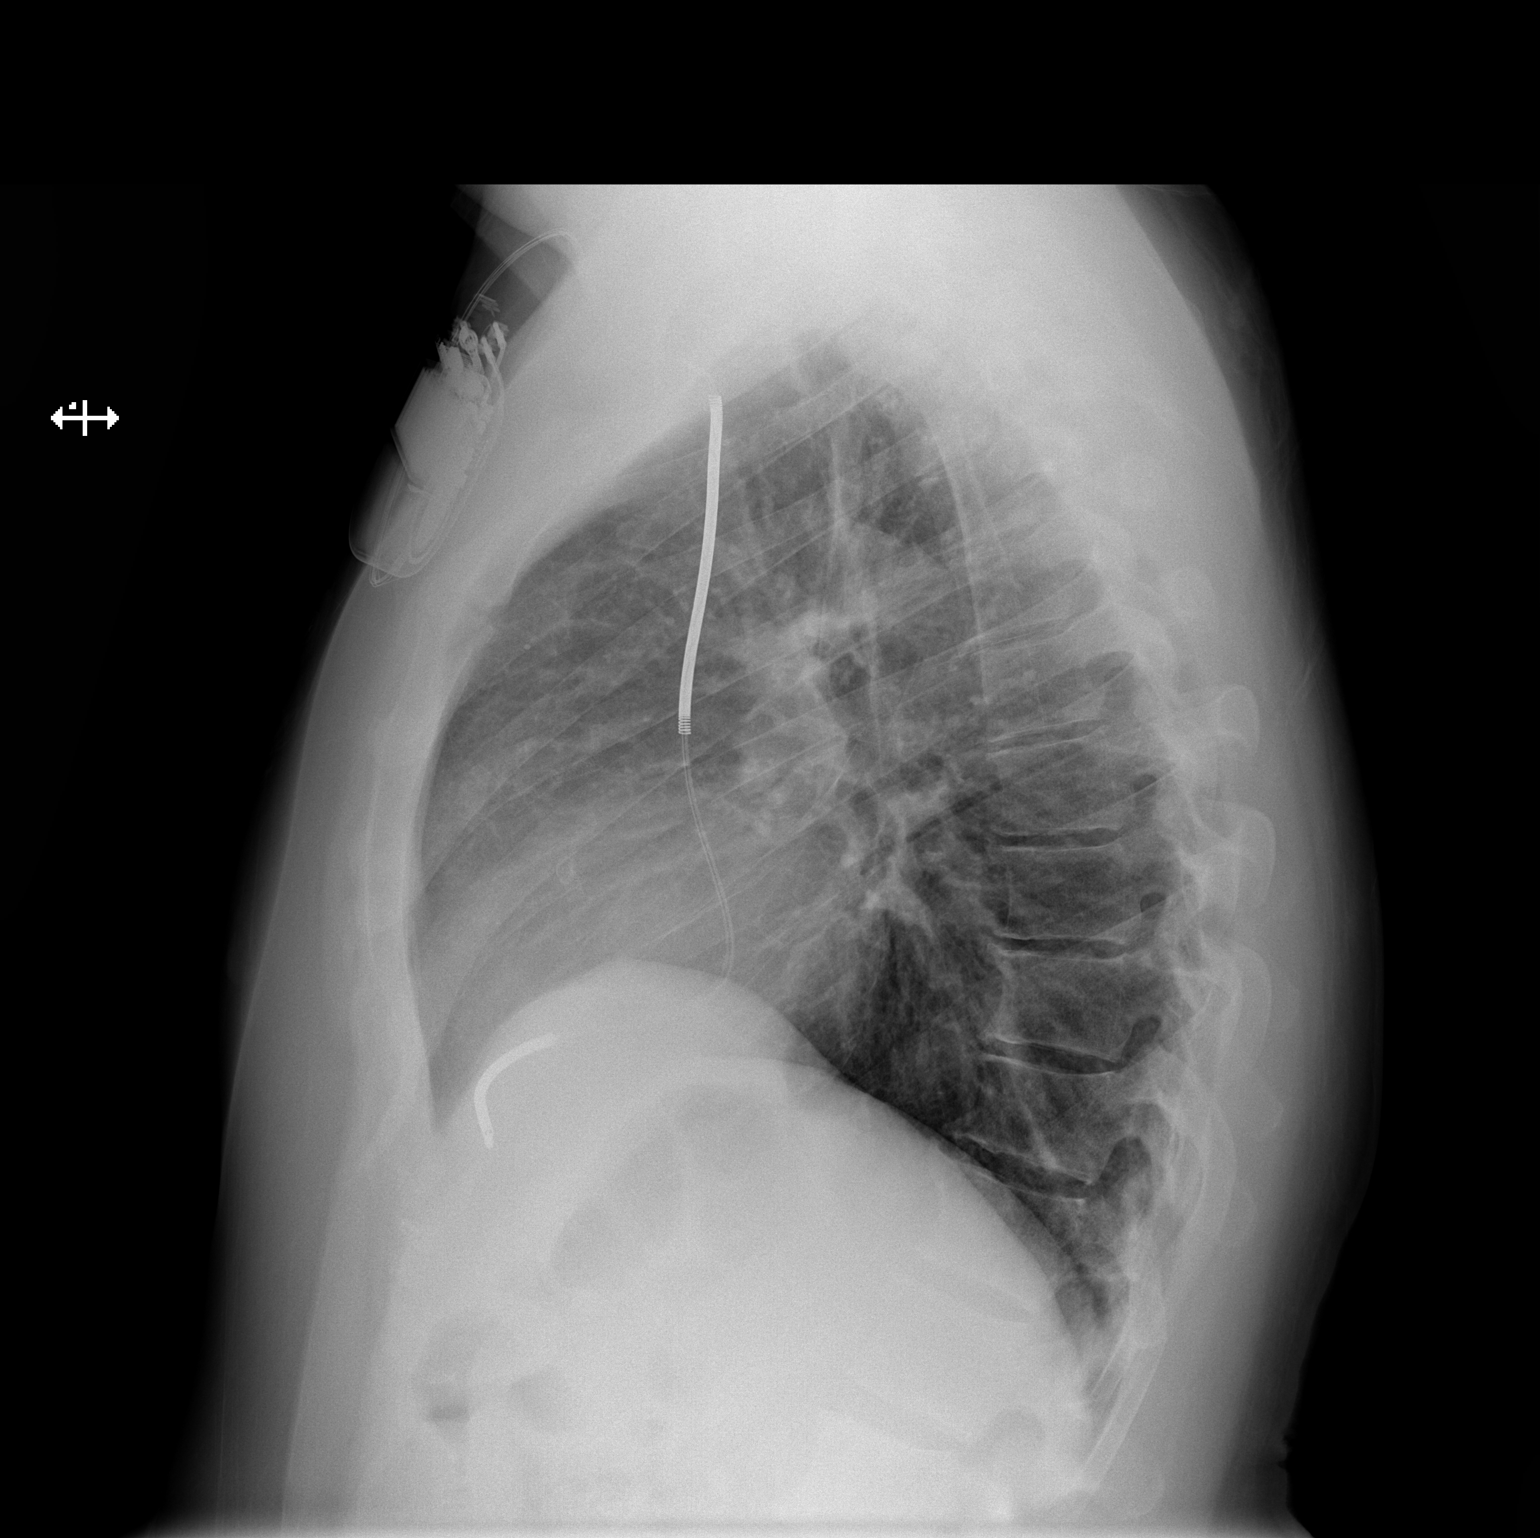

[2 of 2 positions shown; findings below may reference images not displayed]

FINDINGS: Right-sided pacemaker / AICD lead overlies right
ventricle.  Heart is enlarged.  No edema.  No focal consolidations
or pleural effusions.  Mild degenerative changes are seen in the
spine.
IMPRESSION: Cardiomegaly without pulmonary edema.

## 2012-11-10 DIAGNOSIS — Z940 Kidney transplant status: Secondary | ICD-10-CM

## 2012-11-10 HISTORY — DX: Kidney transplant status: Z94.0

## 2012-11-16 ENCOUNTER — Encounter: Payer: Self-pay | Admitting: Family Medicine

## 2012-11-25 ENCOUNTER — Encounter: Payer: Self-pay | Admitting: Family Medicine

## 2012-12-08 ENCOUNTER — Other Ambulatory Visit: Payer: Self-pay | Admitting: Family Medicine

## 2012-12-08 ENCOUNTER — Other Ambulatory Visit: Payer: Self-pay | Admitting: Cardiovascular Disease

## 2012-12-08 MED ORDER — ALLOPURINOL 300 MG PO TABS: ORAL_TABLET | ORAL | Status: DC

## 2012-12-10 ENCOUNTER — Other Ambulatory Visit: Payer: Self-pay | Admitting: *Deleted

## 2012-12-10 MED ORDER — CLOPIDOGREL BISULFATE 75 MG PO TABS: 75.0000 mg | ORAL_TABLET | Freq: Every day | ORAL | Status: DC

## 2012-12-18 ENCOUNTER — Encounter: Payer: Self-pay | Admitting: Family Medicine

## 2013-01-23 ENCOUNTER — Ambulatory Visit (INDEPENDENT_AMBULATORY_CARE_PROVIDER_SITE_OTHER): Payer: Managed Care, Other (non HMO) | Admitting: Cardiovascular Disease

## 2013-01-23 VITALS — BP 152/82 | HR 91 | Ht 67.0 in | Wt 193.8 lb

## 2013-01-23 DIAGNOSIS — I10 Essential (primary) hypertension: Secondary | ICD-10-CM

## 2013-01-23 DIAGNOSIS — E782 Mixed hyperlipidemia: Secondary | ICD-10-CM

## 2013-01-23 DIAGNOSIS — I2510 Atherosclerotic heart disease of native coronary artery without angina pectoris: Secondary | ICD-10-CM

## 2013-01-23 DIAGNOSIS — N529 Male erectile dysfunction, unspecified: Secondary | ICD-10-CM

## 2013-01-23 NOTE — Patient Instructions (Signed)
Your physician wants you to follow-up in:  6 MONTHS WITH DR NISHAN  You will receive a reminder letter in the mail two months in advance. If you don't receive a letter, please call our office to schedule the follow-up appointment. Your physician recommends that you continue on your current medications as directed. Please refer to the Current Medication list given to you today. 

## 2013-01-23 NOTE — Assessment & Plan Note (Signed)
Cholesterol is at goal.  Continue current dose of statin and diet Rx.  No myalgias or side effects.  F/U  LFT's in 6 months. Lab Results  Component Value Date   LDLCALC 38 06/27/2011

## 2013-01-23 NOTE — Progress Notes (Signed)
Patient ID: Roy Dyer, male   DOB: Sep 20, 1965, 47 y.o.   MRN: [id number] Mr. Dowis is a 47 year old male with a history of coronary artery disease who suffered anterior MI in March of 2012. He underwent bare metal stenting to his LAD at that time. Had previous stents to circ and RCA in 2001 At discharge, he was placed on a LifeVest because of depressed LV function. The patient was maintained on optimal medical therapy. His ejection fraction was reassessed 90 days post revascularization and was found to still be less than 35%. Had BiV AICD placed by Dr [name] 11/20/10. DFT;s not tested due to history of large mural apical thrombus. Repeat MRI done 11/17/10 showed resolution of thrombus and EF 28% Coumadin stopped  Had hip replaced in 3/12 and had worse renal failure and is now on dialysis. Makes very little urine and weight continues to drop Lost over 70lbs over the last year Recent remote AICD check last week was fine  Nausea resolved off amiodarone.  BP quite low on dialysis and has to hold ACE and frequently coreg  Being evaluated at [facility] for renal transplant  Echo 3/13  Study Conclusions  - Left ventricle: The cavity size was moderately dilated. Wall thickness was increased in a pattern of mild LVH. The estimated ejection fraction was 35%. Akinesis of the mid-distalanteroseptal and apical myocardium; consistent with infarction. No evidence of thrombus. Acoustic contrast opacification revealed no evidence ofthrombus. - Mitral valve: Mild regurgitation. - Left atrium: The atrium was moderately dilated. - Right atrium: The atrium was mildly dilated. - Atrial septum: No defect or patent foramen ovale was identified. - Pulmonary arteries: PA peak pressure: 76mm Hg (S).  Recent stress echo with septal and apical infarct ? Inferior ischemia. No chest pain.  Will need cath before any renal transplant. Taking BP before coreg if greater than 100   Had transplant at [facility] in June with niece as donor.   Doing well Did not have cath before procedure   ROS: Denies fever, malais, weight loss, blurry vision, decreased visual acuity, cough, sputum, SOB, hemoptysis, pleuritic pain, palpitaitons, heartburn, abdominal pain, melena, lower extremity edema, claudication, or rash.  All other systems reviewed and negative  General: Affect appropriate Healthy:  appears stated age 60: normal Neck supple with no adenopathy JVP normal no bruits no thyromegaly Lungs clear with no wheezing and good diaphragmatic motion Heart:  S1/S2 no murmur, no rub, gallop or click PMI normal Abdomen: benighn, BS positve, no tenderness, no AAA  Transplant kidney RLQ no bruit.  No HSM or HJR Distal pulses intact with no bruits No edema Neuro non-focal Skin warm and dry No muscular weakness Fistula LUE   Current Outpatient Prescriptions  Medication Sig Dispense Refill  . aspirin 81 MG tablet Take 1 tablet (81 mg total) by mouth daily.  30 tablet  2  . atorvastatin (LIPITOR) 80 MG tablet Take 1 tablet (80 mg total) by mouth daily.  30 tablet  2  . Cholecalciferol (VITAMIN D3) 5000 UNITS TABS Take by mouth.      . clopidogrel (PLAVIX) 75 MG tablet Take 1 tablet (75 mg total) by mouth daily.  30 tablet  5  . esomeprazole (NEXIUM) 40 MG capsule Take 1 capsule (40 mg total) by mouth daily before breakfast.  30 capsule  2  . mycophenolate (CELLCEPT) 500 MG tablet Take by mouth as directed. 1 1/2 tab bid      . niacin (NIASPAN) 500 MG CR tablet Take 2  tablets (1,000 mg total) by mouth at bedtime.  30 tablet  2  . NITROSTAT 0.4 MG SL tablet USE 1 TAB UNDER TONGUE EVERY 5 MINUTES UP TO 3 DOSES AS NEEDED  25 tablet  2  . predniSONE (DELTASONE) 5 MG tablet Take 5 mg by mouth daily.      [name] sulfamethoxazole-trimethoprim (BACTRIM DS) 800-160 MG per tablet Take 1 tablet by mouth 3 (three) times a week.      . tacrolimus (PROGRAF) 1 MG capsule Take 1 mg by mouth daily.      [name] allopurinol (ZYLOPRIM) 300 MG tablet TAKE 1 TABLET  (300 MG TOTAL) BY MOUTH DAILY.  30 tablet  5  . [DISCONTINUED] calcium carbonate, dosed in mg elemental calcium, 1250 MG/5ML Take 5 mLs (500 mg of elemental calcium total) by mouth every 6 (six) hours as needed.  450 mL  2  . [DISCONTINUED] dexlansoprazole (DEXILANT) 60 MG capsule Take 1 capsule (60 mg total) by mouth daily.  30 capsule  2   No current facility-administered medications for this visit.    Allergies  Review of patient's allergies indicates no known allergies.  Electrocardiogram:  SR rate 66 old anterior MI  Assessment and Plan

## 2013-01-23 NOTE — Assessment & Plan Note (Signed)
Doing well No dialysis since June.  F/U nephrology

## 2013-01-23 NOTE — Assessment & Plan Note (Signed)
Stable with no angina and good activity level.  Continue medical Rx Surprised he did not have cath before transplant

## 2013-01-23 NOTE — Assessment & Plan Note (Signed)
Seems to be a bit high. F/U renal Post transplant want to avoid excessively low BP

## 2013-01-26 ENCOUNTER — Encounter: Payer: Self-pay | Admitting: Internal Medicine

## 2013-01-26 ENCOUNTER — Telehealth: Payer: Self-pay | Admitting: *Deleted

## 2013-01-26 ENCOUNTER — Ambulatory Visit (INDEPENDENT_AMBULATORY_CARE_PROVIDER_SITE_OTHER): Payer: Managed Care, Other (non HMO) | Admitting: *Deleted

## 2013-01-26 DIAGNOSIS — I255 Ischemic cardiomyopathy: Secondary | ICD-10-CM

## 2013-01-26 DIAGNOSIS — Z95810 Presence of automatic (implantable) cardiac defibrillator: Secondary | ICD-10-CM

## 2013-01-26 NOTE — Telephone Encounter (Signed)
Called in to let us know he attempted several times to transmit his remote check.  [facility] website currently says, "pending."  Roy Dyer says his home transmitter gives a message stating, "please hang up and try again."  He will try again when he gets home.  He will call us back if he cannot successfully transmit.

## 2013-01-27 DIAGNOSIS — Z95810 Presence of automatic (implantable) cardiac defibrillator: Secondary | ICD-10-CM

## 2013-01-27 DIAGNOSIS — I2589 Other forms of chronic ischemic heart disease: Secondary | ICD-10-CM

## 2013-01-30 ENCOUNTER — Encounter: Payer: Self-pay | Admitting: Internal Medicine

## 2013-02-01 ENCOUNTER — Encounter: Payer: Self-pay | Admitting: Family Medicine

## 2013-02-02 MED ORDER — NIACIN 500 MG PO TABS: ORAL_TABLET | ORAL | Status: DC

## 2013-02-07 LAB — REMOTE ICD DEVICE
CHARGE TIME: 9.359 s
DEV-0020ICD: NEGATIVE
FVT: 0
TOT-0001: 0
TOT-0002: 0
TZAT-0012FASTVT: 200 ms
TZAT-0012SLOWVT: 200 ms
TZAT-0018FASTVT: NEGATIVE
TZAT-0018SLOWVT: NEGATIVE
TZAT-0019FASTVT: 8 V
TZAT-0019SLOWVT: 8 V
TZAT-0020FASTVT: 1.5 ms
TZAT-0020SLOWVT: 1.5 ms
TZON-0003SLOWVT: 360 ms
TZST-0001FASTVT: 2
TZST-0001FASTVT: 6
TZST-0001SLOWVT: 5
TZST-0002FASTVT: NEGATIVE
TZST-0002FASTVT: NEGATIVE
TZST-0002SLOWVT: NEGATIVE
TZST-0002SLOWVT: NEGATIVE
VENTRICULAR PACING ICD: 0.01 pct
VF: 0

## 2013-02-16 ENCOUNTER — Telehealth: Payer: Self-pay | Admitting: Cardiovascular Disease

## 2013-02-16 ENCOUNTER — Encounter: Payer: Self-pay | Admitting: *Deleted

## 2013-02-16 MED ORDER — METOPROLOL TARTRATE 25 MG PO TABS: 25.0000 mg | ORAL_TABLET | ORAL | Status: DC

## 2013-02-16 NOTE — Telephone Encounter (Signed)
Ok to restart lopressor

## 2013-02-16 NOTE — Telephone Encounter (Signed)
PER  PT   HEART  MEDS  WERE  STOPPED  SINCE  KIDNEY  TRANSPLANT,   NOTED PALPITATIONS  ALL WEEKEND   DID  NOT  KNOW IF  COULD  RESTART LOPRESSOR 25 MG  HAD  TAKEN THIS IN PAST  AND  WAS TO HOLD  IF  B/P  LESS THAN  100 WILL FORWARD TO  DR Johnsie Cancel FOR REVIEW .Adonis Housekeeper

## 2013-02-16 NOTE — Telephone Encounter (Signed)
PT AWARE   TO   RESTART  LOPRESSOR 25 MG ./CY

## 2013-02-16 NOTE — Telephone Encounter (Signed)
New Problem  Pt has recently experienced Heart palp's// direct relation to medications// medications were previously adjusted// pt requests a call back as to what to do going forward// please advise.

## 2013-02-23 ENCOUNTER — Telehealth: Payer: Self-pay | Admitting: Cardiovascular Disease

## 2013-02-23 NOTE — Telephone Encounter (Signed)
Need to see device transmission

## 2013-02-23 NOTE — Telephone Encounter (Signed)
New message   Having palpitations--rx was adjusted---no change ---still having palpitations

## 2013-02-23 NOTE — Telephone Encounter (Signed)
Per Dr Johnsie Cancel would prefer pt come in for device check rather than send transmission. Pt scheduled for 02-24-13 @ 900 with device clinic.

## 2013-02-23 NOTE — Telephone Encounter (Signed)
SPOKE  WITH PT   RE MESSAGE CONT TO COMPLAIN WITH  PALPITATIONS    PER PT  FREQUENCY   FEELS LIKE  60%  OF THE   TIME  AND ALSO  FEELS   SWEATY  BEFORE  WOULD  LAST ONLY  1-2 HOURS  RESTARTED  LOPRESSOR  AT  25 MG  BID  LAST WEEK WITH  NO RELIEF   IN PAST   WOULD  ONLY TAKE MAYBE  2  TABS PER WEEK WHEN WAS ON DIALYSIS  B/P  CURRENTLY RUNNING  AROUND  105/60  AND  HEART  RATE   70-85  HAS DEVICE  WILL SEND  TRANSMISSION THIS EVENING WHEN GETS HOME  FROM  WORK. WILL FORWARD  MESSAGE FOR DR Johnsie Cancel TO  REVIEW .Adonis Housekeeper

## 2013-02-24 ENCOUNTER — Ambulatory Visit (INDEPENDENT_AMBULATORY_CARE_PROVIDER_SITE_OTHER): Payer: Managed Care, Other (non HMO) | Admitting: *Deleted

## 2013-02-24 DIAGNOSIS — Z95810 Presence of automatic (implantable) cardiac defibrillator: Secondary | ICD-10-CM

## 2013-02-24 LAB — ICD DEVICE OBSERVATION
BATTERY VOLTAGE: 3.1551 V
DEV-0020ICD: NEGATIVE
RV LEAD AMPLITUDE: 15.75 mv
TOT-0001: 0
TOT-0006: 20120709000000
TZAT-0019SLOWVT: 8 V
TZAT-0020FASTVT: 1.5 ms
TZAT-0020SLOWVT: 1.5 ms
TZON-0003SLOWVT: 360 ms
TZON-0005SLOWVT: 12
TZST-0001FASTVT: 2
TZST-0001FASTVT: 3
TZST-0001SLOWVT: 3
TZST-0001SLOWVT: 5
TZST-0001SLOWVT: 6
TZST-0002FASTVT: NEGATIVE
TZST-0002SLOWVT: NEGATIVE
TZST-0002SLOWVT: NEGATIVE
VENTRICULAR PACING ICD: 0.01 pct

## 2013-02-24 NOTE — Progress Notes (Signed)
Pt having intermittent bigeminal PVCs--I printed several EGM snapshots while in clinic. He is very aware when they occur.  No device changes made.  I will update Dr. Johnsie Cancel.  Next scheduled home transmission 05/04/13.

## 2013-03-03 ENCOUNTER — Telehealth: Payer: Self-pay | Admitting: Cardiovascular Disease

## 2013-03-03 ENCOUNTER — Telehealth: Payer: Self-pay | Admitting: *Deleted

## 2013-03-03 NOTE — Telephone Encounter (Signed)
DEVICE  WAS  CHECKED LAST  WEEK WILL FORWARD TO  DR Johnsie Cancel FOR  REVIEW./CY

## 2013-03-03 NOTE — Telephone Encounter (Signed)
LMTCB ./CY 

## 2013-03-03 NOTE — Telephone Encounter (Signed)
New message     Still have heart palpitations----Want Dr Johnsie Cancel to review results and advise him

## 2013-03-03 NOTE — Telephone Encounter (Signed)
Looked at device just PVC;s continue beta blocker  Have him see EP for any further suggestions

## 2013-03-03 NOTE — Telephone Encounter (Signed)
Called pt to update him concerning his ICD. Per [name], no device changes will be made. Pt having bigeminal PVCs, not runs of non-sustained VT.  Next [facility] 05/04/13 & ROV w/ [name] 10/2013.

## 2013-03-04 NOTE — Telephone Encounter (Signed)
Follow Up:  Pt states he is returning Christine's phone call.

## 2013-03-04 NOTE — Telephone Encounter (Signed)
SPOKE WITH  PT    IS  AWARE OF  DR Kyla Balzarine  RECOMMENDATIONS  WILL  FORWARD  TO  SHERRI  AND  DR [name]  TO  REVIEW  AND  TO  SCHEDULE   AN  APPT .Adonis Housekeeper

## 2013-03-19 ENCOUNTER — Other Ambulatory Visit: Payer: Self-pay

## 2013-03-19 ENCOUNTER — Encounter: Payer: Self-pay | Admitting: Internal Medicine

## 2013-04-17 ENCOUNTER — Emergency Department (HOSPITAL_BASED_OUTPATIENT_CLINIC_OR_DEPARTMENT_OTHER): Payer: Managed Care, Other (non HMO)

## 2013-04-17 ENCOUNTER — Encounter (HOSPITAL_BASED_OUTPATIENT_CLINIC_OR_DEPARTMENT_OTHER): Payer: Self-pay | Admitting: Emergency Medicine

## 2013-04-17 ENCOUNTER — Emergency Department (HOSPITAL_BASED_OUTPATIENT_CLINIC_OR_DEPARTMENT_OTHER)
Admission: EM | Admit: 2013-04-17 | Discharge: 2013-04-17 | Disposition: A | Discharge status: Home / Self Care | Payer: Managed Care, Other (non HMO) | Attending: Emergency Medicine | Admitting: Emergency Medicine

## 2013-04-17 DIAGNOSIS — J189 Pneumonia, unspecified organism: Secondary | ICD-10-CM

## 2013-04-17 DIAGNOSIS — Z992 Dependence on renal dialysis: Secondary | ICD-10-CM | POA: Insufficient documentation

## 2013-04-17 DIAGNOSIS — M87059 Idiopathic aseptic necrosis of unspecified femur: Secondary | ICD-10-CM | POA: Insufficient documentation

## 2013-04-17 DIAGNOSIS — Z79899 Other long term (current) drug therapy: Secondary | ICD-10-CM | POA: Insufficient documentation

## 2013-04-17 DIAGNOSIS — Z7902 Long term (current) use of antithrombotics/antiplatelets: Secondary | ICD-10-CM | POA: Insufficient documentation

## 2013-04-17 DIAGNOSIS — Z86718 Personal history of other venous thrombosis and embolism: Secondary | ICD-10-CM | POA: Insufficient documentation

## 2013-04-17 DIAGNOSIS — Z87891 Personal history of nicotine dependence: Secondary | ICD-10-CM | POA: Insufficient documentation

## 2013-04-17 DIAGNOSIS — Z940 Kidney transplant status: Secondary | ICD-10-CM | POA: Insufficient documentation

## 2013-04-17 DIAGNOSIS — N186 End stage renal disease: Secondary | ICD-10-CM | POA: Insufficient documentation

## 2013-04-17 DIAGNOSIS — Z95810 Presence of automatic (implantable) cardiac defibrillator: Secondary | ICD-10-CM | POA: Insufficient documentation

## 2013-04-17 DIAGNOSIS — Z862 Personal history of diseases of the blood and blood-forming organs and certain disorders involving the immune mechanism: Secondary | ICD-10-CM | POA: Insufficient documentation

## 2013-04-17 DIAGNOSIS — J159 Unspecified bacterial pneumonia: Secondary | ICD-10-CM | POA: Insufficient documentation

## 2013-04-17 DIAGNOSIS — Z7982 Long term (current) use of aspirin: Secondary | ICD-10-CM | POA: Insufficient documentation

## 2013-04-17 DIAGNOSIS — Z9861 Coronary angioplasty status: Secondary | ICD-10-CM | POA: Insufficient documentation

## 2013-04-17 DIAGNOSIS — I252 Old myocardial infarction: Secondary | ICD-10-CM | POA: Insufficient documentation

## 2013-04-17 DIAGNOSIS — K219 Gastro-esophageal reflux disease without esophagitis: Secondary | ICD-10-CM | POA: Insufficient documentation

## 2013-04-17 DIAGNOSIS — M109 Gout, unspecified: Secondary | ICD-10-CM | POA: Insufficient documentation

## 2013-04-17 DIAGNOSIS — I120 Hypertensive chronic kidney disease with stage 5 chronic kidney disease or end stage renal disease: Secondary | ICD-10-CM | POA: Insufficient documentation

## 2013-04-17 DIAGNOSIS — I2510 Atherosclerotic heart disease of native coronary artery without angina pectoris: Secondary | ICD-10-CM | POA: Insufficient documentation

## 2013-04-17 DIAGNOSIS — IMO0002 Reserved for concepts with insufficient information to code with codable children: Secondary | ICD-10-CM | POA: Insufficient documentation

## 2013-04-17 LAB — BASIC METABOLIC PANEL
BUN: 16 mg/dL (ref 6–23)
CO2: 24 mEq/L (ref 19–32)
Chloride: 103 mEq/L (ref 96–112)
Creatinine, Ser: 1.5 mg/dL — ABNORMAL HIGH (ref 0.50–1.35)
GFR calc non Af Amer: 54 mL/min — ABNORMAL LOW (ref >90)
Glucose, Bld: 118 mg/dL — ABNORMAL HIGH (ref 70–99)

## 2013-04-17 LAB — APTT: aPTT: 29 seconds (ref 24–37)

## 2013-04-17 LAB — CBC WITH DIFFERENTIAL/PLATELET
HCT: 48.7 % (ref 39.0–52.0)
Hemoglobin: 16 g/dL (ref 13.0–17.0)
Lymphocytes Relative: 11 % — ABNORMAL LOW (ref 12–46)
MCHC: 32.9 g/dL (ref 30.0–36.0)
MCV: 92.8 fL (ref 78.0–100.0)
Monocytes Absolute: 1.1 10*3/uL — ABNORMAL HIGH (ref 0.1–1.0)
Monocytes Relative: 11 % (ref 3–12)
Neutro Abs: 7.8 10*3/uL — ABNORMAL HIGH (ref 1.7–7.7)
RBC: 5.25 MIL/uL (ref 4.22–5.81)
WBC: 10.4 10*3/uL (ref 4.0–10.5)

## 2013-04-17 MED ORDER — MOXIFLOXACIN HCL 400 MG PO TABS: 400.0000 mg | ORAL_TABLET | Freq: Every day | ORAL | Status: DC

## 2013-04-17 NOTE — ED Provider Notes (Signed)
CSN: [id number]     Arrival date & time 04/17/13  0441 History   First MD Initiated Contact with Patient 04/17/13 0445     Chief Complaint  Patient presents with  . Pleurisy   (Consider location/radiation/quality/duration/timing/severity/associated sxs/prior Treatment) HPI PT with numerous medical problems, but most recently a kidney transplant at [facility] about 6 months ago. Had been doing well until the last few days when he noticed increased GERD, feels like he is choking on his stomach acid at night. For the last day he has also had a sharp, moderate pain in L upper chest/clavicle area, worse with lying flat and improved with sitting up. Mild associated SOB, no cough or fever. No leg swelling or weight gain. Still making urine. Seen at [facility] in followup earlier this week and had labs he reported were fine.   Past Medical History  Diagnosis Date  . Lupus   . CAD (coronary artery disease)      stents RCA/Circ 2001, BMS to LAD 07/2010  . CRF (chronic renal failure)     HD T/Th/Sat (Dr. Levester Fresh transplant 11/10/12 ([facility]--Dr. Grandville Silos is transplant surgeon)  . Cardiomyopathy     Ischemic:  02/2011 EF 30-35%.   Single chamber ICD 11/20/10 ([name])  . Gout     s/p renal transplant he was weened off of his allopurinol.  . 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 Left ventricular thrombus 2012    Re-eval 02/2011 showed thrombus RESOLVED, so coumadin was d/c'd (was on it for 61mo)  . implantable cardiac defibrillator single chamber     Medtronic  . Hypertension   . Anemia   . Heart attack 2001 and 2012    3 stents and defib placed in July  . GERD (gastroesophageal reflux disease)   . Hiatal hernia    Past Surgical History  Procedure Laterality Date  . Stents      05-2010 and 2- in 2010  . Partial hip arthroplasty      left  . Renal biopsy    . Tympanostomy tube placement  47 yrs old  . Joint replacement    . Av fistula  placement  03/28/2011    Procedure: ARTERIOVENOUS (AV) FISTULA CREATION;  Surgeon: Rosetta Posner, MD;  Location: [place];  Service: Vascular;  Laterality: Left;  Creation of Left radiocephallic cimino fistula  . Cardiac catheterization      08/2010  . US echocardiography  12/2010  . Cardiovascular stress test  07/2010  . Av fistula placement  04/27/2011    Procedure: ARTERIOVENOUS (AV) FISTULA CREATION;  Surgeon: Rosetta Posner, MD;  Location: [place];  Service: Vascular;  Laterality: Left;  left basilic vein transposition  . Insert / replace / remove pacemaker      medtronic        dr Juleen China    [place]   icd only  . Total hip arthroplasty  07/09/2011    Procedure: TOTAL HIP ARTHROPLASTY;  Surgeon: Kerin Salen, MD;  Location: [place];  Service: Orthopedics;  Laterality: Right;  . Kidney transplant  11/2012    [facility]--Dr. Blair Heys   Family History  Problem Relation Age of Onset  . Kidney failure Mother   . Lupus Mother   . Stroke Mother   . Diabetes Mother     type 2  . Heart attack Father     X 7  . Hypertension Father   .  Hyperlipidemia Father   . Heart attack Sister 65    X 1  . Hyperlipidemia Sister   . Hypertension Sister   . Heart disease Sister   . Heart attack Paternal Grandfather   . Heart disease Paternal Aunt   . Heart disease Paternal Uncle    History  Substance Use Topics  . Smoking status: Former Smoker -- 0.50 packs/day for 30 years    Types: Cigarettes    Quit date: 08/02/2010  . Smokeless tobacco: Never Used  . Alcohol Use: No    Review of Systems All other systems reviewed and are negative except as noted in HPI.   Allergies  Review of patient's allergies indicates no known allergies.  Home Medications   Current Outpatient Rx  Name  Route  Sig  Dispense  Refill  . allopurinol (ZYLOPRIM) 300 MG tablet      TAKE 1 TABLET (300 MG TOTAL) BY MOUTH DAILY.   30 tablet   5   . aspirin 81 MG tablet   Oral   Take 1 tablet (81 mg total) by mouth daily.    30 tablet   2   . atorvastatin (LIPITOR) 80 MG tablet   Oral   Take 1 tablet (80 mg total) by mouth daily.   30 tablet   2   . Cholecalciferol (VITAMIN D3) 5000 UNITS TABS   Oral   Take by mouth.         . clopidogrel (PLAVIX) 75 MG tablet   Oral   Take 1 tablet (75 mg total) by mouth daily.   30 tablet   5   . esomeprazole (NEXIUM) 40 MG capsule   Oral   Take 1 capsule (40 mg total) by mouth daily before breakfast.   30 capsule   2   . metoprolol tartrate (LOPRESSOR) 25 MG tablet   Oral   Take 1 tablet (25 mg total) by mouth as directed.   180 tablet   3   . mycophenolate (CELLCEPT) 500 MG tablet   Oral   Take by mouth as directed. 1 1/2 tab bid         . niacin (NIASPAN) 500 MG CR tablet   Oral   Take 2 tablets (1,000 mg total) by mouth at bedtime.   30 tablet   2   . NITROSTAT 0.4 MG SL tablet      USE 1 TAB UNDER TONGUE EVERY 5 MINUTES UP TO 3 DOSES AS NEEDED   25 tablet   2   . predniSONE (DELTASONE) 5 MG tablet   Oral   Take 5 mg by mouth daily.         [name] sulfamethoxazole-trimethoprim (BACTRIM DS) 800-160 MG per tablet   Oral   Take 1 tablet by mouth 3 (three) times a week.         . tacrolimus (PROGRAF) 1 MG capsule   Oral   Take 1 mg by mouth daily. 1/2 mg in morning, 1 mg at night; Changed 02/23/13         . valsartan (DIOVAN) 40 MG tablet   Oral   Take 40 mg by mouth daily.         . niacin 500 MG tablet      Take two tabs at bedtime.   60 tablet   3    BP 139/90  Pulse 96  Temp(Src) 97.7 F (36.5 C) (Oral)  Resp 18  Ht 5\' 7"  (1.702 m)  Wt 191  lb (86.637 kg)  BMI 29.91 kg/m2  SpO2 99% Physical Exam  Nursing note and vitals reviewed. Constitutional: He is oriented to person, place, and time. He appears well-developed and well-nourished.  HENT:  Head: Normocephalic and atraumatic.  Eyes: EOM are normal. Pupils are equal, round, and reactive to light.  Neck: Normal range of motion. Neck supple.  Cardiovascular:  Normal rate, normal heart sounds and intact distal pulses.   Pulmonary/Chest: Effort normal and breath sounds normal. He has no wheezes. He has no rales.  Abdominal: Bowel sounds are normal. He exhibits no distension. There is no tenderness.  Musculoskeletal: Normal range of motion. He exhibits no edema and no tenderness.  Palpable thrill in LUE dialysis access  Neurological: He is alert and oriented to person, place, and time. He has normal strength. No cranial nerve deficit or sensory deficit.  Skin: Skin is warm and dry. No rash noted.  Psychiatric: He has a normal mood and affect.    ED Course  Procedures (including critical care time) Labs Review Labs Reviewed  CBC WITH DIFFERENTIAL - Abnormal; Notable for the following:    Neutro Abs 7.8 (*)    Lymphocytes Relative 11 (*)    Monocytes Absolute 1.1 (*)    All other components within normal limits  BASIC METABOLIC PANEL - Abnormal; Notable for the following:    Glucose, Bld 118 (*)    Creatinine, Ser 1.50 (*)    GFR calc non Af Amer 54 (*)    GFR calc Af Amer 63 (*)    All other components within normal limits  TROPONIN I  PROTIME-INR  APTT   Imaging Review Dg Chest 2 View  04/17/2013   CLINICAL DATA:  Left upper chest pain, shortness of breath  EXAM: CHEST  2 VIEW  COMPARISON:  Prior radiograph from 09/14/2011  FINDINGS: Right-sided single lead transvenous pacemaker with electrode overlying the right ventricle is stable in position. Cardiomegaly is unchanged.  Lungs are mildly hypoinflated. Focal infiltrate is present within the left perihilar region, worrisome for possible pneumonia. Additional left basilar opacity may reflect atelectasis or infiltrate. The right lung is clear. No pulmonary edema or pleural effusion. No pneumothorax.  No acute osseous abnormality identified.  IMPRESSION: 1. Left perihilar infiltrate, worrisome for pneumonia. 2. Patchy and linear opacity within the peripheral left lung base, which may reflect  atelectasis or additional infiltrate. 3. Stable cardiomegaly.  No pulmonary edema.   Electronically Signed   By: Jeannine Boga M.D.   On: 04/17/2013 05:33    EKG Interpretation    Date/Time:  Friday April 17 2013 05:16:39 EST Ventricular Rate:  95 PR Interval:  192 QRS Duration: 96 QT Interval:  370 QTC Calculation: 464 R Axis:   7 Text Interpretation:  Sinus rhythm with Premature supraventricular complexes and with occasional Premature ventricular complexes Biatrial enlargement Inferior infarct , age undetermined Anterolateral infarct , age undetermined Abnormal ECG No significant change since last tracing Confirmed by SHELDON  MD, CHARLES [phone]) on 04/17/2013 5:18:41 AM            MDM   1. CAP (community acquired pneumonia)     Labs reviewed and unremarkable, no neutropenia. CXR images reviewed. Pt is non-toxic, afebrile and normotensive. No concern for sepsis. His renal function is doing well considering his history but will avoid potentially nephrotoxic antibiotics. Will treat with Avelox for CAP. Advised close PCP followup for recheck, already scheduled in 1 week. Given strict return precautions for worsening pain, fever, SOB or  for any other concerns.    Charles B. Karle Starch, MD 04/17/13 [phone]

## 2013-04-17 NOTE — ED Notes (Signed)
Patient transported to X-ray 

## 2013-04-17 NOTE — ED Notes (Signed)
MD at bedside. 

## 2013-04-17 NOTE — ED Notes (Signed)
Pt c/o left sided upper chest and shoulder pain, denies injury. Pain worsens with deep breath and cough. Denies fever.

## 2013-04-23 ENCOUNTER — Encounter: Payer: Self-pay | Admitting: Family Medicine

## 2013-04-23 ENCOUNTER — Ambulatory Visit (INDEPENDENT_AMBULATORY_CARE_PROVIDER_SITE_OTHER): Payer: Managed Care, Other (non HMO) | Admitting: Family Medicine

## 2013-04-23 VITALS — BP 117/84 | HR 89 | Temp 97.4°F | Resp 18 | Ht 67.0 in | Wt 197.0 lb

## 2013-04-23 DIAGNOSIS — K2100 Gastro-esophageal reflux disease with esophagitis, without bleeding: Secondary | ICD-10-CM

## 2013-04-23 DIAGNOSIS — J690 Pneumonitis due to inhalation of food and vomit: Secondary | ICD-10-CM

## 2013-04-23 DIAGNOSIS — J189 Pneumonia, unspecified organism: Secondary | ICD-10-CM | POA: Insufficient documentation

## 2013-04-23 MED ORDER — MOXIFLOXACIN HCL 400 MG PO TABS: 400.0000 mg | ORAL_TABLET | Freq: Every day | ORAL | Status: DC

## 2013-04-23 MED ORDER — ESOMEPRAZOLE MAGNESIUM 40 MG PO CPDR: 40.0000 mg | DELAYED_RELEASE_CAPSULE | Freq: Every day | ORAL | Status: DC

## 2013-04-23 NOTE — Progress Notes (Addendum)
OFFICE NOTE Pre visit review using our clinic review tool, if applicable. No additional management support is needed unless otherwise documented below in the visit note.   04/23/2013  CC:  Chief Complaint  Patient presents with  . Follow-up  . Pneumonia     HPI: Patient is a 46 y.o. Caucasian male who is here for 6 mo f/u Lupus, hx of relatively recent renal transplant, CAD, HTN. Dx'd with left lung pneumonia 6d/a.  Wheezing initially but this has resolved, still has a pain in left upper chest region--sometimes deep ache and sometimes it is not present.  NOT related to exertion. Still has some mild DOE.  He recalls several nights prior to his dx that he woke up choking on GER ("about 4-5 times in the last month".  He already takes strict reflux precautions and takes nexium 40mg  qd..   Pertinent PMH:  Past Medical History  Diagnosis Date  . Lupus   . CAD (coronary artery disease)      stents RCA/Circ 2001, BMS to LAD 07/2010  . CRF (chronic renal failure)     Secondary to SLE--renal transplant 11/2012  . Cardiomyopathy     Ischemic:  02/2011 EF 30-35%.   Single chamber ICD 11/20/10 ([name])  . Gout     s/p renal transplant he was weened off of his allopurinol.  . 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 Left ventricular thrombus 2012    Re-eval 02/2011 showed thrombus RESOLVED, so coumadin was d/c'd (was on it for 18mo)  . implantable cardiac defibrillator single chamber     Medtronic  . Hypertension   . Anemia   . Heart attack 2001 and 2012    3 stents and defib placed in July  . GERD (gastroesophageal reflux disease)   . Hiatal hernia    Past Surgical History  Procedure Laterality Date  . Stents      05-2010 and 2- in 2010  . Partial hip arthroplasty      left  . Renal biopsy    . Tympanostomy tube placement  47 yrs old  . Joint replacement    . Av fistula placement  03/28/2011    Procedure: ARTERIOVENOUS (AV)  FISTULA CREATION;  Surgeon: Rosetta Posner, MD;  Location: [place];  Service: Vascular;  Laterality: Left;  Creation of Left radiocephallic cimino fistula  . Cardiac catheterization      08/2010  . US echocardiography  12/2010  . Cardiovascular stress test  07/2010  . Av fistula placement  04/27/2011    Procedure: ARTERIOVENOUS (AV) FISTULA CREATION;  Surgeon: Rosetta Posner, MD;  Location: [place];  Service: Vascular;  Laterality: Left;  left basilic vein transposition  . Insert / replace / remove pacemaker      medtronic        dr Juleen China    [place]   icd only  . Total hip arthroplasty  07/09/2011    Procedure: TOTAL HIP ARTHROPLASTY;  Surgeon: Kerin Salen, MD;  Location: [place];  Service: Orthopedics;  Laterality: Right;  . Kidney transplant  11/2012    [facility]--Dr. Blair Heys    MEDS:  Outpatient Prescriptions Prior to Visit  Medication Sig Dispense Refill  . allopurinol (ZYLOPRIM) 300 MG tablet TAKE 1 TABLET (300 MG TOTAL) BY MOUTH DAILY.  30 tablet  5  . aspirin 81 MG tablet Take 1 tablet (81 mg total) by mouth daily.  30 tablet  2  . atorvastatin (LIPITOR) 80 MG tablet Take 1 tablet (80 mg total) by mouth daily.  30 tablet  2  . Cholecalciferol (VITAMIN D3) 5000 UNITS TABS Take by mouth.      . clopidogrel (PLAVIX) 75 MG tablet Take 1 tablet (75 mg total) by mouth daily.  30 tablet  5  . esomeprazole (NEXIUM) 40 MG capsule Take 1 capsule (40 mg total) by mouth daily before breakfast.  30 capsule  2  . metoprolol tartrate (LOPRESSOR) 25 MG tablet Take 1 tablet (25 mg total) by mouth as directed.  180 tablet  3  . moxifloxacin (AVELOX) 400 MG tablet Take 1 tablet (400 mg total) by mouth daily at 8 pm.  7 tablet  0  . mycophenolate (CELLCEPT) 500 MG tablet Take by mouth as directed. 1 1/2 tab bid      . niacin (NIASPAN) 500 MG CR tablet Take 2 tablets (1,000 mg total) by mouth at bedtime.  30 tablet  2  . NITROSTAT 0.4 MG SL tablet USE 1 TAB UNDER TONGUE EVERY 5 MINUTES UP TO 3 DOSES AS NEEDED   25 tablet  2  . predniSONE (DELTASONE) 5 MG tablet Take 5 mg by mouth daily.      [name] sulfamethoxazole-trimethoprim (BACTRIM DS) 800-160 MG per tablet Take 1 tablet by mouth 3 (three) times a week.      . tacrolimus (PROGRAF) 1 MG capsule Take 1 mg by mouth daily. 1/2 mg in morning, 1 mg at night; Changed 02/23/13      . valsartan (DIOVAN) 40 MG tablet Take 40 mg by mouth daily.      . niacin 500 MG tablet Take two tabs at bedtime.  60 tablet  3   No facility-administered medications prior to visit.    PE: Blood pressure 117/84, pulse 89, temperature 97.4 F (36.3 C), temperature source Temporal, resp. rate 18, height 5\' 7"  (1.702 m), weight 197 lb (89.359 kg), SpO2 97.00%. Gen: Alert, well appearing.  Patient is oriented to person, place, time, and situation. Oropharynx mildly erythematous, with bumpy/cobblestoned appearance. CV: RRR, no murmur LUNGS: CTA bilat, with question of mild left basilar diminished breath sounds and left mid-lung bronchial breath sounds.  Nonlabored reps. Chest wall nontender to palpation.   IMPRESSION AND PLAN:  1) Pneumonia, likely aspiration.  He is mildly improved with 1 week of avelox. Will continue with 7d more days of this antibiotic.  2) GERD, severe.  I think his chest pains are from reflux esophagitis. Increased nexium to 40 mg bid.  He is aware of potential interaction between plavix and nexium (all PPI's, technically).    Reviewed labs from 04/17/13 that he had done at his nephrologist's office: Cr 1.3.  FOLLOW UP: 10d

## 2013-05-04 ENCOUNTER — Ambulatory Visit (INDEPENDENT_AMBULATORY_CARE_PROVIDER_SITE_OTHER): Payer: Managed Care, Other (non HMO) | Admitting: Family Medicine

## 2013-05-04 ENCOUNTER — Encounter: Payer: Self-pay | Admitting: Family Medicine

## 2013-05-04 ENCOUNTER — Ambulatory Visit (INDEPENDENT_AMBULATORY_CARE_PROVIDER_SITE_OTHER): Payer: Managed Care, Other (non HMO) | Admitting: *Deleted

## 2013-05-04 VITALS — BP 119/81 | HR 84 | Temp 97.5°F | Ht 67.0 in | Wt 191.0 lb

## 2013-05-04 DIAGNOSIS — J690 Pneumonitis due to inhalation of food and vomit: Secondary | ICD-10-CM

## 2013-05-04 DIAGNOSIS — I255 Ischemic cardiomyopathy: Secondary | ICD-10-CM

## 2013-05-04 DIAGNOSIS — K2100 Gastro-esophageal reflux disease with esophagitis, without bleeding: Secondary | ICD-10-CM

## 2013-05-04 DIAGNOSIS — I2589 Other forms of chronic ischemic heart disease: Secondary | ICD-10-CM

## 2013-05-04 LAB — MDC_IDC_ENUM_SESS_TYPE_REMOTE
Date Time Interrogation Session: 20141222021848
HIGH POWER IMPEDANCE MEASURED VALUE: 68 Ohm
HighPow Impedance: 190 Ohm
HighPow Impedance: 285 Ohm
HighPow Impedance: 51 Ohm
Lead Channel Impedance Value: 399 Ohm
Lead Channel Pacing Threshold Pulse Width: 0.4 ms
Lead Channel Setting Pacing Amplitude: 2.5 V
Lead Channel Setting Sensing Sensitivity: 0.3 mV
MDC IDC MSMT BATTERY VOLTAGE: 3.1 V
MDC IDC MSMT LEADCHNL RV PACING THRESHOLD AMPLITUDE: 1.25 V
MDC IDC MSMT LEADCHNL RV SENSING INTR AMPL: 13.875 mV
MDC IDC SET LEADCHNL RV PACING PULSEWIDTH: 0.4 ms
MDC IDC SET ZONE DETECTION INTERVAL: 300 ms
MDC IDC STAT BRADY RV PERCENT PACED: 0.01 %
Zone Setting Detection Interval: 350 ms
Zone Setting Detection Interval: 360 ms

## 2013-05-04 NOTE — Progress Notes (Signed)
OFFICE NOTE  05/04/2013  CC:  Chief Complaint  Patient presents with  . Follow-up     HPI: Patient is a 47 y.o. Caucasian male who is here for 10 day f/u for aspiration pneumonia and GERD/esophagitis.  Feeling much better--minimal yellowish phlegm in mornings.  No fevers or SOB. Chest pain gone and GER MUCH improved on nexium bid for the last 10d. Next f/u with [facility] transpant is 05/12/13. He feels like the increased reflux is secondary to his immunosuppression meds.  Pertinent PMH:  Past Medical History  Diagnosis Date  . Lupus   . CAD (coronary artery disease)      stents RCA/Circ 2001, BMS to LAD 07/2010  . CRF (chronic renal failure)     Secondary to SLE--renal transplant 11/2012  . Cardiomyopathy     Ischemic:  02/2011 EF 30-35%.   Single chamber ICD 11/20/10 ([name])  . Gout     s/p renal transplant he was weened off of his allopurinol.  . 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 Left ventricular thrombus 2012    Re-eval 02/2011 showed thrombus RESOLVED, so coumadin was d/c'd (was on it for 107mo)  . implantable cardiac defibrillator single chamber     Medtronic  . Hypertension   . Anemia   . Heart attack 2001 and 2012    3 stents and defib placed in July  . GERD (gastroesophageal reflux disease)   . Hiatal hernia    Past surgical, social, and family history reviewed and no changes noted since last office visit.  MEDS:  Outpatient Prescriptions Prior to Visit  Medication Sig Dispense Refill  . allopurinol (ZYLOPRIM) 300 MG tablet TAKE 1 TABLET (300 MG TOTAL) BY MOUTH DAILY.  30 tablet  5  . aspirin 81 MG tablet Take 1 tablet (81 mg total) by mouth daily.  30 tablet  2  . atorvastatin (LIPITOR) 80 MG tablet Take 1 tablet (80 mg total) by mouth daily.  30 tablet  2  . Cholecalciferol (VITAMIN D3) 5000 UNITS TABS Take by mouth.      . clopidogrel (PLAVIX) 75 MG tablet Take 1 tablet (75 mg total) by mouth daily.  30  tablet  5  . esomeprazole (NEXIUM) 40 MG capsule Take 1 capsule (40 mg total) by mouth daily before breakfast. 1 tab po bid  60 capsule  3  . metoprolol tartrate (LOPRESSOR) 25 MG tablet Take 1 tablet (25 mg total) by mouth as directed.  180 tablet  3  . moxifloxacin (AVELOX) 400 MG tablet Take 1 tablet (400 mg total) by mouth daily at 8 pm.  7 tablet  0  . mycophenolate (CELLCEPT) 500 MG tablet Take by mouth as directed. 1 1/2 tab bid      . niacin (NIASPAN) 500 MG CR tablet Take 2 tablets (1,000 mg total) by mouth at bedtime.  30 tablet  2  . NITROSTAT 0.4 MG SL tablet USE 1 TAB UNDER TONGUE EVERY 5 MINUTES UP TO 3 DOSES AS NEEDED  25 tablet  2  . predniSONE (DELTASONE) 5 MG tablet Take 5 mg by mouth daily.      [name] sulfamethoxazole-trimethoprim (BACTRIM DS) 800-160 MG per tablet Take 1 tablet by mouth 3 (three) times a week.      . tacrolimus (PROGRAF) 1 MG capsule Take 1 mg by mouth daily. 1/2 mg in morning, 1 mg at night; Changed 02/23/13      .  valsartan (DIOVAN) 40 MG tablet Take 40 mg by mouth daily.      [name] zolpidem (AMBIEN) 5 MG tablet        No facility-administered medications prior to visit.    PE: Blood pressure 119/81, pulse 84, temperature 97.5 F (36.4 C), temperature source Temporal, height 5\' 7"  (1.702 m), weight 191 lb (86.637 kg), SpO2 97.00%. Gen: Alert, well appearing.  Patient is oriented to person, place, time, and situation. No further exam today.  IMPRESSION AND PLAN:  1) Aspiration pneumonia--resolved.  2) GERD/Esophagitis--much improved on bid nexium for the last 10d. Plan to complete 1 mo of bid nexium then resume qd dosing and see how he does.  [facility] has recommended he not get flu vaccine yet--he'll f/u with them soon and perhaps get this vaccine with them at that time.  FOLLOW UP: 62mo

## 2013-05-28 ENCOUNTER — Encounter: Payer: Self-pay | Admitting: *Deleted

## 2013-06-04 ENCOUNTER — Encounter: Payer: Self-pay | Admitting: Internal Medicine

## 2013-06-05 ENCOUNTER — Other Ambulatory Visit: Payer: Self-pay | Admitting: Cardiovascular Disease

## 2013-07-30 ENCOUNTER — Other Ambulatory Visit: Payer: Self-pay

## 2013-07-30 MED ORDER — ATORVASTATIN CALCIUM 80 MG PO TABS: 80.0000 mg | ORAL_TABLET | Freq: Every day | ORAL | Status: DC

## 2013-07-31 ENCOUNTER — Other Ambulatory Visit: Payer: Self-pay | Admitting: *Deleted

## 2013-07-31 MED ORDER — ATORVASTATIN CALCIUM 80 MG PO TABS: 80.0000 mg | ORAL_TABLET | Freq: Every day | ORAL | Status: DC

## 2013-08-05 ENCOUNTER — Ambulatory Visit (INDEPENDENT_AMBULATORY_CARE_PROVIDER_SITE_OTHER): Payer: Commercial Indemnity | Admitting: *Deleted

## 2013-08-05 DIAGNOSIS — I2589 Other forms of chronic ischemic heart disease: Secondary | ICD-10-CM

## 2013-08-05 DIAGNOSIS — I255 Ischemic cardiomyopathy: Secondary | ICD-10-CM

## 2013-08-05 LAB — MDC_IDC_ENUM_SESS_TYPE_REMOTE
HIGH POWER IMPEDANCE MEASURED VALUE: 63 Ohm
HIGH POWER IMPEDANCE MEASURED VALUE: 83 Ohm
Lead Channel Pacing Threshold Amplitude: 1 V
Lead Channel Pacing Threshold Pulse Width: 0.4 ms
Lead Channel Sensing Intrinsic Amplitude: 15.3 mV
Lead Channel Setting Pacing Pulse Width: 0.4 ms
Lead Channel Setting Sensing Sensitivity: 0.3 mV
MDC IDC MSMT BATTERY VOLTAGE: 3.13 V
MDC IDC MSMT LEADCHNL RV IMPEDANCE VALUE: 418 Ohm
MDC IDC SET LEADCHNL RV PACING AMPLITUDE: 2.5 V
MDC IDC SET ZONE DETECTION INTERVAL: 350 ms
MDC IDC STAT BRADY RV PERCENT PACED: 0.1 % — AB
Zone Setting Detection Interval: 300 ms
Zone Setting Detection Interval: 360 ms

## 2013-08-10 ENCOUNTER — Encounter: Payer: Self-pay | Admitting: Cardiovascular Disease

## 2013-08-10 ENCOUNTER — Ambulatory Visit (INDEPENDENT_AMBULATORY_CARE_PROVIDER_SITE_OTHER): Payer: Commercial Indemnity | Admitting: Cardiovascular Disease

## 2013-08-10 VITALS — BP 134/80 | HR 86 | Ht 67.0 in | Wt 199.0 lb

## 2013-08-10 DIAGNOSIS — I2510 Atherosclerotic heart disease of native coronary artery without angina pectoris: Secondary | ICD-10-CM

## 2013-08-10 DIAGNOSIS — I255 Ischemic cardiomyopathy: Secondary | ICD-10-CM

## 2013-08-10 DIAGNOSIS — N186 End stage renal disease: Secondary | ICD-10-CM

## 2013-08-10 DIAGNOSIS — E782 Mixed hyperlipidemia: Secondary | ICD-10-CM

## 2013-08-10 DIAGNOSIS — Z95810 Presence of automatic (implantable) cardiac defibrillator: Secondary | ICD-10-CM

## 2013-08-10 DIAGNOSIS — I2589 Other forms of chronic ischemic heart disease: Secondary | ICD-10-CM

## 2013-08-10 NOTE — Assessment & Plan Note (Signed)
No d/c Single chamber device Anterior MI EF 30-35%  F/U EP

## 2013-08-10 NOTE — Patient Instructions (Signed)
Your physician wants you to follow-up in: 1 year  You will receive a reminder letter in the mail two months in advance. If you don't receive a letter, please call our office to schedule the follow-up appointment.  Your physician recommends that you continue on your current medications as directed. Please refer to the Current Medication list given to you today.  

## 2013-08-10 NOTE — Assessment & Plan Note (Signed)
Stable with no angina and good activity level.  Continue medical Rx  

## 2013-08-10 NOTE — Assessment & Plan Note (Signed)
S/P transplant [facility] 6/14  Sees them every 3 months  Continue 3 drug Rx for rejection

## 2013-08-10 NOTE — Assessment & Plan Note (Signed)
Euvolemic no limitations to activity  Continue current meds

## 2013-08-10 NOTE — Assessment & Plan Note (Signed)
Cholesterol is at goal.  Continue current dose of statin and diet Rx.  No myalgias or side effects.  F/U  LFT's in 6 months. Lab Results  Component Value Date   LDLCALC 38 06/27/2011

## 2013-08-10 NOTE — Progress Notes (Signed)
Patient ID: Roy Dyer, male   DOB: Sep 12, 1965, 48 y.o.   MRN: [id number] Roy Dyer is a 48 year old male with a history of coronary artery disease who suffered anterior MI in March of 2012. He underwent bare metal stenting to his LAD at that time. Had previous stents to circ and RCA in 2001 At discharge, he was placed on a LifeVest because of depressed LV function. The patient was maintained on optimal medical therapy. His ejection fraction was reassessed 90 days post revascularization and was found to still be less than 35%. Had BiV AICD placed by Dr Roy Dyer 11/20/10. DFT;s not tested due to history of large mural apical thrombus. Repeat MRI done 11/17/10 showed resolution of thrombus and EF 28% Coumadin stopped  Had hip replaced in 3/12 and had worse renal failure and is now on dialysis. Makes very little urine and weight continues to drop Lost over 70lbs over the last year Recent remote AICD check last week was fine  Nausea resolved off amiodarone.  BP quite low on dialysis and has to hold ACE and frequently coreg  Being evaluated at [facility] for renal transplant  Echo 3/13  Study Conclusions  - Left ventricle: The cavity size was moderately dilated. Wall thickness was increased in a pattern of mild LVH. The estimated ejection fraction was 35%. Akinesis of the mid-distalanteroseptal and apical myocardium; consistent with infarction. No evidence of thrombus. Acoustic contrast opacification revealed no evidence ofthrombus. - Mitral valve: Mild regurgitation. - Left atrium: The atrium was moderately dilated. - Right atrium: The atrium was mildly dilated. - Atrial septum: No defect or patent foramen ovale was identified. - Pulmonary arteries: PA peak pressure: 15mm Hg (S).  Recent stress echo with septal and apical infarct ? Inferior ischemia. No chest pain.  Will need cath before any renal transplant. Taking BP before coreg if greater than 100  Had transplant at [facility] in June with niece as donor. Doing  well Did not have cath before procedure    No complaints  Still with fistula in LUE  On 3 drugs for rejection No chest pain dyspnea or palpitations    ROS: Denies fever, malais, weight loss, blurry vision, decreased visual acuity, cough, sputum, SOB, hemoptysis, pleuritic pain, palpitaitons, heartburn, abdominal pain, melena, lower extremity edema, claudication, or rash.  All other systems reviewed and negative  General: Affect appropriate Healthy:  appears stated age 48: normal Neck supple with no adenopathy JVP normal no bruits no thyromegaly Lungs clear with no wheezing and good diaphragmatic motion Heart:  S1/S2 no murmur, no rub, gallop or click PMI normal Abdomen: benighn, BS positve, no tenderness, no AAA Tplant kidney palbable RLQ no bruit.  No HSM or HJR Distal pulses intact with with fistula LUE  No edema Neuro non-focal Skin warm and dry No muscular weakness   Current Outpatient Prescriptions  Medication Sig Dispense Refill  . allopurinol (ZYLOPRIM) 300 MG tablet TAKE 1 TABLET (300 MG TOTAL) BY MOUTH DAILY.  30 tablet  5  . aspirin 81 MG tablet Take 1 tablet (81 mg total) by mouth daily.  30 tablet  2  . atorvastatin (LIPITOR) 80 MG tablet Take 1 tablet (80 mg total) by mouth daily.  30 tablet  0  . Cholecalciferol (VITAMIN D3) 5000 UNITS TABS Take by mouth.      . clopidogrel (PLAVIX) 75 MG tablet TAKE 1 TABLET (75 MG TOTAL) BY MOUTH DAILY.  30 tablet  5  . esomeprazole (NEXIUM) 40 MG capsule Take 40 mg  by mouth 2 (two) times daily before a meal. 1 tab po bid      . metoprolol tartrate (LOPRESSOR) 25 MG tablet Take 1 tablet (25 mg total) by mouth as directed.  180 tablet  3  . moxifloxacin (AVELOX) 400 MG tablet Take 1 tablet (400 mg total) by mouth daily at 8 pm.  7 tablet  0  . Mycophenolate Mofetil (CELLCEPT PO) Take 3 tablets by mouth daily.      . niacin (NIASPAN) 500 MG CR tablet Take 2 tablets (1,000 mg total) by mouth at bedtime.  30 tablet  2  . NITROSTAT  0.4 MG SL tablet USE 1 TAB UNDER TONGUE EVERY 5 MINUTES UP TO 3 DOSES AS NEEDED  25 tablet  2  . predniSONE (DELTASONE) 5 MG tablet Take 5 mg by mouth daily.      [name] sulfamethoxazole-trimethoprim (BACTRIM DS) 800-160 MG per tablet Take 1 tablet by mouth 3 (three) times a week.      . tacrolimus (PROGRAF) 1 MG capsule daily. 1/2 mg in pm, 1 mg morning Changed 02/23/13      . valsartan (DIOVAN) 40 MG tablet Take 40 mg by mouth daily.      [name] zolpidem (AMBIEN) 5 MG tablet at bedtime as needed.       . [DISCONTINUED] calcium carbonate, dosed in mg elemental calcium, 1250 MG/5ML Take 5 mLs (500 mg of elemental calcium total) by mouth every 6 (six) hours as needed.  450 mL  2  . [DISCONTINUED] dexlansoprazole (DEXILANT) 60 MG capsule Take 1 capsule (60 mg total) by mouth daily.  30 capsule  2   No current facility-administered medications for this visit.    Allergies  Review of patient's allergies indicates no known allergies.  Electrocardiogram:  SR low voltage PAC;s   Assessment and Plan

## 2013-09-02 ENCOUNTER — Encounter: Payer: Self-pay | Admitting: Family Medicine

## 2013-09-02 ENCOUNTER — Ambulatory Visit (INDEPENDENT_AMBULATORY_CARE_PROVIDER_SITE_OTHER): Payer: Commercial Indemnity | Admitting: Family Medicine

## 2013-09-02 VITALS — BP 122/79 | HR 73 | Temp 97.8°F | Resp 18 | Ht 67.0 in | Wt 199.0 lb

## 2013-09-02 DIAGNOSIS — I10 Essential (primary) hypertension: Secondary | ICD-10-CM

## 2013-09-02 DIAGNOSIS — I2510 Atherosclerotic heart disease of native coronary artery without angina pectoris: Secondary | ICD-10-CM

## 2013-09-02 DIAGNOSIS — Z1329 Encounter for screening for other suspected endocrine disorder: Secondary | ICD-10-CM

## 2013-09-02 DIAGNOSIS — E785 Hyperlipidemia, unspecified: Secondary | ICD-10-CM

## 2013-09-02 DIAGNOSIS — N186 End stage renal disease: Secondary | ICD-10-CM

## 2013-09-02 DIAGNOSIS — Z940 Kidney transplant status: Secondary | ICD-10-CM | POA: Insufficient documentation

## 2013-09-02 DIAGNOSIS — E782 Mixed hyperlipidemia: Secondary | ICD-10-CM

## 2013-09-02 DIAGNOSIS — M329 Systemic lupus erythematosus, unspecified: Secondary | ICD-10-CM

## 2013-09-02 NOTE — Progress Notes (Signed)
OFFICE NOTE  09/02/2013  CC:  Chief Complaint  Patient presents with  . Follow-up     HPI: Patient is a 48 y.o. Caucasian male who is here for 4 mo f/u HTN, hyperlip, CAD, ESRD s/p renal transplant 11/2012, SLE.    Does some activity like yard work, washing care--couple days a week. Recent lab f/u at [facility] all good per pt. No lipids or TSH done in a couple of years.  Wt is up 8 lbs since last visit and he says he feels like it is calories, not excess fluid---not doing that well with his diet.   Pertinent PMH:  Past medical, surgical, social, and family history reviewed and no changes are noted since last office visit.  MEDS:  Outpatient Prescriptions Prior to Visit  Medication Sig Dispense Refill  . allopurinol (ZYLOPRIM) 300 MG tablet TAKE 1 TABLET (300 MG TOTAL) BY MOUTH DAILY.  30 tablet  5  . aspirin 81 MG tablet Take 1 tablet (81 mg total) by mouth daily.  30 tablet  2  . atorvastatin (LIPITOR) 80 MG tablet Take 1 tablet (80 mg total) by mouth daily.  30 tablet  0  . Cholecalciferol (VITAMIN D3) 5000 UNITS TABS Take by mouth.      . clopidogrel (PLAVIX) 75 MG tablet TAKE 1 TABLET (75 MG TOTAL) BY MOUTH DAILY.  30 tablet  5  . esomeprazole (NEXIUM) 40 MG capsule Take 40 mg by mouth 2 (two) times daily before a meal. 1 tab po bid      . metoprolol tartrate (LOPRESSOR) 25 MG tablet Take 1 tablet (25 mg total) by mouth as directed.  180 tablet  3  . Mycophenolate Mofetil (CELLCEPT PO) Take 3 tablets by mouth daily.      . niacin (NIASPAN) 500 MG CR tablet Take 2 tablets (1,000 mg total) by mouth at bedtime.  30 tablet  2  . NITROSTAT 0.4 MG SL tablet USE 1 TAB UNDER TONGUE EVERY 5 MINUTES UP TO 3 DOSES AS NEEDED  25 tablet  2  . predniSONE (DELTASONE) 5 MG tablet Take 5 mg by mouth daily.      [name] sulfamethoxazole-trimethoprim (BACTRIM DS) 800-160 MG per tablet Take 1 tablet by mouth 3 (three) times a week.      . tacrolimus (PROGRAF) 1 MG capsule daily. 1/2 mg in pm, 1 mg morning  Changed 02/23/13      . valsartan (DIOVAN) 40 MG tablet Take 40 mg by mouth daily.      [name] zolpidem (AMBIEN) 5 MG tablet at bedtime as needed.       . moxifloxacin (AVELOX) 400 MG tablet Take 1 tablet (400 mg total) by mouth daily at 8 pm.  7 tablet  0   No facility-administered medications prior to visit.   PE: Blood pressure 122/79, pulse 73, temperature 97.8 F (36.6 C), temperature source Temporal, resp. rate 18, height 5\' 7"  (1.702 m), weight 199 lb (90.266 kg), SpO2 98.00%. Gen: Alert, well appearing.  Patient is oriented to person, place, time, and situation. [id number]: no injection, icteris, swelling, or exudate.  EOMI, PERRLA. Mouth: lips without lesion/swelling.  Oral mucosa pink and moist. Oropharynx without erythema, exudate, or swelling.  Neck: supple/nontender.  No LAD, mass, or TM.  Carotid pulses 2+ bilaterally, without bruits. CV: RRR, no m/r/g.   LUNGS: CTA bilat, nonlabored resps, good aeration in all lung fields. EXT: no clubbing, cyanosis, or edema.  Left upper arm vascular access with palpable thrill  IMPRESSION  AND PLAN:  ESSENTIAL HYPERTENSION, BENIGN The current medical regimen is effective;  continue present plan and medications. Recent lytes/cr at his [facility] transplant clinic f/u were good.  LUPUS Quiescent at this time.  CAD (coronary artery disease) With hx of MI and with ischemic cardiomyopathy (EF <30%), with ICD. All stable. Continue current medical therapy. FLP at his earliest convenience.  Mixed hyperlipidemia FLP at pt's earliest convenience (not fasting today). Tolerating statin and niaspan.   He will ask his MD's at [facility] transplant clinic at next f/u about prevnar vaccine.  An After Visit Summary was printed and given to the patient.  FOLLOW UP: 78mo o/v, lab visit for fasting chol and TSH screen at earliest convenience

## 2013-09-02 NOTE — Progress Notes (Signed)
Pre visit review using our clinic review tool, if applicable. No additional management support is needed unless otherwise documented below in the visit note. 

## 2013-09-02 NOTE — Assessment & Plan Note (Signed)
FLP at pt's earliest convenience (not fasting today). Tolerating statin and niaspan.

## 2013-09-02 NOTE — Assessment & Plan Note (Signed)
With hx of MI and with ischemic cardiomyopathy (EF <30%), with ICD. All stable. Continue current medical therapy. FLP at his earliest convenience.

## 2013-09-02 NOTE — Assessment & Plan Note (Signed)
Quiescent at this time

## 2013-09-02 NOTE — Assessment & Plan Note (Signed)
The current medical regimen is effective;  continue present plan and medications. Recent lytes/cr at his [facility] transplant clinic f/u were good.

## 2013-09-03 ENCOUNTER — Other Ambulatory Visit (INDEPENDENT_AMBULATORY_CARE_PROVIDER_SITE_OTHER): Payer: Commercial Indemnity

## 2013-09-03 DIAGNOSIS — E785 Hyperlipidemia, unspecified: Secondary | ICD-10-CM

## 2013-09-03 DIAGNOSIS — I10 Essential (primary) hypertension: Secondary | ICD-10-CM

## 2013-09-03 DIAGNOSIS — Z1329 Encounter for screening for other suspected endocrine disorder: Secondary | ICD-10-CM

## 2013-09-03 LAB — LIPID PANEL
Cholesterol: 104 mg/dL (ref 0–200)
HDL: 57.8 mg/dL (ref >39.00)
LDL Cholesterol: 30 mg/dL (ref 0–99)
Total CHOL/HDL Ratio: 2
Triglycerides: 79 mg/dL (ref 0.0–149.0)
VLDL: 15.8 mg/dL (ref 0.0–40.0)

## 2013-09-03 LAB — TSH: TSH: 0.52 u[IU]/mL (ref 0.35–5.50)

## 2013-09-07 ENCOUNTER — Other Ambulatory Visit: Payer: Self-pay

## 2013-09-07 ENCOUNTER — Telehealth: Payer: Self-pay

## 2013-09-07 MED ORDER — ATORVASTATIN CALCIUM 80 MG PO TABS: 80.0000 mg | ORAL_TABLET | Freq: Every day | ORAL | Status: DC

## 2013-09-07 NOTE — Telephone Encounter (Signed)
Stephanie from [facility] called wanting get refills on pt's Rx's. Please call [phone]

## 2013-09-08 ENCOUNTER — Other Ambulatory Visit: Payer: Self-pay

## 2013-09-08 MED ORDER — ESOMEPRAZOLE MAGNESIUM 40 MG PO CPDR: 40.0000 mg | DELAYED_RELEASE_CAPSULE | Freq: Two times a day (BID) | ORAL | Status: DC

## 2013-09-08 MED ORDER — NIACIN ER (ANTIHYPERLIPIDEMIC) 500 MG PO TBCR: 1000.0000 mg | EXTENDED_RELEASE_TABLET | Freq: Every day | ORAL | Status: DC

## 2013-09-08 MED ORDER — VALSARTAN 40 MG PO TABS: 40.0000 mg | ORAL_TABLET | Freq: Every day | ORAL | Status: DC

## 2013-09-08 MED ORDER — ZOLPIDEM TARTRATE 5 MG PO TABS: 5.0000 mg | ORAL_TABLET | Freq: Every evening | ORAL | Status: DC | PRN

## 2013-09-08 MED ORDER — ATORVASTATIN CALCIUM 80 MG PO TABS: 80.0000 mg | ORAL_TABLET | Freq: Every day | ORAL | Status: DC

## 2013-09-08 MED ORDER — METOPROLOL TARTRATE 25 MG PO TABS: 25.0000 mg | ORAL_TABLET | ORAL | Status: DC

## 2013-09-08 MED ORDER — ALLOPURINOL 300 MG PO TABS: ORAL_TABLET | ORAL | Status: DC

## 2013-09-08 MED ORDER — CLOPIDOGREL BISULFATE 75 MG PO TABS: ORAL_TABLET | ORAL | Status: DC

## 2013-09-08 NOTE — Telephone Encounter (Signed)
Ambien 5mg  rx printed.

## 2013-09-08 NOTE — Telephone Encounter (Signed)
Spoke with [employer], refilled meds.

## 2013-09-08 NOTE — Telephone Encounter (Signed)
[employer] called back. Please call them at [phone].

## 2013-09-08 NOTE — Telephone Encounter (Signed)
Called [employer] back to find out what meds pt needs refilled, all lines were busy. Will try again later.

## 2013-09-08 NOTE — Telephone Encounter (Signed)
[facility] delivery requesting Rx for Ambien. Last Ov 09/02/2013. Last refill 04/02/2013

## 2013-09-09 ENCOUNTER — Other Ambulatory Visit: Payer: Self-pay

## 2013-09-09 MED ORDER — ATORVASTATIN CALCIUM 80 MG PO TABS: 80.0000 mg | ORAL_TABLET | Freq: Every day | ORAL | Status: DC

## 2013-09-10 ENCOUNTER — Encounter: Payer: Self-pay | Admitting: *Deleted

## 2013-09-14 ENCOUNTER — Telehealth: Payer: Self-pay | Admitting: Family Medicine

## 2013-09-14 MED ORDER — METOPROLOL TARTRATE 25 MG PO TABS: 25.0000 mg | ORAL_TABLET | Freq: Two times a day (BID) | ORAL | Status: DC

## 2013-09-14 NOTE — Telephone Encounter (Signed)
Pls do rx for lopressor 25mg , 1 tab po bid, #180, 3 additional RF's. -thx

## 2013-09-14 NOTE — Telephone Encounter (Signed)
ERx sent to [facility] home delivery.

## 2013-09-14 NOTE — Telephone Encounter (Signed)
I got a fax from pt's pharmacy stating the quantity / sig don't match on patient's lopressor.   Pt needs 90 day supply but should he be taking one QD or 2QD?  Please advise.

## 2013-09-25 ENCOUNTER — Encounter: Payer: Self-pay | Admitting: Internal Medicine

## 2013-09-28 ENCOUNTER — Other Ambulatory Visit: Payer: Self-pay | Admitting: Family Medicine

## 2013-10-22 ENCOUNTER — Encounter: Payer: Self-pay | Admitting: Internal Medicine

## 2013-10-22 ENCOUNTER — Ambulatory Visit (INDEPENDENT_AMBULATORY_CARE_PROVIDER_SITE_OTHER): Payer: Commercial Indemnity | Admitting: Internal Medicine

## 2013-10-22 VITALS — BP 118/74 | HR 71 | Ht 67.0 in | Wt 197.0 lb

## 2013-10-22 DIAGNOSIS — I2589 Other forms of chronic ischemic heart disease: Secondary | ICD-10-CM

## 2013-10-22 DIAGNOSIS — R002 Palpitations: Secondary | ICD-10-CM

## 2013-10-22 DIAGNOSIS — I255 Ischemic cardiomyopathy: Secondary | ICD-10-CM

## 2013-10-22 DIAGNOSIS — Z95810 Presence of automatic (implantable) cardiac defibrillator: Secondary | ICD-10-CM

## 2013-10-22 LAB — MDC_IDC_ENUM_SESS_TYPE_INCLINIC
Battery Voltage: 3.12 V
HIGH POWER IMPEDANCE MEASURED VALUE: 73 Ohm
HighPow Impedance: 190 Ohm
HighPow Impedance: 342 Ohm
HighPow Impedance: 58 Ohm
Lead Channel Sensing Intrinsic Amplitude: 16.375 mV
Lead Channel Setting Pacing Amplitude: 2.5 V
Lead Channel Setting Pacing Pulse Width: 0.4 ms
Lead Channel Setting Sensing Sensitivity: 0.3 mV
MDC IDC MSMT LEADCHNL RV IMPEDANCE VALUE: 418 Ohm
MDC IDC MSMT LEADCHNL RV PACING THRESHOLD AMPLITUDE: 1 V
MDC IDC MSMT LEADCHNL RV PACING THRESHOLD PULSEWIDTH: 0.4 ms
MDC IDC MSMT LEADCHNL RV SENSING INTR AMPL: 14.75 mV
MDC IDC SESS DTM: 20150611142215
MDC IDC SET ZONE DETECTION INTERVAL: 300 ms
MDC IDC SET ZONE DETECTION INTERVAL: 360 ms
MDC IDC STAT BRADY RV PERCENT PACED: 0.01 %
Zone Setting Detection Interval: 350 ms

## 2013-10-22 NOTE — Patient Instructions (Addendum)
Your physician has recommended you make the following change in your medication:  1) STOP Metoprolol  Remote monitoring is used to monitor your Pacemaker of ICD from home. This monitoring reduces the number of office visits required to check your device to one time per year. It allows [place] to keep an eye on the functioning of your device to ensure it is working properly. You are scheduled for a device check from home on 01/25/14. You may send your transmission at any time that day. If you have a wireless device, the transmission will be sent automatically. After your physician reviews your transmission, you will receive a postcard with your next transmission date.  Your physician wants you to follow-up in: October with Dr. Johnsie Cancel & an echo. You will receive a reminder letter in the mail two months in advance. If you don't receive a letter, please call our office to schedule the follow-up appointment.  Your physician has requested that you have an echocardiogram in October when you see Dr. Johnsie Cancel. Echocardiography is a painless test that uses sound waves to create images of your heart. It provides your doctor with information about the size and shape of your heart and how well your heart's chambers and valves are working. This procedure takes approximately one hour. There are no restrictions for this procedure.  Your physician wants you to follow-up in: 1 year with [name].  You will receive a reminder letter in the mail two months in advance. If you don't receive a letter, please call our office to schedule the follow-up appointment.

## 2013-10-22 NOTE — Progress Notes (Signed)
Patient Care Team: [name], MD as PCP - General (Family Medicine) Josue Hector, MD as Consulting Physician (Cardiology) Deboraha Sprang, MD as Consulting Physician (Cardiology) Louis Meckel, MD as Consulting Physician (Nephrology) Rosetta Posner, MD as Consulting Physician (Vascular Surgery) Arvil Persons, MD as Consulting Physician (Urology)   HPI  Roy Dyer is a 48 y.o. male Seen in followup for ICD implanted for primary prevention. He has primarily an ischemic myopathy status post LAD occlusion and bare-metal stenti with stentng of his LAD with prior stenting of the circumflex and RCA.  MRI 7/12 demonstrated EF 20%   Echocardiogram 3/13 EF 35% LVH.   He underwent dialysis 2014. He does been off of dialysis. It is associated with reemergence of being clammy all the time    His metoprolol tartrate is currently being taken once a day because of hypotension.   The [facility] records were reviewed   Past Medical History  Diagnosis Date  . Lupus   . CAD (coronary artery disease)      stents RCA/Circ 2001, BMS to LAD 07/2010  . Cardiomyopathy     Ischemic:  02/2011 EF 30-35%.   Single chamber ICD 11/20/10 ([name])  . Gout     s/p renal transplant he was weened off of his allopurinol.  . 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 Left ventricular thrombus 2012    Re-eval 02/2011 showed thrombus RESOLVED, so coumadin was d/c'd (was on it for 25mo)  . implantable cardiac defibrillator single chamber     Medtronic (due to low EF)  . Hypertension   . Myocardial infarction 2001 and 2012    3 stents and defib placed in July  . GERD (gastroesophageal reflux disease)     Hx of esoph stricture and dilatation  . Hiatal hernia   . End stage renal disease 04/24/2011    Secondary to SLE: HD 06/2011-10/2012 (then got renal transplant)  . History of renal transplant 09/02/2013    10/2012 [facility]     Past Surgical History    Procedure Laterality Date  . Stents      05-2010 and 2- in 2010  . Partial hip arthroplasty      left  . Renal biopsy    . Tympanostomy tube placement  48 yrs old  . Joint replacement    . Av fistula placement  03/28/2011    Procedure: ARTERIOVENOUS (AV) FISTULA CREATION;  Surgeon: Rosetta Posner, MD;  Location: [facility];  Service: Vascular;  Laterality: Left;  Creation of Left radiocephallic cimino fistula  . Cardiac catheterization      08/2010  . US echocardiography  12/2010  . Cardiovascular stress test  07/2010  . Av fistula placement  04/27/2011    Procedure: ARTERIOVENOUS (AV) FISTULA CREATION;  Surgeon: Rosetta Posner, MD;  Location: [facility];  Service: Vascular;  Laterality: Left;  left basilic vein transposition  . Insert / replace / remove pacemaker      medtronic        dr Juleen China    [place]   icd only  . Total hip arthroplasty  07/09/2011    Procedure: TOTAL HIP ARTHROPLASTY;  Surgeon: Kerin Salen, MD;  Location: [facility];  Service: Orthopedics;  Laterality: Right;  . Kidney transplant  11/2012    [facility]--Dr. Blair Heys  . Esophagogastroduodenoscopy  02/2009    Done  by Dr. Fuller Plan for hematemesis; esophagitis found    Current Outpatient Prescriptions  Medication Sig Dispense Refill  . allopurinol (ZYLOPRIM) 300 MG tablet Take 150 mg by mouth daily. TAKE 1 TABLET (300 MG TOTAL) BY MOUTH DAILY.      [name] aspirin 81 MG tablet Take 1 tablet (81 mg total) by mouth daily.  30 tablet  2  . atorvastatin (LIPITOR) 80 MG tablet Take 1 tablet (80 mg total) by mouth daily.  30 tablet  0  . Cholecalciferol (VITAMIN D3) 5000 UNITS TABS Take by mouth.      . clopidogrel (PLAVIX) 75 MG tablet TAKE 1 TABLET (75 MG TOTAL) BY MOUTH DAILY.  90 tablet  1  . esomeprazole (NEXIUM) 40 MG capsule Take 1 capsule (40 mg total) by mouth 2 (two) times daily before a meal. 1 tab po bid  180 capsule  1  . esomeprazole (NEXIUM) 40 MG capsule TAKE 1 CAPSULE (40 MG TOTAL) BY MOUTH DAILY BEFORE BREAKFAST TWICE A DAY  60  capsule  3  . metoprolol tartrate (LOPRESSOR) 25 MG tablet Take 25 mg by mouth daily.      . Mycophenolate Mofetil (CELLCEPT PO) Take 250 mg by mouth daily. 3 tabs in the am & 3 tabs in the pm      . niacin (NIASPAN) 500 MG CR tablet Take 2 tablets (1,000 mg total) by mouth at bedtime.  30 tablet  1  . NITROSTAT 0.4 MG SL tablet USE 1 TAB UNDER TONGUE EVERY 5 MINUTES UP TO 3 DOSES AS NEEDED  25 tablet  2  . predniSONE (DELTASONE) 5 MG tablet Take 5 mg by mouth daily.      [name] sulfamethoxazole-trimethoprim (BACTRIM DS) 800-160 MG per tablet Take 1 tablet by mouth 3 (three) times a week.      . tacrolimus (PROGRAF) 1 MG capsule Take 0.5 mg by mouth 2 (two) times daily. Takes two 0.5mg  tabs in the am and one 0.5mg  tab in the pm      . valsartan (DIOVAN) 40 MG tablet Take 1 tablet (40 mg total) by mouth daily.  90 tablet  1  . zolpidem (AMBIEN) 5 MG tablet Take 1 tablet (5 mg total) by mouth at bedtime as needed.  30 tablet  5  . [DISCONTINUED] calcium carbonate, dosed in mg elemental calcium, 1250 MG/5ML Take 5 mLs (500 mg of elemental calcium total) by mouth every 6 (six) hours as needed.  450 mL  2  . [DISCONTINUED] dexlansoprazole (DEXILANT) 60 MG capsule Take 1 capsule (60 mg total) by mouth daily.  30 capsule  2   No current facility-administered medications for this visit.    No Known Allergies  Review of Systems negative except from HPI and PMH  Physical Exam BP 118/74  Pulse 71  Ht 5\' 7"  (1.702 m)  Wt 197 lb (89.359 kg)  BMI 30.85 kg/m2 Well developed and well nourished in no acute distress HENT normal E scleral and icterus clear Neck Supple Clear to ausculation Device pocket well healed; without hematoma or erythema.  There is no tethering   Regular rate and rhythm, no murmurs gallops or rub Soft with active bowel sounds No clubbing cyanosis  Edema Alert and oriented, grossly normal motor and sensory function Skin Warm and clammy    Assessment and  Plan  Ischemic  cardiomyopathy  Implantable defibrillator Medtronic The patient's device was interrogated.  The information was reviewed. No changes were made in the programming.  Nonsustained ventricular tachycardia  Borderline hypotension  Renal transplantation  He is doing exceptionally well following his transplant. He is euvolemic. Because of the issue of hypotension and the fact that he is taking metoprolol tartrate just once a day,  I will have him stop it. We will arrange for him to have an echo to see Dr. Johnsie Cancel. At that point a decision can be made as to whether it once daily beta blocker appropriately resumed.  Currently he is without symptoms related to his coronary artery disease.  Nonsustained ventricular tachycardia was identified on his defibrillator but was without symptoms

## 2013-10-24 ENCOUNTER — Encounter: Payer: Self-pay | Admitting: Family Medicine

## 2013-11-18 ENCOUNTER — Telehealth: Payer: Self-pay | Admitting: Cardiology

## 2013-11-18 NOTE — Telephone Encounter (Signed)
LMOVM on both #'s requesting pt send a manual transmission from home for his device due to updates.

## 2013-12-31 ENCOUNTER — Encounter: Payer: Self-pay | Admitting: Family Medicine

## 2013-12-31 ENCOUNTER — Ambulatory Visit (INDEPENDENT_AMBULATORY_CARE_PROVIDER_SITE_OTHER): Payer: Commercial Indemnity | Admitting: Family Medicine

## 2013-12-31 VITALS — BP 134/81 | HR 80 | Temp 98.2°F | Resp 16 | Ht 67.0 in | Wt 197.0 lb

## 2013-12-31 DIAGNOSIS — I2589 Other forms of chronic ischemic heart disease: Secondary | ICD-10-CM

## 2013-12-31 DIAGNOSIS — I255 Ischemic cardiomyopathy: Secondary | ICD-10-CM

## 2013-12-31 DIAGNOSIS — Z940 Kidney transplant status: Secondary | ICD-10-CM

## 2013-12-31 DIAGNOSIS — K219 Gastro-esophageal reflux disease without esophagitis: Secondary | ICD-10-CM | POA: Insufficient documentation

## 2013-12-31 DIAGNOSIS — E782 Mixed hyperlipidemia: Secondary | ICD-10-CM

## 2013-12-31 DIAGNOSIS — I10 Essential (primary) hypertension: Secondary | ICD-10-CM

## 2013-12-31 NOTE — Assessment & Plan Note (Signed)
Doing well, working on exercising more. Continue current meds.  Metoprolol has been d/c'd by [name]. Keep cardiology f/u with both Dr.'s [name] and [place].

## 2013-12-31 NOTE — Assessment & Plan Note (Signed)
The current medical regimen is effective;  continue present plan and medications. Lab Results  Component Value Date   CHOL 104 09/03/2013   HDL 57.80 09/03/2013   LDLCALC 30 09/03/2013   TRIG 79.0 09/03/2013   CHOLHDL 2 09/03/2013

## 2013-12-31 NOTE — Progress Notes (Signed)
OFFICE NOTE  12/31/2013  CC:  Chief Complaint  Patient presents with  . Follow-up   HPI: Patient is a 48 y.o. 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 Also has ischemic cardiomyopathy and ICD, mixed hyperlipidemia, and GERD.    He is feeling great. Working on diet and exercise. [name] recently stopped his metoprolol, with plan made to recheck ECHO along with his next f/u with Dr. Sheryle Hail plan for resumption (or not) of beta blocker can be made.  I reviewed his labs on his phone today from [place] f/u 10/2013 and they were all stable.  GERD feeling asymptomatic on nexium 40mg  bid.  ROS: no chest pain, no SOB, no dizziness, no palpitations. Has some morning and evening chest congestion that clears with a few aggressive coughing spells.  No fevers.  Pertinent PMH:  Past medical, surgical, social, and family history reviewed and no changes are noted since last office visit.  MEDS:  Outpatient Prescriptions Prior to Visit  Medication Sig Dispense Refill  . allopurinol (ZYLOPRIM) 300 MG tablet Take 150 mg by mouth daily. TAKE 1 TABLET (300 MG TOTAL) BY MOUTH DAILY.      [name] aspirin 81 MG tablet Take 1 tablet (81 mg total) by mouth daily.  30 tablet  2  . atorvastatin (LIPITOR) 80 MG tablet Take 1 tablet (80 mg total) by mouth daily.  30 tablet  0  . Cholecalciferol (VITAMIN D3) 5000 UNITS TABS Take by mouth.      . clopidogrel (PLAVIX) 75 MG tablet TAKE 1 TABLET (75 MG TOTAL) BY MOUTH DAILY.  90 tablet  1  . esomeprazole (NEXIUM) 40 MG capsule Take 1 capsule (40 mg total) by mouth 2 (two) times daily before a meal. 1 tab po bid  180 capsule  1  . esomeprazole (NEXIUM) 40 MG capsule TAKE 1 CAPSULE (40 MG TOTAL) BY MOUTH DAILY BEFORE BREAKFAST TWICE A DAY  60 capsule  3  . Mycophenolate Mofetil (CELLCEPT PO) Take 250 mg by mouth daily. 3 tabs in the am & 3 tabs in the pm      . niacin (NIASPAN) 500 MG CR tablet Take 2 tablets  (1,000 mg total) by mouth at bedtime.  30 tablet  1  . NITROSTAT 0.4 MG SL tablet USE 1 TAB UNDER TONGUE EVERY 5 MINUTES UP TO 3 DOSES AS NEEDED  25 tablet  2  . predniSONE (DELTASONE) 5 MG tablet Take 5 mg by mouth daily.      [name] sulfamethoxazole-trimethoprim (BACTRIM DS) 800-160 MG per tablet Take 1 tablet by mouth 3 (three) times a week.      . tacrolimus (PROGRAF) 1 MG capsule Take 0.5 mg by mouth 2 (two) times daily. Takes two 0.5mg  tabs in the am and one 0.5mg  tab in the pm      . valsartan (DIOVAN) 40 MG tablet Take 1 tablet (40 mg total) by mouth daily.  90 tablet  1  . zolpidem (AMBIEN) 5 MG tablet Take 1 tablet (5 mg total) by mouth at bedtime as needed.  30 tablet  5   No facility-administered medications prior to visit.    PE: Blood pressure 134/81, pulse 80, temperature 98.2 F (36.8 C), temperature source Oral, resp. rate 16, height 5\' 7"  (1.702 m), weight 197 lb (89.359 kg), SpO2 96.00%. Gen: Alert, well appearing.  Patient is oriented to person, place, time, and situation. CV: RRR, 2/6 syst  murmur Chest is clear, no wheezing or rales. Normal symmetric air entry throughout both lung fields. No chest wall deformities or tenderness. EXT: no clubbing, cyanosis, or edema.   LAB: none today  IMPRESSION AND PLAN:  History of renal transplant Doing well. I reviewed all of his recent labs via [facility] done 10/2013 and they are all stable, including viral screening and tacrolimus level. His next f/u with them is 01/2014.  ESSENTIAL HYPERTENSION, BENIGN The current medical regimen is effective;  continue present plan and medications.   Mixed hyperlipidemia The current medical regimen is effective;  continue present plan and medications. Lab Results  Component Value Date   CHOL 104 09/03/2013   HDL 57.80 09/03/2013   LDLCALC 30 09/03/2013   TRIG 79.0 09/03/2013   CHOLHDL 2 09/03/2013     ischemic cardiomyopathy status post anterolateral MI Doing well, working on exercising  more. Continue current meds.  Metoprolol has been d/c'd by [name]. Keep cardiology f/u with both Dr.'s [name] and [place].  GERD (gastroesophageal reflux disease) Stable/improved on nexium 40mg  bid: continue.   Preventative health: still unsure if [facility] transplant clinic has given prevnar 13 or not: their note from 10/2013 does not indicate that they did but pt thinks they might have.  He'll double check at next f/u with them.  An After Visit Summary was printed and given to the patient.  FOLLOW UP: 4 mo

## 2013-12-31 NOTE — Assessment & Plan Note (Signed)
The current medical regimen is effective;  continue present plan and medications.  

## 2013-12-31 NOTE — Assessment & Plan Note (Signed)
Doing well. I reviewed all of his recent labs via [facility] done 10/2013 and they are all stable, including viral screening and tacrolimus level. His next f/u with them is 01/2014.

## 2013-12-31 NOTE — Assessment & Plan Note (Signed)
Stable/improved on nexium 40mg  bid: continue.

## 2013-12-31 NOTE — Progress Notes (Signed)
Pre visit review using our clinic review tool, if applicable. No additional management support is needed unless otherwise documented below in the visit note. 

## 2014-01-01 ENCOUNTER — Telehealth: Payer: Self-pay | Admitting: Family Medicine

## 2014-01-01 NOTE — Telephone Encounter (Signed)
Relevant patient education assigned to patient using Emmi. ° °

## 2014-01-24 ENCOUNTER — Encounter: Payer: Self-pay | Admitting: Internal Medicine

## 2014-01-25 ENCOUNTER — Ambulatory Visit (INDEPENDENT_AMBULATORY_CARE_PROVIDER_SITE_OTHER): Payer: Commercial Indemnity | Admitting: *Deleted

## 2014-01-25 DIAGNOSIS — I255 Ischemic cardiomyopathy: Secondary | ICD-10-CM

## 2014-01-25 DIAGNOSIS — I2589 Other forms of chronic ischemic heart disease: Secondary | ICD-10-CM

## 2014-01-25 LAB — MDC_IDC_ENUM_SESS_TYPE_REMOTE
Battery Voltage: 3.11 V
Brady Statistic RV Percent Paced: 0.01 %
HIGH POWER IMPEDANCE MEASURED VALUE: 228 Ohm
HIGH POWER IMPEDANCE MEASURED VALUE: 61 Ohm
HighPow Impedance: 304 Ohm
HighPow Impedance: 51 Ohm
Lead Channel Impedance Value: 399 Ohm
Lead Channel Pacing Threshold Amplitude: 1 V
Lead Channel Pacing Threshold Pulse Width: 0.4 ms
Lead Channel Sensing Intrinsic Amplitude: 16.375 mV
Lead Channel Sensing Intrinsic Amplitude: 16.375 mV
Lead Channel Setting Pacing Amplitude: 2.5 V
Lead Channel Setting Pacing Pulse Width: 0.4 ms
Lead Channel Setting Sensing Sensitivity: 0.3 mV
MDC IDC SESS DTM: 20150914003609
MDC IDC SET ZONE DETECTION INTERVAL: 360 ms
Zone Setting Detection Interval: 300 ms
Zone Setting Detection Interval: 350 ms

## 2014-01-25 NOTE — Progress Notes (Signed)
Remote ICD transmission.   

## 2014-02-08 ENCOUNTER — Ambulatory Visit (INDEPENDENT_AMBULATORY_CARE_PROVIDER_SITE_OTHER): Payer: Commercial Indemnity

## 2014-02-08 DIAGNOSIS — Z23 Encounter for immunization: Secondary | ICD-10-CM

## 2014-02-15 ENCOUNTER — Encounter: Payer: Self-pay | Admitting: Cardiology

## 2014-02-16 ENCOUNTER — Ambulatory Visit: Payer: Commercial Indemnity | Admitting: Cardiovascular Disease

## 2014-02-23 ENCOUNTER — Other Ambulatory Visit: Payer: Self-pay | Admitting: Family Medicine

## 2014-02-23 MED ORDER — ALLOPURINOL 300 MG PO TABS: 300.0000 mg | ORAL_TABLET | Freq: Every day | ORAL | Status: DC

## 2014-02-23 MED ORDER — NIACIN ER (ANTIHYPERLIPIDEMIC) 500 MG PO TBCR: 1000.0000 mg | EXTENDED_RELEASE_TABLET | Freq: Every day | ORAL | Status: DC

## 2014-02-23 MED ORDER — ESOMEPRAZOLE MAGNESIUM 40 MG PO CPDR: 40.0000 mg | DELAYED_RELEASE_CAPSULE | Freq: Two times a day (BID) | ORAL | Status: DC

## 2014-02-23 MED ORDER — CLOPIDOGREL BISULFATE 75 MG PO TABS: ORAL_TABLET | ORAL | Status: DC

## 2014-02-23 MED ORDER — VALSARTAN 40 MG PO TABS: 40.0000 mg | ORAL_TABLET | Freq: Every day | ORAL | Status: DC

## 2014-02-23 NOTE — Telephone Encounter (Deleted)
Pt mail order requesting rf of nias

## 2014-02-23 NOTE — Telephone Encounter (Signed)
Rx sent to [facility].

## 2014-02-26 ENCOUNTER — Ambulatory Visit (HOSPITAL_COMMUNITY): Payer: Commercial Indemnity | Attending: Internal Medicine | Admitting: Radiology

## 2014-02-26 ENCOUNTER — Other Ambulatory Visit (HOSPITAL_COMMUNITY): Payer: Commercial Indemnity

## 2014-02-26 ENCOUNTER — Encounter: Payer: Self-pay | Admitting: Cardiovascular Disease

## 2014-02-26 ENCOUNTER — Ambulatory Visit (INDEPENDENT_AMBULATORY_CARE_PROVIDER_SITE_OTHER): Payer: Commercial Indemnity | Admitting: Cardiovascular Disease

## 2014-02-26 VITALS — BP 124/78 | HR 90 | Ht 67.0 in | Wt 197.0 lb

## 2014-02-26 DIAGNOSIS — Z940 Kidney transplant status: Secondary | ICD-10-CM

## 2014-02-26 DIAGNOSIS — R002 Palpitations: Secondary | ICD-10-CM

## 2014-02-26 DIAGNOSIS — I25118 Atherosclerotic heart disease of native coronary artery with other forms of angina pectoris: Secondary | ICD-10-CM

## 2014-02-26 DIAGNOSIS — I255 Ischemic cardiomyopathy: Secondary | ICD-10-CM

## 2014-02-26 DIAGNOSIS — I10 Essential (primary) hypertension: Secondary | ICD-10-CM | POA: Diagnosis not present

## 2014-02-26 DIAGNOSIS — E785 Hyperlipidemia, unspecified: Secondary | ICD-10-CM | POA: Diagnosis not present

## 2014-02-26 DIAGNOSIS — E782 Mixed hyperlipidemia: Secondary | ICD-10-CM

## 2014-02-26 DIAGNOSIS — Z95810 Presence of automatic (implantable) cardiac defibrillator: Secondary | ICD-10-CM

## 2014-02-26 MED ORDER — NEBIVOLOL HCL 5 MG PO TABS: 5.0000 mg | ORAL_TABLET | Freq: Every day | ORAL | Status: DC

## 2014-02-26 NOTE — Assessment & Plan Note (Signed)
Cholesterol is at goal.  Continue current dose of statin and diet Rx.  No myalgias or side effects.  F/U  LFT's in 6 months. Lab Results  Component Value Date   LDLCALC 30 09/03/2013

## 2014-02-26 NOTE — Progress Notes (Signed)
Echocardiogram performed.  

## 2014-02-26 NOTE — Patient Instructions (Signed)
Your physician recommends that you schedule a follow-up appointment in: NEXT AVAILABLE WITH DR [facility]  Your physician has recommended you make the following change in your medication:  START BYSTOLIC  5MG   EVERY DAY

## 2014-02-26 NOTE — Assessment & Plan Note (Signed)
Functional class 1  EF about 30% previous apical thrombus resolved Add bystolic and see if we can get HR down now that he is off dialysis  He has stents in all 3 arteries and wit low EF should be on beta blocker

## 2014-02-26 NOTE — Assessment & Plan Note (Signed)
Continue to f/u at [facility] q 35months  Cr 1.5  Stable

## 2014-02-26 NOTE — Assessment & Plan Note (Signed)
Well controlled.  Continue current medications and low sodium Dash type diet.    

## 2014-02-26 NOTE — Assessment & Plan Note (Signed)
Reviewed [facility] normal function some NSVT but no Rx delivered

## 2014-02-26 NOTE — Assessment & Plan Note (Signed)
Stable with no angina and good activity level.  Continue medical Rx Suspect antero apex is non viable  No chest pain or indication for stress testing at this time ASA and add back beta blocker

## 2014-02-26 NOTE — Progress Notes (Signed)
Patient ID: KELYN DEMMONS, male   DOB: 05/16/1965, 48 y.o.   MRN: [id number] Mr. Ahlborn is a 48 year old male with a history of coronary artery disease who suffered anterior MI in March of 2012. He underwent bare metal stenting to his LAD at that time. Had previous stents to circ and RCA in 2001 At discharge, he was placed on a LifeVest because of depressed LV function. The patient was maintained on optimal medical therapy. His ejection fraction was reassessed 90 days post revascularization and was found to still be less than 35%. Had BiV AICD placed by Dr [name] 11/20/10. DFT;s not tested due to history of large mural apical thrombus. Repeat MRI done 11/17/10 showed resolution of thrombus and EF 28% Coumadin stopped  Had hip replaced in 3/12 and had worse renal failure and is now on dialysis. Makes very little urine and weight continues to drop Lost over 70lbs over the last year Recent remote AICD check last week was fine  Nausea resolved off amiodarone.  BP quite low on dialysis and has to hold ACE and frequently coreg  Being evaluated at [facility] for renal transplant  Echo 3/13  Study Conclusions  - Left ventricle: The cavity size was moderately dilated. Wall thickness was increased in a pattern of mild LVH. The estimated ejection fraction was 35%. Akinesis of the mid-distalanteroseptal and apical myocardium; consistent with infarction. No evidence of thrombus. Acoustic contrast opacification revealed no evidence ofthrombus. - Mitral valve: Mild regurgitation. - Left atrium: The atrium was moderately dilated. - Right atrium: The atrium was mildly dilated. - Atrial septum: No defect or patent foramen ovale was identified. - Pulmonary arteries: PA peak pressure: 16mm Hg (S).   Had  Renal transplant at [facility] in June with niece as donor. Doing well Did not have cath before procedure  No complaints Still with fistula in LUE On 3 drugs for rejection No chest pain dyspnea or palpitations  Beta blocker stopped  by Dr [name] due to low BP  BP better now that not doing dialysis HR still high in 90's  Reviewed echo with him today and EF about 30% with anteroapical akinesis and mild MR    ROS: Denies fever, malais, weight loss, blurry vision, decreased visual acuity, cough, sputum, SOB, hemoptysis, pleuritic pain, palpitaitons, heartburn, abdominal pain, melena, lower extremity edema, claudication, or rash.  All other systems reviewed and negative  General: Affect appropriate Healthy:  appears stated age 29: normal Neck supple with no adenopathy AICD under right clavicle  JVP normal no bruits no thyromegaly Lungs clear with no wheezing and good diaphragmatic motion Heart:  S1/S2 no murmur, no rub, gallop or click PMI normal Abdomen: benighn, BS positve, no tenderness, no AAA  Transplant in RLQ  no bruit.  No HSM or HJR Distal pulses intact with no bruits  Fistula LUE with thrill  No edema Neuro non-focal Skin warm and dry No muscular weakness   Current Outpatient Prescriptions  Medication Sig Dispense Refill  . allopurinol (ZYLOPRIM) 300 MG tablet Take 1 tablet (300 mg total) by mouth daily.  90 tablet  1  . aspirin 81 MG tablet Take 1 tablet (81 mg total) by mouth daily.  30 tablet  2  . atorvastatin (LIPITOR) 80 MG tablet Take 1 tablet (80 mg total) by mouth daily.  30 tablet  0  . Cholecalciferol (VITAMIN D3) 5000 UNITS TABS Take by mouth.      . clopidogrel (PLAVIX) 75 MG tablet TAKE 1 TABLET (75 MG TOTAL)  BY MOUTH DAILY.  90 tablet  1  . esomeprazole (NEXIUM) 40 MG capsule TAKE 1 CAPSULE (40 MG TOTAL) BY MOUTH DAILY BEFORE BREAKFAST TWICE A DAY  60 capsule  3  . esomeprazole (NEXIUM) 40 MG capsule Take 1 capsule (40 mg total) by mouth 2 (two) times daily before a meal. 1 tab po bid  180 capsule  1  . Mycophenolate Mofetil (CELLCEPT PO) Take 250 mg by mouth daily. 3 tabs in the am & 3 tabs in the pm      . niacin (NIASPAN) 500 MG CR tablet Take 2 tablets (1,000 mg total) by mouth at  bedtime.  180 tablet  1  . NITROSTAT 0.4 MG SL tablet USE 1 TAB UNDER TONGUE EVERY 5 MINUTES UP TO 3 DOSES AS NEEDED  25 tablet  2  . predniSONE (DELTASONE) 5 MG tablet Take 5 mg by mouth daily.      [name] sulfamethoxazole-trimethoprim (BACTRIM DS) 800-160 MG per tablet Take 1 tablet by mouth 3 (three) times a week.      . tacrolimus (PROGRAF) 1 MG capsule Take 0.5 mg by mouth 2 (two) times daily. Takes two 0.5mg  tabs in the am and one 0.5mg  tab in the pm      . valsartan (DIOVAN) 40 MG tablet Take 1 tablet (40 mg total) by mouth daily.  90 tablet  1  . zolpidem (AMBIEN) 5 MG tablet Take 1 tablet (5 mg total) by mouth at bedtime as needed.  30 tablet  5  . [DISCONTINUED] calcium carbonate, dosed in mg elemental calcium, 1250 MG/5ML Take 5 mLs (500 mg of elemental calcium total) by mouth every 6 (six) hours as needed.  450 mL  2  . [DISCONTINUED] dexlansoprazole (DEXILANT) 60 MG capsule Take 1 capsule (60 mg total) by mouth daily.  30 capsule  2   No current facility-administered medications for this visit.    Allergies  Review of patient's allergies indicates no known allergies.  Electrocardiogram:  SR rate 95 PaC old anterior MI  12/13   Today SR rate 92  biatrial enlargement anterolateral infarct no change   Assessment and Plan

## 2014-03-10 ENCOUNTER — Other Ambulatory Visit: Payer: Self-pay | Admitting: *Deleted

## 2014-03-10 DIAGNOSIS — I255 Ischemic cardiomyopathy: Secondary | ICD-10-CM

## 2014-03-31 ENCOUNTER — Telehealth: Payer: Self-pay | Admitting: *Deleted

## 2014-03-31 ENCOUNTER — Ambulatory Visit (INDEPENDENT_AMBULATORY_CARE_PROVIDER_SITE_OTHER): Payer: Commercial Indemnity | Admitting: Cardiovascular Disease

## 2014-03-31 ENCOUNTER — Encounter: Payer: Self-pay | Admitting: Cardiovascular Disease

## 2014-03-31 VITALS — BP 110/76 | HR 71 | Ht 67.0 in | Wt 196.4 lb

## 2014-03-31 DIAGNOSIS — N186 End stage renal disease: Secondary | ICD-10-CM

## 2014-03-31 DIAGNOSIS — R079 Chest pain, unspecified: Secondary | ICD-10-CM

## 2014-03-31 DIAGNOSIS — I2510 Atherosclerotic heart disease of native coronary artery without angina pectoris: Secondary | ICD-10-CM

## 2014-03-31 DIAGNOSIS — Z95810 Presence of automatic (implantable) cardiac defibrillator: Secondary | ICD-10-CM

## 2014-03-31 DIAGNOSIS — E782 Mixed hyperlipidemia: Secondary | ICD-10-CM

## 2014-03-31 DIAGNOSIS — I2583 Coronary atherosclerosis due to lipid rich plaque: Secondary | ICD-10-CM

## 2014-03-31 NOTE — Patient Instructions (Signed)
Your physician recommends that you continue on your current medications as directed. Please refer to the Current Medication list given to you today.  Your physician wants you to follow-up in: 6 Months Dr. Johnsie Cancel.  You will receive a reminder letter in the mail two months in advance. If you don't receive a letter, please call our office to schedule the follow-up appointment @ [phone].   Your physician has requested that you have en exercise stress myoview. DX: CORONARY DISEASE/CHEST PAIN. For further information please visit [email or web address]. Please follow instruction sheet, as given.

## 2014-03-31 NOTE — Progress Notes (Signed)
Patient ID: Roy Dyer, male   DOB: 1966-01-02, 48 y.o.   MRN: [id number] Mr. Roy Dyer is a 48 year old male with a history of coronary artery disease who suffered anterior MI in March of 2012. He underwent bare metal stenting to his LAD at that time. Had previous stents to circ and RCA in 2001 At discharge, he was placed on a LifeVest because of depressed LV function. The patient was maintained on optimal medical therapy. His ejection fraction was reassessed 90 days post revascularization and was found to still be less than 35%. Had BiV AICD placed by Dr [name] 11/20/10. DFT;s not tested due to history of large mural apical thrombus. Repeat MRI done 11/17/10 showed resolution of thrombus and EF 28% Coumadin stopped  Had hip replaced in 3/12 and had worse renal failure and is now on dialysis. Makes very little urine and weight continues to drop Lost over 70lbs over the last year Recent remote AICD check last week was fine  Nausea resolved off amiodarone.  BP quite low on dialysis and has to hold ACE and frequently coreg  Being evaluated at [facility] for renal transplant  Echo 3/13  Study Conclusions  - Left ventricle: The cavity size was moderately dilated. Wall thickness was increased in a pattern of mild LVH. The estimated ejection fraction was 35%. Akinesis of the mid-distalanteroseptal and apical myocardium; consistent with infarction. No evidence of thrombus. Acoustic contrast opacification revealed no evidence ofthrombus. - Mitral valve: Mild regurgitation. - Left atrium: The atrium was moderately dilated. - Right atrium: The atrium was mildly dilated. - Atrial septum: No defect or patent foramen ovale was identified. - Pulmonary arteries: PA peak pressure: 8mm Hg (S).   Had Renal transplant at [facility] in June with niece as donor. Doing well Did not have cath before procedure  No complaints Still with fistula in LUE On 3 drugs for rejection No chest pain dyspnea or palpitations  Beta blocker  stopped by Dr [name] due to low BP  BP better now that not doing dialysis HR still high in 90's Reviewed echo with him today and EF about 30% with anteroapical akinesis and mild MR  Bystolic started a month ago and HR improved in 60-70's now  Has had some atypical SSCP  Describes it as pinching Not always exertional Can happen in bed and sometimes postional  Occuring weekly now though  .     ROS: Denies fever, malais, weight loss, blurry vision, decreased visual acuity, cough, sputum, SOB, hemoptysis, pleuritic pain, palpitaitons, heartburn, abdominal pain, melena, lower extremity edema, claudication, or rash.  All other systems reviewed and negative  General: Affect appropriate Healthy:  appears stated age 3: normal Neck supple with no adenopathy JVP normal no bruits no thyromegaly Lungs clear with no wheezing and good diaphragmatic motion Heart:  S1/S2 SEM  murmur, no rub, gallop or click PMI normal Abdomen: benighn, BS positve, striae on sides transplanted kidney  no bruit.  No HSM or HJR Distal pulses intact with no bruits No edema Neuro non-focal Skin warm and dry No muscular weakness   Current Outpatient Prescriptions  Medication Sig Dispense Refill  . allopurinol (ZYLOPRIM) 300 MG tablet Take 150 mg by mouth daily.    [name] aspirin 81 MG tablet Take 1 tablet (81 mg total) by mouth daily. 30 tablet 2  . atorvastatin (LIPITOR) 80 MG tablet Take 1 tablet (80 mg total) by mouth daily. 30 tablet 0  . clopidogrel (PLAVIX) 75 MG tablet TAKE 1 TABLET (75 MG  TOTAL) BY MOUTH DAILY. 90 tablet 1  . esomeprazole (NEXIUM) 40 MG capsule Take 1 capsule (40 mg total) by mouth 2 (two) times daily before a meal. 1 tab po bid 180 capsule 1  . Mycophenolate Mofetil (CELLCEPT PO) Take 250 mg by mouth daily. 3 tabs in the am & 3 tabs in the pm    . nebivolol (BYSTOLIC) 5 MG tablet Take 1 tablet (5 mg total) by mouth daily. 30 tablet 11  . niacin (NIASPAN) 500 MG CR tablet Take 2 tablets  (1,000 mg total) by mouth at bedtime. 180 tablet 1  . NITROSTAT 0.4 MG SL tablet USE 1 TAB UNDER TONGUE EVERY 5 MINUTES UP TO 3 DOSES AS NEEDED 25 tablet 2  . predniSONE (DELTASONE) 5 MG tablet Take 5 mg by mouth daily.    . tacrolimus (PROGRAF) 0.5 MG capsule Take 1 mg (2 pills) at 9 AM and 0.5 mg( 1 pill)  at 9 PM --Z94.0    . valsartan (DIOVAN) 40 MG tablet Take 1 tablet (40 mg total) by mouth daily. 90 tablet 1  . zolpidem (AMBIEN) 5 MG tablet Take 1 tablet (5 mg total) by mouth at bedtime as needed. 30 tablet 5  . Cholecalciferol (VITAMIN D3) 5000 UNITS CAPS Take by mouth. 1 tab daily    . [DISCONTINUED] calcium carbonate, dosed in mg elemental calcium, 1250 MG/5ML Take 5 mLs (500 mg of elemental calcium total) by mouth every 6 (six) hours as needed. 450 mL 2  . [DISCONTINUED] dexlansoprazole (DEXILANT) 60 MG capsule Take 1 capsule (60 mg total) by mouth daily. 30 capsule 2   No current facility-administered medications for this visit.    Allergies  Review of patient's allergies indicates no known allergies.  Electrocardiogram:  SR rate 92 LVH biatrial enlargement anterior MI  LAD   Assessment and Plan

## 2014-03-31 NOTE — Assessment & Plan Note (Signed)
Reviewed his angio from 2012 with him Had no disease in circumflex  30-40% distal RCA with 4 large PL/PDA branches LAD 100% mid with nice DES result by Dr Fletcher Anon  Will order stress myovue to r/o new/progressive disease  Will likely show anteroapical infarct at baseline

## 2014-03-31 NOTE — Assessment & Plan Note (Addendum)
S/P transplant stable  CR 1.5  F/u renal  Still on cellcept, prograf and prednisone

## 2014-03-31 NOTE — Assessment & Plan Note (Signed)
Normal function no d/c  F/u EP

## 2014-03-31 NOTE — Assessment & Plan Note (Signed)
Cholesterol is at goal.  Continue current dose of statin and diet Rx.  No myalgias or side effects.  F/U  LFT's in 6 months. Lab Results  Component Value Date   LDLCALC 30 09/03/2013

## 2014-03-31 NOTE — Telephone Encounter (Signed)
Due to gap in Dr. Johnsie Cancel schedule was requested to come in early for appointment. Pt agreeable to plan.

## 2014-04-02 ENCOUNTER — Ambulatory Visit (INDEPENDENT_AMBULATORY_CARE_PROVIDER_SITE_OTHER): Payer: Commercial Indemnity | Admitting: *Deleted

## 2014-04-02 ENCOUNTER — Ambulatory Visit (HOSPITAL_COMMUNITY): Payer: Commercial Indemnity | Attending: Cardiology | Admitting: Radiology

## 2014-04-02 DIAGNOSIS — I10 Essential (primary) hypertension: Secondary | ICD-10-CM | POA: Insufficient documentation

## 2014-04-02 DIAGNOSIS — Z95810 Presence of automatic (implantable) cardiac defibrillator: Secondary | ICD-10-CM

## 2014-04-02 DIAGNOSIS — I472 Ventricular tachycardia: Secondary | ICD-10-CM | POA: Insufficient documentation

## 2014-04-02 DIAGNOSIS — I2510 Atherosclerotic heart disease of native coronary artery without angina pectoris: Secondary | ICD-10-CM

## 2014-04-02 DIAGNOSIS — R002 Palpitations: Secondary | ICD-10-CM | POA: Diagnosis not present

## 2014-04-02 DIAGNOSIS — I252 Old myocardial infarction: Secondary | ICD-10-CM | POA: Insufficient documentation

## 2014-04-02 DIAGNOSIS — R079 Chest pain, unspecified: Secondary | ICD-10-CM | POA: Diagnosis not present

## 2014-04-02 MED ORDER — TECHNETIUM TC 99M SESTAMIBI GENERIC - CARDIOLITE
33.0000 | Freq: Once | INTRAVENOUS | Status: AC | PRN
  Administered 2014-04-02: 33 via INTRAVENOUS

## 2014-04-02 MED ORDER — TECHNETIUM TC 99M SESTAMIBI GENERIC - CARDIOLITE
11.0000 | Freq: Once | INTRAVENOUS | Status: AC | PRN
  Administered 2014-04-02: 11 via INTRAVENOUS

## 2014-04-02 NOTE — Progress Notes (Signed)
[facility] [address] [place], [place] [address] [phone]    Cardiology Nuclear Med Study  Roy Dyer is a 48 y.o. male     MRN : [id number]     DOB: Apr 05, 1966  Procedure Date: 04/02/2014  Nuclear Med Background Indication for Stress Test:  Evaluation for Ischemia and follow up CAD History:  CAD; 2012 MPI-nml, EF 55%; 2013 Stress Echo-abnl; AICD Cardiac Risk Factors: Hypertension  Symptoms:  Chest Pain (last date of chest discomfort 1 week); Palps   Nuclear Pre-Procedure Caffeine/Decaff Intake:  None NPO After: 10:00pm   Lungs:  clear O2 Sat: 97% on room air. IV 0.9% NS with Angio Cath:  20g  IV Site: R Hand  IV Started by:  Ileene Hutchinson, EMT-P  Chest Size (in):  46 Cup Size: n/a  Height: 5\' 7"  (1.702 m)  Weight:  196 lb (88.905 kg)  BMI:  Body mass index is 30.69 kg/(m^2). Tech Comments:  Holiday representative Med Study 1 or 2 day study: 1 day  Stress Test Type:  Stress  Reading MD: Candee Furbish, MD  Order Authorizing Provider:  P. Johnsie Cancel, MD  Resting Radionuclide: Technetium 32m Sestamibi  Resting Radionuclide Dose: 11.0 mCi   Stress Radionuclide:  Technetium 28m Sestamibi  Stress Radionuclide Dose: 33.0 mCi           Stress Protocol Rest HR: 72 Stress HR: 153  Rest BP: 106/66 Stress BP: 164/78  Exercise Time (min): 4:15 METS: 5.4   Predicted Max HR: 173 bpm % Max HR: 88.44 bpm Rate Pressure Product: 25092   Dose of Adenosine (mg):  n/a Dose of Lexiscan: n/a mg  Dose of Atropine (mg): n/a Dose of Dobutamine: n/a mcg/kg/min (at max HR)  Stress Test Technologist: Glade Lloyd, BS-ES  Nuclear Technologist:  Earl Many, CNMT     Rest Procedure:  Myocardial perfusion imaging was performed at rest 45 minutes following the intravenous administration of Technetium 77m Sestamibi. Rest ECG: Sinus rhythm, right atrial enlargement, left ventricular hypertrophy, poor R-wave progression, nonspecific ST-T wave  changes  Stress Procedure:  The patient exercised on the treadmill utilizing the Bruce Protocol for 4:15 minutes. The patient stopped due to being very SOB, leg fatigue and denied any chest pain.  Technetium 29m Sestamibi was injected at peak exercise and myocardial perfusion imaging was performed after a brief delay. Stress ECG: Frequent PVCs were noted during exercise, possible 4 beat run of ventricular tachycardia, possible aberrantly conducted beats. Challenging strips.  QPS Raw Data Images:  Normal; no motion artifact; normal heart/lung ratio. Stress Images:  There is a large fixed defect involving the mid to apical anterior wall distribution seen at both rest and stress consistent with infarction with no significant peri-infarct ischemia. Rest Images:  As described above. Defect is severe in intensity. Subtraction (SDS):  No evidence of ischemia. Transient Ischemic Dilatation (Normal <1.22):  0.99 Lung/Heart Ratio (Normal <0.45):  0.46  Quantitative Gated Spect Images QGS EDV:  287 ml QGS ESV:  186 ml  Impression Exercise Capacity:  Poor exercise capacity. BP Response:  Normal blood pressure response. Clinical Symptoms:  Very short of breath, leg fatigue ECG Impression:  No ST segment depression however frequent ectopy noted at peak stress as well as 4 beat run of nonsustained ventricular tachycardia. Comparison with Prior Nuclear Study: No images to compare  Overall Impression:  High risk stress nuclear study Secondary to severely reduced ejection fraction of 35% with large  anterior wall infarct noted. No significant ischemia identified..  LV Ejection Fraction: 35%.  LV Wall Motion:  Anteroapical dyskinesis with otherwise generalized decreased systolic function. Dilated chamber size with left ventricular end-diastolic volume of [id number] mL. 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   Candee Furbish, MD

## 2014-04-02 NOTE — Progress Notes (Signed)
Turned off tachy therapies for treadmill test.  Therapies turned back on post treadmill.  Follow up as planned.

## 2014-04-05 ENCOUNTER — Telehealth: Payer: Self-pay | Admitting: Cardiovascular Disease

## 2014-04-05 NOTE — Telephone Encounter (Signed)
Pt was rtn call to christine re myocardial perfusion [phone]

## 2014-04-05 NOTE — Telephone Encounter (Signed)
PT AWARE OF MYOVIEW RESULTS./CY 

## 2014-04-28 ENCOUNTER — Ambulatory Visit (INDEPENDENT_AMBULATORY_CARE_PROVIDER_SITE_OTHER): Payer: Commercial Indemnity | Admitting: *Deleted

## 2014-04-28 DIAGNOSIS — I255 Ischemic cardiomyopathy: Secondary | ICD-10-CM

## 2014-04-28 NOTE — Progress Notes (Signed)
Remote ICD transmission.   

## 2014-04-29 LAB — MDC_IDC_ENUM_SESS_TYPE_REMOTE
Battery Voltage: 3.09 V
HIGH POWER IMPEDANCE MEASURED VALUE: 304 Ohm
HIGH POWER IMPEDANCE MEASURED VALUE: 50 Ohm
HighPow Impedance: 68 Ohm
Lead Channel Impedance Value: 361 Ohm
Lead Channel Pacing Threshold Amplitude: 1.125 V
Lead Channel Pacing Threshold Pulse Width: 0.4 ms
Lead Channel Sensing Intrinsic Amplitude: 14.75 mV
Lead Channel Sensing Intrinsic Amplitude: 14.75 mV
Lead Channel Setting Pacing Amplitude: 2.5 V
MDC IDC SESS DTM: 20151216133128
MDC IDC SET LEADCHNL RV PACING PULSEWIDTH: 0.4 ms
MDC IDC SET LEADCHNL RV SENSING SENSITIVITY: 0.3 mV
MDC IDC SET ZONE DETECTION INTERVAL: 360 ms
MDC IDC STAT BRADY RV PERCENT PACED: 0.12 %
Zone Setting Detection Interval: 300 ms
Zone Setting Detection Interval: 350 ms

## 2014-05-03 ENCOUNTER — Ambulatory Visit (INDEPENDENT_AMBULATORY_CARE_PROVIDER_SITE_OTHER): Payer: Commercial Indemnity | Admitting: Family Medicine

## 2014-05-03 ENCOUNTER — Encounter: Payer: Self-pay | Admitting: Family Medicine

## 2014-05-03 VITALS — BP 137/96 | HR 76 | Temp 97.6°F | Resp 18 | Ht 67.0 in | Wt 199.0 lb

## 2014-05-03 DIAGNOSIS — I10 Essential (primary) hypertension: Secondary | ICD-10-CM

## 2014-05-03 DIAGNOSIS — I255 Ischemic cardiomyopathy: Secondary | ICD-10-CM

## 2014-05-03 DIAGNOSIS — Z940 Kidney transplant status: Secondary | ICD-10-CM

## 2014-05-03 DIAGNOSIS — E785 Hyperlipidemia, unspecified: Secondary | ICD-10-CM

## 2014-05-03 NOTE — Progress Notes (Addendum)
OFFICE NOTE  05/12/2014  CC:  Chief Complaint  Patient presents with  . Follow-up     HPI: Patient is a 48 y.o. Caucasian male who is here for routine f/u renal transplant, CAD with dilated ischemic cardiomyopathy, hyperlipidemia, HTN, lupus that has been quiescent. Feeling great.  He told his cardiologist about some mildly bothersome chest discomfort at most recent f/u visit, therefore he had repeat echo 02/2014 and EF still 25-30%, increased PA pressures, cardiologist restarted coreg. 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  He has had recent f/u with renal transplant team at [facility] and got updated labs and I reviewed these today -he had them scanned onto his cell phone.  Renal function, PTH, prograf levels, CBC, all stable.  No new questions today.  Pertinent PMH:  Past medical, surgical, social, and family history reviewed and no changes are noted since last office visit.  MEDS:  Outpatient Prescriptions Prior to Visit  Medication Sig Dispense Refill  . allopurinol (ZYLOPRIM) 300 MG tablet Take 150 mg by mouth daily.    [name] aspirin 81 MG tablet Take 1 tablet (81 mg total) by mouth daily. 30 tablet 2  . atorvastatin (LIPITOR) 80 MG tablet Take 1 tablet (80 mg total) by mouth daily. 30 tablet 0  . Cholecalciferol (VITAMIN D3) 5000 UNITS CAPS Take by mouth. 1 tab daily    . clopidogrel (PLAVIX) 75 MG tablet TAKE 1 TABLET (75 MG TOTAL) BY MOUTH DAILY. 90 tablet 1  . esomeprazole (NEXIUM) 40 MG capsule Take 1 capsule (40 mg total) by mouth 2 (two) times daily before a meal. 1 tab po bid 180 capsule 1  . Mycophenolate Mofetil (CELLCEPT PO) Take 250 mg by mouth daily. 3 tabs in the am & 3 tabs in the pm    . nebivolol (BYSTOLIC) 5 MG tablet Take 1 tablet (5 mg total) by mouth daily. 30 tablet 11  . niacin (NIASPAN) 500 MG CR tablet Take 2 tablets (1,000 mg total) by mouth at bedtime. 180 tablet 1  . NITROSTAT 0.4 MG SL  tablet USE 1 TAB UNDER TONGUE EVERY 5 MINUTES UP TO 3 DOSES AS NEEDED 25 tablet 2  . predniSONE (DELTASONE) 5 MG tablet Take 5 mg by mouth daily.    . tacrolimus (PROGRAF) 0.5 MG capsule Take 1 mg (2 pills) at 9 AM and 0.5 mg( 1 pill)  at 9 PM --Z94.0    . valsartan (DIOVAN) 40 MG tablet Take 1 tablet (40 mg total) by mouth daily. 90 tablet 1  . zolpidem (AMBIEN) 5 MG tablet Take 1 tablet (5 mg total) by mouth at bedtime as needed. 30 tablet 5   No facility-administered medications prior to visit.    PE: Blood pressure 137/96, pulse 76, temperature 97.6 F (36.4 C), temperature source Temporal, resp. rate 18, height 5\' 7"  (1.702 m), weight 199 lb (90.266 kg), SpO2 96 %.Repeat bp in right arm, manual cuff was 124/78. Gen: Alert, well appearing.  Patient is oriented to person, place, time, and situation. No further exam today.  LABS:  Lab Results  Component Value Date   CHOL 104 09/03/2013   HDL 57.80 09/03/2013   LDLCALC 30 09/03/2013   TRIG 79.0 09/03/2013   CHOLHDL 2 09/03/2013    IMPRESSION AND PLAN:  1) HTN; The current medical regimen is effective;  continue present plan and medications. Lytes/cr stable via latest blood work at his transplant MD's  office at [facility].  2) Hyperlipidemia: The current medical regimen is effective;  continue present plan and medications. Repeat FLP next f/u 51mo.  3) Hx of renal transplant: stable.  Kidney function stable, doing great on immunosuppressants, has had all appropriate vaccines. He'll continue routine f/u with [facility] renal transplant clinic.  4) Ischemic cardiomyopathy: stable.  Continue current meds. Recent w/u for CP reassuring--noncardiac.    An After Visit Summary was printed and given to the patient.  FOLLOW UP: 4 mo

## 2014-05-03 NOTE — Progress Notes (Signed)
Pre visit review using our clinic review tool, if applicable. No additional management support is needed unless otherwise documented below in the visit note. 

## 2014-05-12 ENCOUNTER — Encounter: Payer: Self-pay | Admitting: Family Medicine

## 2014-05-20 ENCOUNTER — Encounter: Payer: Self-pay | Admitting: Cardiology

## 2014-05-26 ENCOUNTER — Encounter: Payer: Self-pay | Admitting: Internal Medicine

## 2014-06-09 ENCOUNTER — Other Ambulatory Visit: Payer: Self-pay | Admitting: Family Medicine

## 2014-08-02 ENCOUNTER — Telehealth: Payer: Self-pay | Admitting: Cardiology

## 2014-08-02 ENCOUNTER — Encounter: Payer: Commercial Indemnity | Admitting: *Deleted

## 2014-08-02 ENCOUNTER — Telehealth: Payer: Self-pay | Admitting: Internal Medicine

## 2014-08-02 NOTE — Telephone Encounter (Signed)
LMOVM reminding pt to send remote transmission.   

## 2014-08-02 NOTE — Telephone Encounter (Signed)
New message      Returning device clinic call

## 2014-08-02 NOTE — Telephone Encounter (Signed)
Spoke w/ pt and he informed he has tried sending transmission but has been unsuccessful he may have to wait until he receives his wirex around 08-23-14

## 2014-08-17 ENCOUNTER — Other Ambulatory Visit: Payer: Self-pay | Admitting: Family Medicine

## 2014-08-26 ENCOUNTER — Ambulatory Visit (INDEPENDENT_AMBULATORY_CARE_PROVIDER_SITE_OTHER): Payer: Commercial Indemnity | Admitting: *Deleted

## 2014-08-26 DIAGNOSIS — I255 Ischemic cardiomyopathy: Secondary | ICD-10-CM

## 2014-08-27 LAB — MDC_IDC_ENUM_SESS_TYPE_REMOTE
Date Time Interrogation Session: 20160415000223
HIGH POWER IMPEDANCE MEASURED VALUE: 342 Ohm
HIGH POWER IMPEDANCE MEASURED VALUE: 77 Ohm
HighPow Impedance: 61 Ohm
Lead Channel Impedance Value: 399 Ohm
Lead Channel Pacing Threshold Amplitude: 1.25 V
Lead Channel Sensing Intrinsic Amplitude: 14.125 mV
Lead Channel Setting Pacing Pulse Width: 0.4 ms
MDC IDC MSMT BATTERY VOLTAGE: 3.08 V
MDC IDC MSMT LEADCHNL RV PACING THRESHOLD PULSEWIDTH: 0.4 ms
MDC IDC SET LEADCHNL RV PACING AMPLITUDE: 2.5 V
MDC IDC SET LEADCHNL RV SENSING SENSITIVITY: 0.3 mV
MDC IDC SET ZONE DETECTION INTERVAL: 300 ms
MDC IDC SET ZONE DETECTION INTERVAL: 350 ms
MDC IDC SET ZONE DETECTION INTERVAL: 360 ms
MDC IDC STAT BRADY RV PERCENT PACED: 0.13 %

## 2014-08-27 NOTE — Progress Notes (Signed)
Remote ICD transmission.   

## 2014-09-02 ENCOUNTER — Ambulatory Visit (INDEPENDENT_AMBULATORY_CARE_PROVIDER_SITE_OTHER): Payer: Commercial Indemnity | Admitting: Family Medicine

## 2014-09-02 ENCOUNTER — Encounter: Payer: Self-pay | Admitting: Family Medicine

## 2014-09-02 VITALS — BP 115/76 | HR 63 | Temp 97.8°F | Resp 18 | Ht 67.0 in | Wt 203.0 lb

## 2014-09-02 DIAGNOSIS — M329 Systemic lupus erythematosus, unspecified: Secondary | ICD-10-CM | POA: Diagnosis not present

## 2014-09-02 DIAGNOSIS — Z940 Kidney transplant status: Secondary | ICD-10-CM

## 2014-09-02 DIAGNOSIS — I10 Essential (primary) hypertension: Secondary | ICD-10-CM

## 2014-09-02 DIAGNOSIS — K219 Gastro-esophageal reflux disease without esophagitis: Secondary | ICD-10-CM | POA: Diagnosis not present

## 2014-09-02 MED ORDER — PREDNISONE 5 MG PO TABS: 5.0000 mg | ORAL_TABLET | Freq: Every day | ORAL | Status: DC

## 2014-09-02 MED ORDER — NIACIN ER (ANTIHYPERLIPIDEMIC) 500 MG PO TBCR: EXTENDED_RELEASE_TABLET | ORAL | Status: DC

## 2014-09-02 MED ORDER — TACROLIMUS 0.5 MG PO CAPS: ORAL_CAPSULE | ORAL | Status: DC

## 2014-09-02 MED ORDER — MYCOPHENOLATE MOFETIL 250 MG PO CAPS: ORAL_CAPSULE | ORAL | Status: DC

## 2014-09-02 MED ORDER — CLOPIDOGREL BISULFATE 75 MG PO TABS: 75.0000 mg | ORAL_TABLET | Freq: Every day | ORAL | Status: DC

## 2014-09-02 MED ORDER — ALLOPURINOL 300 MG PO TABS: 150.0000 mg | ORAL_TABLET | Freq: Every day | ORAL | Status: DC

## 2014-09-02 MED ORDER — ZOLPIDEM TARTRATE 5 MG PO TABS: 5.0000 mg | ORAL_TABLET | Freq: Every evening | ORAL | Status: DC | PRN

## 2014-09-02 MED ORDER — VITAMIN D3 125 MCG (5000 UT) PO CAPS: ORAL_CAPSULE | ORAL | Status: DC

## 2014-09-02 MED ORDER — NEBIVOLOL HCL 5 MG PO TABS: 5.0000 mg | ORAL_TABLET | Freq: Every day | ORAL | Status: DC

## 2014-09-02 MED ORDER — ESOMEPRAZOLE MAGNESIUM 40 MG PO CPDR: DELAYED_RELEASE_CAPSULE | ORAL | Status: DC

## 2014-09-02 MED ORDER — VALSARTAN 40 MG PO TABS: 40.0000 mg | ORAL_TABLET | Freq: Every day | ORAL | Status: DC

## 2014-09-02 MED ORDER — ATORVASTATIN CALCIUM 80 MG PO TABS: 80.0000 mg | ORAL_TABLET | Freq: Every day | ORAL | Status: DC

## 2014-09-02 NOTE — Progress Notes (Signed)
Pre visit review using our clinic review tool, if applicable. No additional management support is needed unless otherwise documented below in the visit note. 

## 2014-09-02 NOTE — Progress Notes (Signed)
OFFICE VISIT  09/02/2014   CC:  Chief Complaint  Patient presents with  . Follow-up   HPI:    Patient is a 49 y.o. Caucasian male who presents for 4 mo f/u renal transplant, CAD with dilated ischemic CM, hyperlipidemia, HTN, and SLE that has been quiescent. No complaints, says he feels fine. Compliant with meds, asks me to do RFs of all his chronic rx meds for 90 day supply with 4 additional RF's.  He had his labs save on his i-phone from recent check at Dr. Shelva Majestic office 08/02/14.   CBC, LFP, uric acid all normal.  CMET normal except Cr 1.6 (GFR 51 ml/min). He says no changes were made in meds/plans.  He is currently on a schedule of seeing his renal transplant team at [facility] and Dr. Clover Mealy q28mo.   Past Medical History  Diagnosis Date  . Lupus   . CAD (coronary artery disease)      stents RCA/Circ 2001, BMS to LAD 07/2010  . Cardiomyopathy     Ischemic:  02/2011 EF 30-35%.   Single chamber ICD 11/20/10 ([name])  . Gout     s/p renal transplant he was weened off of his allopurinol.  . Avascular necrosis of bone of hip 2010 surg    Left hip arthroplasty: from chronic systemic steroids taken for his Lupus  . Left ventricular thrombus 2012    Re-eval 02/2011 showed thrombus RESOLVED, so coumadin was d/c'd (was on it for 20mo)  . implantable cardiac defibrillator single chamber     Medtronic (due to low EF)  . Hypertension   . Myocardial infarction 2001 and 2012    3 stents and defib placed in July  . GERD (gastroesophageal reflux disease)     Hx of esoph stricture and dilatation  . Hiatal hernia   . End stage renal disease 04/24/2011    Secondary to SLE: HD 06/2011-10/2012 (then got renal transplant)  . History of renal transplant 11/10/2012    10/2012 [facility]     Past Surgical History  Procedure Laterality Date  . Stents      05-2010 and 2- in 2010  . Partial hip arthroplasty      left  . Renal biopsy    . Tympanostomy tube placement  49 yrs old  . Av  fistula placement  03/28/2011    Procedure: ARTERIOVENOUS (AV) FISTULA CREATION;  Surgeon: Rosetta Posner, MD;  Location: [place];  Service: Vascular;  Laterality: Left;  Creation of Left radiocephallic cimino fistula  . Cardiac catheterization      08/2010  . US echocardiography  12/2010; 02/2014    02/2014 EF still 25-30%, increased PA pressures.  . Cardiovascular stress test  07/2010; 03/2014    03/2014 showed large old infarct and EF 30-35% but no ischemia  . Av fistula placement  04/27/2011    Procedure: ARTERIOVENOUS (AV) FISTULA CREATION;  Surgeon: Rosetta Posner, MD;  Location: [place];  Service: Vascular;  Laterality: Left;  left basilic vein transposition  . Insert / replace / remove pacemaker      medtronic        dr [name]    [place]   icd only  . Total hip arthroplasty  07/09/2011    Procedure: TOTAL HIP ARTHROPLASTY;  Surgeon: Kerin Salen, MD;  Location: [place];  Service: Orthopedics;  Laterality: Right;  . Kidney transplant  10/2012    [facility] nephrologist--Dr. Blair Heys  . Esophagogastroduodenoscopy  02/2009  Done by Dr. Fuller Plan for hematemesis; esophagitis found    Outpatient Prescriptions Prior to Visit  Medication Sig Dispense Refill  . aspirin 81 MG tablet Take 1 tablet (81 mg total) by mouth daily. 30 tablet 2  . magnesium oxide (MAG-OX) 400 MG tablet Take 400 mg by mouth 2 (two) times daily.    [name] NITROSTAT 0.4 MG SL tablet USE 1 TAB UNDER TONGUE EVERY 5 MINUTES UP TO 3 DOSES AS NEEDED 25 tablet 2  . allopurinol (ZYLOPRIM) 300 MG tablet Take 150 mg by mouth daily.    [name] allopurinol (ZYLOPRIM) 300 MG tablet TAKE 1 TABLET BY MOUTH DAILY 90 tablet 1  . atorvastatin (LIPITOR) 80 MG tablet Take 1 tablet (80 mg total) by mouth daily. 30 tablet 0  . atorvastatin (LIPITOR) 80 MG tablet TAKE 1 TABLET BY MOUTH DAILY 90 tablet 1  . Cholecalciferol (VITAMIN D3) 5000 UNITS CAPS Take by mouth. 1 tab daily    . clopidogrel (PLAVIX) 75 MG tablet TAKE 1 TABLET BY MOUTH DAILY 90 tablet 1  .  esomeprazole (NEXIUM) 40 MG capsule TAKE 1 CAPSULE BY MOUTH TWICE DAILY BEFORE MEALS 180 capsule 1  . Mycophenolate Mofetil (CELLCEPT PO) Take 250 mg by mouth daily. 3 tabs in the am & 3 tabs in the pm    . nebivolol (BYSTOLIC) 5 MG tablet Take 1 tablet (5 mg total) by mouth daily. 30 tablet 11  . niacin (NIASPAN) 500 MG CR tablet TAKE 2 TABLETS BY MOUTH (1000MG ) AT BEDTIME 180 tablet 1  . predniSONE (DELTASONE) 5 MG tablet Take 5 mg by mouth daily.    . tacrolimus (PROGRAF) 0.5 MG capsule Take 1 mg (2 pills) at 9 AM and 0.5 mg( 1 pill)  at 9 PM --Z94.0    . valsartan (DIOVAN) 40 MG tablet TAKE 1 TABLET BY MOUTH DAILY 90 tablet 1  . zolpidem (AMBIEN) 5 MG tablet Take 1 tablet (5 mg total) by mouth at bedtime as needed. 30 tablet 5   No facility-administered medications prior to visit.    No Known Allergies  ROS As per HPI  PE: Blood pressure 115/76, pulse 63, temperature 97.8 F (36.6 C), temperature source Temporal, resp. rate 18, height 5\' 7"  (1.702 m), weight 203 lb (92.08 kg), SpO2 97 %. Gen: Alert, well appearing.  Patient is oriented to person, place, time, and situation. AFFECT: pleasant, lucid thought and speech. No further exam today.  LABS:  None today  IMPRESSION AND PLAN:  HTN, hyperlipidemia, CAD w/dilated ischemic CM, renal transplant pt with stable CRI, hx of lupus: all stable, doing well, med rF's done today.  No vaccines or lab testing needed at this time.  An After Visit Summary was printed and given to the patient.  FOLLOW UP: Return in about 6 months (around 03/04/2015) for routine chronic illness f/u (30 min).

## 2014-09-03 NOTE — Progress Notes (Signed)
Patient ID: Roy Dyer, male   DOB: [birth year], 49 y.o.   MRN: [id number] Mr. Roy Dyer is a 49 y.o. male with a history of coronary artery disease who suffered anterior MI in March of 2012. He underwent bare metal stenting to his LAD at that time. Had previous stents to circ and RCA in 2001 At discharge, he was placed on a LifeVest because of depressed LV function. The patient was maintained on optimal medical therapy. His ejection fraction was reassessed 90 days post revascularization and was found to still be less than 35%. Had BiV AICD placed by Dr [name] 11/20/10. DFT;s not tested due to history of large mural apical thrombus. Repeat MRI done 11/17/10 showed resolution of thrombus and EF 28% Coumadin stopped   Had hip replaced in 3/12 and had worse renal failure and ended up with renal transplant at [facility] them yearly and Dr Clover Mealy every 4 months  Cr 1.6   Niece was donor.  Still with fistula in LUE On 3 drugs for rejection    AICD with no shocks and normal device interrogation   Echo 02/26/14 Study Conclusions  - Left ventricle: The cavity size was moderately dilated. Wall thickness was normal. Systolic function was severely reduced. The estimated ejection fraction was in the range of 25% to 30%. Diffuse hypokinesis. Dyskinesis of the apical myocardium. Due to first degree atrioventricular block, there was fusion of early and atrial contributions to ventricular filling. No evidence of thrombus. - Mitral valve: There was mild to moderate regurgitation directed centrally. - Left atrium: The atrium was severely dilated. - Tricuspid valve: There was moderate regurgitation. - Pulmonary arteries: Systolic pressure was severely increased. PA peak pressure: 79 mm Hg (S).   Started on bystolic for relative tachycardia   04/02/14  Atypical chest pain Myovue reviewed and EF 35% large anterior infarct no ischemia   Wife in hospital with bad infection spleen after coiling for  aneurysm by Dr Trula Slade   ROS: Denies fever, malais, weight loss, blurry vision, decreased visual acuity, cough, sputum, SOB, hemoptysis, pleuritic pain, palpitaitons, heartburn, abdominal pain, melena, lower extremity edema, claudication, or rash.  All other systems reviewed and negative  General: Affect appropriate Healthy:  appears stated age 22: normal Neck supple with no adenopathy JVP normal no bruits no thyromegaly Lungs clear with no wheezing and good diaphragmatic motion Heart:  S1/S2 SEM  murmur, no rub, gallop or click AICD under right clavcle  PMI normal Abdomen: benighn, BS positve, striae on sides transplanted kidney  no bruit.  No HSM or HJR Distal pulses intact fistula /thrill LUE  No edema Neuro non-focal Skin warm and dry No muscular weakness   Current Outpatient Prescriptions  Medication Sig Dispense Refill  . allopurinol (ZYLOPRIM) 300 MG tablet Take 0.5 tablets (150 mg total) by mouth daily. 45 tablet 4  . aspirin 81 MG tablet Take 1 tablet (81 mg total) by mouth daily. 30 tablet 2  . atorvastatin (LIPITOR) 80 MG tablet Take 1 tablet (80 mg total) by mouth daily. 90 tablet 4  . Cholecalciferol (VITAMIN D3) 5000 UNITS CAPS 1 tab daily 90 capsule 4  . clopidogrel (PLAVIX) 75 MG tablet Take 1 tablet (75 mg total) by mouth daily. 90 tablet 4  . esomeprazole (NEXIUM) 40 MG capsule TAKE 1 CAPSULE BY MOUTH TWICE DAILY BEFORE MEALS 180 capsule 4  . magnesium oxide (MAG-OX) 400 MG tablet Take 400 mg by mouth 2 (two) times daily.    . mycophenolate (CELLCEPT) 250  MG capsule 3 tabs in the am & 3 tabs in the pm 540 capsule 4  . nebivolol (BYSTOLIC) 5 MG tablet Take 1 tablet (5 mg total) by mouth daily. 90 tablet 4  . niacin (NIASPAN) 500 MG CR tablet TAKE 2 TABLETS BY MOUTH (1000MG ) AT BEDTIME 180 tablet 4  . NITROSTAT 0.4 MG SL tablet USE 1 TAB UNDER TONGUE EVERY 5 MINUTES UP TO 3 DOSES AS NEEDED 25 tablet 2  . predniSONE (DELTASONE) 5 MG tablet Take 1 tablet (5 mg  total) by mouth daily. 90 tablet 4  . tacrolimus (PROGRAF) 0.5 MG capsule Take 1 mg (2 pills) at 9 AM and 0.5 mg( 1 pill)  at 9 PM --Z94.0 270 capsule 4  . valsartan (DIOVAN) 40 MG tablet Take 1 tablet (40 mg total) by mouth daily. 90 tablet 4  . zolpidem (AMBIEN) 5 MG tablet Take 1 tablet (5 mg total) by mouth at bedtime as needed. 90 tablet 1  . [DISCONTINUED] calcium carbonate, dosed in mg elemental calcium, 1250 MG/5ML Take 5 mLs (500 mg of elemental calcium total) by mouth every 6 (six) hours as needed. 450 mL 2  . [DISCONTINUED] dexlansoprazole (DEXILANT) 60 MG capsule Take 1 capsule (60 mg total) by mouth daily. 30 capsule 2   No current facility-administered medications for this visit.    Allergies  Review of patient's allergies indicates no known allergies.  Electrocardiogram:  02/26/14   SR rate 92 LVH biatrial enlargement anterior MI  LAD   Assessment and Plan

## 2014-09-06 ENCOUNTER — Other Ambulatory Visit: Payer: Self-pay

## 2014-09-06 ENCOUNTER — Ambulatory Visit (INDEPENDENT_AMBULATORY_CARE_PROVIDER_SITE_OTHER): Payer: Commercial Indemnity | Admitting: Cardiovascular Disease

## 2014-09-06 ENCOUNTER — Encounter: Payer: Self-pay | Admitting: Cardiovascular Disease

## 2014-09-06 ENCOUNTER — Ambulatory Visit (HOSPITAL_COMMUNITY): Payer: Commercial Indemnity | Attending: Cardiology | Admitting: Radiology

## 2014-09-06 VITALS — BP 116/62 | HR 68 | Ht 67.0 in | Wt 202.0 lb

## 2014-09-06 DIAGNOSIS — I255 Ischemic cardiomyopathy: Secondary | ICD-10-CM

## 2014-09-06 DIAGNOSIS — I2583 Coronary atherosclerosis due to lipid rich plaque: Secondary | ICD-10-CM

## 2014-09-06 DIAGNOSIS — I10 Essential (primary) hypertension: Secondary | ICD-10-CM | POA: Diagnosis not present

## 2014-09-06 DIAGNOSIS — I2510 Atherosclerotic heart disease of native coronary artery without angina pectoris: Secondary | ICD-10-CM | POA: Diagnosis not present

## 2014-09-06 DIAGNOSIS — N186 End stage renal disease: Secondary | ICD-10-CM | POA: Diagnosis not present

## 2014-09-06 NOTE — Assessment & Plan Note (Signed)
Reviewed echo from today and EF same 30-35% with no apical thrombus  Moderate TR but PA pressure estimate only 45 mmHg Better than last time.  He indicates some reflux but I told him any degree of pulmonary hypertension more likely from ischemic DCM And AICD wire across TV

## 2014-09-06 NOTE — Progress Notes (Signed)
Echocardiogram performed.  

## 2014-09-06 NOTE — Assessment & Plan Note (Signed)
Post transplant baseline Cr about 1.6 on 3 drug anti rejection meds no plans on weaning and no plans on tying off fistula in LUE F/U [facility] yearly and Dr Allena Napoleon yearly

## 2014-09-06 NOTE — Assessment & Plan Note (Signed)
Well controlled.  Continue current medications and low sodium Dash type diet.    

## 2014-09-06 NOTE — Assessment & Plan Note (Signed)
Stable with no angina and good activity level.  Continue medical Rx Myovue 2015 infarct no ischemia

## 2014-09-06 NOTE — Patient Instructions (Signed)
Medication Instructions:  No changes today.  Labwork: None today.  Testing/Procedures: You have already had an echocardiogram done today.  Follow-Up: Your physician wants you to follow-up in: 6 months with Dr Johnsie Cancel. (October 2016).  You will receive a reminder letter in the mail two months in advance. If you don't receive a letter, please call our office to schedule the follow-up appointment.

## 2014-09-21 ENCOUNTER — Encounter: Payer: Self-pay | Admitting: Cardiology

## 2014-09-23 ENCOUNTER — Encounter: Payer: Self-pay | Admitting: Internal Medicine

## 2014-11-12 ENCOUNTER — Encounter: Payer: Self-pay | Admitting: *Deleted

## 2014-12-30 ENCOUNTER — Encounter: Payer: Self-pay | Admitting: Internal Medicine

## 2014-12-30 ENCOUNTER — Ambulatory Visit (INDEPENDENT_AMBULATORY_CARE_PROVIDER_SITE_OTHER): Payer: Commercial Indemnity | Admitting: Internal Medicine

## 2014-12-30 VITALS — BP 119/78 | HR 65 | Ht 67.0 in | Wt 203.6 lb

## 2014-12-30 DIAGNOSIS — I255 Ischemic cardiomyopathy: Secondary | ICD-10-CM

## 2014-12-30 DIAGNOSIS — Z4502 Encounter for adjustment and management of automatic implantable cardiac defibrillator: Secondary | ICD-10-CM

## 2014-12-30 DIAGNOSIS — I4729 Other ventricular tachycardia: Secondary | ICD-10-CM

## 2014-12-30 DIAGNOSIS — I472 Ventricular tachycardia: Secondary | ICD-10-CM | POA: Diagnosis not present

## 2014-12-30 NOTE — Patient Instructions (Addendum)
Medication Instructions:  Your physician recommends that you continue on your current medications as directed. Please refer to the Current Medication list given to you today.  Labwork: None ordered  Testing/Procedures: None ordered  Follow-Up: Remote monitoring is used to monitor your ICD from home. This monitoring reduces the number of office visits required to check your device to one time per year. It allows [place] to keep an eye on the functioning of your device to ensure it is working properly. You are scheduled for a device check from home on 03-31-2015. You may send your transmission at any time that day. If you have a wireless device, the transmission will be sent automatically. After your physician reviews your transmission, you will receive a postcard with your next transmission date.  Your physician recommends that you schedule a follow-up appointment in: 12 months with Dr.Klein  Any Other Special Instructions Will Be Listed Below (If Applicable). Thank you for choosing [facility]!!

## 2014-12-30 NOTE — Progress Notes (Signed)
Patient Care Team: [name], MD as PCP - General (Family Medicine) Josue Hector, MD as Consulting Physician (Cardiology) Deboraha Sprang, MD as Consulting Physician (Cardiology) Corliss Parish, MD as Consulting Physician (Nephrology) Rosetta Posner, MD as Consulting Physician (Vascular Surgery) Lowella Bandy, MD as Consulting Physician (Urology)   HPI  Roy Dyer is a 49 y.o. male Seen in followup for ICD implanted for primary prevention. He has primarily an ischemic myopathy status post LAD occlusion and bare-metal stenti with stentng of his LAD with prior stenting of the circumflex and RCA.  MRI 7/12 demonstrated EF 20%   Echocardiogram 3/13 EF 35% LVH.   His renal transplant is doing beautifully. Creatinine is 1.3  The patient denies chest pain, shortness of breath, nocturnal dyspnea, orthopnea or peripheral edema.  There have been no palpitations, lightheadedness or syncope.       Past Medical History  Diagnosis Date  . Lupus   . CAD (coronary artery disease)      stents RCA/Circ 2001, BMS to LAD 07/2010  . Cardiomyopathy     Ischemic:  02/2011 EF 30-35%.   Single chamber ICD 11/20/10 ([name])  . Gout     s/p renal transplant he was weened off of his allopurinol.  . Avascular necrosis of bone of hip 2010 surg    Left hip arthroplasty: from chronic systemic steroids taken for his Lupus  . Left ventricular thrombus 2012    Re-eval 02/2011 showed thrombus RESOLVED, so coumadin was d/c'd (was on it for 48mo)  . implantable cardiac defibrillator single chamber     Medtronic (due to low EF)  . Hypertension   . Myocardial infarction 2001 and 2012    3 stents and defib placed in July  . GERD (gastroesophageal reflux disease)     Hx of esoph stricture and dilatation  . Hiatal hernia   . End stage renal disease 04/24/2011    Secondary to SLE: HD 06/2011-10/2012 (then got renal transplant)  . History of renal transplant 11/10/2012    10/2012 [facility]     Past  Surgical History  Procedure Laterality Date  . Stents      05-2010 and 2- in 2010  . Partial hip arthroplasty      left  . Renal biopsy    . Tympanostomy tube placement  49 yrs old  . Av fistula placement  03/28/2011    Procedure: ARTERIOVENOUS (AV) FISTULA CREATION;  Surgeon: Rosetta Posner, MD;  Location: [place];  Service: Vascular;  Laterality: Left;  Creation of Left radiocephallic cimino fistula  . Cardiac catheterization      08/2010  . US echocardiography  12/2010; 02/2014    02/2014 EF still 25-30%, increased PA pressures.  . Cardiovascular stress test  07/2010; 03/2014    03/2014 showed large old infarct and EF 30-35% but no ischemia  . Av fistula placement  04/27/2011    Procedure: ARTERIOVENOUS (AV) FISTULA CREATION;  Surgeon: Rosetta Posner, MD;  Location: [place];  Service: Vascular;  Laterality: Left;  left basilic vein transposition  . Insert / replace / remove pacemaker      medtronic        dr [name]    [place]   icd only  . Total hip arthroplasty  07/09/2011    Procedure: TOTAL HIP ARTHROPLASTY;  Surgeon: Kerin Salen, MD;  Location: [place];  Service: Orthopedics;  Laterality: Right;  . Kidney transplant  10/2012  [place] nephrologist--Dr. Blair Heys  . Esophagogastroduodenoscopy  02/2009    Done by Dr. Fuller Plan for hematemesis; esophagitis found    Current Outpatient Prescriptions  Medication Sig Dispense Refill  . allopurinol (ZYLOPRIM) 300 MG tablet Take 0.5 tablets (150 mg total) by mouth daily. 45 tablet 4  . aspirin 81 MG tablet Take 1 tablet (81 mg total) by mouth daily. 30 tablet 2  . atorvastatin (LIPITOR) 80 MG tablet Take 1 tablet (80 mg total) by mouth daily. 90 tablet 4  . clopidogrel (PLAVIX) 75 MG tablet Take 1 tablet (75 mg total) by mouth daily. 90 tablet 4  . esomeprazole (NEXIUM) 40 MG capsule TAKE 1 CAPSULE BY MOUTH TWICE DAILY BEFORE MEALS 180 capsule 4  . magnesium oxide (MAG-OX) 400 MG tablet Take 400 mg by mouth 2 (two) times daily.    .  mycophenolate (CELLCEPT) 250 MG capsule 3 tabs in the am & 3 tabs in the pm 540 capsule 4  . nebivolol (BYSTOLIC) 5 MG tablet Take 1 tablet (5 mg total) by mouth daily. 90 tablet 4  . niacin (NIASPAN) 500 MG CR tablet TAKE 2 TABLETS BY MOUTH (1000MG ) AT BEDTIME 180 tablet 4  . NITROSTAT 0.4 MG SL tablet USE 1 TAB UNDER TONGUE EVERY 5 MINUTES UP TO 3 DOSES AS NEEDED 25 tablet 2  . predniSONE (DELTASONE) 5 MG tablet Take 1 tablet (5 mg total) by mouth daily. 90 tablet 4  . tacrolimus (PROGRAF) 0.5 MG capsule Take 1 mg (2 pills) at 9 AM and 0.5 mg( 1 pill)  at 9 PM --Z94.0 (Patient taking differently: Take by mouth. Take 1 mg (2 pills) at 9 AM and 0.5 mg( 1 pill)  at 9 PM --Z94.0) 270 capsule 4  . valsartan (DIOVAN) 40 MG tablet Take 1 tablet (40 mg total) by mouth daily. 90 tablet 4  . Vitamin D, Ergocalciferol, (DRISDOL) 50000 UNITS CAPS capsule Take 50,000 Units by mouth every 7 (seven) days.    [name] zolpidem (AMBIEN) 5 MG tablet Take 1 tablet (5 mg total) by mouth at bedtime as needed. 90 tablet 1  . [DISCONTINUED] calcium carbonate, dosed in mg elemental calcium, 1250 MG/5ML Take 5 mLs (500 mg of elemental calcium total) by mouth every 6 (six) hours as needed. 450 mL 2  . [DISCONTINUED] dexlansoprazole (DEXILANT) 60 MG capsule Take 1 capsule (60 mg total) by mouth daily. 30 capsule 2   No current facility-administered medications for this visit.    No Known Allergies  Review of Systems negative except from HPI and PMH  Physical Exam BP 119/78 mmHg  Pulse 65  Ht 5\' 7"  (1.702 m)  Wt 203 lb 9.6 oz (92.352 kg)  BMI 31.88 kg/m2 Well developed and well nourished in no acute distress HENT normal E scleral and icterus clear Neck Supple Clear to ausculation Device pocket well healed; without hematoma or erythema.  There is no tethering   Regular rate and rhythm, no murmurs gallops or rub Soft with active bowel sounds No clubbing cyanosis  Edema Alert and oriented, grossly normal motor and  sensory function Skin Warm and clammy  ECG demonstrates sinus rhythm at 65 intervals 22/10/40 Axis left -27 Prior inferolateral MI Prior inferolateral and  Assessment and  Plan  Ischemic cardiomyopathy  Implantable defibrillator Medtronic The patient's device was interrogated.  The information was reviewed. No changes were made in the programming.    Nonsustained ventricular tachycardia  No intercurrent Ventricular tachycardia   Renal transplantation  He is  doing exceptionally well following his transplant. He is euvolemic.   He is discussed with Dr. Mariana Single niacin and Plavix. It is his recommendation that they both be continued

## 2015-01-31 ENCOUNTER — Other Ambulatory Visit: Payer: Self-pay | Admitting: Family Medicine

## 2015-02-01 NOTE — Telephone Encounter (Signed)
LOV: 09/02/14 NOV: 03/04/15  RF request for clopidogrel Last written: 09/02/14 #90 w/ 4RF  RF request for atorvastatin Last written: 09/02/14 #90 w/ 4RF  RF request for niacin Last written: 09/02/14 #180 w/ 4RF

## 2015-02-02 ENCOUNTER — Other Ambulatory Visit: Payer: Self-pay | Admitting: *Deleted

## 2015-02-02 MED ORDER — VALSARTAN 40 MG PO TABS: 40.0000 mg | ORAL_TABLET | Freq: Every day | ORAL | Status: DC

## 2015-02-02 NOTE — Telephone Encounter (Signed)
Pt LMOM on 02/01/15 at 4:13pm   RFrequest for valsartain LOV: 09/02/14 Next ov: 03/04/15 Last written: 09/02/14 #90 w/ 4RF

## 2015-03-04 ENCOUNTER — Ambulatory Visit: Payer: Commercial Indemnity | Admitting: Family Medicine

## 2015-03-14 ENCOUNTER — Encounter: Payer: Self-pay | Admitting: Gastroenterology

## 2015-03-14 ENCOUNTER — Encounter: Payer: Self-pay | Admitting: Family Medicine

## 2015-03-14 ENCOUNTER — Ambulatory Visit (INDEPENDENT_AMBULATORY_CARE_PROVIDER_SITE_OTHER): Payer: Commercial Indemnity | Admitting: Family Medicine

## 2015-03-14 VITALS — BP 120/82 | HR 72 | Temp 98.4°F | Resp 16 | Ht 67.0 in | Wt 203.0 lb

## 2015-03-14 DIAGNOSIS — Z1283 Encounter for screening for malignant neoplasm of skin: Secondary | ICD-10-CM

## 2015-03-14 DIAGNOSIS — I255 Ischemic cardiomyopathy: Secondary | ICD-10-CM

## 2015-03-14 DIAGNOSIS — Z940 Kidney transplant status: Secondary | ICD-10-CM

## 2015-03-14 DIAGNOSIS — M329 Systemic lupus erythematosus, unspecified: Secondary | ICD-10-CM | POA: Diagnosis not present

## 2015-03-14 DIAGNOSIS — K219 Gastro-esophageal reflux disease without esophagitis: Secondary | ICD-10-CM | POA: Diagnosis not present

## 2015-03-14 DIAGNOSIS — E785 Hyperlipidemia, unspecified: Secondary | ICD-10-CM

## 2015-03-14 DIAGNOSIS — I10 Essential (primary) hypertension: Secondary | ICD-10-CM

## 2015-03-14 NOTE — Progress Notes (Signed)
OFFICE VISIT  03/14/2015   CC:  Chief Complaint  Patient presents with  . Follow-up    Pt is not fasting.      HPI:    Patient is a 49 y.o. Caucasian male who presents for 6 mo f/u lupus, hx of renal transplant, HTN, and GERD. He saw his general cardiologist 08/2014 and electrophys cardiologist 12/2014 and all was stable, no med changes made.  Nephrologist 02/02/2015: got flu vaccine, Cr 1.29 per pt, tacrolimus level was a bit low so they increased his dose to 1mg  qAM and 0.5 mg qhs.  Says he is feeling good, denies prob w/any meds.   As long as he takes nexium daily, he has no GERD.  Trial of switch to just zantac resulted in intolerable GER resurgence. Last/only EGD was 2010.   Pt is open to return to GI for consideration of screening EGD for Barretts esoph, esp since he is on immunosuppressants.   Past Medical History  Diagnosis Date  . Lupus ([place])   . CAD (coronary artery disease)      stents RCA/Circ 2001, BMS to LAD 07/2010  . Cardiomyopathy     Ischemic:  02/2011 EF 30-35%.   Single chamber ICD 11/20/10 ([name])  . Gout     s/p renal transplant he was weened off of his allopurinol.  . Avascular necrosis of bone of hip ([place]) 2010 surg    Left hip arthroplasty: from chronic systemic steroids taken for his Lupus  . 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 implantable cardiac defibrillator single chamber     Medtronic (due to low EF)  . Hypertension   . Myocardial infarction [facility]) 2001 and 2012    3 stents and defib placed in July  . GERD (gastroesophageal reflux disease)     Hx of esoph stricture and dilatation  . Hiatal hernia   . End stage renal disease ([place]) 04/24/2011    Secondary to SLE: HD 06/2011-10/2012 (then got renal transplant)  . History of renal transplant 11/10/2012    10/2012 [facility]     Past Surgical History  Procedure Laterality Date  . Stents      05-2010 and 2- in  2010  . Partial hip arthroplasty      left  . Renal biopsy    . Tympanostomy tube placement  49 yrs old  . Av fistula placement  03/28/2011    Procedure: ARTERIOVENOUS (AV) FISTULA CREATION;  Surgeon: Rosetta Posner, MD;  Location: [place];  Service: Vascular;  Laterality: Left;  Creation of Left radiocephallic cimino fistula  . Cardiac catheterization      08/2010  . US echocardiography  12/2010; 02/2014;08/2014    02/2014 EF still 25-30%, increased PA pressures.  2016 EF 30-35%, sept/inf hypokinesis, mod tricusp regurg  . Cardiovascular stress test  07/2010; 03/2014    03/2014 showed large old infarct and EF 30-35% but no ischemia  . Av fistula placement  04/27/2011    Procedure: ARTERIOVENOUS (AV) FISTULA CREATION;  Surgeon: Rosetta Posner, MD;  Location: [place];  Service: Vascular;  Laterality: Left;  left basilic vein transposition  . Insert / replace / remove pacemaker      medtronic        dr [name]    [place]   icd only  . Total hip arthroplasty  07/09/2011    Procedure: TOTAL HIP ARTHROPLASTY;  Surgeon:  Kerin Salen, MD;  Location: [place];  Service: Orthopedics;  Laterality: Right;  . Kidney transplant  10/2012    [facility] nephrologist--Dr. Blair Heys  . Esophagogastroduodenoscopy  02/2009    Done by Dr. Fuller Plan for hematemesis; esophagitis found    Outpatient Prescriptions Prior to Visit  Medication Sig Dispense Refill  . allopurinol (ZYLOPRIM) 300 MG tablet Take 0.5 tablets (150 mg total) by mouth daily. 45 tablet 4  . aspirin 81 MG tablet Take 1 tablet (81 mg total) by mouth daily. 30 tablet 2  . atorvastatin (LIPITOR) 80 MG tablet Take 1 tablet (80 mg total) by mouth daily. 90 tablet 4  . clopidogrel (PLAVIX) 75 MG tablet Take 1 tablet (75 mg total) by mouth daily. 90 tablet 4  . esomeprazole (NEXIUM) 40 MG capsule TAKE 1 CAPSULE BY MOUTH TWICE DAILY BEFORE MEALS 180 capsule 4  . magnesium oxide (MAG-OX) 400 MG tablet Take 400 mg by mouth 2 (two) times daily.    . mycophenolate  (CELLCEPT) 250 MG capsule 3 tabs in the am & 3 tabs in the pm 540 capsule 4  . nebivolol (BYSTOLIC) 5 MG tablet Take 1 tablet (5 mg total) by mouth daily. 90 tablet 4  . niacin (NIASPAN) 500 MG CR tablet TAKE 2 TABLETS BY MOUTH (1000MG ) AT BEDTIME 180 tablet 4  . NITROSTAT 0.4 MG SL tablet USE 1 TAB UNDER TONGUE EVERY 5 MINUTES UP TO 3 DOSES AS NEEDED 25 tablet 2  . predniSONE (DELTASONE) 5 MG tablet Take 1 tablet (5 mg total) by mouth daily. 90 tablet 4  . tacrolimus (PROGRAF) 0.5 MG capsule Take 1 mg (2 pills) at 9 AM and 0.5 mg( 1 pill)  at 9 PM --Z94.0 (Patient taking differently: Take by mouth. Take 1 mg (2 pills) at 9 AM and 0.5 mg( 1 pill)  at 9 PM --Z94.0) 270 capsule 4  . valsartan (DIOVAN) 40 MG tablet Take 1 tablet (40 mg total) by mouth daily. 90 tablet 3  . Vitamin D, Ergocalciferol, (DRISDOL) 50000 UNITS CAPS capsule Take 50,000 Units by mouth every 7 (seven) days.    [name] zolpidem (AMBIEN) 5 MG tablet Take 1 tablet (5 mg total) by mouth at bedtime as needed. 90 tablet 1  . atorvastatin (LIPITOR) 80 MG tablet TAKE 1 TABLET BY MOUTH DAILY (Patient not taking: Reported on 03/14/2015) 90 tablet 1  . clopidogrel (PLAVIX) 75 MG tablet TAKE 1 TABLET BY MOUTH DAILY (Patient not taking: Reported on 03/14/2015) 90 tablet 1  . niacin (NIASPAN) 500 MG CR tablet TAKE 2 TABLETS BY MOUTH AT BEDTIME (Patient not taking: Reported on 03/14/2015) 180 tablet 1   No facility-administered medications prior to visit.    No Known Allergies  ROS As per HPI  PE: Blood pressure 120/82, pulse 72, temperature 98.4 F (36.9 C), temperature source Oral, resp. rate 16, height 5\' 7"  (1.702 m), weight 203 lb (92.08 kg), SpO2 95 %. Gen: Alert, well appearing.  Patient is oriented to person, place, time, and situation. CV: RRR, no m/r/g.   LUNGS: CTA bilat, nonlabored resps, good aeration in all lung fields. EXT: no clubbing, cyanosis, or edema.    LABS:  Lab Results  Component Value Date   CHOL 104  09/03/2013   HDL 57.80 09/03/2013   LDLCALC 30 09/03/2013   TRIG 79.0 09/03/2013   CHOLHDL 2 09/03/2013    IMPRESSION AND PLAN:  1) Lupus; quiescent.  2) S/p renal transplant, kidney function stable, continue renal f/u  locally and with transplant group at [facility].  3) Hyperlipidemia: can't find that any providers have followed his lipids since 08/2013. He'll return for lab visit when fasting to get FLP.  4) Skin ca screening: recommended by his [facility] specialists since he is on immunosuppressants.  5) GERD: will get him back in with GI for consideration of screening EGD for Barrett's esophagus since he has chronic sx's requiring daily PPI for control AND is on immunosuppressant meds daily.  6) CAD w/ischemic cardiomyopathy: all stable, continue current med regimen and specialist f/u.  An After Visit Summary was printed and given to the patient.  FOLLOW UP: Return in about 6 months (around 09/11/2015) for routine chronic illness f/u (30 min).

## 2015-03-14 NOTE — Progress Notes (Signed)
Pre visit review using our clinic review tool, if applicable. No additional management support is needed unless otherwise documented below in the visit note. 

## 2015-03-21 NOTE — Progress Notes (Signed)
Patient ID: Roy Dyer, male   DOB: [birth year], 49 y.o.   MRN: [id number] Roy Dyer is a 49 y.o. male with a history of coronary artery disease who suffered anterior MI in March of 2012. He underwent bare metal stenting to his LAD at that time. Had previous stents to circ and RCA in 2001 At discharge, he was placed on a LifeVest because of depressed LV function. The patient was maintained on optimal medical therapy. His ejection fraction was reassessed 90 days post revascularization and was found to still be less than 35%. Had BiV AICD placed by Dr [name] 11/20/10. DFT;s not tested due to history of large mural apical thrombus. Repeat MRI done 11/17/10 showed resolution of thrombus and EF 28% Coumadin stopped   Had hip replaced in 3/12 and had worse renal failure and ended up with renal transplant at [facility] them yearly and Dr Clover Mealy every 4 months  Cr 1.6   Niece was donor.  Still with fistula in LUE On 3 drugs for rejection    AICD with no shocks and normal device interrogation   Echo 02/26/14 Study Conclusions  - Left ventricle: The cavity size was moderately dilated. Wall thickness was normal. Systolic function was severely reduced. The estimated ejection fraction was in the range of 25% to 30%. Diffuse hypokinesis. Dyskinesis of the apical myocardium. Due to first degree atrioventricular block, there was fusion of early and atrial contributions to ventricular filling. No evidence of thrombus. - Mitral valve: There was mild to moderate regurgitation directed centrally. - Left atrium: The atrium was severely dilated. - Tricuspid valve: There was moderate regurgitation. - Pulmonary arteries: Systolic pressure was severely increased. PA peak pressure: 79 mm Hg (S).   Started on bystolic for relative tachycardia   04/02/14  Atypical chest pain Myovue reviewed and EF 35% large anterior infarct no ischemia   Wife in hospital with bad infection spleen after coiling for  aneurysm by Dr Trula Slade   ROS: Denies fever, malais, weight loss, blurry vision, decreased visual acuity, cough, sputum, SOB, hemoptysis, pleuritic pain, palpitaitons, heartburn, abdominal pain, melena, lower extremity edema, claudication, or rash.  All other systems reviewed and negative  General: Affect appropriate Healthy:  appears stated age 86: normal Neck supple with no adenopathy JVP normal no bruits no thyromegaly Lungs clear with no wheezing and good diaphragmatic motion Heart:  S1/S2 SEM  murmur, no rub, gallop or click AICD under right clavcle  PMI normal Abdomen: benighn, BS positve, striae on sides transplanted kidney  no bruit.  No HSM or HJR Distal pulses intact fistula /thrill LUE  No edema Neuro non-focal Skin warm and dry No muscular weakness   Current Outpatient Prescriptions  Medication Sig Dispense Refill  . allopurinol (ZYLOPRIM) 300 MG tablet Take 0.5 tablets (150 mg total) by mouth daily. 45 tablet 4  . aspirin 81 MG tablet Take 1 tablet (81 mg total) by mouth daily. 30 tablet 2  . atorvastatin (LIPITOR) 80 MG tablet Take 1 tablet (80 mg total) by mouth daily. 90 tablet 4  . clopidogrel (PLAVIX) 75 MG tablet Take 1 tablet (75 mg total) by mouth daily. 90 tablet 4  . esomeprazole (NEXIUM) 40 MG capsule TAKE 1 CAPSULE BY MOUTH TWICE DAILY BEFORE MEALS 180 capsule 4  . magnesium oxide (MAG-OX) 400 MG tablet Take 400 mg by mouth 2 (two) times daily.    . mycophenolate (CELLCEPT) 250 MG capsule Take 750 mg by mouth 2 (two) times daily.    [name]  nebivolol (BYSTOLIC) 5 MG tablet Take 1 tablet (5 mg total) by mouth daily. 90 tablet 4  . niacin (NIASPAN) 500 MG CR tablet TAKE 2 TABLETS BY MOUTH (1000MG ) AT BEDTIME 180 tablet 4  . NITROSTAT 0.4 MG SL tablet USE 1 TAB UNDER TONGUE EVERY 5 MINUTES UP TO 3 DOSES AS NEEDED 25 tablet 2  . predniSONE (DELTASONE) 5 MG tablet Take 1 tablet (5 mg total) by mouth daily. 90 tablet 4  . tacrolimus (PROGRAF) 0.5 MG capsule TAKE 2  TABLETS (1MG ) BY MOUTH IN THE AM & 1 TABLET (.05 MG) BY MOUTH IN THE PM    . valsartan (DIOVAN) 40 MG tablet Take 1 tablet (40 mg total) by mouth daily. 90 tablet 3  . Vitamin D, Ergocalciferol, (DRISDOL) 50000 UNITS CAPS capsule Take 50,000 Units by mouth every 7 (seven) days.    [name] zolpidem (AMBIEN) 5 MG tablet Take 5 mg by mouth at bedtime as needed for sleep.    . [DISCONTINUED] calcium carbonate, dosed in mg elemental calcium, 1250 MG/5ML Take 5 mLs (500 mg of elemental calcium total) by mouth every 6 (six) hours as needed. 450 mL 2  . [DISCONTINUED] dexlansoprazole (DEXILANT) 60 MG capsule Take 1 capsule (60 mg total) by mouth daily. 30 capsule 2   No current facility-administered medications for this visit.    Allergies  Review of patient's allergies indicates no known allergies.  Electrocardiogram:  02/26/14   SR rate 92 LVH biatrial enlargement anterior MI  LAD   Assessment and Plan CAD/CABG Stable with no angina and good activity level.  Continue medical Rx Chol:  Cholesterol is at goal.  Continue current dose of statin and diet Rx.  No myalgias or side effects.  F/U  LFT's in 6 months. Lab Results  Component Value Date   LDLCALC 53 03/22/2015            Renal transplant Cr normal on celcept and prednisone f/u renal HTN Well controlled.  Continue current medications and low sodium Dash type diet.    F/U with me in a year    [name]

## 2015-03-22 ENCOUNTER — Other Ambulatory Visit (INDEPENDENT_AMBULATORY_CARE_PROVIDER_SITE_OTHER): Payer: Commercial Indemnity

## 2015-03-22 DIAGNOSIS — E785 Hyperlipidemia, unspecified: Secondary | ICD-10-CM | POA: Diagnosis not present

## 2015-03-22 LAB — LIPID PANEL
CHOL/HDL RATIO: 2
Cholesterol: 127 mg/dL (ref 0–200)
HDL: 60.5 mg/dL (ref >39.00)
LDL Cholesterol: 53 mg/dL (ref 0–99)
NONHDL: 66.91
TRIGLYCERIDES: 70 mg/dL (ref 0.0–149.0)
VLDL: 14 mg/dL (ref 0.0–40.0)

## 2015-03-23 ENCOUNTER — Ambulatory Visit (INDEPENDENT_AMBULATORY_CARE_PROVIDER_SITE_OTHER): Payer: Managed Care, Other (non HMO) | Admitting: Cardiovascular Disease

## 2015-03-23 ENCOUNTER — Encounter: Payer: Self-pay | Admitting: Cardiovascular Disease

## 2015-03-23 VITALS — BP 130/76 | HR 76 | Ht 67.0 in | Wt 204.2 lb

## 2015-03-23 DIAGNOSIS — I10 Essential (primary) hypertension: Secondary | ICD-10-CM | POA: Diagnosis not present

## 2015-03-23 NOTE — Patient Instructions (Signed)
Medication Instructions:  Your physician recommends that you continue on your current medications as directed. Please refer to the Current Medication list given to you today.  Labwork: NONE  Testing/Procedures: NONE  Follow-Up: Your physician wants you to follow-up in: 1 year with Dr. Johnsie Cancel. You will receive a reminder letter in the mail two months in advance. If you don't receive a letter, please call our office to schedule the follow-up appointment.   Any Other Special Instructions Will Be Listed Below (If Applicable).     If you need a refill on your cardiac medications before your next appointment, please call your pharmacy.

## 2015-03-31 ENCOUNTER — Ambulatory Visit (INDEPENDENT_AMBULATORY_CARE_PROVIDER_SITE_OTHER): Payer: Managed Care, Other (non HMO) | Admitting: *Deleted

## 2015-03-31 DIAGNOSIS — I255 Ischemic cardiomyopathy: Secondary | ICD-10-CM | POA: Diagnosis not present

## 2015-03-31 NOTE — Progress Notes (Signed)
Remote ICD transmission.   

## 2015-04-12 LAB — CUP PACEART REMOTE DEVICE CHECK
Battery Voltage: 3.07 V
Date Time Interrogation Session: 20161117134837
HIGH POWER IMPEDANCE MEASURED VALUE: 304 Ohm
HIGH POWER IMPEDANCE MEASURED VALUE: 52 Ohm
HIGH POWER IMPEDANCE MEASURED VALUE: 63 Ohm
Lead Channel Sensing Intrinsic Amplitude: 15 mV
Lead Channel Sensing Intrinsic Amplitude: 15 mV
Lead Channel Setting Pacing Pulse Width: 0.4 ms
MDC IDC LEAD IMPLANT DT: 20120709
MDC IDC LEAD LOCATION: 753860
MDC IDC MSMT LEADCHNL RV IMPEDANCE VALUE: 361 Ohm
MDC IDC MSMT LEADCHNL RV PACING THRESHOLD AMPLITUDE: 1.25 V
MDC IDC MSMT LEADCHNL RV PACING THRESHOLD PULSEWIDTH: 0.4 ms
MDC IDC SET LEADCHNL RV PACING AMPLITUDE: 2.5 V
MDC IDC SET LEADCHNL RV SENSING SENSITIVITY: 0.3 mV
MDC IDC STAT BRADY RV PERCENT PACED: 0.04 %

## 2015-04-13 ENCOUNTER — Encounter: Payer: Self-pay | Admitting: Cardiology

## 2015-05-17 ENCOUNTER — Ambulatory Visit: Payer: Commercial Indemnity | Admitting: Gastroenterology

## 2015-06-15 ENCOUNTER — Other Ambulatory Visit: Payer: Self-pay | Admitting: Family Medicine

## 2015-06-15 NOTE — Telephone Encounter (Signed)
RF request for vitamin D3 5,000units LOV: 03/14/15 Next ov: 09/12/15 Last written: NA

## 2015-06-30 ENCOUNTER — Ambulatory Visit (INDEPENDENT_AMBULATORY_CARE_PROVIDER_SITE_OTHER): Payer: Managed Care, Other (non HMO) | Admitting: *Deleted

## 2015-06-30 DIAGNOSIS — I255 Ischemic cardiomyopathy: Secondary | ICD-10-CM | POA: Diagnosis not present

## 2015-06-30 NOTE — Progress Notes (Signed)
Remote ICD transmission.   

## 2015-07-06 ENCOUNTER — Ambulatory Visit: Payer: Managed Care, Other (non HMO) | Admitting: Gastroenterology

## 2015-07-17 LAB — CUP PACEART REMOTE DEVICE CHECK
Battery Voltage: 3.04 V
Brady Statistic RV Percent Paced: 0.01 %
HIGH POWER IMPEDANCE MEASURED VALUE: 52 Ohm
HIGH POWER IMPEDANCE MEASURED VALUE: 62 Ohm
HighPow Impedance: 304 Ohm
Implantable Lead Implant Date: 20120709
Lead Channel Impedance Value: 361 Ohm
Lead Channel Pacing Threshold Amplitude: 1.375 V
Lead Channel Sensing Intrinsic Amplitude: 14.75 mV
Lead Channel Sensing Intrinsic Amplitude: 14.75 mV
Lead Channel Setting Pacing Amplitude: 2.75 V
MDC IDC LEAD LOCATION: 753860
MDC IDC MSMT LEADCHNL RV PACING THRESHOLD PULSEWIDTH: 0.4 ms
MDC IDC SESS DTM: 20170216135824
MDC IDC SET LEADCHNL RV PACING PULSEWIDTH: 0.4 ms
MDC IDC SET LEADCHNL RV SENSING SENSITIVITY: 0.3 mV

## 2015-07-17 NOTE — Progress Notes (Signed)
Patient ID: Roy Dyer, male   DOB: Aug 08, 1965, 50 y.o.   MRN: [id number] Normal remote reviewed.  Next [facility] 09/29/15

## 2015-07-23 ENCOUNTER — Encounter: Payer: Self-pay | Admitting: Family Medicine

## 2015-07-27 ENCOUNTER — Encounter: Payer: Self-pay | Admitting: Cardiology

## 2015-08-23 ENCOUNTER — Other Ambulatory Visit: Payer: Self-pay | Admitting: Family Medicine

## 2015-08-23 NOTE — Telephone Encounter (Signed)
LOV: 03/14/15 NOV: 09/12/15  RF request for plavix Last written: 09/02/14 #90 w/ 4RF  RF request for atorvastatin Last written:09/02/14 #90 w/ 4RF  RF request for bystolic Last written: [id number] #90 w/ [id number]  RF request for niacin Last written: 09/02/14 #90 w/ [id number]

## 2015-08-29 ENCOUNTER — Encounter: Payer: Self-pay | Admitting: Internal Medicine

## 2015-08-29 ENCOUNTER — Telehealth: Payer: Self-pay | Admitting: *Deleted

## 2015-08-29 ENCOUNTER — Ambulatory Visit (INDEPENDENT_AMBULATORY_CARE_PROVIDER_SITE_OTHER): Payer: Managed Care, Other (non HMO) | Admitting: Internal Medicine

## 2015-08-29 VITALS — BP 106/70 | HR 69 | Ht 67.0 in | Wt 211.0 lb

## 2015-08-29 DIAGNOSIS — Z8719 Personal history of other diseases of the digestive system: Secondary | ICD-10-CM | POA: Diagnosis not present

## 2015-08-29 DIAGNOSIS — Z940 Kidney transplant status: Secondary | ICD-10-CM

## 2015-08-29 DIAGNOSIS — K219 Gastro-esophageal reflux disease without esophagitis: Secondary | ICD-10-CM | POA: Diagnosis not present

## 2015-08-29 DIAGNOSIS — Z7902 Long term (current) use of antithrombotics/antiplatelets: Secondary | ICD-10-CM | POA: Diagnosis not present

## 2015-08-29 NOTE — Telephone Encounter (Signed)
Patient advised that he may hold plavix 5 days prior to procedure. Patient verbalizes understanding.

## 2015-08-29 NOTE — Progress Notes (Signed)
Patient ID: Roy Dyer, male   DOB: February 15, 1966, 50 y.o.   MRN: [id number] HPI: Roy Dyer is a 50 year old male with history of GERD with esophagitis, lupus nephropathy and end-stage renal disease status post kidney transplant in 2014, cardiomyopathy with ICD and EF 30-35%, hypertension and gout who seen in consultation at the request of Dr. Anitra Lauth to evaluate reflux disease. He is here alone today. He reports long-standing reflux disease dating back to age 8. He's been on acid suppression and some form or fashion since this time. He's had worsening of overall GERD symptoms since kidney transplant which he feels is related to his immunosuppression medication. He attempted to discontinue PPI in favor of H2 blocker at high dose but his reflux worsened significantly. He is discussed PPI and renal insufficiency with nephrology but has been unable to tolerate being off of acid suppression. He sees Dr. Moshe Cipro in town and Dr. Lissa Merlin at [facility]. His reflux symptom is primarily burning in his upper chest and into the back of his throat along with regurgitation. He does try to follow a GERD lifestyle by avoiding eating late, propping the head of his bed and avoiding trigger foods. Despite this he still has 3-4 days per month where he has severe reflux attack which interferes with sleep and occasionally wakes up with coughing and feeling like she's choking. He reports his dentist is noted erosion of enamel blamed on reflux. He denies dysphagia though reports years ago he had dysphagia and a dilation at the time of endoscopy performed in [place]. He does take daily Plavix. 4 anti-rejection with kidney transplant he takes mycophenolate and tacrolimus.  Currently he is using Nexium 40 mg twice a day and reports this works fairly well though occasionally has breakthrough for which he will use Tums. No dysphagia or odynophagia  On review of records he had an upper endoscopy performed by Christen Butter on 02/26/2009  performed in the hospital setting after an episode of hematemesis. This revealed esophagitis in the mid and distal esophagus with erosions and friability. It was not biopsied due to anticoagulation. The Z line was reportedly well seen. The stomach and examined small bowel were unremarkable.  He denies a family history of GI tract malignancy.  Past Medical History  Diagnosis Date  . Lupus ([place])   . CAD (coronary artery disease)      stents RCA/Circ 2001, BMS to LAD 07/2010  . Cardiomyopathy     Ischemic:  02/2011 EF 30-35%.   Single chamber ICD 11/20/10 ([name])  . Gout     s/p renal transplant he was weened off of his allopurinol.  . Avascular necrosis of bone of hip ([place]) 2010 surg    Left hip arthroplasty: from chronic systemic steroids taken for his Lupus  . 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 implantable cardiac defibrillator single chamber     Medtronic (due to low EF)  . Hypertension   . Myocardial infarction [facility]) 2001 and 2012    3 stents and defib placed in July  . GERD (gastroesophageal reflux disease)     Hx of esoph stricture and dilatation  . Hiatal hernia   . End stage renal disease ([place]) 04/24/2011    Secondary to SLE: HD 06/2011-10/2012 (then got renal transplant)  . History of renal transplant 11/10/2012    10/2012 [facility]   . Chronic renal insufficiency, stage II (mild)  GFR 65 ml/min 07/19/15 at local renal f/u (Cr 1.29)  . Post-transplant erythrocytosis     improved with ARB (valsartan)    Past Surgical History  Procedure Laterality Date  . Stents      05-2010 and 2- in 2010  . Partial hip arthroplasty      left  . Renal biopsy    . Tympanostomy tube placement  50 yrs old  . Av fistula placement  03/28/2011    Procedure: ARTERIOVENOUS (AV) FISTULA CREATION;  Surgeon: Rosetta Posner, MD;  Location: [place];  Service: Vascular;  Laterality: Left;  Creation of Left  radiocephallic cimino fistula  . Cardiac catheterization      08/2010  . US echocardiography  12/2010; 02/2014;08/2014    02/2014 EF still 25-30%, increased PA pressures.  2016 EF 30-35%, sept/inf hypokinesis, mod tricusp regurg  . Cardiovascular stress test  07/2010; 03/2014    03/2014 showed large old infarct and EF 30-35% but no ischemia  . Av fistula placement  04/27/2011    Procedure: ARTERIOVENOUS (AV) FISTULA CREATION;  Surgeon: Rosetta Posner, MD;  Location: [place];  Service: Vascular;  Laterality: Left;  left basilic vein transposition  . Insert / replace / remove pacemaker      medtronic        dr [name]    [place]   icd only  . Total hip arthroplasty  07/09/2011    Procedure: TOTAL HIP ARTHROPLASTY;  Surgeon: Kerin Salen, MD;  Location: [facility];  Service: Orthopedics;  Laterality: Right;  . Kidney transplant  10/2012    [facility] nephrologist--Dr. Blair Heys  . Esophagogastroduodenoscopy  02/2009    Done by Dr. Fuller Plan for hematemesis; esophagitis found    Outpatient Prescriptions Prior to Visit  Medication Sig Dispense Refill  . allopurinol (ZYLOPRIM) 300 MG tablet Take 0.5 tablets (150 mg total) by mouth daily. 45 tablet 4  . aspirin 81 MG tablet Take 1 tablet (81 mg total) by mouth daily. 30 tablet 2  . atorvastatin (LIPITOR) 80 MG tablet TAKE 1 TABLET BY MOUTH DAILY 90 tablet 3  . BYSTOLIC 5 MG tablet TAKE 1 TABLET BY MOUTH DAILY 90 tablet 3  . Cholecalciferol (VITAMIN D3) 5000 units CAPS TAKE 1 CAPSULE BY MOUTH DAILY (Patient taking differently: TAKE 1 CAPSULE BY MOUTH tuesday, thursday, saturday, sunday) 100 capsule 2  . clopidogrel (PLAVIX) 75 MG tablet TAKE 1 TABLET BY MOUTH DAILY 90 tablet 3  . esomeprazole (NEXIUM) 40 MG capsule TAKE 1 CAPSULE BY MOUTH TWICE DAILY BEFORE MEALS 180 capsule 4  . magnesium oxide (MAG-OX) 400 MG tablet Take 400 mg by mouth daily.     . mycophenolate (CELLCEPT) 250 MG capsule Take 750 mg by mouth 2 (two) times daily.    . niacin (NIASPAN) 500 MG  CR tablet TAKE 2 TABLETS BY MOUTH (1000MG ) AT BEDTIME 180 tablet 4  . niacin (NIASPAN) 500 MG CR tablet TAKE 2 TABLETS BY MOUTH AT BEDTIME 180 tablet 3  . NITROSTAT 0.4 MG SL tablet USE 1 TAB UNDER TONGUE EVERY 5 MINUTES UP TO 3 DOSES AS NEEDED 25 tablet 2  . predniSONE (DELTASONE) 5 MG tablet Take 1 tablet (5 mg total) by mouth daily. 90 tablet 4  . tacrolimus (PROGRAF) 0.5 MG capsule Take 0.1 mg by mouth. TAKE 2 TABLETS (1MG ) BY MOUTH daily; take 1 tab Monday Wednesday Friday am    . valsartan (DIOVAN) 40 MG tablet Take 1 tablet (40 mg total) by mouth daily.  90 tablet 3  . zolpidem (AMBIEN) 5 MG tablet Take 5 mg by mouth at bedtime as needed for sleep.    [name] atorvastatin (LIPITOR) 80 MG tablet Take 1 tablet (80 mg total) by mouth daily. 90 tablet 4  . clopidogrel (PLAVIX) 75 MG tablet Take 1 tablet (75 mg total) by mouth daily. 90 tablet 4  . Vitamin D, Ergocalciferol, (DRISDOL) 50000 UNITS CAPS capsule Take 50,000 Units by mouth every 7 (seven) days.     No facility-administered medications prior to visit.    No Known Allergies  Family History  Problem Relation Age of Onset  . Kidney failure Mother   . Lupus Mother   . Stroke Mother   . Diabetes Mother     type 2  . Heart attack Father     X 7  . Hypertension Father   . Hyperlipidemia Father   . Heart attack Sister 21    X 1  . Hyperlipidemia Sister   . Hypertension Sister   . Heart disease Sister   . Heart attack Paternal Grandfather   . Heart disease Paternal Aunt   . Heart disease Paternal Uncle     Social History  Substance Use Topics  . Smoking status: Former Smoker -- 0.50 packs/day for 30 years    Types: Cigarettes    Quit date: 08/02/2010  . Smokeless tobacco: Never Used  . Alcohol Use: No    ROS: As per history of present illness, otherwise negative  BP 106/70 mmHg  Pulse 69  Ht 5\' 7"  (1.702 m)  Wt 211 lb (95.709 kg)  BMI 33.04 kg/m2 Constitutional: Well-developed and well-nourished. No  distress. HEENT: Normocephalic and atraumatic. Oropharynx is clear and moist. No oropharyngeal exudate. Conjunctivae are normal.  No scleral icterus. Neck: Neck supple. Trachea midline. Cardiovascular: Normal rate, regular rhythm and intact distal pulses. No M/R/G.ICD in right upper chest palpable Pulmonary/chest: Effort normal and breath sounds normal. No wheezing, rales or rhonchi. Abdominal: Soft, nontender, nondistended. Bowel sounds active throughout. Transplant kidney palpable right lower quadrant and nontender Extremities: no clubbing, cyanosis, or edema Lymphadenopathy: No cervical adenopathy noted. Neurological: Alert and oriented to person place and time. Skin: Skin is warm and dry. No rashes noted. Psychiatric: Normal mood and affect. Behavior is normal.   ASSESSMENT/PLAN: 50 year old male with history of GERD with esophagitis, lupus nephropathy and end-stage renal disease status post kidney transplant in 2014, cardiomyopathy with ICD and EF 30-35%, hypertension and gout who seen in consultation at the request of Dr. Anitra Lauth to evaluate reflux disease  1. GERD with hx of esophagitis -- long-standing reflux disease and history of esophagitis. I suspect the CellCept has led to worsening reflux. Trial off of PPI failed due to severe symptoms. We discussed changing PPI to [place] in an attempt to better control symptoms and I have recommended upper endoscopy for direct visualization and to exclude Barrett's esophagus. We discussed the risks, benefits and alternatives of upper endoscopy and he is agreeable to proceed. For now he would prefer to continue Nexium twice a day before meals and follow GERD lifestyle/diet. Further recommendations after endoscopy. --Hold Plavix 5 days before procedure - will instruct when and how to resume after procedure. Risks and benefits of procedure including bleeding, perforation, infection, missed lesions, medication reactions and possible hospitalization or  surgery if complications occur explained. Additional rare but real risk of cardiovascular event such as heart attack or ischemia/infarct of other organs off Plavix explained and need to seek urgent help if this  occurs. Will communicate by phone or EMR with patient's prescribing provider that to confirm holding Plavix is reasonable in this case.   2. CRC screening -- screening colonoscopy recommended age 4     [id number] [address], [address] [place], [place] [address]

## 2015-08-29 NOTE — Patient Instructions (Addendum)
You have been scheduled for an endoscopy. Please follow written instructions given to you at your visit today. If you use inhalers (even only as needed), please bring them with you on the day of your procedure. Your physician has requested that you go to www.startemmi.com and enter the access code given to you at your visit today. This web site gives a general overview about your procedure. However, you should still follow specific instructions given to you by our office regarding your preparation for the procedure.   Continue Nexium twice daily before meals.  Please follow a GERD diet and lifestyle.  Food Choices for Gastroesophageal Reflux Disease, Adult When you have gastroesophageal reflux disease (GERD), the foods you eat and your eating habits are very important. Choosing the right foods can help ease the discomfort of GERD. WHAT GENERAL GUIDELINES DO I NEED TO FOLLOW?  Choose fruits, vegetables, whole grains, low-fat dairy products, and low-fat meat, fish, and poultry.  Limit fats such as oils, salad dressings, butter, nuts, and avocado.  Keep a food diary to identify foods that cause symptoms.  Avoid foods that cause reflux. These may be different for different people.  Eat frequent small meals instead of three large meals each day.  Eat your meals slowly, in a relaxed setting.  Limit fried foods.  Cook foods using methods other than frying.  Avoid drinking alcohol.  Avoid drinking large amounts of liquids with your meals.  Avoid bending over or lying down until 2-3 hours after eating. WHAT FOODS ARE NOT RECOMMENDED? The following are some foods and drinks that may worsen your symptoms: Vegetables Tomatoes. Tomato juice. Tomato and spaghetti sauce. Chili peppers. Onion and garlic. Horseradish. Fruits Oranges, grapefruit, and lemon (fruit and juice). Meats High-fat meats, fish, and poultry. This includes hot dogs, ribs, ham, sausage, salami, and bacon. Dairy Whole  milk and chocolate milk. Sour cream. Cream. Butter. Ice cream. Cream cheese.  Beverages Coffee and tea, with or without caffeine. Carbonated beverages or energy drinks. Condiments Hot sauce. Barbecue sauce.  Sweets/Desserts Chocolate and cocoa. Donuts. Peppermint and spearmint. Fats and Oils High-fat foods, including [place] fries and potato chips. Other Vinegar. Strong spices, such as black pepper, white pepper, red pepper, cayenne, curry powder, cloves, ginger, and chili powder. The items listed above may not be a complete list of foods and beverages to avoid. Contact your dietitian for more information.   This information is not intended to replace advice given to you by your health care provider. Make sure you discuss any questions you have with your health care provider.   Document Released: 04/30/2005 Document Revised: 05/21/2014 Document Reviewed: 03/04/2013 Elsevier Interactive Patient Education 2016 [place].  Gastroesophageal Reflux Disease, Adult Normally, food travels down the esophagus and stays in the stomach to be digested. However, when a person has gastroesophageal reflux disease (GERD), food and stomach acid move back up into the esophagus. When this happens, the esophagus becomes sore and inflamed. Over time, GERD can create small holes (ulcers) in the lining of the esophagus.  CAUSES This condition is caused by a problem with the muscle between the esophagus and the stomach (lower esophageal sphincter, or LES). Normally, the LES muscle closes after food passes through the esophagus to the stomach. When the LES is weakened or abnormal, it does not close properly, and that allows food and stomach acid to go back up into the esophagus. The LES can be weakened by certain dietary substances, medicines, and medical conditions, including:  Tobacco use.  Pregnancy.  Having a hiatal hernia.  Heavy alcohol use.  Certain foods and beverages, such as coffee, chocolate,  onions, and peppermint. RISK FACTORS This condition is more likely to develop in:  People who have an increased body weight.  People who have connective tissue disorders.  People who use NSAID medicines. SYMPTOMS Symptoms of this condition include:  Heartburn.  Difficult or painful swallowing.  The feeling of having a lump in the throat.  Abitter taste in the mouth.  Bad breath.  Having a large amount of saliva.  Having an upset or bloated stomach.  Belching.  Chest pain.  Shortness of breath or wheezing.  Ongoing (chronic) cough or a night-time cough.  Wearing away of tooth enamel.  Weight loss. Different conditions can cause chest pain. Make sure to see your health care provider if you experience chest pain. DIAGNOSIS Your health care provider will take a medical history and perform a physical exam. To determine if you have mild or severe GERD, your health care provider may also monitor how you respond to treatment. You may also have other tests, including:  An endoscopy toexamine your stomach and esophagus with a small camera.  A test thatmeasures the acidity level in your esophagus.  A test thatmeasures how much pressure is on your esophagus.  A barium swallow or modified barium swallow to show the shape, size, and functioning of your esophagus. TREATMENT The goal of treatment is to help relieve your symptoms and to prevent complications. Treatment for this condition may vary depending on how severe your symptoms are. Your health care provider may recommend:  Changes to your diet.  Medicine.  Surgery. HOME CARE INSTRUCTIONS Diet  Follow a diet as recommended by your health care provider. This may involve avoiding foods and drinks such as:  Coffee and tea (with or without caffeine).  Drinks that containalcohol.  Energy drinks and sports drinks.  Carbonated drinks or sodas.  Chocolate and cocoa.  Peppermint and mint flavorings.  Garlic  and onions.  Horseradish.  Spicy and acidic foods, including peppers, chili powder, curry powder, vinegar, hot sauces, and barbecue sauce.  Citrus fruit juices and citrus fruits, such as oranges, lemons, and limes.  Tomato-based foods, such as red sauce, chili, salsa, and pizza with red sauce.  Fried and fatty foods, such as donuts, french fries, potato chips, and high-fat dressings.  High-fat meats, such as hot dogs and fatty cuts of red and white meats, such as rib eye steak, sausage, ham, and bacon.  High-fat dairy items, such as whole milk, butter, and cream cheese.  Eat small, frequent meals instead of large meals.  Avoid drinking large amounts of liquid with your meals.  Avoid eating meals during the 2-3 hours before bedtime.  Avoid lying down right after you eat.  Do not exercise right after you eat. General Instructions  Pay attention to any changes in your symptoms.  Take over-the-counter and prescription medicines only as told by your health care provider. Do not take aspirin, ibuprofen, or other NSAIDs unless your health care provider told you to do so.  Do not use any tobacco products, including cigarettes, chewing tobacco, and e-cigarettes. If you need help quitting, ask your health care provider.  Wear loose-fitting clothing. Do not wear anything tight around your waist that causes pressure on your abdomen.  Raise (elevate) the head of your bed 6 inches (15cm).  Try to reduce your stress, such as with yoga or meditation. If you need help reducing stress,  ask your health care provider.  If you are overweight, reduce your weight to an amount that is healthy for you. Ask your health care provider for guidance about a safe weight loss goal.  Keep all follow-up visits as told by your health care provider. This is important. SEEK MEDICAL CARE IF:  You have new symptoms.  You have unexplained weight loss.  You have difficulty swallowing, or it hurts to  swallow.  You have wheezing or a persistent cough.  Your symptoms do not improve with treatment.  You have a hoarse voice. SEEK IMMEDIATE MEDICAL CARE IF:  You have pain in your arms, neck, jaw, teeth, or back.  You feel sweaty, dizzy, or light-headed.  You have chest pain or shortness of breath.  You vomit and your vomit looks like blood or coffee grounds.  You faint.  Your stool is bloody or black.  You cannot swallow, drink, or eat.   This information is not intended to replace advice given to you by your health care provider. Make sure you discuss any questions you have with your health care provider.   Document Released: 02/07/2005 Document Revised: 01/19/2015 Document Reviewed: 08/25/2014 Elsevier Interactive Patient Education [employer].

## 2015-08-29 NOTE — Telephone Encounter (Signed)
OK to come off plavix as per your request.

## 2015-08-29 NOTE — Telephone Encounter (Signed)
Left voicemail for patient to call back. 

## 2015-08-29 NOTE — Telephone Encounter (Signed)
08/29/2015 RE: AKAI WEATHERBY DOB: 06-29-1965 MRN: [id number]  Dear Dr Anitra Lauth,   We have scheduled the above patient for an endoscopy procedure. Our records show that he is on anticoagulation therapy.  Please advise as to weather the patient may come off his therapy of Plavix 5 days prior to the procedure, which is scheduled for 09/29/15. Please route your response to Dixon Boos, CMA  Sincerely,   Dixon Boos

## 2015-09-12 ENCOUNTER — Encounter: Payer: Self-pay | Admitting: Family Medicine

## 2015-09-12 ENCOUNTER — Ambulatory Visit (INDEPENDENT_AMBULATORY_CARE_PROVIDER_SITE_OTHER): Payer: Managed Care, Other (non HMO) | Admitting: Family Medicine

## 2015-09-12 VITALS — BP 133/76 | HR 70 | Temp 97.8°F | Resp 16 | Ht 67.0 in | Wt 209.0 lb

## 2015-09-12 DIAGNOSIS — Z940 Kidney transplant status: Secondary | ICD-10-CM | POA: Diagnosis not present

## 2015-09-12 DIAGNOSIS — E782 Mixed hyperlipidemia: Secondary | ICD-10-CM | POA: Diagnosis not present

## 2015-09-12 DIAGNOSIS — I10 Essential (primary) hypertension: Secondary | ICD-10-CM | POA: Diagnosis not present

## 2015-09-12 DIAGNOSIS — M329 Systemic lupus erythematosus, unspecified: Secondary | ICD-10-CM

## 2015-09-12 DIAGNOSIS — G44219 Episodic tension-type headache, not intractable: Secondary | ICD-10-CM

## 2015-09-12 DIAGNOSIS — K219 Gastro-esophageal reflux disease without esophagitis: Secondary | ICD-10-CM

## 2015-09-12 NOTE — Progress Notes (Signed)
Pre visit review using our clinic review tool, if applicable. No additional management support is needed unless otherwise documented below in the visit note. 

## 2015-09-12 NOTE — Progress Notes (Signed)
OFFICE VISIT  09/12/2015   CC:  Chief Complaint  Patient presents with  . Follow-up    Pt is not fasting.    HPI:    Patient is a 50 y.o. Caucasian male who presents for 6 mo f/u Lupus, HTN, HLD, CRI stage 2, GERD, CAD.  Since I last saw him he followed up with cardiology and all was stable, no changes in regimen. He also followed up with GI and they plan on doing EGD to further assess him due to his poorly controlled GERD. He remains on bid nexium and GER diet.    Reviewed nephrologist note from 07/2015: Cr 1.29--stable.  Review of his skin at derm showed no worrisome lesions.  Says he is having right sided HA's, started 4-6 mo ago, behind his eye, an ache, usually at end of work day, usually goes away on its own but sometimes he has to try tylenol and this does not help. No nausea, no photophobia or phonophobia.  Went to eye MD: vision is fine, no eye issues. Now he is thinking they may be more allergy-related.  ROS: no CP, no SOB, no palpitations, no dizziness.  Past Medical History  Diagnosis Date  . Lupus ([place])   . CAD (coronary artery disease)      stents RCA/Circ 2001, BMS to LAD 07/2010  . Cardiomyopathy     Ischemic:  02/2011 EF 30-35%.   Single chamber ICD 11/20/10 ([name])  . Gout     s/p renal transplant he was weened off of his allopurinol.  . Avascular necrosis of bone of hip ([place]) 2010 surg    Left hip arthroplasty: from chronic systemic steroids taken for his Lupus  . 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 implantable cardiac defibrillator single chamber     Medtronic (due to low EF)  . Hypertension   . Myocardial infarction [facility]) 2001 and 2012    3 stents and defib placed in July  . GERD (gastroesophageal reflux disease)     Hx of esoph stricture and dilatation  . Hiatal hernia   . End stage renal disease ([place]) 04/24/2011    Secondary to SLE: HD 06/2011-10/2012 (then  got renal transplant)  . History of renal transplant 11/10/2012    10/2012 [facility]   . Chronic renal insufficiency, stage II (mild)     GFR 65 ml/min 07/19/15 at local renal f/u (Cr 1.29)  . Post-transplant erythrocytosis     improved with ARB (valsartan)    Past Surgical History  Procedure Laterality Date  . Stents      05-2010 and 2- in 2010  . Partial hip arthroplasty      left  . Renal biopsy    . Tympanostomy tube placement  50 yrs old  . Av fistula placement  03/28/2011    Procedure: ARTERIOVENOUS (AV) FISTULA CREATION;  Surgeon: Rosetta Posner, MD;  Location: [place];  Service: Vascular;  Laterality: Left;  Creation of Left radiocephallic cimino fistula  . Cardiac catheterization      08/2010  . US echocardiography  12/2010; 02/2014;08/2014    02/2014 EF still 25-30%, increased PA pressures.  2016 EF 30-35%, sept/inf hypokinesis, mod tricusp regurg  . Cardiovascular stress test  07/2010; 03/2014    03/2014 showed large old infarct and EF 30-35% but no ischemia  . Av fistula placement  04/27/2011    Procedure: ARTERIOVENOUS (AV)  FISTULA CREATION;  Surgeon: Rosetta Posner, MD;  Location: [place];  Service: Vascular;  Laterality: Left;  left basilic vein transposition  . Insert / replace / remove pacemaker      medtronic        dr [name]    [place]   icd only  . Total hip arthroplasty  07/09/2011    Procedure: TOTAL HIP ARTHROPLASTY;  Surgeon: Kerin Salen, MD;  Location: [place];  Service: Orthopedics;  Laterality: Right;  . Kidney transplant  10/2012    [facility] nephrologist--Dr. Blair Heys  . Esophagogastroduodenoscopy  02/2009    Done by Dr. Fuller Plan for hematemesis; esophagitis found    Outpatient Prescriptions Prior to Visit  Medication Sig Dispense Refill  . allopurinol (ZYLOPRIM) 300 MG tablet Take 0.5 tablets (150 mg total) by mouth daily. 45 tablet 4  . aspirin 81 MG tablet Take 1 tablet (81 mg total) by mouth daily. 30 tablet 2  . atorvastatin (LIPITOR) 80 MG tablet TAKE 1 TABLET BY  MOUTH DAILY 90 tablet 3  . BYSTOLIC 5 MG tablet TAKE 1 TABLET BY MOUTH DAILY 90 tablet 3  . Cholecalciferol (VITAMIN D3) 5000 units CAPS TAKE 1 CAPSULE BY MOUTH DAILY (Patient taking differently: TAKE 1 CAPSULE BY MOUTH tuesday, thursday, saturday, sunday) 100 capsule 2  . clopidogrel (PLAVIX) 75 MG tablet TAKE 1 TABLET BY MOUTH DAILY 90 tablet 3  . esomeprazole (NEXIUM) 40 MG capsule TAKE 1 CAPSULE BY MOUTH TWICE DAILY BEFORE MEALS 180 capsule 4  . magnesium oxide (MAG-OX) 400 MG tablet Take 400 mg by mouth daily.     . mycophenolate (CELLCEPT) 250 MG capsule Take 750 mg by mouth 2 (two) times daily.    . niacin (NIASPAN) 500 MG CR tablet TAKE 2 TABLETS BY MOUTH AT BEDTIME 180 tablet 3  . NITROSTAT 0.4 MG SL tablet USE 1 TAB UNDER TONGUE EVERY 5 MINUTES UP TO 3 DOSES AS NEEDED 25 tablet 2  . predniSONE (DELTASONE) 5 MG tablet Take 1 tablet (5 mg total) by mouth daily. 90 tablet 4  . tacrolimus (PROGRAF) 0.5 MG capsule Take 0.1 mg by mouth. TAKE 2 TABLETS (1MG ) BY MOUTH daily; take 1 tab Monday Wednesday Friday am    . valsartan (DIOVAN) 40 MG tablet Take 1 tablet (40 mg total) by mouth daily. 90 tablet 3  . zolpidem (AMBIEN) 5 MG tablet Take 5 mg by mouth at bedtime as needed for sleep.    . niacin (NIASPAN) 500 MG CR tablet TAKE 2 TABLETS BY MOUTH (1000MG ) AT BEDTIME (Patient not taking: Reported on 09/12/2015) 180 tablet 4   No facility-administered medications prior to visit.    No Known Allergies  ROS As per HPI  PE: Blood pressure 133/76, pulse 70, temperature 97.8 F (36.6 C), temperature source Oral, resp. rate 16, height 5\' 7"  (1.702 m), weight 209 lb (94.802 kg), SpO2 97 %. Gen: Alert, well appearing.  Patient is oriented to person, place, time, and situation. ENT: PERRL, EOMI, no temple or TMJ tenderness. CV: RRR, no m/r/g.   LUNGS: CTA bilat, nonlabored resps, good aeration in all lung fields. EXT: no clubbing, cyanosis, or edema.    LABS:  Lab Results  Component Value  Date   TSH 0.52 09/03/2013   Lab Results  Component Value Date   WBC 10.4 04/17/2013   HGB 16.0 04/17/2013   HCT 48.7 04/17/2013   MCV 92.8 04/17/2013   PLT 170 04/17/2013   Lab Results  Component Value  Date   CREATININE 1.50* 04/17/2013   BUN 16 04/17/2013   NA 139 04/17/2013   K 4.0 04/17/2013   CL 103 04/17/2013   CO2 24 04/17/2013   Lab Results  Component Value Date   ALT 11 06/27/2011   AST 18 06/27/2011   ALKPHOS 92 06/27/2011   BILITOT 0.7 06/27/2011   Lab Results  Component Value Date   CHOL 127 03/22/2015   Lab Results  Component Value Date   HDL 60.50 03/22/2015   Lab Results  Component Value Date   LDLCALC 53 03/22/2015   Lab Results  Component Value Date   TRIG 70.0 03/22/2015   Lab Results  Component Value Date   CHOLHDL 2 03/22/2015   Lab Results  Component Value Date   PSA 1.71 10/23/2010   Lab Results  Component Value Date   HGBA1C 5.6 04/23/2011    IMPRESSION AND PLAN:  1) Lupus: quiescent.  2) HTN: The current medical regimen is effective;  continue present plan and medications. Lytes 07/2015 at his nephrologist's office were good.  3) CRI, stage 2 (GFR about 65 ml/min per 07/2015 labs), hx of kidney transplant: continue current anti-rejection meds and continue routine f/u's with [facility] and [facility] (Dr. Moshe Cipro).  4) GERD: continue nexium 40 mg bid.  Has plans with GI for EGD soon.  5) CAD with ischemic CM s/p ICD: stable.  Continue current meds and continue approp f/u with Dr. Johnsie Cancel.  6) Tension HA's: continue with prn tylenol.  An After Visit Summary was printed and given to the patient.  FOLLOW UP: Return in about 6 months (around 03/14/2016) for routine chronic illness f/u.  Signed:  Crissie Sickles, MD           09/12/2015

## 2015-09-22 ENCOUNTER — Encounter (HOSPITAL_COMMUNITY): Payer: Self-pay | Admitting: *Deleted

## 2015-09-28 ENCOUNTER — Telehealth: Payer: Self-pay | Admitting: Internal Medicine

## 2015-09-28 NOTE — Telephone Encounter (Signed)
Explained to pt that antibiotics are not needed for his procedure.

## 2015-09-29 ENCOUNTER — Ambulatory Visit (HOSPITAL_COMMUNITY): Payer: Managed Care, Other (non HMO) | Admitting: Anesthesiology

## 2015-09-29 ENCOUNTER — Ambulatory Visit (INDEPENDENT_AMBULATORY_CARE_PROVIDER_SITE_OTHER): Payer: Managed Care, Other (non HMO) | Admitting: *Deleted

## 2015-09-29 ENCOUNTER — Encounter (HOSPITAL_COMMUNITY): Payer: Self-pay | Admitting: Anesthesiology

## 2015-09-29 ENCOUNTER — Encounter (HOSPITAL_COMMUNITY)
Admission: RE | Disposition: A | Discharge status: Home / Self Care | Payer: Self-pay | Source: Ambulatory Visit | Attending: Internal Medicine

## 2015-09-29 ENCOUNTER — Ambulatory Visit (HOSPITAL_COMMUNITY)
Admission: RE | Admit: 2015-09-29 | Discharge: 2015-09-29 | Disposition: A | Discharge status: Home / Self Care | Payer: Managed Care, Other (non HMO) | Source: Ambulatory Visit | Attending: Internal Medicine | Admitting: Internal Medicine

## 2015-09-29 DIAGNOSIS — K2270 Barrett's esophagus without dysplasia: Secondary | ICD-10-CM | POA: Insufficient documentation

## 2015-09-29 DIAGNOSIS — I252 Old myocardial infarction: Secondary | ICD-10-CM | POA: Insufficient documentation

## 2015-09-29 DIAGNOSIS — I739 Peripheral vascular disease, unspecified: Secondary | ICD-10-CM | POA: Insufficient documentation

## 2015-09-29 DIAGNOSIS — N182 Chronic kidney disease, stage 2 (mild): Secondary | ICD-10-CM | POA: Diagnosis not present

## 2015-09-29 DIAGNOSIS — K2100 Gastro-esophageal reflux disease with esophagitis, without bleeding: Secondary | ICD-10-CM | POA: Insufficient documentation

## 2015-09-29 DIAGNOSIS — Z87891 Personal history of nicotine dependence: Secondary | ICD-10-CM | POA: Diagnosis not present

## 2015-09-29 DIAGNOSIS — I255 Ischemic cardiomyopathy: Secondary | ICD-10-CM

## 2015-09-29 DIAGNOSIS — K219 Gastro-esophageal reflux disease without esophagitis: Secondary | ICD-10-CM | POA: Diagnosis present

## 2015-09-29 DIAGNOSIS — Z6832 Body mass index (BMI) 32.0-32.9, adult: Secondary | ICD-10-CM | POA: Insufficient documentation

## 2015-09-29 DIAGNOSIS — I429 Cardiomyopathy, unspecified: Secondary | ICD-10-CM | POA: Insufficient documentation

## 2015-09-29 DIAGNOSIS — Z96641 Presence of right artificial hip joint: Secondary | ICD-10-CM | POA: Diagnosis not present

## 2015-09-29 DIAGNOSIS — I129 Hypertensive chronic kidney disease with stage 1 through stage 4 chronic kidney disease, or unspecified chronic kidney disease: Secondary | ICD-10-CM | POA: Diagnosis not present

## 2015-09-29 DIAGNOSIS — Z940 Kidney transplant status: Secondary | ICD-10-CM | POA: Insufficient documentation

## 2015-09-29 DIAGNOSIS — K2950 Unspecified chronic gastritis without bleeding: Secondary | ICD-10-CM | POA: Diagnosis not present

## 2015-09-29 DIAGNOSIS — I2510 Atherosclerotic heart disease of native coronary artery without angina pectoris: Secondary | ICD-10-CM | POA: Diagnosis not present

## 2015-09-29 DIAGNOSIS — K449 Diaphragmatic hernia without obstruction or gangrene: Secondary | ICD-10-CM | POA: Diagnosis not present

## 2015-09-29 DIAGNOSIS — Z955 Presence of coronary angioplasty implant and graft: Secondary | ICD-10-CM | POA: Insufficient documentation

## 2015-09-29 DIAGNOSIS — Z95810 Presence of automatic (implantable) cardiac defibrillator: Secondary | ICD-10-CM

## 2015-09-29 DIAGNOSIS — M329 Systemic lupus erythematosus, unspecified: Secondary | ICD-10-CM | POA: Insufficient documentation

## 2015-09-29 DIAGNOSIS — K3189 Other diseases of stomach and duodenum: Secondary | ICD-10-CM | POA: Insufficient documentation

## 2015-09-29 DIAGNOSIS — E669 Obesity, unspecified: Secondary | ICD-10-CM | POA: Diagnosis not present

## 2015-09-29 HISTORY — PX: ESOPHAGOGASTRODUODENOSCOPY (EGD) WITH PROPOFOL: SHX5813

## 2015-09-29 HISTORY — DX: Presence of automatic (implantable) cardiac defibrillator: Z95.810

## 2015-09-29 SURGERY — ESOPHAGOGASTRODUODENOSCOPY (EGD) WITH PROPOFOL
Anesthesia: General

## 2015-09-29 SURGERY — ESOPHAGOGASTRODUODENOSCOPY (EGD) WITH PROPOFOL
Anesthesia: Monitor Anesthesia Care

## 2015-09-29 MED ORDER — LACTATED RINGERS IV SOLN
INTRAVENOUS | Status: DC
  Administered 2015-09-29: 1000 mL via INTRAVENOUS

## 2015-09-29 MED ORDER — LIDOCAINE HCL (CARDIAC) 20 MG/ML IV SOLN
INTRAVENOUS | Status: DC | PRN
  Administered 2015-09-29: 25 mg via INTRATRACHEAL

## 2015-09-29 MED ORDER — FENTANYL CITRATE (PF) 100 MCG/2ML IJ SOLN: 25.0000 ug | INTRAMUSCULAR | Status: DC | PRN

## 2015-09-29 MED ORDER — SUCRALFATE 1 GM/10ML PO SUSP: 1.0000 g | Freq: Three times a day (TID) | ORAL | Status: DC

## 2015-09-29 MED ORDER — SODIUM CHLORIDE 0.9 % IV SOLN
INTRAVENOUS | Status: DC
  Administered 2015-09-29: 500 mL via INTRAVENOUS

## 2015-09-29 MED ORDER — MEPERIDINE HCL 100 MG/ML IJ SOLN: 6.2500 mg | INTRAMUSCULAR | Status: DC | PRN

## 2015-09-29 MED ORDER — ONDANSETRON HCL 4 MG/2ML IJ SOLN: 4.0000 mg | Freq: Once | INTRAMUSCULAR | Status: DC | PRN

## 2015-09-29 MED ORDER — PROPOFOL 10 MG/ML IV BOLUS
INTRAVENOUS | Status: DC | PRN
  Administered 2015-09-29 (×5): 30 mg via INTRAVENOUS

## 2015-09-29 MED ORDER — DEXLANSOPRAZOLE 60 MG PO CPDR: 60.0000 mg | DELAYED_RELEASE_CAPSULE | Freq: Every day | ORAL | Status: DC

## 2015-09-29 MED ORDER — PROPOFOL 500 MG/50ML IV EMUL
INTRAVENOUS | Status: DC | PRN
  Administered 2015-09-29: 100 ug/kg/min via INTRAVENOUS

## 2015-09-29 MED ORDER — PROPOFOL 10 MG/ML IV BOLUS
INTRAVENOUS | Status: AC
  Filled 2015-09-29: qty 60

## 2015-09-29 MED ORDER — RANITIDINE HCL 150 MG PO TABS: 150.0000 mg | ORAL_TABLET | Freq: Every day | ORAL | Status: DC

## 2015-09-29 SURGICAL SUPPLY — 14 items

## 2015-09-29 NOTE — Op Note (Signed)
[facility] Patient Name: Roy Dyer Procedure Date: 09/29/2015 MRN: [id number] Attending MD: Jerene Bears , MD Date of Birth: 28-Apr-1966 CSN: [id number] Age: 50 Admit Type: Outpatient Procedure:                Upper GI endoscopy Indications:              Follow-up of reflux esophagitis, Follow-up of                            gastro-esophageal reflux disease Providers:                Lajuan Lines. Hilarie Fredrickson, MD, Vista Lawman, RN, Carolynn Comment, RN, Despina Pole, Technician, Dione Booze, CRNA Referring MD:              Medicines:                Monitored Anesthesia Care Complications:            No immediate complications. Estimated Blood Loss:     Estimated blood loss was minimal. Procedure:                Pre-Anesthesia Assessment:                           - Prior to the procedure, a History and Physical                            was performed, and patient medications and                            allergies were reviewed. The patient's tolerance of                            previous anesthesia was also reviewed. The risks                            and benefits of the procedure and the sedation                            options and risks were discussed with the patient.                            All questions were answered, and informed consent                            was obtained. Prior Anticoagulants: The patient has                            taken Plavix (clopidogrel), last dose was 5 days  prior to procedure. ASA Grade Assessment: III - A                            patient with severe systemic disease. After                            reviewing the risks and benefits, the patient was                            deemed in satisfactory condition to undergo the                            procedure.                           After obtaining informed consent, the endoscope was                          passed under direct vision. Throughout the                            procedure, the patient's blood pressure, pulse, and                            oxygen saturations were monitored continuously. The                            [id number]) scope was introduced through the                            mouth, and advanced to the second part of duodenum.                            The upper GI endoscopy was accomplished without                            difficulty. The patient tolerated the procedure                            fairly well. Scope In: Scope Out: Findings:      LA Grade C (one or more mucosal breaks continuous between tops of 2 or       more mucosal folds, less than 75% circumference) esophagitis with no       bleeding was found 35 to 38 cm from the incisors. This area is also       suspicious for Barrett's esophagus. Biopsies were taken with a cold       forceps for histology.      A 3 cm hiatal hernia was present (diaphragmatic hiatus at 42 cm). Free       reflux noted due the examination.      Scattered mild inflammation characterized by erythema was found at the       incisura and in the gastric antrum. Biopsies were taken with a cold       forceps from the gastric body, antrum and incisura for histology and  Helicobacter pylori testing.      The examined duodenum was normal. Impression:               - LA Grade C reflux esophagitis with possible                            Barrett's esophagus. Biopsied.                           - 3 cm hiatal hernia.                           - Gastritis. Biopsied.                           - Normal examined duodenum. Moderate Sedation:      N/A Recommendation:           - Patient has a contact number available for                            emergencies. The signs and symptoms of potential                            delayed complications were discussed with the                            patient.  Return to normal activities tomorrow.                            Written discharge instructions were provided to the                            patient.                           - Resume previous diet.                           - Continue present medications.                           - Await pathology results.                           - Repeat upper endoscopy for surveillance based on                            pathology results.                           - Add sucralfate suspension 1 gram PO before meals                            and at bedtime.                           - Change PPI to Dexilant 60 mg daily and add  Ranitidine 150 mg in the evening.                           - Follow an antireflux regimen.                           - Do not lie down for at least 3 to 4 hours after                            meals.                           - Raise the head of the bed 4 to 6 inches.                           - Decrease excess weight.                           - Avoid citrus juices and other acidic foods,                            alcohol, chocolate, mints, coffee and other                            caffeinated beverages, carbonated beverages, fatty                            and fried foods.                           - Avoid tight-fitting clothing.                           - Avoid cigarettes and other tobacco products.                           - Office follow-up next available. Procedure Code(s):        --- Professional ---                           [phone], Esophagogastroduodenoscopy, flexible,                            transoral; with biopsy, single or multiple Diagnosis Code(s):        --- Professional ---                           K21.0, Gastro-esophageal reflux disease with                            esophagitis                           K44.9, Diaphragmatic hernia without obstruction or                            gangrene  K29.70, Gastritis, unspecified, without bleeding CPT copyright 2016 American Medical Association. All rights reserved. The codes documented in this report are preliminary and upon coder review may  be revised to meet current compliance requirements. Jerene Bears, MD 09/29/2015 11:18:36 AM This report has been signed electronically. Number of Addenda: 0

## 2015-09-29 NOTE — H&P (Signed)
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 His GERD worsened with immunosuppression after renal transplantation. He is taking Nexium 40 mg twice daily. He was seen last month in clinic and no notable change in medical issues.  He has been off Plavix 5 days  Past Medical History  Diagnosis Date  . Lupus ([place])   . CAD (coronary artery disease)      stents RCA/Circ 2001, BMS to LAD 07/2010  . Cardiomyopathy     Ischemic:  02/2011 EF 30-35%.   Single chamber ICD 11/20/10 ([name])  . Gout     s/p renal transplant he was weened off of his allopurinol.  . Avascular necrosis of bone of hip ([place]) 2010 surg    Left hip arthroplasty: from chronic systemic steroids taken for his Lupus  . 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 implantable cardiac defibrillator single chamber     Medtronic (due to low EF)  . Hypertension   . Myocardial infarction [facility]) 2001 and 2012    3 stents and defib placed in July  . GERD (gastroesophageal reflux disease)     Hx of esoph stricture and dilatation  . Hiatal hernia   . End stage renal disease ([place]) 04/24/2011    Secondary to SLE: HD 06/2011-10/2012 (then got renal transplant)  . History of renal transplant 11/10/2012    10/2012 [facility]   . Chronic renal insufficiency, stage II (mild)     GFR 65 ml/min 07/19/15 at local renal f/u (Cr 1.29)  . Post-transplant erythrocytosis     improved with ARB (valsartan)  . AICD (automatic cardioverter/defibrillator) present     Dr. Paschal Dopp follows remotely-yrly checks, Dr. Jonne Ply    Past Surgical History  Procedure Laterality Date  . Stents      05-2010 and 2- in 2010  . Partial hip arthroplasty      left  . Renal biopsy    .  Tympanostomy tube placement  50 yrs old  . Av fistula placement  03/28/2011    Procedure: ARTERIOVENOUS (AV) FISTULA CREATION;  Surgeon: Rosetta Posner, MD;  Location: [place];  Service: Vascular;  Laterality: Left;  Creation of Left radiocephallic cimino fistula  . Cardiac catheterization      08/2010  . US echocardiography  12/2010; 02/2014;08/2014    02/2014 EF still 25-30%, increased PA pressures.  2016 EF 30-35%, sept/inf hypokinesis, mod tricusp regurg  . Cardiovascular stress test  07/2010; 03/2014    03/2014 showed large old infarct and EF 30-35% but no ischemia  . Av fistula placement  04/27/2011    Procedure: ARTERIOVENOUS (AV) FISTULA CREATION;  Surgeon: Rosetta Posner, MD;  Location: [place];  Service: Vascular;  Laterality: Left;  left basilic vein transposition  . Insert / replace / remove pacemaker      medtronic        dr [name]    [place]   icd only  . Total hip arthroplasty  07/09/2011    Procedure: TOTAL HIP ARTHROPLASTY;  Surgeon: Kerin Salen, MD;  Location: [place];  Service: Orthopedics;  Laterality: Right;  . Kidney transplant  10/2012    [facility] nephrologist--Dr. Blair Heys  . Esophagogastroduodenoscopy  02/2009  Done by Dr. Fuller Plan for hematemesis; esophagitis found     (Not in an outpatient encounter)  No Known Allergies  Family History  Problem Relation Age of Onset  . Kidney failure Mother   . Lupus Mother   . Stroke Mother   . Diabetes Mother     type 2  . Heart attack Father     X 7  . Hypertension Father   . Hyperlipidemia Father   . Heart attack Sister 55    X 1  . Hyperlipidemia Sister   . Hypertension Sister   . Heart disease Sister   . Heart attack Paternal Grandfather   . Heart disease Paternal Aunt   . Heart disease Paternal Uncle     Social History  Substance Use Topics  . Smoking status: Former Smoker -- 0.50 packs/day for 30 years    Types: Cigarettes    Quit date: 08/02/2010  . Smokeless tobacco: Never Used  . Alcohol Use: No     ROS: As per history of present illness, otherwise negative  BP 139/84 mmHg  Pulse 79  Temp(Src) 97.7 F (36.5 C) (Oral)  Resp 21  Ht 5\' 7"  (1.702 m)  Wt 209 lb (94.802 kg)  BMI 32.73 kg/m2  SpO2 100% Gen: awake, alert, NAD HEENT: anicteric, op clear CV: RRR, no mrg Pulm: CTA b/l Abd: soft, NT/ND, +BS throughout Ext: no c/c/e Neuro: nonfocal   RELEVANT LABS AND IMAGING: CBC    Component Value Date/Time   WBC 10.4 04/17/2013 0507   RBC 5.25 04/17/2013 0507   HGB 16.0 04/17/2013 0507   HCT 48.7 04/17/2013 0507   PLT 170 04/17/2013 0507   MCV 92.8 04/17/2013 0507   MCH 30.5 04/17/2013 0507   MCHC 32.9 04/17/2013 0507   RDW 13.6 04/17/2013 0507   LYMPHSABS 1.2 04/17/2013 0507   MONOABS 1.1* 04/17/2013 0507   EOSABS 0.2 04/17/2013 0507   BASOSABS 0.0 04/17/2013 0507    CMP     Component Value Date/Time   NA 139 04/17/2013 0507   K 4.0 04/17/2013 0507   CL 103 04/17/2013 0507   CO2 24 04/17/2013 0507   GLUCOSE 118* 04/17/2013 0507   BUN 16 04/17/2013 0507   CREATININE 1.50* 04/17/2013 0507   CALCIUM 9.4 04/17/2013 0507   CALCIUM 9.1 04/23/2011 0820   PROT 5.4* 06/27/2011 0949   ALBUMIN 1.9* 07/20/2011 0600   AST 18 06/27/2011 0949   ALT 11 06/27/2011 0949   ALKPHOS 92 06/27/2011 0949   BILITOT 0.7 06/27/2011 0949   GFRNONAA 54* 04/17/2013 0507   GFRAA 63* 04/17/2013 0507    ASSESSMENT/PLAN: 50 yo male with PMH of GERD with esophagitis, 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  1. GERD -- plan upper endoscopy today with monitored anesthesia care. He has been off Plavix 5 days as recommended. The nature of the procedure, as well as the risks, benefits, and alternatives were carefully and thoroughly reviewed with the patient. Ample time for discussion and questions allowed. The patient understood, was satisfied, and agreed to  proceed.

## 2015-09-29 NOTE — Anesthesia Preprocedure Evaluation (Addendum)
Anesthesia Evaluation  Patient identified by MRN, date of birth, ID band Patient awake    Reviewed: Allergy & Precautions, NPO status , Patient's Chart, lab work & pertinent test results, reviewed documented beta blocker date and time   Airway Mallampati: I  TM Distance: >3 FB Neck ROM: Full    Dental no notable dental hx. (+) Teeth Intact   Pulmonary former smoker,    Pulmonary exam normal breath sounds clear to auscultation       Cardiovascular hypertension, Pt. on medications and Pt. on home beta blockers + CAD, + Past MI and + Peripheral Vascular Disease  Normal cardiovascular exam+ Cardiac Defibrillator  Rhythm:Regular Rate:Normal  Ischemic CM LVEF 30-35% MI 2001 Stents RCA & LCx  BMS 2012- LAD AICD    Neuro/Psych Ulnar neuropathy both UE  Neuromuscular disease negative psych ROS   GI/Hepatic hiatal hernia, GERD  Medicated and Controlled,  Endo/Other  Obesity  Renal/GU Renal diseaseHx/o ESRD due to SLE S/P Renal transplant- good renal function     Musculoskeletal Hx/o AVN hip SLE   Abdominal (+) + obese,   Peds  Hematology Hx/o LV thrombus- resolved Hx/o Erythrocytosis- resolved on ARB   Anesthesia Other Findings LUE AV fistula with good thrill  Reproductive/Obstetrics                          Anesthesia Physical Anesthesia Plan  ASA: III  Anesthesia Plan: General   Post-op Pain Management:    Induction: Intravenous  Airway Management Planned: Natural Airway and Nasal Cannula  Additional Equipment:   Intra-op Plan:   Post-operative Plan:   Informed Consent: I have reviewed the patients History and Physical, chart, labs and discussed the procedure including the risks, benefits and alternatives for the proposed anesthesia with the patient or authorized representative who has indicated his/her understanding and acceptance.     Plan Discussed with: Anesthesiologist, CRNA  and Surgeon  Anesthesia Plan Comments:         Anesthesia Quick Evaluation

## 2015-09-29 NOTE — Discharge Instructions (Signed)

## 2015-09-29 NOTE — Transfer of Care (Signed)
Immediate Anesthesia Transfer of Care Note  Patient: Roy Dyer  Procedure(s) Performed: Procedure(s): ESOPHAGOGASTRODUODENOSCOPY (EGD) WITH PROPOFOL (N/A)  Patient Location: PACU and Endoscopy Unit  Anesthesia Type:MAC  Level of Consciousness: awake and patient cooperative  Airway & Oxygen Therapy: Patient Spontanous Breathing and Patient connected to nasal cannula oxygen  Post-op Assessment: Report given to RN and Post -op Vital signs reviewed and stable  Post vital signs: Reviewed and stable  Last Vitals:  Filed Vitals:   09/29/15 0930  BP: 139/84  Pulse: 79  Temp: 36.5 C  Resp: 21    Last Pain: There were no vitals filed for this visit.       Complications: No apparent anesthesia complications

## 2015-09-29 NOTE — Anesthesia Postprocedure Evaluation (Signed)
Anesthesia Post Note  Patient: Roy Dyer  Procedure(s) Performed: Procedure(s) (LRB): ESOPHAGOGASTRODUODENOSCOPY (EGD) WITH PROPOFOL (N/A)  Patient location during evaluation: PACU Anesthesia Type: MAC Level of consciousness: awake and alert and oriented Pain management: pain level controlled Vital Signs Assessment: post-procedure vital signs reviewed and stable Respiratory status: spontaneous breathing, nonlabored ventilation and respiratory function stable Cardiovascular status: blood pressure returned to baseline and stable Postop Assessment: no signs of nausea or vomiting Anesthetic complications: no    Last Vitals:  Filed Vitals:   09/29/15 1130 09/29/15 1135  BP: 157/65   Pulse:  67  Temp:    Resp:  19    Last Pain: There were no vitals filed for this visit.               Rethel Sebek A.

## 2015-09-30 ENCOUNTER — Encounter (HOSPITAL_COMMUNITY): Payer: Self-pay | Admitting: Internal Medicine

## 2015-09-30 NOTE — Progress Notes (Signed)
Remote ICD transmission.   

## 2015-10-19 ENCOUNTER — Encounter: Payer: Self-pay | Admitting: Cardiology

## 2015-10-21 LAB — CUP PACEART REMOTE DEVICE CHECK
Battery Voltage: 3.03 V
Date Time Interrogation Session: 20170518111837
HighPow Impedance: 304 Ohm
HighPow Impedance: 53 Ohm
HighPow Impedance: 72 Ohm
Implantable Lead Implant Date: 20120709
Implantable Lead Location: 753860
Implantable Lead Model: 6947
Lead Channel Setting Pacing Amplitude: 3 V
Lead Channel Setting Pacing Pulse Width: 0.4 ms
Lead Channel Setting Sensing Sensitivity: 0.3 mV
MDC IDC MSMT LEADCHNL RV IMPEDANCE VALUE: 361 Ohm
MDC IDC MSMT LEADCHNL RV PACING THRESHOLD AMPLITUDE: 1.375 V
MDC IDC MSMT LEADCHNL RV PACING THRESHOLD PULSEWIDTH: 0.4 ms
MDC IDC MSMT LEADCHNL RV SENSING INTR AMPL: 13.625 mV
MDC IDC MSMT LEADCHNL RV SENSING INTR AMPL: 13.625 mV
MDC IDC STAT BRADY RV PERCENT PACED: 0.03 %

## 2015-11-01 ENCOUNTER — Other Ambulatory Visit: Payer: Self-pay | Admitting: Family Medicine

## 2015-11-01 NOTE — Telephone Encounter (Signed)
RF request for prednisone LOV: 09/12/15 Next ov: 03/14/16 Last written: 09/02/14 #90 w/ 4RF  Please advise. Thanks.   RF request for valsartan - Rx sent.  Last written: 02/02/15 #90 w/ 3RF

## 2015-11-08 ENCOUNTER — Encounter: Payer: Self-pay | Admitting: *Deleted

## 2015-12-12 ENCOUNTER — Encounter: Payer: Self-pay | Admitting: Internal Medicine

## 2015-12-12 ENCOUNTER — Ambulatory Visit (INDEPENDENT_AMBULATORY_CARE_PROVIDER_SITE_OTHER): Payer: Managed Care, Other (non HMO) | Admitting: Internal Medicine

## 2015-12-12 VITALS — BP 128/70 | HR 68 | Ht 66.75 in | Wt 211.0 lb

## 2015-12-12 DIAGNOSIS — K2100 Gastro-esophageal reflux disease with esophagitis, without bleeding: Secondary | ICD-10-CM

## 2015-12-12 DIAGNOSIS — K2270 Barrett's esophagus without dysplasia: Secondary | ICD-10-CM

## 2015-12-12 MED ORDER — RANITIDINE HCL 150 MG PO TABS: 150.0000 mg | ORAL_TABLET | Freq: Every day | ORAL | 3 refills | Status: DC

## 2015-12-12 MED ORDER — DEXLANSOPRAZOLE 60 MG PO CPDR: 60.0000 mg | DELAYED_RELEASE_CAPSULE | Freq: Every day | ORAL | 3 refills | Status: DC

## 2015-12-12 MED ORDER — SUCRALFATE 1 GM/10ML PO SUSP: 1.0000 g | Freq: Three times a day (TID) | ORAL | 3 refills | Status: DC

## 2015-12-12 NOTE — Patient Instructions (Signed)
We have sent the following prescriptions to your mail in pharmacy: Dexilant Ranitidine Carafate  If you have not heard from your mail in pharmacy within 1 week or if you have not received your medication in the mail, please contact us at [phone] so we may find out why.  Please follow up in February or March 2018 to discuss endoscopy/colonoscopy.  If you are age 49 or older, your body mass index should be between 23-30. Your Body mass index is 33.3 kg/m. If this is out of the aforementioned range listed, please consider follow up with your Primary Care Provider.  If you are age 76 or younger, your body mass index should be between 19-25. Your Body mass index is 33.3 kg/m. If this is out of the aformentioned range listed, please consider follow up with your Primary Care Provider.

## 2015-12-12 NOTE — Progress Notes (Signed)
Subjective:    Patient ID: Roy Dyer, male    DOB: 1965-10-20, 50 y.o.   MRN: [id number]  HPI Roy Dyer is a 50 year old male with a history of GERD with reflux esophagitis, Barrett's esophagus, lupus nephropathy status post renal transplant in 2014, cardiomyopathy with ICD, hypertension and gout who is here for follow-up. He was initially seen in the office in April 2017 and then came for upper endoscopy performed on 09/29/2015. This revealed grade C reflux esophagitis with Barrett's change. Biopsies were performed. There was a 3 cm hiatal hernia. There was mild gastritis but a normal examined duodenum. Esophagus biopsy showed intestinal metaplasia without dysplasia. Gastric biopsies showed mild chronic gastritis and hyperemia without H. pylori or metaplasia. After that visit he was placed on Dexilant 60 mg daily and ranitidine 150 mg in the evening. He did take liquid Carafate for about a month.  He reports it took 1-2 weeks for improvement but slowly his reflux symptoms improved. He feels that they are currently well controlled. He still has some regurgitation but this is less ascitic and does not burn. He denies dysphagia or odynophagia. Good appetite with stable weight. No nausea or vomiting. He's had less episodes of waking up with choking and coughing at night. He continues Plavix and his antirejection medication regimen of mycophenolate and tacrolimus. He reports bowel habits is regular without blood in his stool or melena. No diarrhea or constipation. He denies abdominal pain.   Review of Systems As per history of present illness, otherwise negative  Current Medications, Allergies, Past Medical History, Past Surgical History, Family History and Social History were reviewed in [employer] record.     Objective:   Physical Exam BP 128/70 (BP Location: Right Arm, Patient Position: Sitting, Cuff Size: Normal)   Pulse 68   Ht 5' 6.75" (1.695 m) Comment: height  measured without shoes  Wt 211 lb (95.7 kg)   BMI 33.30 kg/m  Constitutional: Well-developed and well-nourished. No distress. HEENT: Normocephalic and atraumatic.Conjunctivae are normal.  No scleral icterus. Neck: Neck supple. Trachea midline. Cardiovascular: Normal rate, regular rhythm and intact distal pulses. No M/R/G Pulmonary/chest: Effort normal and breath sounds normal. No wheezing, rales or rhonchi. Abdominal: Soft, nontender, nondistended. Bowel sounds active throughout. There are no masses palpable. No hepatosplenomegaly. Extremities: no clubbing, cyanosis, or edema. Neurological: Alert and oriented to person place and time. Skin: Skin is warm and dry. Psychiatric: Normal mood and affect. Behavior is normal.     Assessment & Plan:  50 year old male with a history of GERD with reflux esophagitis, Barrett's esophagus, lupus nephropathy status post renal transplant in 2014, cardiomyopathy with ICD, hypertension and gout who is here for follow-up.  1. GERD with esophagitis and Barrett's esophagus -- Reflux is now symptomatically controlled with Dexilant 60 mg daily and ranitidine 150 mg in the evening. He has been off of liquid Carafate. I recommended we continue Dexilant 60 mg in the morning and ranitidine 150 mg each evening. I recommended repeat endoscopy in May 2018 to ensure healing of esophagitis and repeat biopsies for Barrett's esophagus. We discussed the risks benefits and alternatives and he wishes to proceed at that time. He questioned whether he should use Carafate the month before his endoscopy to reduce inflammation in order to improve sensitivity of biopsies. I see little downside to this, but if he is not having esophagitis symptoms then this may not be necessary. We will discuss at follow-up. GERD diet recommended  2. CRC screening --  he will be 50 years old in late 2018. We'll likely proceed to screening colonoscopy at the time of his surveillance endoscopy around May  2018. We will discuss this at follow-up  I asked that he notify me if he has recurrent esophagitis symptoms such as breakthrough heartburn or trouble swallowing before follow-up. He voices understanding 25 minutes spent with the patient today. Greater than 50% was spent in counseling and coordination of care with the patient

## 2015-12-13 ENCOUNTER — Encounter: Payer: Self-pay | Admitting: Family Medicine

## 2015-12-21 ENCOUNTER — Encounter: Payer: Self-pay | Admitting: Family Medicine

## 2016-01-11 NOTE — Progress Notes (Signed)
Electrophysiology Office Note Date: 01/12/2016  ID:  Roy Dyer, Roy Dyer September 04, 1965, MRN [id number]  PCP: [name], MD Primary Cardiologist: Johnsie Cancel Electrophysiologist: [name]  CC: Routine ICD follow-up  Roy Dyer is a 50 y.o. male seen today for Dr [name].  He presents today for routine electrophysiology followup.  Since last being seen in our clinic, the patient reports doing very well. He has occasional palpitations that last seconds and are similar to feeling when VVI pacing.  They occur at different times during the day and are not associated with specific triggers. He denies chest pain, dyspnea, PND, orthopnea, nausea, vomiting, dizziness, syncope, edema, weight gain, or early satiety.  He has not had ICD shocks.   Device History: MDT single chamber ICD implanted 2012 for ICM History of appropriate therapy: No History of AAD therapy: No   Past Medical History:  Diagnosis Date  . AICD (automatic cardioverter/defibrillator) present    Dr. Paschal Dopp follows remotely-yrly checks, Dr. Jonne Ply  . Avascular necrosis of bone of hip ([place]) 2010 surg   Left hip arthroplasty: from chronic systemic steroids taken for his Lupus  . Barrett's esophagus   . CAD (coronary artery disease)     stents RCA/Circ 2001, BMS to LAD 07/2010  . Cardiomyopathy    Ischemic:  02/2011 EF 30-35%.   Single chamber ICD 11/20/10 ([name])  . Chronic renal insufficiency, stage II (mild)    GFR 65 ml/min 07/19/15 at local renal f/u (Cr 1.29)  . End stage renal disease ([place]) 04/24/2011   Secondary to SLE: HD 06/2011-10/2012 (then got renal transplant)  . GERD (gastroesophageal reflux disease)    Hx of esoph stricture and dilatation.  +Barrett's esophagus+  . Gout    s/p renal transplant he was weened off of his allopurinol.  . Hiatal hernia   . History of renal transplant 11/10/2012   10/2012 [facility]   . Hypertension   . implantable cardiac defibrillator single chamber    Medtronic (due to low EF)    . 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 Lupus ([place])   . Myocardial infarction [facility]) 2001 and 2012   3 stents and defib placed in July  . Post-transplant erythrocytosis    improved with ARB (valsartan)   Past Surgical History:  Procedure Laterality Date  . AV FISTULA PLACEMENT  03/28/2011   Procedure: ARTERIOVENOUS (AV) FISTULA CREATION;  Surgeon: Rosetta Posner, MD;  Location: [place];  Service: Vascular;  Laterality: Left;  Creation of Left radiocephallic cimino fistula  . AV FISTULA PLACEMENT  04/27/2011   Procedure: ARTERIOVENOUS (AV) FISTULA CREATION;  Surgeon: Rosetta Posner, MD;  Location: [place];  Service: Vascular;  Laterality: Left;  left basilic vein transposition  . CARDIAC CATHETERIZATION     08/2010  . CARDIOVASCULAR STRESS TEST  07/2010; 03/2014   03/2014 showed large old infarct and EF 30-35% but no ischemia  . ESOPHAGOGASTRODUODENOSCOPY  02/2009   Done by Dr. Fuller Plan for hematemesis; esophagitis found.  Repeat recommended 09/2016, at which time he will also likely get his initial screening colonoscopy.  . ESOPHAGOGASTRODUODENOSCOPY (EGD) WITH PROPOFOL N/A 09/29/2015   REPEAT EGD RECOMMENDED 09/2016.  Barrett's esoph + mild chronic gastritis.  H pylori NEG. Procedure: ESOPHAGOGASTRODUODENOSCOPY (EGD) WITH PROPOFOL;  Surgeon: Jerene Bears, MD;  Location: [facility];  Service: Gastroenterology;  Laterality: N/A;  . INSERT / REPLACE / REMOVE PACEMAKER  medtronic        dr [name]    [place]   icd only  . KIDNEY TRANSPLANT  10/2012   [facility] nephrologist--Dr. Blair Heys  . PARTIAL HIP ARTHROPLASTY     left  . RENAL BIOPSY    . stents     05-2010 and 2- in 2010  . TOTAL HIP ARTHROPLASTY  07/09/2011   Procedure: TOTAL HIP ARTHROPLASTY;  Surgeon: Kerin Salen, MD;  Location: [place];  Service: Orthopedics;  Laterality: Right;  . TYMPANOSTOMY TUBE PLACEMENT  50 yrs old  . US ECHOCARDIOGRAPHY   12/2010; 02/2014;08/2014   02/2014 EF still 25-30%, increased PA pressures.  2016 EF 30-35%, sept/inf hypokinesis, mod tricusp regurg    Current Outpatient Prescriptions  Medication Sig Dispense Refill  . acetaminophen (TYLENOL) 500 MG tablet Take 1,000-1,500 mg by mouth every 6 (six) hours as needed for headache.    . allopurinol (ZYLOPRIM) 300 MG tablet Take 0.5 tablets (150 mg total) by mouth daily. 45 tablet 4  . aspirin EC 81 MG tablet Take 81 mg by mouth daily.    [name] atorvastatin (LIPITOR) 80 MG tablet TAKE 1 TABLET BY MOUTH DAILY 90 tablet 3  . BYSTOLIC 5 MG tablet TAKE 1 TABLET BY MOUTH DAILY 90 tablet 3  . Cholecalciferol (VITAMIN D3) 5000 units CAPS TAKE 1 CAPSULE BY MOUTH DAILY (Patient taking differently: TAKE 1 CAPSULE BY MOUTH tuesday, thursday, saturday, sunday) 100 capsule 2  . clopidogrel (PLAVIX) 75 MG tablet TAKE 1 TABLET BY MOUTH DAILY 90 tablet 3  . dexlansoprazole (DEXILANT) 60 MG capsule Take 1 capsule (60 mg total) by mouth daily before breakfast. 90 capsule 3  . magnesium oxide (MAG-OX) 400 MG tablet Take 400 mg by mouth daily.     . mycophenolate (CELLCEPT) 250 MG capsule Take 750 mg by mouth 2 (two) times daily.    . niacin (NIASPAN) 500 MG CR tablet TAKE 2 TABLETS BY MOUTH AT BEDTIME 180 tablet 3  . NITROSTAT 0.4 MG SL tablet USE 1 TAB UNDER TONGUE EVERY 5 MINUTES UP TO 3 DOSES AS NEEDED (Patient taking differently: USE 1 TAB UNDER TONGUE EVERY 5 MINUTES UP TO 3 DOSES AS NEEDED FOR CHEST PAIN.) 25 tablet 2  . predniSONE (DELTASONE) 5 MG tablet TAKE 1 TABLET BY MOUTH DAILY 90 tablet 3  . ranitidine (ZANTAC) 150 MG tablet Take 1 tablet (150 mg total) by mouth at bedtime. 90 tablet 3  . sucralfate (CARAFATE) 1 GM/10ML suspension Take 10 mLs (1 g total) by mouth 4 (four) times daily -  before meals and at bedtime. As needed 2500 mL 3  . tacrolimus (PROGRAF) 0.5 MG capsule Take 0.5-1 mg by mouth See admin instructions. Take one capsule twice daily on Tuesday, Thursday,  Saturday, Sunday and two capsules in the morning and one capsule in the evening on Monday, Wednesday, Friday.    . valsartan (DIOVAN) 40 MG tablet TAKE 1 TABLET BY MOUTH DAILY 90 tablet 3  . zolpidem (AMBIEN) 5 MG tablet Take 5 mg by mouth at bedtime as needed for sleep.     No current facility-administered medications for this visit.     Allergies:   Review of patient's allergies indicates no known allergies.   Social History: Social History   Social History  . Marital status: Married    Spouse name: N/A  . Number of children: N/A  . Years of education: N/A   Occupational History  . CAD Drafter  [employer]  Social History Main Topics  . Smoking status: Former Smoker    Packs/day: 0.50    Years: 30.00    Types: Cigarettes    Quit date: 08/02/2010  . Smokeless tobacco: Never Used  . Alcohol use No  . Drug use: No  . Sexual activity: Yes    Partners: Female   Other Topics Concern  . Not on file   Social History Narrative   Married, 1 teenage son and 1 teenage daughter.    Occupation: Printmaker.   15 pack-yr smoking hx, quit 07/2010.   Drug Use - no   No alcohol.             Family History: Family History  Problem Relation Age of Onset  . Kidney failure Mother   . Lupus Mother   . Stroke Mother   . Diabetes Mother     type 2  . Heart attack Father     X 7  . Hypertension Father   . Hyperlipidemia Father   . Heart attack Sister 78    X 1  . Hyperlipidemia Sister   . Hypertension Sister   . Heart disease Sister   . Heart attack Paternal Grandfather   . Heart disease Paternal Aunt   . Heart disease Paternal Uncle     Review of Systems: All other systems reviewed and are otherwise negative except as noted above.   Physical Exam: VS:  BP (!) 142/84   Pulse 66   Ht 5\' 7"  (1.702 m)   Wt 209 lb (94.8 kg)   BMI 32.73 kg/m  , BMI Body mass index is 32.73 kg/m.  GEN- The patient is well appearing, alert and oriented x 3 today.   HEENT:  normocephalic, atraumatic; sclera clear, conjunctiva pink; hearing intact; oropharynx clear; neck supple Lungs- Clear to ausculation bilaterally, normal work of breathing.  No wheezes, rales, rhonchi Heart- Regular rate and rhythm GI- soft, non-tender, non-distended, bowel sounds present Extremities- no clubbing, cyanosis, or edema MS- no significant deformity or atrophy Skin- warm and dry, no rash or lesion; ICD pocket well healed Psych- euthymic mood, full affect Neuro- strength and sensation are intact  ICD interrogation- reviewed in detail today,  See PACEART report  EKG:  EKG is ordered today. The ekg ordered today shows sinus rhythm, rate 66  Recent Labs: No results found for requested labs within last 8760 hours.   Wt Readings from Last 3 Encounters:  01/12/16 209 lb (94.8 kg)  12/12/15 211 lb (95.7 kg)  09/29/15 209 lb (94.8 kg)     Other studies Reviewed: Additional studies/ records that were reviewed today include: Dr [name] and Dr Kyla Balzarine office notes  Assessment and Plan:  1.  Chronic systolic dysfunction euvolemic today Stable on an appropriate medical regimen Normal ICD function See Pace Art report No changes today  2.  CAD No recent ischemic symptoms Continue medical therapy  3.  S/p renal transplant Followed by [facility] with stable renal function   4.  Palpitations No arrhythmias on device interrogation PVC burden low I think this is probably occasional PVC's Pt reassured today   Current medicines are reviewed at length with the patient today.   The patient does not have concerns regarding his medicines.  The following changes were made today:  none  Labs/ tests ordered today include:  Orders Placed This Encounter  Procedures  . EKG 12-Lead     Disposition:   Follow up with [facility], Dr [name] 1 year  Signed, Chanetta Marshall, NP 01/12/2016 12:14 PM  [place] [address] [place]  [id number] [phone] (office) [phone] (fax

## 2016-01-12 ENCOUNTER — Encounter: Payer: Self-pay | Admitting: Nurse Practitioner

## 2016-01-12 ENCOUNTER — Other Ambulatory Visit: Payer: Self-pay | Admitting: *Deleted

## 2016-01-12 ENCOUNTER — Ambulatory Visit (INDEPENDENT_AMBULATORY_CARE_PROVIDER_SITE_OTHER): Payer: Managed Care, Other (non HMO) | Admitting: Nurse Practitioner

## 2016-01-12 VITALS — BP 142/84 | HR 66 | Ht 67.0 in | Wt 209.0 lb

## 2016-01-12 DIAGNOSIS — I25810 Atherosclerosis of coronary artery bypass graft(s) without angina pectoris: Secondary | ICD-10-CM | POA: Diagnosis not present

## 2016-01-12 DIAGNOSIS — I5022 Chronic systolic (congestive) heart failure: Secondary | ICD-10-CM | POA: Diagnosis not present

## 2016-01-12 DIAGNOSIS — Z940 Kidney transplant status: Secondary | ICD-10-CM

## 2016-01-12 DIAGNOSIS — I420 Dilated cardiomyopathy: Secondary | ICD-10-CM

## 2016-01-12 NOTE — Patient Instructions (Addendum)
Medication Instructions:  Your physician recommends that you continue on your current medications as directed. Please refer to the Current Medication list given to you today.   Labwork: None ordered  Testing/Procedures: None rdered  Follow-Up: Your physician recommends that you schedule a follow-up appointment in: [place]  Your physician wants you to follow-up in: [place] will receive a reminder letter in the mail two months in advance. If you don't receive a letter, please call our office to schedule the follow-up appointment.  Remote monitoring is used to monitor your Pacemaker of ICD from home. This monitoring reduces the number of office visits required to check your device to one time per year. It allows [place] to keep an eye on the functioning of your device to ensure it is working properly. You are scheduled for a device check from home on 1 YEAR   You may send your transmission at any time that day. If you have a wireless device, the transmission will be sent automatically. After your physician reviews your transmission, you will receive a postcard with your next transmission date.   Any Other Special Instructions Will Be Listed Below (If Applicable).     If you need a refill on your cardiac medications before your next appointment, please call your pharmacy.

## 2016-01-13 ENCOUNTER — Telehealth: Payer: Self-pay

## 2016-01-13 DIAGNOSIS — I420 Dilated cardiomyopathy: Secondary | ICD-10-CM

## 2016-01-13 LAB — CUP PACEART INCLINIC DEVICE CHECK
Date Time Interrogation Session: 20170901081128
Implantable Lead Implant Date: 20120709
Implantable Lead Location: 753860
Implantable Lead Model: 6947

## 2016-01-13 NOTE — Telephone Encounter (Signed)
-----   Message from Josue Hector, MD sent at 01/12/2016 12:43 PM EDT ----- Echo for ischemic DCM is fine can do same day of visit or in December  ----- Message ----- From: Jeanann Lewandowsky, RMA Sent: 01/12/2016  12:05 PM To: Josue Hector, MD, Michaelyn Barter, RN  Pt coming in 04/2016 to se Dr. Johnsie Cancel.  He said doc always either wants echo or stress test, so pt would like for it to be arranged before his appt so dr. Johnsie Cancel can have results.  Thanks!

## 2016-01-13 NOTE — Telephone Encounter (Signed)
Order placed and scheduling will call patient with an appointment.

## 2016-03-05 ENCOUNTER — Other Ambulatory Visit: Payer: Self-pay

## 2016-03-05 MED ORDER — MYCOPHENOLATE MOFETIL 250 MG PO CAPS: 750.0000 mg | ORAL_CAPSULE | Freq: Two times a day (BID) | ORAL | 0 refills | Status: DC

## 2016-03-05 NOTE — Telephone Encounter (Signed)
Patient requesting a 30 day supply refill at local pharmacy until he can be seen at his appt in 2 weeks.

## 2016-03-14 ENCOUNTER — Ambulatory Visit: Payer: Managed Care, Other (non HMO) | Admitting: Family Medicine

## 2016-03-22 ENCOUNTER — Encounter: Payer: Self-pay | Admitting: Family Medicine

## 2016-03-22 ENCOUNTER — Ambulatory Visit (INDEPENDENT_AMBULATORY_CARE_PROVIDER_SITE_OTHER): Payer: Managed Care, Other (non HMO) | Admitting: Family Medicine

## 2016-03-22 VITALS — BP 118/75 | HR 67 | Temp 97.0°F | Resp 16 | Wt 214.0 lb

## 2016-03-22 DIAGNOSIS — K219 Gastro-esophageal reflux disease without esophagitis: Secondary | ICD-10-CM | POA: Diagnosis not present

## 2016-03-22 DIAGNOSIS — E7800 Pure hypercholesterolemia, unspecified: Secondary | ICD-10-CM

## 2016-03-22 DIAGNOSIS — I10 Essential (primary) hypertension: Secondary | ICD-10-CM | POA: Diagnosis not present

## 2016-03-22 DIAGNOSIS — N182 Chronic kidney disease, stage 2 (mild): Secondary | ICD-10-CM | POA: Diagnosis not present

## 2016-03-22 DIAGNOSIS — G4489 Other headache syndrome: Secondary | ICD-10-CM

## 2016-03-22 MED ORDER — ALLOPURINOL 100 MG PO TABS: 100.0000 mg | ORAL_TABLET | Freq: Every day | ORAL | 3 refills | Status: AC

## 2016-03-22 MED ORDER — GABAPENTIN 100 MG PO CAPS: ORAL_CAPSULE | ORAL | 3 refills | Status: DC

## 2016-03-22 NOTE — Progress Notes (Signed)
OFFICE VISIT  03/22/2016   CC:  Chief Complaint  Patient presents with  . Follow-up    HTN, Reflux   HPI:    Patient is a 50 y.o. Caucasian male who presents for 6 mo f/u Lupus, HTN, HLD, CRI stage 2, GERD, CAD. Reviewed note from [facility] from f/u 02/06/16 and Cr was 1.2, stable.  It was mentioned that other labs were done and all were good but no lab results were included in the note.  Dx'd with Barrett's esophagus since I last saw him, followed by Dr. Hilarie Fredrickson in GI.    Feeling well.  Doing all he can with diet to avoid bad reflux, + elevates head of bed.  See GERD meds below.  HTN: occ home bp check is normal.  Compliant with meds.  HLD: Pt had results of recent lipid panel with him today and it was really good (01/2016).  Still having HA's towards the end of the day, about 5 days a week, says it feels like a sinus thing, describes pain on R side behind eye and extending to R ear and R side of head region.  No nausea, no photo/phonophobia.  No tearing or eye drooping.  He had his eyes checked and no probs found.  Sometimes wakes up in mornings and has the HA.  Takes 3-4 tylenol tabs ! And it will take care of it.   Constant ache, not sharp or stabbing.  Feels constant mild nasal congestion.  No drainage or ST.     Past Medical History:  Diagnosis Date  . AICD (automatic cardioverter/defibrillator) present    Dr. Paschal Dopp follows remotely-yrly checks, Dr. Jonne Ply  . Avascular necrosis of bone of hip ([place]) 2010 surg   Left hip arthroplasty: from chronic systemic steroids taken for his Lupus  . Barrett's esophagus   . CAD (coronary artery disease)     stents RCA/Circ 2001, BMS to LAD 07/2010  . Cardiomyopathy    (Ischemic):  Chronic systolic dysfunction--  16/1096 EF 30-35%.   Single chamber ICD 11/20/10 ([name])  . Chronic renal insufficiency, stage II (mild)    GFR 65 ml/min 07/19/15 at local renal f/u (Cr 1.29)  . End stage renal disease ([place]) 04/24/2011   Secondary to SLE: HD 06/2011-10/2012 (then got renal transplant)  . GERD (gastroesophageal reflux disease)    Hx of esoph stricture and dilatation.  +Barrett's esophagus+  . Gout    s/p renal transplant he was weened off of his allopurinol.  . Hiatal hernia   . History of renal transplant 11/10/2012   10/2012 [facility]   . Hypertension   . implantable cardiac defibrillator single chamber    Medtronic (due to low EF)  . Left ventricular thrombus 2012   Re-eval 02/2011 showed thrombus RESOLVED, so coumadin was d/c'd (was on it for 75mo)  . Lupus   . Myocardial infarction 2001 and 2012   3 stents and defib placed in July  . Post-transplant erythrocytosis    improved with ARB (valsartan)    Past Surgical History:  Procedure Laterality Date  . AV FISTULA PLACEMENT  03/28/2011   Procedure: ARTERIOVENOUS (AV) FISTULA CREATION;  Surgeon: Rosetta Posner, MD;  Location: [place];  Service: Vascular;  Laterality: Left;  Creation of Left radiocephallic cimino fistula  . AV FISTULA PLACEMENT  04/27/2011   Procedure: ARTERIOVENOUS (AV) FISTULA CREATION;  Surgeon: Rosetta Posner, MD;  Location: [place];  Service: Vascular;  Laterality: Left;  left basilic vein transposition  .  CARDIAC CATHETERIZATION     08/2010  . CARDIOVASCULAR STRESS TEST  07/2010; 03/2014   03/2014 showed large old infarct and EF 30-35% but no ischemia  . ESOPHAGOGASTRODUODENOSCOPY  02/2009   Done by Dr. Fuller Plan for hematemesis; esophagitis found.  Repeat recommended 09/2016, at which time he will also likely get his initial screening colonoscopy.  . ESOPHAGOGASTRODUODENOSCOPY (EGD) WITH PROPOFOL N/A 09/29/2015   REPEAT EGD RECOMMENDED 09/2016.  Barrett's esoph + mild chronic gastritis.  H pylori NEG. Procedure: ESOPHAGOGASTRODUODENOSCOPY (EGD) WITH PROPOFOL;  Surgeon: Jerene Bears, MD;  [facility];  Service: Gastroenterology;  Laterality: N/A;  . INSERT / REPLACE / REMOVE PACEMAKER     medtronic        dr [name]    [place]   icd only  .  KIDNEY TRANSPLANT  10/2012   [facility] nephrologist--Dr. Blair Heys  . PARTIAL HIP ARTHROPLASTY     left  . RENAL BIOPSY    . stents     05-2010 and 2- in 2010  . TOTAL HIP ARTHROPLASTY  07/09/2011   Procedure: TOTAL HIP ARTHROPLASTY;  Surgeon: Kerin Salen, MD;  Location: [place];  Service: Orthopedics;  Laterality: Right;  . TYMPANOSTOMY TUBE PLACEMENT  50 yrs old  . US ECHOCARDIOGRAPHY  12/2010; 02/2014;08/2014   02/2014 EF still 25-30%, increased PA pressures.  2016 EF 30-35%, sept/inf hypokinesis, mod tricusp regurg    Outpatient Medications Prior to Visit  Medication Sig Dispense Refill  . acetaminophen (TYLENOL) 500 MG tablet Take 1,000-1,500 mg by mouth every 6 (six) hours as needed for headache.    [name] aspirin EC 81 MG tablet Take 81 mg by mouth daily.    [name] atorvastatin (LIPITOR) 80 MG tablet TAKE 1 TABLET BY MOUTH DAILY 90 tablet 3  . BYSTOLIC 5 MG tablet TAKE 1 TABLET BY MOUTH DAILY 90 tablet 3  . Cholecalciferol (VITAMIN D3) 5000 units CAPS TAKE 1 CAPSULE BY MOUTH DAILY (Patient taking differently: TAKE 1 CAPSULE BY MOUTH tuesday, thursday, saturday, sunday) 100 capsule 2  . clopidogrel (PLAVIX) 75 MG tablet TAKE 1 TABLET BY MOUTH DAILY 90 tablet 3  . dexlansoprazole (DEXILANT) 60 MG capsule Take 1 capsule (60 mg total) by mouth daily before breakfast. 90 capsule 3  . magnesium oxide (MAG-OX) 400 MG tablet Take 400 mg by mouth daily.     . mycophenolate (CELLCEPT) 250 MG capsule Take 3 capsules (750 mg total) by mouth 2 (two) times daily. 180 capsule 0  . niacin (NIASPAN) 500 MG CR tablet TAKE 2 TABLETS BY MOUTH AT BEDTIME 180 tablet 3  . NITROSTAT 0.4 MG SL tablet USE 1 TAB UNDER TONGUE EVERY 5 MINUTES UP TO 3 DOSES AS NEEDED (Patient taking differently: USE 1 TAB UNDER TONGUE EVERY 5 MINUTES UP TO 3 DOSES AS NEEDED FOR CHEST PAIN.) 25 tablet 2  . predniSONE (DELTASONE) 5 MG tablet TAKE 1 TABLET BY MOUTH DAILY 90 tablet 3  . ranitidine (ZANTAC) 150 MG tablet Take 1 tablet (150 mg  total) by mouth at bedtime. 90 tablet 3  . sucralfate (CARAFATE) 1 GM/10ML suspension Take 10 mLs (1 g total) by mouth 4 (four) times daily -  before meals and at bedtime. As needed 2500 mL 3  . tacrolimus (PROGRAF) 0.5 MG capsule Take 0.5-1 mg by mouth See admin instructions. Take one capsule twice daily on Tuesday, Thursday, Saturday and two capsules in the morning and one capsule in the evening on Sunday,  Monday, Wednesday, Friday.    [name]  valsartan (DIOVAN) 40 MG tablet TAKE 1 TABLET BY MOUTH DAILY 90 tablet 3  . zolpidem (AMBIEN) 5 MG tablet Take 5 mg by mouth at bedtime as needed for sleep.    [name] allopurinol (ZYLOPRIM) 300 MG tablet Take 0.5 tablets (150 mg total) by mouth daily. 45 tablet 4   No facility-administered medications prior to visit.     No Known Allergies  ROS As per HPI  PE: Blood pressure 118/75, pulse 67, temperature 97 F (36.1 C), temperature source Temporal, resp. rate 16, weight 214 lb (97.1 kg), SpO2 97 %. Gen: Alert, well appearing.  Patient is oriented to person, place, time, and situation. AFFECT: pleasant, lucid thought and speech. ENT: Ears: EACs clear, normal epithelium.  TMs with good light reflex and landmarks bilaterally.  Eyes: no injection, icteris, swelling, or exudate.  EOMI, PERRLA. Nose: no drainage or turbinate edema/swelling.  No injection or focal lesion.  Mouth: lips without lesion/swelling.  Oral mucosa pink and moist.  Dentition intact and without obvious caries or gingival swelling.  Oropharynx without erythema, exudate, or swelling.  No paranasal or frontal sinus TTP. No TA tenderness.  No TMJ tenderness or excess joint laxity. Neck - No masses or thyromegaly or limitation in range of motion   LABS:  Lab Results  Component Value Date   TSH 0.52 09/03/2013   Lab Results  Component Value Date   WBC 10.4 04/17/2013   HGB 16.0 04/17/2013   HCT 48.7 04/17/2013   MCV 92.8 04/17/2013   PLT 170 04/17/2013   Lab Results  Component Value  Date   CREATININE 1.50 (H) 04/17/2013   BUN 16 04/17/2013   NA 139 04/17/2013   K 4.0 04/17/2013   CL 103 04/17/2013   CO2 24 04/17/2013   Lab Results  Component Value Date   ALT 11 06/27/2011   AST 18 06/27/2011   ALKPHOS 92 06/27/2011   BILITOT 0.7 06/27/2011   Lab Results  Component Value Date   CHOL 127 03/22/2015   Lab Results  Component Value Date   HDL 60.50 03/22/2015   Lab Results  Component Value Date   LDLCALC 53 03/22/2015   Lab Results  Component Value Date   TRIG 70.0 03/22/2015   Lab Results  Component Value Date   CHOLHDL 2 03/22/2015   Lab Results  Component Value Date   PSA 1.71 10/23/2010   Lab Results  Component Value Date   HGBA1C 5.6 04/23/2011   IMPRESSION AND PLAN:  1) Lupus: quiescent.  2) HTN: The current medical regimen is effective;  continue present plan and medications. Lytes/cr stable at nephrologist's 01/2016--reviewed these today on pt's phone but will ask neph office for copies for placement in chart.  3) HLD: recent FLP 01/2016 really good.  Tolerating statin.  Hepatic panel normal 01/2016.  4) CRI stage II/hx of kidney trasplant: 01/2016 Scr stable at 1.2 (GFR in the 70s).  Continue routine f/u with local nephrologist.  5) GERD: has Barrett's esophagus.  Continue current meds, diet, and GI f/u as appropriate.  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 Will start trial of gabapentin 100, 1 qhs and titrate q5d by 1 tab to max of 3 tabs qhs. I'll recheck him for this in 71mo.  He'll continue to use tylenol 1000 mg prn HA.  An After Visit Summary was printed and given to the patient.  FOLLOW UP: Return in about 4 weeks (around 04/19/2016) for f/u HA's.  Signed:  Crissie Sickles, MD  03/22/2016     

## 2016-03-22 NOTE — Progress Notes (Signed)
Pre visit review using our clinic review tool, if applicable. No additional management support is needed unless otherwise documented below in the visit note. 

## 2016-04-12 ENCOUNTER — Ambulatory Visit (INDEPENDENT_AMBULATORY_CARE_PROVIDER_SITE_OTHER): Payer: Managed Care, Other (non HMO) | Admitting: *Deleted

## 2016-04-12 DIAGNOSIS — I255 Ischemic cardiomyopathy: Secondary | ICD-10-CM | POA: Diagnosis not present

## 2016-04-12 NOTE — Progress Notes (Signed)
Remote ICD transmission.   

## 2016-04-17 ENCOUNTER — Encounter: Payer: Self-pay | Admitting: Internal Medicine

## 2016-04-17 DIAGNOSIS — D899 Disorder involving the immune mechanism, unspecified: Secondary | ICD-10-CM

## 2016-04-17 DIAGNOSIS — D849 Immunodeficiency, unspecified: Secondary | ICD-10-CM | POA: Insufficient documentation

## 2016-04-17 DIAGNOSIS — K2270 Barrett's esophagus without dysplasia: Secondary | ICD-10-CM | POA: Insufficient documentation

## 2016-04-18 ENCOUNTER — Other Ambulatory Visit: Payer: Self-pay | Admitting: Family Medicine

## 2016-04-18 ENCOUNTER — Ambulatory Visit (INDEPENDENT_AMBULATORY_CARE_PROVIDER_SITE_OTHER): Payer: Managed Care, Other (non HMO) | Admitting: Family Medicine

## 2016-04-18 ENCOUNTER — Encounter: Payer: Self-pay | Admitting: Family Medicine

## 2016-04-18 VITALS — BP 120/70 | HR 62 | Temp 97.9°F | Resp 16 | Ht 67.0 in | Wt 218.0 lb

## 2016-04-18 DIAGNOSIS — G4489 Other headache syndrome: Secondary | ICD-10-CM | POA: Diagnosis not present

## 2016-04-18 DIAGNOSIS — D849 Immunodeficiency, unspecified: Secondary | ICD-10-CM

## 2016-04-18 DIAGNOSIS — D899 Disorder involving the immune mechanism, unspecified: Secondary | ICD-10-CM | POA: Diagnosis not present

## 2016-04-18 DIAGNOSIS — R519 Headache, unspecified: Secondary | ICD-10-CM

## 2016-04-18 DIAGNOSIS — G8929 Other chronic pain: Secondary | ICD-10-CM

## 2016-04-18 DIAGNOSIS — R51 Headache: Principal | ICD-10-CM

## 2016-04-18 LAB — BASIC METABOLIC PANEL
BUN: 17 mg/dL (ref 6–23)
CHLORIDE: 104 meq/L (ref 96–112)
CO2: 33 meq/L — AB (ref 19–32)
CREATININE: 1.33 mg/dL (ref 0.40–1.50)
Calcium: 9.6 mg/dL (ref 8.4–10.5)
GFR: 60.72 mL/min (ref >60.00)
Glucose, Bld: 111 mg/dL — ABNORMAL HIGH (ref 70–99)
Potassium: 4.5 mEq/L (ref 3.5–5.1)
Sodium: 142 mEq/L (ref 135–145)

## 2016-04-18 NOTE — Progress Notes (Signed)
OFFICE VISIT  04/18/2016   CC:  Chief Complaint  Patient presents with  . Follow-up    Headaches    HPI:    Patient is a 50 y.o. Caucasian male who presents for 1 mo f/u headaches. 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  No signif change in HA frequency, pattern, or intensity. He saw his [facility] transplant MD yesterday and was told to ask me to order MRI brain for this given that pt is immunosuppressed: r/o atypical infection presentation or mass presentation.  No n/v, no photophobia, no phonophobia, no weakness, no vision c/o or hearing c/o.  No acute complaints today.  Past Medical History:  Diagnosis Date  . AICD (automatic cardioverter/defibrillator) present    Dr. Paschal Dopp follows remotely-yrly checks, Dr. Jonne Ply  . Avascular necrosis of bone of hip ([place]) 2010 surg   Left hip arthroplasty: from chronic systemic steroids taken for his Lupus  . Barrett's esophagus   . CAD (coronary artery disease)     stents RCA/Circ 2001, BMS to LAD 07/2010  . Cardiomyopathy    (Ischemic):  Chronic systolic dysfunction--  40/9735 EF 30-35%.   Single chamber ICD 11/20/10 ([name])  . Chronic renal insufficiency, stage II (mild)    GFR 65 ml/min 07/19/15 at local renal f/u (Cr 1.29)  . End stage renal disease ([place]) 04/24/2011   Secondary to SLE: HD 06/2011-10/2012 (then got renal transplant)  . GERD (gastroesophageal reflux disease)    Hx of esoph stricture and dilatation.  +Barrett's esophagus+  . Gout    s/p renal transplant he was weened off of his allopurinol.  . Hiatal hernia   . History of renal transplant 11/10/2012   10/2012 [facility]   . Hypertension   . implantable cardiac defibrillator single chamber    Medtronic (due to low EF)  . Left ventricular thrombus 2012   Re-eval 02/2011 showed thrombus RESOLVED, so coumadin was d/c'd (was on it for 19mo)  . Lupus   . Myocardial infarction 2001 and 2012   3 stents and defib placed in July  . Post-transplant erythrocytosis     improved with ARB (valsartan)    Past Surgical History:  Procedure Laterality Date  . AV FISTULA PLACEMENT  03/28/2011   Procedure: ARTERIOVENOUS (AV) FISTULA CREATION;  Surgeon: Rosetta Posner, MD;  Location: [place];  Service: Vascular;  Laterality: Left;  Creation of Left radiocephallic cimino fistula  . AV FISTULA PLACEMENT  04/27/2011   Procedure: ARTERIOVENOUS (AV) FISTULA CREATION;  Surgeon: Rosetta Posner, MD;  Location: [place];  Service: Vascular;  Laterality: Left;  left basilic vein transposition  . CARDIAC CATHETERIZATION     08/2010  . CARDIOVASCULAR STRESS TEST  07/2010; 03/2014   03/2014 showed large old infarct and EF 30-35% but no ischemia  . ESOPHAGOGASTRODUODENOSCOPY  02/2009   Done by Dr. Fuller Plan for hematemesis; esophagitis found.  Repeat recommended 09/2016, at which time he will also likely get his initial screening colonoscopy.  . ESOPHAGOGASTRODUODENOSCOPY (EGD) WITH PROPOFOL N/A 09/29/2015   REPEAT EGD RECOMMENDED 09/2016.  Barrett's esoph + mild chronic gastritis.  H pylori NEG. Procedure: ESOPHAGOGASTRODUODENOSCOPY (EGD) WITH PROPOFOL;  Surgeon: Jerene Bears, MD;  [facility];  Service: Gastroenterology;  Laterality: N/A;  . INSERT / REPLACE / REMOVE PACEMAKER     medtronic        dr [name]    [place]   icd only  . KIDNEY TRANSPLANT  10/2012   [facility] nephrologist--Dr. Blair Heys  .  PARTIAL HIP ARTHROPLASTY     left  . RENAL BIOPSY    . stents     05-2010 and 2- in 2010  . TOTAL HIP ARTHROPLASTY  07/09/2011   Procedure: TOTAL HIP ARTHROPLASTY;  Surgeon: Kerin Salen, MD;  Location: [place];  Service: Orthopedics;  Laterality: Right;  . TYMPANOSTOMY TUBE PLACEMENT  50 yrs old  . US ECHOCARDIOGRAPHY  12/2010; 02/2014;08/2014   02/2014 EF still 25-30%, increased PA pressures.  2016 EF 30-35%, sept/inf hypokinesis, mod tricusp regurg    Outpatient Medications Prior to Visit  Medication Sig Dispense Refill  . acetaminophen (TYLENOL) 500 MG tablet Take  1,000-1,500 mg by mouth every 6 (six) hours as needed for headache.    . allopurinol (ZYLOPRIM) 100 MG tablet Take 1 tablet (100 mg total) by mouth daily. 90 tablet 3  . aspirin EC 81 MG tablet Take 81 mg by mouth daily.    [name] atorvastatin (LIPITOR) 80 MG tablet TAKE 1 TABLET BY MOUTH DAILY 90 tablet 3  . BYSTOLIC 5 MG tablet TAKE 1 TABLET BY MOUTH DAILY 90 tablet 3  . Cholecalciferol (VITAMIN D3) 5000 units CAPS TAKE 1 CAPSULE BY MOUTH DAILY (Patient taking differently: TAKE 1 CAPSULE BY MOUTH tuesday, thursday, saturday, sunday) 100 capsule 2  . clopidogrel (PLAVIX) 75 MG tablet TAKE 1 TABLET BY MOUTH DAILY 90 tablet 3  . dexlansoprazole (DEXILANT) 60 MG capsule Take 1 capsule (60 mg total) by mouth daily before breakfast. 90 capsule 3  . gabapentin (NEURONTIN) 100 MG capsule 1-3 tabs po qhs for HA prevention 90 capsule 3  . magnesium oxide (MAG-OX) 400 MG tablet Take 400 mg by mouth daily.     . mycophenolate (CELLCEPT) 250 MG capsule Take 3 capsules (750 mg total) by mouth 2 (two) times daily. 180 capsule 0  . niacin (NIASPAN) 500 MG CR tablet TAKE 2 TABLETS BY MOUTH AT BEDTIME 180 tablet 3  . NITROSTAT 0.4 MG SL tablet USE 1 TAB UNDER TONGUE EVERY 5 MINUTES UP TO 3 DOSES AS NEEDED (Patient taking differently: USE 1 TAB UNDER TONGUE EVERY 5 MINUTES UP TO 3 DOSES AS NEEDED FOR CHEST PAIN.) 25 tablet 2  . predniSONE (DELTASONE) 5 MG tablet TAKE 1 TABLET BY MOUTH DAILY 90 tablet 3  . ranitidine (ZANTAC) 150 MG tablet Take 1 tablet (150 mg total) by mouth at bedtime. 90 tablet 3  . sucralfate (CARAFATE) 1 GM/10ML suspension Take 10 mLs (1 g total) by mouth 4 (four) times daily -  before meals and at bedtime. As needed 2500 mL 3  . tacrolimus (PROGRAF) 0.5 MG capsule Take 0.5-1 mg by mouth See admin instructions. Take one capsule twice daily on Tuesday, Thursday, Saturday and two capsules in the morning and one capsule in the evening on Sunday,  Monday, Wednesday, Friday.    . valsartan (DIOVAN) 40 MG  tablet TAKE 1 TABLET BY MOUTH DAILY 90 tablet 3  . zolpidem (AMBIEN) 5 MG tablet Take 5 mg by mouth at bedtime as needed for sleep.     No facility-administered medications prior to visit.     No Known Allergies  ROS As per HPI  PE: Blood pressure 120/70, pulse 62, temperature 97.9 F (36.6 C), temperature source Oral, resp. rate 16, height 5\' 7"  (1.702 m), weight 218 lb (98.9 kg), SpO2 99 %. Gen: Alert, well appearing.  Patient is oriented to person, place, time, and situation. AFFECT: pleasant, lucid thought and speech. Neuro: CN 2-12 intact bilaterally, strength 5/5  in proximal and distal upper extremities and lower extremities bilaterally.  No sensory deficits.  No tremor.  No disdiadochokinesis.  No ataxia.  Upper extremity and lower extremity DTRs symmetric.  No pronator drift.  LABS:  Last Cr at [facility] yesterday was 1.3, GFR 59 ml/min.  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 Will increase this a little: 100 mg qd and 400 mg qhs. Will order MRI brain as per his [facility] nephrologist's suggestion given pt's immunosuppressed status + the fact that these HA's are atypical. Check BMET today.  If GFR>60 then will order MRI with and without contrast.    An After Visit Summary was printed and given to the patient.  FOLLOW UP: Return in about 4 weeks (around 05/16/2016) for f/u headaches.  Signed:  Crissie Sickles, MD           04/18/2016

## 2016-04-18 NOTE — Progress Notes (Signed)
Pre visit review using our clinic review tool, if applicable. No additional management support is needed unless otherwise documented below in the visit note. 

## 2016-04-18 NOTE — Progress Notes (Signed)
Patient ID: Roy Dyer, male   DOB: [birth year], 50 y.o.   MRN: [id number] Roy Dyer is a 50 y.o. male with a history of coronary artery disease who suffered anterior MI in March of 2012. He underwent bare metal stenting to his LAD at that time. Had previous stents to circ and RCA in 2001 At discharge, he was placed on a LifeVest because of depressed LV function. The patient was maintained on optimal medical therapy. His ejection fraction was reassessed 90 days post revascularization and was found to still be less than 35%. Had  AICD placed by Roy Dyer 11/20/10. DFT;s not tested due to history of large mural apical thrombus. Repeat MRI done 11/17/10 showed resolution of thrombus and EF 28% Coumadin stopped   Had hip replaced in 3/12 and had worse renal failure and ended up with renal transplant at [facility] them yearly and Roy Dyer every 4 months  Cr 1.6   Roy Dyer was donor.  Still with fistula in LUE On 2 drugs for rejection    AICD with no shocks and normal device interrogation  Has a MDT single chamber ICD implanted 2012 for ICM  Echo 09/06/14 EF 30-35% mild MR Severe LAE moderate TR  PA estimate 56 mmHg if RA assumed to be 15    Started on bystolic for relative tachycardia   04/02/14  Atypical chest pain Myovue reviewed and EF 35% large anterior infarct no ischemia   Roy Dyer had splenic coiling and infection doing better except neuropathy  He has had issues with headaches seeing doctor at [facility] getting MRI ? Tension    ROS: Denies fever, malais, weight loss, blurry vision, decreased visual acuity, cough, sputum, SOB, hemoptysis, pleuritic pain, palpitaitons, heartburn, abdominal pain, melena, lower extremity edema, claudication, or rash.  All other systems reviewed and negative  General: Affect appropriate Healthy:  appears stated age 15: normal Neck supple with no adenopathy JVP normal no bruits no thyromegaly Lungs clear with no wheezing and good diaphragmatic motion Heart:  S1/S2  SEM  murmur, no rub, gallop or click AICD under right clavcle  PMI normal Abdomen: benighn, BS positve, striae on sides transplanted kidney  no bruit.  No HSM or HJR Distal pulses intact fistula /thrill LUE  No edema Neuro non-focal Skin warm and dry No muscular weakness   Current Outpatient Prescriptions  Medication Sig Dispense Refill  . acetaminophen (TYLENOL) 500 MG tablet Take 1,000-1,500 mg by mouth every 6 (six) hours as needed for headache.    . allopurinol (ZYLOPRIM) 100 MG tablet Take 1 tablet (100 mg total) by mouth daily. 90 tablet 3  . aspirin EC 81 MG tablet Take 81 mg by mouth daily.    [name] atorvastatin (LIPITOR) 80 MG tablet TAKE 1 TABLET BY MOUTH DAILY 90 tablet 3  . BYSTOLIC 5 MG tablet TAKE 1 TABLET BY MOUTH DAILY 90 tablet 3  . Cholecalciferol (VITAMIN D-3) 5000 units TABS Take 1 tablet by mouth every other day.    . clopidogrel (PLAVIX) 75 MG tablet TAKE 1 TABLET BY MOUTH DAILY 90 tablet 3  . dexlansoprazole (DEXILANT) 60 MG capsule Take 1 capsule (60 mg total) by mouth daily before breakfast. 90 capsule 3  . gabapentin (NEURONTIN) 100 MG capsule Take 100-400 mg by mouth daily.    . magnesium oxide (MAG-OX) 400 MG tablet Take 400 mg by mouth daily.     . mycophenolate (CELLCEPT) 250 MG capsule Take 3 capsules (750 mg total) by mouth 2 (two) times  daily. 180 capsule 0  . niacin (NIASPAN) 500 MG CR tablet TAKE 2 TABLETS BY MOUTH AT BEDTIME 180 tablet 3  . NITROSTAT 0.4 MG SL tablet USE 1 TAB UNDER TONGUE EVERY 5 MINUTES UP TO 3 DOSES AS NEEDED 25 tablet 2  . predniSONE (DELTASONE) 5 MG tablet TAKE 1 TABLET BY MOUTH DAILY 90 tablet 3  . ranitidine (ZANTAC) 150 MG tablet Take 1 tablet (150 mg total) by mouth at bedtime. 90 tablet 3  . sucralfate (CARAFATE) 1 GM/10ML suspension Take 10 mLs (1 g total) by mouth 4 (four) times daily -  before meals and at bedtime. As needed 2500 mL 3  . tacrolimus (PROGRAF) 0.5 MG capsule Take 0.5-1 mg by mouth See admin instructions. Take  one capsule twice daily on Tuesday, Thursday, Saturday and two capsules in the morning and one capsule in the evening on Sunday,  Monday, Wednesday, Friday.    . valsartan (DIOVAN) 40 MG tablet TAKE 1 TABLET BY MOUTH DAILY 90 tablet 3  . zolpidem (AMBIEN) 5 MG tablet Take 5 mg by mouth at bedtime as needed for sleep.     No current facility-administered medications for this visit.     Allergies  Patient has no known allergies.  Electrocardiogram:  02/26/14   SR rate 92 LVH biatrial enlargement anterior MI  LAD   Assessment and Plan CAD/CABG Stable with no angina and good activity level.  Continue medical Rx Chol:  Cholesterol is at goal.  Continue current dose of statin and diet Rx.  No myalgias or side effects.  F/U  LFT's in 6 months. Lab Results  Component Value Date   LDLCALC 53 03/22/2015            Renal transplant Cr normal on celcept and prednisone f/u renal HTN Well controlled.  Continue current medications and low sodium Dash type diet.   AICD: last seen by [facility] 01/12/16 normal device function Pace Art report reviewed  DCM:  Reviewed echo from today EF stable at 30% euvolemic Headache:  F/u [facility] continue beta blocker MRI pending   F/U with me in a year    [employer]

## 2016-04-19 ENCOUNTER — Encounter: Payer: Self-pay | Admitting: *Deleted

## 2016-04-23 ENCOUNTER — Ambulatory Visit: Payer: Managed Care, Other (non HMO) | Admitting: Cardiovascular Disease

## 2016-04-23 ENCOUNTER — Ambulatory Visit (INDEPENDENT_AMBULATORY_CARE_PROVIDER_SITE_OTHER): Payer: Managed Care, Other (non HMO) | Admitting: Cardiovascular Disease

## 2016-04-23 ENCOUNTER — Telehealth: Payer: Self-pay | Admitting: Family Medicine

## 2016-04-23 ENCOUNTER — Encounter: Payer: Self-pay | Admitting: Cardiovascular Disease

## 2016-04-23 ENCOUNTER — Ambulatory Visit (HOSPITAL_COMMUNITY): Payer: Managed Care, Other (non HMO) | Attending: Cardiovascular Disease

## 2016-04-23 ENCOUNTER — Other Ambulatory Visit: Payer: Self-pay

## 2016-04-23 VITALS — BP 140/76 | HR 66 | Ht 67.0 in | Wt 216.0 lb

## 2016-04-23 DIAGNOSIS — I340 Nonrheumatic mitral (valve) insufficiency: Secondary | ICD-10-CM | POA: Diagnosis not present

## 2016-04-23 DIAGNOSIS — Z87891 Personal history of nicotine dependence: Secondary | ICD-10-CM | POA: Diagnosis not present

## 2016-04-23 DIAGNOSIS — I420 Dilated cardiomyopathy: Secondary | ICD-10-CM | POA: Insufficient documentation

## 2016-04-23 DIAGNOSIS — M329 Systemic lupus erythematosus, unspecified: Secondary | ICD-10-CM | POA: Insufficient documentation

## 2016-04-23 DIAGNOSIS — I5022 Chronic systolic (congestive) heart failure: Secondary | ICD-10-CM

## 2016-04-23 DIAGNOSIS — E785 Hyperlipidemia, unspecified: Secondary | ICD-10-CM | POA: Insufficient documentation

## 2016-04-23 DIAGNOSIS — N186 End stage renal disease: Secondary | ICD-10-CM | POA: Insufficient documentation

## 2016-04-23 DIAGNOSIS — I071 Rheumatic tricuspid insufficiency: Secondary | ICD-10-CM | POA: Diagnosis not present

## 2016-04-23 DIAGNOSIS — I120 Hypertensive chronic kidney disease with stage 5 chronic kidney disease or end stage renal disease: Secondary | ICD-10-CM | POA: Diagnosis not present

## 2016-04-23 DIAGNOSIS — Z940 Kidney transplant status: Secondary | ICD-10-CM | POA: Diagnosis not present

## 2016-04-23 DIAGNOSIS — G4489 Other headache syndrome: Secondary | ICD-10-CM

## 2016-04-23 DIAGNOSIS — I2510 Atherosclerotic heart disease of native coronary artery without angina pectoris: Secondary | ICD-10-CM | POA: Diagnosis not present

## 2016-04-23 DIAGNOSIS — I371 Nonrheumatic pulmonary valve insufficiency: Secondary | ICD-10-CM | POA: Diagnosis not present

## 2016-04-23 DIAGNOSIS — I255 Ischemic cardiomyopathy: Secondary | ICD-10-CM | POA: Insufficient documentation

## 2016-04-23 DIAGNOSIS — I252 Old myocardial infarction: Secondary | ICD-10-CM | POA: Insufficient documentation

## 2016-04-23 NOTE — Patient Instructions (Signed)

## 2016-04-23 NOTE — Telephone Encounter (Signed)
Pls notify pt that his insurance would not approve his MRI brain that I ordered.  I recommend a neurologist evaluation for his headaches.  Let me know what he says and I'll enter order as appropriate.-thx

## 2016-04-24 ENCOUNTER — Encounter: Payer: Self-pay | Admitting: Cardiology

## 2016-04-24 NOTE — Telephone Encounter (Signed)
Pt advised and voiced understanding.  He is okay with referral to neurology.

## 2016-04-24 NOTE — Telephone Encounter (Signed)
OK, referral to [facility] neurology has been ordered.

## 2016-05-09 ENCOUNTER — Encounter: Payer: Self-pay | Admitting: Cardiology

## 2016-05-14 DIAGNOSIS — G4733 Obstructive sleep apnea (adult) (pediatric): Secondary | ICD-10-CM

## 2016-05-14 HISTORY — DX: Obstructive sleep apnea (adult) (pediatric): G47.33

## 2016-05-15 LAB — CUP PACEART REMOTE DEVICE CHECK
Battery Voltage: 3 V
HIGH POWER IMPEDANCE MEASURED VALUE: 65 Ohm
HIGH POWER IMPEDANCE MEASURED VALUE: 79 Ohm
HighPow Impedance: 342 Ohm
Implantable Pulse Generator Implant Date: 20120709
Lead Channel Impedance Value: 418 Ohm
Lead Channel Sensing Intrinsic Amplitude: 15.75 mV
Lead Channel Sensing Intrinsic Amplitude: 15.75 mV
Lead Channel Setting Pacing Amplitude: 2.75 V
Lead Channel Setting Pacing Pulse Width: 0.4 ms
MDC IDC LEAD IMPLANT DT: 20120709
MDC IDC LEAD LOCATION: 753860
MDC IDC MSMT LEADCHNL RV PACING THRESHOLD AMPLITUDE: 1.375 V
MDC IDC MSMT LEADCHNL RV PACING THRESHOLD PULSEWIDTH: 0.4 ms
MDC IDC SESS DTM: 20171130012557
MDC IDC SET LEADCHNL RV SENSING SENSITIVITY: 0.3 mV
MDC IDC STAT BRADY RV PERCENT PACED: 0.06 %

## 2016-05-18 ENCOUNTER — Ambulatory Visit: Payer: Managed Care, Other (non HMO) | Admitting: Family Medicine

## 2016-05-21 ENCOUNTER — Ambulatory Visit (INDEPENDENT_AMBULATORY_CARE_PROVIDER_SITE_OTHER): Payer: Managed Care, Other (non HMO) | Admitting: Family Medicine

## 2016-05-21 ENCOUNTER — Encounter: Payer: Self-pay | Admitting: Family Medicine

## 2016-05-21 VITALS — BP 111/70 | HR 63 | Temp 98.2°F | Resp 16 | Wt 216.0 lb

## 2016-05-21 DIAGNOSIS — G4489 Other headache syndrome: Secondary | ICD-10-CM | POA: Diagnosis not present

## 2016-05-21 MED ORDER — GABAPENTIN 300 MG PO CAPS: ORAL_CAPSULE | ORAL | 3 refills | Status: DC

## 2016-05-21 MED ORDER — RIZATRIPTAN BENZOATE 10 MG PO TABS: 10.0000 mg | ORAL_TABLET | ORAL | 0 refills | Status: DC | PRN

## 2016-05-21 NOTE — Progress Notes (Signed)
OFFICE VISIT  05/21/2016   CC:  Chief Complaint  Patient presents with  . Follow-up    headaches   HPI:    Patient is a 51 y.o. Caucasian male who presents for 1 mo f/u headaches. No change in HA frequency, character, or intensity. 90% R sided, starts  Around R eye and max sinus area and radiates back.  No vision c/o, no nausea or photophobia.   He has no side effects from neurontin and has not had any help from this med for his HAs. Has neurology consult set for 07/06/16.  His  Insurer denied by attempt to check a brain MRI.    Past Medical History:  Diagnosis Date  . AICD (automatic cardioverter/defibrillator) present    Dr. Paschal Dopp follows remotely-yrly checks, Dr. Jonne Ply  . Avascular necrosis of bone of hip ([place]) 2010 surg   Left hip arthroplasty: from chronic systemic steroids taken for his Lupus  . Barrett's esophagus   . CAD (coronary artery disease)     stents RCA/Circ 2001, BMS to LAD 07/2010  . Cardiomyopathy    (Ischemic):  Chronic systolic dysfunction--  95/2841 EF 30-35%.   Single chamber ICD 11/20/10 ([name])  . Chronic renal insufficiency, stage II (mild)    GFR 65 ml/min 07/19/15 at local renal f/u (Cr 1.29)  . End stage renal disease ([place]) 04/24/2011   Secondary to SLE: HD 06/2011-10/2012 (then got renal transplant)  . GERD (gastroesophageal reflux disease)    Hx of esoph stricture and dilatation.  +Barrett's esophagus+  . Gout    s/p renal transplant he was weened off of his allopurinol.  . Hiatal hernia   . History of renal transplant 11/10/2012   10/2012 [facility]   . Hypertension   . implantable cardiac defibrillator single chamber    Medtronic (due to low EF)  . Left ventricular thrombus 2012   Re-eval 02/2011 showed thrombus RESOLVED, so coumadin was d/c'd (was on it for 31mo)  . Lupus   . Myocardial infarction 2001 and 2012   3 stents and defib placed in July  . Post-transplant erythrocytosis    improved with ARB (valsartan)    Past Surgical  History:  Procedure Laterality Date  . AV FISTULA PLACEMENT  03/28/2011   Procedure: ARTERIOVENOUS (AV) FISTULA CREATION;  Surgeon: Rosetta Posner, MD;  Location: [place];  Service: Vascular;  Laterality: Left;  Creation of Left radiocephallic cimino fistula  . AV FISTULA PLACEMENT  04/27/2011   Procedure: ARTERIOVENOUS (AV) FISTULA CREATION;  Surgeon: Rosetta Posner, MD;  Location: [place];  Service: Vascular;  Laterality: Left;  left basilic vein transposition  . CARDIAC CATHETERIZATION     08/2010  . CARDIOVASCULAR STRESS TEST  07/2010; 03/2014   03/2014 showed large old infarct and EF 30-35% but no ischemia  . ESOPHAGOGASTRODUODENOSCOPY  02/2009   Done by Dr. Fuller Plan for hematemesis; esophagitis found.  Repeat recommended 09/2016, at which time he will also likely get his initial screening colonoscopy.  . ESOPHAGOGASTRODUODENOSCOPY (EGD) WITH PROPOFOL N/A 09/29/2015   REPEAT EGD RECOMMENDED 09/2016.  Barrett's esoph + mild chronic gastritis.  H pylori NEG. Procedure: ESOPHAGOGASTRODUODENOSCOPY (EGD) WITH PROPOFOL;  Surgeon: Jerene Bears, MD;  Location: [facility];  Service: Gastroenterology;  Laterality: N/A;  . INSERT / REPLACE / REMOVE PACEMAKER     medtronic        dr [name]    [place]   icd only  . KIDNEY TRANSPLANT  10/2012   [facility] nephrologist--Dr. Chaney Malling  [place]  . PARTIAL HIP ARTHROPLASTY     left  . RENAL BIOPSY    . stents     05-2010 and 2- in 2010  . TOTAL HIP ARTHROPLASTY  07/09/2011   Procedure: TOTAL HIP ARTHROPLASTY;  Surgeon: Kerin Salen, MD;  Location: [place];  Service: Orthopedics;  Laterality: Right;  . TYMPANOSTOMY TUBE PLACEMENT  51 yrs old  . US ECHOCARDIOGRAPHY  12/2010; 02/2014;08/2014   02/2014 EF still 25-30%, increased PA pressures.  2016 EF 30-35%, sept/inf hypokinesis, mod tricusp regurg    Outpatient Medications Prior to Visit  Medication Sig Dispense Refill  . acetaminophen (TYLENOL) 500 MG tablet Take 1,000-1,500 mg by mouth every 6 (six) hours as needed for  headache.    . allopurinol (ZYLOPRIM) 100 MG tablet Take 1 tablet (100 mg total) by mouth daily. 90 tablet 3  . aspirin EC 81 MG tablet Take 81 mg by mouth daily.    [name] atorvastatin (LIPITOR) 80 MG tablet TAKE 1 TABLET BY MOUTH DAILY 90 tablet 3  . BYSTOLIC 5 MG tablet TAKE 1 TABLET BY MOUTH DAILY 90 tablet 3  . Cholecalciferol (VITAMIN D-3) 5000 units TABS Take 1 tablet by mouth every other day.    . clopidogrel (PLAVIX) 75 MG tablet TAKE 1 TABLET BY MOUTH DAILY 90 tablet 3  . dexlansoprazole (DEXILANT) 60 MG capsule Take 1 capsule (60 mg total) by mouth daily before breakfast. 90 capsule 3  . magnesium oxide (MAG-OX) 400 MG tablet Take 400 mg by mouth daily.     . mycophenolate (CELLCEPT) 250 MG capsule Take 3 capsules (750 mg total) by mouth 2 (two) times daily. 180 capsule 0  . niacin (NIASPAN) 500 MG CR tablet TAKE 2 TABLETS BY MOUTH AT BEDTIME 180 tablet 3  . NITROSTAT 0.4 MG SL tablet USE 1 TAB UNDER TONGUE EVERY 5 MINUTES UP TO 3 DOSES AS NEEDED 25 tablet 2  . predniSONE (DELTASONE) 5 MG tablet TAKE 1 TABLET BY MOUTH DAILY 90 tablet 3  . ranitidine (ZANTAC) 150 MG tablet Take 1 tablet (150 mg total) by mouth at bedtime. 90 tablet 3  . tacrolimus (PROGRAF) 0.5 MG capsule Take 0.5-1 mg by mouth See admin instructions. Take one capsule twice daily on Tuesday, Thursday, Saturday and two capsules in the morning and one capsule in the evening on Sunday,  Monday, Wednesday, Friday.    . valsartan (DIOVAN) 40 MG tablet TAKE 1 TABLET BY MOUTH DAILY 90 tablet 3  . gabapentin (NEURONTIN) 100 MG capsule Take 100-400 mg by mouth daily.    . sucralfate (CARAFATE) 1 GM/10ML suspension Take 10 mLs (1 g total) by mouth 4 (four) times daily -  before meals and at bedtime. As needed (Patient not taking: Reported on 05/21/2016) 2500 mL 3  . zolpidem (AMBIEN) 5 MG tablet Take 5 mg by mouth at bedtime as needed for sleep.     No facility-administered medications prior to visit.     No Known  Allergies  ROS As per HPI  PE: Blood pressure 111/70, pulse 63, temperature 98.2 F (36.8 C), temperature source Oral, resp. rate 16, weight 216 lb (98 kg), SpO2 98 %. Gen: Alert, well appearing.  Patient is oriented to person, place, time, and situation. AFFECT: pleasant, lucid thought and speech. No further exam today.  LABS:  none  IMPRESSION AND PLAN:  Atypical HA syndrome: Would like neuroimaging but this was declined by insurer. Has neuro consult arranged for 07/06/16. In the meantime we'll push  gabapentin dose to 300 qAM and 600 qhs and start trial of maxalt 10mg  prn HA. Therapeutic expectations and side effect profile of medication discussed today.  Patient's questions answered.  An After Visit Summary was printed and given to the patient.  FOLLOW UP: Return in about 4 weeks (around 06/18/2016) for f/u headaches.  Signed:  Crissie Sickles, MD           05/21/2016

## 2016-05-21 NOTE — Progress Notes (Signed)
Pre visit review using our clinic review tool, if applicable. No additional management support is needed unless otherwise documented below in the visit note. 

## 2016-06-29 ENCOUNTER — Ambulatory Visit (INDEPENDENT_AMBULATORY_CARE_PROVIDER_SITE_OTHER): Payer: Managed Care, Other (non HMO) | Admitting: Family Medicine

## 2016-06-29 ENCOUNTER — Encounter: Payer: Self-pay | Admitting: Family Medicine

## 2016-06-29 VITALS — BP 117/80 | HR 70 | Temp 97.6°F | Resp 16 | Ht 67.0 in | Wt 220.8 lb

## 2016-06-29 DIAGNOSIS — R519 Headache, unspecified: Secondary | ICD-10-CM

## 2016-06-29 DIAGNOSIS — R51 Headache: Secondary | ICD-10-CM

## 2016-06-29 DIAGNOSIS — G8929 Other chronic pain: Secondary | ICD-10-CM

## 2016-06-29 NOTE — Progress Notes (Signed)
Pre visit review using our clinic review tool, if applicable. No additional management support is needed unless otherwise documented below in the visit note. 

## 2016-06-29 NOTE — Progress Notes (Signed)
OFFICE VISIT  06/29/2016   CC:  Chief Complaint  Patient presents with  . Follow-up    Headaches   HPI:    Patient is a 51 y.o. Caucasian male who presents for 5 week f/u atypical HA syndrome. Last visit I increased his neurontin to 300 qAM and 600 qhs and started trial of maxalt 10mg  as abortive medication. He has a neurology consult arranged for 07/06/16 for further evaluation of these HA's.  His insurer has denied my attempt to check a brain MRI. Says HA's possibly improved/less frequent since last visit. Maxalt helped initial time he took it, then 2nd time caused very significant palpitations.    No further palp's since d/'c of maxalt.   Past Medical History:  Diagnosis Date  . AICD (automatic cardioverter/defibrillator) present    Dr. Paschal Dopp follows remotely-yrly checks, Dr. Jonne Ply  . Avascular necrosis of bone of hip ([place]) 2010 surg   Left hip arthroplasty: from chronic systemic steroids taken for his Lupus  . Barrett's esophagus   . CAD (coronary artery disease)     stents RCA/Circ 2001, BMS to LAD 07/2010  . Cardiomyopathy    (Ischemic):  Chronic systolic dysfunction--  09/3974 EF 30-35%.   Single chamber ICD 11/20/10 ([name])  . Chronic renal insufficiency, stage II (mild)    GFR 65 ml/min 07/19/15 at local renal f/u (Cr 1.29)  . End stage renal disease ([place]) 04/24/2011   Secondary to SLE: HD 06/2011-10/2012 (then got renal transplant)  . GERD (gastroesophageal reflux disease)    Hx of esoph stricture and dilatation.  +Barrett's esophagus+  . Gout    s/p renal transplant he was weened off of his allopurinol.  . Hiatal hernia   . History of renal transplant 11/10/2012   10/2012 [facility]   . Hypertension   . implantable cardiac defibrillator single chamber    Medtronic (due to low EF)  . Left ventricular thrombus 2012   Re-eval 02/2011 showed thrombus RESOLVED, so coumadin was d/c'd (was on it for 43mo)  . Lupus   . Myocardial infarction 2001 and 2012   3 stents  and defib placed in July  . Post-transplant erythrocytosis    improved with ARB (valsartan)    Past Surgical History:  Procedure Laterality Date  . AV FISTULA PLACEMENT  03/28/2011   Procedure: ARTERIOVENOUS (AV) FISTULA CREATION;  Surgeon: Rosetta Posner, MD;  Location: [place];  Service: Vascular;  Laterality: Left;  Creation of Left radiocephallic cimino fistula  . AV FISTULA PLACEMENT  04/27/2011   Procedure: ARTERIOVENOUS (AV) FISTULA CREATION;  Surgeon: Rosetta Posner, MD;  Location: [place];  Service: Vascular;  Laterality: Left;  left basilic vein transposition  . CARDIAC CATHETERIZATION     08/2010  . CARDIOVASCULAR STRESS TEST  07/2010; 03/2014   03/2014 showed large old infarct and EF 30-35% but no ischemia  . ESOPHAGOGASTRODUODENOSCOPY  02/2009   Done by Dr. Fuller Plan for hematemesis; esophagitis found.  Repeat recommended 09/2016, at which time he will also likely get his initial screening colonoscopy.  . ESOPHAGOGASTRODUODENOSCOPY (EGD) WITH PROPOFOL N/A 09/29/2015   REPEAT EGD RECOMMENDED 09/2016.  Barrett's esoph + mild chronic gastritis.  H pylori NEG. Procedure: ESOPHAGOGASTRODUODENOSCOPY (EGD) WITH PROPOFOL;  Surgeon: Jerene Bears, MD;  [facility];  Service: Gastroenterology;  Laterality: N/A;  . INSERT / REPLACE / REMOVE PACEMAKER     medtronic        dr [name]    [place]   icd only  .  KIDNEY TRANSPLANT  10/2012   [facility] nephrologist--Dr. Blair Heys  . PARTIAL HIP ARTHROPLASTY     left  . RENAL BIOPSY    . stents     05-2010 and 2- in 2010  . TOTAL HIP ARTHROPLASTY  07/09/2011   Procedure: TOTAL HIP ARTHROPLASTY;  Surgeon: Kerin Salen, MD;  Location: [place];  Service: Orthopedics;  Laterality: Right;  . TYMPANOSTOMY TUBE PLACEMENT  51 yrs old  . US ECHOCARDIOGRAPHY  12/2010; 02/2014;08/2014   02/2014 EF still 25-30%, increased PA pressures.  2016 EF 30-35%, sept/inf hypokinesis, mod tricusp regurg    Outpatient Medications Prior to Visit  Medication Sig Dispense  Refill  . acetaminophen (TYLENOL) 500 MG tablet Take 1,000-1,500 mg by mouth every 6 (six) hours as needed for headache.    . allopurinol (ZYLOPRIM) 100 MG tablet Take 1 tablet (100 mg total) by mouth daily. 90 tablet 3  . aspirin EC 81 MG tablet Take 81 mg by mouth daily.    [name] atorvastatin (LIPITOR) 80 MG tablet TAKE 1 TABLET BY MOUTH DAILY 90 tablet 3  . BYSTOLIC 5 MG tablet TAKE 1 TABLET BY MOUTH DAILY 90 tablet 3  . Cholecalciferol (VITAMIN D-3) 5000 units TABS Take 1 tablet by mouth every other day.    . clopidogrel (PLAVIX) 75 MG tablet TAKE 1 TABLET BY MOUTH DAILY 90 tablet 3  . dexlansoprazole (DEXILANT) 60 MG capsule Take 1 capsule (60 mg total) by mouth daily before breakfast. 90 capsule 3  . gabapentin (NEURONTIN) 300 MG capsule 1 tab po qAM and 2 tabs po qhs 90 capsule 3  . magnesium oxide (MAG-OX) 400 MG tablet Take 400 mg by mouth daily.     . mycophenolate (CELLCEPT) 250 MG capsule Take 3 capsules (750 mg total) by mouth 2 (two) times daily. 180 capsule 0  . niacin (NIASPAN) 500 MG CR tablet TAKE 2 TABLETS BY MOUTH AT BEDTIME 180 tablet 3  . NITROSTAT 0.4 MG SL tablet USE 1 TAB UNDER TONGUE EVERY 5 MINUTES UP TO 3 DOSES AS NEEDED 25 tablet 2  . predniSONE (DELTASONE) 5 MG tablet TAKE 1 TABLET BY MOUTH DAILY 90 tablet 3  . ranitidine (ZANTAC) 150 MG tablet Take 1 tablet (150 mg total) by mouth at bedtime. 90 tablet 3  . sucralfate (CARAFATE) 1 GM/10ML suspension Take 10 mLs (1 g total) by mouth 4 (four) times daily -  before meals and at bedtime. As needed 2500 mL 3  . tacrolimus (PROGRAF) 0.5 MG capsule Take 0.5-1 mg by mouth See admin instructions. Take one capsule twice daily on Tuesday, Thursday, Saturday and two capsules in the morning and one capsule in the evening on Sunday,  Monday, Wednesday, Friday.    . valsartan (DIOVAN) 40 MG tablet TAKE 1 TABLET BY MOUTH DAILY 90 tablet 3  . zolpidem (AMBIEN) 5 MG tablet Take 5 mg by mouth at bedtime as needed for sleep.    .  rizatriptan (MAXALT) 10 MG tablet Take 1 tablet (10 mg total) by mouth as needed for migraine. May repeat in 2 hours if needed (Patient not taking: Reported on 06/29/2016) 9 tablet 0   No facility-administered medications prior to visit.     No Known Allergies  ROS As per HPI  PE: Blood pressure 117/80, pulse 70, temperature 97.6 F (36.4 C), temperature source Oral, resp. rate 16, height 5\' 7"  (1.702 m), weight 220 lb 12 oz (100.1 kg), SpO2 95 %. Gen: Alert, well appearing.  Patient  is oriented to person, place, time, and situation. AFFECT: pleasant, lucid thought and speech. No further exam today.  LABS:  None now  IMPRESSION AND PLAN:  Atypical HA syndrome:  No significant response to neurontin 300 mg qAM and 600 mg qhs. Adverse response to maxalt trial--will put triptans on pt's allergy/intolerance list. No other changes at this time.  Pt to keep consult with neurologist in 1 week.  An After Visit Summary was printed and given to the patient.  FOLLOW UP: Return in about 4 months (around 10/27/2016) for routine chronic illness f/u.  Signed:  Crissie Sickles, MD           06/29/2016

## 2016-07-06 ENCOUNTER — Ambulatory Visit (INDEPENDENT_AMBULATORY_CARE_PROVIDER_SITE_OTHER): Payer: Managed Care, Other (non HMO) | Admitting: Neurology

## 2016-07-06 ENCOUNTER — Telehealth: Payer: Self-pay

## 2016-07-06 ENCOUNTER — Encounter: Payer: Self-pay | Admitting: Neurology

## 2016-07-06 VITALS — BP 118/72 | HR 78 | Ht 67.0 in | Wt 219.1 lb

## 2016-07-06 DIAGNOSIS — R519 Headache, unspecified: Secondary | ICD-10-CM

## 2016-07-06 DIAGNOSIS — R51 Headache: Principal | ICD-10-CM

## 2016-07-06 DIAGNOSIS — G478 Other sleep disorders: Secondary | ICD-10-CM | POA: Diagnosis not present

## 2016-07-06 MED ORDER — GABAPENTIN 300 MG PO CAPS: 600.0000 mg | ORAL_CAPSULE | Freq: Two times a day (BID) | ORAL | 3 refills | Status: DC

## 2016-07-06 NOTE — Telephone Encounter (Signed)
Spoke to patient. Gave instructions for CTA Head imaging per previous note.

## 2016-07-06 NOTE — Progress Notes (Signed)
NEUROLOGY CONSULTATION NOTE  GAMAL TODISCO MRN: [id number] DOB: 1966-01-02  Referring provider: Dr. Anitra Lauth Primary care provider: Dr. Anitra Lauth  Reason for consult:  headache  HISTORY OF PRESENT ILLNESS: Roy Dyer is a 51 year old right-handed male with hypertension, ischemic cardiomyopathy, CAD, status post defibrillator, bilateral hip replacement, mixed hyperlipidemia, lupus with ESRD and renal transplant and history of viral meningitis who presents for headache.  History supplemented by PCP notes.  Onset:  Started having headaches two years ago, which gradually increased in frequency over the past 6 months.  Initially, he developed them at the end of the day while at work but recently he would wake up with them too. Location:  right sided (retro-orbital and radiates to right ear) Quality:  Nonthrobbing, ache Intensity:  Usually 3/10 Aura:  no Prodrome:  no Associated symptoms:  No nausea, vomiting, photophobia, phonophobia, visual disturbance.  Not a thunderclap headache or wakes up from sleep.  Denies fevers, confusion. Duration:  2 hours Frequency:  Initially 4 to 5 days a week but over past 1 to 2 months, about 5 to 10 days a month Frequency of abortive medication: 5 to 10 days a month. Triggers/exacerbating factors:  Stress.   Relieving factors:  Rubbing his neck but denies neck pain. Activity:  Usually able to function  Past NSAIDS:  no Past analgesics:  no Past abortive triptans:  Maxalt (palpitations) Past muscle relaxants:  no Past anti-emetic:  no Past antihypertensive medications:  furosemide, losartan, Imdur, metoprolol Past antidepressant medications:  no Past anticonvulsant medications:  no Past vitamins/Herbal/Supplements:  no Other past therapies:  no  Current NSAIDS:  no Current analgesics:  Tylenol Current triptans:  no Current anti-emetic:  no Current muscle relaxants:  no Current anti-anxiolytic:  no Current sleep aide:  Ambien Current  Antihypertensive medications:  Diovan, nebivolol Current Antidepressant medications:  no Current Anticonvulsant medications:  gabapentin 300mg  in AM and 600mg  QHS Current Vitamins/Herbal/Supplements:  magnesium oxide 400mg  Current Antihistamines/Decongestants:  no Other therapy:  no Other medication:  ASA 81mg , Plavix, Prograf  Caffeine:  Coffee daily Alcohol:  no Smoker:  no Diet:  Drinks two 16 oz bottle of water Exercise:  no Depression/anxiety:  Works as a Health visitor.  Work intensity is cyclic and stress reduced recently Sleep hygiene:  Poor.  Wakes up often Family history of headache:  no  04/18/16 BMP: Na 142, K 4.5, Cl 104, CO2 33, glucose 111, BUN 17, Cr 1.33.  PAST MEDICAL HISTORY: Past Medical History:  Diagnosis Date  . AICD (automatic cardioverter/defibrillator) present    Dr. Paschal Dopp follows remotely-yrly checks, Dr. Jonne Ply  . Avascular necrosis of bone of hip ([place]) 2010 surg   Left hip arthroplasty: from chronic systemic steroids taken for his Lupus  . Barrett's esophagus   . CAD (coronary artery disease)     stents RCA/Circ 2001, BMS to LAD 07/2010  . Cardiomyopathy    (Ischemic):  Chronic systolic dysfunction--  76/1950 EF 30-35%.   Single chamber ICD 11/20/10 ([name])  . Chronic renal insufficiency, stage II (mild)    GFR 65 ml/min 07/19/15 at local renal f/u (Cr 1.29)  . End stage renal disease ([place]) 04/24/2011   Secondary to SLE: HD 06/2011-10/2012 (then got renal transplant)  . GERD (gastroesophageal reflux disease)    Hx of esoph stricture and dilatation.  +Barrett's esophagus+  . Gout    s/p renal transplant he was weened off of his allopurinol.  . Hiatal hernia   . History of renal transplant  11/10/2012   10/2012 [facility]   . Hypertension   . implantable cardiac defibrillator single chamber    Medtronic (due to low EF)  . Left ventricular thrombus 2012   Re-eval 02/2011 showed thrombus RESOLVED, so coumadin was d/c'd (was on it for 4mo)  . Lupus   .  Myocardial infarction 2001 and 2012   3 stents and defib placed in July  . Post-transplant erythrocytosis    improved with ARB (valsartan)    PAST SURGICAL HISTORY: Past Surgical History:  Procedure Laterality Date  . AV FISTULA PLACEMENT  03/28/2011   Procedure: ARTERIOVENOUS (AV) FISTULA CREATION;  Surgeon: Rosetta Posner, MD;  Location: [place];  Service: Vascular;  Laterality: Left;  Creation of Left radiocephallic cimino fistula  . AV FISTULA PLACEMENT  04/27/2011   Procedure: ARTERIOVENOUS (AV) FISTULA CREATION;  Surgeon: Rosetta Posner, MD;  Location: [place];  Service: Vascular;  Laterality: Left;  left basilic vein transposition  . CARDIAC CATHETERIZATION     08/2010  . CARDIOVASCULAR STRESS TEST  07/2010; 03/2014   03/2014 showed large old infarct and EF 30-35% but no ischemia  . ESOPHAGOGASTRODUODENOSCOPY  02/2009   Done by Dr. Fuller Plan for hematemesis; esophagitis found.  Repeat recommended 09/2016, at which time he will also likely get his initial screening colonoscopy.  . ESOPHAGOGASTRODUODENOSCOPY (EGD) WITH PROPOFOL N/A 09/29/2015   REPEAT EGD RECOMMENDED 09/2016.  Barrett's esoph + mild chronic gastritis.  H pylori NEG. Procedure: ESOPHAGOGASTRODUODENOSCOPY (EGD) WITH PROPOFOL;  Surgeon: Jerene Bears, MD;  Location: [facility];  Service: Gastroenterology;  Laterality: N/A;  . INSERT / REPLACE / REMOVE PACEMAKER     medtronic        dr [name]    [place]   icd only  . KIDNEY TRANSPLANT  10/2012   [facility] nephrologist--Dr. Blair Heys  . PARTIAL HIP ARTHROPLASTY     left  . RENAL BIOPSY    . stents     05-2010 and 2- in 2010  . TOTAL HIP ARTHROPLASTY  07/09/2011   Procedure: TOTAL HIP ARTHROPLASTY;  Surgeon: Kerin Salen, MD;  Location: [place];  Service: Orthopedics;  Laterality: Right;  . TYMPANOSTOMY TUBE PLACEMENT  51 yrs old  . US ECHOCARDIOGRAPHY  12/2010; 02/2014;08/2014   02/2014 EF still 25-30%, increased PA pressures.  2016 EF 30-35%, sept/inf hypokinesis, mod tricusp regurg      MEDICATIONS: Current Outpatient Prescriptions on File Prior to Visit  Medication Sig Dispense Refill  . acetaminophen (TYLENOL) 500 MG tablet Take 1,000-1,500 mg by mouth every 6 (six) hours as needed for headache.    . allopurinol (ZYLOPRIM) 100 MG tablet Take 1 tablet (100 mg total) by mouth daily. 90 tablet 3  . aspirin EC 81 MG tablet Take 81 mg by mouth daily.    [name] atorvastatin (LIPITOR) 80 MG tablet TAKE 1 TABLET BY MOUTH DAILY 90 tablet 3  . BYSTOLIC 5 MG tablet TAKE 1 TABLET BY MOUTH DAILY 90 tablet 3  . Cholecalciferol (VITAMIN D-3) 5000 units TABS Take 1 tablet by mouth every other day.    . clopidogrel (PLAVIX) 75 MG tablet TAKE 1 TABLET BY MOUTH DAILY 90 tablet 3  . dexlansoprazole (DEXILANT) 60 MG capsule Take 1 capsule (60 mg total) by mouth daily before breakfast. 90 capsule 3  . magnesium oxide (MAG-OX) 400 MG tablet Take 400 mg by mouth daily.     . mycophenolate (CELLCEPT) 250 MG capsule Take 3 capsules (750 mg total) by mouth 2 (two)  times daily. 180 capsule 0  . niacin (NIASPAN) 500 MG CR tablet TAKE 2 TABLETS BY MOUTH AT BEDTIME 180 tablet 3  . NITROSTAT 0.4 MG SL tablet USE 1 TAB UNDER TONGUE EVERY 5 MINUTES UP TO 3 DOSES AS NEEDED 25 tablet 2  . predniSONE (DELTASONE) 5 MG tablet TAKE 1 TABLET BY MOUTH DAILY 90 tablet 3  . ranitidine (ZANTAC) 150 MG tablet Take 1 tablet (150 mg total) by mouth at bedtime. 90 tablet 3  . sucralfate (CARAFATE) 1 GM/10ML suspension Take 10 mLs (1 g total) by mouth 4 (four) times daily -  before meals and at bedtime. As needed 2500 mL 3  . tacrolimus (PROGRAF) 0.5 MG capsule Take 0.5-1 mg by mouth See admin instructions. Take one capsule twice daily on Tuesday, Thursday, Saturday and two capsules in the morning and one capsule in the evening on Sunday,  Monday, Wednesday, Friday.    . valsartan (DIOVAN) 40 MG tablet TAKE 1 TABLET BY MOUTH DAILY 90 tablet 3  . zolpidem (AMBIEN) 5 MG tablet Take 5 mg by mouth at bedtime as needed for sleep.     . [DISCONTINUED] calcium carbonate, dosed in mg elemental calcium, 1250 MG/5ML Take 5 mLs (500 mg of elemental calcium total) by mouth every 6 (six) hours as needed. 450 mL 2   No current facility-administered medications on file prior to visit.     ALLERGIES: Allergies  Allergen Reactions  . Triptans Palpitations    Maxalt    FAMILY HISTORY: Family History  Problem Relation Age of Onset  . Kidney failure Mother   . Lupus Mother   . Stroke Mother   . Diabetes Mother     type 2  . Heart attack Father     X 7  . Hypertension Father   . Hyperlipidemia Father   . Heart attack Sister 22    X 1  . Hyperlipidemia Sister   . Hypertension Sister   . Heart disease Sister   . Heart attack Paternal Grandfather   . Heart disease Paternal Aunt   . Heart disease Paternal Uncle     SOCIAL HISTORY: Social History   Social History  . Marital status: Married    Spouse name: N/A  . Number of children: N/A  . Years of education: N/A   Occupational History  . CAD Drafter  [employer]   Social History Main Topics  . Smoking status: Former Smoker    Packs/day: 0.50    Years: 30.00    Types: Cigarettes    Quit date: 08/02/2010  . Smokeless tobacco: Never Used  . Alcohol use No  . Drug use: No  . Sexual activity: Yes    Partners: Female   Other Topics Concern  . Not on file   Social History Narrative   Married, 1 teenage son and 1 teenage daughter.    Occupation: Printmaker.   15 pack-yr smoking hx, quit 07/2010.   Drug Use - no   No alcohol.             REVIEW OF SYSTEMS: Constitutional: No fevers, chills, or sweats, no generalized fatigue, change in appetite Eyes: No visual changes, double vision, eye pain Ear, nose and throat: No hearing loss, ear pain, nasal congestion, sore throat Cardiovascular: No chest pain, palpitations Respiratory:  No shortness of breath at rest or with exertion, wheezes GastrointestinaI: No nausea, vomiting, diarrhea, abdominal  pain, fecal incontinence Genitourinary:  No dysuria, urinary retention or frequency Musculoskeletal:  No neck pain, back pain Integumentary: No rash, pruritus, skin lesions Neurological: as above Psychiatric: No depression, insomnia, anxiety Endocrine: No palpitations, fatigue, diaphoresis, mood swings, change in appetite, change in weight, increased thirst Hematologic/Lymphatic:  No purpura, petechiae. Allergic/Immunologic: no itchy/runny eyes, nasal congestion, recent allergic reactions, rashes  PHYSICAL EXAM: Vitals:   07/06/16 0758  BP: 118/72  Pulse: 78   General: No acute distress.  Patient appears well-groomed.  Head:  Normocephalic/atraumatic Eyes:  fundi examined but not visualized Neck: supple, no paraspinal tenderness, full range of motion Back: No paraspinal tenderness Heart: regular rate and rhythm Lungs: Clear to auscultation bilaterally. Vascular: No carotid bruits. Neurological Exam: Mental status: alert and oriented to person, place, and time, recent and remote memory intact, fund of knowledge intact, attention and concentration intact, speech fluent and not dysarthric, language intact. Cranial nerves: CN I: not tested CN II: pupils equal, round and reactive to light, visual fields intact CN III, IV, VI:  full range of motion, no nystagmus, no ptosis CN V: facial sensation intact CN VII: upper and lower face symmetric CN VIII: hearing intact CN IX, X: gag intact, uvula midline CN XI: sternocleidomastoid and trapezius muscles intact CN XII: tongue midline Bulk & Tone: normal, no fasciculations. Motor:  5/5 throughout  Sensation: temperature and vibration sensation intact. Deep Tendon Reflexes:  2+ throughout, toes downgoing.  Finger to nose testing:  Without dysmetria.  Heel to shin:  Without dysmetria.  Gait:  Mildly wide-based.  Able to turn.. Romberg negative.  IMPRESSION: 1.  Worsening right sided unilateral headache, likely tension-type but must rule  out secondary cause.  I don't suspect meningitis. 2.  Poor sleep.  Concern for OSA, which contributes to headaches.  Also, he  has underlying cardiomyopathy and CAD  PLAN: 1.  CT and CTA of head to look for intracranial abnormality.  Ideally, I would like to get an MRI, but his defibrillator is not MRI-compatible.  We have contacted her surgeon who performed the renal transplant to find out if we can get CTA with contrast.  Still waiting to hear back from them.  Otherwise, just a CT of the head alone would suffice. 2.  Increase gabapentin to 600mg  twice daily 3.  Limit Tylenol to no more than 2 days out of the week 4.  Refer for evaluation of OSA 5.  Lifestyle modification:  Increase water intake, exercise, diet 6.  Follow up in 3 months but contact me in 4 weeks with update.  Thank you for allowing me to take part in the care of this patient.  Metta Clines, DO  CC:  Shawnie Dapper, MD

## 2016-07-06 NOTE — Patient Instructions (Signed)
The headaches are likely tension headache. 1.  We will increase gabapentin to 600mg  twice daily.  Call in 4 weeks with update and we can adjust dose if needed. 2.  Limit use of Tylenol to no more than 2 days out of the week to prevent rebound headache 3. I will send you for evaluation of sleep apnea, which contributes to headaches. 4.  Be aware of common food triggers such as processed sweets, processed foods with nitrites (such as deli meat, hot dogs, sausages), foods with MSG, alcohol (such as wine), chocolate, certain cheeses, certain fruits (dried fruits, some citrus fruit), vinegar, diet soda. 4.  Avoid caffeine 5.  Routine exercise 6.  Proper sleep hygiene 7.  Stay adequately hydrated with water 8.  Keep a headache diary. 9.  Maintain proper stress management. 10.  Do not skip meals. 11.  We will check CT and CTA of head. 12.  Follow up in 3 months but contact me in 4 weeks with update.

## 2016-07-06 NOTE — Telephone Encounter (Signed)
Spoke w/ Rudie Meyer patient's kidney transplant coordinator @ [facility] [phone]), will c/b to advise if ok to order CTA Head w/ contrast limitations.

## 2016-07-06 NOTE — Telephone Encounter (Signed)
Rudie Meyer left vmail stating Dr. Lolita Patella approves patient having CTA Head w/ minimal contrast required. Recommends patient hydrates really well, the day before, day of, and day after imaging.  Would like to have last OV note and imaging results forwarded to office @ [phone]

## 2016-07-09 ENCOUNTER — Encounter: Payer: Self-pay | Admitting: Family Medicine

## 2016-07-12 ENCOUNTER — Ambulatory Visit (INDEPENDENT_AMBULATORY_CARE_PROVIDER_SITE_OTHER): Payer: Managed Care, Other (non HMO) | Admitting: *Deleted

## 2016-07-12 DIAGNOSIS — I420 Dilated cardiomyopathy: Secondary | ICD-10-CM

## 2016-07-12 DIAGNOSIS — E875 Hyperkalemia: Secondary | ICD-10-CM | POA: Insufficient documentation

## 2016-07-12 NOTE — Progress Notes (Signed)
Remote ICD transmission.   

## 2016-07-15 ENCOUNTER — Emergency Department (HOSPITAL_COMMUNITY): Payer: Managed Care, Other (non HMO)

## 2016-07-15 ENCOUNTER — Observation Stay (HOSPITAL_COMMUNITY)
Admission: EM | Admit: 2016-07-15 | Discharge: 2016-07-17 | Disposition: A | Discharge status: Home / Self Care | Payer: Managed Care, Other (non HMO) | Attending: Internal Medicine | Admitting: Internal Medicine

## 2016-07-15 ENCOUNTER — Encounter (HOSPITAL_COMMUNITY): Payer: Self-pay

## 2016-07-15 DIAGNOSIS — Z95810 Presence of automatic (implantable) cardiac defibrillator: Secondary | ICD-10-CM | POA: Insufficient documentation

## 2016-07-15 DIAGNOSIS — I132 Hypertensive heart and chronic kidney disease with heart failure and with stage 5 chronic kidney disease, or end stage renal disease: Secondary | ICD-10-CM | POA: Insufficient documentation

## 2016-07-15 DIAGNOSIS — K2100 Gastro-esophageal reflux disease with esophagitis, without bleeding: Secondary | ICD-10-CM | POA: Diagnosis present

## 2016-07-15 DIAGNOSIS — R042 Hemoptysis: Secondary | ICD-10-CM

## 2016-07-15 DIAGNOSIS — I25118 Atherosclerotic heart disease of native coronary artery with other forms of angina pectoris: Secondary | ICD-10-CM | POA: Diagnosis present

## 2016-07-15 DIAGNOSIS — E782 Mixed hyperlipidemia: Secondary | ICD-10-CM | POA: Diagnosis not present

## 2016-07-15 DIAGNOSIS — N186 End stage renal disease: Secondary | ICD-10-CM | POA: Insufficient documentation

## 2016-07-15 DIAGNOSIS — Z7952 Long term (current) use of systemic steroids: Secondary | ICD-10-CM | POA: Insufficient documentation

## 2016-07-15 DIAGNOSIS — I255 Ischemic cardiomyopathy: Secondary | ICD-10-CM | POA: Insufficient documentation

## 2016-07-15 DIAGNOSIS — K219 Gastro-esophageal reflux disease without esophagitis: Secondary | ICD-10-CM | POA: Diagnosis present

## 2016-07-15 DIAGNOSIS — I5022 Chronic systolic (congestive) heart failure: Secondary | ICD-10-CM | POA: Diagnosis not present

## 2016-07-15 DIAGNOSIS — M3214 Glomerular disease in systemic lupus erythematosus: Secondary | ICD-10-CM | POA: Insufficient documentation

## 2016-07-15 DIAGNOSIS — Z79899 Other long term (current) drug therapy: Secondary | ICD-10-CM | POA: Diagnosis not present

## 2016-07-15 DIAGNOSIS — J189 Pneumonia, unspecified organism: Secondary | ICD-10-CM | POA: Diagnosis not present

## 2016-07-15 DIAGNOSIS — Z955 Presence of coronary angioplasty implant and graft: Secondary | ICD-10-CM | POA: Insufficient documentation

## 2016-07-15 DIAGNOSIS — G5623 Lesion of ulnar nerve, bilateral upper limbs: Secondary | ICD-10-CM | POA: Diagnosis present

## 2016-07-15 DIAGNOSIS — I252 Old myocardial infarction: Secondary | ICD-10-CM | POA: Diagnosis not present

## 2016-07-15 DIAGNOSIS — R0989 Other specified symptoms and signs involving the circulatory and respiratory systems: Secondary | ICD-10-CM

## 2016-07-15 DIAGNOSIS — I2510 Atherosclerotic heart disease of native coronary artery without angina pectoris: Secondary | ICD-10-CM | POA: Diagnosis not present

## 2016-07-15 DIAGNOSIS — Z7982 Long term (current) use of aspirin: Secondary | ICD-10-CM | POA: Diagnosis not present

## 2016-07-15 DIAGNOSIS — Z87891 Personal history of nicotine dependence: Secondary | ICD-10-CM | POA: Insufficient documentation

## 2016-07-15 DIAGNOSIS — Z96642 Presence of left artificial hip joint: Secondary | ICD-10-CM | POA: Insufficient documentation

## 2016-07-15 DIAGNOSIS — Z7902 Long term (current) use of antithrombotics/antiplatelets: Secondary | ICD-10-CM | POA: Insufficient documentation

## 2016-07-15 DIAGNOSIS — Z940 Kidney transplant status: Secondary | ICD-10-CM | POA: Insufficient documentation

## 2016-07-15 DIAGNOSIS — I10 Essential (primary) hypertension: Secondary | ICD-10-CM | POA: Diagnosis present

## 2016-07-15 DIAGNOSIS — M329 Systemic lupus erythematosus, unspecified: Secondary | ICD-10-CM | POA: Diagnosis present

## 2016-07-15 HISTORY — DX: Heart failure, unspecified: I50.9

## 2016-07-15 LAB — COMPREHENSIVE METABOLIC PANEL
ALT: 19 U/L (ref 17–63)
AST: 27 U/L (ref 15–41)
Albumin: 3.8 g/dL (ref 3.5–5.0)
Alkaline Phosphatase: 79 U/L (ref 38–126)
Anion gap: 8 (ref 5–15)
BUN: 16 mg/dL (ref 6–20)
CHLORIDE: 106 mmol/L (ref 101–111)
CO2: 24 mmol/L (ref 22–32)
Calcium: 8.8 mg/dL — ABNORMAL LOW (ref 8.9–10.3)
Creatinine, Ser: 1.32 mg/dL — ABNORMAL HIGH (ref 0.61–1.24)
Glucose, Bld: 116 mg/dL — ABNORMAL HIGH (ref 65–99)
POTASSIUM: 3.9 mmol/L (ref 3.5–5.1)
SODIUM: 138 mmol/L (ref 135–145)
Total Bilirubin: 1.2 mg/dL (ref 0.3–1.2)
Total Protein: 6.1 g/dL — ABNORMAL LOW (ref 6.5–8.1)

## 2016-07-15 LAB — CBC WITH DIFFERENTIAL/PLATELET
Basophils Absolute: 0 10*3/uL (ref 0.0–0.1)
Basophils Relative: 0 %
Eosinophils Absolute: 0.4 10*3/uL (ref 0.0–0.7)
Eosinophils Relative: 3 %
HEMATOCRIT: 47.4 % (ref 39.0–52.0)
HEMOGLOBIN: 16 g/dL (ref 13.0–17.0)
LYMPHS ABS: 0.7 10*3/uL (ref 0.7–4.0)
LYMPHS PCT: 5 %
MCH: 31 pg (ref 26.0–34.0)
MCHC: 33.8 g/dL (ref 30.0–36.0)
MCV: 91.9 fL (ref 78.0–100.0)
Monocytes Absolute: 1.2 10*3/uL — ABNORMAL HIGH (ref 0.1–1.0)
Monocytes Relative: 9 %
NEUTROS PCT: 83 %
Neutro Abs: 11.1 10*3/uL — ABNORMAL HIGH (ref 1.7–7.7)
Platelets: 150 10*3/uL (ref 150–400)
RBC: 5.16 MIL/uL (ref 4.22–5.81)
RDW: 13.5 % (ref 11.5–15.5)
WBC: 13.5 10*3/uL — AB (ref 4.0–10.5)

## 2016-07-15 LAB — TROPONIN I
TROPONIN I: 0.04 ng/mL — AB (ref <0.03)
Troponin I: 0.03 ng/mL (ref <0.03)

## 2016-07-15 LAB — I-STAT CG4 LACTIC ACID, ED
Lactic Acid, Venous: 3.63 mmol/L (ref 0.5–1.9)
Lactic Acid, Venous: 1.03 mmol/L (ref 0.5–1.9)

## 2016-07-15 LAB — BRAIN NATRIURETIC PEPTIDE: B Natriuretic Peptide: 379.2 pg/mL — ABNORMAL HIGH (ref 0.0–100.0)

## 2016-07-15 LAB — LACTIC ACID, PLASMA: LACTIC ACID, VENOUS: 1 mmol/L (ref 0.5–1.9)

## 2016-07-15 MED ORDER — PANTOPRAZOLE SODIUM 40 MG IV SOLR
40.0000 mg | Freq: Two times a day (BID) | INTRAVENOUS | Status: DC
  Administered 2016-07-15 – 2016-07-16 (×2): 40 mg via INTRAVENOUS
  Filled 2016-07-15 (×2): qty 40

## 2016-07-15 MED ORDER — GABAPENTIN 300 MG PO CAPS
600.0000 mg | ORAL_CAPSULE | Freq: Two times a day (BID) | ORAL | Status: DC
  Administered 2016-07-15 – 2016-07-17 (×4): 600 mg via ORAL
  Filled 2016-07-15 (×4): qty 2

## 2016-07-15 MED ORDER — TACROLIMUS 0.5 MG PO CAPS
0.5000 mg | ORAL_CAPSULE | Freq: Every day | ORAL | Status: DC
  Administered 2016-07-15 – 2016-07-16 (×2): 0.5 mg via ORAL
  Filled 2016-07-15 (×2): qty 1

## 2016-07-15 MED ORDER — ONDANSETRON HCL 4 MG/2ML IJ SOLN
4.0000 mg | Freq: Once | INTRAMUSCULAR | Status: AC
  Administered 2016-07-15: 4 mg via INTRAVENOUS
  Filled 2016-07-15: qty 2

## 2016-07-15 MED ORDER — ACETAMINOPHEN 650 MG RE SUPP: 650.0000 mg | Freq: Four times a day (QID) | RECTAL | Status: DC | PRN

## 2016-07-15 MED ORDER — DEXTROSE 5 % IV SOLN: 1.0000 g | Freq: Once | INTRAVENOUS | Status: DC

## 2016-07-15 MED ORDER — SODIUM CHLORIDE 0.9% FLUSH
3.0000 mL | Freq: Two times a day (BID) | INTRAVENOUS | Status: DC
  Administered 2016-07-16 – 2016-07-17 (×2): 3 mL via INTRAVENOUS

## 2016-07-15 MED ORDER — AZITHROMYCIN 500 MG IV SOLR: 500.0000 mg | Freq: Once | INTRAVENOUS | Status: DC

## 2016-07-15 MED ORDER — ZOLPIDEM TARTRATE 5 MG PO TABS
5.0000 mg | ORAL_TABLET | Freq: Every evening | ORAL | Status: DC | PRN
  Administered 2016-07-15 – 2016-07-16 (×2): 5 mg via ORAL
  Filled 2016-07-15 (×2): qty 1

## 2016-07-15 MED ORDER — PIPERACILLIN-TAZOBACTAM 3.375 G IVPB: 3.3750 g | Freq: Three times a day (TID) | INTRAVENOUS | Status: DC

## 2016-07-15 MED ORDER — SENNOSIDES-DOCUSATE SODIUM 8.6-50 MG PO TABS: 1.0000 | ORAL_TABLET | Freq: Every evening | ORAL | Status: DC | PRN

## 2016-07-15 MED ORDER — TACROLIMUS 0.5 MG PO CAPS
0.5000 mg | ORAL_CAPSULE | ORAL | Status: DC
  Administered 2016-07-17: 0.5 mg via ORAL
  Filled 2016-07-15: qty 1

## 2016-07-15 MED ORDER — SUCRALFATE 1 GM/10ML PO SUSP
1.0000 g | Freq: Three times a day (TID) | ORAL | Status: DC
  Filled 2016-07-15: qty 10

## 2016-07-15 MED ORDER — ASPIRIN EC 81 MG PO TBEC
81.0000 mg | DELAYED_RELEASE_TABLET | Freq: Every day | ORAL | Status: DC
  Administered 2016-07-16 – 2016-07-17 (×2): 81 mg via ORAL
  Filled 2016-07-15 (×2): qty 1

## 2016-07-15 MED ORDER — MYCOPHENOLATE MOFETIL 250 MG PO CAPS
750.0000 mg | ORAL_CAPSULE | Freq: Two times a day (BID) | ORAL | Status: DC
  Administered 2016-07-15 – 2016-07-17 (×4): 750 mg via ORAL
  Filled 2016-07-15 (×4): qty 3

## 2016-07-15 MED ORDER — ACETAMINOPHEN 325 MG PO TABS
650.0000 mg | ORAL_TABLET | Freq: Four times a day (QID) | ORAL | Status: DC | PRN
Start: 2016-07-15 — End: 2016-07-17
  Administered 2016-07-16: 650 mg via ORAL
  Filled 2016-07-15: qty 2

## 2016-07-15 MED ORDER — DEXTROSE 5 % IV SOLN
1.0000 g | INTRAVENOUS | Status: DC
  Filled 2016-07-15: qty 10

## 2016-07-15 MED ORDER — GUAIFENESIN ER 600 MG PO TB12: 600.0000 mg | ORAL_TABLET | Freq: Two times a day (BID) | ORAL | Status: DC | PRN

## 2016-07-15 MED ORDER — DEXTROSE 5 % IV SOLN
500.0000 mg | INTRAVENOUS | Status: DC
  Filled 2016-07-15: qty 500

## 2016-07-15 MED ORDER — NEBIVOLOL HCL 5 MG PO TABS
5.0000 mg | ORAL_TABLET | Freq: Every day | ORAL | Status: DC
  Administered 2016-07-16 – 2016-07-17 (×2): 5 mg via ORAL
  Filled 2016-07-15 (×2): qty 1

## 2016-07-15 MED ORDER — TACROLIMUS 1 MG PO CAPS
1.0000 mg | ORAL_CAPSULE | ORAL | Status: DC
  Administered 2016-07-16: 1 mg via ORAL
  Filled 2016-07-15 (×2): qty 1

## 2016-07-15 MED ORDER — SODIUM CHLORIDE 0.9 % IV BOLUS (SEPSIS)
2000.0000 mL | Freq: Once | INTRAVENOUS | Status: AC
  Administered 2016-07-15: 2000 mL via INTRAVENOUS

## 2016-07-15 MED ORDER — CLOPIDOGREL BISULFATE 75 MG PO TABS
75.0000 mg | ORAL_TABLET | Freq: Every day | ORAL | Status: DC
  Administered 2016-07-16 – 2016-07-17 (×2): 75 mg via ORAL
  Filled 2016-07-15 (×2): qty 1

## 2016-07-15 MED ORDER — ALLOPURINOL 100 MG PO TABS
100.0000 mg | ORAL_TABLET | Freq: Every day | ORAL | Status: DC
  Administered 2016-07-16 – 2016-07-17 (×2): 100 mg via ORAL
  Filled 2016-07-15 (×2): qty 1

## 2016-07-15 MED ORDER — ACETAMINOPHEN 325 MG PO TABS
650.0000 mg | ORAL_TABLET | Freq: Once | ORAL | Status: AC
  Administered 2016-07-15: 650 mg via ORAL
  Filled 2016-07-15: qty 2

## 2016-07-15 MED ORDER — PIPERACILLIN-TAZOBACTAM 3.375 G IVPB
3.3750 g | Freq: Three times a day (TID) | INTRAVENOUS | Status: DC
  Administered 2016-07-15 – 2016-07-17 (×5): 3.375 g via INTRAVENOUS
  Filled 2016-07-15 (×8): qty 50

## 2016-07-15 MED ORDER — NIACIN ER (ANTIHYPERLIPIDEMIC) 500 MG PO TBCR
1000.0000 mg | EXTENDED_RELEASE_TABLET | Freq: Every day | ORAL | Status: DC
  Administered 2016-07-15 – 2016-07-16 (×2): 1000 mg via ORAL
  Filled 2016-07-15 (×4): qty 2

## 2016-07-15 MED ORDER — MAGNESIUM OXIDE 400 (241.3 MG) MG PO TABS
400.0000 mg | ORAL_TABLET | Freq: Every day | ORAL | Status: DC
  Administered 2016-07-16 – 2016-07-17 (×2): 400 mg via ORAL
  Filled 2016-07-15 (×2): qty 1

## 2016-07-15 MED ORDER — ALBUTEROL SULFATE (2.5 MG/3ML) 0.083% IN NEBU: 2.5000 mg | INHALATION_SOLUTION | RESPIRATORY_TRACT | Status: DC | PRN

## 2016-07-15 MED ORDER — VITAMIN D 1000 UNITS PO TABS
5000.0000 [IU] | ORAL_TABLET | ORAL | Status: DC
  Administered 2016-07-16: 5000 [IU] via ORAL
  Filled 2016-07-15: qty 5

## 2016-07-15 MED ORDER — ATORVASTATIN CALCIUM 80 MG PO TABS
80.0000 mg | ORAL_TABLET | Freq: Every day | ORAL | Status: DC
  Administered 2016-07-15 – 2016-07-16 (×2): 80 mg via ORAL
  Filled 2016-07-15 (×2): qty 1

## 2016-07-15 NOTE — ED Notes (Signed)
Dr Long at bedside

## 2016-07-15 NOTE — ED Provider Notes (Signed)
Emergency Department Provider Note   I have reviewed the triage vital signs and the nursing notes.   HISTORY  Chief Complaint Hemoptysis   HPI Roy Dyer is a 51 y.o. male with PMH of CAD, CKF s/p renal transplant, and GERD resents to the emergency department for evaluation of productive cough with some blood in the sputum. The patient states that he had an event where he felt like maybe he aspirated slightly and since that time he's had productive cough with some right red/clotted blood. He denies any fevers or shaking chills but he states he's not been feeling well in general. He states maybe feels like he is getting the flu. He has generalized weakness and some mild nausea. Black in his bowel movements. No vomiting or diarrhea. No radiation of symptoms. No modifying factors.    Past Medical History:  Diagnosis Date  . AICD (automatic cardioverter/defibrillator) present    Dr. Paschal Dopp follows remotely-yrly checks, Dr. Jonne Ply  . Avascular necrosis of bone of hip ([place]) 2010 surg   Left hip arthroplasty: from chronic systemic steroids taken for his Lupus  . Barrett's esophagus   . CAD (coronary artery disease)     stents RCA/Circ 2001, BMS to LAD 07/2010  . Cardiomyopathy    (Ischemic):  Chronic systolic dysfunction--  43/3295 EF 30-35%.   Single chamber ICD 11/20/10 ([name])  . CHF (congestive heart failure) ([place])   . Chronic headaches    Dr. [name] 06/2016: suspects tension HA's.  Plan is to get CT brain with hopefully CT angio brain if his renal MD's will allow the use of contrast.  His neurontin was increased to 600 mg bid.  Also got referred for eval for possible OSA.  [name] Chronic renal insufficiency, stage II (mild)    GFR 65 ml/min 07/19/15 at local renal f/u (Cr 1.29)  . End stage renal disease ([place]) 04/24/2011   Secondary to SLE: HD 06/2011-10/2012 (then got renal transplant)  . GERD (gastroesophageal reflux disease)    Hx of esoph stricture and dilatation.   +Barrett's esophagus+  . Gout    s/p renal transplant he was weened off of his allopurinol.  . Hiatal hernia   . History of renal transplant 11/10/2012   10/2012 [facility]   . Hypertension   . implantable cardiac defibrillator single chamber    Medtronic (due to low EF)  . Left ventricular thrombus 2012   Re-eval 02/2011 showed thrombus RESOLVED, so coumadin was d/c'd (was on it for 58mo)  . Lupus   . Myocardial infarction 2001 and 2012   3 stents and defib placed in July  . Post-transplant erythrocytosis    improved with ARB (valsartan)    Patient Active Problem List   Diagnosis Date Noted  . Pulmonary vascular congestion 07/15/2016  . Cough with hemoptysis 07/15/2016  . Hemoptysis 07/15/2016  . Poor sleep pattern 07/06/2016  . Gastroesophageal reflux disease with esophagitis   . GERD (gastroesophageal reflux disease) 12/31/2013  . History of renal transplant 09/02/2013  . PNA (pneumonia) 04/23/2013  . Palpitations 10/22/2012  . CAD (coronary artery disease) 09/12/2011  . Ulnar neuropathy of both upper extremities 07/30/2011  . Raynaud's disease 06/25/2011  . End stage renal disease ([place]) 04/24/2011  . Automatic implantable cardioverter-defibrillator in situ 03/07/2011  . Erectile dysfunction 10/23/2010  . Mixed hyperlipidemia 08/10/2009  . Essential hypertension, benign 08/10/2009  . ischemic cardiomyopathy status post anterolateral MI 02/01/2009  . LUPUS 02/01/2009    Past Surgical History:  Procedure  Laterality Date  . AV FISTULA PLACEMENT  03/28/2011   Procedure: ARTERIOVENOUS (AV) FISTULA CREATION;  Surgeon: Rosetta Posner, MD;  Location: [place];  Service: Vascular;  Laterality: Left;  Creation of Left radiocephallic cimino fistula  . AV FISTULA PLACEMENT  04/27/2011   Procedure: ARTERIOVENOUS (AV) FISTULA CREATION;  Surgeon: Rosetta Posner, MD;  Location: [place];  Service: Vascular;  Laterality: Left;  left basilic vein transposition  . CARDIAC CATHETERIZATION     08/2010  .  CARDIOVASCULAR STRESS TEST  07/2010; 03/2014   03/2014 showed large old infarct and EF 30-35% but no ischemia  . ESOPHAGOGASTRODUODENOSCOPY  02/2009   Done by Dr. Fuller Plan for hematemesis; esophagitis found.  Repeat recommended 09/2016, at which time he will also likely get his initial screening colonoscopy.  . ESOPHAGOGASTRODUODENOSCOPY (EGD) WITH PROPOFOL N/A 09/29/2015   REPEAT EGD RECOMMENDED 09/2016.  Barrett's esoph + mild chronic gastritis.  H pylori NEG. Procedure: ESOPHAGOGASTRODUODENOSCOPY (EGD) WITH PROPOFOL;  Surgeon: Jerene Bears, MD;  Location: [facility];  Service: Gastroenterology;  Laterality: N/A;  . INSERT / REPLACE / REMOVE PACEMAKER     medtronic        dr [name]    [place]   icd only  . KIDNEY TRANSPLANT  10/2012   [facility] nephrologist--Dr. Blair Heys  . PARTIAL HIP ARTHROPLASTY     left  . RENAL BIOPSY    . stents     05-2010 and 2- in 2010  . TOTAL HIP ARTHROPLASTY  07/09/2011   Procedure: TOTAL HIP ARTHROPLASTY;  Surgeon: Kerin Salen, MD;  Location: [place];  Service: Orthopedics;  Laterality: Right;  . TYMPANOSTOMY TUBE PLACEMENT  51 yrs old  . US ECHOCARDIOGRAPHY  12/2010; 02/2014;08/2014   02/2014 EF still 25-30%, increased PA pressures.  2016 EF 30-35%, sept/inf hypokinesis, mod tricusp regurg      Allergies Triptans  Family History  Problem Relation Age of Onset  . Kidney failure Mother   . Lupus Mother   . Stroke Mother   . Diabetes Mother     type 2  . Heart attack Father     X 7  . Hypertension Father   . Hyperlipidemia Father   . Heart attack Sister 54    X 1  . Hyperlipidemia Sister   . Hypertension Sister   . Heart disease Sister   . Heart attack Paternal Grandfather   . Heart disease Paternal Aunt   . Heart disease Paternal Uncle     Social History Social History  Substance Use Topics  . Smoking status: Former Smoker    Packs/day: 0.50    Years: 30.00    Types: Cigarettes    Quit date: 08/02/2010  . Smokeless tobacco: Never Used  .  Alcohol use No    Review of Systems  10-point ROS otherwise negative.  ____________________________________________   PHYSICAL EXAM:  VITAL SIGNS: ED Triage Vitals [07/15/16 1612]  Enc Vitals Group     BP 134/77     Pulse Rate 77     Resp 18     Temp 100 F (37.8 C)     Temp Source Oral     SpO2 97 %    Constitutional: Alert and oriented. Well appearing and in no acute distress. Eyes: Conjunctivae are normal.  Head: Atraumatic. Nose: No congestion/rhinnorhea. Mouth/Throat: Mucous membranes are moist.  Oropharynx non-erythematous. Neck: No stridor. Cardiovascular: Normal rate, regular rhythm. Good peripheral circulation. Grossly normal heart sounds.   Respiratory: Normal respiratory  effort.  No retractions. Lungs CTAB. Gastrointestinal: Soft and nontender. No distention.  Musculoskeletal: No lower extremity tenderness nor edema. No gross deformities of extremities. Neurologic:  Normal speech and language. No gross focal neurologic deficits are appreciated.  Skin:  Skin is warm, dry and intact. No rash noted. Psychiatric: Mood and affect are normal. Speech and behavior are normal.  ____________________________________________   LABS (all labs ordered are listed, but only abnormal results are displayed)  Labs Reviewed  COMPREHENSIVE METABOLIC PANEL - Abnormal; Notable for the following:       Result Value   Glucose, Bld 116 (*)    Creatinine, Ser 1.32 (*)    Calcium 8.8 (*)    Total Protein 6.1 (*)    All other components within normal limits  CBC WITH DIFFERENTIAL/PLATELET - Abnormal; Notable for the following:    WBC 13.5 (*)    Neutro Abs 11.1 (*)    Monocytes Absolute 1.2 (*)    All other components within normal limits  BRAIN NATRIURETIC PEPTIDE - Abnormal; Notable for the following:    B Natriuretic Peptide 379.2 (*)    All other components within normal limits  TROPONIN I - Abnormal; Notable for the following:    Troponin I 0.03 (*)    All other  components within normal limits  TROPONIN I - Abnormal; Notable for the following:    Troponin I 0.04 (*)    All other components within normal limits  I-STAT CG4 LACTIC ACID, ED - Abnormal; Notable for the following:    Lactic Acid, Venous 3.63 (*)    All other components within normal limits  CULTURE, BLOOD (ROUTINE X 2)  CULTURE, BLOOD (ROUTINE X 2)  CULTURE, EXPECTORATED SPUTUM-ASSESSMENT  CULTURE, BLOOD (ROUTINE X 2)  LACTIC ACID, PLASMA  TROPONIN I  MAGNESIUM  CBC  COMPREHENSIVE METABOLIC PANEL  PROTIME-INR  APTT  HIV ANTIBODY (ROUTINE TESTING)  I-STAT CG4 LACTIC ACID, ED  I-STAT CG4 LACTIC ACID, ED  I-STAT CG4 LACTIC ACID, ED   ____________________________________________  EKG   EKG Interpretation  Date/Time:  Sunday July 15 2016 16:20:57 EST Ventricular Rate:  80 PR Interval:  186 QRS Duration: 98 QT Interval:  368 QTC Calculation: 424 R Axis:   -52 Text Interpretation:  Sinus rhythm with frequent Premature ventricular complexes Possible Left atrial enlargement Left anterior fascicular block Inferior infarct , age undetermined Anterolateral infarct , age undetermined Abnormal ECG No STEMI.  Confirmed by Zissy Hamlett MD, Briyonna Omara [phone]) on 07/15/2016 6:59:15 PM       ____________________________________________  RADIOLOGY  Dg Chest 2 View  Result Date: 07/15/2016 CLINICAL DATA:  Acute onset of chills and hemoptysis. Not feeling well. Initial encounter. EXAM: CHEST  2 VIEW COMPARISON:  Chest radiograph performed 04/17/2013 FINDINGS: The lungs are well-aerated. Vascular congestion is noted. Mild left basilar opacity may reflect mild pneumonia, though not well seen on the lateral view. There is no evidence of pleural effusion or pneumothorax. The heart is normal in size; a right-sided AICD is noted, with a lead ending overlying the right ventricle. No acute osseous abnormalities are seen. IMPRESSION: Vascular congestion noted. Mild left basilar airspace opacity may reflect  mild pneumonia. Electronically Signed   By: Garald Balding M.D.   On: 07/15/2016 16:48    ____________________________________________   PROCEDURES  Procedure(s) performed:   Procedures  None ____________________________________________   INITIAL IMPRESSION / ASSESSMENT AND PLAN / ED COURSE  Pertinent labs & imaging results that were available during my care of the patient were  reviewed by me and considered in my medical decision making (see chart for details).  Patient resents to the emergency department for evaluation of productive cough with some blood and clot. He is on any depression medications with renal transplant at [facility]. He has no fever but has an elevated lactate at 3.6 and concern for pneumonia on chest x-ray. I started antibiotics, IV fluids, and nausea medication. Patient overall looks well he has no oxygen requirement but given his alcohol history and significant elevated lactate I will discuss admission with the hospitalist.  Discussed patient's case with hospitalist, Dr. Reesa Chew. Patient and family (if present) updated with plan. Care transferred to hospitalist service.  I reviewed all nursing notes, vitals, pertinent old records, EKGs, labs, imaging (as available).  ____________________________________________  FINAL CLINICAL IMPRESSION(S) / ED DIAGNOSES  Final diagnoses:  Community acquired pneumonia, unspecified laterality     MEDICATIONS GIVEN DURING THIS VISIT:  Medications  piperacillin-tazobactam (ZOSYN) IVPB 3.375 g (3.375 g Intravenous New Bag/Given 07/15/16 2009)  pantoprazole (PROTONIX) injection 40 mg (not administered)  magnesium oxide (MAG-OX) tablet 400 mg (not administered)  tacrolimus (PROGRAF) capsule 0.5 mg (not administered)  zolpidem (AMBIEN) tablet 5 mg (not administered)  clopidogrel (PLAVIX) tablet 75 mg (not administered)  atorvastatin (LIPITOR) tablet 80 mg (not administered)  nebivolol (BYSTOLIC) tablet 5 mg (not administered)    niacin (NIASPAN) CR tablet 1,000 mg (not administered)  aspirin EC tablet 81 mg (not administered)  sucralfate (CARAFATE) 1 GM/10ML suspension 1 g (not administered)  mycophenolate (CELLCEPT) capsule 750 mg (not administered)  allopurinol (ZYLOPRIM) tablet 100 mg (not administered)  cholecalciferol (VITAMIN D) tablet 5,000 Units (not administered)  gabapentin (NEURONTIN) capsule 600 mg (not administered)  sodium chloride flush (NS) 0.9 % injection 3 mL (not administered)  acetaminophen (TYLENOL) tablet 650 mg (not administered)    Or  acetaminophen (TYLENOL) suppository 650 mg (not administered)  senna-docusate (Senokot-S) tablet 1 tablet (not administered)  albuterol (PROVENTIL) (2.5 MG/3ML) 0.083% nebulizer solution 2.5 mg (not administered)  guaiFENesin (MUCINEX) 12 hr tablet 600 mg (not administered)  tacrolimus (PROGRAF) capsule 1 mg (not administered)  tacrolimus (PROGRAF) capsule 0.5 mg (not administered)  sodium chloride 0.9 % bolus 2,000 mL (2,000 mLs Intravenous New Bag/Given 07/15/16 1946)  ondansetron (ZOFRAN) injection 4 mg (4 mg Intravenous Given 07/15/16 1949)  acetaminophen (TYLENOL) tablet 650 mg (650 mg Oral Given 07/15/16 2022)     NEW OUTPATIENT MEDICATIONS STARTED DURING THIS VISIT:  None   Note:  This document was prepared using Dragon voice recognition software and may include unintentional dictation errors.  Nanda Quinton, MD Emergency Medicine  Margette Fast, MD 07/15/16 2245

## 2016-07-15 NOTE — Progress Notes (Signed)
Pharmacy Antibiotic Note  Roy Dyer is a 51 y.o. male admitted on 07/15/2016 with pneumonia.  Pharmacy has been consulted for zosyn dosing.  Plan: Zosyn 3.375g IV q8h (4 hour infusion).     Temp (24hrs), Avg:100 F (37.8 C), Min:100 F (37.8 C), Max:100 F (37.8 C)   Recent Labs Lab 07/15/16 1745 07/15/16 1810  WBC 13.5*  --   CREATININE 1.32*  --   LATICACIDVEN  --  3.63*    Estimated Creatinine Clearance: 76 mL/min (by C-G formula based on SCr of 1.32 mg/dL (H)).    Allergies  Allergen Reactions  . Triptans Palpitations    [place]   Levester Fresh, PharmD, BCPS, BCCCP Clinical Pharmacist 07/15/2016 7:37 PM

## 2016-07-15 NOTE — ED Notes (Signed)
Attempted to call report

## 2016-07-15 NOTE — H&P (Signed)
History and Physical    Roy Dyer:[id number] DOB: 1965-12-17 DOA: 07/15/2016  PCP: [name], MD  Patient coming from: Home  Chief Complaint: Hemoptysis and URI type of symptoms  HPI: Roy Dyer is a 51 y.o. 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 Patient states he has been suffering from severe gastritis/GERD causing esophagitis and Barrett's esophagus for several years. He typically gets regurgitation of food from his stomach about once or twice a week even though he takes precautionary measures but for the past 3 days he has been experiencing more reflux and regurgitation. Due to this he has been coughing severely and noted persistent bloody tinged sputum today. Soon after he started experiencing what he describes it as flu type symptoms with low-grade fevers, chills, myalgias and generalized weakness. Due to the symptoms and being on immunosuppressants he decided to come to the ER for evaluation. He has been following with nephrology routinely and states his renal function has been stable. He also follows with cardiologist routinely for his advanced cardiomyopathy.  ED Course: in the ER he was noted to have very mild leukocytosis and lactate of 3.6 otherwise relatively normal CMP. On the chest x-ray he was noted to have mild left basilar opacity concerns of possible pneumonia and some vascular congestion. Upon ambulation in the ER he reported of shortness of breath when walking down the hall which is not present at baseline for him. Patient was also given 2 L of normal saline bolus in the ED.  Review of Systems: As per HPI otherwise 10 point review of systems negative.    Past Medical History:  Diagnosis Date  . AICD (automatic cardioverter/defibrillator) present    Dr.  Paschal Dopp follows remotely-yrly checks, Dr. Jonne Ply  . Avascular necrosis of bone of hip ([place]) 2010 surg   Left hip arthroplasty: from chronic systemic steroids taken for his Lupus  . Barrett's esophagus   . CAD (coronary artery disease)     stents RCA/Circ 2001, BMS to LAD 07/2010  . Cardiomyopathy    (Ischemic):  Chronic systolic dysfunction--  86/5784 EF 30-35%.   Single chamber ICD 11/20/10 ([name])  . Chronic headaches    Dr. [name] 06/2016: suspects tension HA's.  Plan is to get CT brain with hopefully CT angio brain if his renal MD's will allow the use of contrast.  His neurontin was increased to 600 mg bid.  Also got referred for eval for possible OSA.  [name] Chronic renal insufficiency, stage II (mild)    GFR 65 ml/min 07/19/15 at local renal f/u (Cr 1.29)  . End stage renal disease ([place]) 04/24/2011   Secondary to SLE: HD 06/2011-10/2012 (then got renal transplant)  . GERD (gastroesophageal reflux disease)    Hx of esoph stricture and dilatation.  +Barrett's esophagus+  . Gout    s/p renal transplant he was weened off of his allopurinol.  . Hiatal hernia   . History of renal transplant 11/10/2012   10/2012 [facility]   . Hypertension   . implantable cardiac defibrillator single chamber    [employer]tr[employer] (due to low EF)  . Left ventricular thrombus 2012   Re-eval 02/2011 showed thrombus RESOLVED, so coumadin was d/c'd (was on it for 72mo)  . Lupus   . Myocardial infarction 2001 and 2012   3 stents and defib  placed in July  . Post-transplant erythrocytosis    improved with ARB (valsartan)    Past Surgical History:  Procedure Laterality Date  . AV FISTULA PLACEMENT  03/28/2011   Procedure: ARTERIOVENOUS (AV) FISTULA CREATION;  Surgeon: Rosetta Posner, MD;  Location: [place];  Service: Vascular;  Laterality: Left;  Creation of Left radiocephallic cimino fistula  . AV FISTULA PLACEMENT  04/27/2011   Procedure: ARTERIOVENOUS (AV) FISTULA CREATION;  Surgeon: Rosetta Posner, MD;  Location: [place];   Service: Vascular;  Laterality: Left;  left basilic vein transposition  . CARDIAC CATHETERIZATION     08/2010  . CARDIOVASCULAR STRESS TEST  07/2010; 03/2014   03/2014 showed large old infarct and EF 30-35% but no ischemia  . ESOPHAGOGASTRODUODENOSCOPY  02/2009   Done by Dr. Fuller Plan for hematemesis; esophagitis found.  Repeat recommended 09/2016, at which time he will also likely get his initial screening colonoscopy.  . ESOPHAGOGASTRODUODENOSCOPY (EGD) WITH PROPOFOL N/A 09/29/2015   REPEAT EGD RECOMMENDED 09/2016.  Barrett's esoph + mild chronic gastritis.  H pylori NEG. Procedure: ESOPHAGOGASTRODUODENOSCOPY (EGD) WITH PROPOFOL;  Surgeon: Jerene Bears, MD;  Location: [facility];  Service: Gastroenterology;  Laterality: N/A;  . INSERT / REPLACE / REMOVE PACEMAKER     medtronic        dr [name]    [place]   icd only  . KIDNEY TRANSPLANT  10/2012   [facility] nephrologist--Dr. Blair Heys  . PARTIAL HIP ARTHROPLASTY     left  . RENAL BIOPSY    . stents     05-2010 and 2- in 2010  . TOTAL HIP ARTHROPLASTY  07/09/2011   Procedure: TOTAL HIP ARTHROPLASTY;  Surgeon: Kerin Salen, MD;  Location: [place];  Service: Orthopedics;  Laterality: Right;  . TYMPANOSTOMY TUBE PLACEMENT  51 yrs old  . US ECHOCARDIOGRAPHY  12/2010; 02/2014;08/2014   02/2014 EF still 25-30%, increased PA pressures.  2016 EF 30-35%, sept/inf hypokinesis, mod tricusp regurg     reports that he quit smoking about 5 years ago. His smoking use included Cigarettes. He has a 15.00 pack-year smoking history. He has never used smokeless tobacco. He reports that he does not drink alcohol or use drugs.  Allergies  Allergen Reactions  . Triptans Palpitations    Maxalt    Family History  Problem Relation Age of Onset  . Kidney failure Mother   . Lupus Mother   . Stroke Mother   . Diabetes Mother     type 2  . Heart attack Father     X 7  . Hypertension Father   . Hyperlipidemia Father   . Heart attack Sister 11    X 1  .  Hyperlipidemia Sister   . Hypertension Sister   . Heart disease Sister   . Heart attack Paternal Grandfather   . Heart disease Paternal Aunt   . Heart disease Paternal Uncle      Prior to Admission medications   Medication Sig Start Date End Date Taking? Authorizing Provider  acetaminophen (TYLENOL) 500 MG tablet Take 1,000-1,500 mg by mouth every 6 (six) hours as needed for headache.    Historical Provider, MD  allopurinol (ZYLOPRIM) 100 MG tablet Take 1 tablet (100 mg total) by mouth daily. 03/22/16   [name], MD  aspirin EC 81 MG tablet Take 81 mg by mouth daily.    Historical Provider, MD  atorvastatin (LIPITOR) 80 MG tablet TAKE 1 TABLET BY MOUTH DAILY 08/23/15   Arnette Norris  H McGowen, MD  BYSTOLIC 5 MG tablet TAKE 1 TABLET BY MOUTH DAILY 08/23/15   [name], MD  Cholecalciferol (VITAMIN D-3) 5000 units TABS Take 1 tablet by mouth every other day.    Historical Provider, MD  clopidogrel (PLAVIX) 75 MG tablet TAKE 1 TABLET BY MOUTH DAILY 08/23/15   [name], MD  dexlansoprazole (DEXILANT) 60 MG capsule Take 1 capsule (60 mg total) by mouth daily before breakfast. 12/12/15   Jerene Bears, MD  gabapentin (NEURONTIN) 300 MG capsule Take 2 capsules (600 mg total) by mouth 2 (two) times daily. 07/06/16   Pieter Partridge, DO  magnesium oxide (MAG-OX) 400 MG tablet Take 400 mg by mouth daily.     Historical Provider, MD  mycophenolate (CELLCEPT) 250 MG capsule Take 3 capsules (750 mg total) by mouth 2 (two) times daily. 03/05/16   [name], MD  niacin (NIASPAN) 500 MG CR tablet TAKE 2 TABLETS BY MOUTH AT BEDTIME 08/23/15   [name], MD  NITROSTAT 0.4 MG SL tablet USE 1 TAB UNDER TONGUE EVERY 5 MINUTES UP TO 3 DOSES AS NEEDED 12/08/12   Josue Hector, MD  predniSONE (DELTASONE) 5 MG tablet TAKE 1 TABLET BY MOUTH DAILY 11/01/15   [name], MD  ranitidine (ZANTAC) 150 MG tablet Take 1 tablet (150 mg total) by mouth at bedtime. 12/12/15   Jerene Bears, MD    sucralfate (CARAFATE) 1 GM/10ML suspension Take 10 mLs (1 g total) by mouth 4 (four) times daily -  before meals and at bedtime. As needed 12/12/15   Jerene Bears, MD  tacrolimus (PROGRAF) 0.5 MG capsule Take 0.5-1 mg by mouth See admin instructions. Take one capsule twice daily on Tuesday, Thursday, Saturday and two capsules in the morning and one capsule in the evening on Sunday,  Monday, Wednesday, Friday.    Historical Provider, MD  valsartan (DIOVAN) 40 MG tablet TAKE 1 TABLET BY MOUTH DAILY 11/01/15   [name], MD  zolpidem (AMBIEN) 5 MG tablet Take 5 mg by mouth at bedtime as needed for sleep.    Historical Provider, MD    Physical Exam: Vitals:   07/15/16 1612  BP: 134/77  Pulse: 77  Resp: 18  Temp: 100 F (37.8 C)  TempSrc: Oral  SpO2: 97%      Constitutional: NAD, calm, comfortable Vitals:   07/15/16 1612  BP: 134/77  Pulse: 77  Resp: 18  Temp: 100 F (37.8 C)  TempSrc: Oral  SpO2: 97%   Eyes: PERRL, lids and conjunctivae normal ENMT: Mucous membranes are moist. Posterior pharynx clear of any exudate or lesions.Normal dentition.  Neck: normal, supple, no masses, no thyromegaly Respiratory: clear to auscultation bilaterally, no wheezing, no crackles. Normal respiratory effort. No accessory muscle use.  Cardiovascular: Regular rate and rhythm, no murmurs / rubs / gallops. No extremity edema. 2+ pedal pulses. No carotid bruits.  Abdomen: no tenderness, no masses palpated. No hepatosplenomegaly. Bowel sounds positive.  Musculoskeletal: no clubbing / cyanosis. No joint deformity upper and lower extremities. Good ROM, no contractures. Normal muscle tone.  Skin: no rashes, lesions, ulcers. No induration Neurologic: CN 2-12 grossly intact. Sensation intact, DTR normal. Strength 5/5 in all 4.  Psychiatric: Normal judgment and insight. Alert and oriented x 3. Normal mood.   Labs on Admission: I have personally reviewed following labs and imaging  studies  CBC:  Recent Labs Lab 07/15/16 1745  WBC 13.5*  NEUTROABS 11.1*  HGB 16.0  HCT 47.4  MCV 91.9  PLT 258   Basic Metabolic Panel:  Recent Labs Lab 07/15/16 1745  NA 138  K 3.9  CL 106  CO2 24  GLUCOSE 116*  BUN 16  CREATININE 1.32*  CALCIUM 8.8*   GFR: Estimated Creatinine Clearance: 76 mL/min (by C-G formula based on SCr of 1.32 mg/dL (H)). Liver Function Tests:  Recent Labs Lab 07/15/16 1745  AST 27  ALT 19  ALKPHOS 79  BILITOT 1.2  PROT 6.1*  ALBUMIN 3.8   No results for input(s): LIPASE, AMYLASE in the last 168 hours. No results for input(s): AMMONIA in the last 168 hours. Coagulation Profile: No results for input(s): INR, PROTIME in the last 168 hours. Cardiac Enzymes: No results for input(s): CKTOTAL, CKMB, CKMBINDEX, TROPONINI in the last 168 hours. BNP (last 3 results) No results for input(s): PROBNP in the last 8760 hours. HbA1C: No results for input(s): HGBA1C in the last 72 hours. CBG: No results for input(s): GLUCAP in the last 168 hours. Lipid Profile: No results for input(s): CHOL, HDL, LDLCALC, TRIG, CHOLHDL, LDLDIRECT in the last 72 hours. Thyroid Function Tests: No results for input(s): TSH, T4TOTAL, FREET4, T3FREE, THYROIDAB in the last 72 hours. Anemia Panel: No results for input(s): VITAMINB12, FOLATE, FERRITIN, TIBC, IRON, RETICCTPCT in the last 72 hours. Urine analysis:    Component Value Date/Time   COLORURINE YELLOW 07/02/2011 [place] 07/02/2011 1132   LABSPEC 1.012 07/02/2011 1132   PHURINE 6.0 07/02/2011 1132   GLUCOSEU 100 (A) 07/02/2011 1132   HGBUR TRACE (A) 07/02/2011 1132   BILIRUBINUR NEGATIVE 07/02/2011 1132   KETONESUR NEGATIVE 07/02/2011 1132   PROTEINUR >300 (A) 07/02/2011 1132   UROBILINOGEN 0.2 07/02/2011 1132   NITRITE NEGATIVE 07/02/2011 1132   LEUKOCYTESUR NEGATIVE 07/02/2011 1132   Sepsis Labs:  !!!!!!!!!!!!!!!!!!!!!!!!!!!!!!!!!!!!!!!!!!!! @LABRCNTIP (procalcitonin:4,lacticidven:4) )No results found for this or any previous visit (from the past 240 hour(s)).   Radiological Exams on Admission: Dg Chest 2 View  Result Date: 07/15/2016 CLINICAL DATA:  Acute onset of chills and hemoptysis. Not feeling well. Initial encounter. EXAM: CHEST  2 VIEW COMPARISON:  Chest radiograph performed 04/17/2013 FINDINGS: The lungs are well-aerated. Vascular congestion is noted. Mild left basilar opacity may reflect mild pneumonia, though not well seen on the lateral view. There is no evidence of pleural effusion or pneumothorax. The heart is normal in size; a right-sided AICD is noted, with a lead ending overlying the right ventricle. No acute osseous abnormalities are seen. IMPRESSION: Vascular congestion noted. Mild left basilar airspace opacity may reflect mild pneumonia. Electronically Signed   By: Garald Balding M.D.   On: 07/15/2016 16:48    EKG: Independently reviewed. Some PVCs  Assessment/Plan Principal Problem:   Cough with hemoptysis Active Problems:   Mixed hyperlipidemia   Essential hypertension, benign   ischemic cardiomyopathy status post anterolateral MI   LUPUS   Automatic implantable cardioverter-defibrillator in situ   Ulnar neuropathy of both upper extremities   CAD (coronary artery disease)   PNA (pneumonia)   History of renal transplant   GERD (gastroesophageal reflux disease)   Gastroesophageal reflux disease with esophagitis   Pulmonary vascular congestion   Hemoptysis   Cough with hemoptysis along with possible aspiration pneumonia -Admit for further care -Aspiration is from regurgitation of food and liquid due to severe reflux esophagitis/Barrett's esophagus? -Aspiration precaution. I don't think patient speech and swallow eval at this time, he can eat with caution -Monitor hemoglobin for now, check  coags. He's on aspirin and Plavix at home -Antitussin medication as  needed, PPI IV -Zosyn IV for now to cover for anaerobes as well. He is immunosuppressed -Supplemental oxygen as needed -Order sputum cultures and blood cultures. -Hold off more IV fluids at this time for elevated lactate due to possible signs of vascular congestion. He was already given 2 L of IV fluids in the ED, we'll recheck lactate  Mild dyspnea on exertion -Likely from slight vascular congestion on chest x-ray along with some aspiration pneumonia -check bnp. If elevated will consider giving IV Lasix. Otherwise there is no signs of volume overload on physical exam at this time.  -I don't think he needs an echocardiogram at this time. He had one done in December 2017 which showed ejection fraction of 30%. -Supplemental oxygen for now and supportive care  Palpitations -Some signs of PVC on his EKG -We will monitor his electrolytes and place him on telemetry  History of renal transplant in 2014 secondary to lupus -Continue prednisone, Prograf and CellCept -Avoid nephrotoxic drugs. Gentle hydration  Ischemic cardiomyopathy along with combined systolic and diastolic CHF with ejection fraction of 30%-compensated -Continue his home medications at this time  Hypertension -Controlled, continue home medicine.  GERD with esophagitis -Continue PPI  DVT prophylaxis: SCDs Code Status: Full Family Communication: Wife present at bedside Disposition Plan: To be determined Consults called: None needed at this time Admission status: Inpatient telemetry due to some palpitations   Ziomara Birenbaum Arsenio Loader MD [facility] Pager [phone]  If 7PM-7AM, please contact night-coverage [email or web address] Password [facility]  07/15/2016, 7:36 PM

## 2016-07-15 NOTE — ED Triage Notes (Signed)
Patient complains of not feeling well x 2 days. Reports chills and hemoptysis since am. Patient is a renal transplant and has had pneumonia in past. No hemoptysis nor cough on arrival, alert and oriented, NAD

## 2016-07-15 NOTE — ED Notes (Signed)
Two sets of blood cultures obtained before initiation of abx. Duplicate order for cultures placed by admitting.

## 2016-07-16 ENCOUNTER — Telehealth: Payer: Self-pay | Admitting: *Deleted

## 2016-07-16 DIAGNOSIS — I10 Essential (primary) hypertension: Secondary | ICD-10-CM

## 2016-07-16 DIAGNOSIS — R042 Hemoptysis: Secondary | ICD-10-CM | POA: Diagnosis not present

## 2016-07-16 LAB — HIV ANTIBODY (ROUTINE TESTING W REFLEX): HIV Screen 4th Generation wRfx: NONREACTIVE

## 2016-07-16 LAB — CBC
HCT: 44.6 % (ref 39.0–52.0)
Hemoglobin: 14.5 g/dL (ref 13.0–17.0)
MCH: 30.1 pg (ref 26.0–34.0)
MCHC: 32.5 g/dL (ref 30.0–36.0)
MCV: 92.5 fL (ref 78.0–100.0)
Platelets: 135 10*3/uL — ABNORMAL LOW (ref 150–400)
RBC: 4.82 MIL/uL (ref 4.22–5.81)
RDW: 13.8 % (ref 11.5–15.5)
WBC: 11.7 10*3/uL — ABNORMAL HIGH (ref 4.0–10.5)

## 2016-07-16 LAB — COMPREHENSIVE METABOLIC PANEL WITH GFR
ALT: 17 U/L (ref 17–63)
AST: 22 U/L (ref 15–41)
Albumin: 3.3 g/dL — ABNORMAL LOW (ref 3.5–5.0)
Alkaline Phosphatase: 67 U/L (ref 38–126)
Anion gap: 5 (ref 5–15)
BUN: 15 mg/dL (ref 6–20)
CO2: 26 mmol/L (ref 22–32)
Calcium: 8.4 mg/dL — ABNORMAL LOW (ref 8.9–10.3)
Chloride: 107 mmol/L (ref 101–111)
Creatinine, Ser: 1.4 mg/dL — ABNORMAL HIGH (ref 0.61–1.24)
GFR calc Af Amer: >60 mL/min
GFR calc non Af Amer: 58 mL/min — ABNORMAL LOW
Glucose, Bld: 90 mg/dL (ref 65–99)
Potassium: 4.3 mmol/L (ref 3.5–5.1)
Sodium: 138 mmol/L (ref 135–145)
Total Bilirubin: 2.3 mg/dL — ABNORMAL HIGH (ref 0.3–1.2)
Total Protein: 5.4 g/dL — ABNORMAL LOW (ref 6.5–8.1)

## 2016-07-16 LAB — GLUCOSE, CAPILLARY: Glucose-Capillary: 79 mg/dL (ref 65–99)

## 2016-07-16 LAB — APTT: aPTT: 35 seconds (ref 24–36)

## 2016-07-16 LAB — PROTIME-INR
INR: 1.25
PROTHROMBIN TIME: 15.8 s — AB (ref 11.4–15.2)

## 2016-07-16 LAB — TROPONIN I: Troponin I: 0.03 ng/mL

## 2016-07-16 LAB — MAGNESIUM: Magnesium: 1.5 mg/dL — ABNORMAL LOW (ref 1.7–2.4)

## 2016-07-16 MED ORDER — MAGNESIUM SULFATE 2 GM/50ML IV SOLN
2.0000 g | Freq: Once | INTRAVENOUS | Status: AC
  Administered 2016-07-16: 2 g via INTRAVENOUS
  Filled 2016-07-16: qty 50

## 2016-07-16 MED ORDER — PANTOPRAZOLE SODIUM 40 MG PO TBEC
40.0000 mg | DELAYED_RELEASE_TABLET | Freq: Two times a day (BID) | ORAL | Status: DC
  Administered 2016-07-16 – 2016-07-17 (×2): 40 mg via ORAL
  Filled 2016-07-16 (×2): qty 1

## 2016-07-16 NOTE — Progress Notes (Signed)
PROGRESS NOTE    Roy Dyer  HQI:[id number] DOB: 30-Nov-1965 DOA: 07/15/2016 PCP: [name], MD    Brief Narrative: Roy Dyer is a 51 y.o. 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  Assessment & Plan:   Principal Problem:   Cough with hemoptysis Active Problems:   Mixed hyperlipidemia   Essential hypertension, benign   ischemic cardiomyopathy status post anterolateral MI   LUPUS   Automatic implantable cardioverter-defibrillator in situ   Ulnar neuropathy of both upper extremities   CAD (coronary artery disease)   PNA (pneumonia)   History of renal transplant   GERD (gastroesophageal reflux disease)   Gastroesophageal reflux disease with esophagitis   Pulmonary vascular congestion   Hemoptysis   Hemoptysis, possibly from aspiration pneumonia:  Resolved. Started on IV zosyn. Comfortable on RA.  Po protonix BID.     H/O renal transplant:  Resume cell cept and prograf.  Baseline creatinine between 1.3 to 1.4.  Continue t monitor.    Hypertension: controlled.    CAD: no chest pain:  Resume aspirin and plavix.     DVT prophylaxis: scd's Code Status: (Full) Family Communication: none at bedside.  Disposition Plan: possibly in am.    Consultants:   None.    Procedures: none.    Antimicrobials: zosyn   Subjective: Cough and congestion improved.   Objective: Vitals:   07/15/16 2056 07/16/16 0646 07/16/16 0828 07/16/16 1820  BP: 118/68 114/63 (!) 102/56 111/63  Pulse: 79 72 67 67  Resp: 18 18 18 17   Temp: 99.1 F (37.3 C) 98.4 F (36.9 C) 97.6 F (36.4 C) 98.5 F (36.9 C)  TempSrc: Oral Oral Oral Oral  SpO2: 97% 95% 96% 96%  Weight: 100.2 kg (220 lb 14.4 oz)     Height: 5\' 6"  (1.676 m)       Intake/Output Summary (Last 24 hours)  at 07/16/16 1837 Last data filed at 07/16/16 1819  Gross per 24 hour  Intake             1110 ml  Output             1150 ml  Net              -40 ml   Filed Weights   07/15/16 2056  Weight: 100.2 kg (220 lb 14.4 oz)    Examination:  General exam: Appears calm and comfortable  Respiratory system: Clear to auscultation. Respiratory effort normal. Cardiovascular system: S1 & S2 heard, RRR. No JVD, murmurs, rubs, gallops or clicks. No pedal edema. Gastrointestinal system: Abdomen is nondistended, soft and nontender. No organomegaly or masses felt. Normal bowel sounds heard. Central nervous system: Alert and oriented. No focal neurological deficits. Extremities: Symmetric 5 x 5 power. Skin: No rashes, lesions or ulcers Psychiatry: Judgement and insight appear normal. Mood & affect appropriate.     Data Reviewed: I have personally reviewed following labs and imaging studies  CBC:  Recent Labs Lab 07/15/16 1745 07/16/16 0700  WBC 13.5* 11.7*  NEUTROABS 11.1*  --   HGB 16.0 14.5  HCT 47.4 44.6  MCV 91.9 92.5  PLT 150 132*   Basic Metabolic Panel:  Recent Labs Lab 07/15/16 1745 07/16/16 0700  NA 138 138  K 3.9 4.3  CL 106 107  CO2 24 26  GLUCOSE 116*  90  BUN 16 15  CREATININE 1.32* 1.40*  CALCIUM 8.8* 8.4*  MG  --  1.5*   GFR: Estimated Creatinine Clearance: 70.8 mL/min (by C-G formula based on SCr of 1.4 mg/dL (H)). Liver Function Tests:  Recent Labs Lab 07/15/16 1745 07/16/16 0700  AST 27 22  ALT 19 17  ALKPHOS 79 67  BILITOT 1.2 2.3*  PROT 6.1* 5.4*  ALBUMIN 3.8 3.3*   No results for input(s): LIPASE, AMYLASE in the last 168 hours. No results for input(s): AMMONIA in the last 168 hours. Coagulation Profile:  Recent Labs Lab 07/16/16 0700  INR 1.25   Cardiac Enzymes:  Recent Labs Lab 07/15/16 2005 07/15/16 2115 07/16/16 0700  TROPONINI 0.03* 0.04* 0.03*   BNP (last 3 results) No results for input(s): PROBNP in the last 8760  hours. HbA1C: No results for input(s): HGBA1C in the last 72 hours. CBG:  Recent Labs Lab 07/16/16 0826  GLUCAP 79   Lipid Profile: No results for input(s): CHOL, HDL, LDLCALC, TRIG, CHOLHDL, LDLDIRECT in the last 72 hours. Thyroid Function Tests: No results for input(s): TSH, T4TOTAL, FREET4, T3FREE, THYROIDAB in the last 72 hours. Anemia Panel: No results for input(s): VITAMINB12, FOLATE, FERRITIN, TIBC, IRON, RETICCTPCT in the last 72 hours. Sepsis Labs:  Recent Labs Lab 07/15/16 1810 07/15/16 2005 07/15/16 2022  LATICACIDVEN 3.63* 1.0 1.03    Recent Results (from the past 240 hour(s))  Blood Culture (routine x 2)     Status: None (Preliminary result)   Collection Time: 07/15/16  7:45 PM  Result Value Ref Range Status   Specimen Description BLOOD RIGHT ARM  Final   Special Requests AEROBIC BOTTLE ONLY  5CC  Final   Culture NO GROWTH < 24 HOURS  Final   Report Status PENDING  Incomplete  Culture, blood (routine x 2)     Status: None (Preliminary result)   Collection Time: 07/15/16  8:10 PM  Result Value Ref Range Status   Specimen Description BLOOD RIGHT HAND  Final   Special Requests IN PEDIATRIC BOTTLE 2CC  Final   Culture NO GROWTH < 24 HOURS  Final   Report Status PENDING  Incomplete         Radiology Studies: Dg Chest 2 View  Result Date: 07/15/2016 CLINICAL DATA:  Acute onset of chills and hemoptysis. Not feeling well. Initial encounter. EXAM: CHEST  2 VIEW COMPARISON:  Chest radiograph performed 04/17/2013 FINDINGS: The lungs are well-aerated. Vascular congestion is noted. Mild left basilar opacity may reflect mild pneumonia, though not well seen on the lateral view. There is no evidence of pleural effusion or pneumothorax. The heart is normal in size; a right-sided AICD is noted, with a lead ending overlying the right ventricle. No acute osseous abnormalities are seen. IMPRESSION: Vascular congestion noted. Mild left basilar airspace opacity may reflect mild  pneumonia. Electronically Signed   By: Garald Balding M.D.   On: 07/15/2016 16:48        Scheduled Meds: . allopurinol  100 mg Oral Daily  . aspirin EC  81 mg Oral Daily  . atorvastatin  80 mg Oral QHS  . cholecalciferol  5,000 Units Oral Once per day on Sun Mon Wed Fri  . clopidogrel  75 mg Oral Daily  . gabapentin  600 mg Oral BID  . magnesium oxide  400 mg Oral Daily  . mycophenolate  750 mg Oral BID  . nebivolol  5 mg Oral Daily  . niacin  1,000 mg Oral QHS  .  pantoprazole  40 mg Oral BID  . piperacillin-tazobactam (ZOSYN)  IV  3.375 g Intravenous Q8H  . sodium chloride flush  3 mL Intravenous Q12H  . sucralfate  1 g Oral TID AC & HS  . tacrolimus  0.5 mg Oral QHS  . [START ON 07/17/2016] tacrolimus  0.5 mg Oral Once per day on Tue Thu Sat  . tacrolimus  1 mg Oral Once per day on Sun Mon Wed Fri   Continuous Infusions:   LOS: 1 day    Time spent: 35 minutes.     Hosie Poisson, MD [facility] Pager [phone]  If 7PM-7AM, please contact night-coverage [email or web address] Password [facility] 07/16/2016, 6:37 PM

## 2016-07-16 NOTE — Telephone Encounter (Signed)
PLEASE NOTE: All timestamps contained within this report are represented as [place] Standard Time. CONFIDENTIALTY NOTICE: This fax transmission is intended only for the addressee. It contains information that is legally privileged, confidential or otherwise protected from use or disclosure. If you are not the intended recipient, you are strictly prohibited from reviewing, disclosing, copying using or disseminating any of this information or taking any action in reliance on or regarding this information. If you have received this fax in error, please notify us immediately by telephone so that we can arrange for its return to [place]. Phone: [phone], Toll-Free: [phone], Fax: [phone] Page: 1 of 2 Call Id: [id number] [place] Patient Name: Roy Dyer Gender: Male DOB: Apr 05, 1966 Age: 51 Y 3 M 27 D Return Phone Number: [phone] (Primary), [phone] (Secondary) City/State/Zip: [place] Client [facility] - Client Client Site [facility] Phys[facility] Crissie Sickles - MD Who Is Calling Patient / Member / Family / Caregiver Call Type Triage / Clinical Caller Name Dannell Relationship To Patient Self Return Phone Number [phone] (Primary) Chief Complaint Coughing Up Blood Reason for Call Symptomatic / Request for [place] states he has multiple medical problems, h/ o liver transplant on immunosuppressant therapy, cardiac h/o multiple MI's. Current symptoms: He has had heartburn with productive cough a little of which he believes he has aspirated. BP 140/83 (usually in the 120/80 range), pulse 80 (usual 60), temp 98.4. He just does not feel right, has chills. He is now coughing up blood. He is on anticoagulation. Thinks he has a chest cold, concerned about developing pneumonia. Caller states he is not having any  emergent situation and wants advice from MD. Nurse Assessment Nurse: [name], RN, Claiborne Billings Date/Time (Eastern Time): 07/15/2016 2:25:39 PM Confirm and document reason for call. If symptomatic, describe symptoms. ---Caller states he has acid reflux and sometimes he aspirates it at night. Today, he started coughing up blood. Does the PT have any chronic conditions? (i.e. diabetes, asthma, etc.) ---Yes List chronic conditions. ---Kidney transplant Lupus Multiple Heart attacks Guidelines Guideline Title Affirmed Question Coughing Up Blood History of prior "blood clot" in leg or lungs (i.e., deep vein thrombosis, pulmonary embolism) Disp. Time Eilene Ghazi Time) Disposition Final User 07/15/2016 2:30:44 PM Go to ED Now Yes [name], RN, Claiborne Billings Referrals GO TO FACILITY UNDECIDED Care Advice Given Per Guideline GO TO ED NOW: You need to be seen in the Emergency Department. Go to the ER at ___________ [place] now. Drive carefully. SAMPLE: Bring in a sample of the blood for testing (R/O false positive). BRING MEDICINES: * Please bring a list of your current medicines when you go to see the doctor. CARE ADVICE given per Coughing Up Blood guideline. PLEASE NOTE: All timestamps contained within this report are represented as [place] Standard Time. CONFIDENTIALTY NOTICE: This fax transmission is intended only for the addressee. It contains information that is legally privileged, confidential or otherwise protected from use or disclosure. If you are not the intended recipient, you are strictly prohibited from reviewing, disclosing, copying using or disseminating any of this information or taking any action in reliance on or regarding this information. If you have received this fax in error, please notify us immediately by telephone so that we can arrange for its return to [place]. Phone: [phone], Toll-Free: [phone], Fax: [phone] Page: 2 of 2 Call Id: [id number]

## 2016-07-16 NOTE — Telephone Encounter (Signed)
Pt went to ER yesterday (07/15/16), ER note in EMR. Pt was admitted to hospital.

## 2016-07-16 NOTE — Telephone Encounter (Signed)
Noted  

## 2016-07-17 ENCOUNTER — Encounter: Payer: Self-pay | Admitting: Cardiology

## 2016-07-17 DIAGNOSIS — R042 Hemoptysis: Secondary | ICD-10-CM | POA: Diagnosis not present

## 2016-07-17 LAB — CUP PACEART REMOTE DEVICE CHECK
Battery Voltage: 2.96 V
Brady Statistic RV Percent Paced: 0.09 %
HighPow Impedance: 304 Ohm
HighPow Impedance: 53 Ohm
HighPow Impedance: 77 Ohm
Implantable Lead Implant Date: 20120709
Implantable Lead Location: 753860
Implantable Lead Model: 6947
Implantable Pulse Generator Implant Date: 20120709
Lead Channel Impedance Value: 361 Ohm
Lead Channel Pacing Threshold Amplitude: 1.375 V
Lead Channel Setting Pacing Amplitude: 2.75 V
Lead Channel Setting Pacing Pulse Width: 0.4 ms
Lead Channel Setting Sensing Sensitivity: 0.3 mV
MDC IDC MSMT LEADCHNL RV PACING THRESHOLD PULSEWIDTH: 0.4 ms
MDC IDC MSMT LEADCHNL RV SENSING INTR AMPL: 15.375 mV
MDC IDC MSMT LEADCHNL RV SENSING INTR AMPL: 15.375 mV
MDC IDC SESS DTM: 20180301125846

## 2016-07-17 LAB — COMPREHENSIVE METABOLIC PANEL
ALK PHOS: 75 U/L (ref 38–126)
ALT: 15 U/L — ABNORMAL LOW (ref 17–63)
AST: 20 U/L (ref 15–41)
Albumin: 3.6 g/dL (ref 3.5–5.0)
Anion gap: 9 (ref 5–15)
BILIRUBIN TOTAL: 2.2 mg/dL — AB (ref 0.3–1.2)
BUN: 18 mg/dL (ref 6–20)
CO2: 26 mmol/L (ref 22–32)
Calcium: 9.2 mg/dL (ref 8.9–10.3)
Chloride: 105 mmol/L (ref 101–111)
Creatinine, Ser: 1.52 mg/dL — ABNORMAL HIGH (ref 0.61–1.24)
GFR calc non Af Amer: 52 mL/min — ABNORMAL LOW (ref >60)
Glucose, Bld: 90 mg/dL (ref 65–99)
Potassium: 4 mmol/L (ref 3.5–5.1)
SODIUM: 140 mmol/L (ref 135–145)
TOTAL PROTEIN: 5.9 g/dL — AB (ref 6.5–8.1)

## 2016-07-17 LAB — CBC
HEMATOCRIT: 45.7 % (ref 39.0–52.0)
HEMOGLOBIN: 15.2 g/dL (ref 13.0–17.0)
MCH: 30.6 pg (ref 26.0–34.0)
MCHC: 33.3 g/dL (ref 30.0–36.0)
MCV: 92.1 fL (ref 78.0–100.0)
Platelets: 151 10*3/uL (ref 150–400)
RBC: 4.96 MIL/uL (ref 4.22–5.81)
RDW: 13.5 % (ref 11.5–15.5)
WBC: 10.9 10*3/uL — ABNORMAL HIGH (ref 4.0–10.5)

## 2016-07-17 LAB — MAGNESIUM: Magnesium: 1.9 mg/dL (ref 1.7–2.4)

## 2016-07-17 LAB — GLUCOSE, CAPILLARY: Glucose-Capillary: 84 mg/dL (ref 65–99)

## 2016-07-17 MED ORDER — AMOXICILLIN-POT CLAVULANATE 875-125 MG PO TABS: 1.0000 | ORAL_TABLET | Freq: Two times a day (BID) | ORAL | Status: DC

## 2016-07-17 MED ORDER — GUAIFENESIN ER 600 MG PO TB12: 600.0000 mg | ORAL_TABLET | Freq: Two times a day (BID) | ORAL | 0 refills | Status: DC | PRN

## 2016-07-17 MED ORDER — AMOXICILLIN-POT CLAVULANATE 875-125 MG PO TABS: 1.0000 | ORAL_TABLET | Freq: Two times a day (BID) | ORAL | 0 refills | Status: DC

## 2016-07-17 MED ORDER — PREDNISONE 5 MG PO TABS: 5.0000 mg | ORAL_TABLET | Freq: Every day | ORAL | Status: DC

## 2016-07-17 NOTE — Progress Notes (Signed)
[name] to be D/C'd Home per MD order.  Discussed prescriptions and follow up appointments with the patient. Prescriptions given to patient, medication list explained in detail. Pt verbalized understanding.  Allergies as of 07/17/2016      Reactions   Triptans Palpitations   Reaction to Maxalt      Medication List    TAKE these medications   acetaminophen 500 MG tablet Commonly known as:  TYLENOL Take 1,000 mg by mouth every 6 (six) hours as needed for headache (pain).   allopurinol 100 MG tablet Commonly known as:  ZYLOPRIM Take 1 tablet (100 mg total) by mouth daily.   amoxicillin-clavulanate 875-125 MG tablet Commonly known as:  AUGMENTIN Take 1 tablet by mouth every 12 (twelve) hours.   aspirin EC 81 MG tablet Take 81 mg by mouth daily.   atorvastatin 80 MG tablet Commonly known as:  LIPITOR TAKE 1 TABLET BY MOUTH DAILY What changed:  See the new instructions.   BYSTOLIC 5 MG tablet Generic drug:  nebivolol TAKE 1 TABLET BY MOUTH DAILY   clopidogrel 75 MG tablet Commonly known as:  PLAVIX TAKE 1 TABLET BY MOUTH DAILY   dexlansoprazole 60 MG capsule Commonly known as:  DEXILANT Take 1 capsule (60 mg total) by mouth daily before breakfast.   gabapentin 300 MG capsule Commonly known as:  NEURONTIN Take 2 capsules (600 mg total) by mouth 2 (two) times daily.   guaiFENesin 600 MG 12 hr tablet Commonly known as:  MUCINEX Take 1 tablet (600 mg total) by mouth 2 (two) times daily as needed for cough.   magnesium oxide 400 MG tablet Commonly known as:  MAG-OX Take 400 mg by mouth daily with lunch.   mycophenolate 250 MG capsule Commonly known as:  CELLCEPT Take 3 capsules (750 mg total) by mouth 2 (two) times daily.   niacin 500 MG CR tablet Commonly known as:  NIASPAN TAKE 2 TABLETS BY MOUTH AT BEDTIME   NITROSTAT 0.4 MG SL tablet Generic drug:  nitroGLYCERIN USE 1 TAB UNDER TONGUE EVERY 5 MINUTES UP TO 3 DOSES AS NEEDED What changed:  See the new  instructions.   predniSONE 5 MG tablet Commonly known as:  DELTASONE TAKE 1 TABLET BY MOUTH DAILY   ranitidine 150 MG tablet Commonly known as:  ZANTAC Take 1 tablet (150 mg total) by mouth at bedtime.   sucralfate 1 GM/10ML suspension Commonly known as:  CARAFATE Take 10 mLs (1 g total) by mouth 4 (four) times daily -  before meals and at bedtime. As needed What changed:  when to take this  additional instructions   tacrolimus 0.5 MG capsule Commonly known as:  PROGRAF Take 0.5-1 mg by mouth See admin instructions. Take 1 capsule (0.5 mg) by mouth daily at bedtime; take 2 capsules (1 mg) Sunday, Monday, Wednesday, Friday mornings, take 1 capsule (0.5 mg) Tuesday, Thursday, Saturday mornings   valsartan 40 MG tablet Commonly known as:  DIOVAN TAKE 1 TABLET BY MOUTH DAILY What changed:  See the new instructions.   Vitamin D-3 5000 units Tabs Take 1 tablet by mouth 4 (four) times a week. Sunday, Monday, Wednesday, Friday morning   zolpidem 5 MG tablet Commonly known as:  AMBIEN Take 5 mg by mouth at bedtime as needed for sleep.       Vitals:   07/16/16 2027 07/17/16 0942  BP: 109/64 122/64  Pulse: 65 71  Resp: 18 18  Temp: 98.6 F (37 C) 98 F (36.7 C)  Skin clean, dry and intact without evidence of skin break down, no evidence of skin tears noted. IV catheter discontinued intact. Site without signs and symptoms of complications. Dressing and pressure applied. Pt denies pain at this time. No complaints noted.  An After Visit Summary was printed and given to the patient. Patient escorted via [place], and D/C home via private auto.  Haywood Lasso BSN, RN [facility] Phone [phone]

## 2016-07-18 ENCOUNTER — Telehealth: Payer: Self-pay

## 2016-07-18 NOTE — Telephone Encounter (Signed)
Noted  

## 2016-07-18 NOTE — Telephone Encounter (Signed)
Transition Care Management Follow-up Telephone Call   Date discharged?  07/17/2016   How have you been since you were released from the hospital? "i'm good"   Do you understand why you were in the hospital? yes, "pneumonia"   Do you understand the discharge instructions? yes   Where were you discharged to? Home.    Items Reviewed:  Medications reviewed: no. Patient states there were no changes to his home medications besides the addition of antibiotic.   Allergies reviewed: yes  Dietary changes reviewed: no changes.   Referrals reviewed: no referrals.    Functional Questionnaire:   Activities of Daily Living (ADLs):   He states they are independent in the following: ambulation, bathing and hygiene, feeding, continence, grooming, toileting and dressing States they require assistance with the following: none.   Any transportation issues/concerns?: no   Any patient concerns? no   Confirmed importance and date/time of follow-up visits scheduled yes  Provider Appointment booked with PCP 08/01/2016 @ 3pm.  Confirmed with patient if condition begins to worsen call PCP or go to the ER.  Patient was given the office number and encouraged to call back with question or concerns.  : yes

## 2016-07-20 ENCOUNTER — Other Ambulatory Visit: Payer: Self-pay | Admitting: *Deleted

## 2016-07-20 LAB — CULTURE, BLOOD (ROUTINE X 2)
Culture: NO GROWTH
Culture: NO GROWTH

## 2016-07-20 NOTE — Patient Outreach (Signed)
[facility]) Care Management  07/20/2016  Roy Dyer 12-05-1965 [id number]   Per [employer] RNCM [name], patient is currently being followed by internal [employer] CM, and no need for [facility] Care Management to outreach to patient.  [facility] RNCM will proceed with case closure, send closure due to other CM program request to [name] Management.     [name] [name], BSN, [place] Management [facility] Telephonic CM Phone: [phone] Fax: [phone]

## 2016-07-24 NOTE — Discharge Summary (Signed)
Physician Discharge Summary  Roy Dyer WCB:[id number] DOB: 30-Jan-1966 DOA: 07/15/2016  PCP: [name], MD  Admit date: 07/15/2016 Discharge date: 07/17/2016  Admitted From: Home Disposition:  HOme.   Recommendations for Outpatient Follow-up:  1. Follow up with PCP in 1-2 weeks 2. Please obtain BMP/CBC in one week Please follow up with nephrology as recommended.   Discharge Condition:stable.  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  Discharge Diagnoses:  Principal Problem:   Cough with hemoptysis Active Problems:   Mixed hyperlipidemia   Essential hypertension, benign   ischemic cardiomyopathy status post anterolateral MI   LUPUS   Automatic implantable cardioverter-defibrillator in situ   Ulnar neuropathy of both upper extremities   CAD (coronary artery disease)   PNA (pneumonia)   History of renal transplant   GERD (gastroesophageal reflux disease)   Gastroesophageal reflux disease with esophagitis   Pulmonary vascular congestion   Hemoptysis  Hemoptysis, possibly from aspiration pneumonia:  Resolved. Started on IV zosyn. Comfortable on RA. Discharged on augmentin to complete the course.  Po protonix BID.     H/O renal transplant:  Resume cell cept and prograf.  Baseline creatinine between 1.3 to 1.4.  Creatinine slightly elevated at 1.5. Recommend follow up with nephrology in one week.    Hypertension: controlled.    CAD: no chest pain:  Resume aspirin and plavix.   Discharge Instructions  Discharge Instructions    Diet - low sodium heart healthy    Complete by:  As directed    Discharge instructions    Complete by:  As directed    Follow up with PCP  And get repeat CXR IN 2 TO 4 WEEKS to evaluate for resolution of the pneumonia.  Please follow up with Dr Moshe Cipro in 2 days.     Allergies as of 07/17/2016      Reactions   Triptans Palpitations   Reaction to Maxalt      Medication List    TAKE these medications   acetaminophen 500 MG tablet Commonly known as:  TYLENOL Take 1,000 mg by mouth every 6 (six) hours as needed for headache (pain).   allopurinol 100 MG tablet Commonly known as:  ZYLOPRIM Take 1 tablet (100 mg total) by mouth daily.   amoxicillin-clavulanate 875-125 MG tablet Commonly known as:  AUGMENTIN Take 1 tablet by mouth every 12 (twelve) hours.   aspirin EC 81 MG tablet Take 81 mg by mouth daily.   atorvastatin 80 MG tablet Commonly known as:  LIPITOR TAKE 1 TABLET BY MOUTH DAILY What changed:  See the new instructions.   BYSTOLIC 5 MG tablet Generic drug:  nebivolol TAKE 1 TABLET BY MOUTH DAILY   clopidogrel 75 MG tablet Commonly known as:  PLAVIX TAKE 1 TABLET BY MOUTH DAILY   dexlansoprazole 60 MG capsule Commonly known as:  DEXILANT Take 1 capsule (60 mg total) by mouth daily before breakfast.   gabapentin 300 MG capsule Commonly known as:  NEURONTIN Take 2 capsules (600 mg total) by mouth 2 (two) times daily.   guaiFENesin 600 MG 12 hr tablet Commonly known as:  MUCINEX Take 1 tablet (600 mg total) by mouth 2 (two) times daily as needed for cough.   magnesium oxide 400 MG  tablet Commonly known as:  MAG-OX Take 400 mg by mouth daily with lunch.   mycophenolate 250 MG capsule Commonly known as:  CELLCEPT Take 3 capsules (750 mg total) by mouth 2 (two) times daily.   niacin 500 MG CR tablet Commonly known as:  NIASPAN TAKE 2 TABLETS BY MOUTH AT BEDTIME   NITROSTAT 0.4 MG SL tablet Generic drug:  nitroGLYCERIN USE 1 TAB UNDER TONGUE EVERY 5 MINUTES UP TO 3 DOSES AS NEEDED What changed:  See the new instructions.   predniSONE 5 MG tablet Commonly known as:   DELTASONE TAKE 1 TABLET BY MOUTH DAILY   ranitidine 150 MG tablet Commonly known as:  ZANTAC Take 1 tablet (150 mg total) by mouth at bedtime.   sucralfate 1 GM/10ML suspension Commonly known as:  CARAFATE Take 10 mLs (1 g total) by mouth 4 (four) times daily -  before meals and at bedtime. As needed What changed:  when to take this  additional instructions   tacrolimus 0.5 MG capsule Commonly known as:  PROGRAF Take 0.5-1 mg by mouth See admin instructions. Take 1 capsule (0.5 mg) by mouth daily at bedtime; take 2 capsules (1 mg) Sunday, Monday, Wednesday, Friday mornings, take 1 capsule (0.5 mg) Tuesday, Thursday, Saturday mornings   valsartan 40 MG tablet Commonly known as:  DIOVAN TAKE 1 TABLET BY MOUTH DAILY What changed:  See the new instructions.   Vitamin D-3 5000 units Tabs Take 1 tablet by mouth 4 (four) times a week. Sunday, Monday, Wednesday, Friday morning   zolpidem 5 MG tablet Commonly known as:  AMBIEN Take 5 mg by mouth at bedtime as needed for sleep.      Follow-up Information    MCGOWEN,PHILIP H, MD. Schedule an appointment as soon as possible for a visit in 1 week.   Specialty:  Family Medicine Why:  Appointment date on 08/01/2016 3:00pm  Contact information: [address] [place]  [address] [phone]          Allergies  Allergen Reactions  . Triptans Palpitations    Reaction to Maxalt    Consultations:  None.   Procedures/Studies: Dg Chest 2 View  Result Date: 07/15/2016 CLINICAL DATA:  Acute onset of chills and hemoptysis. Not feeling well. Initial encounter. EXAM: CHEST  2 VIEW COMPARISON:  Chest radiograph performed 04/17/2013 FINDINGS: The lungs are well-aerated. Vascular congestion is noted. Mild left basilar opacity may reflect mild pneumonia, though not well seen on the lateral view. There is no evidence of pleural effusion or pneumothorax. The heart is normal in size; a right-sided AICD is noted, with a lead ending  overlying the right ventricle. No acute osseous abnormalities are seen. IMPRESSION: Vascular congestion noted. Mild left basilar airspace opacity may reflect mild pneumonia. Electronically Signed   By: Garald Balding M.D.   On: 07/15/2016 16:48     Subjective:  No chest pain or sob.  Discharge Exam: Vitals:   07/16/16 2027 07/17/16 0942  BP: 109/64 122/64  Pulse: 65 71  Resp: 18 18  Temp: 98.6 F (37 C) 98 F (36.7 C)   Vitals:   07/16/16 0828 07/16/16 1820 07/16/16 2027 07/17/16 0942  BP: (!) 102/56 111/63 109/64 122/64  Pulse: 67 67 65 71  Resp: 18 17 18 18   Temp: 97.6 F (36.4 C) 98.5 F (36.9 C) 98.6 F (37 C) 98 F (36.7 C)  TempSrc: Oral Oral Oral Oral  SpO2: 96% 96% 95% 97%  Weight:  Height:        General: Pt is alert, awake, not in acute distress Cardiovascular: RRR, S1/S2 +, no rubs, no gallops Respiratory: CTA bilaterally, no wheezing, no rhonchi Abdominal: Soft, NT, ND, bowel sounds + Extremities: no edema, no cyanosis    The results of significant diagnostics from this hospitalization (including imaging, microbiology, ancillary and laboratory) are listed below for reference.     Microbiology: Recent Results (from the past 240 hour(s))  Blood Culture (routine x 2)     Status: None   Collection Time: 07/15/16  7:45 PM  Result Value Ref Range Status   Specimen Description BLOOD RIGHT ARM  Final   Special Requests AEROBIC BOTTLE ONLY  5CC  Final   Culture NO GROWTH 5 DAYS  Final   Report Status 07/20/2016 FINAL  Final  Culture, blood (routine x 2)     Status: None   Collection Time: 07/15/16  8:10 PM  Result Value Ref Range Status   Specimen Description BLOOD RIGHT HAND  Final   Special Requests IN PEDIATRIC BOTTLE 2CC  Final   Culture NO GROWTH 5 DAYS  Final   Report Status 07/20/2016 FINAL  Final     Labs: BNP (last 3 results)  Recent Labs  07/15/16 2005  BNP 628.3*   Basic Metabolic Panel: No results for input(s): NA, K, CL, CO2,  GLUCOSE, BUN, CREATININE, CALCIUM, MG, PHOS in the last 168 hours. Liver Function Tests: No results for input(s): AST, ALT, ALKPHOS, BILITOT, PROT, ALBUMIN in the last 168 hours. No results for input(s): LIPASE, AMYLASE in the last 168 hours. No results for input(s): AMMONIA in the last 168 hours. CBC: No results for input(s): WBC, NEUTROABS, HGB, HCT, MCV, PLT in the last 168 hours. Cardiac Enzymes: No results for input(s): CKTOTAL, CKMB, CKMBINDEX, TROPONINI in the last 168 hours. BNP: Invalid input(s): POCBNP CBG: No results for input(s): GLUCAP in the last 168 hours. D-Dimer No results for input(s): DDIMER in the last 72 hours. Hgb A1c No results for input(s): HGBA1C in the last 72 hours. Lipid Profile No results for input(s): CHOL, HDL, LDLCALC, TRIG, CHOLHDL, LDLDIRECT in the last 72 hours. Thyroid function studies No results for input(s): TSH, T4TOTAL, T3FREE, THYROIDAB in the last 72 hours.  Invalid input(s): FREET3 Anemia work up No results for input(s): VITAMINB12, FOLATE, FERRITIN, TIBC, IRON, RETICCTPCT in the last 72 hours. Urinalysis    Component Value Date/Time   COLORURINE YELLOW 07/02/2011 [place] 07/02/2011 1132   LABSPEC 1.012 07/02/2011 1132   PHURINE 6.0 07/02/2011 1132   GLUCOSEU 100 (A) 07/02/2011 1132   HGBUR TRACE (A) 07/02/2011 1132   BILIRUBINUR NEGATIVE 07/02/2011 1132   KETONESUR NEGATIVE 07/02/2011 1132   PROTEINUR >300 (A) 07/02/2011 1132   UROBILINOGEN 0.2 07/02/2011 1132   NITRITE NEGATIVE 07/02/2011 1132   LEUKOCYTESUR NEGATIVE 07/02/2011 1132   Sepsis Labs Invalid input(s): PROCALCITONIN,  WBC,  LACTICIDVEN Microbiology Recent Results (from the past 240 hour(s))  Blood Culture (routine x 2)     Status: None   Collection Time: 07/15/16  7:45 PM  Result Value Ref Range Status   Specimen Description BLOOD RIGHT ARM  Final   Special Requests AEROBIC BOTTLE ONLY  5CC  Final   Culture NO GROWTH 5 DAYS  Final   Report  Status 07/20/2016 FINAL  Final  Culture, blood (routine x 2)     Status: None   Collection Time: 07/15/16  8:10 PM  Result Value Ref Range Status  Specimen Description BLOOD RIGHT HAND  Final   Special Requests IN PEDIATRIC BOTTLE 2CC  Final   Culture NO GROWTH 5 DAYS  Final   Report Status 07/20/2016 FINAL  Final     Time coordinating discharge: Over 30 minutes  SIGNED:   Hosie Poisson, MD  [facility] 07/24/2016, 11:24 PM Pager   If 7PM-7AM, please contact night-coverage [email or web address] Password [email or web address]

## 2016-08-01 ENCOUNTER — Encounter: Payer: Self-pay | Admitting: Family Medicine

## 2016-08-01 ENCOUNTER — Ambulatory Visit (INDEPENDENT_AMBULATORY_CARE_PROVIDER_SITE_OTHER): Payer: Managed Care, Other (non HMO) | Admitting: Family Medicine

## 2016-08-01 VITALS — BP 109/66 | HR 65 | Temp 98.4°F | Resp 16 | Ht 67.0 in | Wt 222.8 lb

## 2016-08-01 DIAGNOSIS — D72828 Other elevated white blood cell count: Secondary | ICD-10-CM

## 2016-08-01 DIAGNOSIS — N183 Chronic kidney disease, stage 3 unspecified: Secondary | ICD-10-CM

## 2016-08-01 DIAGNOSIS — J690 Pneumonitis due to inhalation of food and vomit: Secondary | ICD-10-CM | POA: Diagnosis not present

## 2016-08-01 NOTE — Progress Notes (Signed)
Pre visit review using our clinic review tool, if applicable. No additional management support is needed unless otherwise documented below in the visit note. 

## 2016-08-01 NOTE — Progress Notes (Signed)
OFFICE VISIT  08/01/2016   CC:  Chief Complaint  Patient presents with  . Hospitalization Follow-up    TCM   HPI:    Patient is a 51 y.o. Caucasian male who presents for hospital follow up.  Reviewed entire hospital record today. Admitted 3/4-3/6, 2018.  Presented with cough with hemoptysis in the setting of recent URI symptoms, recent worsening of his GER with regurgitation, and ongoing ASA and Plavix use as rx'd.   It is felt that he had aspiration pneumonia as the cause of his symptoms. Treated with zosyn in hosp, d/c'd home to finish course of abx with augmentin.  It has been 15 days since d/c from hospital. He finished all his abx (3-4 d of oral).  His is still a little SOB with exertion.  No more cough or hemoptysis. No fever.   GERD regimen was not changed.  He has plans to f/u with his GI for repeat endoscopy and his first screening colonoscopy.  He was told by his nephrologist 1 week before admission and was told his labs looked great. GFR baseline in the 60s, Cr baseline 1.3-1.4.     Past Medical History:  Diagnosis Date  . AICD (automatic cardioverter/defibrillator) present    Dr. Paschal Dopp follows remotely-yrly checks, Dr. Jonne Ply  . Avascular necrosis of bone of hip ([place]) 2010 surg   Left hip arthroplasty: from chronic systemic steroids taken for his Lupus  . Barrett's esophagus   . CAD (coronary artery disease)     stents RCA/Circ 2001, BMS to LAD 07/2010  . Cardiomyopathy    (Ischemic):  Chronic systolic dysfunction--  01/7352 EF 30-35%.   Single chamber ICD 11/20/10 ([name])  . CHF (congestive heart failure) ([place])   . Chronic headaches    Dr. [name] 06/2016: suspects tension HA's.  Plan is to get CT brain with hopefully CT angio brain if his renal MD's will allow the use of contrast.  His neurontin was increased to 600 mg bid.  Also got referred for eval for possible OSA.  [name] Chronic renal insufficiency, stage II (mild)    GFR 65 ml/min 07/19/15 at local  renal f/u (Cr 1.29)  . End stage renal disease ([place]) 04/24/2011   Secondary to SLE: HD 06/2011-10/2012 (then got renal transplant)  . GERD (gastroesophageal reflux disease)    Hx of esoph stricture and dilatation.  +Barrett's esophagus+  . Gout    s/p renal transplant he was weened off of his allopurinol.  . Hiatal hernia   . History of renal transplant 11/10/2012   10/2012 [facility]   . Hypertension   . implantable cardiac defibrillator single chamber    Medtronic (due to low EF)  . Left ventricular thrombus 2012   Re-eval 02/2011 showed thrombus RESOLVED, so coumadin was d/c'd (was on it for 18mo)  . Lupus   . Myocardial infarction 2001 and 2012   3 stents and defib placed in July  . Post-transplant erythrocytosis    improved with ARB (valsartan)    Past Surgical History:  Procedure Laterality Date  . AV FISTULA PLACEMENT  03/28/2011   Procedure: ARTERIOVENOUS (AV) FISTULA CREATION;  Surgeon: Rosetta Posner, MD;  Location: [place];  Service: Vascular;  Laterality: Left;  Creation of Left radiocephallic cimino fistula  . AV FISTULA PLACEMENT  04/27/2011   Procedure: ARTERIOVENOUS (AV) FISTULA CREATION;  Surgeon: Rosetta Posner, MD;  Location: [place];  Service: Vascular;  Laterality: Left;  left basilic vein transposition  . CARDIAC CATHETERIZATION  08/2010  . CARDIOVASCULAR STRESS TEST  07/2010; 03/2014   03/2014 showed large old infarct and EF 30-35% but no ischemia  . ESOPHAGOGASTRODUODENOSCOPY  02/2009   Done by Dr. Fuller Plan for hematemesis; esophagitis found.  Repeat recommended 09/2016, at which time he will also likely get his initial screening colonoscopy.  . ESOPHAGOGASTRODUODENOSCOPY (EGD) WITH PROPOFOL N/A 09/29/2015   REPEAT EGD RECOMMENDED 09/2016.  Barrett's esoph + mild chronic gastritis.  H pylori NEG. Procedure: ESOPHAGOGASTRODUODENOSCOPY (EGD) WITH PROPOFOL;  Surgeon: Jerene Bears, MD;  Location: [facility];  Service: Gastroenterology;  Laterality: N/A;  . INSERT / REPLACE / REMOVE  PACEMAKER     medtronic        dr [name]    [facility]   icd only  . KIDNEY TRANSPLANT  10/2012   [facility] nephrologist--Dr. Blair Heys  . PARTIAL HIP ARTHROPLASTY     left  . RENAL BIOPSY    . stents     05-2010 and 2- in 2010  . TOTAL HIP ARTHROPLASTY  07/09/2011   Procedure: TOTAL HIP ARTHROPLASTY;  Surgeon: Kerin Salen, MD;  Location: [facility];  Service: Orthopedics;  Laterality: Right;  . TYMPANOSTOMY TUBE PLACEMENT  51 yrs old  . US ECHOCARDIOGRAPHY  12/2010; 02/2014;08/2014   02/2014 EF still 25-30%, increased PA pressures.  2016 EF 30-35%, sept/inf hypokinesis, mod tricusp regurg    Outpatient Medications Prior to Visit  Medication Sig Dispense Refill  . acetaminophen (TYLENOL) 500 MG tablet Take 1,000 mg by mouth every 6 (six) hours as needed for headache (pain).     [name] allopurinol (ZYLOPRIM) 100 MG tablet Take 1 tablet (100 mg total) by mouth daily. 90 tablet 3  . aspirin EC 81 MG tablet Take 81 mg by mouth daily.    [name] atorvastatin (LIPITOR) 80 MG tablet TAKE 1 TABLET BY MOUTH DAILY (Patient taking differently: TAKE 1 TABLET BY MOUTH DAILY AT BEDTIME) 90 tablet 3  . BYSTOLIC 5 MG tablet TAKE 1 TABLET BY MOUTH DAILY 90 tablet 3  . Cholecalciferol (VITAMIN D-3) 5000 units TABS Take 1 tablet by mouth 4 (four) times a week. Sunday, Monday, Wednesday, Friday morning    . clopidogrel (PLAVIX) 75 MG tablet TAKE 1 TABLET BY MOUTH DAILY 90 tablet 3  . dexlansoprazole (DEXILANT) 60 MG capsule Take 1 capsule (60 mg total) by mouth daily before breakfast. 90 capsule 3  . gabapentin (NEURONTIN) 300 MG capsule Take 2 capsules (600 mg total) by mouth 2 (two) times daily. 120 capsule 3  . magnesium oxide (MAG-OX) 400 MG tablet Take 400 mg by mouth daily with lunch.     . mycophenolate (CELLCEPT) 250 MG capsule Take 3 capsules (750 mg total) by mouth 2 (two) times daily. 180 capsule 0  . niacin (NIASPAN) 500 MG CR tablet TAKE 2 TABLETS BY MOUTH AT BEDTIME 180 tablet 3  . NITROSTAT 0.4 MG SL tablet USE  1 TAB UNDER TONGUE EVERY 5 MINUTES UP TO 3 DOSES AS NEEDED (Patient taking differently: USE 1 TAB UNDER TONGUE EVERY 5 MINUTES UP TO 3 DOSES AS NEEDED FOR CHEST PAIN) 25 tablet 2  . predniSONE (DELTASONE) 5 MG tablet TAKE 1 TABLET BY MOUTH DAILY 90 tablet 3  . ranitidine (ZANTAC) 150 MG tablet Take 1 tablet (150 mg total) by mouth at bedtime. 90 tablet 3  . sucralfate (CARAFATE) 1 GM/10ML suspension Take 10 mLs (1 g total) by mouth 4 (four) times daily -  before meals and at bedtime. As needed 2500  mL 3  . tacrolimus (PROGRAF) 0.5 MG capsule Take 0.5-1 mg by mouth See admin instructions. Take 1 capsule (0.5 mg) by mouth daily at bedtime; take 2 capsules (1 mg) Sunday, Monday, Wednesday, Friday mornings, take 1 capsule (0.5 mg) Tuesday, Thursday, Saturday mornings    . valsartan (DIOVAN) 40 MG tablet TAKE 1 TABLET BY MOUTH DAILY (Patient taking differently: TAKE 1 TABLET BY MOUTH DAILY AT BEDTIME) 90 tablet 3  . zolpidem (AMBIEN) 5 MG tablet Take 5 mg by mouth at bedtime as needed for sleep.    [name] amoxicillin-clavulanate (AUGMENTIN) 875-125 MG tablet Take 1 tablet by mouth every 12 (twelve) hours. (Patient not taking: Reported on 08/01/2016) 10 tablet 0  . guaiFENesin (MUCINEX) 600 MG 12 hr tablet Take 1 tablet (600 mg total) by mouth 2 (two) times daily as needed for cough. (Patient not taking: Reported on 08/01/2016) 10 tablet 0   No facility-administered medications prior to visit.     Allergies  Allergen Reactions  . Triptans Palpitations    Reaction to Maxalt    ROS As per HPI  PE: Blood pressure 109/66, pulse 65, temperature 98.4 F (36.9 C), temperature source Oral, resp. rate 16, height 5\' 7"  (1.702 m), weight 222 lb 12 oz (101 kg), SpO2 97 %. Gen: Alert, well appearing.  Patient is oriented to person, place, time, and situation. AFFECT: pleasant, lucid thought and speech. TDD:UKGU: no injection, icteris, swelling, or exudate.  EOMI, PERRLA. Mouth: lips without lesion/swelling.  Oral  mucosa pink and moist. Oropharynx without erythema, exudate, or swelling.  CV: RRR, no m/r/g.   LUNGS: CTA bilat, nonlabored resps, good aeration in all lung fields. EXT: no clubbing, cyanosis, or edema.   LABS:  No results found for: DDIMER.  BNP    Component Value Date/Time   BNP 379.2 (H) 07/15/2016 2005    ProBNP    Component Value Date/Time   PROBNP 5057.0 (H) 10/29/2010 0430   Lab Results  Component Value Date   CKTOTAL 96 10/29/2010   CKMB 3.1 10/29/2010   TROPONINI 0.03 (HH) 07/16/2016  In hosp, his troponin I was 0.03 to 0.04 consistently.    Chemistry      Component Value Date/Time   NA 140 07/17/2016 0542   K 4.0 07/17/2016 0542   CL 105 07/17/2016 0542   CO2 26 07/17/2016 0542   BUN 18 07/17/2016 0542   CREATININE 1.52 (H) 07/17/2016 0542      Component Value Date/Time   CALCIUM 9.2 07/17/2016 0542   CALCIUM 9.1 04/23/2011 0820   ALKPHOS 75 07/17/2016 0542   AST 20 07/17/2016 0542   ALT 15 (L) 07/17/2016 0542   BILITOT 2.2 (H) 07/17/2016 0542     Lab Results  Component Value Date   WBC 10.9 (H) 07/17/2016   HGB 15.2 07/17/2016   HCT 45.7 07/17/2016   MCV 92.1 07/17/2016   PLT 151 07/17/2016   Lactic Acid, Venous    Component Value Date/Time   LATICACIDVEN 1.03 07/15/2016 2022  Initial lactic acid at admission 07/15/16 was 3.63 (ULN is 1.9).  CXR PA/Lat 07/15/16: IMPRESSION: Vascular congestion noted. Mild left basilar airspace opacity may reflect mild pneumonia.  IMPRESSION AND PLAN:  1) Hospital f/u: Aspiration pneumonia due to GER/gastric contents. Clinically back to baseline. Order CXR PA/La in 4 wkst to f/u 07/15/16 CXR findings--document resolution.  2) Cr slightly up in hosp (1.5), and mild leukocytosis was present as well:  CBC and BMET today.  An After Visit Summary was  printed and given to the patient.  FOLLOW UP: Return for keep appt for 10/29/16.  Signed:  Crissie Sickles, MD           08/01/2016

## 2016-08-02 LAB — CBC WITH DIFFERENTIAL/PLATELET
BASOS PCT: 1 % (ref 0.0–3.0)
Basophils Absolute: 0.1 10*3/uL (ref 0.0–0.1)
EOS PCT: 1.1 % (ref 0.0–5.0)
Eosinophils Absolute: 0.1 10*3/uL (ref 0.0–0.7)
HCT: 49.7 % (ref 39.0–52.0)
HEMOGLOBIN: 16.4 g/dL (ref 13.0–17.0)
Lymphocytes Relative: 12.5 % (ref 12.0–46.0)
Lymphs Abs: 1.1 10*3/uL (ref 0.7–4.0)
MCHC: 33 g/dL (ref 30.0–36.0)
MCV: 93.6 fl (ref 78.0–100.0)
MONO ABS: 0.6 10*3/uL (ref 0.1–1.0)
MONOS PCT: 6 % (ref 3.0–12.0)
Neutro Abs: 7.3 10*3/uL (ref 1.4–7.7)
Neutrophils Relative %: 79.4 % — ABNORMAL HIGH (ref 43.0–77.0)
Platelets: 223 10*3/uL (ref 150.0–400.0)
RBC: 5.31 Mil/uL (ref 4.22–5.81)
RDW: 14.7 % (ref 11.5–15.5)
WBC: 9.2 10*3/uL (ref 4.0–10.5)

## 2016-08-02 LAB — BASIC METABOLIC PANEL
BUN: 15 mg/dL (ref 6–23)
CALCIUM: 9.7 mg/dL (ref 8.4–10.5)
CO2: 31 mEq/L (ref 19–32)
CREATININE: 1.26 mg/dL (ref 0.40–1.50)
Chloride: 102 mEq/L (ref 96–112)
GFR: 64.55 mL/min (ref >60.00)
GLUCOSE: 136 mg/dL — AB (ref 70–99)
Potassium: 5.5 mEq/L — ABNORMAL HIGH (ref 3.5–5.1)
SODIUM: 141 meq/L (ref 135–145)

## 2016-08-03 ENCOUNTER — Other Ambulatory Visit: Payer: Self-pay | Admitting: *Deleted

## 2016-08-03 DIAGNOSIS — E875 Hyperkalemia: Secondary | ICD-10-CM

## 2016-08-03 MED ORDER — FUROSEMIDE 20 MG PO TABS: 20.0000 mg | ORAL_TABLET | Freq: Every day | ORAL | 0 refills | Status: DC

## 2016-08-08 ENCOUNTER — Other Ambulatory Visit (INDEPENDENT_AMBULATORY_CARE_PROVIDER_SITE_OTHER): Payer: Managed Care, Other (non HMO)

## 2016-08-08 ENCOUNTER — Other Ambulatory Visit: Payer: Self-pay | Admitting: Family Medicine

## 2016-08-08 DIAGNOSIS — E875 Hyperkalemia: Secondary | ICD-10-CM | POA: Diagnosis not present

## 2016-08-08 DIAGNOSIS — N183 Chronic kidney disease, stage 3 unspecified: Secondary | ICD-10-CM

## 2016-08-08 LAB — BASIC METABOLIC PANEL
BUN: 15 mg/dL (ref 6–23)
CHLORIDE: 101 meq/L (ref 96–112)
CO2: 33 mEq/L — ABNORMAL HIGH (ref 19–32)
Calcium: 9.7 mg/dL (ref 8.4–10.5)
Creatinine, Ser: 1.46 mg/dL (ref 0.40–1.50)
GFR: 54.45 mL/min — ABNORMAL LOW (ref >60.00)
GLUCOSE: 118 mg/dL — AB (ref 70–99)
Potassium: 5.5 mEq/L — ABNORMAL HIGH (ref 3.5–5.1)
Sodium: 143 mEq/L (ref 135–145)

## 2016-08-08 MED ORDER — SODIUM POLYSTYRENE SULFONATE 15 GM/60ML PO SUSP: ORAL | 0 refills | Status: DC

## 2016-08-09 ENCOUNTER — Other Ambulatory Visit: Payer: Self-pay | Admitting: *Deleted

## 2016-08-09 ENCOUNTER — Telehealth: Payer: Self-pay | Admitting: Family Medicine

## 2016-08-09 ENCOUNTER — Encounter: Payer: Self-pay | Admitting: Family Medicine

## 2016-08-09 ENCOUNTER — Encounter: Payer: Self-pay | Admitting: Internal Medicine

## 2016-08-09 NOTE — Telephone Encounter (Signed)
LMOM for pt to return call to discuss.

## 2016-08-09 NOTE — Telephone Encounter (Signed)
I haven't spoken to pt about his recent lab results yet. Please let me know if there is going to be any change to the lab note. Thanks.

## 2016-08-09 NOTE — Telephone Encounter (Signed)
Pt advised and voiced understanding.   

## 2016-08-09 NOTE — Telephone Encounter (Signed)
Patient received notification from MyChart that his potassium is still high. Patient would be interested in replacing Gabapentin with a different medication to see if the Gabapentin is causing his potassium to be high. He also mentioned that he doesn't feel like the Gabapentin is that effective anyway.Please call his cell.

## 2016-08-09 NOTE — Telephone Encounter (Signed)
OK, tell pt I think this is a reasonable next step. He needs to ween off the gabapentin: take one 300 mg tab twic a day for 3d, then one 300 mg tab once a day for 3d, then stop. No change to previous result note.--thx

## 2016-08-13 ENCOUNTER — Other Ambulatory Visit (INDEPENDENT_AMBULATORY_CARE_PROVIDER_SITE_OTHER): Payer: Managed Care, Other (non HMO)

## 2016-08-13 ENCOUNTER — Ambulatory Visit (HOSPITAL_BASED_OUTPATIENT_CLINIC_OR_DEPARTMENT_OTHER)
Admission: RE | Admit: 2016-08-13 | Discharge: 2016-08-13 | Disposition: A | Discharge status: Home / Self Care | Payer: Managed Care, Other (non HMO) | Source: Ambulatory Visit | Attending: Family Medicine | Admitting: Family Medicine

## 2016-08-13 ENCOUNTER — Encounter: Payer: Self-pay | Admitting: *Deleted

## 2016-08-13 DIAGNOSIS — J690 Pneumonitis due to inhalation of food and vomit: Secondary | ICD-10-CM | POA: Insufficient documentation

## 2016-08-13 DIAGNOSIS — E875 Hyperkalemia: Secondary | ICD-10-CM

## 2016-08-13 DIAGNOSIS — I517 Cardiomegaly: Secondary | ICD-10-CM | POA: Insufficient documentation

## 2016-08-13 DIAGNOSIS — N183 Chronic kidney disease, stage 3 unspecified: Secondary | ICD-10-CM

## 2016-08-13 LAB — BASIC METABOLIC PANEL
BUN: 16 mg/dL (ref 6–23)
CALCIUM: 9.8 mg/dL (ref 8.4–10.5)
CHLORIDE: 104 meq/L (ref 96–112)
CO2: 33 mEq/L — ABNORMAL HIGH (ref 19–32)
CREATININE: 1.33 mg/dL (ref 0.40–1.50)
GFR: 60.64 mL/min (ref >60.00)
Glucose, Bld: 110 mg/dL — ABNORMAL HIGH (ref 70–99)
Potassium: 4.6 mEq/L (ref 3.5–5.1)
SODIUM: 144 meq/L (ref 135–145)

## 2016-08-14 ENCOUNTER — Encounter: Payer: Self-pay | Admitting: *Deleted

## 2016-08-14 ENCOUNTER — Other Ambulatory Visit: Payer: Self-pay | Admitting: *Deleted

## 2016-08-14 DIAGNOSIS — E875 Hyperkalemia: Secondary | ICD-10-CM

## 2016-08-14 DIAGNOSIS — N182 Chronic kidney disease, stage 2 (mild): Secondary | ICD-10-CM

## 2016-08-16 ENCOUNTER — Other Ambulatory Visit: Payer: Self-pay | Admitting: Family Medicine

## 2016-08-28 ENCOUNTER — Other Ambulatory Visit (INDEPENDENT_AMBULATORY_CARE_PROVIDER_SITE_OTHER): Payer: Managed Care, Other (non HMO)

## 2016-08-28 DIAGNOSIS — N182 Chronic kidney disease, stage 2 (mild): Secondary | ICD-10-CM | POA: Diagnosis not present

## 2016-08-28 DIAGNOSIS — E875 Hyperkalemia: Secondary | ICD-10-CM

## 2016-08-28 LAB — BASIC METABOLIC PANEL
BUN: 19 mg/dL (ref 6–23)
CALCIUM: 9.5 mg/dL (ref 8.4–10.5)
CO2: 31 mEq/L (ref 19–32)
Chloride: 104 mEq/L (ref 96–112)
Creatinine, Ser: 1.33 mg/dL (ref 0.40–1.50)
GFR: 60.63 mL/min (ref >60.00)
GLUCOSE: 107 mg/dL — AB (ref 70–99)
POTASSIUM: 4.7 meq/L (ref 3.5–5.1)
SODIUM: 142 meq/L (ref 135–145)

## 2016-08-29 ENCOUNTER — Encounter: Payer: Self-pay | Admitting: *Deleted

## 2016-09-25 ENCOUNTER — Other Ambulatory Visit: Payer: Self-pay | Admitting: Internal Medicine

## 2016-10-03 ENCOUNTER — Encounter: Payer: Self-pay | Admitting: Neurology

## 2016-10-03 ENCOUNTER — Ambulatory Visit (INDEPENDENT_AMBULATORY_CARE_PROVIDER_SITE_OTHER): Payer: Managed Care, Other (non HMO) | Admitting: Neurology

## 2016-10-03 VITALS — BP 130/86 | HR 77 | Ht 67.0 in | Wt 215.0 lb

## 2016-10-03 DIAGNOSIS — Z72820 Sleep deprivation: Secondary | ICD-10-CM | POA: Diagnosis not present

## 2016-10-03 DIAGNOSIS — R51 Headache: Secondary | ICD-10-CM | POA: Diagnosis not present

## 2016-10-03 DIAGNOSIS — R519 Headache, unspecified: Secondary | ICD-10-CM

## 2016-10-03 NOTE — Progress Notes (Signed)
NEUROLOGY FOLLOW UP OFFICE NOTE  Roy Dyer [id number]  HISTORY OF PRESENT ILLNESS: Roy Dyer is a 51 year old right-handed male with hypertension, ischemic cardiomyopathy, CAD, status post defibrillator, bilateral hip replacement, mixed hyperlipidemia, lupus with ESRD and renal transplant and history of viral meningitis who follows up for headache.  UPDATE: He stopped gabapentin because it caused increase in K (over 5).  It wasn't helping anyway.  Instead, he increased water intake and decreased caffeine intake.  He hasn't needed to take Tylenol Intensity:  3/10 Duration:  2 hours  Frequency:  Once a week Frequency of abortive medication: no Current NSAIDS:  no Current analgesics:  Tylenol (not needed) Current triptans:  no Current anti-emetic:  no Current muscle relaxants:  no Current anti-anxiolytic:  no Current sleep aide:  Ambien Current Antihypertensive medications:  Diovan, nebivolol Current Antidepressant medications:  no Current Anticonvulsant medications:  no Current Vitamins/Herbal/Supplements:  magnesium oxide 400mg  Current Antihistamines/Decongestants:  no Other therapy:  no Other medication:  ASA 81mg , Plavix, Prograf  Ordered CTA of head but insurance wouldn't cover it.  I had referred him for evaluation of OSA, but he didn't hear from anyone.   Caffeine:  Coffee daily Alcohol:  no Smoker:  no Diet:  Drinks two 16 oz bottle of water Exercise:  no Depression/anxiety:  Works as a Health visitor.  Work intensity is cyclic and stress reduced recently Sleep hygiene:  Poor.  Wakes up often  HISTORY:  Onset:  Started having headaches two years ago, which gradually increased in frequency over the past 6 months.  Initially, he developed them at the end of the day while at work but recently he would wake up with them too. Location:  right sided (retro-orbital and radiates to right ear) Quality:  Nonthrobbing, ache Initial Intensity:  Usually 3/10 Aura:  no Prodrome:   no Associated symptoms:  No nausea, vomiting, photophobia, phonophobia, visual disturbance.  Not a thunderclap headache or wakes up from sleep.  Denies fevers, confusion. Initial Duration:  2 hours Initial Frequency:  Initially 4 to 5 days a week but over past 1 to 2 months, about 5 to 10 days a month Frequency of abortive medication: 5 to 10 days a month. Triggers/exacerbating factors:  Stress.   Relieving factors:  Rubbing his neck but denies neck pain. Activity:  Usually able to function   Past NSAIDS:  no Past analgesics:  no Past abortive triptans:  Maxalt (palpitations) Past muscle relaxants:  no Past anti-emetic:  no Past antihypertensive medications:  furosemide, losartan, Imdur, metoprolol Past antidepressant medications:  no Past anticonvulsant medications:  gabapentin 600mg  twice daily (ineffective, caused hyperkalemia) Past vitamins/Herbal/Supplements:  no Other past therapies:  no   Family history of headache:  no  PAST MEDICAL HISTORY: Past Medical History:  Diagnosis Date  . AICD (automatic cardioverter/defibrillator) present    Dr. Paschal Dopp follows remotely-yrly checks, Dr. Jonne Ply  . Avascular necrosis of bone of hip ([place]) 2010 surg   Left hip arthroplasty: from chronic systemic steroids taken for his Lupus  . Barrett's esophagus   . CAD (coronary artery disease)     stents RCA/Circ 2001, BMS to LAD 07/2010  . Cardiomyopathy    (Ischemic):  Chronic systolic dysfunction--  40/1027 EF 30-35%.   Single chamber ICD 11/20/10 ([name])  . CHF (congestive heart failure) ([place])   . Chronic headaches    Dr. [name] 06/2016: suspects tension HA's.  Plan is to get CT brain with hopefully CT angio brain if his renal  MD's will allow the use of contrast.  His neurontin was increased to 600 mg bid.  Also got referred for eval for possible OSA.  [name] Chronic renal insufficiency, stage II (mild)    GFR 65 ml/min 07/19/15 at local renal f/u (Cr 1.29)  . End stage renal disease  ([place]) 04/24/2011   Secondary to SLE: HD 06/2011-10/2012 (then got renal transplant)  . GERD (gastroesophageal reflux disease)    Hx of esoph stricture and dilatation.  +Barrett's esophagus+  . Gout    s/p renal transplant he was weened off of his allopurinol.  . Hiatal hernia   . History of renal transplant 11/10/2012   10/2012 [facility]   . Hyperkalemia 07/2016  . Hypertension   . implantable cardiac defibrillator single chamber    Medtronic (due to low EF)  . Left ventricular thrombus 2012   Re-eval 02/2011 showed thrombus RESOLVED, so coumadin was d/c'd (was on it for 88mo)  . Lupus   . Myocardial infarction [facility]) 2001 and 2012   3 stents and defib placed in July  . Post-transplant erythrocytosis    improved with ARB (valsartan)    MEDICATIONS: Current Outpatient Prescriptions on File Prior to Visit  Medication Sig Dispense Refill  . acetaminophen (TYLENOL) 500 MG tablet Take 1,000 mg by mouth every 6 (six) hours as needed for headache (pain).     [name] allopurinol (ZYLOPRIM) 100 MG tablet Take 1 tablet (100 mg total) by mouth daily. 90 tablet 3  . aspirin EC 81 MG tablet Take 81 mg by mouth daily.    [name] atorvastatin (LIPITOR) 80 MG tablet TAKE 1 TABLET BY MOUTH DAILY 90 tablet 3  . BYSTOLIC 5 MG tablet TAKE 1 TABLET BY MOUTH DAILY 90 tablet 3  . Cholecalciferol (VITAMIN D-3) 5000 units TABS Take 1 tablet by mouth 4 (four) times a week. Sunday, Monday, Wednesday, Friday morning    . Cholecalciferol (VITAMIN D3) 5000 units CAPS TAKE 1 CAPSULE BY MOUTH DAILY 100 capsule 3  . clopidogrel (PLAVIX) 75 MG tablet TAKE 1 TABLET BY MOUTH DAILY 90 tablet 3  . DEXILANT 60 MG capsule TAKE 1 CAPSULE BY MOUTH DAILY BEFORE BREAKFAST 90 capsule 0  . magnesium oxide (MAG-OX) 400 MG tablet Take 400 mg by mouth daily with lunch.     . mycophenolate (CELLCEPT) 250 MG capsule Take 3 capsules (750 mg total) by mouth 2 (two) times daily. 180 capsule 0  . niacin (NIASPAN) 500 MG CR tablet TAKE 2 TABLETS BY MOUTH AT  BEDTIME 180 tablet 3  . NITROSTAT 0.4 MG SL tablet USE 1 TAB UNDER TONGUE EVERY 5 MINUTES UP TO 3 DOSES AS NEEDED (Patient taking differently: USE 1 TAB UNDER TONGUE EVERY 5 MINUTES UP TO 3 DOSES AS NEEDED FOR CHEST PAIN) 25 tablet 2  . predniSONE (DELTASONE) 5 MG tablet TAKE 1 TABLET BY MOUTH DAILY 90 tablet 3  . ranitidine (ZANTAC) 150 MG tablet TAKE 1 TABLET BY MOUTH AT BEDTIME 90 tablet 0  . sodium polystyrene (KAYEXALATE) 15 GM/60ML suspension 60 ml po qd x 2 days 120 mL 0  . sucralfate (CARAFATE) 1 GM/10ML suspension Take 10 mLs (1 g total) by mouth 4 (four) times daily -  before meals and at bedtime. As needed 2500 mL 3  . tacrolimus (PROGRAF) 0.5 MG capsule Take 0.5-1 mg by mouth See admin instructions. Take 1 capsule (0.5 mg) by mouth daily at bedtime; take 2 capsules (1 mg) Sunday, Monday, Wednesday, Friday mornings, take 1 capsule (0.5  mg) Tuesday, Thursday, Saturday mornings    . valsartan (DIOVAN) 40 MG tablet TAKE 1 TABLET BY MOUTH DAILY (Patient taking differently: TAKE 1 TABLET BY MOUTH DAILY AT BEDTIME) 90 tablet 3  . zolpidem (AMBIEN) 5 MG tablet Take 5 mg by mouth at bedtime as needed for sleep.    . [DISCONTINUED] calcium carbonate, dosed in mg elemental calcium, 1250 MG/5ML Take 5 mLs (500 mg of elemental calcium total) by mouth every 6 (six) hours as needed. 450 mL 2   No current facility-administered medications on file prior to visit.     ALLERGIES: Allergies  Allergen Reactions  . Triptans Palpitations    Reaction to Maxalt    FAMILY HISTORY: Family History  Problem Relation Age of Onset  . Kidney failure Mother   . Lupus Mother   . Stroke Mother   . Diabetes Mother        type 2  . Heart attack Father        X 7  . Hypertension Father   . Hyperlipidemia Father   . Heart attack Sister 46       X 1  . Hyperlipidemia Sister   . Hypertension Sister   . Heart disease Sister   . Heart attack Paternal Grandfather   . Heart disease Paternal Aunt   . Heart  disease Paternal Uncle     SOCIAL HISTORY: Social History   Social History  . Marital status: Married    Spouse name: N/A  . Number of children: N/A  . Years of education: N/A   Occupational History  . CAD Drafter  [employer]   Social History Main Topics  . Smoking status: Former Smoker    Packs/day: 0.50    Years: 30.00    Types: Cigarettes    Quit date: 08/02/2010  . Smokeless tobacco: Never Used  . Alcohol use No  . Drug use: No  . Sexual activity: Yes    Partners: Female   Other Topics Concern  . Not on file   Social History Narrative   Married, 1 teenage son and 1 teenage daughter.    Occupation: Printmaker.   15 pack-yr smoking hx, quit 07/2010.   Drug Use - no   No alcohol.             REVIEW OF SYSTEMS: Constitutional: No fevers, chills, or sweats, no generalized fatigue, change in appetite Eyes: No visual changes, double vision, eye pain Ear, nose and throat: No hearing loss, ear pain, nasal congestion, sore throat Cardiovascular: No chest pain, palpitations Respiratory:  No shortness of breath at rest or with exertion, wheezes GastrointestinaI: No nausea, vomiting, diarrhea, abdominal pain, fecal incontinence Genitourinary:  No dysuria, urinary retention or frequency Musculoskeletal:  No neck pain, back pain Integumentary: No rash, pruritus, skin lesions Neurological: as above Psychiatric: No depression, insomnia, anxiety Endocrine: No palpitations, fatigue, diaphoresis, mood swings, change in appetite, change in weight, increased thirst Hematologic/Lymphatic:  No purpura, petechiae. Allergic/Immunologic: no itchy/runny eyes, nasal congestion, recent allergic reactions, rashes  PHYSICAL EXAM: Vitals:   10/03/16 0908  BP: 130/86  Pulse: 77   General: No acute distress.  Patient appears well-groomed.  normal body habitus. Head:  Normocephalic/atraumatic Eyes:  Fundi examined but not visualized Neck: supple, no paraspinal tenderness, full  range of motion Heart:  Regular rate and rhythm Lungs:  Clear to auscultation bilaterally Back: No paraspinal tenderness Neurological Exam: alert and oriented to person, place, and time. Attention span and concentration intact, recent and  remote memory intact, fund of knowledge intact.  Speech fluent and not dysarthric, language intact.  CN II-XII intact. Bulk and tone normal, muscle strength 5/5 throughout.  Sensation to light touch, temperature and vibration intact.  Deep tendon reflexes 2+ throughout, toes downgoing.  Finger to nose and heel to shin testing intact.  Gait normal, Romberg negative.  IMPRESSION: Headache, improved.  I don't think we need to order CT at this point.  However, he should still be evaluated for OSA.  PLAN: 1.  Refer to sleep medicine for OSA evaluation.  If he doesn't hear from them in one week, he is to contact us. 2.  Continue hydration, decreased caffeine.  Try to increase exercise. 3.  Follow up in 4 or 5 months.  Metta Clines, DO  CC:  Shawnie Dapper, MD

## 2016-10-03 NOTE — Patient Instructions (Signed)
1.  We will refer you again for evaluation of sleep apnea 2.  I don't think you need CT at this point 3.  Follow up around September or October

## 2016-10-04 ENCOUNTER — Encounter: Payer: Self-pay | Admitting: Family Medicine

## 2016-10-11 ENCOUNTER — Ambulatory Visit (INDEPENDENT_AMBULATORY_CARE_PROVIDER_SITE_OTHER): Payer: Managed Care, Other (non HMO) | Admitting: *Deleted

## 2016-10-11 DIAGNOSIS — I255 Ischemic cardiomyopathy: Secondary | ICD-10-CM

## 2016-10-12 NOTE — Progress Notes (Signed)
Remote ICD transmission.   

## 2016-10-15 ENCOUNTER — Telehealth: Payer: Self-pay | Admitting: Internal Medicine

## 2016-10-15 NOTE — Telephone Encounter (Signed)
Pt calling to schedule ECL, states he has several health issues and has to be done at the hospital. Can pt be scheduled for a direct at the hospital or does he need to have an OV first. Please advise.

## 2016-10-16 NOTE — Telephone Encounter (Signed)
[place] for [facility] on Tuesday or Friday am in outpatient hospital block Will need communication and permission with cardiology/prescribing provider for Plavix hold 5 days before procedures EGD to followup hx of esophagitis and hx of Barrett's Colon is for screening

## 2016-10-17 LAB — CUP PACEART REMOTE DEVICE CHECK
Battery Voltage: 2.93 V
HIGH POWER IMPEDANCE MEASURED VALUE: 304 Ohm
HighPow Impedance: 57 Ohm
HighPow Impedance: 75 Ohm
Implantable Lead Location: 753860
Implantable Lead Model: 6947
Lead Channel Impedance Value: 399 Ohm
Lead Channel Sensing Intrinsic Amplitude: 16.5 mV
Lead Channel Sensing Intrinsic Amplitude: 16.5 mV
Lead Channel Setting Pacing Amplitude: 2.5 V
Lead Channel Setting Pacing Pulse Width: 0.4 ms
Lead Channel Setting Sensing Sensitivity: 0.3 mV
MDC IDC LEAD IMPLANT DT: 20120709
MDC IDC MSMT LEADCHNL RV PACING THRESHOLD AMPLITUDE: 1.125 V
MDC IDC MSMT LEADCHNL RV PACING THRESHOLD PULSEWIDTH: 0.4 ms
MDC IDC PG IMPLANT DT: 20120709
MDC IDC SESS DTM: 20180531114610
MDC IDC STAT BRADY RV PERCENT PACED: 0.29 %

## 2016-10-19 ENCOUNTER — Other Ambulatory Visit: Payer: Self-pay

## 2016-10-19 ENCOUNTER — Encounter: Payer: Self-pay | Admitting: Cardiology

## 2016-10-19 ENCOUNTER — Telehealth: Payer: Self-pay

## 2016-10-19 DIAGNOSIS — K2270 Barrett's esophagus without dysplasia: Secondary | ICD-10-CM

## 2016-10-19 DIAGNOSIS — Z1211 Encounter for screening for malignant neoplasm of colon: Secondary | ICD-10-CM

## 2016-10-19 NOTE — Telephone Encounter (Signed)
Pt scheduled for previsit 10/31/16@8am . ECL scheduled at [facility] 11/09/16@11 :15am. Pt to arrive there at 9:45am. Pt currently taking Plavix.   Dr. Johnsie Cancel, will it be ok for Mr. Colver to hold his Plavix for 5 days prior to his ECL? He is scheduled for an endo/colon 11/09/16. Please advise.

## 2016-10-19 NOTE — Telephone Encounter (Signed)
Ok to hold plavix for 5 days for endo procedures

## 2016-10-22 ENCOUNTER — Other Ambulatory Visit: Payer: Self-pay

## 2016-10-22 DIAGNOSIS — Z1211 Encounter for screening for malignant neoplasm of colon: Secondary | ICD-10-CM

## 2016-10-22 DIAGNOSIS — K2270 Barrett's esophagus without dysplasia: Secondary | ICD-10-CM

## 2016-10-22 NOTE — Telephone Encounter (Signed)
Noted, pt will be instructed at previsit 10/31/16.

## 2016-10-24 ENCOUNTER — Other Ambulatory Visit: Payer: Self-pay | Admitting: Family Medicine

## 2016-10-24 NOTE — Telephone Encounter (Signed)
[place] Delivery.  RF request for prednisone LOV: 08/01/16 Next ov: 10/29/16 Last written: 11/01/15 #90 w/ 3RF  Please advise. Thanks.   ____________________________  RF request for valsartan Last written: 11/01/15 #90 w/ 3RF

## 2016-10-29 ENCOUNTER — Encounter: Payer: Self-pay | Admitting: Family Medicine

## 2016-10-29 ENCOUNTER — Ambulatory Visit (INDEPENDENT_AMBULATORY_CARE_PROVIDER_SITE_OTHER): Payer: Managed Care, Other (non HMO) | Admitting: Family Medicine

## 2016-10-29 VITALS — BP 118/69 | HR 66 | Temp 97.8°F | Resp 16 | Ht 67.0 in | Wt 221.8 lb

## 2016-10-29 DIAGNOSIS — M3219 Other organ or system involvement in systemic lupus erythematosus: Secondary | ICD-10-CM

## 2016-10-29 DIAGNOSIS — G4733 Obstructive sleep apnea (adult) (pediatric): Secondary | ICD-10-CM | POA: Diagnosis not present

## 2016-10-29 DIAGNOSIS — G4489 Other headache syndrome: Secondary | ICD-10-CM

## 2016-10-29 DIAGNOSIS — E7800 Pure hypercholesterolemia, unspecified: Secondary | ICD-10-CM | POA: Diagnosis not present

## 2016-10-29 DIAGNOSIS — Z8719 Personal history of other diseases of the digestive system: Secondary | ICD-10-CM

## 2016-10-29 DIAGNOSIS — K219 Gastro-esophageal reflux disease without esophagitis: Secondary | ICD-10-CM | POA: Diagnosis not present

## 2016-10-29 DIAGNOSIS — N182 Chronic kidney disease, stage 2 (mild): Secondary | ICD-10-CM

## 2016-10-29 NOTE — Progress Notes (Signed)
OFFICE VISIT  10/29/2016   CC:  Chief Complaint  Patient presents with  . Follow-up    RCI, pt is not fasting.    HPI:    Patient is a 51 y.o. Caucasian male who presents for f/u Lupus, HLD,  CRI stage 2 with hx of kidney transplant. 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 HA's are better--has one about once per month: work stress less, cut back on caffeine and drinking a lot more water. Last f/u with Dr. [name] they decided on watchful waiting approach--no further w/u.  He IS going to get a sleep study to r/o sleep apnea, though.  No joint pains, no muscle aches, no rash, no unexplained fevers, no fatigue.  Had routine labs done at [facility] about 4-5 days ago ([facility])--pt has not received results yet.  F/u lipids with Dr. Clover Mealy recently were normal per pt report.  He gets EGD for f/u Barrett's soon, will also get screening colonoscopy at that time.  Has had about 3 weeks of URI with coughing/bronchitis.  Now URI sx's are gone.  He has been taking mucinex and this helps all day.  No fevers.  No SOB or wheezing.   Past Medical History:  Diagnosis Date  . AICD (automatic cardioverter/defibrillator) present    Dr. Paschal Dopp follows remotely-yrly checks, Dr. Jonne Ply  . Avascular necrosis of bone of hip ([place]) 2010 surg   Left hip arthroplasty: from chronic systemic steroids taken for his Lupus  . Barrett's esophagus   . CAD (coronary artery disease)     stents RCA/Circ 2001, BMS to LAD 07/2010  . Cardiomyopathy    (Ischemic):  Chronic systolic dysfunction--  29/5284 EF 30-35%.   Single chamber ICD 11/20/10 ([name])  . CHF (congestive heart failure) ([employer])   . Chronic headaches    Dr. [name] 06/2016: suspects tension HA's.  CT brain was considered (MRI was not approved by insurer) but ended up not being needed as of 09/2016.  His neurontin was increased to 600 mg bid but this caused hyperkalemia so he d/c'd  this med.  HA's improved with decre caffeine and incre water.   Also got referred for eval for possible OSA.  [name] Chronic renal insufficiency, stage II (mild)    GFR 65 ml/min 07/19/15 at local renal f/u (Cr 1.29)  . End stage renal disease ([place]) 04/24/2011   Secondary to SLE: HD 06/2011-10/2012 (then got renal transplant)  . GERD (gastroesophageal reflux disease)    Hx of esoph stricture and dilatation.  +Barrett's esophagus+  . Gout    s/p renal transplant he was weened off of his allopurinol.  . Hiatal hernia   . History of renal transplant 11/10/2012   10/2012 [facility]   . Hyperkalemia 07/2016  . Hypertension   . implantable cardiac defibrillator single chamber    Medtronic (due to low EF)  . Left ventricular thrombus 2012   Re-eval 02/2011 showed thrombus RESOLVED, so coumadin was d/c'd (was on it for 91mo)  . Lupus   . Myocardial infarction [facility]) 2001 and 2012   3 stents and defib placed in July  . Post-transplant erythrocytosis    improved with ARB (valsartan)    Past Surgical History:  Procedure Laterality Date  . AV FISTULA PLACEMENT  03/28/2011   Procedure: ARTERIOVENOUS (AV) FISTULA CREATION;  Surgeon: Rosetta Posner, MD;  Location: [place];  Service: Vascular;  Laterality: Left;  Creation of Left  radiocephallic cimino fistula  . AV FISTULA PLACEMENT  04/27/2011   Procedure: ARTERIOVENOUS (AV) FISTULA CREATION;  Surgeon: Rosetta Posner, MD;  Location: [place];  Service: Vascular;  Laterality: Left;  left basilic vein transposition  . CARDIAC CATHETERIZATION     08/2010  . CARDIOVASCULAR STRESS TEST  07/2010; 03/2014   03/2014 showed large old infarct and EF 30-35% but no ischemia  . ESOPHAGOGASTRODUODENOSCOPY  02/2009   Done by Dr. Fuller Plan for hematemesis; esophagitis found.  Repeat recommended 09/2016, at which time he will also likely get his initial screening colonoscopy.  . ESOPHAGOGASTRODUODENOSCOPY (EGD) WITH PROPOFOL N/A 09/29/2015   REPEAT EGD RECOMMENDED 09/2016.  Barrett's esoph + mild  chronic gastritis.  H pylori NEG. Procedure: ESOPHAGOGASTRODUODENOSCOPY (EGD) WITH PROPOFOL;  Surgeon: Jerene Bears, MD;  [facility];  Service: Gastroenterology;  Laterality: N/A;  . INSERT / REPLACE / REMOVE PACEMAKER     medtronic        dr [name]    [place]   icd only  . KIDNEY TRANSPLANT  10/2012   [facility] nephrologist--Dr. Blair Heys  . PARTIAL HIP ARTHROPLASTY     left  . RENAL BIOPSY    . stents     05-2010 and 2- in 2010  . TOTAL HIP ARTHROPLASTY  07/09/2011   Procedure: TOTAL HIP ARTHROPLASTY;  Surgeon: Kerin Salen, MD;  Location: [place];  Service: Orthopedics;  Laterality: Right;  . TYMPANOSTOMY TUBE PLACEMENT  51 yrs old  . US ECHOCARDIOGRAPHY  12/2010; 02/2014;08/2014   02/2014 EF still 25-30%, increased PA pressures.  2016 EF 30-35%, sept/inf hypokinesis, mod tricusp regurg    Outpatient Medications Prior to Visit  Medication Sig Dispense Refill  . acetaminophen (TYLENOL) 500 MG tablet Take 1,000 mg by mouth every 6 (six) hours as needed for headache (pain).     [name] allopurinol (ZYLOPRIM) 100 MG tablet Take 1 tablet (100 mg total) by mouth daily. 90 tablet 3  . aspirin EC 81 MG tablet Take 81 mg by mouth daily.    [name] atorvastatin (LIPITOR) 80 MG tablet TAKE 1 TABLET BY MOUTH DAILY 90 tablet 3  . BYSTOLIC 5 MG tablet TAKE 1 TABLET BY MOUTH DAILY 90 tablet 3  . Cholecalciferol (VITAMIN D-3) 5000 units TABS Take 1 tablet by mouth 4 (four) times a week. Sunday, Monday, Wednesday, Friday morning    . clopidogrel (PLAVIX) 75 MG tablet TAKE 1 TABLET BY MOUTH DAILY 90 tablet 3  . DEXILANT 60 MG capsule TAKE 1 CAPSULE BY MOUTH DAILY BEFORE BREAKFAST 90 capsule 0  . magnesium oxide (MAG-OX) 400 MG tablet Take 400 mg by mouth daily with lunch.     . mycophenolate (CELLCEPT) 250 MG capsule Take 3 capsules (750 mg total) by mouth 2 (two) times daily. 180 capsule 0  . niacin (NIASPAN) 500 MG CR tablet TAKE 2 TABLETS BY MOUTH AT BEDTIME 180 tablet 3  . NITROSTAT 0.4 MG SL tablet  USE 1 TAB UNDER TONGUE EVERY 5 MINUTES UP TO 3 DOSES AS NEEDED (Patient taking differently: USE 1 TAB UNDER TONGUE EVERY 5 MINUTES UP TO 3 DOSES AS NEEDED FOR CHEST PAIN) 25 tablet 2  . predniSONE (DELTASONE) 5 MG tablet TAKE 1 TABLET BY MOUTH DAILY 90 tablet 3  . ranitidine (ZANTAC) 150 MG tablet TAKE 1 TABLET BY MOUTH AT BEDTIME 90 tablet 0  . sucralfate (CARAFATE) 1 GM/10ML suspension Take 10 mLs (1 g total) by mouth 4 (four) times daily -  before meals  and at bedtime. As needed 2500 mL 3  . tacrolimus (PROGRAF) 0.5 MG capsule Take 0.5-1 mg by mouth See admin instructions. Take 1 capsule (0.5 mg) by mouth daily at bedtime; take 2 capsules (1 mg) Sunday, Monday, Wednesday, Friday mornings, take 1 capsule (0.5 mg) Tuesday, Thursday, Saturday mornings    . valsartan (DIOVAN) 40 MG tablet TAKE 1 TABLET BY MOUTH DAILY 90 tablet 1  . zolpidem (AMBIEN) 5 MG tablet Take 5 mg by mouth at bedtime as needed for sleep.    . Cholecalciferol (VITAMIN D3) 5000 units CAPS TAKE 1 CAPSULE BY MOUTH DAILY (Patient not taking: Reported on 10/29/2016) 100 capsule 3  . gabapentin (NEURONTIN) 300 MG capsule     . sodium polystyrene (KAYEXALATE) 15 GM/60ML suspension 60 ml po qd x 2 days (Patient not taking: Reported on 10/29/2016) 120 mL 0   No facility-administered medications prior to visit.     Allergies  Allergen Reactions  . Triptans Palpitations    Reaction to Maxalt    ROS As per HPI  PE: Blood pressure 118/69, pulse 66, temperature 97.8 F (36.6 C), temperature source Oral, resp. rate 16, height 5\' 7"  (1.702 m), weight 221 lb 12 oz (100.6 kg), SpO2 97 %. Gen: Alert, well appearing.  Patient is oriented to person, place, time, and situation. AFFECT: pleasant, lucid thought and speech. GUR:KYHC: no injection, icteris, swelling, or exudate.  EOMI, PERRLA. Mouth: lips without lesion/swelling.  Oral mucosa pink and moist. Oropharynx without erythema, exudate, or swelling.  CV: RRR, no m/r/g.   LUNGS: CTA  bilat, nonlabored resps, good aeration in all lung fields.  LABS:  Lab Results  Component Value Date   TSH 0.52 09/03/2013   Lab Results  Component Value Date   WBC 9.2 08/01/2016   HGB 16.4 08/01/2016   HCT 49.7 08/01/2016   MCV 93.6 08/01/2016   PLT 223.0 08/01/2016   Lab Results  Component Value Date   CREATININE 1.33 08/28/2016   BUN 19 08/28/2016   NA 142 08/28/2016   K 4.7 08/28/2016   CL 104 08/28/2016   CO2 31 08/28/2016   Lab Results  Component Value Date   ALT 15 (L) 07/17/2016   AST 20 07/17/2016   ALKPHOS 75 07/17/2016   BILITOT 2.2 (H) 07/17/2016   Lab Results  Component Value Date   CHOL 127 03/22/2015   Lab Results  Component Value Date   HDL 60.50 03/22/2015   Lab Results  Component Value Date   LDLCALC 53 03/22/2015   Lab Results  Component Value Date   TRIG 70.0 03/22/2015   Lab Results  Component Value Date   CHOLHDL 2 03/22/2015   Lab Results  Component Value Date   PSA 1.71 10/23/2010   Lab Results  Component Value Date   HGBA1C 5.6 04/23/2011    IMPRESSION AND PLAN:  1) Lupus; asymptomatic/quiescent at this time.  2) CRI stage 2, with hx of renal transplant: labs have been stable--he'll send me results of recent labs with [facility] transplant clinic.  3) HLD; tolerating statin.  I'll ask [facility] for his most recent labs from 06/2016 for cholesterol panel results.  4) GERD, hx of Barrett's esoph + colon ca screening: EGD and colonoscopy scheduled for later this month with [facility]. Pt denies any recent problems with excessive reflux/aspiration.  5) HA syndrome: MUCH better with alteration of caffeine and water intake.  6) OSA: suspicion of--neurology arranging for him to get sleep study.  An After  Visit Summary was printed and given to the patient.  FOLLOW UP: Return in about 4 months (around 02/28/2017) for routine chronic illness f/u.  Signed:  Crissie Sickles, MD           10/29/2016

## 2016-10-31 ENCOUNTER — Ambulatory Visit (AMBULATORY_SURGERY_CENTER): Payer: Self-pay | Admitting: *Deleted

## 2016-10-31 VITALS — Ht 67.0 in | Wt 222.4 lb

## 2016-10-31 DIAGNOSIS — Z8719 Personal history of other diseases of the digestive system: Secondary | ICD-10-CM

## 2016-10-31 DIAGNOSIS — Z1211 Encounter for screening for malignant neoplasm of colon: Secondary | ICD-10-CM

## 2016-10-31 MED ORDER — NA SULFATE-K SULFATE-MG SULF 17.5-3.13-1.6 GM/177ML PO SOLN: ORAL | 0 refills | Status: DC

## 2016-10-31 NOTE — Progress Notes (Signed)
No allergies to eggs or soy. No problems with anesthesia.  Pt given Emmi instructions for colonoscopy/egd  No oxygen use  No diet drug use

## 2016-11-09 ENCOUNTER — Ambulatory Visit (HOSPITAL_COMMUNITY): Admit: 2016-11-09 | Payer: Managed Care, Other (non HMO) | Admitting: Internal Medicine

## 2016-11-09 ENCOUNTER — Encounter (HOSPITAL_COMMUNITY): Payer: Self-pay

## 2016-11-09 SURGERY — COLONOSCOPY
Anesthesia: Monitor Anesthesia Care

## 2016-12-21 ENCOUNTER — Encounter: Payer: Self-pay | Admitting: Family Medicine

## 2016-12-21 NOTE — Telephone Encounter (Signed)
RF request for cellcept LOV: 10/29/16 Next ov: 02/27/17 Last written: 03/05/16 #180 w/ 0RF  Please advise. Thanks.

## 2016-12-23 MED ORDER — MYCOPHENOLATE MOFETIL 250 MG PO CAPS: 750.0000 mg | ORAL_CAPSULE | Freq: Two times a day (BID) | ORAL | 1 refills | Status: AC

## 2017-01-10 ENCOUNTER — Ambulatory Visit (INDEPENDENT_AMBULATORY_CARE_PROVIDER_SITE_OTHER): Payer: Managed Care, Other (non HMO) | Admitting: *Deleted

## 2017-01-10 DIAGNOSIS — I255 Ischemic cardiomyopathy: Secondary | ICD-10-CM | POA: Diagnosis not present

## 2017-01-10 NOTE — Progress Notes (Signed)
Remote ICD transmission.   

## 2017-01-17 ENCOUNTER — Encounter: Payer: Self-pay | Admitting: Family Medicine

## 2017-01-18 ENCOUNTER — Encounter: Payer: Self-pay | Admitting: Internal Medicine

## 2017-01-18 ENCOUNTER — Ambulatory Visit (INDEPENDENT_AMBULATORY_CARE_PROVIDER_SITE_OTHER): Payer: Managed Care, Other (non HMO) | Admitting: Internal Medicine

## 2017-01-18 VITALS — BP 128/80 | HR 67 | Ht 67.5 in | Wt 221.4 lb

## 2017-01-18 DIAGNOSIS — G4733 Obstructive sleep apnea (adult) (pediatric): Secondary | ICD-10-CM | POA: Diagnosis not present

## 2017-01-18 DIAGNOSIS — Z23 Encounter for immunization: Secondary | ICD-10-CM

## 2017-01-18 DIAGNOSIS — G478 Other sleep disorders: Secondary | ICD-10-CM | POA: Diagnosis not present

## 2017-01-18 DIAGNOSIS — Z9989 Dependence on other enabling machines and devices: Secondary | ICD-10-CM | POA: Insufficient documentation

## 2017-01-18 NOTE — Progress Notes (Signed)
[birth year] year old male former smoker for sleep evaluation. Referred courtesy of [facility]/ Dr Coy Saunas to have headaches; ? Sleep Apnea. Never had sleep study.  Medical problem list includes Lupus, HBP, CAD, MI/CM/CHF/AICD, renal transplant, GERD/Barrett's He complains of recurrent headaches, afternoons and especially on waking in the morning, getting more frequent. He is not aware of heavy snoring but wife tells him of witnessed apneas. Fairly heavy caffeine user through the day. No sleep medication. Sleeps with mouth open Bedtime between 10 and 11 PM estimating 15-20 minutes sleep latency, waking twice before up at 7 AM. He is not aware of parasomnias including movement disturbance. Sleeps with head of bed propped up and wedge pillow because of reflux Has had episodes of aspiration pneumonia in the past but not aware of ongoing lung disease. ENT surgery limited to myringotomy tubes. Has Ambien left over from use after kidney transplant, not used in years. 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 Epworth score 5/24  Prior to Admission medications   Medication Sig Start Date End Date Taking? Authorizing Provider  acetaminophen (TYLENOL) 500 MG tablet Take 1,000 mg by mouth every 6 (six) hours as needed for headache (pain).    Yes [provider]  allopurinol (ZYLOPRIM) 100 MG tablet Take 1 tablet (100 mg total) by mouth daily. 03/22/16  Yes McGowen, [name], MD  aspirin EC 81 MG tablet Take 81 mg by mouth daily.   Yes [provider]  atorvastatin (LIPITOR) 80 MG tablet TAKE 1 TABLET BY MOUTH DAILY 08/16/16  Yes McGowen, [name], MD  BYSTOLIC 5 MG tablet TAKE 1 TABLET BY MOUTH DAILY 08/16/16  Yes McGowen, [name], MD  Cholecalciferol (VITAMIN D-3) 5000 units TABS Take 1 tablet by mouth 4 (four) times a week. Sunday, Monday, Wednesday, Friday morning   Yes [provider]  clopidogrel (PLAVIX) 75 MG tablet TAKE 1 TABLET BY MOUTH DAILY 08/16/16  Yes McGowen,  [name], MD  DEXILANT 60 MG capsule TAKE 1 CAPSULE BY MOUTH DAILY BEFORE BREAKFAST 09/25/16  Yes Pyrtle, Lajuan Lines, MD  losartan (COZAAR) 25 MG tablet Take 1 tablet by mouth daily. 01/03/17  Yes [provider]  mycophenolate (CELLCEPT) 250 MG capsule Take 3 capsules (750 mg total) by mouth 2 (two) times daily. 12/23/16  Yes McGowen, [name], MD  niacin (NIASPAN) 500 MG CR tablet TAKE 2 TABLETS BY MOUTH AT BEDTIME 08/16/16  Yes McGowen, [name], MD  NITROSTAT 0.4 MG SL tablet USE 1 TAB UNDER TONGUE EVERY 5 MINUTES UP TO 3 DOSES AS NEEDED 12/08/12  Yes Josue Hector, MD  predniSONE (DELTASONE) 5 MG tablet TAKE 1 TABLET BY MOUTH DAILY 10/24/16  Yes McGowen, [name], MD  ranitidine (ZANTAC) 150 MG tablet TAKE 1 TABLET BY MOUTH AT BEDTIME 09/25/16  Yes Pyrtle, Lajuan Lines, MD  tacrolimus (PROGRAF) 0.5 MG capsule Take 0.5-1 mg by mouth See admin instructions. Take 1 capsule (0.5 mg) by mouth daily at bedtime; take 2 capsules (1 mg) Sunday, Monday, Wednesday, Friday mornings, take 1 capsule (0.5 mg) Tuesday, Thursday, Saturday mornings   Yes [provider]  zolpidem (AMBIEN) 5 MG tablet Take 5 mg by mouth at bedtime as needed for sleep.   Yes [provider]   Past Medical History:  Diagnosis Date  . AICD (automatic cardioverter/defibrillator) present    Dr. Paschal Dopp follows remotely-yrly checks, Dr. Jonne Ply  . Avascular necrosis of bone of hip ([place]) 2010 surg   Left hip arthroplasty: from chronic systemic  steroids taken for his Lupus  . Barrett's esophagus   . Blood transfusion without reported diagnosis 2010, 2012  . CAD (coronary artery disease)     stents RCA/Circ 2001, BMS to LAD 07/2010  . Cardiomyopathy    (Ischemic):  Chronic systolic dysfunction--  73/2202 EF 30-35%.   Single chamber ICD 11/20/10 ([name])  . Cataract    left eye  . CHF (congestive heart failure) ([place])   . Chronic headaches    Dr. [name] 06/2016: suspects tension HA's.  CT brain was considered (MRI  was not approved by insurer) but ended up not being needed as of 09/2016.  His neurontin was increased to 600 mg bid but this caused hyperkalemia so he d/c'd this med.  HA's improved with decre caffeine and incre water.   Also got referred for eval for possible OSA.  Renal MD suspicous that his polycyt may be cause HAs.  . Chronic renal insufficiency, stage II (mild)    GFR 65 ml/min 07/19/15 at local renal f/u (Cr 1.29).  GFR 64 ml/min (cr 1.3) at local renal f/u 01/16/17.  . End stage renal disease ([place]) 04/24/2011   Secondary to SLE: HD 06/2011-10/2012 (then got renal transplant)  . GERD (gastroesophageal reflux disease)    Hx of esoph stricture and dilatation.  +Barrett's esophagus+  . Gout    s/p renal transplant he was weened off of his allopurinol.  . Hiatal hernia   . History of renal transplant 11/10/2012   10/2012 [facility]   . Hyperkalemia 07/2016  . Hyperlipidemia   . Hypertension   . implantable cardiac defibrillator single chamber    Medtronic (due to low EF)  . Left ventricular thrombus 2012   Re-eval 02/2011 showed thrombus RESOLVED, so coumadin was d/c'd (was on it for 72mo)  . Lupus   . Myocardial infarction [facility]) 2001 and 2012   3 stents and defib placed in July  . Osteoporosis   . Post-transplant erythrocytosis    improved with ARB (valsartan)   Past Surgical History:  Procedure Laterality Date  . AV FISTULA PLACEMENT  03/28/2011   Procedure: ARTERIOVENOUS (AV) FISTULA CREATION;  Surgeon: Rosetta Posner, MD;  Location: [place];  Service: Vascular;  Laterality: Left;  Creation of Left radiocephallic cimino fistula  . AV FISTULA PLACEMENT  04/27/2011   Procedure: ARTERIOVENOUS (AV) FISTULA CREATION;  Surgeon: Rosetta Posner, MD;  Location: [place];  Service: Vascular;  Laterality: Left;  left basilic vein transposition  . CARDIAC CATHETERIZATION     08/2010  . CARDIOVASCULAR STRESS TEST  07/2010; 03/2014   03/2014 showed large old infarct and EF 30-35% but no ischemia  .  ESOPHAGOGASTRODUODENOSCOPY  02/2009   Done by Dr. Fuller Plan for hematemesis; esophagitis found.  Repeat recommended 09/2016, at which time he will also likely get his initial screening colonoscopy.  . ESOPHAGOGASTRODUODENOSCOPY (EGD) WITH PROPOFOL N/A 09/29/2015   REPEAT EGD RECOMMENDED 09/2016.  Barrett's esoph + mild chronic gastritis.  H pylori NEG. Procedure: ESOPHAGOGASTRODUODENOSCOPY (EGD) WITH PROPOFOL;  Surgeon: Jerene Bears, MD;  [facility];  Service: Gastroenterology;  Laterality: N/A;  . INSERT / REPLACE / REMOVE PACEMAKER     medtronic        dr [name]    [place]   icd only  . KIDNEY TRANSPLANT  10/2012   [facility] nephrologist--Dr. Blair Heys  . PARTIAL HIP ARTHROPLASTY Left 2010   left  . RENAL BIOPSY    . stents     05-2010  and 2- in 2010  . TOTAL HIP ARTHROPLASTY  07/09/2011   Procedure: TOTAL HIP ARTHROPLASTY;  Surgeon: Kerin Salen, MD;  Location: [place];  Service: Orthopedics;  Laterality: Right;  . TYMPANOSTOMY TUBE PLACEMENT  51 yrs old  . US ECHOCARDIOGRAPHY  12/2010; 02/2014;08/2014   02/2014 EF still 25-30%, increased PA pressures.  2016 EF 30-35%, sept/inf hypokinesis, mod tricusp regurg   Family History  Problem Relation Age of Onset  . Kidney failure Mother   . Lupus Mother   . Stroke Mother   . Diabetes Mother        type 2  . Heart attack Father        X 7  . Hypertension Father   . Hyperlipidemia Father   . Heart attack Sister 85       X 1  . Hyperlipidemia Sister   . Hypertension Sister   . Heart disease Sister   . Heart attack Paternal Grandfather   . Heart disease Paternal Aunt   . Heart disease Paternal Uncle   . Colon cancer Neg Hx   . Esophageal cancer Neg Hx   . Stomach cancer Neg Hx   . Rectal cancer Neg Hx    Social History   Social History  . Marital status: Married    Spouse name: N/A  . Number of children: N/A  . Years of education: N/A   Occupational History  . CAD Drafter  [employer]   Social History Main Topics  . Smoking  status: Former Smoker    Packs/day: 0.50    Years: 30.00    Types: Cigarettes    Quit date: 08/02/2010  . Smokeless tobacco: Never Used  . Alcohol use No  . Drug use: No  . Sexual activity: Yes    Partners: Female   Other Topics Concern  . Not on file   Social History Narrative   Married, 1 teenage son and 1 teenage daughter.    Occupation: Printmaker.   15 pack-yr smoking hx, quit 07/2010.   Drug Use - no   No alcohol.            ROS-see HPI   + = Positive Constitutional:    weight loss, night sweats, fevers, chills, fatigue, lassitude. HEENT:    + headaches, difficulty swallowing, tooth/dental problems, sore throat,       sneezing, itching, ear ache, nasal congestion, post nasal drip, snoring CV:    chest pain, orthopnea, PND, swelling in lower extremities, anasarca,                                                      dizziness, palpitations Resp:   shortness of breath with exertion or at rest.                productive cough,   non-productive cough, coughing up of blood.              change in color of mucus.  wheezing.   Skin:    rash or lesions. GI:  No-   heartburn, indigestion, abdominal pain, nausea, vomiting, diarrhea,                 change in bowel habits, loss of appetite GU: dysuria, change in color of urine, no urgency or frequency.   flank pain.  MS:   joint pain, stiffness, decreased range of motion, back pain. Neuro-     nothing unusual Psych:  change in mood or affect.  depression or anxiety.   memory loss.  OBJ- Physical Exam General- Alert, Oriented, Affect-appropriate, Distress- none acute, + overweight Skin- rash-none, lesions- none, excoriation- none Lymphadenopathy- none Head- atraumatic            Eyes- Gross vision intact, PERRLA, conjunctivae and secretions clear            Ears- Hearing, canals-normal            Nose- Clear, no-Septal dev, mucus, polyps, erosion, perforation             Throat- Mallampati II , mucosa clear , drainage-  none, tonsils- atrophic Neck- flexible , trachea midline, no stridor , thyroid nl, carotid no bruit Chest - symmetrical excursion , unlabored           Heart/CV- RRR , no murmur , no gallop  , no rub, nl s1 s2                           - JVD- none , edema- none, stasis changes- none, varices- none           Lung- clear to P&A, wheeze- none, cough- none , dullness-none, rub- none           Chest wall- + AICD R Abd-  Br/ Gen/ Rectal- Not done, not indicated Extrem- cyanosis- none, clubbing, none, atrophy- none, strength- nl Neuro- grossly intact to observation

## 2017-01-18 NOTE — Patient Instructions (Signed)
Order- schedule unattended home sleep test       Dx OSA   Please call me for a report of results and recommendations about 2 weeks after your sleep study.  Feel free to call if we can help.

## 2017-01-18 NOTE — Assessment & Plan Note (Signed)
Tentative diagnosis based on history and physical. Question is whether he has enough sleep apnea to explain his headaches. Appropriate discussion and education done. We reviewed basics of good sleep hygiene, medical concerns of obstructive sleep apnea and potential interventions. Plan-schedule sleep study. Depending on results we may recommend CPAP or an oral appliance

## 2017-01-18 NOTE — Assessment & Plan Note (Signed)
We discussed bedtime, waking time, daytime activity and best sleep environmental measures to improve with sleep quality.

## 2017-01-22 ENCOUNTER — Encounter: Payer: Self-pay | Admitting: Cardiology

## 2017-01-29 LAB — CUP PACEART REMOTE DEVICE CHECK
Battery Voltage: 2.86 V
HIGH POWER IMPEDANCE MEASURED VALUE: 65 Ohm
HIGH POWER IMPEDANCE MEASURED VALUE: 79 Ohm
HighPow Impedance: 304 Ohm
Implantable Lead Implant Date: 20120709
Implantable Lead Location: 753860
Implantable Pulse Generator Implant Date: 20120709
Lead Channel Pacing Threshold Amplitude: 1.375 V
Lead Channel Pacing Threshold Pulse Width: 0.4 ms
Lead Channel Sensing Intrinsic Amplitude: 15.625 mV
Lead Channel Setting Pacing Amplitude: 2.75 V
Lead Channel Setting Pacing Pulse Width: 0.4 ms
MDC IDC MSMT LEADCHNL RV IMPEDANCE VALUE: 399 Ohm
MDC IDC MSMT LEADCHNL RV SENSING INTR AMPL: 15.625 mV
MDC IDC SESS DTM: 20180829230833
MDC IDC SET LEADCHNL RV SENSING SENSITIVITY: 0.3 mV
MDC IDC STAT BRADY RV PERCENT PACED: 0.07 %

## 2017-01-30 ENCOUNTER — Encounter: Payer: Self-pay | Admitting: Family Medicine

## 2017-02-04 ENCOUNTER — Ambulatory Visit (INDEPENDENT_AMBULATORY_CARE_PROVIDER_SITE_OTHER): Payer: Managed Care, Other (non HMO) | Admitting: Neurology

## 2017-02-04 ENCOUNTER — Encounter: Payer: Self-pay | Admitting: Neurology

## 2017-02-04 ENCOUNTER — Other Ambulatory Visit: Payer: Managed Care, Other (non HMO)

## 2017-02-04 ENCOUNTER — Telehealth: Payer: Self-pay | Admitting: Cardiovascular Disease

## 2017-02-04 VITALS — BP 122/84 | HR 71 | Ht 67.0 in | Wt 222.3 lb

## 2017-02-04 DIAGNOSIS — R51 Headache: Secondary | ICD-10-CM | POA: Diagnosis not present

## 2017-02-04 DIAGNOSIS — R519 Headache, unspecified: Secondary | ICD-10-CM

## 2017-02-04 LAB — SEDIMENTATION RATE: Sed Rate: 2 mm/h (ref 0–15)

## 2017-02-04 NOTE — Telephone Encounter (Signed)
New message   Pt c/o Shortness Of Breath: STAT if SOB developed within the last 24 hours or pt is noticeably SOB on the phone  1. Are you currently SOB (can you hear that pt is SOB on the phone)? NO  2. How long have you been experiencing SOB? 2 to 4  weeks  3. Are you SOB when sitting or when up moving around? Moving around  4. Are you currently experiencing any other symptoms? No, just SOB

## 2017-02-04 NOTE — Telephone Encounter (Signed)
Patient complaining of SOB with activity. Patient stated sometimes he has a discomfort in his chest when he has this SOB. Patient stated he is fine just having some test and seeing what Dr. Johnsie Cancel wants to do. Patient is over due for an appointment. Made an appointment for patient to see Richardson Dopp PA on Wednesday to be evaluated. Patient verbalized understanding. Will forward to Dr. Johnsie Cancel for further advisement.

## 2017-02-04 NOTE — Patient Instructions (Addendum)
1.  We will try again for CT of head.   2.  We will also check a sedimentation rate. 3.  Follow up if headaches get worse or pending CT results.

## 2017-02-04 NOTE — Progress Notes (Signed)
NEUROLOGY FOLLOW UP OFFICE NOTE  Roy Dyer [id number]  HISTORY OF PRESENT ILLNESS: Roy Dyer is a 51 year old right-handed male with hypertension, ischemic cardiomyopathy, CAD, status post defibrillator, bilateral hip replacement, mixed hyperlipidemia, lupus with ESRD and renal transplant and history of viral meningitis who follows up for headache.   UPDATE: Headaches have picked up a bit. Intensity:  3-4/10 Duration:  2 hours  Frequency:  Twice a week Frequency of abortive medication: no Current NSAIDS:  no Current analgesics:  Tylenol (not needed) Current triptans:  no Current anti-emetic:  no Current muscle relaxants:  no Current anti-anxiolytic:  no Current sleep aide:  Ambien Current Antihypertensive medications:  Diovan, nebivolol Current Antidepressant medications:  no Current Anticonvulsant medications:  no Current Vitamins/Herbal/Supplements:  magnesium oxide 400mg  Current Antihistamines/Decongestants:  no Other therapy:  no Other medication:  ASA 81mg , Plavix, Prograf  He was referred to sleep medicine for evaluation of OSA.  A sleep study was ordered but denied by his insurance.   Caffeine:  Coffee daily Alcohol:  no Smoker:  no Diet:  Drinks two 16 oz bottle of water Exercise:  no Depression/anxiety:  Works as a Health visitor.  Work intensity is cyclic and stress reduced recently Sleep hygiene:  Poor.  Wakes up often   HISTORY:  Onset:  Started having headaches two years ago, which gradually increased in frequency over the past 6 months.  Initially, he developed them at the end of the day while at work but recently he would wake up with them too. Location:  right sided (retro-orbital and radiates to right ear) Quality:  Nonthrobbing, ache Initial Intensity:  Usually 3/10; May: 3/10 Aura:  no Prodrome:  no Associated symptoms:  No nausea, vomiting, photophobia, phonophobia, visual disturbance.  Not a thunderclap headache or wakes up from sleep.  Denies  fevers, confusion. Initial Duration:  2 hours; May: 2 hours Initial Frequency:  5 to 10 days a month; May: once a week Frequency of abortive medication: 5 to 10 days a month. Triggers/exacerbating factors:  Stress.   Relieving factors:  Rubbing his neck but denies neck pain. Activity:  Usually able to function   Past NSAIDS:  no Past analgesics:  no Past abortive triptans:  Maxalt (palpitations) Past muscle relaxants:  no Past anti-emetic:  no Past antihypertensive medications:  furosemide, losartan, Imdur, metoprolol Past antidepressant medications:  no Past anticonvulsant medications:  gabapentin 600mg  twice daily (ineffective, caused hyperkalemia) Past vitamins/Herbal/Supplements:  no Other past therapies:  no   Family history of headache:  no  PAST MEDICAL HISTORY: Past Medical History:  Diagnosis Date  . AICD (automatic cardioverter/defibrillator) present    Dr. Paschal Dopp follows remotely-yrly checks, Dr. Jonne Ply  . Avascular necrosis of bone of hip ([place]) 2010 surg   Left hip arthroplasty: from chronic systemic steroids taken for his Lupus  . Barrett's esophagus   . Blood transfusion without reported diagnosis 2010, 2012  . CAD (coronary artery disease)     stents RCA/Circ 2001, BMS to LAD 07/2010  . Cardiomyopathy    (Ischemic):  Chronic systolic dysfunction--  47/0962 EF 30-35%.   Single chamber ICD 11/20/10 ([name])  . Cataract    left eye  . CHF (congestive heart failure) ([place])   . Chronic headaches    Dr. [name] 06/2016: suspects tension HA's.  CT brain was considered (MRI was not approved by insurer) but ended up not being needed as of 09/2016.  His neurontin was increased to 600 mg bid but this  caused hyperkalemia so he d/c'd this med.  HA's improved with decre caffeine and incre water.   Also got referred for eval for possible OSA.  Renal MD suspicous that his polycyt may be cause HAs.  . Chronic renal insufficiency, stage II (mild)    GFR 65 ml/min 07/19/15 at  local renal f/u (Cr 1.29).  GFR 64 ml/min (cr 1.3) at local renal f/u 01/16/17.  . End stage renal disease ([place]) 04/24/2011   Secondary to SLE: HD 06/2011-10/2012 (then got renal transplant)  . GERD (gastroesophageal reflux disease)    Hx of esoph stricture and dilatation.  +Barrett's esophagus+  . Gout    s/p renal transplant he was weened off of his allopurinol.  . Hiatal hernia   . History of renal transplant 11/10/2012   10/2012 [facility]   . Hyperkalemia 07/2016  . Hyperlipidemia   . Hypertension   . implantable cardiac defibrillator single chamber    [employer] (due to low EF)  . Left ventricular thrombus 2012   Re-eval 02/2011 showed thrombus RESOLVED, so coumadin was d/c'd (was on it for 64mo)  . Lupus   . Myocardial infarction [facility]) 2001 and 2012   3 stents and defib placed in July  . OSA (obstructive sleep apnea) 2018   Eval by Dr. Annamaria Boots 01/2017-- plan for unattended home sleep test.  . Osteoporosis   . Post-transplant erythrocytosis    improved with ARB (valsartan)    MEDICATIONS: Current Outpatient Prescriptions on File Prior to Visit  Medication Sig Dispense Refill  . acetaminophen (TYLENOL) 500 MG tablet Take 1,000 mg by mouth every 6 (six) hours as needed for headache (pain).     [name] allopurinol (ZYLOPRIM) 100 MG tablet Take 1 tablet (100 mg total) by mouth daily. 90 tablet 3  . aspirin EC 81 MG tablet Take 81 mg by mouth daily.    [name] atorvastatin (LIPITOR) 80 MG tablet TAKE 1 TABLET BY MOUTH DAILY 90 tablet 3  . BYSTOLIC 5 MG tablet TAKE 1 TABLET BY MOUTH DAILY 90 tablet 3  . Cholecalciferol (VITAMIN D-3) 5000 units TABS Take 1 tablet by mouth 4 (four) times a week. Sunday, Monday, Wednesday, Friday morning    . clopidogrel (PLAVIX) 75 MG tablet TAKE 1 TABLET BY MOUTH DAILY 90 tablet 3  . DEXILANT 60 MG capsule TAKE 1 CAPSULE BY MOUTH DAILY BEFORE BREAKFAST 90 capsule 0  . losartan (COZAAR) 25 MG tablet Take 1 tablet by mouth daily.    . mycophenolate (CELLCEPT) 250 MG capsule  Take 3 capsules (750 mg total) by mouth 2 (two) times daily. 540 capsule 1  . niacin (NIASPAN) 500 MG CR tablet TAKE 2 TABLETS BY MOUTH AT BEDTIME 180 tablet 3  . NITROSTAT 0.4 MG SL tablet USE 1 TAB UNDER TONGUE EVERY 5 MINUTES UP TO 3 DOSES AS NEEDED 25 tablet 2  . predniSONE (DELTASONE) 5 MG tablet TAKE 1 TABLET BY MOUTH DAILY 90 tablet 3  . ranitidine (ZANTAC) 150 MG tablet TAKE 1 TABLET BY MOUTH AT BEDTIME 90 tablet 0  . tacrolimus (PROGRAF) 0.5 MG capsule Take 0.5-1 mg by mouth See admin instructions. Take 1 capsule (0.5 mg) by mouth daily at bedtime; take 2 capsules (1 mg) Sunday, Monday, Wednesday, Friday mornings, take 1 capsule (0.5 mg) Tuesday, Thursday, Saturday mornings    . zolpidem (AMBIEN) 5 MG tablet Take 5 mg by mouth at bedtime as needed for sleep.    . [DISCONTINUED] calcium carbonate, dosed in mg elemental calcium, 1250 MG/5ML  Take 5 mLs (500 mg of elemental calcium total) by mouth every 6 (six) hours as needed. 450 mL 2   No current facility-administered medications on file prior to visit.     ALLERGIES: Allergies  Allergen Reactions  . Triptans Palpitations    Reaction to Maxalt    FAMILY HISTORY: Family History  Problem Relation Age of Onset  . Kidney failure Mother   . Lupus Mother   . Stroke Mother   . Diabetes Mother        type 2  . Heart attack Father        X 7  . Hypertension Father   . Hyperlipidemia Father   . Heart attack Sister 69       X 1  . Hyperlipidemia Sister   . Hypertension Sister   . Heart disease Sister   . Heart attack Paternal Grandfather   . Heart disease Paternal Aunt   . Heart disease Paternal Uncle   . Colon cancer Neg Hx   . Esophageal cancer Neg Hx   . Stomach cancer Neg Hx   . Rectal cancer Neg Hx     SOCIAL HISTORY: Social History   Social History  . Marital status: Married    Spouse name: N/A  . Number of children: N/A  . Years of education: N/A   Occupational History  . CAD Drafter  [employer]   Social  History Main Topics  . Smoking status: Former Smoker    Packs/day: 0.50    Years: 30.00    Types: Cigarettes    Quit date: 08/02/2010  . Smokeless tobacco: Never Used  . Alcohol use No  . Drug use: No  . Sexual activity: Yes    Partners: Female   Other Topics Concern  . Not on file   Social History Narrative   Married, 1 teenage son and 1 teenage daughter.    Occupation: Printmaker.   15 pack-yr smoking hx, quit 07/2010.   Drug Use - no   No alcohol.             REVIEW OF SYSTEMS: Constitutional: No fevers, chills, or sweats, no generalized fatigue, change in appetite Eyes: No visual changes, double vision, eye pain Ear, nose and throat: No hearing loss, ear pain, nasal congestion, sore throat Cardiovascular: No chest pain, palpitations Respiratory:  No shortness of breath at rest or with exertion, wheezes GastrointestinaI: No nausea, vomiting, diarrhea, abdominal pain, fecal incontinence Genitourinary:  No dysuria, urinary retention or frequency Musculoskeletal:  No neck pain, back pain Integumentary: No rash, pruritus, skin lesions Neurological: as above Psychiatric: No depression, insomnia, anxiety Endocrine: No palpitations, fatigue, diaphoresis, mood swings, change in appetite, change in weight, increased thirst Hematologic/Lymphatic:  No purpura, petechiae. Allergic/Immunologic: no itchy/runny eyes, nasal congestion, recent allergic reactions, rashes  PHYSICAL EXAM: Vitals:   02/04/17 0852  BP: 122/84  Pulse: 71  SpO2: 98%   General: No acute distress.  Patient appears well-groomed.  normal body habitus. Head:  Normocephalic/atraumatic Eyes:  Fundi examined but not visualized Neck: supple, no paraspinal tenderness, full range of motion Heart:  Regular rate and rhythm Lungs:  Clear to auscultation bilaterally Back: No paraspinal tenderness Neurological Exam: alert and oriented to person, place, and time. Attention span and concentration intact,  recent and remote memory intact, fund of knowledge intact.  Speech fluent and not dysarthric, language intact.  CN II-XII intact. Bulk and tone normal, muscle strength 5/5 throughout.  Sensation to light touch, temperature and vibration  intact.  Deep tendon reflexes 2+ throughout, toes downgoing.  Finger to nose and heel to shin testing intact.  Gait normal, Romberg negative.  IMPRESSION: Right sided headache.  Treatment options are limited, such as TCAs (cardiac history) or topiramate (renal history).  I could discuss with his cardiologist and nephrologist, but he would rather not take any prescription medication that may be problematic.  PLAN: 1.  Headache has become more frequent and I believe do warrant additional workup, so we will check CT of head without contrast. 2.  We will also check a Sed Rate 3.  Follow up as needed or pending test results.  15 minutes spent face to face with patient, over 50% spent discussing diagnosis and management.  Metta Clines, DO  CC:  [name], MD

## 2017-02-06 ENCOUNTER — Encounter: Payer: Self-pay | Admitting: Physician Assistant

## 2017-02-06 ENCOUNTER — Ambulatory Visit (INDEPENDENT_AMBULATORY_CARE_PROVIDER_SITE_OTHER): Payer: Managed Care, Other (non HMO) | Admitting: Cardiology

## 2017-02-06 VITALS — BP 136/60 | HR 67 | Ht 67.0 in | Wt 222.8 lb

## 2017-02-06 DIAGNOSIS — I200 Unstable angina: Secondary | ICD-10-CM

## 2017-02-06 DIAGNOSIS — I2583 Coronary atherosclerosis due to lipid rich plaque: Secondary | ICD-10-CM | POA: Diagnosis not present

## 2017-02-06 DIAGNOSIS — Z940 Kidney transplant status: Secondary | ICD-10-CM | POA: Diagnosis not present

## 2017-02-06 DIAGNOSIS — E782 Mixed hyperlipidemia: Secondary | ICD-10-CM | POA: Diagnosis not present

## 2017-02-06 DIAGNOSIS — I255 Ischemic cardiomyopathy: Secondary | ICD-10-CM

## 2017-02-06 DIAGNOSIS — I2510 Atherosclerotic heart disease of native coronary artery without angina pectoris: Secondary | ICD-10-CM

## 2017-02-06 LAB — PROTIME-INR
INR: 1.1 (ref 0.8–1.2)
Prothrombin Time: 11.3 s (ref 9.1–12.0)

## 2017-02-06 LAB — BASIC METABOLIC PANEL
BUN / CREAT RATIO: 15 (ref 9–20)
BUN: 15 mg/dL (ref 6–24)
CALCIUM: 9.2 mg/dL (ref 8.7–10.2)
CHLORIDE: 101 mmol/L (ref 96–106)
CO2: 25 mmol/L (ref 20–29)
Creatinine, Ser: 1.02 mg/dL (ref 0.76–1.27)
GFR calc non Af Amer: 86 mL/min/{1.73_m2} (ref >59)
GFR, EST AFRICAN AMERICAN: 99 mL/min/{1.73_m2} (ref >59)
Glucose: 114 mg/dL — ABNORMAL HIGH (ref 65–99)
POTASSIUM: 4.4 mmol/L (ref 3.5–5.2)
SODIUM: 143 mmol/L (ref 134–144)

## 2017-02-06 LAB — CBC
HEMOGLOBIN: 16.7 g/dL (ref 13.0–17.7)
Hematocrit: 48.4 % (ref 37.5–51.0)
MCH: 30.9 pg (ref 26.6–33.0)
MCHC: 34.5 g/dL (ref 31.5–35.7)
MCV: 90 fL (ref 79–97)
Platelets: 190 10*3/uL (ref 150–379)
RBC: 5.41 x10E6/uL (ref 4.14–5.80)
RDW: 13.4 % (ref 12.3–15.4)
WBC: 10.9 10*3/uL — ABNORMAL HIGH (ref 3.4–10.8)

## 2017-02-06 MED ORDER — NITROGLYCERIN 0.4 MG SL SUBL: SUBLINGUAL_TABLET | SUBLINGUAL | 3 refills | Status: DC

## 2017-02-06 MED ORDER — ISOSORBIDE MONONITRATE ER 30 MG PO TB24: 30.0000 mg | ORAL_TABLET | Freq: Every day | ORAL | 3 refills | Status: DC

## 2017-02-06 NOTE — Progress Notes (Signed)
Cardiology Office Note:    Date:  02/06/2017   ID:  Roy Dyer, DOB Mar 06, 1966, MRN [id number]  PCP:  [name], MD  Cardiologist:  Schuyler Amor  Referring MD: [name], MD  Electrophysiologist: [name]   Chief Complaint  Patient presents with  . Shortness of Breath    and chest tightness    History of Present Illness:    Roy Dyer is a 51 y.o. male with a past medical history significant for Lupus, CAD status post MI 2001 in 2012 with stents, ICD due to low EF, hypertension, hyperlipidemia, OSA, GERD, CKD and renal transplant 2014, CHF.  Roy Dyer has a history of CAD who suffered anterior MI in March 2012. He underwent bare metal stenting to the LAD at that time. He has previous stents to the circumflex and RCA in 2001. After his 2012 MI the patient had a low EF which remain <35% at 90 days post revascularization and thus an ICD was placed by [name] 11/20/10. He had a large mural apical thrombus at the time and was anticoagulated and subsequently the thrombus resolved and the Coumadin was stopped.  The patient had a hip replacement in 07/2010 and had worse renal function and ended up with renal transplant at [facility]. He sees every 6 months, last was in 10/2016, and Dr. Moshe Cipro every 4 months. His niece was the donor.  The last ICD download was on 01/29/17 and showed normal functioning. Most recent echocardiogram on 04/23/2016 showed EF 30%  Today the patient is here alone. He has complaints of a 2-4 weeks history of shortness of breath and upper chest tightness/burning that goes up into his throat. This occurs with exertion like climbing one flight of stairs and walking in from the car to the office or his house to the car. It resolves with rest after 3-5 minutes. No chest pain at rest and does not wake him at night. He has not taken NTG. This occurs 2-3 times per day. This is a definite new complaint per the patient. He has no orthopnea, PND, edema, palpitations,  nausea, diaphoresis or lightheadedness. He does not feel that he could make it through a stress test.  Recent labs at nephrology office on 01/16/17 showed SCr 1.3, Mag. 1.5, Hgb 17.5 (pt had therapeutic phlebotomy after).   Past Medical History:  Diagnosis Date  . AICD (automatic cardioverter/defibrillator) present    Dr. Paschal Dopp follows remotely-yrly checks, Dr. Jonne Ply  . Avascular necrosis of bone of hip ([place]) 2010 surg   Left hip arthroplasty: from chronic systemic steroids taken for his Lupus  . Barrett's esophagus   . Blood transfusion without reported diagnosis 2010, 2012  . CAD (coronary artery disease)     stents RCA/Circ 2001, BMS to LAD 07/2010  . Cardiomyopathy    (Ischemic):  Chronic systolic dysfunction--  38/1829 EF 30-35%.   Single chamber ICD 11/20/10 ([name])  . Cataract    left eye  . CHF (congestive heart failure) ([place])   . Chronic headaches    Dr. [name] 06/2016: suspects tension HA's.  CT brain was considered (MRI was not approved by insurer) but ended up not being needed as of 09/2016.  His neurontin was increased to 600 mg bid but this caused hyperkalemia so he d/c'd this med.  HA's improved with decre caffeine and incre water.   Also got referred for eval for possible OSA.  Renal MD suspicous that his polycyt may be cause HAs.  [name]  Chronic renal insufficiency, stage II (mild)    GFR 65 ml/min 07/19/15 at local renal f/u (Cr 1.29).  GFR 64 ml/min (cr 1.3) at local renal f/u 01/16/17.  . End stage renal disease ([place]) 04/24/2011   Secondary to SLE: HD 06/2011-10/2012 (then got renal transplant)  . GERD (gastroesophageal reflux disease)    Hx of esoph stricture and dilatation.  +Barrett's esophagus+  . Gout    s/p renal transplant he was weened off of his allopurinol.  . Hiatal hernia   . History of renal transplant 11/10/2012   10/2012 [facility]   . Hyperkalemia 07/2016  . Hyperlipidemia   . Hypertension   . implantable cardiac defibrillator single chamber    Medtronic  (due to low EF)  . Left ventricular thrombus 2012   Re-eval 02/2011 showed thrombus RESOLVED, so coumadin was d/c'd (was on it for 61mo)  . Lupus   . Myocardial infarction [facility]) 2001 and 2012   3 stents and defib placed in July  . OSA (obstructive sleep apnea) 2018   Eval by Dr. Annamaria Boots 01/2017-- plan for unattended home sleep test.  . Osteoporosis   . Post-transplant erythrocytosis    improved with ARB (valsartan)    Past Surgical History:  Procedure Laterality Date  . AV FISTULA PLACEMENT  03/28/2011   Procedure: ARTERIOVENOUS (AV) FISTULA CREATION;  Surgeon: Rosetta Posner, MD;  Location: [place];  Service: Vascular;  Laterality: Left;  Creation of Left radiocephallic cimino fistula  . AV FISTULA PLACEMENT  04/27/2011   Procedure: ARTERIOVENOUS (AV) FISTULA CREATION;  Surgeon: Rosetta Posner, MD;  Location: [place];  Service: Vascular;  Laterality: Left;  left basilic vein transposition  . CARDIAC CATHETERIZATION     08/2010  . CARDIOVASCULAR STRESS TEST  07/2010; 03/2014   03/2014 showed large old infarct and EF 30-35% but no ischemia  . ESOPHAGOGASTRODUODENOSCOPY  02/2009   Done by Dr. Fuller Plan for hematemesis; esophagitis found.  Repeat recommended 09/2016, at which time he will also likely get his initial screening colonoscopy.  . ESOPHAGOGASTRODUODENOSCOPY (EGD) WITH PROPOFOL N/A 09/29/2015   REPEAT EGD RECOMMENDED 09/2016.  Barrett's esoph + mild chronic gastritis.  H pylori NEG. Procedure: ESOPHAGOGASTRODUODENOSCOPY (EGD) WITH PROPOFOL;  Surgeon: Jerene Bears, MD;  Location: [facility];  Service: Gastroenterology;  Laterality: N/A;  . INSERT / REPLACE / REMOVE PACEMAKER     medtronic        dr [name]    [place]   icd only  . KIDNEY TRANSPLANT  10/2012   [facility] nephrologist--Dr. Blair Heys  . PARTIAL HIP ARTHROPLASTY Left 2010   left  . RENAL BIOPSY    . stents     05-2010 and 2- in 2010  . TOTAL HIP ARTHROPLASTY  07/09/2011   Procedure: TOTAL HIP ARTHROPLASTY;  Surgeon: Kerin Salen,  MD;  Location: [place];  Service: Orthopedics;  Laterality: Right;  . TYMPANOSTOMY TUBE PLACEMENT  51 yrs old  . US ECHOCARDIOGRAPHY  12/2010; 02/2014;08/2014   02/2014 EF still 25-30%, increased PA pressures.  2016 EF 30-35%, sept/inf hypokinesis, mod tricusp regurg    Current Medications: Current Meds  Medication Sig  . acetaminophen (TYLENOL) 500 MG tablet Take 1,000 mg by mouth every 6 (six) hours as needed for headache (pain).   [name] allopurinol (ZYLOPRIM) 100 MG tablet Take 1 tablet (100 mg total) by mouth daily.  [name] aspirin EC 81 MG tablet Take 81 mg by mouth daily.  [name] atorvastatin (LIPITOR) 80 MG tablet TAKE 1  TABLET BY MOUTH DAILY  . BYSTOLIC 5 MG tablet TAKE 1 TABLET BY MOUTH DAILY  . Cholecalciferol (VITAMIN D-3) 5000 units TABS Take 1 tablet by mouth 4 (four) times a week. Sunday, Monday, Wednesday, Friday morning  . clopidogrel (PLAVIX) 75 MG tablet TAKE 1 TABLET BY MOUTH DAILY  . DEXILANT 60 MG capsule TAKE 1 CAPSULE BY MOUTH DAILY BEFORE BREAKFAST  . losartan (COZAAR) 25 MG tablet Take 1 tablet by mouth daily.  . mycophenolate (CELLCEPT) 250 MG capsule Take 3 capsules (750 mg total) by mouth 2 (two) times daily.  . niacin (NIASPAN) 500 MG CR tablet TAKE 2 TABLETS BY MOUTH AT BEDTIME  . nitroGLYCERIN (NITROSTAT) 0.4 MG SL tablet USE 1 TAB UNDER TONGUE EVERY 5 MINUTES UP TO 3 DOSES AS NEEDED  . predniSONE (DELTASONE) 5 MG tablet TAKE 1 TABLET BY MOUTH DAILY  . ranitidine (ZANTAC) 150 MG tablet TAKE 1 TABLET BY MOUTH AT BEDTIME  . tacrolimus (PROGRAF) 0.5 MG capsule Take 0.5-1 mg by mouth See admin instructions. Take 1 capsule (0.5 mg) by mouth daily at bedtime; take 2 capsules (1 mg) Sunday, Monday, Wednesday, Friday mornings, take 1 capsule (0.5 mg) Tuesday, Thursday, Saturday mornings  . zolpidem (AMBIEN) 5 MG tablet Take 5 mg by mouth at bedtime as needed for sleep.  . [DISCONTINUED] NITROSTAT 0.4 MG SL tablet USE 1 TAB UNDER TONGUE EVERY 5 MINUTES UP TO 3 DOSES AS NEEDED      Allergies:   Triptans   Social History   Social History  . Marital status: Married    Spouse name: N/A  . Number of children: N/A  . Years of education: N/A   Occupational History  . CAD Drafter  [employer]   Social History Main Topics  . Smoking status: Former Smoker    Packs/day: 0.50    Years: 30.00    Types: Cigarettes    Quit date: 08/02/2010  . Smokeless tobacco: Never Used  . Alcohol use No  . Drug use: No  . Sexual activity: Yes    Partners: Female   Other Topics Concern  . None   Social History Narrative   Married, 1 teenage son and 1 teenage daughter.    Occupation: Printmaker.   15 pack-yr smoking hx, quit 07/2010.   Drug Use - no   No alcohol.              Family History: The patient's family history includes Diabetes in his mother; Heart attack in his father and paternal grandfather; Heart attack (age of onset: 16) in his sister; Heart disease in his paternal aunt, paternal uncle, and sister; Hyperlipidemia in his father and sister; Hypertension in his father and sister; Kidney failure in his mother; Lupus in his mother; Stroke in his mother. There is no history of Colon cancer, Esophageal cancer, Stomach cancer, or Rectal cancer. ROS:   Please see the history of present illness.     All other systems reviewed and are negative.  EKGs/Labs/Other Studies Reviewed:    The following studies were reviewed today:  Echocardiogram 04/23/2016 Study Conclusions  - Left ventricle: The cavity size was moderately dilated. Wall   thickness was increased in a pattern of mild LVH. The estimated   ejection fraction was 30%. Left ventricular diastolic function   parameters were normal. - Left atrium: The atrium was severely dilated. - Atrial septum: No defect or patent foramen ovale was identified. - Pulmonary arteries: PA peak pressure: 55 mm Hg (S).  EKG:  EKG is  ordered today.  The ekg ordered today demonstrates sinus rhythm with 1st degree AVB, PRI  0.210, LAD, no acute ischemic changes  Recent Labs: 07/15/2016: B Natriuretic Peptide 379.2 07/17/2016: ALT 15; Magnesium 1.9 08/01/2016: Hemoglobin 16.4; Platelets 223.0 08/28/2016: BUN 19; Creatinine, Ser 1.33; Potassium 4.7; Sodium 142   Recent Lipid Panel    Component Value Date/Time   CHOL 127 03/22/2015 0803   TRIG 70.0 03/22/2015 0803   HDL 60.50 03/22/2015 0803   CHOLHDL 2 03/22/2015 0803   VLDL 14.0 03/22/2015 0803   LDLCALC 53 03/22/2015 0803    Physical Exam:    VS:  BP 136/60   Pulse 67   Ht 5\' 7"  (1.702 m)   Wt 222 lb 12.8 oz (101.1 kg)   SpO2 98%   BMI 34.90 kg/m     Wt Readings from Last 3 Encounters:  02/06/17 222 lb 12.8 oz (101.1 kg)  02/04/17 222 lb 4.8 oz (100.8 kg)  01/18/17 221 lb 6.4 oz (100.4 kg)     Physical Exam  Constitutional: He is oriented to person, place, and time. He appears well-developed and well-nourished. No distress.  HENT:  Head: Normocephalic and atraumatic.  Neck: Normal range of motion. Neck supple. No JVD present.  Cardiovascular: Normal rate.  Exam reveals no gallop and no friction rub.   No murmur heard. Pulmonary/Chest: Effort normal and breath sounds normal. No respiratory distress. He has no wheezes. He has no rales. He exhibits no tenderness.  Abdominal: Soft. Bowel sounds are normal. There is no tenderness.  Musculoskeletal: Normal range of motion. He exhibits no edema.  Neurological: He is alert and oriented to person, place, and time.  Skin: Skin is warm and dry.  Psychiatric: He has a normal mood and affect. His behavior is normal. Thought content normal.     ASSESSMENT:    1. Unstable angina pectoris ([place])   2. ischemic cardiomyopathy status post anterolateral MI   3. Coronary artery disease due to lipid rich plaque   4. History of renal transplant   5. Mixed hyperlipidemia    PLAN:    In order of problems listed above:  Chest pain: Pt has exertional upper chest tightness to the throat with associated  shortness of breath with minimal exertion and relieved with rest. Reviewed case with Dr. Johnsie Cancel. With these typical symptoms concerning for unstable angina and hx of anterior MI, pt needs cardiac cath for further definite cardiac evaluation. I spoke with Dr. Moshe Cipro, patient's nephrologist,  regarding renal protection. She advises that pt has been very stable with his post renal transplant and that evidence states that with good renal function their is no evidence that extra precautions should be taken. She advises some gentle pre-hydration and no need for bicarb drip to be administered and hold ARB surrounding cath.        -LHC scheduled for Friday with Dr. Burt Knack       -Gentle precath hydration with NS at 50 ml/hr for 4 hours       -Pre-cath labs today       -add Imdur 30 mg daily, SL NTG as needed.       -Pt instructed to hold losartan night before cath (takes it at night)       -Pt instructed to go to ED if has chest discomfort/shortness of breath that dose not resolve with rest and/or NTG.  The patient understands that risks included but are not limited to stroke (1 in  1000), death (1 in 48), kidney failure [usually temporary] (1 in 500), bleeding (1 in 200), allergic reaction [possibly serious] (1 in 200). He is willing to proceed. He thinks he has had about 5 caths before.   Ischemic cardomyopathy:  EF 30% by last echo in 04/2016. Has ICD  CAD: History of MI 2 and several stents, 2001 & 2012. Patient is on aspirin 81 mg,  Plavix, statin, beta blocker, ARB  AICD: Normal device function per last down load 01/29/17  CKD:  History of renal transplant, on antirejection medications.  Followed at [facility]. Last serum creatinine was 1.4 in 10/2016. Magnesium was low at that time 1.5. Per [facility] notes baseline SCr is 1.3-1.4  Hyperlipidemia: Lipid panel from 07/10/16 shows LDL 47 which is at goal <70. Continue atorvastatin 80 mg   Medication Adjustments/Labs and Tests Ordered: Current medicines  are reviewed at length with the patient today.  Concerns regarding medicines are outlined above. Labs and tests ordered and medication changes are outlined in the patient instructions below:  Patient Instructions  Medication Instructions:  1. START IMDUR 30 MG DAILY; RX HAS BEEN SENT IN  Labwork: 1. TODAY BMET, CBC, PT/INR; PRE CATH LABS  Testing/Procedures: Your physician has requested that you have a cardiac catheterization. Cardiac catheterization is used to diagnose and/or treat various heart conditions. Doctors may recommend this procedure for a number of different reasons. The most common reason is to evaluate chest pain. Chest pain can be a symptom of coronary artery disease (CAD), and cardiac catheterization can show whether plaque is narrowing or blocking your heart's arteries. This procedure is also used to evaluate the valves, as well as measure the blood flow and oxygen levels in different parts of your heart. For further information please visit HugeFiesta.tn. Please follow instruction sheet, as given.    Follow-Up: YOU WILL NEED TO BE SCHEDULED FOR A POST CATH FOLLOW WITH DR. Johnsie Cancel OR ONE HIS CARE TEAM MEMBERS  Any Other Special Instructions Will Be Listed Below (If Applicable). If you need a refill on your cardiac medications before your next appointment, please call your pharmacy.    [place] OFFICE [address] [place] [address] Dept: [phone] Loc: [place]  02/06/2017  You are scheduled for a Cardiac Catheterization on Friday, September 28 with Dr. Sherren Mocha.  1. Please arrive at the [facility] (Main Entrance A) at [facility]: [address] [place], [place] [address] at 9:30 AM (two hours before your procedure to ensure your preparation). Free valet parking service is available.   Special note: Every effort is made to have  your procedure done on time. Please understand that emergencies sometimes delay scheduled procedures.  2. Diet: Do not eat or drink anything after midnight prior to your procedure except sips of water to take medications.  3. Labs: You will need to have blood drawn on Wednesday, September 26 at [facility] at [facility] [address]  Open: 7:30am - 5pm    Phone: [phone]. You do not need to be fasting.  4. Medication instructions in preparation for your procedure: On the morning of your procedure, take your Aspirin and  PLAVIX any morning medicines NOT listed above.  You may use SMALL sips of water.  5. Plan for one night stay--bring personal belongings. 6. Bring a current list of your medications and current insurance cards. 7. You  MUST have a responsible person to drive you home. 8. Someone MUST be with you the first 24 hours after you arrive home or your discharge will be delayed. 9. Please wear clothes that are easy to get on and off and wear slip-on shoes.  Thank you for allowing [place] to care for you!   -- [facility] Invasive Cardiovascular services     Signed, Daune Perch, NP  02/06/2017 11:36 AM    [place]

## 2017-02-06 NOTE — Patient Instructions (Signed)
Medication Instructions:  1. START IMDUR 30 MG DAILY; RX HAS BEEN SENT IN  Labwork: 1. TODAY BMET, CBC, PT/INR; PRE CATH LABS  Testing/Procedures: Your physician has requested that you have a cardiac catheterization. Cardiac catheterization is used to diagnose and/or treat various heart conditions. Doctors may recommend this procedure for a number of different reasons. The most common reason is to evaluate chest pain. Chest pain can be a symptom of coronary artery disease (CAD), and cardiac catheterization can show whether plaque is narrowing or blocking your heart's arteries. This procedure is also used to evaluate the valves, as well as measure the blood flow and oxygen levels in different parts of your heart. For further information please visit HugeFiesta.tn. Please follow instruction sheet, as given.    Follow-Up: YOU WILL NEED TO BE SCHEDULED FOR A POST CATH FOLLOW WITH DR. Johnsie Cancel OR ONE HIS CARE TEAM MEMBERS  Any Other Special Instructions Will Be Listed Below (If Applicable). If you need a refill on your cardiac medications before your next appointment, please call your pharmacy.    [place] OFFICE [address] [place] [address] Dept: [phone] Loc: [place]  02/06/2017  You are scheduled for a Cardiac Catheterization on Friday, September 28 with Dr. Sherren Mocha.  1. Please arrive at the [facility] (Main Entrance A) at [facility]: [address] [place], [place] [address] at 9:30 AM (two hours before your procedure to ensure your preparation). Free valet parking service is available.   Special note: Every effort is made to have your procedure done on time. Please understand that emergencies sometimes delay scheduled procedures.  2. Diet: Do not eat or drink anything after midnight prior to your procedure except sips of water to take  medications.  3. Labs: You will need to have blood drawn on Wednesday, September 26 at [facility] at [facility]. [address]  Open: 7:30am - 5pm    Phone: [phone]. You do not need to be fasting.  4. Medication instructions in preparation for your procedure: On the morning of your procedure, take your Aspirin and  PLAVIX any morning medicines NOT listed above.  You may use SMALL sips of water.  5. Plan for one night stay--bring personal belongings. 6. Bring a current list of your medications and current insurance cards. 7. You MUST have a responsible person to drive you home. 8. Someone MUST be with you the first 24 hours after you arrive home or your discharge will be delayed. 9. Please wear clothes that are easy to get on and off and wear slip-on shoes.  Thank you for allowing [place] to care for you!   -- [place] In[facility] Cardi[facility] services

## 2017-02-08 ENCOUNTER — Encounter (HOSPITAL_COMMUNITY): Payer: Self-pay | Admitting: Cardiovascular Disease

## 2017-02-08 ENCOUNTER — Ambulatory Visit (HOSPITAL_COMMUNITY)
Admission: RE | Admit: 2017-02-08 | Discharge: 2017-02-09 | Disposition: A | Discharge status: Home / Self Care | Payer: Managed Care, Other (non HMO) | Source: Ambulatory Visit | Attending: Cardiovascular Disease | Admitting: Cardiovascular Disease

## 2017-02-08 ENCOUNTER — Ambulatory Visit (HOSPITAL_COMMUNITY)
Admission: RE | Disposition: A | Discharge status: Home / Self Care | Payer: Self-pay | Source: Ambulatory Visit | Attending: Cardiovascular Disease

## 2017-02-08 ENCOUNTER — Telehealth: Payer: Self-pay

## 2017-02-08 DIAGNOSIS — M329 Systemic lupus erythematosus, unspecified: Secondary | ICD-10-CM | POA: Insufficient documentation

## 2017-02-08 DIAGNOSIS — I2584 Coronary atherosclerosis due to calcified coronary lesion: Secondary | ICD-10-CM | POA: Diagnosis not present

## 2017-02-08 DIAGNOSIS — T82855A Stenosis of coronary artery stent, initial encounter: Secondary | ICD-10-CM | POA: Insufficient documentation

## 2017-02-08 DIAGNOSIS — Z7902 Long term (current) use of antithrombotics/antiplatelets: Secondary | ICD-10-CM | POA: Diagnosis not present

## 2017-02-08 DIAGNOSIS — M109 Gout, unspecified: Secondary | ICD-10-CM | POA: Diagnosis not present

## 2017-02-08 DIAGNOSIS — I252 Old myocardial infarction: Secondary | ICD-10-CM | POA: Diagnosis not present

## 2017-02-08 DIAGNOSIS — K2100 Gastro-esophageal reflux disease with esophagitis, without bleeding: Secondary | ICD-10-CM | POA: Diagnosis present

## 2017-02-08 DIAGNOSIS — N186 End stage renal disease: Secondary | ICD-10-CM | POA: Diagnosis present

## 2017-02-08 DIAGNOSIS — I130 Hypertensive heart and chronic kidney disease with heart failure and stage 1 through stage 4 chronic kidney disease, or unspecified chronic kidney disease: Secondary | ICD-10-CM | POA: Diagnosis not present

## 2017-02-08 DIAGNOSIS — E782 Mixed hyperlipidemia: Secondary | ICD-10-CM | POA: Diagnosis not present

## 2017-02-08 DIAGNOSIS — Z23 Encounter for immunization: Secondary | ICD-10-CM | POA: Insufficient documentation

## 2017-02-08 DIAGNOSIS — Z95810 Presence of automatic (implantable) cardiac defibrillator: Secondary | ICD-10-CM | POA: Diagnosis not present

## 2017-02-08 DIAGNOSIS — I255 Ischemic cardiomyopathy: Secondary | ICD-10-CM | POA: Diagnosis present

## 2017-02-08 DIAGNOSIS — K219 Gastro-esophageal reflux disease without esophagitis: Secondary | ICD-10-CM | POA: Insufficient documentation

## 2017-02-08 DIAGNOSIS — Z955 Presence of coronary angioplasty implant and graft: Secondary | ICD-10-CM

## 2017-02-08 DIAGNOSIS — I10 Essential (primary) hypertension: Secondary | ICD-10-CM | POA: Diagnosis present

## 2017-02-08 DIAGNOSIS — Z940 Kidney transplant status: Secondary | ICD-10-CM | POA: Diagnosis not present

## 2017-02-08 DIAGNOSIS — Z7952 Long term (current) use of systemic steroids: Secondary | ICD-10-CM | POA: Diagnosis not present

## 2017-02-08 DIAGNOSIS — I200 Unstable angina: Secondary | ICD-10-CM

## 2017-02-08 DIAGNOSIS — G4733 Obstructive sleep apnea (adult) (pediatric): Secondary | ICD-10-CM | POA: Diagnosis not present

## 2017-02-08 DIAGNOSIS — I5022 Chronic systolic (congestive) heart failure: Secondary | ICD-10-CM | POA: Insufficient documentation

## 2017-02-08 DIAGNOSIS — Y831 Surgical operation with implant of artificial internal device as the cause of abnormal reaction of the patient, or of later complication, without mention of misadventure at the time of the procedure: Secondary | ICD-10-CM | POA: Insufficient documentation

## 2017-02-08 DIAGNOSIS — Z87891 Personal history of nicotine dependence: Secondary | ICD-10-CM | POA: Insufficient documentation

## 2017-02-08 DIAGNOSIS — I25118 Atherosclerotic heart disease of native coronary artery with other forms of angina pectoris: Secondary | ICD-10-CM | POA: Diagnosis present

## 2017-02-08 DIAGNOSIS — I25110 Atherosclerotic heart disease of native coronary artery with unstable angina pectoris: Secondary | ICD-10-CM | POA: Insufficient documentation

## 2017-02-08 DIAGNOSIS — Z9861 Coronary angioplasty status: Secondary | ICD-10-CM

## 2017-02-08 DIAGNOSIS — I2583 Coronary atherosclerosis due to lipid rich plaque: Secondary | ICD-10-CM | POA: Insufficient documentation

## 2017-02-08 DIAGNOSIS — N182 Chronic kidney disease, stage 2 (mild): Secondary | ICD-10-CM | POA: Insufficient documentation

## 2017-02-08 DIAGNOSIS — Z7982 Long term (current) use of aspirin: Secondary | ICD-10-CM | POA: Insufficient documentation

## 2017-02-08 HISTORY — PX: LEFT HEART CATH AND CORONARY ANGIOGRAPHY: CATH118249

## 2017-02-08 LAB — POCT ACTIVATED CLOTTING TIME
ACTIVATED CLOTTING TIME: 290 s
ACTIVATED CLOTTING TIME: 395 s

## 2017-02-08 SURGERY — LEFT HEART CATH AND CORONARY ANGIOGRAPHY
Anesthesia: LOCAL

## 2017-02-08 MED ORDER — CLOPIDOGREL BISULFATE 75 MG PO TABS
75.0000 mg | ORAL_TABLET | Freq: Every day | ORAL | Status: DC
  Administered 2017-02-09: 75 mg via ORAL
  Filled 2017-02-08: qty 1

## 2017-02-08 MED ORDER — ACETAMINOPHEN 325 MG PO TABS: 650.0000 mg | ORAL_TABLET | ORAL | Status: DC | PRN

## 2017-02-08 MED ORDER — HEPARIN (PORCINE) IN NACL 2-0.9 UNIT/ML-% IJ SOLN
INTRAMUSCULAR | Status: AC | PRN
  Administered 2017-02-08: 1000 mL via INTRA_ARTERIAL

## 2017-02-08 MED ORDER — NEBIVOLOL HCL 5 MG PO TABS
5.0000 mg | ORAL_TABLET | Freq: Every day | ORAL | Status: DC
  Administered 2017-02-09: 5 mg via ORAL
  Filled 2017-02-08: qty 1

## 2017-02-08 MED ORDER — HEPARIN (PORCINE) IN NACL 2-0.9 UNIT/ML-% IJ SOLN
INTRAMUSCULAR | Status: AC
  Filled 2017-02-08: qty 1000

## 2017-02-08 MED ORDER — ASPIRIN 81 MG PO CHEW: 81.0000 mg | CHEWABLE_TABLET | ORAL | Status: DC

## 2017-02-08 MED ORDER — IOPAMIDOL (ISOVUE-370) INJECTION 76%
INTRAVENOUS | Status: DC | PRN
  Administered 2017-02-08: 150 mL via INTRA_ARTERIAL

## 2017-02-08 MED ORDER — SODIUM CHLORIDE 0.9% FLUSH: 3.0000 mL | Freq: Two times a day (BID) | INTRAVENOUS | Status: DC

## 2017-02-08 MED ORDER — LABETALOL HCL 5 MG/ML IV SOLN: 10.0000 mg | INTRAVENOUS | Status: AC | PRN

## 2017-02-08 MED ORDER — HEPARIN SODIUM (PORCINE) 1000 UNIT/ML IJ SOLN
INTRAMUSCULAR | Status: AC
  Filled 2017-02-08: qty 1

## 2017-02-08 MED ORDER — PNEUMOCOCCAL VAC POLYVALENT 25 MCG/0.5ML IJ INJ
0.5000 mL | INJECTION | INTRAMUSCULAR | Status: AC
  Administered 2017-02-09: 0.5 mL via INTRAMUSCULAR
  Filled 2017-02-08: qty 0.5

## 2017-02-08 MED ORDER — LOSARTAN POTASSIUM 25 MG PO TABS
25.0000 mg | ORAL_TABLET | Freq: Every day | ORAL | Status: DC
  Administered 2017-02-08: 25 mg via ORAL
  Filled 2017-02-08: qty 1

## 2017-02-08 MED ORDER — TACROLIMUS 0.5 MG PO CAPS
0.5000 mg | ORAL_CAPSULE | Freq: Every day | ORAL | Status: DC
  Administered 2017-02-08: 0.5 mg via ORAL
  Filled 2017-02-08: qty 1

## 2017-02-08 MED ORDER — TACROLIMUS 0.5 MG PO CAPS: 0.5000 mg | ORAL_CAPSULE | ORAL | Status: DC

## 2017-02-08 MED ORDER — ONDANSETRON HCL 4 MG/2ML IJ SOLN: 4.0000 mg | Freq: Four times a day (QID) | INTRAMUSCULAR | Status: DC | PRN

## 2017-02-08 MED ORDER — PANTOPRAZOLE SODIUM 40 MG PO TBEC
40.0000 mg | DELAYED_RELEASE_TABLET | Freq: Every day | ORAL | Status: DC
  Filled 2017-02-08: qty 1

## 2017-02-08 MED ORDER — MYCOPHENOLATE MOFETIL 250 MG PO CAPS
750.0000 mg | ORAL_CAPSULE | Freq: Two times a day (BID) | ORAL | Status: DC
  Administered 2017-02-08 – 2017-02-09 (×2): 750 mg via ORAL
  Filled 2017-02-08 (×2): qty 3

## 2017-02-08 MED ORDER — MIDAZOLAM HCL 2 MG/2ML IJ SOLN
INTRAMUSCULAR | Status: AC
Start: 2017-02-08 — End: 2017-02-08
  Filled 2017-02-08: qty 2

## 2017-02-08 MED ORDER — ZOLPIDEM TARTRATE 5 MG PO TABS
5.0000 mg | ORAL_TABLET | Freq: Every evening | ORAL | Status: DC | PRN
  Administered 2017-02-08: 5 mg via ORAL
  Filled 2017-02-08: qty 1

## 2017-02-08 MED ORDER — SODIUM CHLORIDE 0.9% FLUSH: 3.0000 mL | INTRAVENOUS | Status: DC | PRN

## 2017-02-08 MED ORDER — HYDRALAZINE HCL 20 MG/ML IJ SOLN: 5.0000 mg | INTRAMUSCULAR | Status: AC | PRN

## 2017-02-08 MED ORDER — ATORVASTATIN CALCIUM 80 MG PO TABS
80.0000 mg | ORAL_TABLET | Freq: Every day | ORAL | Status: DC
  Administered 2017-02-08: 80 mg via ORAL
  Filled 2017-02-08 (×2): qty 1

## 2017-02-08 MED ORDER — ISOSORBIDE MONONITRATE ER 30 MG PO TB24
30.0000 mg | ORAL_TABLET | Freq: Every day | ORAL | Status: DC
  Administered 2017-02-08: 30 mg via ORAL
  Filled 2017-02-08 (×2): qty 1

## 2017-02-08 MED ORDER — HEPARIN SODIUM (PORCINE) 1000 UNIT/ML IJ SOLN
INTRAMUSCULAR | Status: DC | PRN
  Administered 2017-02-08: 4000 [IU] via INTRAVENOUS
  Administered 2017-02-08 (×2): 5000 [IU] via INTRAVENOUS

## 2017-02-08 MED ORDER — ASPIRIN EC 81 MG PO TBEC
81.0000 mg | DELAYED_RELEASE_TABLET | Freq: Every day | ORAL | Status: DC
  Administered 2017-02-09: 81 mg via ORAL
  Filled 2017-02-08: qty 1

## 2017-02-08 MED ORDER — IOPAMIDOL (ISOVUE-370) INJECTION 76%
INTRAVENOUS | Status: AC
Start: 2017-02-08 — End: 2017-02-08
  Filled 2017-02-08: qty 100

## 2017-02-08 MED ORDER — PREDNISONE 5 MG PO TABS
5.0000 mg | ORAL_TABLET | Freq: Every day | ORAL | Status: DC
  Administered 2017-02-09: 5 mg via ORAL
  Filled 2017-02-08: qty 1

## 2017-02-08 MED ORDER — SODIUM CHLORIDE 0.9 % IV SOLN: 250.0000 mL | INTRAVENOUS | Status: DC | PRN

## 2017-02-08 MED ORDER — LIDOCAINE HCL 2 % IJ SOLN
INTRAMUSCULAR | Status: AC
  Filled 2017-02-08: qty 10

## 2017-02-08 MED ORDER — VITAMIN D3 25 MCG (1000 UNIT) PO TABS: 5000.0000 [IU] | ORAL_TABLET | ORAL | Status: DC

## 2017-02-08 MED ORDER — ANGIOPLASTY BOOK
Freq: Once | Status: AC
  Administered 2017-02-08: 21:00:00
  Filled 2017-02-08: qty 1

## 2017-02-08 MED ORDER — VERAPAMIL HCL 2.5 MG/ML IV SOLN
INTRAVENOUS | Status: DC | PRN
  Administered 2017-02-08: 10 mL via INTRA_ARTERIAL

## 2017-02-08 MED ORDER — TACROLIMUS 1 MG PO CAPS
1.0000 mg | ORAL_CAPSULE | ORAL | Status: DC
Start: 2017-02-10 — End: 2017-02-09

## 2017-02-08 MED ORDER — IOPAMIDOL (ISOVUE-370) INJECTION 76%
INTRAVENOUS | Status: AC
  Filled 2017-02-08: qty 100

## 2017-02-08 MED ORDER — VERAPAMIL HCL 2.5 MG/ML IV SOLN
INTRAVENOUS | Status: AC
Start: 2017-02-08 — End: 2017-02-08
  Filled 2017-02-08: qty 2

## 2017-02-08 MED ORDER — SODIUM CHLORIDE 0.9 % IV SOLN
INTRAVENOUS | Status: DC
Start: 2017-02-08 — End: 2017-02-08
  Administered 2017-02-08: 11:00:00 via INTRAVENOUS

## 2017-02-08 MED ORDER — LIDOCAINE HCL (PF) 1 % IJ SOLN
INTRAMUSCULAR | Status: DC | PRN
  Administered 2017-02-08: 2 mL via INTRADERMAL

## 2017-02-08 MED ORDER — SODIUM CHLORIDE 0.9 % WEIGHT BASED INFUSION
1.0000 mL/kg/h | INTRAVENOUS | Status: AC
  Administered 2017-02-08 (×2): 1 mL/kg/h via INTRAVENOUS

## 2017-02-08 MED ORDER — FAMOTIDINE 20 MG PO TABS
20.0000 mg | ORAL_TABLET | Freq: Every day | ORAL | Status: DC
  Administered 2017-02-08: 20 mg via ORAL
  Filled 2017-02-08 (×2): qty 1

## 2017-02-08 MED ORDER — NITROGLYCERIN 0.4 MG SL SUBL: 0.4000 mg | SUBLINGUAL_TABLET | SUBLINGUAL | Status: DC | PRN

## 2017-02-08 MED ORDER — MIDAZOLAM HCL 2 MG/2ML IJ SOLN
INTRAMUSCULAR | Status: DC | PRN
  Administered 2017-02-08: 1 mg via INTRAVENOUS
  Administered 2017-02-08: 2 mg via INTRAVENOUS

## 2017-02-08 MED ORDER — TACROLIMUS 0.5 MG PO CAPS
0.5000 mg | ORAL_CAPSULE | ORAL | Status: DC
  Filled 2017-02-08: qty 1

## 2017-02-08 MED ORDER — NIACIN ER (ANTIHYPERLIPIDEMIC) 500 MG PO TBCR
1000.0000 mg | EXTENDED_RELEASE_TABLET | Freq: Every day | ORAL | Status: DC
  Administered 2017-02-08: 1000 mg via ORAL
  Filled 2017-02-08: qty 2

## 2017-02-08 MED ORDER — ALLOPURINOL 100 MG PO TABS
100.0000 mg | ORAL_TABLET | Freq: Every day | ORAL | Status: DC
  Administered 2017-02-09: 100 mg via ORAL
  Filled 2017-02-08: qty 1

## 2017-02-08 MED ORDER — FENTANYL CITRATE (PF) 100 MCG/2ML IJ SOLN
INTRAMUSCULAR | Status: AC
  Filled 2017-02-08: qty 2

## 2017-02-08 MED ORDER — FENTANYL CITRATE (PF) 100 MCG/2ML IJ SOLN
INTRAMUSCULAR | Status: DC | PRN
  Administered 2017-02-08 (×2): 25 ug via INTRAVENOUS

## 2017-02-08 SURGICAL SUPPLY — 19 items
BALLN EMERGE MR 2.5X15 (BALLOONS) ×2
BALLN SAPPHIRE ~~LOC~~ 2.5X12 (BALLOONS) ×2 IMPLANT
BALLN SAPPHIRE ~~LOC~~ 3.5X10 (BALLOONS) ×2 IMPLANT
BALLOON EMERGE MR 2.5X15 (BALLOONS) ×1 IMPLANT
CATH 5FR JL3.5 JR4 ANG PIG MP (CATHETERS) ×2 IMPLANT
CATH INFINITI 5 FR AR1 MOD (CATHETERS) ×2 IMPLANT
CATH LAUNCHER 6FR AL1 (CATHETERS) ×1 IMPLANT
CATHETER LAUNCHER 6FR AL1 (CATHETERS) ×2
DEVICE RAD COMP TR BAND LRG (VASCULAR PRODUCTS) ×2 IMPLANT
GLIDESHEATH SLEND SS 6F .021 (SHEATH) ×2 IMPLANT
GUIDEWIRE INQWIRE 1.5J.035X260 (WIRE) ×1 IMPLANT
INQWIRE 1.5J .035X260CM (WIRE) ×2
KIT ENCORE 26 ADVANTAGE (KITS) ×2 IMPLANT
KIT HEART LEFT (KITS) ×2 IMPLANT
PACK CARDIAC CATHETERIZATION (CUSTOM PROCEDURE TRAY) ×2 IMPLANT
STENT SYNERGY DES 3X28 (Permanent Stent) ×2 IMPLANT
TRANSDUCER W/STOPCOCK (MISCELLANEOUS) ×2 IMPLANT
TUBING CIL FLEX 10 FLL-RA (TUBING) ×2 IMPLANT
WIRE COUGAR XT STRL 190CM (WIRE) ×2 IMPLANT

## 2017-02-08 NOTE — Interval H&P Note (Signed)
Cath Lab Visit (complete for each Cath Lab visit)  Clinical Evaluation Leading to the Procedure:   ACS: No.  Non-ACS:    Anginal Classification: CCS III  Anti-ischemic medical therapy: Maximal Therapy (2 or more classes of medications)  Non-Invasive Test Results: No non-invasive testing performed  Prior CABG: No previous CABG      History and Physical Interval Note:  02/08/2017 3:49 PM  [name]  has presented today for surgery, with the diagnosis of cp  The various methods of treatment have been discussed with the patient and family. After consideration of risks, benefits and other options for treatment, the patient has consented to  Procedure(s): LEFT HEART CATH AND CORONARY ANGIOGRAPHY (N/A) as a surgical intervention .  The patient's history has been reviewed, patient examined, no change in status, stable for surgery.  I have reviewed the patient's chart and labs.  Questions were answered to the patient's satisfaction.     Sherren Mocha

## 2017-02-08 NOTE — H&P (View-Only) (Signed)
Cardiology Office Note:    Date:  02/06/2017   ID:  Roy Dyer, DOB 1965-08-01, MRN [id number]  PCP:  [name], MD  Cardiologist:  Schuyler Amor  Referring MD: [name], MD  Electrophysiologist: [name]   Chief Complaint  Patient presents with  . Shortness of Breath    and chest tightness    History of Present Illness:    Roy Dyer is a 51 y.o. male with a past medical history significant for Lupus, CAD status post MI 2001 in 2012 with stents, ICD due to low EF, hypertension, hyperlipidemia, OSA, GERD, CKD and renal transplant 2014, CHF.  Mr. Capell has a history of CAD who suffered anterior MI in March 2012. He underwent bare metal stenting to the LAD at that time. He has previous stents to the circumflex and RCA in 2001. After his 2012 MI the patient had a low EF which remain <35% at 90 days post revascularization and thus an ICD was placed by [name] 11/20/10. He had a large mural apical thrombus at the time and was anticoagulated and subsequently the thrombus resolved and the Coumadin was stopped.  The patient had a hip replacement in 07/2010 and had worse renal function and ended up with renal transplant at [facility]. He sees every 6 months, last was in 10/2016, and Dr. Moshe Cipro every 4 months. His niece was the donor.  The last ICD download was on 01/29/17 and showed normal functioning. Most recent echocardiogram on 04/23/2016 showed EF 30%  Today the patient is here alone. He has complaints of a 2-4 weeks history of shortness of breath and upper chest tightness/burning that goes up into his throat. This occurs with exertion like climbing one flight of stairs and walking in from the car to the office or his house to the car. It resolves with rest after 3-5 minutes. No chest pain at rest and does not wake him at night. He has not taken NTG. This occurs 2-3 times per day. This is a definite new complaint per the patient. He has no orthopnea, PND, edema, palpitations,  nausea, diaphoresis or lightheadedness. He does not feel that he could make it through a stress test.  Recent labs at nephrology office on 01/16/17 showed SCr 1.3, Mag. 1.5, Hgb 17.5 (pt had therapeutic phlebotomy after).   Past Medical History:  Diagnosis Date  . AICD (automatic cardioverter/defibrillator) present    Dr. Paschal Dopp follows remotely-yrly checks, Dr. Jonne Ply  . Avascular necrosis of bone of hip ([place]) 2010 surg   Left hip arthroplasty: from chronic systemic steroids taken for his Lupus  . Barrett's esophagus   . Blood transfusion without reported diagnosis 2010, 2012  . CAD (coronary artery disease)     stents RCA/Circ 2001, BMS to LAD 07/2010  . Cardiomyopathy    (Ischemic):  Chronic systolic dysfunction--  42/7062 EF 30-35%.   Single chamber ICD 11/20/10 ([name])  . Cataract    left eye  . CHF (congestive heart failure) ([place])   . Chronic headaches    Dr. [name] 06/2016: suspects tension HA's.  CT brain was considered (MRI was not approved by insurer) but ended up not being needed as of 09/2016.  His neurontin was increased to 600 mg bid but this caused hyperkalemia so he d/c'd this med.  HA's improved with decre caffeine and incre water.   Also got referred for eval for possible OSA.  Renal MD suspicous that his polycyt may be cause HAs.  [name]  Chronic renal insufficiency, stage II (mild)    GFR 65 ml/min 07/19/15 at local renal f/u (Cr 1.29).  GFR 64 ml/min (cr 1.3) at local renal f/u 01/16/17.  . End stage renal disease ([place]) 04/24/2011   Secondary to SLE: HD 06/2011-10/2012 (then got renal transplant)  . GERD (gastroesophageal reflux disease)    Hx of esoph stricture and dilatation.  +Barrett's esophagus+  . Gout    s/p renal transplant he was weened off of his allopurinol.  . Hiatal hernia   . History of renal transplant 11/10/2012   10/2012 [facility]   . Hyperkalemia 07/2016  . Hyperlipidemia   . Hypertension   . implantable cardiac defibrillator single chamber    Medtronic  (due to low EF)  . Left ventricular thrombus 2012   Re-eval 02/2011 showed thrombus RESOLVED, so coumadin was d/c'd (was on it for 38mo)  . Lupus   . Myocardial infarction [facility]) 2001 and 2012   3 stents and defib placed in July  . OSA (obstructive sleep apnea) 2018   Eval by Dr. Annamaria Boots 01/2017-- plan for unattended home sleep test.  . Osteoporosis   . Post-transplant erythrocytosis    improved with ARB (valsartan)    Past Surgical History:  Procedure Laterality Date  . AV FISTULA PLACEMENT  03/28/2011   Procedure: ARTERIOVENOUS (AV) FISTULA CREATION;  Surgeon: Rosetta Posner, MD;  Location: [facility];  Service: Vascular;  Laterality: Left;  Creation of Left radiocephallic cimino fistula  . AV FISTULA PLACEMENT  04/27/2011   Procedure: ARTERIOVENOUS (AV) FISTULA CREATION;  Surgeon: Rosetta Posner, MD;  Location: [facility];  Service: Vascular;  Laterality: Left;  left basilic vein transposition  . CARDIAC CATHETERIZATION     08/2010  . CARDIOVASCULAR STRESS TEST  07/2010; 03/2014   03/2014 showed large old infarct and EF 30-35% but no ischemia  . ESOPHAGOGASTRODUODENOSCOPY  02/2009   Done by Dr. Fuller Plan for hematemesis; esophagitis found.  Repeat recommended 09/2016, at which time he will also likely get his initial screening colonoscopy.  . ESOPHAGOGASTRODUODENOSCOPY (EGD) WITH PROPOFOL N/A 09/29/2015   REPEAT EGD RECOMMENDED 09/2016.  Barrett's esoph + mild chronic gastritis.  H pylori NEG. Procedure: ESOPHAGOGASTRODUODENOSCOPY (EGD) WITH PROPOFOL;  Surgeon: Jerene Bears, MD;  Location: [facility];  Service: Gastroenterology;  Laterality: N/A;  . INSERT / REPLACE / REMOVE PACEMAKER     medtronic        dr [name]    [place]   icd only  . KIDNEY TRANSPLANT  10/2012   [facility] nephrologist--Dr. Blair Heys  . PARTIAL HIP ARTHROPLASTY Left 2010   left  . RENAL BIOPSY    . stents     05-2010 and 2- in 2010  . TOTAL HIP ARTHROPLASTY  07/09/2011   Procedure: TOTAL HIP ARTHROPLASTY;  Surgeon: Kerin Salen,  MD;  Location: [place];  Service: Orthopedics;  Laterality: Right;  . TYMPANOSTOMY TUBE PLACEMENT  51 yrs old  . US ECHOCARDIOGRAPHY  12/2010; 02/2014;08/2014   02/2014 EF still 25-30%, increased PA pressures.  2016 EF 30-35%, sept/inf hypokinesis, mod tricusp regurg    Current Medications: Current Meds  Medication Sig  . acetaminophen (TYLENOL) 500 MG tablet Take 1,000 mg by mouth every 6 (six) hours as needed for headache (pain).   [name] allopurinol (ZYLOPRIM) 100 MG tablet Take 1 tablet (100 mg total) by mouth daily.  [name] aspirin EC 81 MG tablet Take 81 mg by mouth daily.  [name] atorvastatin (LIPITOR) 80 MG tablet TAKE 1  TABLET BY MOUTH DAILY  . BYSTOLIC 5 MG tablet TAKE 1 TABLET BY MOUTH DAILY  . Cholecalciferol (VITAMIN D-3) 5000 units TABS Take 1 tablet by mouth 4 (four) times a week. Sunday, Monday, Wednesday, Friday morning  . clopidogrel (PLAVIX) 75 MG tablet TAKE 1 TABLET BY MOUTH DAILY  . DEXILANT 60 MG capsule TAKE 1 CAPSULE BY MOUTH DAILY BEFORE BREAKFAST  . losartan (COZAAR) 25 MG tablet Take 1 tablet by mouth daily.  . mycophenolate (CELLCEPT) 250 MG capsule Take 3 capsules (750 mg total) by mouth 2 (two) times daily.  . niacin (NIASPAN) 500 MG CR tablet TAKE 2 TABLETS BY MOUTH AT BEDTIME  . nitroGLYCERIN (NITROSTAT) 0.4 MG SL tablet USE 1 TAB UNDER TONGUE EVERY 5 MINUTES UP TO 3 DOSES AS NEEDED  . predniSONE (DELTASONE) 5 MG tablet TAKE 1 TABLET BY MOUTH DAILY  . ranitidine (ZANTAC) 150 MG tablet TAKE 1 TABLET BY MOUTH AT BEDTIME  . tacrolimus (PROGRAF) 0.5 MG capsule Take 0.5-1 mg by mouth See admin instructions. Take 1 capsule (0.5 mg) by mouth daily at bedtime; take 2 capsules (1 mg) Sunday, Monday, Wednesday, Friday mornings, take 1 capsule (0.5 mg) Tuesday, Thursday, Saturday mornings  . zolpidem (AMBIEN) 5 MG tablet Take 5 mg by mouth at bedtime as needed for sleep.  . [DISCONTINUED] NITROSTAT 0.4 MG SL tablet USE 1 TAB UNDER TONGUE EVERY 5 MINUTES UP TO 3 DOSES AS NEEDED      Allergies:   Triptans   Social History   Social History  . Marital status: Married    Spouse name: N/A  . Number of children: N/A  . Years of education: N/A   Occupational History  . CAD Drafter  [employer]   Social History Main Topics  . Smoking status: Former Smoker    Packs/day: 0.50    Years: 30.00    Types: Cigarettes    Quit date: 08/02/2010  . Smokeless tobacco: Never Used  . Alcohol use No  . Drug use: No  . Sexual activity: Yes    Partners: Female   Other Topics Concern  . None   Social History Narrative   Married, 1 teenage son and 1 teenage daughter.    Occupation: Printmaker.   15 pack-yr smoking hx, quit 07/2010.   Drug Use - no   No alcohol.              Family History: The patient's family history includes Diabetes in his mother; Heart attack in his father and paternal grandfather; Heart attack (age of onset: 83) in his sister; Heart disease in his paternal aunt, paternal uncle, and sister; Hyperlipidemia in his father and sister; Hypertension in his father and sister; Kidney failure in his mother; Lupus in his mother; Stroke in his mother. There is no history of Colon cancer, Esophageal cancer, Stomach cancer, or Rectal cancer. ROS:   Please see the history of present illness.     All other systems reviewed and are negative.  EKGs/Labs/Other Studies Reviewed:    The following studies were reviewed today:  Echocardiogram 04/23/2016 Study Conclusions  - Left ventricle: The cavity size was moderately dilated. Wall   thickness was increased in a pattern of mild LVH. The estimated   ejection fraction was 30%. Left ventricular diastolic function   parameters were normal. - Left atrium: The atrium was severely dilated. - Atrial septum: No defect or patent foramen ovale was identified. - Pulmonary arteries: PA peak pressure: 55 mm Hg (S).  EKG:  EKG is  ordered today.  The ekg ordered today demonstrates sinus rhythm with 1st degree AVB, PRI  0.210, LAD, no acute ischemic changes  Recent Labs: 07/15/2016: B Natriuretic Peptide 379.2 07/17/2016: ALT 15; Magnesium 1.9 08/01/2016: Hemoglobin 16.4; Platelets 223.0 08/28/2016: BUN 19; Creatinine, Ser 1.33; Potassium 4.7; Sodium 142   Recent Lipid Panel    Component Value Date/Time   CHOL 127 03/22/2015 0803   TRIG 70.0 03/22/2015 0803   HDL 60.50 03/22/2015 0803   CHOLHDL 2 03/22/2015 0803   VLDL 14.0 03/22/2015 0803   LDLCALC 53 03/22/2015 0803    Physical Exam:    VS:  BP 136/60   Pulse 67   Ht 5\' 7"  (1.702 m)   Wt 222 lb 12.8 oz (101.1 kg)   SpO2 98%   BMI 34.90 kg/m     Wt Readings from Last 3 Encounters:  02/06/17 222 lb 12.8 oz (101.1 kg)  02/04/17 222 lb 4.8 oz (100.8 kg)  01/18/17 221 lb 6.4 oz (100.4 kg)     Physical Exam  Constitutional: He is oriented to person, place, and time. He appears well-developed and well-nourished. No distress.  HENT:  Head: Normocephalic and atraumatic.  Neck: Normal range of motion. Neck supple. No JVD present.  Cardiovascular: Normal rate.  Exam reveals no gallop and no friction rub.   No murmur heard. Pulmonary/Chest: Effort normal and breath sounds normal. No respiratory distress. He has no wheezes. He has no rales. He exhibits no tenderness.  Abdominal: Soft. Bowel sounds are normal. There is no tenderness.  Musculoskeletal: Normal range of motion. He exhibits no edema.  Neurological: He is alert and oriented to person, place, and time.  Skin: Skin is warm and dry.  Psychiatric: He has a normal mood and affect. His behavior is normal. Thought content normal.     ASSESSMENT:    1. Unstable angina pectoris ([place])   2. ischemic cardiomyopathy status post anterolateral MI   3. Coronary artery disease due to lipid rich plaque   4. History of renal transplant   5. Mixed hyperlipidemia    PLAN:    In order of problems listed above:  Chest pain: Pt has exertional upper chest tightness to the throat with associated  shortness of breath with minimal exertion and relieved with rest. Reviewed case with Dr. Johnsie Cancel. With these typical symptoms concerning for unstable angina and hx of anterior MI, pt needs cardiac cath for further definite cardiac evaluation. I spoke with Dr. Moshe Cipro, patient's nephrologist,  regarding renal protection. She advises that pt has been very stable with his post renal transplant and that evidence states that with good renal function their is no evidence that extra precautions should be taken. She advises some gentle pre-hydration and no need for bicarb drip to be administered and hold ARB surrounding cath.        -LHC scheduled for Friday with Dr. Burt Knack       -Gentle precath hydration with NS at 50 ml/hr for 4 hours       -Pre-cath labs today       -add Imdur 30 mg daily, SL NTG as needed.       -Pt instructed to hold losartan night before cath (takes it at night)       -Pt instructed to go to ED if has chest discomfort/shortness of breath that dose not resolve with rest and/or NTG.  The patient understands that risks included but are not limited to stroke (1 in  1000), death (1 in 60), kidney failure [usually temporary] (1 in 500), bleeding (1 in 200), allergic reaction [possibly serious] (1 in 200). He is willing to proceed. He thinks he has had about 5 caths before.   Ischemic cardomyopathy:  EF 30% by last echo in 04/2016. Has ICD  CAD: History of MI 2 and several stents, 2001 & 2012. Patient is on aspirin 81 mg,  Plavix, statin, beta blocker, ARB  AICD: Normal device function per last down load 01/29/17  CKD:  History of renal transplant, on antirejection medications.  Followed at [facility]. Last serum creatinine was 1.4 in 10/2016. Magnesium was low at that time 1.5. Per [facility] notes baseline SCr is 1.3-1.4  Hyperlipidemia: Lipid panel from 07/10/16 shows LDL 47 which is at goal <70. Continue atorvastatin 80 mg   Medication Adjustments/Labs and Tests Ordered: Current medicines  are reviewed at length with the patient today.  Concerns regarding medicines are outlined above. Labs and tests ordered and medication changes are outlined in the patient instructions below:  Patient Instructions  Medication Instructions:  1. START IMDUR 30 MG DAILY; RX HAS BEEN SENT IN  Labwork: 1. TODAY BMET, CBC, PT/INR; PRE CATH LABS  Testing/Procedures: Your physician has requested that you have a cardiac catheterization. Cardiac catheterization is used to diagnose and/or treat various heart conditions. Doctors may recommend this procedure for a number of different reasons. The most common reason is to evaluate chest pain. Chest pain can be a symptom of coronary artery disease (CAD), and cardiac catheterization can show whether plaque is narrowing or blocking your heart's arteries. This procedure is also used to evaluate the valves, as well as measure the blood flow and oxygen levels in different parts of your heart. For further information please visit [email or web address][email or web address]. Please follow instruction sheet, as given.    Follow-Up: YOU WILL NEED TO BE SCHEDULED FOR A POST CATH FOLLOW WITH DR. Johnsie Cancel OR ONE HIS CARE TEAM MEMBERS  Any Other Special Instructions Will Be Listed Below (If Applicable). If you need a refill on your cardiac medications before your next appointment, please call your pharmacy.    [facility] [address], [place] [address] Dept: [phone] Loc: [place]  02/06/2017  You are scheduled for a Cardiac Catheterization on Friday, September 28 with Dr. Sherren Mocha.  1. Please arrive at the [facility] (Main Entrance A) at [facility]: [address] [place], [place] [address] at 9:30 AM (two hours before your procedure to ensure your preparation). Free valet parking service is available.   Special note: Every effort is made to have  your procedure done on time. Please understand that emergencies sometimes delay scheduled procedures.  2. Diet: Do not eat or drink anything after midnight prior to your procedure except sips of water to take medications.  3. Labs: You will need to have blood drawn on Wednesday, September 26 at [facility] at [facility]. [address]  Open: 7:30am - 5pm    Phone: [phone]. You do not need to be fasting.  4. Medication instructions in preparation for your procedure: On the morning of your procedure, take your Aspirin and  PLAVIX any morning medicines NOT listed above.  You may use SMALL sips of water.  5. Plan for one night stay--bring personal belongings. 6. Bring a current list of your medications and current insurance cards. 7. You  MUST have a responsible person to drive you home. 8. Someone MUST be with you the first 24 hours after you arrive home or your discharge will be delayed. 9. Please wear clothes that are easy to get on and off and wear slip-on shoes.  Thank you for allowing [place] to care for you!   -- [facility] Invasive Cardiovascular services     Signed, Daune Perch, NP  02/06/2017 11:36 AM    [place]

## 2017-02-08 NOTE — Telephone Encounter (Signed)
-----   Message from Pieter Partridge, DO sent at 02/05/2017  7:24 AM EDT ----- Sed rate is normal

## 2017-02-08 NOTE — Telephone Encounter (Signed)
LM on VM advising Pt of normal sed rate

## 2017-02-09 ENCOUNTER — Encounter (HOSPITAL_COMMUNITY): Payer: Self-pay | Admitting: Physician Assistant

## 2017-02-09 DIAGNOSIS — I25118 Atherosclerotic heart disease of native coronary artery with other forms of angina pectoris: Secondary | ICD-10-CM

## 2017-02-09 DIAGNOSIS — I25110 Atherosclerotic heart disease of native coronary artery with unstable angina pectoris: Secondary | ICD-10-CM | POA: Diagnosis not present

## 2017-02-09 LAB — CBC
HEMATOCRIT: 41.8 % (ref 39.0–52.0)
HEMOGLOBIN: 13.7 g/dL (ref 13.0–17.0)
MCH: 30.2 pg (ref 26.0–34.0)
MCHC: 32.8 g/dL (ref 30.0–36.0)
MCV: 92.1 fL (ref 78.0–100.0)
Platelets: 135 10*3/uL — ABNORMAL LOW (ref 150–400)
RBC: 4.54 MIL/uL (ref 4.22–5.81)
RDW: 13.3 % (ref 11.5–15.5)
WBC: 7.8 10*3/uL (ref 4.0–10.5)

## 2017-02-09 LAB — BASIC METABOLIC PANEL
ANION GAP: 5 (ref 5–15)
BUN: 12 mg/dL (ref 6–20)
CO2: 28 mmol/L (ref 22–32)
Calcium: 8.2 mg/dL — ABNORMAL LOW (ref 8.9–10.3)
Chloride: 107 mmol/L (ref 101–111)
Creatinine, Ser: 1.22 mg/dL (ref 0.61–1.24)
Glucose, Bld: 124 mg/dL — ABNORMAL HIGH (ref 65–99)
POTASSIUM: 4 mmol/L (ref 3.5–5.1)
SODIUM: 140 mmol/L (ref 135–145)

## 2017-02-09 NOTE — Progress Notes (Signed)
CARDIAC REHAB PHASE I   PRE:  Rate/Rhythm: 74 NSR  BP:   Sitting: 154/67 @calf      SaO2: 98% RA  MODE:  Ambulation: 360 ft   POST:  Rate/Rhythm: 84 SR  BP:   Sitting: 132/76 R arm     SaO2: 95% RA  Pt ambulated 353ft independently,and with a steady gait and pace. Pt denied symptoms of CP, dizziness and SOB. Returned pt to bedside with VSS. Education completed, discussed stent card, importance of antiplatelets,risk factors, lifting/activity restrictions, exercise guidelines, NTG use and emergency precautions. Further discussed nutrition and getting back on track with [place]. Pt voiced understanding. Referred pt to cardiac rehab at [facility]. Very pleasant to talk to and education was received well/reinforced.   Sherae Santino D Kelcy Laible,MS,ACSM-RCEP 02/09/2017 9:20

## 2017-02-09 NOTE — Discharge Instructions (Signed)

## 2017-02-09 NOTE — Progress Notes (Addendum)
TR BAND REMOVAL  LOCATION:    right radial  DEFLATED PER PROTOCOL:    Yes.    TIME BAND OFF / DRESSING APPLIED:    0030   SITE UPON ARRIVAL:    Level 1  SITE AFTER BAND REMOVAL:    Level 1  CIRCULATION SENSATION AND MOVEMENT:    Within Normal Limits   Yes.    COMMENTS:   Radial site teaching reinforced. Pt verbalized and demonstrates understanding.

## 2017-02-09 NOTE — Discharge Summary (Signed)
Discharge Summary    Patient ID: Roy Dyer,  MRN: [id number], DOB/AGE: Apr 07, 1966 51 y.o.  Admit date: 02/08/2017 Discharge date: 02/09/2017  Primary Care Provider: [name] Primary Cardiologist: Dr. Johnsie Cancel / [name] (EP)  Discharge Diagnoses    Principal Problem:   Coronary artery disease with exertional angina [facility]) Active Problems:   Mixed hyperlipidemia   Essential hypertension, benign   ischemic cardiomyopathy status post anterolateral MI   LUPUS   Automatic implantable cardioverter-defibrillator in situ   History of renal transplant   GERD (gastroesophageal reflux disease)   Chronic renal insufficiency, stage II (mild)   Obstructive sleep apnea   Allergies Allergies  Allergen Reactions  . Triptans Palpitations    Reaction to Maxalt    Diagnostic Studies/Procedures    Cath 02/09/17 LEFT HEART CATH AND CORONARY ANGIOGRAPHY  Conclusion     Ost RCA to Prox RCA lesion, 0 %stenosed.  Dist RCA lesion, 75 %stenosed.  Prox LAD to Mid LAD lesion, 20 %stenosed.  Ost 1st Mrg to 1st Mrg lesion, 40 %stenosed.  A STENT SYNERGY DES 3X28 drug eluting stent was successfully placed.  Mid RCA lesion, 90 %stenosed.  Post intervention, there is a 0% residual stenosis.   1. Severe calcific RCA stenosis treated successfully with PCI (implantation of a 3.0x28 mm Synergy DES) 2. Patent LAD stent with mild in-stent restenosis 3. Patent LCx stent with mild in-stent restenosis  Recommend: continue DAPT long-term as tolerated (minimum 6 months without interruption)    _____________   History of Present Illness     Roy Dyer is a 50 y.o. male with a past medical history significant for Lupus, CAD status post MI 2001 in 2012 with stents, ICD due to low EF, hypertension, hyperlipidemia, OSA, GERD, CHD and CKD with renal transplant 2014 who presented to [facility] on 02/08/17 for planned cath.   Mr. Goates has a history of CAD who suffered anterior MI in March  2012. He underwent bare metal stenting to the LAD at that time. He has previous stents to the circumflex and RCA in 2001. After his 2012 MI the patient had a low EF which remain <35% at 90 days post revascularization and thus an ICD was placed by [name] 11/20/10. He had a large mural apical thrombus at the time and was anticoagulated and subsequently the thrombus resolved and the Coumadin was stopped.  The patient had a hip replacement in 07/2010 and had worse renal function and ended up with renal transplant at [facility]. He sees every 6 months, last was in 10/2016, and Dr. Moshe Cipro every 4 months. His niece was the donor.  The last ICD download was on 01/29/17 and showed normal functioning. Most recent echocardiogram on 04/23/2016 showed EF 30%  The patient was seen in the office on 02/06/17 by Daune Perch NP for evaluation of exertional SOB/chest pain x2-3 weeks concerning for [place] and he was set up for outpatient cardiac cath on 02/08/17. Dr. Moshe Cipro, patient's nephrologist, felt the patient's renal function had been stable and he only required some gentle pre hydration and hold ARB.   Hospital Course     Consultants: none   He was admitted for gentle gentle precath hydration with NS at 50 ml/hr for 4 hours. He underwent LHC by Dr. Burt Knack which showed 75% dRCA stenosis, 20% mLAD stenosis, 40% 1st Marg stenosis, 90% mRCA stenosis s/p PCI/DES. He was noted to have patent LAD and LCx stents with mild ISR. He was started  on ASA and plavix which will need to be continued for at least 6 months. His creat his AM is 1.22 and his Losartan has been resumed.   Radial site stable. He has been seen by Dr. Burt Knack who has deemed him stable for discharge home today. He already has follow up arranged for 10/16. Discharge medications below.  _____________  Discharge Vitals Blood pressure 135/76, pulse 63, temperature 98.3 F (36.8 C), temperature source Oral, resp. rate 12, height 5\' 7"  (1.702 m), weight  225 lb 8.5 oz (102.3 kg), SpO2 97 %.  Filed Weights   02/08/17 0931 02/09/17 0448  Weight: 220 lb (99.8 kg) 225 lb 8.5 oz (102.3 kg)      Labs & Radiologic Studies    CBC  Recent Labs  02/06/17 1130 02/09/17 0316  WBC 10.9* 7.8  HGB 16.7 13.7  HCT 48.4 41.8  MCV 90 92.1  PLT 190 923*   Basic Metabolic Panel  Recent Labs  02/06/17 1130 02/09/17 0316  NA 143 140  K 4.4 4.0  CL 101 107  CO2 25 28  GLUCOSE 114* 124*  BUN 15 12  CREATININE 1.02 1.22  CALCIUM 9.2 8.2*   Liver Function Tests No results for input(s): AST, ALT, ALKPHOS, BILITOT, PROT, ALBUMIN in the last 72 hours. No results for input(s): LIPASE, AMYLASE in the last 72 hours. Cardiac Enzymes No results for input(s): CKTOTAL, CKMB, CKMBINDEX, TROPONINI in the last 72 hours. BNP Invalid input(s): POCBNP D-Dimer No results for input(s): DDIMER in the last 72 hours. Hemoglobin A1C No results for input(s): HGBA1C in the last 72 hours. Fasting Lipid Panel No results for input(s): CHOL, HDL, LDLCALC, TRIG, CHOLHDL, LDLDIRECT in the last 72 hours. Thyroid Function Tests No results for input(s): TSH, T4TOTAL, T3FREE, THYROIDAB in the last 72 hours.  Invalid input(s): FREET3 _____________  No results found. Disposition   Pt is being discharged home today in good condition.  Follow-up Plans & Appointments    Follow-up Information    Burtis Junes, NP. Go on 02/26/2017.   Specialties:  Nurse Practitioner, Interventional Cardiology, Cardiology, Radiology Why:  @2pm .  Contact information: [place]. [address] [place] [address] [phone]            Discharge Medications   Current Discharge Medication List    CONTINUE these medications which have NOT CHANGED   Details  acetaminophen (TYLENOL) 500 MG tablet Take 1,000 mg by mouth every 6 (six) hours as needed for headache (pain).     allopurinol (ZYLOPRIM) 100 MG tablet Take 1 tablet (100 mg total) by mouth daily. Qty:  90 tablet, Refills: 3    aspirin EC 81 MG tablet Take 81 mg by mouth daily.    atorvastatin (LIPITOR) 80 MG tablet TAKE 1 TABLET BY MOUTH DAILY Qty: 90 tablet, Refills: 3    BYSTOLIC 5 MG tablet TAKE 1 TABLET BY MOUTH DAILY Qty: 90 tablet, Refills: 3    Cholecalciferol (VITAMIN D-3) 5000 units TABS Take 1 tablet by mouth 4 (four) times a week. Sunday, Monday, Wednesday, Friday morning    clopidogrel (PLAVIX) 75 MG tablet TAKE 1 TABLET BY MOUTH DAILY Qty: 90 tablet, Refills: 3    DEXILANT 60 MG capsule TAKE 1 CAPSULE BY MOUTH DAILY BEFORE BREAKFAST Qty: 90 capsule, Refills: 0    isosorbide mononitrate (IMDUR) 30 MG 24 hr tablet Take 1 tablet (30 mg total) by mouth daily. Qty: 90 tablet, Refills: 3    losartan (COZAAR) 25 MG tablet  Take 25 mg by mouth at bedtime.     mycophenolate (CELLCEPT) 250 MG capsule Take 3 capsules (750 mg total) by mouth 2 (two) times daily. Qty: 540 capsule, Refills: 1    niacin (NIASPAN) 500 MG CR tablet TAKE 2 TABLETS BY MOUTH AT BEDTIME Qty: 180 tablet, Refills: 3    nitroGLYCERIN (NITROSTAT) 0.4 MG SL tablet USE 1 TAB UNDER TONGUE EVERY 5 MINUTES UP TO 3 DOSES AS NEEDED Qty: 25 tablet, Refills: 3    predniSONE (DELTASONE) 5 MG tablet TAKE 1 TABLET BY MOUTH DAILY Qty: 90 tablet, Refills: 3    ranitidine (ZANTAC) 150 MG tablet TAKE 1 TABLET BY MOUTH AT BEDTIME Qty: 90 tablet, Refills: 0    tacrolimus (PROGRAF) 0.5 MG capsule Take 0.5-1 mg by mouth See admin instructions. Take 1 capsule (0.5 mg) by mouth daily at bedtime; take 2 capsules (1 mg)Monday, Wednesday, Friday, & Sunday mornings, take 1 capsule (0.5 mg) Tuesday, Thursday, Saturday mornings    zolpidem (AMBIEN) 5 MG tablet Take 5 mg by mouth at bedtime as needed for sleep.           Outstanding Labs/Studies   BMET  Duration of Discharge Encounter   Greater than 30 minutes including physician time.  Signed, Angelena Form NP 02/09/2017, 8:04 AM

## 2017-02-09 NOTE — Care Management Note (Signed)
Case Management Note  Patient Details  Name: Roy Dyer MRN: [id number] Date of Birth: June 17, 1965  Subjective/Objective:  From home, s/p PCI, will be on plavix. For dc today, no needs.                  Action/Plan:   Expected Discharge Date:  02/09/17               Expected Discharge Plan:  Home/Self Care  In-House Referral:     Discharge planning Services  CM Consult  Post Acute Care Choice:    Choice offered to:     DME Arranged:    DME Agency:     HH Arranged:    HH Agency:     Status of Service:  Completed, signed off  If discussed at [employer] of Stay Meetings, dates discussed:    Additional Comments:  Zenon Mayo, RN 02/09/2017, 9:36 AM

## 2017-02-09 NOTE — Progress Notes (Signed)
Progress Note  Patient Name: Roy Dyer Date of Encounter: 02/09/2017  Primary Cardiologist: Johnsie Cancel  Subjective   Feels well. No CP or dyspnea. Right arm sore.  Inpatient Medications    Scheduled Meds: . allopurinol  100 mg Oral Daily  . aspirin EC  81 mg Oral Daily  . atorvastatin  80 mg Oral Daily  . [START ON 02/10/2017] cholecalciferol  5,000 Units Oral Q M,W,F,Su-1800  . clopidogrel  75 mg Oral Daily  . famotidine  20 mg Oral Daily  . isosorbide mononitrate  30 mg Oral Daily  . losartan  25 mg Oral QHS  . mycophenolate  750 mg Oral BID  . nebivolol  5 mg Oral Daily  . niacin  1,000 mg Oral QHS  . pantoprazole  40 mg Oral Daily  . pneumococcal 23 valent vaccine  0.5 mL Intramuscular Tomorrow-1000  . predniSONE  5 mg Oral Daily  . sodium chloride flush  3 mL Intravenous Q12H  . tacrolimus  0.5 mg Oral QHS  . tacrolimus  0.5 mg Oral Once per day on Tue Thu Sat  . [START ON 02/10/2017] tacrolimus  1 mg Oral Once per day on Sun Mon Wed Fri   Continuous Infusions: . sodium chloride     PRN Meds: sodium chloride, acetaminophen, nitroGLYCERIN, ondansetron (ZOFRAN) IV, sodium chloride flush, zolpidem   Vital Signs    Vitals:   02/09/17 0300 02/09/17 0448 02/09/17 0500 02/09/17 0739  BP: 127/62 (!) 142/70 132/78 135/76  Pulse: 65 (!) 56 (!) 58 63  Resp: (!) 23 (!) 23 (!) 22 12  Temp:  98 F (36.7 C)  98.3 F (36.8 C)  TempSrc:  Oral  Oral  SpO2: 99% 98% 97% 97%  Weight:  102.3 kg (225 lb 8.5 oz)    Height:        Intake/Output Summary (Last 24 hours) at 02/09/17 0750 Last data filed at 02/08/17 2102  Gross per 24 hour  Intake           592.63 ml  Output              500 ml  Net            92.63 ml   Filed Weights   02/08/17 0931 02/09/17 0448  Weight: 99.8 kg (220 lb) 102.3 kg (225 lb 8.5 oz)    Telemetry    NSR, no significant arrhythmia - Personally Reviewed  ECG    NSR with age-indeterminate anterolateral infarct - Personally  Reviewed  Physical Exam  Alert, oriented male GEN: No acute distress.   Neck: No JVD Cardiac: RRR, no murmurs, rubs, or gallops.  Respiratory: Clear to auscultation bilaterally. GI: Soft, nontender, non-distended  MS: No edema; No deformity. Right forearm with mild swelling and tenderness, bruising. Radial pulse 2+. No firm hematoma. Neuro:  Nonfocal  Psych: Normal affect   Labs    Chemistry Recent Labs Lab 02/06/17 1130 02/09/17 0316  NA 143 140  K 4.4 4.0  CL 101 107  CO2 25 28  GLUCOSE 114* 124*  BUN 15 12  CREATININE 1.02 1.22  CALCIUM 9.2 8.2*  GFRNONAA 86 >60  GFRAA 99 >60  ANIONGAP  --  5     Hematology Recent Labs Lab 02/06/17 1130 02/09/17 0316  WBC 10.9* 7.8  RBC 5.41 4.54  HGB 16.7 13.7  HCT 48.4 41.8  MCV 90 92.1  MCH 30.9 30.2  MCHC 34.5 32.8  RDW 13.4 13.3  PLT 190  135*    Cardiac EnzymesNo results for input(s): TROPONINI in the last 168 hours. No results for input(s): TROPIPOC in the last 168 hours.   BNPNo results for input(s): BNP, PROBNP in the last 168 hours.   DDimer No results for input(s): DDIMER in the last 168 hours.   Radiology    No results found.  Cardiac Studies   Cath: Conclusion     Ost RCA to Prox RCA lesion, 0 %stenosed.  Dist RCA lesion, 75 %stenosed.  Prox LAD to Mid LAD lesion, 20 %stenosed.  Ost 1st Mrg to 1st Mrg lesion, 40 %stenosed.  A STENT SYNERGY DES 3X28 drug eluting stent was successfully placed.  Mid RCA lesion, 90 %stenosed.  Post intervention, there is a 0% residual stenosis.   1. Severe calcific RCA stenosis treated successfully with PCI (implantation of a 3.0x28 mm Synergy DES) 2. Patent LAD stent with mild in-stent restenosis 3. Patent LCx stent with mild in-stent restenosis  Recommend: continue DAPT long-term as tolerated (minimum 6 months without interruption)      Patient Profile     51 y.o. male with age-advanced CAD, CKD s/p renal transplant, presented for outpatient  cath/PCI with symptoms of progressive exertional angina, CCS Class 3  Assessment & Plan    1. CAD with exertional angina: now s/p PCI of the RCA with a DES platform. Tolerating long-term ASA and plavix. Continue risk reduction efforts - on appropriate medical therapy.  2. Chronic systolic heart failure, NYHA II symptoms: continue losartan, bystolic, isosorbide. LVEDP elevated at cath yesterday.   3. CKD II: s/p renal transplant. Continue same Rx. Creatinine stable today. Follow-up as planned with nephrology.   4. Hyperlipidemia: continue high-intensity statin drug  Dispo: home this morning. FU Dr Johnsie Cancel as planned.  For questions or updates, please contact [place] Please consult [email or web address] for contact info under Cardiology/STEMI.      Deatra James, MD  02/09/2017, 7:50 AM

## 2017-02-10 ENCOUNTER — Encounter: Payer: Self-pay | Admitting: Family Medicine

## 2017-02-11 DIAGNOSIS — G8929 Other chronic pain: Secondary | ICD-10-CM

## 2017-02-11 DIAGNOSIS — R519 Headache, unspecified: Secondary | ICD-10-CM

## 2017-02-11 HISTORY — DX: Other chronic pain: G89.29

## 2017-02-11 HISTORY — DX: Headache, unspecified: R51.9

## 2017-02-11 MED FILL — Lidocaine HCl Local Inj 2%: INTRAMUSCULAR | Qty: 10 | Status: AC

## 2017-02-12 ENCOUNTER — Telehealth (HOSPITAL_COMMUNITY): Payer: Self-pay

## 2017-02-12 NOTE — Telephone Encounter (Signed)
Verified [employer]. $35 Co-payment, no deductible, Out of pocket amount is $1,500/$1,500 has been met, no co-insurance, and no pre-authorization is required. Passport/reference # [id number]

## 2017-02-15 ENCOUNTER — Telehealth: Payer: Self-pay

## 2017-02-15 ENCOUNTER — Other Ambulatory Visit: Payer: Self-pay

## 2017-02-15 DIAGNOSIS — K219 Gastro-esophageal reflux disease without esophagitis: Secondary | ICD-10-CM

## 2017-02-15 DIAGNOSIS — Z1211 Encounter for screening for malignant neoplasm of colon: Secondary | ICD-10-CM

## 2017-02-15 DIAGNOSIS — K2270 Barrett's esophagus without dysplasia: Secondary | ICD-10-CM

## 2017-02-15 NOTE — Telephone Encounter (Signed)
-----   Message from Algernon Huxley, RN sent at 10/31/2016  1:52 PM EDT ----- Regarding: ECL Pt needs ECL after November when he turns 51 at the hospital.

## 2017-02-15 NOTE — Telephone Encounter (Signed)
Left message for pt to call back. Hosp dates for Dr. Hilarie Fredrickson 04/12/17, 05/03/17.

## 2017-02-15 NOTE — Telephone Encounter (Signed)
Pt scheduled for previsit 04/09/17@8am , ECL scheduled at [facility] 05/03/17@8 :30am. Pt is on Plavix but just had cath and stent placed on 02/08/17. Are you ok doing procedure with pt on Plavix? Please advise.

## 2017-02-22 ENCOUNTER — Other Ambulatory Visit: Payer: Managed Care, Other (non HMO)

## 2017-02-25 ENCOUNTER — Encounter: Payer: Self-pay | Admitting: Family Medicine

## 2017-02-25 NOTE — Progress Notes (Signed)
CARDIOLOGY OFFICE NOTE  Date:  02/26/2017    [name] Date of Birth: May 22, 1965 Medical Record #[id number]  PCP:  [name], MD  Cardiologist:  Johnsie Cancel    Chief Complaint  Patient presents with  . Coronary Artery Disease    Post cath visit - seen for Dr. Johnsie Cancel    History of Present Illness: Roy Dyer is a 51 y.o. male who presents today for a post cath visit. Seen for Dr. Johnsie Cancel.   He has a history of lupus, CAD - status post MI 2001 with remote stents to LCX and RCA & PCI in 2012 in setting of anterior MI with BMS to the LAD. Other issues include ICD due to low EF, hypertension, hyperlipidemia, CHF, OSA, GERD, CKD and renal transplant 2014.    After his 2012 MI the patient had a low EF which remain < 35% at 90 days post revascularization and thus an ICD was placed by [name] 11/20/10. He had a large mural apical thrombus at the time and was anticoagulated and subsequently the thrombus resolved and the Coumadin was stopped. Last echocardiogram on 04/23/2016 showed EF 30%.   The patient had a hip replacement in 07/2010 and had worse renal function and ended up with renal transplant at [facility]. He sees every 6 months, last was in 10/2016, and Dr. Moshe Cipro every 4 months. His niece was the donor.  Seen as a work in just a few weeks ago - having chest pain and shortness of breath - referred on for cardiac cath - see below.   Comes in today. Here alone. He feels like he is doing well. No more chest pain. Clearly improved from this standpoint.  Breathing is fine. He is more concerned about his cath site. Has had a fair amount of bruising of the right arm with soreness. His shoulder hurts as well - maybe from how he was positioned. He says the cath lab staff had issues with controlling bleeding. It is improving. Not red, hot or swollen. He is putting heat on his shoulder and ice on the bruise with good results.   Planning on having a colonoscopy in December - understands  that this will more than likely need to be postponed. His head is hurting - worse due to the Imdur - asking if ok to stop.  Typically has better BP control. Seeing PCP tomorrow with labs and prefers to just be stuck one time.   Past Medical History:  Diagnosis Date  . AICD (automatic cardioverter/defibrillator) present    Dr. Paschal Dopp follows remotely-yrly checks, Dr. Jonne Ply  . Avascular necrosis of bone of hip ([place]) 2010 surg   Left hip arthroplasty: from chronic systemic steroids taken for his Lupus  . Barrett's esophagus   . Blood transfusion without reported diagnosis 2010, 2012  . CAD (coronary artery disease)    a. stents RCA/Circ 2001 b. BMS to LAD 07/2010  c. 01/2017: s/p DES to RCA.   . Cataract    left eye  . CHF (congestive heart failure) ([place])   . Chronic headaches    Dr. [name] 06/2016: suspects tension HA's.  CT brain was considered (MRI was not approved by insurer) but ended up not being needed as of 09/2016.  His neurontin was increased to 600 mg bid but this caused hyperkalemia so he d/c'd this med.  HA's improved with decre caffeine and incre water.   Also got referred for eval for possible OSA.  Renal MD  suspicous that his polycyt may be cause HAs..  . Chronic headaches 02/2017   As of 01/2017, HA's no better, neurologist ordered CT.  ESR normal at that time.  . Chronic renal insufficiency, stage II (mild)    GFR 65 ml/min 07/19/15 at local renal f/u (Cr 1.29).  GFR 64 ml/min (cr 1.3) at local renal f/u 01/16/17.  [name] GERD (gastroesophageal reflux disease)    Hx of esoph stricture and dilatation.  +Barrett's esophagus+  . Gout    s/p renal transplant he was weened off of his allopurinol.  . Hiatal hernia   . 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 History of renal transplant 11/10/2012   10/2012 [facility]   . Hyperlipidemia   . Hypertension   . implantable cardiac defibrillator single chamber     Medtronic (due to low EF)  . Ischemic cardiomyopathy    Chronic systolic dysfunction--  86/7619 EF 30-35%.   Single chamber ICD 11/20/10 ([name])  . Left ventricular thrombus 2012   Re-eval 02/2011 showed thrombus RESOLVED, so coumadin was d/c'd (was on it for 70mo  . Lupus   . OSA (obstructive sleep apnea) 2018   Eval by Dr. [name]9/2018-- plan for unattended home sleep test.  . Osteoporosis   . Post-transplant erythrocytosis    improved with ARB (valsartan)    Past Surgical History:  Procedure Laterality Date  . AV FISTULA PLACEMENT  03/28/2011   Procedure: ARTERIOVENOUS (AV) FISTULA CREATION;  Surgeon: TRosetta Posner MD;  [place] [facility]  Service: Vascular;  Laterality: Left;  Creation of Left radiocephallic cimino fistula  . AV FISTULA PLACEMENT  04/27/2011   Procedure: ARTERIOVENOUS (AV) FISTULA CREATION;  Surgeon: TRosetta Posner MD;  [facility] [facility]  Service: Vascular;  Laterality: Left;  left basilic vein transposition  . CARDIAC CATHETERIZATION     08/2010  . CARDIOVASCULAR STRESS TEST  07/2010; 03/2014   03/2014 showed large old infarct and EF 30-35% but no ischemia  . ESOPHAGOGASTRODUODENOSCOPY  02/2009   Done by Dr. SFuller Planfor hematemesis; esophagitis found.  Repeat recommended 09/2016, at which time he will also likely get his initial screening colonoscopy.  . ESOPHAGOGASTRODUODENOSCOPY (EGD) WITH PROPOFOL N/A 09/29/2015   REPEAT EGD RECOMMENDED 09/2016.  Barrett's esoph + mild chronic gastritis.  H pylori NEG. Procedure: ESOPHAGOGASTRODUODENOSCOPY (EGD) WITH PROPOFOL;  Surgeon: JJerene Bears MD;  Location: [facility];  Service: Gastroenterology;  Laterality: N/A;  . INSERT / REPLACE / REMOVE PACEMAKER     medtronic        dr nFrances Nickels   [place]   icd only  . KIDNEY TRANSPLANT  10/2012   [facility] nephrologist--Dr. MBlair Heys . LEFT HEART CATH AND CORONARY ANGIOGRAPHY N/A 02/08/2017   PCA and DES to mRCA--needs DAPT for at least 6 mo.   Procedure: LEFT HEART CATH AND CORONARY  ANGIOGRAPHY;  Surgeon: CSherren Mocha MD;  [facility];  Service: Cardiovascular;  Laterality: N/A;  . PARTIAL HIP ARTHROPLASTY Left 2010   left  . RENAL BIOPSY    . stents     05-2010 and 2- in 2010 and 1 in 2018.  .Marland KitchenTOTAL HIP ARTHROPLASTY  07/09/2011   Procedure: TOTAL HIP ARTHROPLASTY;  Surgeon: FKerin Salen MD;  [place] [facility]  Service: Orthopedics;  Laterality: Right;  . TYMPANOSTOMY TUBE PLACEMENT  51yrs old  . [place]  12/2010; 02/2014;08/2014  02/2014 EF still 25-30%, increased PA pressures.  2016 EF 30-35%, sept/inf hypokinesis, mod tricusp regurg     Medications: Current Meds  Medication Sig  . acetaminophen (TYLENOL) 500 MG tablet Take 1,000 mg by mouth every 6 (six) hours as needed for headache (pain).   [name] allopurinol (ZYLOPRIM) 100 MG tablet Take 1 tablet (100 mg total) by mouth daily.  [name] aspirin EC 81 MG tablet Take 81 mg by mouth daily.  [name] atorvastatin (LIPITOR) 80 MG tablet TAKE 1 TABLET BY MOUTH DAILY (Patient taking differently: TAKE 1 TABLET BY MOUTH DAILY AT NIGHT.)  . BYSTOLIC 5 MG tablet TAKE 1 TABLET BY MOUTH DAILY  . Cholecalciferol (VITAMIN D-3) 5000 units TABS Take 1 tablet by mouth 4 (four) times a week. Sunday, Monday, Wednesday, Friday morning  . clopidogrel (PLAVIX) 75 MG tablet TAKE 1 TABLET BY MOUTH DAILY  . DEXILANT 60 MG capsule TAKE 1 CAPSULE BY MOUTH DAILY BEFORE BREAKFAST  . losartan (COZAAR) 25 MG tablet Take 25 mg by mouth at bedtime.   . mycophenolate (CELLCEPT) 250 MG capsule Take 3 capsules (750 mg total) by mouth 2 (two) times daily.  . niacin (NIASPAN) 500 MG CR tablet TAKE 2 TABLETS BY MOUTH AT BEDTIME  . nitroGLYCERIN (NITROSTAT) 0.4 MG SL tablet USE 1 TAB UNDER TONGUE EVERY 5 MINUTES UP TO 3 DOSES AS NEEDED (Patient taking differently: Place 0.4 mg under the tongue every 5 (five) minutes x 3 doses as needed for chest pain. USE 1 TAB UNDER TONGUE EVERY 5 MINUTES UP TO 3 DOSES AS NEEDED)  . predniSONE  (DELTASONE) 5 MG tablet TAKE 1 TABLET BY MOUTH DAILY  . ranitidine (ZANTAC) 150 MG tablet TAKE 1 TABLET BY MOUTH AT BEDTIME  . tacrolimus (PROGRAF) 0.5 MG capsule Take 0.5-1 mg by mouth See admin instructions. Take 1 capsule (0.5 mg) by mouth daily at bedtime; take 2 capsules (1 mg)Monday, Wednesday, Friday, & Sunday mornings, take 1 capsule (0.5 mg) Tuesday, Thursday, Saturday mornings  . zolpidem (AMBIEN) 5 MG tablet Take 5 mg by mouth at bedtime as needed for sleep.     Allergies: Allergies  Allergen Reactions  . Triptans Palpitations    Reaction to Maxalt    Social History: The patient  reports that he quit smoking about 6 years ago. His smoking use included Cigarettes. He has a 15.00 pack-year smoking history. He has never used smokeless tobacco. He reports that he does not drink alcohol or use drugs.   Family History: The patient's family history includes Diabetes in his mother; Heart attack in his father and paternal grandfather; Heart attack (age of onset: 31) in his sister; Heart disease in his paternal aunt, paternal uncle, and sister; Hyperlipidemia in his father and sister; Hypertension in his father and sister; Kidney failure in his mother; Lupus in his mother; Stroke in his mother.   Review of Systems: Please see the history of present illness.   Otherwise, the review of systems is positive for none.   All other systems are reviewed and negative.   Physical Exam: VS:  BP (!) 142/80 (BP Location: Left Arm, Patient Position: Sitting, Cuff Size: Normal)   Pulse 73   Ht 5' 7" (1.702 m)   Wt 226 lb 12.8 oz (102.9 kg)   SpO2 97%   BMI 35.52 kg/m  .  BMI Body mass index is 35.52 kg/m.  Wt Readings from Last 3 Encounters:  02/26/17 226 lb 12.8 oz (102.9 kg)  02/09/17 225 lb 8.5 oz (  102.3 kg)  02/06/17 222 lb 12.8 oz (101.1 kg)   BP 140/80 by me.   General: Pleasant. Well developed, well nourished and in no acute distress.   HEENT: Normal.  Neck: Supple, no JVD, carotid  bruits, or masses noted.  Cardiac: Regular rate and rhythm. No murmurs, rubs, or gallops. No edema.  Respiratory:  Lungs are clear to auscultation bilaterally with normal work of breathing.  GI: Soft and nontender.  MS: No deformity or atrophy. Gait and ROM intact.  Skin: Warm and dry. Color is normal.  Neuro:  Strength and sensation are intact and no gross focal deficits noted.  Psych: Alert, appropriate and with normal affect.   LABORATORY DATA:  EKG:  EKG is not ordered today.  Lab Results  Component Value Date   WBC 7.8 02/09/2017   HGB 13.7 02/09/2017   HCT 41.8 02/09/2017   PLT 135 (L) 02/09/2017   GLUCOSE 124 (H) 02/09/2017   CHOL 127 03/22/2015   TRIG 70.0 03/22/2015   HDL 60.50 03/22/2015   LDLCALC 53 03/22/2015   ALT 15 (L) 07/17/2016   AST 20 07/17/2016   NA 140 02/09/2017   K 4.0 02/09/2017   CL 107 02/09/2017   CREATININE 1.22 02/09/2017   BUN 12 02/09/2017   CO2 28 02/09/2017   TSH 0.52 09/03/2013   PSA 1.71 10/23/2010   INR 1.1 02/06/2017   HGBA1C 5.6 04/23/2011     BNP (last 3 results)  Recent Labs  07/15/16 2005  BNP 379.2*    ProBNP (last 3 results) No results for input(s): PROBNP in the last 8760 hours.   Other Studies Reviewed Today:  LEFT HEART CATH AND CORONARY ANGIOGRAPHY 01/2017  Conclusion     Ost RCA to Prox RCA lesion, 0 %stenosed.  Dist RCA lesion, 75 %stenosed.  Prox LAD to Mid LAD lesion, 20 %stenosed.  Ost 1st Mrg to 1st Mrg lesion, 40 %stenosed.  A STENT SYNERGY DES 3X28 drug eluting stent was successfully placed.  Mid RCA lesion, 90 %stenosed.  Post intervention, there is a 0% residual stenosis.   1. Severe calcific RCA stenosis treated successfully with PCI (implantation of a 3.0x28 mm Synergy DES) 2. Patent LAD stent with mild in-stent restenosis 3. Patent LCx stent with mild in-stent restenosis  Recommend: continue DAPT long-term as tolerated (minimum 6 months without interruption)    Echocardiogram  04/23/2016 Study Conclusions  - Left ventricle: The cavity size was moderately dilated. Wall thickness was increased in a pattern of mild LVH. The estimated ejection fraction was 30%. Left ventricular diastolic function parameters were normal. - Left atrium: The atrium was severely dilated. - Atrial septum: No defect or patent foramen ovale was identified. - Pulmonary arteries: PA peak pressure: 55 mm Hg (S).   Assessment/Plan:   1. CAD with prior MI x 2 as well as prior PCIs - recent recurrent chest pain/angina - s/p cardiac cath with PCI with DES to the RCA - remains on DAPT - noted that this should not be interrupted for minimum of 6 months.  Clinically he is doing well. Recheck lab tomorrow with PCP - BMET and CBC. I think his arm will improve with time - he does work with computers and may have some underlying carpal tunnel issues that have been exacerbated. The bruising is improving. Great radial pulse to the right hand noted. Stopping Imdur due to headache.   2. Ischemic cardomyopathy:  EF 30% by last echo in 04/2016. Has ICD in place.  3. Underlying ICD - followed by EP - seeing [name] later this month.   4. CKD with prior renal transplant   5. HLD - on statin therapy.     Current medicines are reviewed with the patient today.  The patient does not have concerns regarding medicines other than what has been noted above.  The following changes have been made:  See above.  Labs/ tests ordered today include:   No orders of the defined types were placed in this encounter.    Disposition:   FU with Dr. Johnsie Cancel in about 3 months.   Patient is agreeable to this plan and will call if any problems develop in the interim.   SignedTruitt Merle, NP  02/26/2017 2:40 PM  [facility] [address] [place], [place]  [address] Phone: [phone] Fax: [phone]

## 2017-02-26 ENCOUNTER — Ambulatory Visit (INDEPENDENT_AMBULATORY_CARE_PROVIDER_SITE_OTHER): Payer: Managed Care, Other (non HMO) | Admitting: Nurse Practitioner

## 2017-02-26 ENCOUNTER — Encounter: Payer: Self-pay | Admitting: Nurse Practitioner

## 2017-02-26 VITALS — BP 142/80 | HR 73 | Ht 67.0 in | Wt 226.8 lb

## 2017-02-26 DIAGNOSIS — Z940 Kidney transplant status: Secondary | ICD-10-CM | POA: Diagnosis not present

## 2017-02-26 DIAGNOSIS — Z95810 Presence of automatic (implantable) cardiac defibrillator: Secondary | ICD-10-CM | POA: Diagnosis not present

## 2017-02-26 DIAGNOSIS — Z955 Presence of coronary angioplasty implant and graft: Secondary | ICD-10-CM | POA: Diagnosis not present

## 2017-02-26 DIAGNOSIS — I255 Ischemic cardiomyopathy: Secondary | ICD-10-CM | POA: Diagnosis not present

## 2017-02-26 NOTE — Patient Instructions (Addendum)
We will be checking the following labs today - NONE  Ask your PCP for "BMET and CBC" for [place] please.    Medication Instructions:    Continue with your current medicines. BUT  Ok to stop Imdur    Testing/Procedures To Be Arranged:  N/A  Follow-Up:   See Dr. Johnsie Cancel in about 3 months.   See [name] as planned    Other Special Instructions:   N/A    If you need a refill on your cardiac medications before your next appointment, please call your pharmacy.   Call the [place] office at [phone] if you have any questions, problems or concerns.

## 2017-02-27 ENCOUNTER — Encounter: Payer: Self-pay | Admitting: Family Medicine

## 2017-02-27 ENCOUNTER — Telehealth (HOSPITAL_COMMUNITY): Payer: Self-pay

## 2017-02-27 ENCOUNTER — Ambulatory Visit (INDEPENDENT_AMBULATORY_CARE_PROVIDER_SITE_OTHER): Payer: Managed Care, Other (non HMO) | Admitting: Family Medicine

## 2017-02-27 VITALS — BP 112/54 | HR 64 | Temp 98.3°F | Resp 16 | Ht 67.0 in | Wt 224.2 lb

## 2017-02-27 DIAGNOSIS — G44039 Episodic paroxysmal hemicrania, not intractable: Secondary | ICD-10-CM | POA: Diagnosis not present

## 2017-02-27 DIAGNOSIS — Z940 Kidney transplant status: Secondary | ICD-10-CM

## 2017-02-27 DIAGNOSIS — I2510 Atherosclerotic heart disease of native coronary artery without angina pectoris: Secondary | ICD-10-CM

## 2017-02-27 DIAGNOSIS — I10 Essential (primary) hypertension: Secondary | ICD-10-CM | POA: Diagnosis not present

## 2017-02-27 DIAGNOSIS — M329 Systemic lupus erythematosus, unspecified: Secondary | ICD-10-CM

## 2017-02-27 DIAGNOSIS — N182 Chronic kidney disease, stage 2 (mild): Secondary | ICD-10-CM | POA: Diagnosis not present

## 2017-02-27 DIAGNOSIS — D696 Thrombocytopenia, unspecified: Secondary | ICD-10-CM | POA: Diagnosis not present

## 2017-02-27 DIAGNOSIS — K2100 Gastro-esophageal reflux disease with esophagitis, without bleeding: Secondary | ICD-10-CM

## 2017-02-27 DIAGNOSIS — E7800 Pure hypercholesterolemia, unspecified: Secondary | ICD-10-CM | POA: Diagnosis not present

## 2017-02-27 LAB — CBC WITH DIFFERENTIAL/PLATELET
BASOS PCT: 0.6 % (ref 0.0–3.0)
Basophils Absolute: 0.1 10*3/uL (ref 0.0–0.1)
EOS ABS: 0.4 10*3/uL (ref 0.0–0.7)
Eosinophils Relative: 4.5 % (ref 0.0–5.0)
HCT: 50.6 % (ref 39.0–52.0)
Hemoglobin: 16.7 g/dL (ref 13.0–17.0)
Lymphocytes Relative: 22.4 % (ref 12.0–46.0)
Lymphs Abs: 1.8 10*3/uL (ref 0.7–4.0)
MCHC: 33 g/dL (ref 30.0–36.0)
MCV: 94 fl (ref 78.0–100.0)
MONO ABS: 0.7 10*3/uL (ref 0.1–1.0)
Monocytes Relative: 8.4 % (ref 3.0–12.0)
NEUTROS ABS: 5.3 10*3/uL (ref 1.4–7.7)
Neutrophils Relative %: 64.1 % (ref 43.0–77.0)
PLATELETS: 183 10*3/uL (ref 150.0–400.0)
RBC: 5.39 Mil/uL (ref 4.22–5.81)
RDW: 14.2 % (ref 11.5–15.5)
WBC: 8.2 10*3/uL (ref 4.0–10.5)

## 2017-02-27 LAB — LIPID PANEL
Cholesterol: 115 mg/dL (ref 0–200)
HDL: 65.3 mg/dL (ref >39.00)
LDL Cholesterol: 35 mg/dL (ref 0–99)
NONHDL: 50.17
Total CHOL/HDL Ratio: 2
Triglycerides: 74 mg/dL (ref 0.0–149.0)
VLDL: 14.8 mg/dL (ref 0.0–40.0)

## 2017-02-27 LAB — BASIC METABOLIC PANEL
BUN: 18 mg/dL (ref 6–23)
CALCIUM: 9.6 mg/dL (ref 8.4–10.5)
CO2: 30 mEq/L (ref 19–32)
Chloride: 102 mEq/L (ref 96–112)
Creatinine, Ser: 1.23 mg/dL (ref 0.40–1.50)
GFR: 66.22 mL/min (ref >60.00)
Glucose, Bld: 120 mg/dL — ABNORMAL HIGH (ref 70–99)
POTASSIUM: 4.5 meq/L (ref 3.5–5.1)
SODIUM: 141 meq/L (ref 135–145)

## 2017-02-27 NOTE — Telephone Encounter (Signed)
I called patient to discuss scheduling for cardiac rehab. Patient declined and stated that he was not interested. Referral closed.

## 2017-02-27 NOTE — Progress Notes (Signed)
OFFICE VISIT  02/27/2017   CC:  Chief Complaint  Patient presents with  . Follow-up    RCI, pt is fasting.    HPI:    Patient is a 51 y.o. Caucasian male who presents for f/u SLE in remission, CRI stage II (s/p renal transplant), HTN, HLD.  Recently had another stent placed and feels no SOB or CP anymore.  HTN: occ home monitoring 120s/70s. HLD: tolerating statin.   CRI: avoids NSAIDS but has to take ASA and plavix the next 6 mo. Pays close attention to hydration.  HA's: these have still been problematic: come and go, right sided, worst area behind R ear. Neuro tried to get CT brain again and insurer denied again but neuro working on over-ride.   Past Medical History:  Diagnosis Date  . AICD (automatic cardioverter/defibrillator) present    Dr. Paschal Dopp follows remotely-yrly checks, Dr. Jonne Ply  . Avascular necrosis of bone of hip ([place]) 2010 surg   Left hip arthroplasty: from chronic systemic steroids taken for his Lupus  . Barrett's esophagus   . Blood transfusion without reported diagnosis 2010, 2012  . CAD (coronary artery disease)    a. stents RCA/Circ 2001 b. BMS to LAD 07/2010  c. 01/2017: s/p DES to RCA.   . Cataract    left eye  . CHF (congestive heart failure) ([place])   . Chronic headaches    Dr. [name] 06/2016: suspects tension HA's.  CT brain was considered (MRI was not approved by insurer) but ended up not being needed as of 09/2016.  His neurontin was increased to 600 mg bid but this caused hyperkalemia so he d/c'd this med.  HA's improved with decre caffeine and incre water.   Also got referred for eval for possible OSA.  Renal MD suspicous that his polycyt may be cause HAs..  . Chronic headaches 02/2017   As of 01/2017, HA's no better, neurologist ordered CT.  ESR normal at that time.  . Chronic renal insufficiency, stage II (mild)    GFR 65 ml/min 07/19/15 at local renal f/u (Cr 1.29).  GFR 64 ml/min (cr 1.3) at local renal f/u 01/16/17.  [name] GERD  (gastroesophageal reflux disease)    Hx of esoph stricture and dilatation.  +Barrett's esophagus+  . Gout    s/p renal transplant he was weened off of his allopurinol.  . Hiatal hernia   . 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 History of renal transplant 11/10/2012   10/2012 [facility]   . Hyperlipidemia   . Hypertension   . implantable cardiac defibrillator single chamber    Medtronic (due to low EF)  . Ischemic cardiomyopathy    Chronic systolic dysfunction--  72/5366 EF 30-35%.   Single chamber ICD 11/20/10 ([name])  . Left ventricular thrombus 2012   Re-eval 02/2011 showed thrombus RESOLVED, so coumadin was d/c'd (was on it for 41mo  . Lupus   . OSA (obstructive sleep apnea) 2018   Eval by Dr. [name]9/2018-- plan for unattended home sleep test.  . Osteoporosis   . Post-transplant erythrocytosis    improved with ARB (valsartan)    Past Surgical History:  Procedure Laterality Date  . AV FISTULA PLACEMENT  03/28/2011   Procedure: ARTERIOVENOUS (AV) FISTULA CREATION;  Surgeon: TRosetta Posner MD;  [place]  Service: Vascular;  Laterality: Left;  Creation of Left radiocephallic cimino fistula  . AV FISTULA PLACEMENT  04/27/2011  Procedure: ARTERIOVENOUS (AV) FISTULA CREATION;  Surgeon: Rosetta Posner, MD;  Location: [place];  Service: Vascular;  Laterality: Left;  left basilic vein transposition  . CARDIAC CATHETERIZATION     08/2010  . CARDIOVASCULAR STRESS TEST  07/2010; 03/2014   03/2014 showed large old infarct and EF 30-35% but no ischemia  . ESOPHAGOGASTRODUODENOSCOPY  02/2009   Done by Dr. Fuller Plan for hematemesis; esophagitis found.  Repeat recommended 09/2016, at which time he will also likely get his initial screening colonoscopy.  . ESOPHAGOGASTRODUODENOSCOPY (EGD) WITH PROPOFOL N/A 09/29/2015   REPEAT EGD RECOMMENDED 09/2016.  Barrett's esoph + mild chronic gastritis.  H pylori NEG. Procedure:  ESOPHAGOGASTRODUODENOSCOPY (EGD) WITH PROPOFOL;  Surgeon: Jerene Bears, MD;  [facility];  Service: Gastroenterology;  Laterality: N/A;  . INSERT / REPLACE / REMOVE PACEMAKER     medtronic        dr [name]    [place]   icd only  . KIDNEY TRANSPLANT  10/2012   [facility] nephrologist--Dr. Blair Heys  . LEFT HEART CATH AND CORONARY ANGIOGRAPHY N/A 02/08/2017   PCA and DES to mRCA--needs DAPT for at least 6 mo.   Procedure: LEFT HEART CATH AND CORONARY ANGIOGRAPHY;  Surgeon: Sherren Mocha, MD;  Location: [place] [facility];  Service: Cardiovascular;  Laterality: N/A;  . PARTIAL HIP ARTHROPLASTY Left 2010   left  . RENAL BIOPSY    . stents     05-2010 and 2- in 2010 and 1 in 2018.  [name] TOTAL HIP ARTHROPLASTY  07/09/2011   Procedure: TOTAL HIP ARTHROPLASTY;  Surgeon: Kerin Salen, MD;  Location: [place];  Service: Orthopedics;  Laterality: Right;  . TYMPANOSTOMY TUBE PLACEMENT  51 yrs old  . US ECHOCARDIOGRAPHY  12/2010; 02/2014;08/2014   02/2014 EF still 25-30%, increased PA pressures.  2016 EF 30-35%, sept/inf hypokinesis, mod tricusp regurg    Outpatient Medications Prior to Visit  Medication Sig Dispense Refill  . acetaminophen (TYLENOL) 500 MG tablet Take 1,000 mg by mouth every 6 (six) hours as needed for headache (pain).     [name] allopurinol (ZYLOPRIM) 100 MG tablet Take 1 tablet (100 mg total) by mouth daily. 90 tablet 3  . aspirin EC 81 MG tablet Take 81 mg by mouth daily.    [name] atorvastatin (LIPITOR) 80 MG tablet TAKE 1 TABLET BY MOUTH DAILY (Patient taking differently: TAKE 1 TABLET BY MOUTH DAILY AT NIGHT.) 90 tablet 3  . BYSTOLIC 5 MG tablet TAKE 1 TABLET BY MOUTH DAILY 90 tablet 3  . Cholecalciferol (VITAMIN D-3) 5000 units TABS Take 1 tablet by mouth 4 (four) times a week. Sunday, Monday, Wednesday, Friday morning    . clopidogrel (PLAVIX) 75 MG tablet TAKE 1 TABLET BY MOUTH DAILY 90 tablet 3  . DEXILANT 60 MG capsule TAKE 1 CAPSULE BY MOUTH DAILY BEFORE BREAKFAST 90 capsule 0   . losartan (COZAAR) 25 MG tablet Take 25 mg by mouth at bedtime.     . mycophenolate (CELLCEPT) 250 MG capsule Take 3 capsules (750 mg total) by mouth 2 (two) times daily. 540 capsule 1  . niacin (NIASPAN) 500 MG CR tablet TAKE 2 TABLETS BY MOUTH AT BEDTIME 180 tablet 3  . nitroGLYCERIN (NITROSTAT) 0.4 MG SL tablet USE 1 TAB UNDER TONGUE EVERY 5 MINUTES UP TO 3 DOSES AS NEEDED (Patient taking differently: Place 0.4 mg under the tongue every 5 (five) minutes x 3 doses as needed for chest pain. USE 1 TAB UNDER TONGUE EVERY 5  MINUTES UP TO 3 DOSES AS NEEDED) 25 tablet 3  . predniSONE (DELTASONE) 5 MG tablet TAKE 1 TABLET BY MOUTH DAILY 90 tablet 3  . ranitidine (ZANTAC) 150 MG tablet TAKE 1 TABLET BY MOUTH AT BEDTIME 90 tablet 0  . tacrolimus (PROGRAF) 0.5 MG capsule Take 0.5-1 mg by mouth See admin instructions. Take 1 capsule (0.5 mg) by mouth daily at bedtime; take 2 capsules (1 mg)Monday, Wednesday, Friday, & Sunday mornings, take 1 capsule (0.5 mg) Tuesday, Thursday, Saturday mornings    . zolpidem (AMBIEN) 5 MG tablet Take 5 mg by mouth at bedtime as needed for sleep.     No facility-administered medications prior to visit.     Allergies  Allergen Reactions  . Triptans Palpitations    Reaction to Maxalt    ROS As per HPI  PE: Blood pressure (!) 112/54, pulse 64, temperature 98.3 F (36.8 C), temperature source Oral, resp. rate 16, height _0  (1.702 m), weight 224 lb 4 oz (101.7 kg), SpO2 99 %. Gen: Alert, well appearing.  Patient is oriented to person, place, time, and situation. AFFECT: pleasant, lucid thought and speech. CV: RRR, no m/r/g.   LUNGS: CTA bilat, nonlabored resps, good aeration in all lung fields. EXT: no clubbing, cyanosis, or edema.  Right forearm with volar aspect ecchymoses, no sign of compartment syndrome.  ROM of elbow and wrist intact. Mild TTP volar forearm on R.  LABS:  Lab Results  Component Value Date   TSH 0.52 09/03/2013   Lab Results   Component Value Date   WBC 8.2 02/27/2017   HGB 16.7 02/27/2017   HCT 50.6 02/27/2017   MCV 94.0 02/27/2017   PLT 183.0 02/27/2017   Lab Results  Component Value Date   CREATININE 1.23 02/27/2017   BUN 18 02/27/2017   NA 141 02/27/2017   K 4.5 02/27/2017   CL 102 02/27/2017   CO2 30 02/27/2017   Lab Results  Component Value Date   ALT 15 (L) 07/17/2016   AST 20 07/17/2016   ALKPHOS 75 07/17/2016   BILITOT 2.2 (H) 07/17/2016   Lab Results  Component Value Date   CHOL 115 02/27/2017   Lab Results  Component Value Date   HDL 65.30 02/27/2017   Lab Results  Component Value Date   LDLCALC 35 02/27/2017   Lab Results  Component Value Date   TRIG 74.0 02/27/2017   Lab Results  Component Value Date   CHOLHDL 2 02/27/2017   Lab Results  Component Value Date   PSA 1.71 10/23/2010   Lab Results  Component Value Date   HGBA1C 5.6 04/23/2011   IMPRESSION AND PLAN:  1) SLE: in remission.  2) HTN: The current medical regimen is effective;  continue present plan and medications. BMET today.  3) HLD: Tolerating statin.  FLP today.  AST/ALT normal 07/2016.  4) CRI stage II, hx of renal transplant: doing well. BMET today, esp since he has not had cr check since he got his cath.  5) GERD: still somewhat bothersome.  Continue PPI qAM and H2 blocker hs.  His EGD and colonoscopy have been postponed until he has been off his DAPT---which will continue at least 6 mo from his last stent placement on 02/08/17.  6) CAD: asymptomatic now since most recent stent 01/19/17.  Continue DAPT, BB, ARB, statin.  7) Atypical HA syndrome: neurology still working on Nutritional therapist approval for pt to get head CT.  8) Mild thrombocytopenia on most recent CBC:  will repeat CBC today.  An After Visit Summary was printed and given to the patient.  FOLLOW UP: Return in about 4 months (around 06/30/2017) for routine chronic illness f/u.  Signed:  Crissie Sickles, MD            02/27/2017

## 2017-02-28 ENCOUNTER — Other Ambulatory Visit (INDEPENDENT_AMBULATORY_CARE_PROVIDER_SITE_OTHER): Payer: Managed Care, Other (non HMO)

## 2017-02-28 DIAGNOSIS — R7301 Impaired fasting glucose: Secondary | ICD-10-CM

## 2017-02-28 LAB — HEMOGLOBIN A1C: HEMOGLOBIN A1C: 5.7 % (ref 4.6–6.5)

## 2017-03-11 ENCOUNTER — Encounter: Payer: Self-pay | Admitting: Internal Medicine

## 2017-03-11 ENCOUNTER — Ambulatory Visit (INDEPENDENT_AMBULATORY_CARE_PROVIDER_SITE_OTHER): Payer: Managed Care, Other (non HMO) | Admitting: Internal Medicine

## 2017-03-11 VITALS — BP 122/72 | HR 71 | Ht 67.0 in | Wt 226.8 lb

## 2017-03-11 DIAGNOSIS — Z955 Presence of coronary angioplasty implant and graft: Secondary | ICD-10-CM

## 2017-03-11 DIAGNOSIS — Z95810 Presence of automatic (implantable) cardiac defibrillator: Secondary | ICD-10-CM

## 2017-03-11 DIAGNOSIS — I255 Ischemic cardiomyopathy: Secondary | ICD-10-CM | POA: Diagnosis not present

## 2017-03-11 DIAGNOSIS — I2583 Coronary atherosclerosis due to lipid rich plaque: Secondary | ICD-10-CM | POA: Diagnosis not present

## 2017-03-11 DIAGNOSIS — I2510 Atherosclerotic heart disease of native coronary artery without angina pectoris: Secondary | ICD-10-CM | POA: Diagnosis not present

## 2017-03-11 LAB — CUP PACEART INCLINIC DEVICE CHECK
Battery Voltage: 2.83 V
Brady Statistic RV Percent Paced: 0.13 %
Date Time Interrogation Session: 20181029160630
HIGH POWER IMPEDANCE MEASURED VALUE: 304 Ohm
HIGH POWER IMPEDANCE MEASURED VALUE: 65 Ohm
HIGH POWER IMPEDANCE MEASURED VALUE: 77 Ohm
Lead Channel Impedance Value: 399 Ohm
Lead Channel Pacing Threshold Amplitude: 1.375 V
Lead Channel Sensing Intrinsic Amplitude: 14.75 mV
Lead Channel Sensing Intrinsic Amplitude: 16.125 mV
Lead Channel Setting Pacing Amplitude: 2.75 V
MDC IDC LEAD IMPLANT DT: 20120709
MDC IDC LEAD LOCATION: 753860
MDC IDC MSMT LEADCHNL RV PACING THRESHOLD PULSEWIDTH: 0.4 ms
MDC IDC PG IMPLANT DT: 20120709
MDC IDC SET LEADCHNL RV PACING PULSEWIDTH: 0.4 ms
MDC IDC SET LEADCHNL RV SENSING SENSITIVITY: 0.3 mV

## 2017-03-11 NOTE — Progress Notes (Signed)
Patient Care Team: [name], MD as PCP - General (Family Medicine) Josue Hector, MD as Consulting Physician (Cardiology) Deboraha Sprang, MD as Consulting Physician (Cardiology) Corliss Parish, MD as Consulting Physician (Nephrology) Early, Arvilla Meres, MD as Consulting Physician (Vascular Surgery) Lowella Bandy, MD as Consulting Physician (Urology) Pyrtle, Lajuan Lines, MD as Consulting Physician (Gastroenterology) Pieter Partridge, DO as Consulting Physician (Neurology) Deneise Lever, MD as Consulting Physician (Pulmonary Disease)   HPI  Roy Dyer is a 51 y.o. male Seen in followup for ICD implanted for primary prevention. He has primarily an ischemic myopathy status post LAD occlusion and bare-metal stenti with stentng of his LAD with prior stenting of the circumflex and RCA.  MRI 7/12 demonstrated EF 20%   Echocardiogram 3/13 EF 35% LVH.   His renal transplant is doing beautifully. Creatinine is 1.2  Interval hospitalization for stenting for unstable angina.  No recurrent chest pain.  No shortness of breath.  No edema       Past Medical History:  Diagnosis Date  . AICD (automatic cardioverter/defibrillator) present    Dr. Paschal Dopp follows remotely-yrly checks, Dr. Jonne Ply  . Avascular necrosis of bone of hip ([place]) 2010 surg   Left hip arthroplasty: from chronic systemic steroids taken for his Lupus  . Barrett's esophagus   . Blood transfusion without reported diagnosis 2010, 2012  . CAD (coronary artery disease)    a. stents RCA/Circ 2001 b. BMS to LAD 07/2010  c. 01/2017: s/p DES to RCA.   . Cataract    left eye  . CHF (congestive heart failure) ([place])   . Chronic headaches    Dr. [name] 06/2016: suspects tension HA's.  CT brain was considered (MRI was not approved by insurer) but ended up not being needed as of 09/2016.  His neurontin was increased to 600 mg bid but this caused hyperkalemia so he d/c'd this med.  HA's improved with decre caffeine and  incre water.   Also got referred for eval for possible OSA.  Renal MD suspicous that his polycyt may be cause HAs..  . Chronic headaches 02/2017   As of 01/2017, HA's no better, neurologist ordered CT.  ESR normal at that time.  . Chronic renal insufficiency, stage II (mild)    GFR 65 ml/min 07/19/15 at local renal f/u (Cr 1.29).  GFR 64 ml/min (cr 1.3) at local renal f/u 01/16/17.  [name] GERD (gastroesophageal reflux disease)    Hx of esoph stricture and dilatation.  +Barrett's esophagus+  . Gout    s/p renal transplant he was weened off of his allopurinol.  . Hiatal hernia   . 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 History of renal transplant 11/10/2012   10/2012 [facility]   . Hyperlipidemia   . Hypertension   . IFG (impaired fasting glucose)   . implantable cardiac defibrillator single chamber    Medtronic (due to low EF)  . Ischemic cardiomyopathy    Chronic systolic dysfunction--  67/5449 EF 30-35%.   Single chamber ICD 11/20/10 ([name])  . Left ventricular thrombus 2012   Re-eval 02/2011 showed thrombus RESOLVED, so coumadin was d/c'd (was on it for 35mo  . Lupus   . OSA (obstructive sleep apnea) 2018   Eval by Dr. [name]9/2018-- plan for unattended home sleep test.  . Osteoporosis   . Post-transplant erythrocytosis    improved with ARB (  valsartan)    Past Surgical History:  Procedure Laterality Date  . AV FISTULA PLACEMENT  03/28/2011   Procedure: ARTERIOVENOUS (AV) FISTULA CREATION;  Surgeon: Rosetta Posner, MD;  Location: [place];  Service: Vascular;  Laterality: Left;  Creation of Left radiocephallic cimino fistula  . AV FISTULA PLACEMENT  04/27/2011   Procedure: ARTERIOVENOUS (AV) FISTULA CREATION;  Surgeon: Rosetta Posner, MD;  Location: [place];  Service: Vascular;  Laterality: Left;  left basilic vein transposition  . CARDIAC CATHETERIZATION     08/2010  . CARDIOVASCULAR STRESS TEST  07/2010; 03/2014   03/2014  showed large old infarct and EF 30-35% but no ischemia  . ESOPHAGOGASTRODUODENOSCOPY  02/2009   Done by Dr. Fuller Plan for hematemesis; esophagitis found.  Repeat recommended 09/2016, at which time he will also likely get his initial screening colonoscopy.  . ESOPHAGOGASTRODUODENOSCOPY (EGD) WITH PROPOFOL N/A 09/29/2015   REPEAT EGD RECOMMENDED 09/2016.  Barrett's esoph + mild chronic gastritis.  H pylori NEG. Procedure: ESOPHAGOGASTRODUODENOSCOPY (EGD) WITH PROPOFOL;  Surgeon: Jerene Bears, MD;  [facility];  Service: Gastroenterology;  Laterality: N/A;  . INSERT / REPLACE / REMOVE PACEMAKER     medtronic        dr [name]    [place]   icd only  . KIDNEY TRANSPLANT  10/2012   [facility] nephrologist--Dr. Blair Heys  . LEFT HEART CATH AND CORONARY ANGIOGRAPHY N/A 02/08/2017   PCA and DES to mRCA--needs DAPT for at least 6 mo.   Procedure: LEFT HEART CATH AND CORONARY ANGIOGRAPHY;  Surgeon: Sherren Mocha, MD;  Location: [place] [facility];  Service: Cardiovascular;  Laterality: N/A;  . PARTIAL HIP ARTHROPLASTY Left 2010   left  . RENAL BIOPSY    . stents     05-2010 and 2- in 2010 and 1 in 2018.  [name] TOTAL HIP ARTHROPLASTY  07/09/2011   Procedure: TOTAL HIP ARTHROPLASTY;  Surgeon: Kerin Salen, MD;  Location: [place];  Service: Orthopedics;  Laterality: Right;  . TYMPANOSTOMY TUBE PLACEMENT  51 yrs old  . US ECHOCARDIOGRAPHY  12/2010; 02/2014;08/2014   02/2014 EF still 25-30%, increased PA pressures.  2016 EF 30-35%, sept/inf hypokinesis, mod tricusp regurg    Current Outpatient Prescriptions  Medication Sig Dispense Refill  . acetaminophen (TYLENOL) 500 MG tablet Take 1,000 mg by mouth every 6 (six) hours as needed for headache (pain).     [name] allopurinol (ZYLOPRIM) 100 MG tablet Take 1 tablet (100 mg total) by mouth daily. 90 tablet 3  . aspirin EC 81 MG tablet Take 81 mg by mouth daily.    [name] atorvastatin (LIPITOR) 80 MG tablet TAKE 1 TABLET BY MOUTH DAILY (Patient taking differently: TAKE 1  TABLET BY MOUTH DAILY AT NIGHT.) 90 tablet 3  . BYSTOLIC 5 MG tablet TAKE 1 TABLET BY MOUTH DAILY 90 tablet 3  . Cholecalciferol (VITAMIN D-3) 5000 units TABS Take 1 tablet by mouth 4 (four) times a week. Sunday, Monday, Wednesday, Friday morning    . clopidogrel (PLAVIX) 75 MG tablet TAKE 1 TABLET BY MOUTH DAILY 90 tablet 3  . DEXILANT 60 MG capsule TAKE 1 CAPSULE BY MOUTH DAILY BEFORE BREAKFAST 90 capsule 0  . losartan (COZAAR) 25 MG tablet Take 25 mg by mouth at bedtime.     . mycophenolate (CELLCEPT) 250 MG capsule Take 3 capsules (750 mg total) by mouth 2 (two) times daily. 540 capsule 1  . niacin (NIASPAN) 500 MG CR tablet TAKE 2 TABLETS BY MOUTH  AT BEDTIME 180 tablet 3  . nitroGLYCERIN (NITROSTAT) 0.4 MG SL tablet USE 1 TAB UNDER TONGUE EVERY 5 MINUTES UP TO 3 DOSES AS NEEDED (Patient taking differently: Place 0.4 mg under the tongue every 5 (five) minutes x 3 doses as needed for chest pain. USE 1 TAB UNDER TONGUE EVERY 5 MINUTES UP TO 3 DOSES AS NEEDED) 25 tablet 3  . predniSONE (DELTASONE) 5 MG tablet TAKE 1 TABLET BY MOUTH DAILY 90 tablet 3  . ranitidine (ZANTAC) 150 MG tablet TAKE 1 TABLET BY MOUTH AT BEDTIME 90 tablet 0  . tacrolimus (PROGRAF) 0.5 MG capsule Take 0.5-1 mg by mouth See admin instructions. Take 1 capsule (0.5 mg) by mouth daily at bedtime; take 2 capsules (1 mg)Monday, Wednesday, Friday, & Sunday mornings, take 1 capsule (0.5 mg) Tuesday, Thursday, Saturday mornings    . zolpidem (AMBIEN) 5 MG tablet Take 5 mg by mouth at bedtime as needed for sleep.     No current facility-administered medications for this visit.     Allergies  Allergen Reactions  . Triptans Palpitations    Reaction to Maxalt    Review of Systems negative except from HPI and PMH  Physical Exam BP 122/72   Pulse 71   Ht 5' 7"  (1.702 m)   Wt 226 lb 12.8 oz (102.9 kg)   SpO2 96%   BMI 35.52 kg/m  Well developed and nourished in no acute distress HENT normal Neck supple with JVP-flat Device  pocket well healed; without hematoma or erythema.  There is no tethering  Clear Regular rate and rhythm, no murmurs or gallops Abd-soft with active BS without hepatomegaly No Clubbing cyanosis edema Skin-warm and dry A & Oriented  Grossly normal sensory and motor function   ECG from 29 September was reviewed.  Sinus rhythm with intervals 22/11/43  Assessment and  Plan  Ischemic cardiomyopathy  Implantable defibrillator Medtronic The patient's device was interrogated.  The information was reviewed. No changes were made in the programming.    Nonsustained ventricular tachycardia  No intercurrent Ventricular tachycardia  Renal transplantation   No interval ICD discharges  No VT  Without symptoms of ischemia  Euvolemic continue current meds

## 2017-03-11 NOTE — Patient Instructions (Signed)
Medication Instructions: Your physician recommends that you continue on your current medications as directed. Please refer to the Current Medication list given to you today.  Labwork: None Ordered  Procedures/Testing: None Ordered  Follow-Up: Your physician wants you to follow-up in: 1 YEAR with Chanetta Marshall, NP.  You will receive a reminder letter in the mail two months in advance. If you don't receive a letter, please call our office to schedule the follow-up appointment.  Remote monitoring is used to monitor your ICD from home. This monitoring reduces the number of office visits required to check your device to one time per year. It allows [place] to keep an eye on the functioning of your device to ensure it is working properly. You are scheduled for a device check from home on 04/11/17. You may send your transmission at any time that day. If you have a wireless device, the transmission will be sent automatically. After your physician reviews your transmission, you will receive a postcard with your next transmission date.   If you need a refill on your cardiac medications before your next appointment, please call your pharmacy.

## 2017-03-21 ENCOUNTER — Telehealth: Payer: Self-pay | Admitting: Internal Medicine

## 2017-03-21 ENCOUNTER — Telehealth: Payer: Self-pay | Admitting: Neurology

## 2017-03-21 NOTE — Telephone Encounter (Signed)
I have spoken to patient to advise that we have cancelled his colonoscopy for 05/03/17 (cancelled with endo central scheduling) as well as his previsit 04/09/17. I asked that he let us know when his cardiologist deems him okay to hold plavix 5 days prior to having a procedure and he states that he has an appointment in January at which time he will discuss this with him and let us know.

## 2017-03-21 NOTE — Telephone Encounter (Signed)
Spent some time on the phone with [employer] and [employer], HST was denied and I sent in an appeal with Christella Scheuermann has overturned and I am waiting for [employer] to give me the auth# pt is aware of this Roy Dyer

## 2017-03-21 NOTE — Telephone Encounter (Signed)
Called [facility], spoke with Mardene Celeste, she said Pt was originally scheduled on 10/12, however had to be cancelled due to no PA from [place], prior CT was denied. Called Pt, advsd him, he states on the [employer], it does not indicate a PA was ever initiated in Oct., he can only see the rqst from May that was denied originally, but was then approved but no one was contacted with that info. Advsd hin I will email the PA department and call him back when I get an answer.

## 2017-03-21 NOTE — Telephone Encounter (Signed)
Patient called regarding a scan that he has been waiting on? He said it has been a couple months and he has not heard anything. He said he thought it had been denied and he is not sure the status. Please Call. Thanks

## 2017-03-21 NOTE — Telephone Encounter (Signed)
It appears he had coronary intervention with drug-eluting stent within the last month Colonoscopy should be canceled for now and rescheduled when it is okay to hold his Plavix for 5 days prior to the procedure This needs to be defer to his cardiologist for guidance\

## 2017-03-22 NOTE — Telephone Encounter (Signed)
Talked to [place] after sending email, she is working on this

## 2017-03-22 NOTE — Telephone Encounter (Signed)
error 

## 2017-04-11 ENCOUNTER — Ambulatory Visit (INDEPENDENT_AMBULATORY_CARE_PROVIDER_SITE_OTHER): Payer: Managed Care, Other (non HMO) | Admitting: *Deleted

## 2017-04-11 DIAGNOSIS — I255 Ischemic cardiomyopathy: Secondary | ICD-10-CM

## 2017-04-11 NOTE — Progress Notes (Signed)
Remote ICD transmission.   

## 2017-04-12 ENCOUNTER — Encounter: Payer: Self-pay | Admitting: Cardiology

## 2017-04-15 ENCOUNTER — Telehealth: Payer: Self-pay | Admitting: Neurology

## 2017-04-15 ENCOUNTER — Telehealth: Payer: Self-pay | Admitting: Internal Medicine

## 2017-04-15 DIAGNOSIS — R519 Headache, unspecified: Secondary | ICD-10-CM

## 2017-04-15 DIAGNOSIS — R51 Headache: Principal | ICD-10-CM

## 2017-04-15 NOTE — Telephone Encounter (Signed)
Spoke with pt.  He is calling about status of getting sleep study approved and scheduled.  [place], looks like you have been working on this.  Can you update pt on this?

## 2017-04-15 NOTE — Telephone Encounter (Signed)
LMTCB

## 2017-04-15 NOTE — Telephone Encounter (Signed)
Patient Lmom to see about having his CT Scan set up. He received a letter from from his [employer] and setting up the Scan. He said he is having more  Headaches. Please Call. Thanks

## 2017-04-16 ENCOUNTER — Telehealth: Payer: Self-pay | Admitting: Internal Medicine

## 2017-04-16 NOTE — Telephone Encounter (Signed)
Called to speak with pt about scheduling his ECL since he has turned 51. Pt had a stent placed last month and will be on blood thinners for 6 mth. He will contact our office when cleared by cardiology for the procedure.

## 2017-04-16 NOTE — Telephone Encounter (Signed)
Pt has been authorized and chantel has scheduled him to do his [place]

## 2017-04-19 ENCOUNTER — Ambulatory Visit: Payer: Managed Care, Other (non HMO) | Admitting: Internal Medicine

## 2017-04-22 LAB — CUP PACEART REMOTE DEVICE CHECK
Brady Statistic RV Percent Paced: 0.15 %
Date Time Interrogation Session: 20181129124416
HIGH POWER IMPEDANCE MEASURED VALUE: 285 Ohm
HIGH POWER IMPEDANCE MEASURED VALUE: 73 Ohm
HighPow Impedance: 56 Ohm
Implantable Lead Model: 6947
Lead Channel Impedance Value: 361 Ohm
Lead Channel Sensing Intrinsic Amplitude: 14.625 mV
Lead Channel Setting Sensing Sensitivity: 0.3 mV
MDC IDC LEAD IMPLANT DT: 20120709
MDC IDC LEAD LOCATION: 753860
MDC IDC MSMT BATTERY VOLTAGE: 2.81 V
MDC IDC MSMT LEADCHNL RV PACING THRESHOLD AMPLITUDE: 1.375 V
MDC IDC MSMT LEADCHNL RV PACING THRESHOLD PULSEWIDTH: 0.4 ms
MDC IDC MSMT LEADCHNL RV SENSING INTR AMPL: 14.625 mV
MDC IDC PG IMPLANT DT: 20120709
MDC IDC SET LEADCHNL RV PACING AMPLITUDE: 2.75 V
MDC IDC SET LEADCHNL RV PACING PULSEWIDTH: 0.4 ms

## 2017-04-24 DIAGNOSIS — G4733 Obstructive sleep apnea (adult) (pediatric): Secondary | ICD-10-CM | POA: Diagnosis not present

## 2017-04-25 DIAGNOSIS — G4733 Obstructive sleep apnea (adult) (pediatric): Secondary | ICD-10-CM | POA: Diagnosis not present

## 2017-04-26 ENCOUNTER — Other Ambulatory Visit: Payer: Self-pay | Admitting: *Deleted

## 2017-04-26 DIAGNOSIS — G4733 Obstructive sleep apnea (adult) (pediatric): Secondary | ICD-10-CM

## 2017-05-03 ENCOUNTER — Encounter (HOSPITAL_COMMUNITY): Payer: Self-pay

## 2017-05-03 ENCOUNTER — Ambulatory Visit (HOSPITAL_COMMUNITY): Admit: 2017-05-03 | Payer: Managed Care, Other (non HMO) | Admitting: Internal Medicine

## 2017-05-03 SURGERY — COLONOSCOPY
Anesthesia: Monitor Anesthesia Care

## 2017-05-10 NOTE — Telephone Encounter (Signed)
Called Pt, LM on VM with [facility] ph#

## 2017-05-14 NOTE — Progress Notes (Signed)
CARDIOLOGY OFFICE NOTE  Date:  05/23/2017    [name] Date of Birth: August 06, 1965 Medical Record #[id number]  PCP:  [name], MD  Cardiologist:  Johnsie Cancel    No chief complaint on file.   History of Present Illness: Roy Dyer is a 52 y.o. male who presents today for f/u ischemic DCM and CAD   He has a history of lupus, CAD - status post MI 2001 with remote stents to LCX and RCA & PCI in 2012 in setting of anterior MI with BMS to the LAD. Other issues include ICD due to low EF, hypertension, hyperlipidemia, CHF, OSA, GERD, CKD and renal transplant 2014.    After his 2012 MI the patient had a low EF which remain < 35% at 90 days post revascularization and thus an ICD was placed by [name] 11/20/10. He had a large mural apical thrombus at the time and was anticoagulated and subsequently the thrombus resolved and the Coumadin was stopped. Last echocardiogram on 04/23/2016 showed EF 30%.   The patient had a hip replacement in 07/2010 and had worse renal function and ended up with renal transplant at [facility]. He sees every 6 months, last was in 10/2016, and Dr. Moshe Cipro every 4 months. His niece was the donor.  Had recurrent angina  02/08/17 with 3.0 x 28 mm Synergy DES placed. With patent LAD and circumflex stents  DAPT long term no interruption for 6 months   Had some SSCP last weekend went to [facility] R/O CXR ok pain lasted 36 hours ? Hiatal hernia No relief with SL nitro x 2 1 hours apart   Past Medical History:  Diagnosis Date  . AICD (automatic cardioverter/defibrillator) present    Dr. Paschal Dopp follows remotely-yrly checks, Dr. Jonne Ply  . Avascular necrosis of bone of hip ([place]) 2010 surg   Left hip arthroplasty: from chronic systemic steroids taken for his Lupus  . Barrett's esophagus   . Blood transfusion without reported diagnosis 2010, 2012  . CAD (coronary artery disease)    a. stents RCA/Circ 2001 b. BMS to LAD 07/2010  c. 01/2017: s/p DES to RCA.     . Cataract    left eye  . CHF (congestive heart failure) ([place])   . Chronic headaches    Dr. [name] 06/2016: suspects tension HA's.  CT brain was considered (MRI was not approved by insurer) but ended up not being needed as of 09/2016.  His neurontin was increased to 600 mg bid but this caused hyperkalemia so he d/c'd this med.  HA's improved with decre caffeine and incre water.   Also got referred for eval for possible OSA.  Renal MD suspicous that his polycyt may be cause HAs..  . Chronic headaches 02/2017   As of 01/2017, HA's no better, neurologist ordered CT.  ESR normal at that time.  . Chronic renal insufficiency, stage II (mild)    GFR 65 ml/min 07/19/15 at local renal f/u (Cr 1.29).  GFR 64 ml/min (cr 1.3) at local renal f/u 01/16/17.  [name] GERD (gastroesophageal reflux disease)    Hx of esoph stricture and dilatation.  +Barrett's esophagus+  . Gout    s/p renal transplant he was weened off of his allopurinol.  . Hiatal hernia   . 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 History of renal transplant 11/10/2012   10/2012 [facility]   . Hyperlipidemia   .  Hypertension   . IFG (impaired fasting glucose)   . implantable cardiac defibrillator single chamber    Medtronic (due to low EF)  . Ischemic cardiomyopathy    Chronic systolic dysfunction--  10/3014 EF 30-35%.   Single chamber ICD 11/20/10 ([name])  . Left ventricular thrombus 2012   Re-eval 02/2011 showed thrombus RESOLVED, so coumadin was d/c'd (was on it for 48mo  . Lupus   . OSA (obstructive sleep apnea) 2018   Eval by Dr. [name]9/2018-- plan for unattended home sleep test.  . Osteoporosis   . Post-transplant erythrocytosis    improved with ARB (valsartan)    Past Surgical History:  Procedure Laterality Date  . AV FISTULA PLACEMENT  03/28/2011   Procedure: ARTERIOVENOUS (AV) FISTULA CREATION;  Surgeon: TRosetta Posner MD;  [facility]  Service: Vascular;   Laterality: Left;  Creation of Left radiocephallic cimino fistula  . AV FISTULA PLACEMENT  04/27/2011   Procedure: ARTERIOVENOUS (AV) FISTULA CREATION;  Surgeon: TRosetta Posner MD;  [place] [facility]  Service: Vascular;  Laterality: Left;  left basilic vein transposition  . CARDIAC CATHETERIZATION     08/2010  . CARDIOVASCULAR STRESS TEST  07/2010; 03/2014   03/2014 showed large old infarct and EF 30-35% but no ischemia  . ESOPHAGOGASTRODUODENOSCOPY  02/2009   Done by Dr. SFuller Planfor hematemesis; esophagitis found.  Repeat recommended 09/2016, at which time he will also likely get his initial screening colonoscopy.  . ESOPHAGOGASTRODUODENOSCOPY (EGD) WITH PROPOFOL N/A 09/29/2015   REPEAT EGD RECOMMENDED 09/2016.  Barrett's esoph + mild chronic gastritis.  H pylori NEG. Procedure: ESOPHAGOGASTRODUODENOSCOPY (EGD) WITH PROPOFOL;  Surgeon: JJerene Bears MD;  Location: [facility];  Service: Gastroenterology;  Laterality: N/A;  . INSERT / REPLACE / REMOVE PACEMAKER     medtronic        dr nFrances Nickels   [place]   icd only  . KIDNEY TRANSPLANT  10/2012   [facility] nephrologist--Dr. MBlair Heys . LEFT HEART CATH AND CORONARY ANGIOGRAPHY N/A 02/08/2017   PCA and DES to mRCA--needs DAPT for at least 6 mo.   Procedure: LEFT HEART CATH AND CORONARY ANGIOGRAPHY;  Surgeon: CSherren Mocha MD;  [facility];  Service: Cardiovascular;  Laterality: N/A;  . PARTIAL HIP ARTHROPLASTY Left 2010   left  . RENAL BIOPSY    . stents     05-2010 and 2- in 2010 and 1 in 2018.  .Marland KitchenTOTAL HIP ARTHROPLASTY  07/09/2011   Procedure: TOTAL HIP ARTHROPLASTY;  Surgeon: FKerin Salen MD;  [place] [facility]  Service: Orthopedics;  Laterality: Right;  . TYMPANOSTOMY TUBE PLACEMENT  52yrs old  . [place]  12/2010; 02/2014;08/2014   02/2014 EF still 25-30%, increased PA pressures.  2016 EF 30-35%, sept/inf hypokinesis, mod tricusp regurg     Medications: Current Meds  Medication Sig  . acetaminophen (TYLENOL)  500 MG tablet Take 1,000 mg by mouth every 6 (six) hours as needed for headache (pain).   .Marland Kitchenallopurinol (ZYLOPRIM) 100 MG tablet Take 1 tablet (100 mg total) by mouth daily.  .Marland Kitchenaspirin EC 81 MG tablet Take 81 mg by mouth daily.  .Marland Kitchenatorvastatin (LIPITOR) 80 MG tablet Take 80 mg by mouth at bedtime.  .Marland KitchenBYSTOLIC 5 MG tablet TAKE 1 TABLET BY MOUTH DAILY  . Cholecalciferol (VITAMIN D-3) 5000 units TABS Take 1 tablet by mouth 4 (four) times a week. Sunday, Monday, Wednesday, Friday morning  . clopidogrel (PLAVIX) 75  MG tablet TAKE 1 TABLET BY MOUTH DAILY  . DEXILANT 60 MG capsule TAKE 1 CAPSULE BY MOUTH DAILY BEFORE BREAKFAST  . losartan (COZAAR) 25 MG tablet Take 25 mg by mouth at bedtime.   . magnesium oxide (MAG-OX) 400 MG tablet Take 400 mg by mouth daily.  . mycophenolate (CELLCEPT) 250 MG capsule Take 3 capsules (750 mg total) by mouth 2 (two) times daily.  . niacin (NIASPAN) 500 MG CR tablet TAKE 2 TABLETS BY MOUTH AT BEDTIME  . nitroGLYCERIN (NITROSTAT) 0.4 MG SL tablet Place 0.4 mg under the tongue every 5 (five) minutes as needed for chest pain (DO NOT EXCEED 3 DOSES).  . predniSONE (DELTASONE) 5 MG tablet TAKE 1 TABLET BY MOUTH DAILY  . ranitidine (ZANTAC) 150 MG tablet TAKE 1 TABLET BY MOUTH AT BEDTIME  . tacrolimus (PROGRAF) 0.5 MG capsule Take 0.5-1 mg by mouth See admin instructions. Take 1 capsule (0.5 mg) by mouth daily at bedtime; take 2 capsules (1 mg)Monday, Wednesday, Friday, & Sunday mornings, take 1 capsule (0.5 mg) Tuesday, Thursday, Saturday mornings  . zolpidem (AMBIEN) 5 MG tablet Take 5 mg by mouth at bedtime as needed for sleep.     Allergies: Allergies  Allergen Reactions  . Triptans Palpitations    Reaction to Maxalt    Social History: The patient  reports that he quit smoking about 6 years ago. His smoking use included cigarettes. He has a 15.00 pack-year smoking history. he has never used smokeless tobacco. He reports that he does not drink alcohol or use  drugs.   Family History: The patient's family history includes Diabetes in his mother; Heart attack in his father and paternal grandfather; Heart attack (age of onset: 89) in his sister; Heart disease in his paternal aunt, paternal uncle, and sister; Hyperlipidemia in his father and sister; Hypertension in his father and sister; Kidney failure in his mother; Lupus in his mother; Stroke in his mother.   Review of Systems: Please see the history of present illness.   Otherwise, the review of systems is positive for none.   All other systems are reviewed and negative.   Physical Exam: VS:  BP 132/82   Pulse 62   Ht 5' 7" (1.702 m)   Wt 232 lb 8 oz (105.5 kg)   SpO2 97%   BMI 36.41 kg/m  .  BMI Body mass index is 36.41 kg/m.  Wt Readings from Last 3 Encounters:  05/23/17 232 lb 8 oz (105.5 kg)  03/11/17 226 lb 12.8 oz (102.9 kg)  02/27/17 224 lb 4 oz (101.7 kg)   BP 132/82   Pulse 62   Ht 5' 7" (1.702 m)   Wt 232 lb 8 oz (105.5 kg)   SpO2 97%   BMI 36.41 kg/m  Affect appropriate Healthy:  appears stated age 29: normal Neck supple with no adenopathy JVP normal no bruits no thyromegaly Lungs clear with no wheezing and good diaphragmatic motion Heart:  S1/S2 no murmur, no rub, gallop or click PMI normal AICD under left clavicle  Abdomen: benighn, BS positve, no tenderness, no AAA no bruit.  No HSM or HJR renal transplant LLQ  Distal pulses intact with no bruits No edema Neuro non-focal Skin warm and dry No muscular weakness    LABORATORY DATA:  EKG:  02/09/17 SR LAD old anterior MI   Lab Results  Component Value Date   WBC 13.4 (H) 05/18/2017   HGB 16.9 05/18/2017   HCT 49.3 05/18/2017   PLT  181 05/18/2017   GLUCOSE 106 (H) 05/18/2017   CHOL 115 02/27/2017   TRIG 74.0 02/27/2017   HDL 65.30 02/27/2017   LDLCALC 35 02/27/2017   ALT 15 (L) 07/17/2016   AST 20 07/17/2016   NA 139 05/18/2017   K 4.3 05/18/2017   CL 103 05/18/2017   CREATININE 1.40 (H)  05/18/2017   BUN 24 (H) 05/18/2017   CO2 28 05/18/2017   TSH 0.52 09/03/2013   PSA 1.71 10/23/2010   INR 1.1 02/06/2017   HGBA1C 5.7 02/28/2017     BNP (last 3 results) Recent Labs    07/15/16 2005  BNP 379.2*    ProBNP (last 3 results) No results for input(s): PROBNP in the last 8760 hours.   Other Studies Reviewed Today:  LEFT HEART CATH AND CORONARY ANGIOGRAPHY 01/2017  Conclusion     Ost RCA to Prox RCA lesion, 0 %stenosed.  Dist RCA lesion, 75 %stenosed.  Prox LAD to Mid LAD lesion, 20 %stenosed.  Ost 1st Mrg to 1st Mrg lesion, 40 %stenosed.  A STENT SYNERGY DES 3X28 drug eluting stent was successfully placed.  Mid RCA lesion, 90 %stenosed.  Post intervention, there is a 0% residual stenosis.   1. Severe calcific RCA stenosis treated successfully with PCI (implantation of a 3.0x28 mm Synergy DES) 2. Patent LAD stent with mild in-stent restenosis 3. Patent LCx stent with mild in-stent restenosis  Recommend: continue DAPT long-term as tolerated (minimum 6 months without interruption)    Echocardiogram 04/23/2016 Study Conclusions  - Left ventricle: The cavity size was moderately dilated. Wall thickness was increased in a pattern of mild LVH. The estimated ejection fraction was 30%. Left ventricular diastolic function parameters were normal. - Left atrium: The atrium was severely dilated. - Atrial septum: No defect or patent foramen ovale was identified. - Pulmonary arteries: PA peak pressure: 55 mm Hg (S).   Assessment/Plan:   1. CAD with prior MI x 2 and ischemic DCM  Stents in all 3 major epicardial vessels most recently to mid RCA September 2018 Imdur stopped due to headaches Recent ER visit for SSCP r/o likely hiatal hernia continue Medical RX   2. Ischemic cardomyopathy:  EF 30% by last echo in 04/2016. Has ICD in place. Follows with [name] Normal function   3. Underlying ICD - followed by EP - seeing [name] later this month.    4. CKD with prior renal transplant F/U [place] Cr 1.23   5. HLD - on statin therapy.     Current medicines are reviewed with the patient today.  The patient does not have concerns regarding medicines other than what has been noted above.  The following changes have been made:  See above.  Labs/ tests ordered today include:   No orders of the defined types were placed in this encounter.    Disposition:   FU with Dr. Johnsie Cancel in about 6 months.   [name]

## 2017-05-17 ENCOUNTER — Ambulatory Visit
Admission: RE | Admit: 2017-05-17 | Discharge: 2017-05-17 | Disposition: A | Discharge status: Home / Self Care | Payer: Managed Care, Other (non HMO) | Source: Ambulatory Visit | Attending: Neurology | Admitting: Neurology

## 2017-05-17 DIAGNOSIS — R51 Headache: Principal | ICD-10-CM

## 2017-05-17 DIAGNOSIS — R519 Headache, unspecified: Secondary | ICD-10-CM

## 2017-05-18 ENCOUNTER — Encounter (HOSPITAL_BASED_OUTPATIENT_CLINIC_OR_DEPARTMENT_OTHER): Payer: Self-pay | Admitting: Emergency Medicine

## 2017-05-18 ENCOUNTER — Emergency Department (HOSPITAL_BASED_OUTPATIENT_CLINIC_OR_DEPARTMENT_OTHER)
Admission: EM | Admit: 2017-05-18 | Discharge: 2017-05-18 | Disposition: A | Discharge status: Home / Self Care | Payer: Managed Care, Other (non HMO) | Attending: Emergency Medicine | Admitting: Emergency Medicine

## 2017-05-18 ENCOUNTER — Other Ambulatory Visit: Payer: Self-pay

## 2017-05-18 ENCOUNTER — Emergency Department (HOSPITAL_BASED_OUTPATIENT_CLINIC_OR_DEPARTMENT_OTHER): Payer: Managed Care, Other (non HMO)

## 2017-05-18 DIAGNOSIS — Z7901 Long term (current) use of anticoagulants: Secondary | ICD-10-CM | POA: Diagnosis not present

## 2017-05-18 DIAGNOSIS — Z87891 Personal history of nicotine dependence: Secondary | ICD-10-CM | POA: Insufficient documentation

## 2017-05-18 DIAGNOSIS — R072 Precordial pain: Secondary | ICD-10-CM | POA: Diagnosis not present

## 2017-05-18 DIAGNOSIS — R0602 Shortness of breath: Secondary | ICD-10-CM | POA: Insufficient documentation

## 2017-05-18 DIAGNOSIS — Z940 Kidney transplant status: Secondary | ICD-10-CM | POA: Insufficient documentation

## 2017-05-18 DIAGNOSIS — N182 Chronic kidney disease, stage 2 (mild): Secondary | ICD-10-CM | POA: Insufficient documentation

## 2017-05-18 DIAGNOSIS — I130 Hypertensive heart and chronic kidney disease with heart failure and stage 1 through stage 4 chronic kidney disease, or unspecified chronic kidney disease: Secondary | ICD-10-CM | POA: Diagnosis not present

## 2017-05-18 DIAGNOSIS — I2510 Atherosclerotic heart disease of native coronary artery without angina pectoris: Secondary | ICD-10-CM | POA: Diagnosis not present

## 2017-05-18 DIAGNOSIS — R079 Chest pain, unspecified: Secondary | ICD-10-CM | POA: Diagnosis present

## 2017-05-18 DIAGNOSIS — Z79899 Other long term (current) drug therapy: Secondary | ICD-10-CM | POA: Insufficient documentation

## 2017-05-18 DIAGNOSIS — I509 Heart failure, unspecified: Secondary | ICD-10-CM | POA: Diagnosis not present

## 2017-05-18 LAB — CBC
HEMATOCRIT: 49.3 % (ref 39.0–52.0)
HEMOGLOBIN: 16.9 g/dL (ref 13.0–17.0)
MCH: 31 pg (ref 26.0–34.0)
MCHC: 34.3 g/dL (ref 30.0–36.0)
MCV: 90.3 fL (ref 78.0–100.0)
Platelets: 181 10*3/uL (ref 150–400)
RBC: 5.46 MIL/uL (ref 4.22–5.81)
RDW: 13.6 % (ref 11.5–15.5)
WBC: 13.4 10*3/uL — AB (ref 4.0–10.5)

## 2017-05-18 LAB — BASIC METABOLIC PANEL
ANION GAP: 8 (ref 5–15)
BUN: 24 mg/dL — ABNORMAL HIGH (ref 6–20)
CO2: 28 mmol/L (ref 22–32)
Calcium: 9.1 mg/dL (ref 8.9–10.3)
Chloride: 103 mmol/L (ref 101–111)
Creatinine, Ser: 1.4 mg/dL — ABNORMAL HIGH (ref 0.61–1.24)
GFR calc Af Amer: >60 mL/min (ref >60)
GFR, EST NON AFRICAN AMERICAN: 57 mL/min — AB (ref >60)
Glucose, Bld: 106 mg/dL — ABNORMAL HIGH (ref 65–99)
POTASSIUM: 4.3 mmol/L (ref 3.5–5.1)
SODIUM: 139 mmol/L (ref 135–145)

## 2017-05-18 LAB — TROPONIN I: Troponin I: 0.03 ng/mL (ref <0.03)

## 2017-05-18 MED ORDER — HYDROCODONE-ACETAMINOPHEN 5-325 MG PO TABS
1.0000 | ORAL_TABLET | Freq: Once | ORAL | Status: AC
  Administered 2017-05-18: 1 via ORAL
  Filled 2017-05-18: qty 1

## 2017-05-18 MED ORDER — ASPIRIN 81 MG PO CHEW
324.0000 mg | CHEWABLE_TABLET | Freq: Once | ORAL | Status: AC
  Administered 2017-05-18: 324 mg via ORAL
  Filled 2017-05-18: qty 4

## 2017-05-18 NOTE — Discharge Instructions (Signed)

## 2017-05-18 NOTE — ED Provider Notes (Signed)
[facility] Provider Note   CSN: [id number] Arrival date & time: 05/18/17  0217     History   Chief Complaint Chief Complaint  Patient presents with  . Chest Pain    HPI Roy Dyer is a 52 y.o. male.  The history is provided by the patient.  Chest Pain   This is a new problem. The current episode started 3 to 5 hours ago. The problem occurs constantly. The problem has been gradually worsening. The pain is moderate. Quality: Tightness. Radiates to: Radiates from right to left. Associated symptoms include nausea and shortness of breath. Pertinent negatives include no fever and no vomiting. He has tried nitroglycerin for the symptoms. The treatment provided no relief.  His past medical history is significant for CAD.   Patient with multiple medical conditions including CAD, renal disease s/p transplant presents with chest pain that started several hours ago at rest. Reports it feels like previous angina, so he took nitroglycerin without relief Reported mild shortness of breath and also felt nausea, he also reports some diaphoresis but this is not a new phenomenon He denies any recent ICD shocks Past Medical History:  Diagnosis Date  . AICD (automatic cardioverter/defibrillator) present    Dr. Paschal Dopp follows remotely-yrly checks, Dr. Jonne Ply  . Avascular necrosis of bone of hip ([place]) 2010 surg   Left hip arthroplasty: from chronic systemic steroids taken for his Lupus  . Barrett's esophagus   . Blood transfusion without reported diagnosis 2010, 2012  . CAD (coronary artery disease)    a. stents RCA/Circ 2001 b. BMS to LAD 07/2010  c. 01/2017: s/p DES to RCA.   . Cataract    left eye  . CHF (congestive heart failure) ([place])   . Chronic headaches    Dr. [name] 06/2016: suspects tension HA's.  CT brain was considered (MRI was not approved by insurer) but ended up not being needed as of 09/2016.  His neurontin was increased to 600 mg bid but this  caused hyperkalemia so he d/c'd this med.  HA's improved with decre caffeine and incre water.   Also got referred for eval for possible OSA.  Renal MD suspicous that his polycyt may be cause HAs..  . Chronic headaches 02/2017   As of 01/2017, HA's no better, neurologist ordered CT.  ESR normal at that time.  . Chronic renal insufficiency, stage II (mild)    GFR 65 ml/min 07/19/15 at local renal f/u (Cr 1.29).  GFR 64 ml/min (cr 1.3) at local renal f/u 01/16/17.  [name] GERD (gastroesophageal reflux disease)    Hx of esoph stricture and dilatation.  +Barrett's esophagus+  . Gout    s/p renal transplant he was weened off of his allopurinol.  . Hiatal hernia   . 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 History of renal transplant 11/10/2012   10/2012 [facility]   . Hyperlipidemia   . Hypertension   . IFG (impaired fasting glucose)   . implantable cardiac defibrillator single chamber    Medtronic (due to low EF)  . Ischemic cardiomyopathy    Chronic systolic dysfunction--  74/1287 EF 30-35%.   Single chamber ICD 11/20/10 ([name])  . Left ventricular thrombus 2012   Re-eval 02/2011 showed thrombus RESOLVED, so coumadin was d/c'd (was on it for 11mo  . Lupus   . OSA (obstructive sleep apnea) 2018   Eval by Dr. [name]9/2018--  plan for unattended home sleep test.  . Osteoporosis   . Post-transplant erythrocytosis    improved with ARB (valsartan)    Patient Active Problem List   Diagnosis Date Noted  . Obstructive sleep apnea 01/18/2017  . Chronic renal insufficiency, stage II (mild)   . Cough with hemoptysis 07/15/2016  . Hemoptysis 07/15/2016  . GERD (gastroesophageal reflux disease) 12/31/2013  . History of renal transplant 09/02/2013  . Coronary artery disease with exertional angina ([place]) 09/12/2011  . Ulnar neuropathy of both upper extremities 07/30/2011  . Raynaud's disease 06/25/2011  . Automatic implantable  cardioverter-defibrillator in situ 03/07/2011  . Erectile dysfunction 10/23/2010  . Mixed hyperlipidemia 08/10/2009  . Essential hypertension, benign 08/10/2009  . ischemic cardiomyopathy status post anterolateral MI 02/01/2009  . LUPUS 02/01/2009    Past Surgical History:  Procedure Laterality Date  . AV FISTULA PLACEMENT  03/28/2011   Procedure: ARTERIOVENOUS (AV) FISTULA CREATION;  Surgeon: Rosetta Posner, MD;  Location: [facility];  Service: Vascular;  Laterality: Left;  Creation of Left radiocephallic cimino fistula  . AV FISTULA PLACEMENT  04/27/2011   Procedure: ARTERIOVENOUS (AV) FISTULA CREATION;  Surgeon: Rosetta Posner, MD;  Location: [facility];  Service: Vascular;  Laterality: Left;  left basilic vein transposition  . CARDIAC CATHETERIZATION     08/2010  . CARDIOVASCULAR STRESS TEST  07/2010; 03/2014   03/2014 showed large old infarct and EF 30-35% but no ischemia  . ESOPHAGOGASTRODUODENOSCOPY  02/2009   Done by Dr. Fuller Plan for hematemesis; esophagitis found.  Repeat recommended 09/2016, at which time he will also likely get his initial screening colonoscopy.  . ESOPHAGOGASTRODUODENOSCOPY (EGD) WITH PROPOFOL N/A 09/29/2015   REPEAT EGD RECOMMENDED 09/2016.  Barrett's esoph + mild chronic gastritis.  H pylori NEG. Procedure: ESOPHAGOGASTRODUODENOSCOPY (EGD) WITH PROPOFOL;  Surgeon: Jerene Bears, MD;  Location: [facility];  Service: Gastroenterology;  Laterality: N/A;  . INSERT / REPLACE / REMOVE PACEMAKER     medtronic        dr [name]    [place]   icd only  . KIDNEY TRANSPLANT  10/2012   [facility] nephrologist--Dr. Blair Heys  . LEFT HEART CATH AND CORONARY ANGIOGRAPHY N/A 02/08/2017   PCA and DES to mRCA--needs DAPT for at least 6 mo.   Procedure: LEFT HEART CATH AND CORONARY ANGIOGRAPHY;  Surgeon: Sherren Mocha, MD;  Location: [facility];  Service: Cardiovascular;  Laterality: N/A;  . PARTIAL HIP ARTHROPLASTY Left 2010   left  . RENAL BIOPSY    . stents     05-2010 and 2- in 2010  and 1 in 2018.  [name] TOTAL HIP ARTHROPLASTY  07/09/2011   Procedure: TOTAL HIP ARTHROPLASTY;  Surgeon: Kerin Salen, MD;  Location: [facility];  Service: Orthopedics;  Laterality: Right;  . TYMPANOSTOMY TUBE PLACEMENT  52 yrs old  . US ECHOCARDIOGRAPHY  12/2010; 02/2014;08/2014   02/2014 EF still 25-30%, increased PA pressures.  2016 EF 30-35%, sept/inf hypokinesis, mod tricusp regurg       Home Medications    Prior to Admission medications   Medication Sig Start Date End Date Taking? Authorizing Provider  acetaminophen (TYLENOL) 500 MG tablet Take 1,000 mg by mouth every 6 (six) hours as needed for headache (pain).     [provider]  allopurinol (ZYLOPRIM) 100 MG tablet Take 1 tablet (100 mg total) by mouth daily. 03/22/16   McGowenAdrian Blackwater, MD  aspirin EC 81 MG tablet Take 81 mg by mouth daily.  [provider]  atorvastatin (LIPITOR) 80 MG tablet TAKE 1 TABLET BY MOUTH DAILY Patient taking differently: TAKE 1 TABLET BY MOUTH DAILY AT NIGHT. 08/16/16   McGowen, [name], MD  BYSTOLIC 5 MG tablet TAKE 1 TABLET BY MOUTH DAILY 08/16/16   McGowen, [name], MD  Cholecalciferol (VITAMIN D-3) 5000 units TABS Take 1 tablet by mouth 4 (four) times a week. Sunday, Monday, Wednesday, Friday morning    [provider]  clopidogrel (PLAVIX) 75 MG tablet TAKE 1 TABLET BY MOUTH DAILY 08/16/16   McGowen, [name], MD  DEXILANT 60 MG capsule TAKE 1 CAPSULE BY MOUTH DAILY BEFORE BREAKFAST 09/25/16   Pyrtle, Lajuan Lines, MD  losartan (COZAAR) 25 MG tablet Take 25 mg by mouth at bedtime.  01/03/17   [provider]  mycophenolate (CELLCEPT) 250 MG capsule Take 3 capsules (750 mg total) by mouth 2 (two) times daily. 12/23/16   McGowen, [name], MD  niacin (NIASPAN) 500 MG CR tablet TAKE 2 TABLETS BY MOUTH AT BEDTIME 08/16/16   McGowen, [name], MD  nitroGLYCERIN (NITROSTAT) 0.4 MG SL tablet USE 1 TAB UNDER TONGUE EVERY 5 MINUTES UP TO 3 DOSES AS NEEDED Patient taking differently: Place 0.4  mg under the tongue every 5 (five) minutes x 3 doses as needed for chest pain. USE 1 TAB UNDER TONGUE EVERY 5 MINUTES UP TO 3 DOSES AS NEEDED 02/06/17   Daune Perch, NP  predniSONE (DELTASONE) 5 MG tablet TAKE 1 TABLET BY MOUTH DAILY 10/24/16   McGowen, [name], MD  ranitidine (ZANTAC) 150 MG tablet TAKE 1 TABLET BY MOUTH AT BEDTIME 09/25/16   Pyrtle, Lajuan Lines, MD  tacrolimus (PROGRAF) 0.5 MG capsule Take 0.5-1 mg by mouth See admin instructions. Take 1 capsule (0.5 mg) by mouth daily at bedtime; take 2 capsules (1 mg)Monday, Wednesday, Friday, & Sunday mornings, take 1 capsule (0.5 mg) Tuesday, Thursday, Saturday mornings    [provider]  zolpidem (AMBIEN) 5 MG tablet Take 5 mg by mouth at bedtime as needed for sleep.    [provider]    Family History Family History  Problem Relation Age of Onset  . Kidney failure Mother   . Lupus Mother   . Stroke Mother   . Diabetes Mother        type 2  . Heart attack Father        X 7  . Hypertension Father   . Hyperlipidemia Father   . Heart attack Sister 52       X 1  . Hyperlipidemia Sister   . Hypertension Sister   . Heart disease Sister   . Heart attack Paternal Grandfather   . Heart disease Paternal Aunt   . Heart disease Paternal Uncle   . Colon cancer Neg Hx   . Esophageal cancer Neg Hx   . Stomach cancer Neg Hx   . Rectal cancer Neg Hx     Social History Social History   Tobacco Use  . Smoking status: Former Smoker    Packs/day: 0.50    Years: 30.00    Pack years: 15.00    Types: Cigarettes    Last attempt to quit: 08/02/2010    Years since quitting: 6.7  . Smokeless tobacco: Never Used  Substance Use Topics  . Alcohol use: No    Alcohol/week: 0.0 oz  . Drug use: No     Allergies   Triptans   Review of Systems Review of Systems  Constitutional:  Negative for fever.  Respiratory: Positive for shortness of breath.   Cardiovascular: Positive for chest pain.  Gastrointestinal: Positive for  nausea. Negative for vomiting.  Neurological: Negative for syncope.  All other systems reviewed and are negative.    Physical Exam Updated Vital Signs BP 131/77   Pulse 76   Temp (!) 97.3 F (36.3 C) (Oral)   Resp 16   SpO2 97%   Physical Exam CONSTITUTIONAL: Well developed/well nourished, chronically ill-appearing HEAD: Normocephalic/atraumatic EYES: EOMI/PERRL ENMT: Mucous membranes moist NECK: supple no meningeal signs SPINE/BACK:entire spine nontender CV: S1/S2 noted, no murmurs/rubs/gallops noted LUNGS: Lungs are clear to auscultation bilaterally, no apparent distress ABDOMEN: soft, nontender, no rebound or guarding, bowel sounds noted throughout abdomen, obese GU:no cva tenderness NEURO: Pt is awake/alert/appropriate, moves all extremitiesx4.  No facial droop.   EXTREMITIES: pulses normal/equal, full ROM no calf tenderness or edema, no erythema to lower extremities SKIN: warm, color normal PSYCH: no abnormalities of mood noted, alert and oriented to situation   ED Treatments / Results  Labs (all labs ordered are listed, but only abnormal results are displayed) Labs Reviewed  BASIC METABOLIC PANEL - Abnormal; Notable for the following components:      Result Value   Glucose, Bld 106 (*)    BUN 24 (*)    Creatinine, Ser 1.40 (*)    GFR calc non Af Amer 57 (*)    All other components within normal limits  CBC - Abnormal; Notable for the following components:   WBC 13.4 (*)    All other components within normal limits  TROPONIN I - Abnormal; Notable for the following components:   Troponin I 0.03 (*)    All other components within normal limits  TROPONIN I    EKG  EKG Interpretation  Date/Time:  Saturday May 18 2017 02:21:48 EST Ventricular Rate:  77 PR Interval:    QRS Duration: 103 QT Interval:  390 QTC Calculation: 442 R Axis:   -55 Text Interpretation:  Sinus rhythm Prolonged PR interval Left anterior fascicular block Anterolateral infarct, age  indeterminate Confirmed by Ripley Fraise [phone]) on 05/18/2017 2:31:24 AM       Radiology Dg Chest 2 View  Result Date: 05/18/2017 CLINICAL DATA:  Chest pain in the upper chest going up into the neck. Previous smoker. Heart disease with multiple stents. EXAM: CHEST  2 VIEW COMPARISON:  08/13/2016 FINDINGS: Cardiac pacemaker. Cardiac enlargement without vascular congestion or edema. No consolidation in the lungs. No blunting of costophrenic angles. No pneumothorax. Mediastinal contours appear intact. Degenerative changes in the spine. IMPRESSION: Cardiac enlargement.  No evidence of active pulmonary disease. Electronically Signed   By: Lucienne Capers M.D.   On: 05/18/2017 02:44   Ct Head Wo Contrast  Result Date: 05/17/2017 CLINICAL DATA:  Headaches for 2 years, worsening. Pain is behind the right eye. EXAM: CT HEAD WITHOUT CONTRAST TECHNIQUE: Contiguous axial images were obtained from the base of the skull through the vertex without intravenous contrast. COMPARISON:  None. FINDINGS: Brain: No evidence of infarction, hemorrhage, hydrocephalus, extra-axial collection or mass lesion/mass effect. Vascular: No hyperdense vessel or unexpected calcification. Skull: Normal. Negative for fracture or focal lesion. Sinuses/Orbits: Negative IMPRESSION: Negative head CT.  No explanation for headache. Electronically Signed   By: Monte Fantasia M.D.   On: 05/17/2017 13:30    Procedures Procedures (including critical care time)  Medications Ordered in ED Medications  HYDROcodone-acetaminophen (NORCO/VICODIN) 5-325 MG per tablet 1 tablet (not administered)  aspirin chewable tablet 324 mg (  324 mg Oral Given 05/18/17 0341)  HYDROcodone-acetaminophen (NORCO/VICODIN) 5-325 MG per tablet 1 tablet (1 tablet Oral Given 05/18/17 0342)     Initial Impression / Assessment and Plan / ED Course  I have reviewed the triage vital signs and the nursing notes.  Pertinent labs & imaging results that were available during  my care of the patient were reviewed by me and considered in my medical decision making (see chart for details).     4:12 AM Chronically ill with multiple medical conditions, including CAD with a cardiac cath last fall when he required stenting to the RCA. He reports this episode did feel similar to prior angina Troponin tonight and in the past always appears to be at 0.03 We will continue to monitor, will give aspirin, will control pain, will recheck troponin at 4:30 AM 5:50 AM Repeat troponin is improved, patient reports mild improvement is able to sit back down. Patient reports this all feels like  previous angina, but reports it usually does not last this long He denies any sharp pleuritic pain.  He also denies any ripping tearing sensation into his back I Have very low suspicion for pulmonary embolism or dissection Chest x-ray does not show any significant change from prior I spoke to on-call cardiology fellow, we discussed case including labs, EKG, and recent cardiac cath He felt that at minimum patient to have a repeat troponin in the next 6-12 hours I presented this to the patient, and after discussion of risk and benefits he elected to go home He does report that he is feeling improved, and he feels less likely angina and may be GI related Advised it is very difficult to rule out ACS due to his multiple medical conditions however he elects to go home He has follow-up with cardiology on January 10.  We discussed strict return precautions  Final Clinical Impressions(s) / ED Diagnoses   Final diagnoses:  Precordial pain    ED Discharge Orders    None       Ripley Fraise, MD 05/18/17 [phone]

## 2017-05-18 NOTE — ED Notes (Signed)
No changes, sitting in chair for comfort, remains on monitor.

## 2017-05-18 NOTE — ED Notes (Signed)
Troponin result 0.03 given to Dr Christy Gentles and Primary RN Wille Glaser.

## 2017-05-18 NOTE — ED Triage Notes (Signed)
Pt presents with c/o chest pain and shortness of breath fo rthe past couple hours . Pt took 2 SL nitg at home without without and states pain is now in his neck and worse in his chest and harder to breath.

## 2017-05-18 NOTE — ED Notes (Addendum)
To xray at this time. Alert, NAD, calm, interactive, resps e/u, speaking in clear complete sentences, no dyspnea noted, skin W&D, VSS, c/o CP, started with only R shoulder, gradually radiated across chest to include L shoulder, also some sob, nausea, "always sweats", onset ~1 hour after eating, tried ntg sl and tums at home w/o relief, (denies: dizziness or visual changes). Fistula noted to L upper arm. H/o HD. Now transplant pt (seen by [facility]). Card MD is Dr. Johnsie Cancel. Family at [facility].

## 2017-05-18 NOTE — ED Notes (Addendum)
Back from xray, no changes, alert, NAD, calm, interactive. Wife at [facility].

## 2017-05-18 NOTE — ED Notes (Signed)
No changes, denies questions or needs,VSS, steady gait, "feel better", rates throat and chest discomfort 2/10, still worse when lying down, declining transfer to [facility] ED for continued work up. Out with family.

## 2017-05-20 ENCOUNTER — Telehealth: Payer: Self-pay

## 2017-05-20 NOTE — Telephone Encounter (Signed)
-----   Message from Pieter Partridge, DO sent at 05/17/2017  2:37 PM EST ----- CT of head looks normal.  It does not reveal anything concerning.

## 2017-05-20 NOTE — Telephone Encounter (Signed)
Called Pt, LM on VM advising CT results and to call with any questions

## 2017-05-23 ENCOUNTER — Encounter: Payer: Self-pay | Admitting: Family Medicine

## 2017-05-23 ENCOUNTER — Encounter: Payer: Self-pay | Admitting: Cardiovascular Disease

## 2017-05-23 ENCOUNTER — Ambulatory Visit: Payer: Managed Care, Other (non HMO) | Admitting: Cardiovascular Disease

## 2017-05-23 ENCOUNTER — Ambulatory Visit: Payer: Managed Care, Other (non HMO) | Admitting: Family Medicine

## 2017-05-23 VITALS — BP 135/73 | HR 66 | Temp 97.6°F | Resp 16 | Ht 67.0 in | Wt 235.8 lb

## 2017-05-23 VITALS — BP 132/82 | HR 62 | Ht 67.0 in | Wt 232.5 lb

## 2017-05-23 DIAGNOSIS — M7742 Metatarsalgia, left foot: Secondary | ICD-10-CM | POA: Diagnosis not present

## 2017-05-23 DIAGNOSIS — G5762 Lesion of plantar nerve, left lower limb: Secondary | ICD-10-CM | POA: Diagnosis not present

## 2017-05-23 DIAGNOSIS — I2510 Atherosclerotic heart disease of native coronary artery without angina pectoris: Secondary | ICD-10-CM | POA: Diagnosis not present

## 2017-05-23 DIAGNOSIS — M79672 Pain in left foot: Secondary | ICD-10-CM | POA: Diagnosis not present

## 2017-05-23 NOTE — Patient Instructions (Addendum)
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 Wear a metatarsal pad in left shoe.  Morton Neuralgia Morton neuralgia is a type of foot pain in the area closest to your toes. This area is sometimes called the ball of your foot. Morton neuralgia occurs when a branch of a nerve in your foot (digital nerve) becomes compressed. When this happens over a long period of time, the nerve can thicken (neuroma) and cause pain. This usually occurs between the third and fourth toe. Morton neuralgia can come and go but may get worse over time. What are the causes? Your digital nerve can become compressed and stretched at a point where it passes under a thick band of tissue that connects your toes (intermetatarsal ligament). Morton neuralgia can be caused by mild repetitive damage in this area. This type of damage can result from:  Activities such as running or jumping.  Wearing shoes that are too tight.  What increases the risk? You may be at risk for Morton neuralgia if you:  Are male.  Wear high heels.  Wear shoes that are narrow or tight.  Participate in activities that stretch your toes. These include: ? Running. ? [place]. ? Long-distance walking.  What are the signs or symptoms? The first symptom of Morton neuralgia is pain that spreads from the ball of your foot to your toes. It may feel like you are walking on a marble. Pain usually gets worse with walking and goes away at night. Other symptoms may include numbness and cramping of your toes. How is this diagnosed? Your health care provider will do a physical exam. When doing the exam, your health care provider may:  Squeeze your foot just behind your toe.  Ask you to move your toes to check for pain.  You may also have tests on your foot to confirm the diagnosis. These may include:  An X-ray.  An MRI.  How is this treated? Treatment for Morton neuralgia may be as simple as changing  the kind of shoes you wear. Other treatments may include:  Wearing a supportive pad (orthosis) under the front of your foot. This lifts your toe bones and takes pressure off the nerve.  Getting injections of numbing medicine and anti-inflammatory medicine (steroid) in the nerve.  Having surgery to remove part of the thickened nerve.  Follow these instructions at home:  Take medicine only as directed by your health care provider.  Wear soft-soled shoes with a wide toe area.  Stop activities that may be causing pain.  Elevate your foot when resting.  Massage your foot.  Apply ice to the injured area: ? Put ice in a plastic bag. ? Place a towel between your skin and the bag. ? Leave the ice on for 20 minutes, 2-3 times a day.  Keep all follow-up visits as directed by your health care provider. This is important. Contact a health care provider if:  Home care instructions are not helping you get better.  Your symptoms change or get worse. This information is not intended to replace advice given to you by your health care provider. Make sure you discuss any questions you have with your health care provider. Document Released: 08/06/2000 Document Revised: 10/06/2015 Document Reviewed: 07/01/2013 Elsevier Interactive Patient Education  2018 [employer]. n Pharmacologist your orthopedist for further evaluation and management.

## 2017-05-23 NOTE — Progress Notes (Signed)
OFFICE VISIT  05/23/2017   CC:  Chief Complaint  Patient presents with  . Foot Pain    left x 2 weeks   HPI:    Patient is a 52 y.o. Caucasian male who presents for left foot pain.  Onset of left foot pain on distal region of foot, plantar surface more than dorsal surface--over metatarsal heads about 2 weeks ago.  Does not hurt much when not weight bearing, but with wt bearing it hurts much more, says he feels like foot is swollen some esp towards end of day, "like I'm stepping on something every step).  No redness.  No signif swelling. Ice no help.  Tylenol helps minimally. No recent shoe changes prior to onset of pain.  Has not tried any shoe inserts. No fevers or malaise. No preceding trauma or injury recalled.  Says it does not feel like gouty arthritis that he has had in the past.  Past Medical History:  Diagnosis Date  . AICD (automatic cardioverter/defibrillator) present    Dr. Paschal Dopp follows remotely-yrly checks, Dr. Jonne Ply  . Avascular necrosis of bone of hip ([place]) 2010 surg   Left hip arthroplasty: from chronic systemic steroids taken for his Lupus  . Barrett's esophagus   . Blood transfusion without reported diagnosis 2010, 2012  . CAD (coronary artery disease)    a. stents RCA/Circ 2001 b. BMS to LAD 07/2010  c. 01/2017: s/p DES to RCA.   . Cataract    left eye  . CHF (congestive heart failure) ([place])   . Chronic headaches    Dr. [name] 06/2016: suspects tension HA's.  CT brain was considered (MRI was not approved by insurer) but ended up not being needed as of 09/2016.  His neurontin was increased to 600 mg bid but this caused hyperkalemia so he d/c'd this med.  HA's improved with decre caffeine and incre water.   Also got referred for eval for possible OSA.  Renal MD suspicous that his polycyt may be cause HAs..  . Chronic headaches 02/2017   As of 01/2017, HA's no better, neurologist ordered CT.  ESR normal at that time.  . Chronic renal insufficiency, stage  II (mild)    GFR 65 ml/min 07/19/15 at local renal f/u (Cr 1.29).  GFR 64 ml/min (cr 1.3) at local renal f/u 01/16/17.  [name] GERD (gastroesophageal reflux disease)    Hx of esoph stricture and dilatation.  +Barrett's esophagus+  . Gout    s/p renal transplant he was weened off of his allopurinol.  . Hiatal hernia   . 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 History of renal transplant 11/10/2012   10/2012 [facility]   . Hyperlipidemia   . Hypertension   . IFG (impaired fasting glucose)   . implantable cardiac defibrillator single chamber    Medtronic (due to low EF)  . Ischemic cardiomyopathy    Chronic systolic dysfunction--  12/1446 EF 30-35%.   Single chamber ICD 11/20/10 ([name])  . Left ventricular thrombus 2012   Re-eval 02/2011 showed thrombus RESOLVED, so coumadin was d/c'd (was on it for 70mo  . Lupus   . OSA (obstructive sleep apnea) 2018   Eval by Dr. [name]9/2018-- plan for unattended home sleep test.  . Osteoporosis   . Post-transplant erythrocytosis    improved with ARB (valsartan)    Past Surgical History:  Procedure Laterality Date  . AV FISTULA PLACEMENT  03/28/2011  Procedure: ARTERIOVENOUS (AV) FISTULA CREATION;  Surgeon: Rosetta Posner, MD;  Location: [place];  Service: Vascular;  Laterality: Left;  Creation of Left radiocephallic cimino fistula  . AV FISTULA PLACEMENT  04/27/2011   Procedure: ARTERIOVENOUS (AV) FISTULA CREATION;  Surgeon: Rosetta Posner, MD;  Location: [place];  Service: Vascular;  Laterality: Left;  left basilic vein transposition  . CARDIAC CATHETERIZATION     08/2010  . CARDIOVASCULAR STRESS TEST  07/2010; 03/2014   03/2014 showed large old infarct and EF 30-35% but no ischemia  . ESOPHAGOGASTRODUODENOSCOPY  02/2009   Done by Dr. Fuller Plan for hematemesis; esophagitis found.  Repeat recommended 09/2016, at which time he will also likely get his initial screening colonoscopy.  .  ESOPHAGOGASTRODUODENOSCOPY (EGD) WITH PROPOFOL N/A 09/29/2015   REPEAT EGD RECOMMENDED 09/2016.  Barrett's esoph + mild chronic gastritis.  H pylori NEG. Procedure: ESOPHAGOGASTRODUODENOSCOPY (EGD) WITH PROPOFOL;  Surgeon: Jerene Bears, MD;  [facility];  Service: Gastroenterology;  Laterality: N/A;  . INSERT / REPLACE / REMOVE PACEMAKER     medtronic        dr [name]    [place]   icd only  . KIDNEY TRANSPLANT  10/2012   [facility] nephrologist--Dr. Blair Heys  . LEFT HEART CATH AND CORONARY ANGIOGRAPHY N/A 02/08/2017   PCA and DES to mRCA--needs DAPT for at least 6 mo.   Procedure: LEFT HEART CATH AND CORONARY ANGIOGRAPHY;  Surgeon: Sherren Mocha, MD;  Location: [place] [facility];  Service: Cardiovascular;  Laterality: N/A;  . PARTIAL HIP ARTHROPLASTY Left 2010   left  . RENAL BIOPSY    . stents     05-2010 and 2- in 2010 and 1 in 2018.  [name] TOTAL HIP ARTHROPLASTY  07/09/2011   Procedure: TOTAL HIP ARTHROPLASTY;  Surgeon: Kerin Salen, MD;  Location: [place];  Service: Orthopedics;  Laterality: Right;  . TYMPANOSTOMY TUBE PLACEMENT  52 yrs old  . US ECHOCARDIOGRAPHY  12/2010; 02/2014;08/2014   02/2014 EF still 25-30%, increased PA pressures.  2016 EF 30-35%, sept/inf hypokinesis, mod tricusp regurg    Outpatient Medications Prior to Visit  Medication Sig Dispense Refill  . acetaminophen (TYLENOL) 500 MG tablet Take 1,000 mg by mouth every 6 (six) hours as needed for headache (pain).     [name] allopurinol (ZYLOPRIM) 100 MG tablet Take 1 tablet (100 mg total) by mouth daily. 90 tablet 3  . aspirin EC 81 MG tablet Take 81 mg by mouth daily.    [name] atorvastatin (LIPITOR) 80 MG tablet Take 80 mg by mouth at bedtime.    [name] BYSTOLIC 5 MG tablet TAKE 1 TABLET BY MOUTH DAILY 90 tablet 3  . Cholecalciferol (VITAMIN D-3) 5000 units TABS Take 1 tablet by mouth 4 (four) times a week. Sunday, Monday, Wednesday, Friday morning    . clopidogrel (PLAVIX) 75 MG tablet TAKE 1 TABLET BY MOUTH DAILY 90 tablet 3   . DEXILANT 60 MG capsule TAKE 1 CAPSULE BY MOUTH DAILY BEFORE BREAKFAST 90 capsule 0  . losartan (COZAAR) 25 MG tablet Take 25 mg by mouth at bedtime.     . magnesium oxide (MAG-OX) 400 MG tablet Take 400 mg by mouth daily.    . mycophenolate (CELLCEPT) 250 MG capsule Take 3 capsules (750 mg total) by mouth 2 (two) times daily. 540 capsule 1  . niacin (NIASPAN) 500 MG CR tablet TAKE 2 TABLETS BY MOUTH AT BEDTIME 180 tablet 3  . nitroGLYCERIN (NITROSTAT) 0.4 MG SL tablet  Place 0.4 mg under the tongue every 5 (five) minutes as needed for chest pain (DO NOT EXCEED 3 DOSES).    . predniSONE (DELTASONE) 5 MG tablet TAKE 1 TABLET BY MOUTH DAILY 90 tablet 3  . ranitidine (ZANTAC) 150 MG tablet TAKE 1 TABLET BY MOUTH AT BEDTIME 90 tablet 0  . tacrolimus (PROGRAF) 0.5 MG capsule Take 0.5-1 mg by mouth See admin instructions. Take 1 capsule (0.5 mg) by mouth daily at bedtime; take 2 capsules (1 mg)Monday, Wednesday, Friday, & Sunday mornings, take 1 capsule (0.5 mg) Tuesday, Thursday, Saturday mornings    . zolpidem (AMBIEN) 5 MG tablet Take 5 mg by mouth at bedtime as needed for sleep.     No facility-administered medications prior to visit.     Allergies  Allergen Reactions  . Triptans Palpitations    Reaction to Maxalt    ROS As per HPI  PE: Blood pressure 135/73, pulse 66, temperature 97.6 F (36.4 C), temperature source Oral, resp. rate 16, height 5' 7"  (1.702 m), weight 235 lb 12 oz (106.9 kg), SpO2 97 %. Gen: Alert, well appearing.  Patient is oriented to person, place, time, and situation. AFFECT: pleasant, lucid thought and speech. Left foot: no swelling.  NV intact.  No LL or foot edema. No erythema.  He has not TTP in ankle or proximal foot, but has significant TTP over 2nd and 3rd metatarsal heads---top and bottom area of foot.  Also signif pain in space between the 2nd and 3rd metatarsal heads into web space.  No TTP, redness, or swelling in phalanges.  ROM of ankle, foot, toes  intact.  LABS:  Lab Results  Component Value Date   LABURIC 4.1 10/23/2010     Chemistry      Component Value Date/Time   NA 139 05/18/2017 0220   NA 143 02/06/2017 1130   K 4.3 05/18/2017 0220   CL 103 05/18/2017 0220   CO2 28 05/18/2017 0220   BUN 24 (H) 05/18/2017 0220   BUN 15 02/06/2017 1130   CREATININE 1.40 (H) 05/18/2017 0220      Component Value Date/Time   CALCIUM 9.1 05/18/2017 0220   CALCIUM 9.1 04/23/2011 0820   ALKPHOS 75 07/17/2016 0542   AST 20 07/17/2016 0542   ALT 15 (L) 07/17/2016 0542   BILITOT 2.2 (H) 07/17/2016 0542     GFR 57 ml/min 05/18/17  IMPRESSION AND PLAN:  Acute left foot pain: suspect metatarsalgia and possibly morton's neuroma. Will check foot x-ray to r/o stress fracture given his pretty significant level of tenderness. Discussed metatarsal pad use, wide toed shoes recommended, tylenol, relative rest. I encouraged pt to contact his orthopedist for appt for further eval/mgmt of this problem.  An After Visit Summary was printed and given to the patient.  FOLLOW UP: Return for as needed.  Signed:  Crissie Sickles, MD           05/23/2017

## 2017-05-23 NOTE — Patient Instructions (Addendum)
Medication Instructions:  Your physician recommends that you continue on your current medications as directed. Please refer to the Current Medication list given to you today.  Labwork: NONE  Testing/Procedures: NONE  Follow-Up: Your physician wants you to follow-up in: 3 months with Dr. Nishan.   If you need a refill on your cardiac medications before your next appointment, please call your pharmacy.    

## 2017-05-24 ENCOUNTER — Ambulatory Visit (HOSPITAL_BASED_OUTPATIENT_CLINIC_OR_DEPARTMENT_OTHER)
Admission: RE | Admit: 2017-05-24 | Discharge: 2017-05-24 | Disposition: A | Discharge status: Home / Self Care | Payer: Managed Care, Other (non HMO) | Source: Ambulatory Visit | Attending: Family Medicine | Admitting: Family Medicine

## 2017-05-24 DIAGNOSIS — M79675 Pain in left toe(s): Secondary | ICD-10-CM | POA: Insufficient documentation

## 2017-05-24 DIAGNOSIS — M7742 Metatarsalgia, left foot: Secondary | ICD-10-CM | POA: Insufficient documentation

## 2017-06-04 ENCOUNTER — Encounter: Payer: Self-pay | Admitting: Internal Medicine

## 2017-06-04 ENCOUNTER — Ambulatory Visit: Payer: Managed Care, Other (non HMO) | Admitting: Internal Medicine

## 2017-06-04 VITALS — BP 108/72 | HR 73 | Ht 67.0 in | Wt 232.8 lb

## 2017-06-04 DIAGNOSIS — G4733 Obstructive sleep apnea (adult) (pediatric): Secondary | ICD-10-CM

## 2017-06-04 NOTE — Progress Notes (Signed)
[birth year]51 year old male former smoker for sleep evaluation. Referred courtesy of [facility]/ Dr Coy Saunas to have headaches; ? Sleep Apnea. Never had sleep study.  Medical problem list includes Lupus, HBP, CAD, MI/CM/CHF/AICD, renal transplant, GERD/Barrett's He complains of recurrent headaches, afternoons and especially on waking in the morning, getting more frequent. He is not aware of heavy snoring but wife tells him of witnessed apneas. Fairly heavy caffeine user through the day. No sleep medication. Sleeps with mouth open Bedtime between 10 and 11 PM estimating 15-20 minutes sleep latency, waking twice before up at 7 AM. He is not aware of parasomnias including movement disturbance. Sleeps with head of bed propped up and wedge pillow because of reflux Has had episodes of aspiration pneumonia in the past but not aware of ongoing lung disease. ENT surgery limited to myringotomy tubes. Has Ambien left over from use after kidney transplant, not used in years. 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 Epworth score 5/24  06/04/17-52 year old male former smoker followed for OSA, complicated by Lupus, HBP, CAD, MI/CM/CHF/AICD, renal transplant, GERD/Barrett's HST 04/24/17-AHI 15/hour, desaturation to 84%, body weight 221 pounds ----Pt had HST on 04-20-17, requesting results and recommendations We reviewed his sleep study, medical concerns and treatment options and answer questions. He is choosing to start CPAP-auto 5-20.  ROS-see HPI   + = Positive Constitutional:    weight loss, night sweats, fevers, chills, fatigue, lassitude. HEENT:    + headaches, difficulty swallowing, tooth/dental problems, sore throat,       sneezing, itching, ear ache, nasal congestion, post nasal drip, snoring CV:    chest pain, orthopnea, PND, swelling in lower extremities, anasarca,                                                      dizziness, palpitations Resp:   shortness of breath with exertion or at  rest.                productive cough,   non-productive cough, coughing up of blood.              change in color of mucus.  wheezing.   Skin:    rash or lesions. GI:  No-   heartburn, indigestion, abdominal pain, nausea, vomiting, diarrhea,                 change in bowel habits, loss of appetite GU: dysuria, change in color of urine, no urgency or frequency.   flank pain. MS:   joint pain, stiffness, decreased range of motion, back pain. Neuro-     nothing unusual Psych:  change in mood or affect.  depression or anxiety.   memory loss.  OBJ- Physical Exam General- Alert, Oriented, Affect-appropriate, Distress- none acute, + overweight Skin- rash-none, lesions- none, excoriation- none Lymphadenopathy- none Head- atraumatic            Eyes- Gross vision intact, PERRLA, conjunctivae and secretions clear            Ears- Hearing, canals-normal            Nose- Clear, no-Septal dev, mucus, polyps, erosion, perforation             Throat- Mallampati II , mucosa clear , drainage- none, tonsils- atrophic Neck- flexible , trachea midline, no stridor , thyroid nl, carotid no  bruit Chest - symmetrical excursion , unlabored           Heart/CV- RRR , no murmur , no gallop  , no rub, nl s1 s2                           - JVD- none , edema- none, stasis changes- none, varices- none           Lung- clear to P&A, wheeze- none, cough- none , dullness-none, rub- none           Chest wall- + AICD R Abd-  Br/ Gen/ Rectal- Not done, not indicated Extrem- cyanosis- none, clubbing, none, atrophy- none, strength- nl Neuro- grossly intact to observation

## 2017-06-04 NOTE — Patient Instructions (Signed)
Order- new DME new CPAP auto 5-20, mask of choice, humidifier, supplies, AirView  Dx OSA  Please call as needed

## 2017-06-04 NOTE — Assessment & Plan Note (Signed)
He chooses to start with CPAP, which I think is the better initial therapy given all of his comorbidities.  Appropriate discussion done. Plan-new start CPAP auto 5-20

## 2017-06-05 ENCOUNTER — Encounter: Payer: Self-pay | Admitting: Family Medicine

## 2017-06-26 ENCOUNTER — Encounter: Payer: Self-pay | Admitting: Internal Medicine

## 2017-06-26 NOTE — Telephone Encounter (Signed)
Order was placed to [employer] on 06/04/17. Pt states [employer] has not contacted him. PCC's can you guys check on this.

## 2017-06-26 NOTE — Telephone Encounter (Signed)
I called [employer] & spoke to [facility].  She states they just got approval from [place] she will be calling the pt this evening or tomorrow.  He will probably be set up the end of this week or first of next week.

## 2017-06-30 ENCOUNTER — Other Ambulatory Visit: Payer: Self-pay | Admitting: Family Medicine

## 2017-07-01 ENCOUNTER — Ambulatory Visit: Payer: Managed Care, Other (non HMO) | Admitting: Family Medicine

## 2017-07-01 ENCOUNTER — Encounter: Payer: Self-pay | Admitting: Family Medicine

## 2017-07-01 VITALS — BP 123/78 | HR 67 | Temp 97.7°F | Ht 67.0 in | Wt 227.0 lb

## 2017-07-01 DIAGNOSIS — Z940 Kidney transplant status: Secondary | ICD-10-CM | POA: Diagnosis not present

## 2017-07-01 DIAGNOSIS — I10 Essential (primary) hypertension: Secondary | ICD-10-CM

## 2017-07-01 DIAGNOSIS — M84375D Stress fracture, left foot, subsequent encounter for fracture with routine healing: Secondary | ICD-10-CM

## 2017-07-01 DIAGNOSIS — N182 Chronic kidney disease, stage 2 (mild): Secondary | ICD-10-CM | POA: Diagnosis not present

## 2017-07-01 DIAGNOSIS — K219 Gastro-esophageal reflux disease without esophagitis: Secondary | ICD-10-CM

## 2017-07-01 DIAGNOSIS — E7800 Pure hypercholesterolemia, unspecified: Secondary | ICD-10-CM

## 2017-07-01 NOTE — Telephone Encounter (Signed)
This needs to be re-routed to pt's nephrologist b/c they are the prescribers of this medication [facility] in [place])--thx

## 2017-07-01 NOTE — Progress Notes (Signed)
OFFICE VISIT  07/01/2017   CC:  Chief Complaint  Patient presents with  . Follow-up    HPI:    Patient is a 52 y.o. Caucasian male who presents for 4 mo f/u SLE in remission, CRI stage II (s/p renal transplant), HTN, HLD. Has CAD with stents, on DAPT, ICM w/EF 30%, has ICD.  Since I last saw him he went to ED for CP and ruled out for MI, subsequent dx reflux esoph/hiatal hernia as cause.  I feels fine.  Saw ortho, has stress fracture in left foot--ortho says it is healing fine.  Ortho recommends bone density testing.  Neph:  Has f/u in next couple weeks. Most recent Cr in ED 05/2017 was 1.4.  HTN: all good with home monitoring.  HLD: tolerating statin.    GERD: GERD "under control" but has waxing and waning problems.  Has f/u with GI MD---is supposed to get EGD and colonoscopy but are waiting on him to have 6 mo of uninterrupted DAPT.  OSA: just dx'd and started CPAP last night---so far says it is "brutal to sleep with".   Past Medical History:  Diagnosis Date  . AICD (automatic cardioverter/defibrillator) present    Dr. Paschal Dopp follows remotely-yrly checks, Dr. Jonne Ply  . Avascular necrosis of bone of hip ([place]) 2010 surg   Left hip arthroplasty: from chronic systemic steroids taken for his Lupus  . Barrett's esophagus   . Blood transfusion without reported diagnosis 2010, 2012  . CAD (coronary artery disease)    a. stents RCA/Circ 2001 b. BMS to LAD 07/2010  c. 01/2017: s/p DES to RCA.   . Cataract    left eye  . CHF (congestive heart failure) ([place])   . Chronic headaches    Dr. [name] 06/2016: suspects tension HA's.  CT brain was considered (MRI was not approved by insurer) but ended up not being needed as of 09/2016.  His neurontin was increased to 600 mg bid but this caused hyperkalemia so he d/c'd this med.  HA's improved with decre caffeine and incre water.   Also got referred for eval for possible OSA.  Renal MD suspicous that his polycyt may be cause HAs..  .  Chronic headaches 02/2017   As of 01/2017, HA's no better, neurologist ordered CT.  ESR normal at that time.  . Chronic renal insufficiency, stage II (mild)    GFR 65 ml/min 07/19/15 at local renal f/u (Cr 1.29).  GFR 64 ml/min (cr 1.3) at local renal f/u 01/16/17.  [name] GERD (gastroesophageal reflux disease)    Hx of esoph stricture and dilatation.  +Barrett's esophagus+  . Gout    s/p renal transplant he was weened off of his allopurinol.  . Hiatal hernia   . 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 History of renal transplant 11/10/2012   10/2012 [facility]   . Hyperlipidemia   . Hypertension   . IFG (impaired fasting glucose)   . implantable cardiac defibrillator single chamber    Medtronic (due to low EF)  . Ischemic cardiomyopathy    Chronic systolic dysfunction--  67/2094 EF 30-35%.   Single chamber ICD 11/20/10 ([name])  . Left ventricular thrombus 2012   Re-eval 02/2011 showed thrombus RESOLVED, so coumadin was d/c'd (was on it for 59mo  . Lupus   . OSA on CPAP 2018   Dr. [name]9/2018: +OSA on home sleep studay; CPAP auto titrate 5-20 started 05/2017.  .[name]  Osteoporosis   . Post-transplant erythrocytosis    improved with ARB (valsartan)    Past Surgical History:  Procedure Laterality Date  . AV FISTULA PLACEMENT  03/28/2011   Procedure: ARTERIOVENOUS (AV) FISTULA CREATION;  Surgeon: Rosetta Posner, MD;  Location: [place];  Service: Vascular;  Laterality: Left;  Creation of Left radiocephallic cimino fistula  . AV FISTULA PLACEMENT  04/27/2011   Procedure: ARTERIOVENOUS (AV) FISTULA CREATION;  Surgeon: Rosetta Posner, MD;  Location: [place];  Service: Vascular;  Laterality: Left;  left basilic vein transposition  . CARDIAC CATHETERIZATION     08/2010  . CARDIOVASCULAR STRESS TEST  07/2010; 03/2014   03/2014 showed large old infarct and EF 30-35% but no ischemia  . ESOPHAGOGASTRODUODENOSCOPY  02/2009   Done by Dr. Fuller Plan for  hematemesis; esophagitis found.  Repeat recommended 09/2016, at which time he will also likely get his initial screening colonoscopy.  . ESOPHAGOGASTRODUODENOSCOPY (EGD) WITH PROPOFOL N/A 09/29/2015   REPEAT EGD RECOMMENDED 09/2016.  Barrett's esoph + mild chronic gastritis.  H pylori NEG. Procedure: ESOPHAGOGASTRODUODENOSCOPY (EGD) WITH PROPOFOL;  Surgeon: Jerene Bears, MD;  [facility];  Service: Gastroenterology;  Laterality: N/A;  . INSERT / REPLACE / REMOVE PACEMAKER     medtronic        dr [name]    [place]   icd only  . KIDNEY TRANSPLANT  10/2012   [facility] nephrologist--Dr. Blair Heys  . LEFT HEART CATH AND CORONARY ANGIOGRAPHY N/A 02/08/2017   PCA and DES to mRCA--needs DAPT for at least 6 mo.   Procedure: LEFT HEART CATH AND CORONARY ANGIOGRAPHY;  Surgeon: Sherren Mocha, MD;  Location: [place] [facility];  Service: Cardiovascular;  Laterality: N/A;  . PARTIAL HIP ARTHROPLASTY Left 2010   left  . RENAL BIOPSY    . stents     05-2010 and 2- in 2010 and 1 in 2018.  [name] TOTAL HIP ARTHROPLASTY  07/09/2011   Procedure: TOTAL HIP ARTHROPLASTY;  Surgeon: Kerin Salen, MD;  Location: [place];  Service: Orthopedics;  Laterality: Right;  . TYMPANOSTOMY TUBE PLACEMENT  52 yrs old  . US ECHOCARDIOGRAPHY  12/2010; 02/2014;08/2014   02/2014 EF still 25-30%, increased PA pressures.  2016 EF 30-35%, sept/inf hypokinesis, mod tricusp regurg    Outpatient Medications Prior to Visit  Medication Sig Dispense Refill  . acetaminophen (TYLENOL) 500 MG tablet Take 1,000 mg by mouth every 6 (six) hours as needed for headache (pain).     [name] allopurinol (ZYLOPRIM) 100 MG tablet Take 1 tablet (100 mg total) by mouth daily. 90 tablet 3  . aspirin EC 81 MG tablet Take 81 mg by mouth daily.    [name] atorvastatin (LIPITOR) 80 MG tablet Take 80 mg by mouth at bedtime.    [name] BYSTOLIC 5 MG tablet TAKE 1 TABLET BY MOUTH DAILY 90 tablet 3  . Cholecalciferol (VITAMIN D-3) 5000 units TABS Take 1 tablet by mouth 4 (four)  times a week. Sunday, Monday, Wednesday, Friday morning    . clopidogrel (PLAVIX) 75 MG tablet TAKE 1 TABLET BY MOUTH DAILY 90 tablet 3  . DEXILANT 60 MG capsule TAKE 1 CAPSULE BY MOUTH DAILY BEFORE BREAKFAST 90 capsule 0  . losartan (COZAAR) 25 MG tablet Take 25 mg by mouth at bedtime.     . magnesium oxide (MAG-OX) 400 MG tablet Take 400 mg by mouth daily.    . mycophenolate (CELLCEPT) 250 MG capsule Take 3 capsules (750 mg total) by mouth 2 (  two) times daily. 540 capsule 1  . niacin (NIASPAN) 500 MG CR tablet TAKE 2 TABLETS BY MOUTH AT BEDTIME 180 tablet 3  . nitroGLYCERIN (NITROSTAT) 0.4 MG SL tablet Place 0.4 mg under the tongue every 5 (five) minutes as needed for chest pain (DO NOT EXCEED 3 DOSES).    . predniSONE (DELTASONE) 5 MG tablet TAKE 1 TABLET BY MOUTH DAILY 90 tablet 3  . ranitidine (ZANTAC) 150 MG tablet TAKE 1 TABLET BY MOUTH AT BEDTIME 90 tablet 0  . tacrolimus (PROGRAF) 0.5 MG capsule Take 0.5-1 mg by mouth See admin instructions. Take 1 capsule (0.5 mg) by mouth daily at bedtime; take 2 capsules (1 mg)Monday, Wednesday, Friday, & Sunday mornings, take 1 capsule (0.5 mg) Tuesday, Thursday, Saturday mornings    . zolpidem (AMBIEN) 5 MG tablet Take 5 mg by mouth at bedtime as needed for sleep.    . traMADol (ULTRAM) 50 MG tablet Take 50 mg by mouth every 6 (six) hours as needed. for pain  0   No facility-administered medications prior to visit.     Allergies  Allergen Reactions  . Triptans Palpitations    Reaction to Maxalt    ROS As per HPI  PE: Blood pressure 123/78, pulse 67, temperature 97.7 F (36.5 C), temperature source Oral, height '5\' 7"'$  (1.702 m), weight 227 lb (103 kg), SpO2 97 %. Gen: Alert, well appearing.  Patient is oriented to person, place, time, and situation. AFFECT: pleasant, lucid thought and speech. No further exam today.  LABS:    Chemistry      Component Value Date/Time   NA 139 05/18/2017 0220   NA 143 02/06/2017 1130   K 4.3 05/18/2017  0220   CL 103 05/18/2017 0220   CO2 28 05/18/2017 0220   BUN 24 (H) 05/18/2017 0220   BUN 15 02/06/2017 1130   CREATININE 1.40 (H) 05/18/2017 0220      Component Value Date/Time   CALCIUM 9.1 05/18/2017 0220   CALCIUM 9.1 04/23/2011 0820   ALKPHOS 75 07/17/2016 0542   AST 20 07/17/2016 0542   ALT 15 (L) 07/17/2016 0542   BILITOT 2.2 (H) 07/17/2016 0542     Lab Results  Component Value Date   WBC 13.4 (H) 05/18/2017   HGB 16.9 05/18/2017   HCT 49.3 05/18/2017   MCV 90.3 05/18/2017   PLT 181 05/18/2017   Lab Results  Component Value Date   CHOL 115 02/27/2017   HDL 65.30 02/27/2017   LDLCALC 35 02/27/2017   TRIG 74.0 02/27/2017   CHOLHDL 2 02/27/2017     IMPRESSION AND PLAN:  1) HTN: The current medical regimen is effective;  continue present plan and medications. Lytes/cr stable 05/2017.  2) HLD: tolerating statin. Lipids at goal 02/2017.  3) Hx of renal transplant/CRI: stable Cr/lytes 05/2017.   Has neph f/u soon.  4) Stress fracture 3rd metatarsal left foot: healing fine, seeing Dr. Mayer Camel. Dr. Mayer Camel requested that I order a bone density study on him given the lack of trauma prior, odd location of fx, and pt being on low dose prednisone chronically. No labs needed ?  5) GERD: doing ok on daily dexilant.  Will be getting EGD and colonoscopy later this spring after he has had 6 mo of uninterrupted DAPT since last stent placement.  An After Visit Summary was printed and given to the patient.  FOLLOW UP: Return in about 4 months (around 10/29/2017) for routine chronic illness f/u.  Signed:  Crissie Sickles, MD  07/01/2017     

## 2017-07-01 NOTE — Telephone Encounter (Signed)
Patient notified and will have nephrologist fill medication.

## 2017-07-05 ENCOUNTER — Ambulatory Visit (HOSPITAL_BASED_OUTPATIENT_CLINIC_OR_DEPARTMENT_OTHER)
Admission: RE | Admit: 2017-07-05 | Discharge: 2017-07-05 | Disposition: A | Discharge status: Home / Self Care | Payer: Managed Care, Other (non HMO) | Source: Ambulatory Visit | Attending: Family Medicine | Admitting: Family Medicine

## 2017-07-05 ENCOUNTER — Encounter: Payer: Self-pay | Admitting: Family Medicine

## 2017-07-05 DIAGNOSIS — M84375D Stress fracture, left foot, subsequent encounter for fracture with routine healing: Secondary | ICD-10-CM | POA: Diagnosis not present

## 2017-07-05 DIAGNOSIS — X58XXXD Exposure to other specified factors, subsequent encounter: Secondary | ICD-10-CM | POA: Diagnosis not present

## 2017-07-05 HISTORY — PX: OTHER SURGICAL HISTORY: SHX169

## 2017-07-08 ENCOUNTER — Encounter: Payer: Self-pay | Admitting: *Deleted

## 2017-07-11 ENCOUNTER — Ambulatory Visit (INDEPENDENT_AMBULATORY_CARE_PROVIDER_SITE_OTHER): Payer: Managed Care, Other (non HMO) | Admitting: *Deleted

## 2017-07-11 DIAGNOSIS — I255 Ischemic cardiomyopathy: Secondary | ICD-10-CM | POA: Diagnosis not present

## 2017-07-11 NOTE — Progress Notes (Signed)
Remote ICD transmission.   

## 2017-07-12 ENCOUNTER — Encounter: Payer: Self-pay | Admitting: Cardiology

## 2017-07-23 LAB — BASIC METABOLIC PANEL
BUN: 15 (ref 4–21)
Creatinine: 1.3 (ref 0.6–1.3)
Glucose: 120
Potassium: 4.5 (ref 3.4–5.3)
SODIUM: 139 (ref 137–147)

## 2017-07-23 LAB — LIPID PANEL
Cholesterol: 123 (ref 0–200)
HDL: 69 (ref 35–70)
LDL CALC: 39
TRIGLYCERIDES: 75 (ref 40–160)

## 2017-07-23 LAB — CBC AND DIFFERENTIAL
HCT: 48 (ref 41–53)
Hemoglobin: 16.8 (ref 13.5–17.5)
NEUTROS ABS: 5
Platelets: 163 (ref 150–399)
WBC: 8.7

## 2017-07-23 LAB — HEPATIC FUNCTION PANEL
ALT: 17 (ref 10–40)
AST: 18 (ref 14–40)
Alkaline Phosphatase: 113 (ref 25–125)
BILIRUBIN, TOTAL: 0.7

## 2017-07-27 LAB — CUP PACEART REMOTE DEVICE CHECK
Battery Voltage: 2.72 V
Brady Statistic RV Percent Paced: 0.18 %
Date Time Interrogation Session: 20190228030530
HIGH POWER IMPEDANCE MEASURED VALUE: 304 Ohm
HighPow Impedance: 58 Ohm
HighPow Impedance: 74 Ohm
Implantable Lead Implant Date: 20120709
Implantable Lead Location: 753860
Implantable Lead Model: 6947
Lead Channel Impedance Value: 361 Ohm
Lead Channel Pacing Threshold Amplitude: 1.375 V
Lead Channel Sensing Intrinsic Amplitude: 14.625 mV
Lead Channel Setting Pacing Amplitude: 2.75 V
MDC IDC MSMT LEADCHNL RV PACING THRESHOLD PULSEWIDTH: 0.4 ms
MDC IDC MSMT LEADCHNL RV SENSING INTR AMPL: 14.625 mV
MDC IDC PG IMPLANT DT: 20120709
MDC IDC SET LEADCHNL RV PACING PULSEWIDTH: 0.4 ms
MDC IDC SET LEADCHNL RV SENSING SENSITIVITY: 0.3 mV

## 2017-08-02 ENCOUNTER — Encounter: Payer: Self-pay | Admitting: Family Medicine

## 2017-08-19 NOTE — Progress Notes (Signed)
CARDIOLOGY OFFICE NOTE  Date:  08/27/2017    [name] Date of Birth: 05/17/1965 Medical Record #[id number]  PCP:  [name], MD  Cardiologist:  Johnsie Cancel    No chief complaint on file.   History of Present Illness:  51 y.o. history of MI 2001 with remote stents ot RCA and circumflex 2001 and BMS to LAD in 2012. Last cath 02/08/17 for angina showed  90% lesion in mid RCA received DES LAD and circumflex stents widely patent. Has EF 30% by echo 04/23/16 AICD Dr [name] and previous mural apical thrombus no longer on anticoagulation. Post renal transplant at [facility] 2012 Niece was donar. January 2019 seen at [facility] for SSCP thought to be hiatal hernia Medical Rx continued Baseline Cr 1.3 followed by [facility]aly. History of Lupus as etiology of CRF Intolerant to nitrates with headache   History of Barrett's and needs f/u EGD Discussed not having it until September this year 1 year after last DES No angina. Not very active needs to exercise more. Was in walking boot January broke foot   Past Medical History:  Diagnosis Date  . AICD (automatic cardioverter/defibrillator) present    Dr. Paschal Dopp follows remotely-yrly checks, Dr. Jonne Ply  . Avascular necrosis of bone of hip ([place]) 2010 surg   Left hip arthroplasty: from chronic systemic steroids taken for his Lupus  . Barrett's esophagus   . Blood transfusion without reported diagnosis 2010, 2012  . CAD (coronary artery disease)    a. stents RCA/Circ 2001 b. BMS to LAD 07/2010  c. 01/2017: s/p DES to RCA.   . Cataract    left eye  . CHF (congestive heart failure) ([place])   . Chronic headaches    Dr. [name] 06/2016: suspects tension HA's.  CT brain was considered (MRI was not approved by insurer) but ended up not being needed as of 09/2016.  His neurontin was increased to 600 mg bid but this caused hyperkalemia so he d/c'd this med.  HA's improved with decre caffeine and incre water.   Also got referred for eval for possible OSA.   Renal MD suspicous that his polycyt may be cause HAs..  . Chronic headaches 02/2017   As of 01/2017, HA's no better, neurologist ordered CT.  ESR normal at that time.  . Chronic renal insufficiency, stage II (mild)    GFR 65 ml/min 07/19/15 at local renal f/u (Cr 1.29).  GFR 64 ml/min (cr 1.3) at local renal f/u 01/16/17.  [name] GERD (gastroesophageal reflux disease)    Hx of esoph stricture and dilatation.  +Barrett's esophagus+  . Gout    s/p renal transplant he was weened off of his allopurinol.  . Hiatal hernia   . 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 History of renal transplant 11/10/2012   10/2012 [facility]   . Hyperlipidemia   . Hypertension   . IFG (impaired fasting glucose)   . implantable cardiac defibrillator single chamber    Medtronic (due to low EF)  . Ischemic cardiomyopathy    Chronic systolic dysfunction--  20/1007 EF 30-35%.   Single chamber ICD 11/20/10 ([name])  . Left ventricular thrombus 2012   Re-eval 02/2011 showed thrombus RESOLVED, so coumadin was d/c'd (was on it for 17mo  . Lupus   . OSA on CPAP 2018   Dr. [name]9/2018: +OSA on home sleep studay; CPAP auto titrate 5-20 started 05/2017.  .[name]  Osteoporosis   . Post-transplant erythrocytosis    improved with ARB (valsartan)    Past Surgical History:  Procedure Laterality Date  . AV FISTULA PLACEMENT  03/28/2011   Procedure: ARTERIOVENOUS (AV) FISTULA CREATION;  Surgeon: Rosetta Posner, MD;  Location: [place];  Service: Vascular;  Laterality: Left;  Creation of Left radiocephallic cimino fistula  . AV FISTULA PLACEMENT  04/27/2011   Procedure: ARTERIOVENOUS (AV) FISTULA CREATION;  Surgeon: Rosetta Posner, MD;  Location: [place];  Service: Vascular;  Laterality: Left;  left basilic vein transposition  . CARDIAC CATHETERIZATION     08/2010  . CARDIOVASCULAR STRESS TEST  07/2010; 03/2014   03/2014 showed large old infarct and EF 30-35% but no ischemia  .  DEXA  07/05/2017   Normal bone density in radius and spine.  . ESOPHAGOGASTRODUODENOSCOPY  02/2009   Done by Dr. Fuller Plan for hematemesis; esophagitis found.  Repeat recommended 09/2016, at which time he will also likely get his initial screening colonoscopy.  . ESOPHAGOGASTRODUODENOSCOPY (EGD) WITH PROPOFOL N/A 09/29/2015   REPEAT EGD RECOMMENDED 09/2016.  Barrett's esoph + mild chronic gastritis.  H pylori NEG. Procedure: ESOPHAGOGASTRODUODENOSCOPY (EGD) WITH PROPOFOL;  Surgeon: Jerene Bears, MD;  [facility];  Service: Gastroenterology;  Laterality: N/A;  . INSERT / REPLACE / REMOVE PACEMAKER     medtronic        dr [name]       icd only  . KIDNEY TRANSPLANT  10/2012   [facility] nephrologist--Dr. Blair Heys  . LEFT HEART CATH AND CORONARY ANGIOGRAPHY N/A 02/08/2017   PCA and DES to mRCA--needs DAPT for at least 6 mo.   Procedure: LEFT HEART CATH AND CORONARY ANGIOGRAPHY;  Surgeon: Sherren Mocha, MD;  Location: [place] [facility];  Service: Cardiovascular;  Laterality: N/A;  . PARTIAL HIP ARTHROPLASTY Left 2010   left  . RENAL BIOPSY    . stents     05-2010 and 2- in 2010 and 1 in 2018.  [name] TOTAL HIP ARTHROPLASTY  07/09/2011   Procedure: TOTAL HIP ARTHROPLASTY;  Surgeon: Kerin Salen, MD;  Location: [place];  Service: Orthopedics;  Laterality: Right;  . TYMPANOSTOMY TUBE PLACEMENT  52 yrs old  . US ECHOCARDIOGRAPHY  12/2010; 02/2014;08/2014   02/2014 EF still 25-30%, increased PA pressures.  2016 EF 30-35%, sept/inf hypokinesis, mod tricusp regurg     Medications: No outpatient medications have been marked as taking for the 08/27/17 encounter (Appointment) with Josue Hector, MD.     Allergies: Allergies  Allergen Reactions  . Triptans Palpitations    Reaction to Maxalt    Social History: The patient  reports that he quit smoking about 7 years ago. His smoking use included cigarettes. He has a 15.00 pack-year smoking history. He has never used smokeless tobacco. He  reports that he does not drink alcohol or use drugs.   Family History: The patient's family history includes Diabetes in his mother; Heart attack in his father and paternal grandfather; Heart attack (age of onset: 66) in his sister; Heart disease in his paternal aunt, paternal uncle, and sister; Hyperlipidemia in his father and sister; Hypertension in his father and sister; Kidney failure in his mother; Lupus in his mother; Stroke in his mother.   Review of Systems: Please see the history of present illness.   Otherwise, the review of systems is positive for none.   All other systems are reviewed and negative.   Physical Exam: VS:  There were no vitals  taken for this visit. [name]  BMI There is no height or weight on file to calculate BMI.  Wt Readings from Last 3 Encounters:  07/01/17 227 lb (103 kg)  06/04/17 232 lb 12.8 oz (105.6 kg)  05/23/17 235 lb 12 oz (106.9 kg)   There were no vitals taken for this visit. Affect appropriate Healthy:  appears stated age 18: normal Neck supple with no adenopathy JVP normal no bruits no thyromegaly Lungs clear with no wheezing and good diaphragmatic motion Heart:  S1/S2 no murmur, no rub, gallop or click PMI normal AICD under left clavicle  Abdomen: benighn, BS positve, no tenderness, no AAA no bruit.  No HSM or HJR Distal pulses intact with no bruits No edema Neuro non-focal Skin warm and dry No muscular weakness s/p right THR      LABORATORY DATA:  EKG:  02/09/17 SR LAD old anterior MI   Lab Results  Component Value Date   WBC 8.7 07/23/2017   HGB 16.8 07/23/2017   HCT 48 07/23/2017   PLT 163 07/23/2017   GLUCOSE 106 (H) 05/18/2017   CHOL 123 07/23/2017   TRIG 75 07/23/2017   HDL 69 07/23/2017   LDLCALC 39 07/23/2017   ALT 17 07/23/2017   AST 18 07/23/2017   NA 139 07/23/2017   K 4.5 07/23/2017   CL 103 05/18/2017   CREATININE 1.3 07/23/2017   BUN 15 07/23/2017   CO2 28 05/18/2017   TSH 0.52 09/03/2013   PSA 1.71  10/23/2010   INR 1.1 02/06/2017   HGBA1C 5.7 02/28/2017     BNP (last 3 results) No results for input(s): BNP in the last 8760 hours.  ProBNP (last 3 results) No results for input(s): PROBNP in the last 8760 hours.   Other Studies Reviewed Today:  LEFT HEART CATH AND CORONARY ANGIOGRAPHY 01/2017  Conclusion     Ost RCA to Prox RCA lesion, 0 %stenosed.  Dist RCA lesion, 75 %stenosed.  Prox LAD to Mid LAD lesion, 20 %stenosed.  Ost 1st Mrg to 1st Mrg lesion, 40 %stenosed.  A STENT SYNERGY DES [id number] drug eluting stent was successfully placed.  Mid RCA lesion, 90 %stenosed.  Post intervention, there is a 0% residual stenosis.   1. Severe calcific RCA stenosis treated successfully with PCI (implantation of a 3.0x28 mm Synergy DES) 2. Patent LAD stent with mild in-stent restenosis 3. Patent LCx stent with mild in-stent restenosis  Recommend: continue DAPT long-term as tolerated (minimum 6 months without interruption)    Echocardiogram 04/23/2016 Study Conclusions  - Left ventricle: The cavity size was moderately dilated. Wall thickness was increased in a pattern of mild LVH. The estimated ejection fraction was 30%. Left ventricular diastolic function parameters were normal. - Left atrium: The atrium was severely dilated. - Atrial septum: No defect or patent foramen ovale was identified. - Pulmonary arteries: PA peak pressure: 55 mm Hg (S).   Assessment/Plan:   1. CAD with prior MI x 2 and ischemic DCM  Stents in all 3 major epicardial vessels   mid RCA September 2018 last stent  Imdur stopped due to headaches continue medical Rx  2. Ischemic cardomyopathy:  EF 30% by last echo in 04/2016.euvolemic continue medical Rx   3. Underlying ICD - followed by EP Dr [name] no D/C ;s   4. CKD with prior renal transplant F/U [place] Cr 1.3 Continue prednisone, prograft  and cellcept   5. HLD - on statin therapy.     6. GERD: with hiatal  hernia continue  dexilant and zantac needs f/u EGD with Pyrtle but needs To wait until September given DES to RCA   [facility]

## 2017-08-27 ENCOUNTER — Ambulatory Visit: Payer: Managed Care, Other (non HMO) | Admitting: Cardiovascular Disease

## 2017-08-27 ENCOUNTER — Encounter: Payer: Self-pay | Admitting: Cardiovascular Disease

## 2017-08-27 VITALS — BP 102/40 | HR 70 | Ht 67.0 in | Wt 226.0 lb

## 2017-08-27 DIAGNOSIS — I2583 Coronary atherosclerosis due to lipid rich plaque: Secondary | ICD-10-CM

## 2017-08-27 DIAGNOSIS — I255 Ischemic cardiomyopathy: Secondary | ICD-10-CM | POA: Diagnosis not present

## 2017-08-27 DIAGNOSIS — I2510 Atherosclerotic heart disease of native coronary artery without angina pectoris: Secondary | ICD-10-CM | POA: Diagnosis not present

## 2017-08-27 DIAGNOSIS — E782 Mixed hyperlipidemia: Secondary | ICD-10-CM

## 2017-08-27 NOTE — Patient Instructions (Addendum)
Medication Instructions:  Your physician recommends that you continue on your current medications as directed. Please refer to the Current Medication list given to you today.  Labwork: NONE  Testing/Procedures: NONE  Follow-Up: Your physician wants you to follow-up in: September with Dr. Johnsie Cancel.   If you need a refill on your cardiac medications before your next appointment, please call your pharmacy.

## 2017-09-05 ENCOUNTER — Ambulatory Visit: Payer: Managed Care, Other (non HMO) | Admitting: Internal Medicine

## 2017-09-05 ENCOUNTER — Encounter: Payer: Self-pay | Admitting: Internal Medicine

## 2017-09-05 VITALS — BP 138/84 | HR 83 | Ht 67.0 in | Wt 228.8 lb

## 2017-09-05 DIAGNOSIS — G4733 Obstructive sleep apnea (adult) (pediatric): Secondary | ICD-10-CM | POA: Diagnosis not present

## 2017-09-05 NOTE — Patient Instructions (Addendum)
Order- DME APS  Please change CPAP auto range to 5-12, continue mask of choice, humidifier supplies, AirView  We will check a download in 2 weeks at the new pressure.  Please call if we can help

## 2017-09-05 NOTE — Progress Notes (Signed)
HPI male former smoker followed for OSA, complicated by Lupus, HBP, CAD, MI/CM/CHF/AICD, renal transplant, GERD/Barrett's HST 04/24/17-AHI 15/hour, desaturation to 84%, body weight 221 pounds  ------------------------------------------------------------------------------------------------ 06/04/17-52 year old male former smoker followed for OSA, complicated by Lupus, HBP, CAD, MI/CM/CHF/AICD, renal transplant, GERD/Barrett's HST 04/24/17-AHI 15/hour, desaturation to 84%, body weight 221 pounds ----Pt had HST on 04-20-17, requesting results and recommendations We reviewed his sleep study, medical concerns and treatment options and answer questions. He is choosing to start CPAP-auto 5-20.  09/05/17- 52 year old male former smoker followed for OSA, complicated by Lupus, HBP, CAD, MI/CM/CHF/AICD, renal transplant, GERD/Barrett's CPAP auto 5-20/ APS ----OSA; DME: APS. Pt wears CPAP but feel pressure is too high-DL attached. ? Mask fit at sleep center to find full face mask instead of nasal.  Download 97%, AHI 2.4/ hr He likes his nasal mask.  He worked with the DME but could not get fullface mask to seal with his beard.  Pressures over 12 push his mouth open.  Has a chinstrap but it has to be too tight to keep mouth closed.  ROS-see HPI   + = Positive Constitutional:    weight loss, night sweats, fevers, chills, fatigue, lassitude. HEENT:    + headaches, difficulty swallowing, tooth/dental problems, sore throat,       sneezing, itching, ear ache, nasal congestion, post nasal drip, snoring CV:    chest pain, orthopnea, PND, swelling in lower extremities, anasarca,                                                    dizziness, palpitations Resp:   shortness of breath with exertion or at rest.                productive cough,   non-productive cough, coughing up of blood.              change in color of mucus.  wheezing.   Skin:    rash or lesions. GI:  No-   heartburn, indigestion, abdominal pain,  nausea, vomiting, diarrhea,                 change in bowel habits, loss of appetite GU: dysuria, change in color of urine, no urgency or frequency.   flank pain. MS:   joint pain, stiffness, decreased range of motion, back pain. Neuro-     nothing unusual Psych:  change in mood or affect.  depression or anxiety.   memory loss.  OBJ- Physical Exam General- Alert, Oriented, Affect-appropriate, Distress- none acute, + overweight Skin- rash-none, lesions- none, excoriation- none Lymphadenopathy- none Head- atraumatic            Eyes- Gross vision intact, PERRLA, conjunctivae and secretions clear            Ears- Hearing, canals-normal            Nose- Clear, no-Septal dev, mucus, polyps, erosion, perforation             Throat- Mallampati II , mucosa clear , drainage- none, tonsils- atrophic Neck- flexible , trachea midline, no stridor , thyroid nl, carotid no bruit Chest - symmetrical excursion , unlabored           Heart/CV- RRR , no murmur , no gallop  , no rub, nl s1 s2                           -  JVD- none , edema- none, stasis changes- none, varices- none           Lung- clear to P&A, wheeze- none, cough- none , dullness-none, rub- none           Chest wall- + AICD R Abd-  Br/ Gen/ Rectal- Not done, not indicated Extrem- cyanosis- none, clubbing, none, atrophy- none, strength- nl Neuro- grossly intact to observation

## 2017-09-05 NOTE — Assessment & Plan Note (Signed)
Download confirms good compliance and control although he is having to deal with mask fit issues.  He sleeps better with CPAP despite this. Plan-we will compromise, reducing AutoPap range to 5-12.  If this is insufficient we will work with sleep center to refit mask. Plan-reduce AutoPap to 5-12.  Recheck Airview in 2 weeks.

## 2017-09-16 ENCOUNTER — Encounter: Payer: Self-pay | Admitting: Internal Medicine

## 2017-09-18 ENCOUNTER — Encounter: Payer: Self-pay | Admitting: Internal Medicine

## 2017-10-10 ENCOUNTER — Ambulatory Visit (INDEPENDENT_AMBULATORY_CARE_PROVIDER_SITE_OTHER): Payer: Managed Care, Other (non HMO) | Admitting: *Deleted

## 2017-10-10 DIAGNOSIS — I255 Ischemic cardiomyopathy: Secondary | ICD-10-CM | POA: Diagnosis not present

## 2017-10-10 NOTE — Progress Notes (Signed)
Remote ICD transmission.   

## 2017-10-14 ENCOUNTER — Encounter: Payer: Self-pay | Admitting: Cardiology

## 2017-10-15 LAB — CUP PACEART REMOTE DEVICE CHECK
Battery Voltage: 2.67 V
Brady Statistic RV Percent Paced: 0.21 %
HIGH POWER IMPEDANCE MEASURED VALUE: 304 Ohm
HIGH POWER IMPEDANCE MEASURED VALUE: 56 Ohm
HIGH POWER IMPEDANCE MEASURED VALUE: 68 Ohm
Implantable Lead Implant Date: 20120709
Implantable Lead Location: 753860
Lead Channel Setting Pacing Pulse Width: 0.4 ms
Lead Channel Setting Sensing Sensitivity: 0.3 mV
MDC IDC MSMT LEADCHNL RV IMPEDANCE VALUE: 361 Ohm
MDC IDC MSMT LEADCHNL RV PACING THRESHOLD AMPLITUDE: 1.5 V
MDC IDC MSMT LEADCHNL RV PACING THRESHOLD PULSEWIDTH: 0.4 ms
MDC IDC MSMT LEADCHNL RV SENSING INTR AMPL: 14.875 mV
MDC IDC MSMT LEADCHNL RV SENSING INTR AMPL: 14.875 mV
MDC IDC PG IMPLANT DT: 20120709
MDC IDC SESS DTM: 20190530110335
MDC IDC SET LEADCHNL RV PACING AMPLITUDE: 3 V

## 2017-10-15 LAB — HEMOGLOBIN A1C: Hgb A1c MFr Bld: 5.7 (ref 4.0–6.0)

## 2017-10-28 ENCOUNTER — Ambulatory Visit: Payer: Managed Care, Other (non HMO) | Admitting: Family Medicine

## 2017-10-29 ENCOUNTER — Encounter: Payer: Self-pay | Admitting: Family Medicine

## 2017-10-29 ENCOUNTER — Ambulatory Visit: Payer: Managed Care, Other (non HMO) | Admitting: Family Medicine

## 2017-10-29 VITALS — BP 117/66 | HR 58 | Temp 98.2°F | Resp 16 | Ht 67.0 in | Wt 230.1 lb

## 2017-10-29 DIAGNOSIS — E7800 Pure hypercholesterolemia, unspecified: Secondary | ICD-10-CM | POA: Diagnosis not present

## 2017-10-29 DIAGNOSIS — M328 Other forms of systemic lupus erythematosus: Secondary | ICD-10-CM | POA: Diagnosis not present

## 2017-10-29 DIAGNOSIS — Z9989 Dependence on other enabling machines and devices: Secondary | ICD-10-CM | POA: Diagnosis not present

## 2017-10-29 DIAGNOSIS — I10 Essential (primary) hypertension: Secondary | ICD-10-CM

## 2017-10-29 DIAGNOSIS — N182 Chronic kidney disease, stage 2 (mild): Secondary | ICD-10-CM | POA: Diagnosis not present

## 2017-10-29 DIAGNOSIS — G4733 Obstructive sleep apnea (adult) (pediatric): Secondary | ICD-10-CM

## 2017-10-29 DIAGNOSIS — I2510 Atherosclerotic heart disease of native coronary artery without angina pectoris: Secondary | ICD-10-CM

## 2017-10-29 DIAGNOSIS — E669 Obesity, unspecified: Secondary | ICD-10-CM | POA: Diagnosis not present

## 2017-10-29 DIAGNOSIS — Z940 Kidney transplant status: Secondary | ICD-10-CM

## 2017-10-29 NOTE — Progress Notes (Signed)
OFFICE VISIT  10/29/2017   CC:  Chief Complaint  Patient presents with  . Follow-up    RCI, labs done at [facility] one week ago    HPI:    Patient is a 52 y.o. Caucasian male who presents for 4 mo f/u SLE in remission, CRI stage II (s/p renal transplant), HTN, HLD. Has CAD with stents, on DAPT, ICM w/EF 30%, has ICD.  Feeling well.  No signif joint or muscle pain.  He had f/u visit with [facility] earlier this month, labs about a week ago.  Reviewed this office note. Reviewed labs from pt's phone today: Gluc 127, A1c 5.7%, Cr 1.3/GFR 64 ml/min, Hb16.4 (all these are stable).  Doing well on CPAP, HAs better.  His tacrolimus dose was lowered slightly. His Vit D was discontinued  ROS: no fevers, no rashes, no CP, no SOB, no LE swelling, no focal weakness, no melena or hematochezia.    Past Medical History:  Diagnosis Date  . AICD (automatic cardioverter/defibrillator) present    Dr. Paschal Dopp follows remotely-yrly checks, Dr. Jonne Ply  . Avascular necrosis of bone of hip ([place]) 2010 surg   Left hip arthroplasty: from chronic systemic steroids taken for his Lupus  . Barrett's esophagus   . Blood transfusion without reported diagnosis 2010, 2012  . CAD (coronary artery disease)    a. stents RCA/Circ 2001 b. BMS to LAD 07/2010  c. 01/2017: s/p DES to RCA.   . Cataract    left eye  . CHF (congestive heart failure) ([place])   . Chronic headaches    Dr. [name] 06/2016: suspects tension HA's.  CT brain was considered (MRI was not approved by insurer) but ended up not being needed as of 09/2016.  His neurontin was increased to 600 mg bid but this caused hyperkalemia so he d/c'd this med.  HA's improved with decre caffeine and incre water.   Also got referred for eval for possible OSA.  Renal MD suspicous that his polycyt may be cause HAs..  . Chronic headaches 02/2017   As of 01/2017, HA's no better, neurologist ordered CT.  ESR normal at that time.  . Chronic renal  insufficiency, stage II (mild)    GFR 65 ml/min 07/19/15 at local renal f/u (Cr 1.29).  GFR 64 ml/min (cr 1.3) at local renal f/u 01/16/17.  [name] GERD (gastroesophageal reflux disease)    Hx of esoph stricture and dilatation.  +Barrett's esophagus+  . Gout    s/p renal transplant he was weened off of his allopurinol.  . Hiatal hernia   . 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 History of renal transplant 11/10/2012   10/2012 [facility]   . Hyperlipidemia   . Hypertension   . IFG (impaired fasting glucose)   . implantable cardiac defibrillator single chamber    Medtronic (due to low EF)  . Ischemic cardiomyopathy    Chronic systolic dysfunction--  16/9450 EF 30-35%.   Single chamber ICD 11/20/10 ([name])  . Left ventricular thrombus 2012   Re-eval 02/2011 showed thrombus RESOLVED, so coumadin was d/c'd (was on it for 65mo  . [place]   . OSA on CPAP 2018   Dr. [name]9/2018: +OSA on home sleep studay; CPAP auto titrate 5-20 started 05/2017.  . Osteoporosis   . Post-transplant erythrocytosis    improved with ARB (valsartan)    Past Surgical History:  Procedure Laterality Date  .  AV FISTULA PLACEMENT  03/28/2011   Procedure: ARTERIOVENOUS (AV) FISTULA CREATION;  Surgeon: Rosetta Posner, MD;  Location: [place];  Service: Vascular;  Laterality: Left;  Creation of Left radiocephallic cimino fistula  . AV FISTULA PLACEMENT  04/27/2011   Procedure: ARTERIOVENOUS (AV) FISTULA CREATION;  Surgeon: Rosetta Posner, MD;  Location: [place];  Service: Vascular;  Laterality: Left;  left basilic vein transposition  . CARDIAC CATHETERIZATION     08/2010  . CARDIOVASCULAR STRESS TEST  07/2010; 03/2014   03/2014 showed large old infarct and EF 30-35% but no ischemia  . DEXA  07/05/2017   Normal bone density in radius and spine.  . ESOPHAGOGASTRODUODENOSCOPY  02/2009   Done by Dr. Fuller Plan for hematemesis; esophagitis found.  Repeat recommended  09/2016, at which time he will also likely get his initial screening colonoscopy.  . ESOPHAGOGASTRODUODENOSCOPY (EGD) WITH PROPOFOL N/A 09/29/2015   REPEAT EGD RECOMMENDED 09/2016.  Barrett's esoph + mild chronic gastritis.  H pylori NEG. Procedure: ESOPHAGOGASTRODUODENOSCOPY (EGD) WITH PROPOFOL;  Surgeon: Jerene Bears, MD;  [facility];  Service: Gastroenterology;  Laterality: N/A;  . INSERT / REPLACE / REMOVE PACEMAKER     medtronic        dr [name]    [place]   icd only  . KIDNEY TRANSPLANT  10/2012   [facility] nephrologist--Dr. Blair Heys  . LEFT HEART CATH AND CORONARY ANGIOGRAPHY N/A 02/08/2017   PCA and DES to mRCA--needs DAPT for at least 6 mo.   Procedure: LEFT HEART CATH AND CORONARY ANGIOGRAPHY;  Surgeon: Sherren Mocha, MD;  Location: [place] [facility];  Service: Cardiovascular;  Laterality: N/A;  . PARTIAL HIP ARTHROPLASTY Left 2010   left  . RENAL BIOPSY    . stents     05-2010 and 2- in 2010 and 1 in 2018.  [name] TOTAL HIP ARTHROPLASTY  07/09/2011   Procedure: TOTAL HIP ARTHROPLASTY;  Surgeon: Kerin Salen, MD;  Location: [place];  Service: Orthopedics;  Laterality: Right;  . TYMPANOSTOMY TUBE PLACEMENT  52 yrs old  . US ECHOCARDIOGRAPHY  12/2010; 02/2014;08/2014   02/2014 EF still 25-30%, increased PA pressures.  2016 EF 30-35%, sept/inf hypokinesis, mod tricusp regurg    Outpatient Medications Prior to Visit  Medication Sig Dispense Refill  . acetaminophen (TYLENOL) 500 MG tablet Take 1,000 mg by mouth every 6 (six) hours as needed for headache (pain).     [name] allopurinol (ZYLOPRIM) 100 MG tablet Take 1 tablet (100 mg total) by mouth daily. 90 tablet 3  . aspirin EC 81 MG tablet Take 81 mg by mouth daily.    [name] atorvastatin (LIPITOR) 80 MG tablet Take 80 mg by mouth at bedtime.    [name] BYSTOLIC 5 MG tablet TAKE 1 TABLET BY MOUTH DAILY 90 tablet 3  . clopidogrel (PLAVIX) 75 MG tablet TAKE 1 TABLET BY MOUTH DAILY 90 tablet 3  . DEXILANT 60 MG capsule TAKE 1 CAPSULE BY MOUTH  DAILY BEFORE BREAKFAST 90 capsule 0  . losartan (COZAAR) 25 MG tablet Take 25 mg by mouth at bedtime.     . magnesium oxide (MAG-OX) 400 MG tablet Take 400 mg by mouth daily.    . mycophenolate (CELLCEPT) 250 MG capsule Take 3 capsules (750 mg total) by mouth 2 (two) times daily. 540 capsule 1  . niacin (NIASPAN) 500 MG CR tablet TAKE 2 TABLETS BY MOUTH AT BEDTIME 180 tablet 3  . nitroGLYCERIN (NITROSTAT) 0.4 MG SL tablet Place 0.4 mg under  the tongue every 5 (five) minutes as needed for chest pain (DO NOT EXCEED 3 DOSES).    . predniSONE (DELTASONE) 5 MG tablet TAKE 1 TABLET BY MOUTH DAILY 90 tablet 3  . ranitidine (ZANTAC) 150 MG tablet TAKE 1 TABLET BY MOUTH AT BEDTIME 90 tablet 0  . tacrolimus (PROGRAF) 0.5 MG capsule Take 0.5 mg by mouth 2 (two) times daily.     . traMADol (ULTRAM) 50 MG tablet Take 50 mg by mouth every 6 (six) hours as needed. for pain  0  . zolpidem (AMBIEN) 5 MG tablet Take 5 mg by mouth at bedtime as needed for sleep.    . Cholecalciferol (VITAMIN D-3) 5000 units TABS Take 1 tablet by mouth 4 (four) times a week. Sunday, Monday, Wednesday, Friday morning     No facility-administered medications prior to visit.     Allergies  Allergen Reactions  . Triptans Palpitations    Reaction to Maxalt    ROS As per HPI  PE: Blood pressure 117/66, pulse (!) 58, temperature 98.2 F (36.8 C), temperature source Oral, resp. rate 16, height 5' 7"  (1.702 m), weight 230 lb 2 oz (104.4 kg), SpO2 97 %. Gen: Alert, well appearing.  Patient is oriented to person, place, time, and situation. AFFECT: pleasant, lucid thought and speech. No further exam today.  LABS:  Lab Results  Component Value Date   HGBA1C 5.7 02/28/2017      Chemistry      Component Value Date/Time   NA 139 07/23/2017   K 4.5 07/23/2017   CL 103 05/18/2017 0220   CO2 28 05/18/2017 0220   BUN 15 07/23/2017   CREATININE 1.3 07/23/2017   CREATININE 1.40 (H) 05/18/2017 0220   GLU 120 07/23/2017       Component Value Date/Time   CALCIUM 9.1 05/18/2017 0220   CALCIUM 9.1 04/23/2011 0820   ALKPHOS 113 07/23/2017   AST 18 07/23/2017   ALT 17 07/23/2017   BILITOT 2.2 (H) 07/17/2016 0542     Lab Results  Component Value Date   CHOL 123 07/23/2017   HDL 69 07/23/2017   LDLCALC 39 07/23/2017   TRIG 75 07/23/2017   CHOLHDL 2 02/27/2017   Lab Results  Component Value Date   WBC 8.7 07/23/2017   HGB 16.8 07/23/2017   HCT 48 07/23/2017   MCV 90.3 05/18/2017   PLT 163 07/23/2017    Lab Results  Component Value Date   HGBA1C 5.7 02/28/2017    IMPRESSION AND PLAN:  1) SLE--long term remission.  2) CRI stage II--stable as per recent [facility] transplant clinic labs this month.  3) Hx of renal transplant: continue antirejection drugs, adjust doses as recommended by transplant clinic and/or local nephrologist.    4) HTN: The current medical regimen is effective;  continue present plan and medications.  5) HLD: tolerating high dose lipitor.  Lipid panel excellent 07/2017.  6) OSA on CPAP: feeling better since getting on this, particularly his HA's.  7) GI: he is due for repeat EGD (Barrett's esoph) and also for his initial screening colonoscopy: these will be done concurrently once patient has been off of his DAPT (has to be on this minimum of 1 yr--this will be up 01/2018). Dr. Hilarie Fredrickson is pt's GI MD.  8) Prediabetes: last 2 A1c's stable.  TLC recommended.  An After Visit Summary was printed and given to the patient.  FOLLOW UP: Return in about 4 months (around 02/28/2018) for routine chronic illness f/u.  Signed:  Crissie Sickles, MD           10/29/2017

## 2017-12-10 IMAGING — DX DG CHEST 2V
2 series · 2 of 2 positions shown · non-contrast
Comparison: Radiographs 07/15/2016 and 04/17/2013.

CLINICAL DATA: Follow up aspiration pneumonia.

EXAM:
CHEST  2 VIEW

[chest pa]
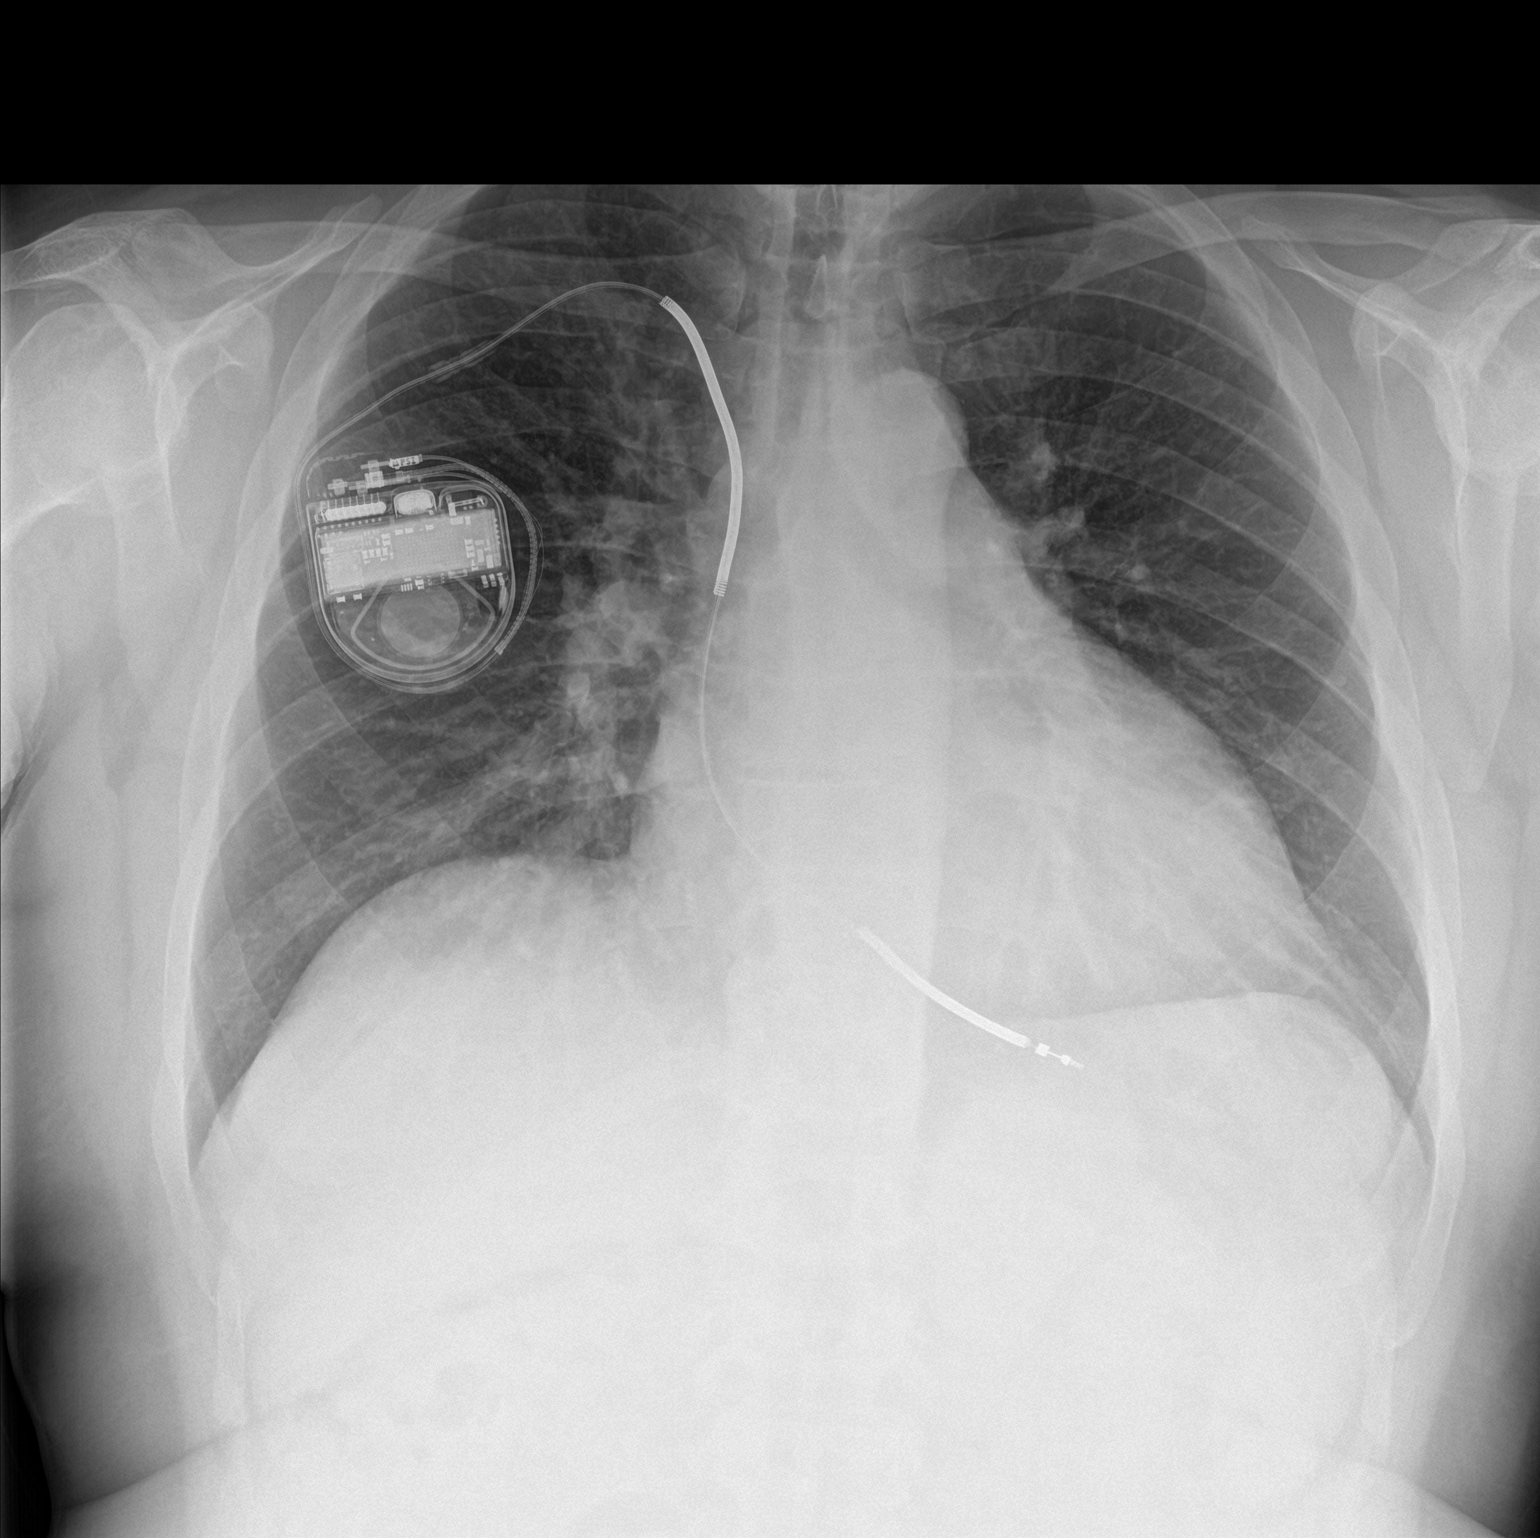

[chest lat]
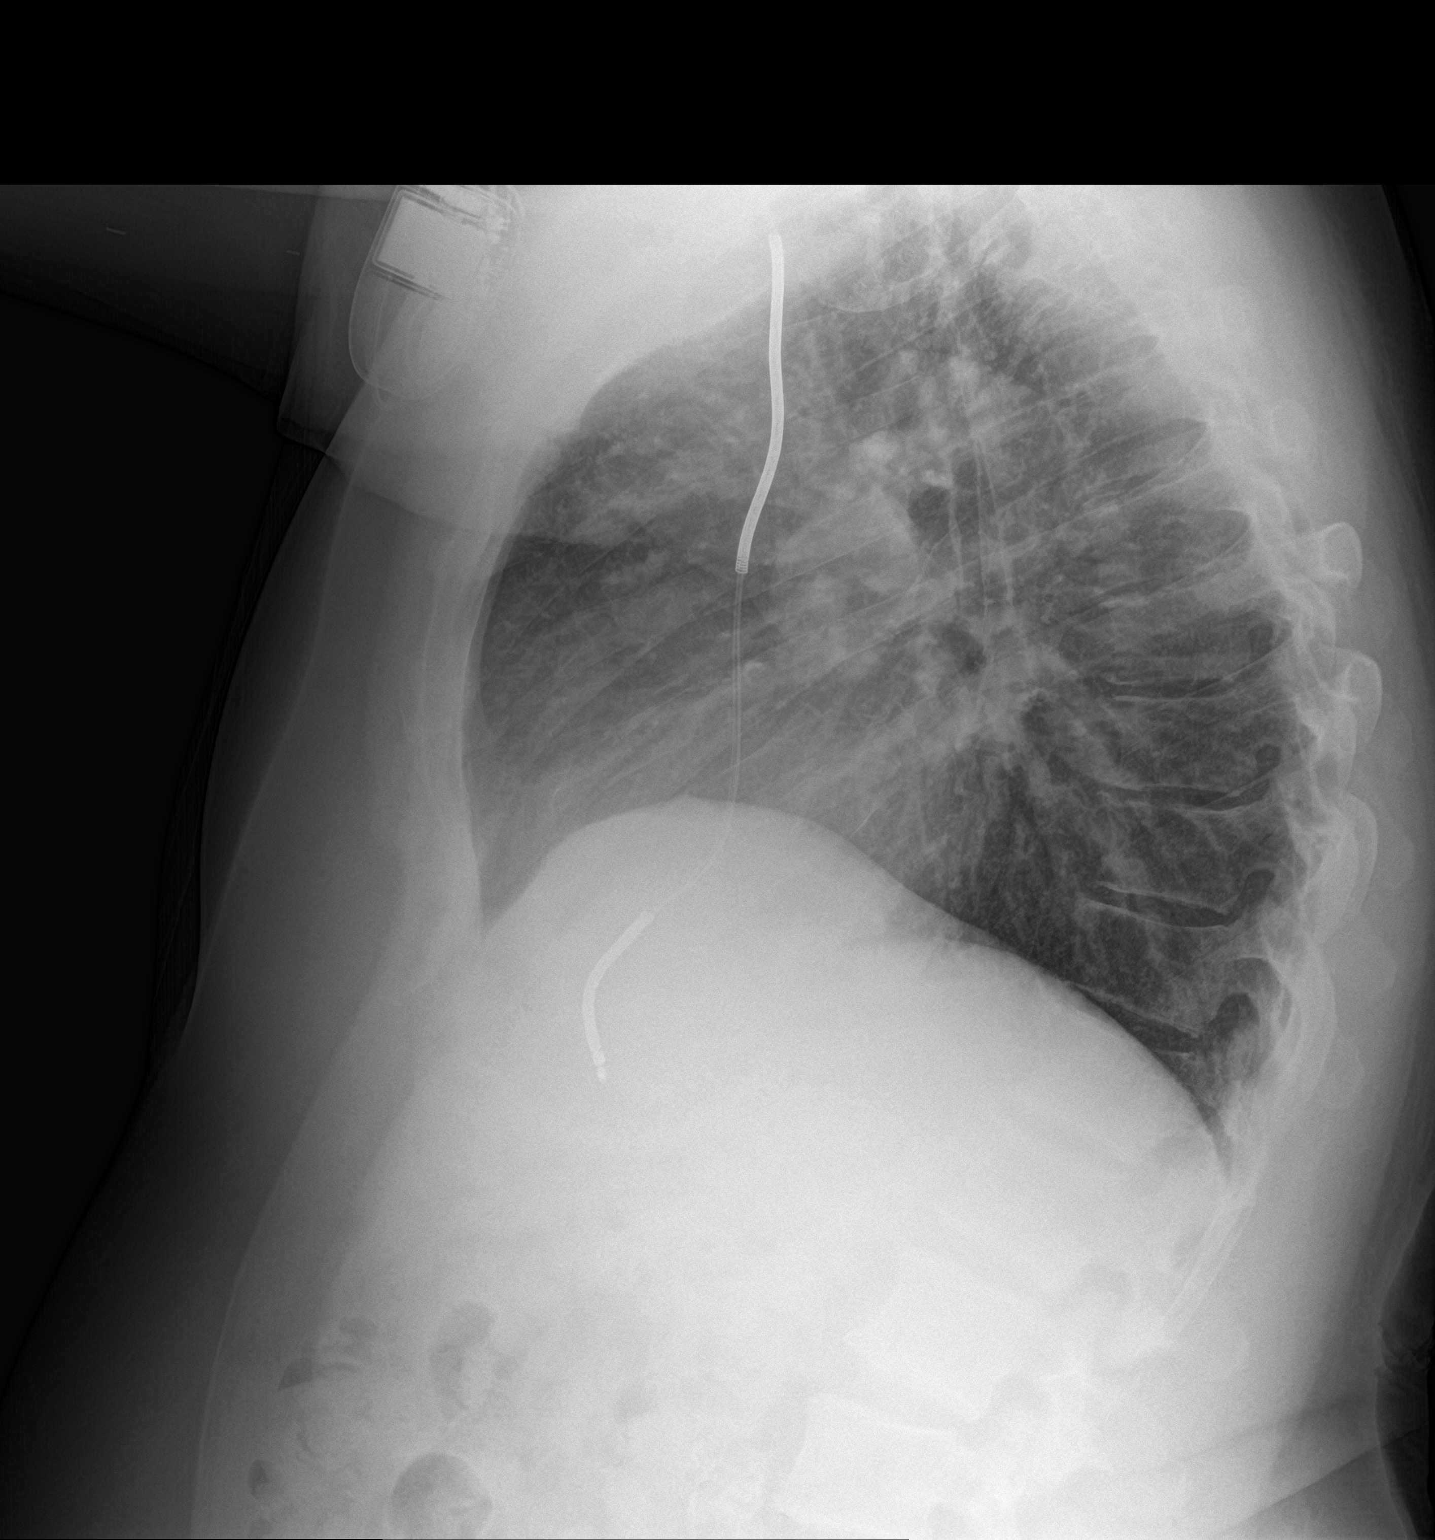

[2 of 2 positions shown; findings below may reference images not displayed]

FINDINGS: Right subclavian AICD appears unchanged. There is stable
cardiomegaly and central vascular congestion. No residual focal
airspace disease, pleural effusion or pneumothorax. The bones appear
unchanged.
IMPRESSION: Stable cardiomegaly and chronic vascular congestion. No evidence of
pneumonia.

## 2018-01-06 ENCOUNTER — Encounter: Payer: Self-pay | Admitting: Primary Care

## 2018-01-06 ENCOUNTER — Ambulatory Visit: Payer: Managed Care, Other (non HMO) | Admitting: Primary Care

## 2018-01-06 DIAGNOSIS — G4733 Obstructive sleep apnea (adult) (pediatric): Secondary | ICD-10-CM

## 2018-01-06 DIAGNOSIS — Z23 Encounter for immunization: Secondary | ICD-10-CM

## 2018-01-06 NOTE — Patient Instructions (Signed)
Good work wearing CPAP, excellent compliance Don't drive if experiencing excessive daytime fatigue  Keep settings the same FU in 1 year

## 2018-01-06 NOTE — Assessment & Plan Note (Addendum)
Doing better with lower setting, reports improvement in Headaches with CPAP use - 98% compliance > 4hrs. AHI 2.4.  - Auto titrate 5-12cm H20. Pressure 95th percentile 11.3  - FU in 1 year  - Received flu shot today

## 2018-01-06 NOTE — Progress Notes (Signed)
@Patient  ID: Roy Dyer, male    DOB: 05-04-1966, 52 y.o.   MRN: [id number]  Chief Complaint  Patient presents with  . Follow-up    nothing different    Referring provider: [name], MD  HPI: 52 year old male, former smoker. Hx OSA, HST 04/2017 showed AHI 15/hr. Patient of Dr. Annamaria Boots, last seen in April 2019. Wears CPAP but dealing with mask fit issues. Pressure over 12 keep mouth open, chin strap has to be really tight to work. Download reviewed showing good compliance in April. AHI 2.4. Dr. Annamaria Boots reduce auto titrate to 5-12cm H20. Mask of choice with humidification. If this is insufficient will work with sleep center to refit mask.   01/06/2018 Patient presents today for 4 month follow-up. Airview download reviewed showing AHI 2.4. Compliance 98% >4hrs. Headache are gone. Feels better with lower pressure setting. Received influenza vaccine today.    Allergies  Allergen Reactions  . Triptans Palpitations    Reaction to Maxalt    Immunization History  Administered Date(s) Administered  . Hepatitis B 07/30/2011  . Influenza Split 02/22/2011  . Influenza,inj,Quad PF,6+ Mos 02/08/2014, 01/18/2017, 01/06/2018  . Influenza-Unspecified 02/02/2015, 01/25/2016  . PPD Test 06/25/2011  . Pneumococcal Conjugate-13 01/20/2014  . Pneumococcal Polysaccharide-23 07/30/2011, 02/09/2017  . Tdap 10/23/2010    Past Medical History:  Diagnosis Date  . AICD (automatic cardioverter/defibrillator) present    Dr. Paschal Dopp follows remotely-yrly checks, Dr. Jonne Ply  . Avascular necrosis of bone of hip ([place]) 2010 surg   Left hip arthroplasty: from chronic systemic steroids taken for his Lupus  . Barrett's esophagus   . Blood transfusion without reported diagnosis 2010, 2012  . CAD (coronary artery disease)    a. stents RCA/Circ 2001 b. BMS to LAD 07/2010  c. 01/2017: s/p DES to RCA.   . Cataract    left eye  . CHF (congestive heart failure) ([place])   . Chronic headaches    Dr.  [name] 06/2016: suspects tension HA's.  CT brain was considered (MRI was not approved by insurer) but ended up not being needed as of 09/2016.  His neurontin was increased to 600 mg bid but this caused hyperkalemia so he d/c'd this med.  HA's improved with decre caffeine and incre water.   Also got referred for eval for possible OSA.  Renal MD suspicous that his polycyt may be cause HAs..  . Chronic headaches 02/2017   As of 01/2017, HA's no better, neurologist ordered CT.  ESR normal at that time.  . Chronic renal insufficiency, stage II (mild)    GFR 65 ml/min 07/19/15 at local renal f/u (Cr 1.29).  GFR 64 ml/min (cr 1.3) at local renal f/u 01/16/17.  [name] GERD (gastroesophageal reflux disease)    Hx of esoph stricture and dilatation.  +Barrett's esophagus+  . Gout    s/p renal transplant he was weened off of his allopurinol.  . Hiatal hernia   . 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 History of renal transplant 11/10/2012   10/2012 [facility]   . Hyperlipidemia   . Hypertension   . IFG (impaired fasting glucose)   . implantable cardiac defibrillator single chamber    Medtronic (due to low EF)  . Ischemic cardiomyopathy    Chronic systolic dysfunction--  43/3295 EF 30-35%.   Single chamber ICD 11/20/10 ([name])  . Left ventricular thrombus 2012   Re-eval 02/2011 showed thrombus  RESOLVED, so coumadin was d/c'd (was on it for 73mo  . Lupus ([place]   . OSA on CPAP 2018   Dr. [name]9/2018: +OSA on home sleep studay; CPAP auto titrate 5-20 started 05/2017.  . Osteoporosis   . Post-transplant erythrocytosis    improved with ARB (valsartan)    Tobacco History: Social History   Tobacco Use  Smoking Status Former Smoker  . Packs/day: 0.50  . Years: 30.00  . Pack years: 15.00  . Types: Cigarettes  . Last attempt to quit: 08/02/2010  . Years since quitting: 7.4  Smokeless Tobacco Never Used   Counseling given: Not  Answered   Outpatient Medications Prior to Visit  Medication Sig Dispense Refill  . acetaminophen (TYLENOL) 500 MG tablet Take 1,000 mg by mouth every 6 (six) hours as needed for headache (pain).     .Marland Kitchenallopurinol (ZYLOPRIM) 100 MG tablet Take 1 tablet (100 mg total) by mouth daily. 90 tablet 3  . aspirin EC 81 MG tablet Take 81 mg by mouth daily.    .Marland Kitchenatorvastatin (LIPITOR) 80 MG tablet Take 80 mg by mouth at bedtime.    .Marland KitchenBYSTOLIC 5 MG tablet TAKE 1 TABLET BY MOUTH DAILY 90 tablet 3  . clopidogrel (PLAVIX) 75 MG tablet TAKE 1 TABLET BY MOUTH DAILY 90 tablet 3  . DEXILANT 60 MG capsule TAKE 1 CAPSULE BY MOUTH DAILY BEFORE BREAKFAST 90 capsule 0  . losartan (COZAAR) 25 MG tablet Take 25 mg by mouth at bedtime.     . magnesium oxide (MAG-OX) 400 MG tablet Take 400 mg by mouth daily.    . mycophenolate (CELLCEPT) 250 MG capsule Take 3 capsules (750 mg total) by mouth 2 (two) times daily. 540 capsule 1  . niacin (NIASPAN) 500 MG CR tablet TAKE 2 TABLETS BY MOUTH AT BEDTIME 180 tablet 3  . nitroGLYCERIN (NITROSTAT) 0.4 MG SL tablet Place 0.4 mg under the tongue every 5 (five) minutes as needed for chest pain (DO NOT EXCEED 3 DOSES).    . predniSONE (DELTASONE) 5 MG tablet TAKE 1 TABLET BY MOUTH DAILY 90 tablet 3  . ranitidine (ZANTAC) 150 MG tablet TAKE 1 TABLET BY MOUTH AT BEDTIME 90 tablet 0  . tacrolimus (PROGRAF) 0.5 MG capsule Take 0.5 mg by mouth 2 (two) times daily.     . traMADol (ULTRAM) 50 MG tablet Take 50 mg by mouth every 6 (six) hours as needed. for pain  0  . zolpidem (AMBIEN) 5 MG tablet Take 5 mg by mouth at bedtime as needed for sleep.     No facility-administered medications prior to visit.     Review of Systems  Review of Systems  Constitutional: Negative.   HENT: Negative.   Respiratory: Negative.   Cardiovascular: Negative.   Skin: Negative.   Neurological: Negative.  Negative for headaches.     Physical Exam  BP 130/74 (BP Location: Right Arm, Cuff Size:  Normal)   Pulse 68   Ht 5' 7"  (1.702 m)   Wt 231 lb (104.8 kg)   SpO2 98%   BMI 36.18 kg/m  Physical Exam  Constitutional: He is oriented to person, place, and time. He appears well-developed and well-nourished.  HENT:  Head: Normocephalic and atraumatic.  Eyes: Pupils are equal, round, and reactive to light. EOM are normal.  Neck: Normal range of motion. Neck supple.  Cardiovascular: Normal rate, regular rhythm, normal heart sounds and intact distal pulses.  Pulmonary/Chest: Effort normal and breath sounds normal. No respiratory  distress. He has no wheezes.  Abdominal: Soft. Bowel sounds are normal. There is no tenderness.  Neurological: He is alert and oriented to person, place, and time.  Skin: Skin is warm and dry. No rash noted. No erythema.  Psychiatric: He has a normal mood and affect. His behavior is normal. Judgment normal.     Lab Results:  CBC    Component Value Date/Time   WBC 8.7 07/23/2017   WBC 13.4 (H) 05/18/2017 0220   RBC 5.46 05/18/2017 0220   HGB 16.8 07/23/2017   HGB 16.7 02/06/2017 1130   HCT 48 07/23/2017   HCT 48.4 02/06/2017 1130   PLT 163 07/23/2017   PLT 190 02/06/2017 1130   MCV 90.3 05/18/2017 0220   MCV 90 02/06/2017 1130   MCH 31.0 05/18/2017 0220   MCHC 34.3 05/18/2017 0220   RDW 13.6 05/18/2017 0220   RDW 13.4 02/06/2017 1130   LYMPHSABS 1.8 02/27/2017 0822   MONOABS 0.7 02/27/2017 0822   EOSABS 0.4 02/27/2017 0822   BASOSABS 0.1 02/27/2017 0822    BMET    Component Value Date/Time   NA 139 07/23/2017   K 4.5 07/23/2017   CL 103 05/18/2017 0220   CO2 28 05/18/2017 0220   GLUCOSE 106 (H) 05/18/2017 0220   BUN 15 07/23/2017   CREATININE 1.3 07/23/2017   CREATININE 1.40 (H) 05/18/2017 0220   CALCIUM 9.1 05/18/2017 0220   CALCIUM 9.1 04/23/2011 0820   GFRNONAA 57 (L) 05/18/2017 0220   GFRAA >60 05/18/2017 0220     Imaging: No results found.   Assessment & Plan:   Obstructive sleep apnea Doing better with lower  setting, reports improvement in Headaches with CPAP use - 98% compliance > 4hrs. AHI 2.4.  - Auto titrate 5-12cm H20. Pressure 95th percentile 11.3  - FU in 1 year  - Received flu shot today      Martyn Ehrich, NP 01/06/2018

## 2018-01-09 ENCOUNTER — Ambulatory Visit: Payer: Managed Care, Other (non HMO) | Admitting: Internal Medicine

## 2018-01-09 ENCOUNTER — Telehealth: Payer: Self-pay

## 2018-01-09 ENCOUNTER — Encounter: Payer: Managed Care, Other (non HMO) | Admitting: *Deleted

## 2018-01-09 NOTE — Telephone Encounter (Signed)
LMOVM for pt to return call. Need to attempt to help pt trouble shoot his home monitor.

## 2018-01-09 NOTE — Telephone Encounter (Signed)
Spoke with pt and reminded pt of remote transmission that is due today. Pt verbalized understanding.  Pt having some difficulties with his home monitor. I asked him did he have the number to [facility] he stated yes. He going to try again later.

## 2018-01-10 ENCOUNTER — Encounter: Payer: Self-pay | Admitting: Cardiology

## 2018-01-16 ENCOUNTER — Encounter: Payer: Managed Care, Other (non HMO) | Admitting: *Deleted

## 2018-01-20 ENCOUNTER — Ambulatory Visit (INDEPENDENT_AMBULATORY_CARE_PROVIDER_SITE_OTHER): Payer: Managed Care, Other (non HMO) | Admitting: *Deleted

## 2018-01-20 ENCOUNTER — Encounter: Payer: Self-pay | Admitting: Cardiology

## 2018-01-20 DIAGNOSIS — I255 Ischemic cardiomyopathy: Secondary | ICD-10-CM | POA: Diagnosis not present

## 2018-01-20 NOTE — Progress Notes (Signed)
Remote ICD transmission.   

## 2018-01-21 LAB — LIPID PANEL
Cholesterol: 114 (ref 0–200)
HDL: 67 (ref 35–70)
LDL CALC: 35
Triglycerides: 60 (ref 40–160)

## 2018-01-21 LAB — CBC AND DIFFERENTIAL
HCT: 47 (ref 41–53)
Hemoglobin: 15.9 (ref 13.5–17.5)
NEUTROS ABS: 6
PLATELETS: 165 (ref 150–399)
WBC: 9.2

## 2018-01-21 LAB — BASIC METABOLIC PANEL
BUN: 14 (ref 4–21)
Creatinine: 1.2 (ref 0.6–1.3)
Glucose: 107
Potassium: 4 (ref 3.4–5.3)
Sodium: 142 (ref 137–147)

## 2018-01-21 LAB — HEPATIC FUNCTION PANEL
ALK PHOS: 109 (ref 25–125)
ALT: 14 (ref 10–40)
AST: 18 (ref 14–40)
Bilirubin, Total: 1

## 2018-01-27 NOTE — Progress Notes (Signed)
CARDIOLOGY OFFICE NOTE  Date:  02/03/2018    Roy Dyer Date of Birth: May 21, 1965 Medical Record #[id number]  PCP:  [name], MD  Cardiologist:  Johnsie Dyer    No chief complaint on file.   History of Present Illness:  52 y.o. history of MI 2001 with remote stents ot RCA and circumflex 2001 and BMS to LAD in 2012. Last cath 02/08/17 for angina showed  90% lesion in mid RCA received DES LAD and circumflex stents widely patent. Has EF 30% by echo 04/23/16 AICD Dr Roy Dyer and previous mural apical thrombus no longer on anticoagulation. Post renal transplant at [facility] 2012 Niece was donar. January 2019 seen at [facility] for SSCP thought to be hiatal hernia Medical Rx continued Baseline Cr 1.3 followed by Clover Mealy. History of Lupus as etiology of CRF Intolerant to nitrates with headache   History of Barrett's and needs f/u EGD Now a year post last intervention so ok to hold DAT for EGD if scheduled  Doing well on recall list for EP to have AICD checked     Past Medical History:  Diagnosis Date  . AICD (automatic cardioverter/defibrillator) present    Dr. Paschal Dopp follows remotely-yrly checks, Dr. Jonne Ply  . Avascular necrosis of bone of hip ([place]) 2010 surg   Left hip arthroplasty: from chronic systemic steroids taken for his Lupus  . Barrett's esophagus   . Blood transfusion without reported diagnosis 2010, 2012  . CAD (coronary artery disease)    a. stents RCA/Circ 2001 b. BMS to LAD 07/2010  c. 01/2017: s/p DES to RCA.   . Cataract    left eye  . CHF (congestive heart failure) ([place])   . Chronic headaches    Dr. Tomi Dyer 06/2016: suspects tension HA's.  CT brain was considered (MRI was not approved by insurer) but ended up not being needed as of 09/2016.  His neurontin was increased to 600 mg bid but this caused hyperkalemia so he d/c'd this med.  HA's improved with decre caffeine and incre water.   Also got referred for eval for possible OSA.  Renal MD suspicous that his  polycyt may be cause HAs..  . Chronic headaches 02/2017   As of 01/2017, HA's no better, neurologist ordered CT.  ESR normal at that time.  . Chronic renal insufficiency, stage II (mild)    GFR 65 ml/min 07/19/15 at local renal f/u (Cr 1.29).  GFR 64 ml/min (cr 1.3) at local renal f/u 01/16/17.  [name] GERD (gastroesophageal reflux disease)    Hx of esoph stricture and dilatation.  +Barrett's esophagus+  . Gout    s/p renal transplant he was weened off of his allopurinol.  . Hiatal hernia   . 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 History of renal transplant 11/10/2012   10/2012 [facility]   . Hyperlipidemia   . Hypertension   . IFG (impaired fasting glucose)   . implantable cardiac defibrillator single chamber    Medtronic (due to low EF)  . Ischemic cardiomyopathy    Chronic systolic dysfunction--  76/2831 EF 30-35%.   Single chamber ICD 11/20/10 (Dr. Caryl Dyer)  . Left ventricular thrombus 2012   Re-eval 02/2011 showed thrombus RESOLVED, so coumadin was d/c'd (was on it for 72mo  . [place]us ([place]   . OSA on CPAP 2018   Dr. [name]9/2018: +OSA on home sleep studay; CPAP auto titrate 5-20 started 05/2017.  .[name]  Osteoporosis   . Post-transplant erythrocytosis    improved with ARB (valsartan)    Past Surgical History:  Procedure Laterality Date  . AV FISTULA PLACEMENT  03/28/2011   Procedure: ARTERIOVENOUS (AV) FISTULA CREATION;  Surgeon: Rosetta Posner, MD;  Location: [place];  Service: Vascular;  Laterality: Left;  Creation of Left radiocephallic cimino fistula  . AV FISTULA PLACEMENT  04/27/2011   Procedure: ARTERIOVENOUS (AV) FISTULA CREATION;  Surgeon: Rosetta Posner, MD;  Location: [place];  Service: Vascular;  Laterality: Left;  left basilic vein transposition  . CARDIAC CATHETERIZATION     08/2010  . CARDIOVASCULAR STRESS TEST  07/2010; 03/2014   03/2014 showed large old infarct and EF 30-35% but no ischemia  . DEXA  07/05/2017    Normal bone density in radius and spine.  . ESOPHAGOGASTRODUODENOSCOPY  02/2009   Done by Dr. Fuller Plan for hematemesis; esophagitis found.  Repeat recommended 09/2016, at which time he will also likely get his initial screening colonoscopy.  . ESOPHAGOGASTRODUODENOSCOPY (EGD) WITH PROPOFOL N/A 09/29/2015   REPEAT EGD RECOMMENDED 09/2016.  Barrett's esoph + mild chronic gastritis.  H pylori NEG. Procedure: ESOPHAGOGASTRODUODENOSCOPY (EGD) WITH PROPOFOL;  Surgeon: Jerene Bears, MD;  Location: [facility];  Service: Gastroenterology;  Laterality: N/A;  . INSERT / REPLACE / REMOVE PACEMAKER     medtronic        dr [name]    [place]   icd only  . KIDNEY TRANSPLANT  10/2012   [facility] nephrologist--Dr. Blair Dyer  . LEFT HEART CATH AND CORONARY ANGIOGRAPHY N/A 02/08/2017   PCA and DES to mRCA--needs DAPT for at least 6 mo.   Procedure: LEFT HEART CATH AND CORONARY ANGIOGRAPHY;  Surgeon: Sherren Mocha, MD;  Location: [place] [facility];  Service: Cardiovascular;  Laterality: N/A;  . PARTIAL HIP ARTHROPLASTY Left 2010   left  . RENAL BIOPSY    . stents     05-2010 and 2- in 2010 and 1 in 2018.  [name] TOTAL HIP ARTHROPLASTY  07/09/2011   Procedure: TOTAL HIP ARTHROPLASTY;  Surgeon: Roy Salen, MD;  Location: [place];  Service: Orthopedics;  Laterality: Right;  . TYMPANOSTOMY TUBE PLACEMENT  52 yrs old  . US ECHOCARDIOGRAPHY  12/2010; 02/2014;08/2014   02/2014 EF still 25-30%, increased PA pressures.  2016 EF 30-35%, sept/inf hypokinesis, mod tricusp regurg     Medications: Current Meds  Medication Sig  . acetaminophen (TYLENOL) 500 MG tablet Take 1,000 mg by mouth every 6 (six) hours as needed for headache (pain).   [name] allopurinol (ZYLOPRIM) 100 MG tablet Take 1 tablet (100 mg total) by mouth daily.  [name] aspirin EC 81 MG tablet Take 81 mg by mouth daily.  [name] atorvastatin (LIPITOR) 80 MG tablet Take 80 mg by mouth at bedtime.  [name] BYSTOLIC 5 MG tablet TAKE 1 TABLET BY MOUTH DAILY  . clopidogrel (PLAVIX) 75 MG  tablet TAKE 1 TABLET BY MOUTH DAILY  . DEXILANT 60 MG capsule TAKE 1 CAPSULE BY MOUTH DAILY BEFORE BREAKFAST  . losartan (COZAAR) 25 MG tablet Take 25 mg by mouth at bedtime.   . magnesium oxide (MAG-OX) 400 MG tablet Take 400 mg by mouth daily.  . mycophenolate (CELLCEPT) 250 MG capsule Take 3 capsules (750 mg total) by mouth 2 (two) times daily.  . niacin (NIASPAN) 500 MG CR tablet TAKE 2 TABLETS BY MOUTH AT BEDTIME  . nitroGLYCERIN (NITROSTAT) 0.4 MG SL tablet Place 0.4 mg under the tongue every 5 (five) minutes  as needed for chest pain (DO NOT EXCEED 3 DOSES).  . predniSONE (DELTASONE) 5 MG tablet TAKE 1 TABLET BY MOUTH DAILY  . ranitidine (ZANTAC) 150 MG tablet TAKE 1 TABLET BY MOUTH AT BEDTIME  . tacrolimus (PROGRAF) 0.5 MG capsule Take 0.5 mg by mouth 2 (two) times daily.   . traMADol (ULTRAM) 50 MG tablet Take 50 mg by mouth every 6 (six) hours as needed. for pain  . zolpidem (AMBIEN) 5 MG tablet Take 5 mg by mouth at bedtime as needed for sleep.     Allergies: Allergies  Allergen Reactions  . Triptans Palpitations    Reaction to Maxalt    Social History: The patient  reports that he quit smoking about 7 years ago. His smoking use included cigarettes. He has a 15.00 pack-year smoking history. He has never used smokeless tobacco. He reports that he does not drink alcohol or use drugs.   Family History: The patient's family history includes Diabetes in his mother; Heart attack in his father and paternal grandfather; Heart attack (age of onset: 54) in his sister; Heart disease in his paternal aunt, paternal uncle, and sister; Hyperlipidemia in his father and sister; Hypertension in his father and sister; Kidney failure in his mother; Lupus in his mother; Stroke in his mother.   Review of Systems: Please see the history of present illness.   Otherwise, the review of systems is positive for none.   All other systems are reviewed and negative.   Physical Exam: VS:  BP 118/84    Pulse 70   Ht _0  (1.702 m)   Wt 231 lb 8 oz (105 kg)   SpO2 97%   BMI 36.26 kg/m  .  BMI Body mass index is 36.26 kg/m.  Wt Readings from Last 3 Encounters:  02/03/18 231 lb 8 oz (105 kg)  01/06/18 231 lb (104.8 kg)  10/29/17 230 lb 2 oz (104.4 kg)   BP 118/84   Pulse 70   Ht _1  (1.702 m)   Wt 231 lb 8 oz (105 kg)   SpO2 97%   BMI 36.26 kg/m    Affect appropriate Healthy:  appears stated age HEENT: normal Neck supple with no adenopathy JVP normal no bruits no thyromegaly Lungs clear with no wheezing and good diaphragmatic motion Heart:  S1/S2 no murmur, no rub, gallop or click PMI normal Abdomen: benighn, BS positve, no tenderness, no AAA no bruit.  No HSM or HJR Distal pulses intact with no bruits No edema Neuro non-focal Skin warm and dry AICD under left clavicle  No muscular weakness s/p right THR     LABORATORY DATA:  EKG:  02/09/17 SR LAD old anterior MI   Lab Results  Component Value Date   WBC 8.7 07/23/2017   HGB 16.8 07/23/2017   HCT 48 07/23/2017   PLT 163 07/23/2017   GLUCOSE 106 (H) 05/18/2017   CHOL 123 07/23/2017   TRIG 75 07/23/2017   HDL 69 07/23/2017   LDLCALC 39 07/23/2017   ALT 17 07/23/2017   AST 18 07/23/2017   NA 139 07/23/2017   K 4.5 07/23/2017   CL 103 05/18/2017   CREATININE 1.3 07/23/2017   BUN 15 07/23/2017   CO2 28 05/18/2017   TSH 0.52 09/03/2013   PSA 1.71 10/23/2010   INR 1.1 02/06/2017   HGBA1C 5.7 02/28/2017     BNP (last 3 results) No results for input(s): BNP in the last 8760 hours.  ProBNP (last 3 results)  No results for input(s): PROBNP in the last 8760 hours.   Other Studies Reviewed Today:  LEFT HEART CATH AND CORONARY ANGIOGRAPHY 01/2017  Conclusion     Ost RCA to Prox RCA lesion, 0 %stenosed.  Dist RCA lesion, 75 %stenosed.  Prox LAD to Mid LAD lesion, 20 %stenosed.  Ost 1st Mrg to 1st Mrg lesion, 40 %stenosed.  A STENT SYNERGY DES 3X28 drug eluting stent was successfully  placed.  Mid RCA lesion, 90 %stenosed.  Post intervention, there is a 0% residual stenosis.   1. Severe calcific RCA stenosis treated successfully with PCI (implantation of a 3.0x28 mm Synergy DES) 2. Patent LAD stent with mild in-stent restenosis 3. Patent LCx stent with mild in-stent restenosis  Recommend: continue DAPT long-term as tolerated (minimum 6 months without interruption)    Echocardiogram 04/23/2016 Study Conclusions  - Left ventricle: The cavity size was moderately dilated. Wall thickness was increased in a pattern of mild LVH. The estimated ejection fraction was 30%. Left ventricular diastolic function parameters were normal. - Left atrium: The atrium was severely dilated. - Atrial septum: No defect or patent foramen ovale was identified. - Pulmonary arteries: PA peak pressure: 55 mm Hg (S).   Assessment/Plan:   1. CAD with prior MI x 2 and ischemic DCM  Stents in all 3 major epicardial vessels   mid RCA September 2018 last stent  Imdur stopped due to headaches continue medical Rx  2. Ischemic cardomyopathy:  EF 30% by last echo in 04/2016.euvolemic continue medical Rx   3. Underlying ICD - followed by EP Dr Roy Dyer no D/C ;s home monitor   4. CKD with prior renal transplant F/U [place] Cr 1.33 March 2019  Continue prednisone, prograft  and cellcept   5. HLD - on statin therapy.   LDL 39 07/23/17   6. GERD: with hiatal hernia continue dexilant and zantac needs f/u EGD with Pyrtle He is a year out From last stent so ok to hold plavix at this point   [employer]

## 2018-02-03 ENCOUNTER — Ambulatory Visit: Payer: Managed Care, Other (non HMO) | Admitting: Cardiovascular Disease

## 2018-02-03 ENCOUNTER — Encounter: Payer: Self-pay | Admitting: Cardiovascular Disease

## 2018-02-03 VITALS — BP 118/84 | HR 70 | Ht 67.0 in | Wt 231.5 lb

## 2018-02-03 DIAGNOSIS — I255 Ischemic cardiomyopathy: Secondary | ICD-10-CM

## 2018-02-03 DIAGNOSIS — I2583 Coronary atherosclerosis due to lipid rich plaque: Secondary | ICD-10-CM

## 2018-02-03 DIAGNOSIS — E782 Mixed hyperlipidemia: Secondary | ICD-10-CM | POA: Diagnosis not present

## 2018-02-03 DIAGNOSIS — I2510 Atherosclerotic heart disease of native coronary artery without angina pectoris: Secondary | ICD-10-CM

## 2018-02-03 NOTE — Patient Instructions (Addendum)
Medication Instructions:  Your physician recommends that you continue on your current medications as directed. Please refer to the Current Medication list given to you today.  Labwork: NONE  Testing/Procedures: NONE  Follow-Up: Your physician recommends that you schedule a follow-up appointment with [name] next available.  Your physician wants you to follow-up in: 6 months with Dr. Johnsie Cancel. You will receive a reminder letter in the mail two months in advance. If you don't receive a letter, please call our office to schedule the follow-up appointment.   If you need a refill on your cardiac medications before your next appointment, please call your pharmacy.

## 2018-02-09 ENCOUNTER — Encounter: Payer: Self-pay | Admitting: Family Medicine

## 2018-02-11 LAB — CUP PACEART REMOTE DEVICE CHECK
Brady Statistic RV Percent Paced: 0.2 %
HighPow Impedance: 304 Ohm
HighPow Impedance: 64 Ohm
HighPow Impedance: 73 Ohm
Implantable Lead Implant Date: 20120709
Implantable Lead Model: 6947
Implantable Pulse Generator Implant Date: 20120709
Lead Channel Pacing Threshold Pulse Width: 0.4 ms
Lead Channel Setting Pacing Amplitude: 3.25 V
Lead Channel Setting Pacing Pulse Width: 0.4 ms
Lead Channel Setting Sensing Sensitivity: 0.3 mV
MDC IDC LEAD LOCATION: 753860
MDC IDC MSMT BATTERY VOLTAGE: 2.63 V
MDC IDC MSMT LEADCHNL RV IMPEDANCE VALUE: 399 Ohm
MDC IDC MSMT LEADCHNL RV PACING THRESHOLD AMPLITUDE: 1.5 V
MDC IDC MSMT LEADCHNL RV SENSING INTR AMPL: 14 mV
MDC IDC MSMT LEADCHNL RV SENSING INTR AMPL: 14 mV
MDC IDC SESS DTM: 20190909165300

## 2018-03-03 ENCOUNTER — Ambulatory Visit: Payer: Managed Care, Other (non HMO) | Admitting: Family Medicine

## 2018-03-03 ENCOUNTER — Encounter: Payer: Self-pay | Admitting: Family Medicine

## 2018-03-03 VITALS — BP 127/74 | HR 65 | Temp 98.1°F | Resp 16 | Ht 67.0 in | Wt 231.1 lb

## 2018-03-03 DIAGNOSIS — E7800 Pure hypercholesterolemia, unspecified: Secondary | ICD-10-CM

## 2018-03-03 DIAGNOSIS — I10 Essential (primary) hypertension: Secondary | ICD-10-CM | POA: Diagnosis not present

## 2018-03-03 DIAGNOSIS — Z940 Kidney transplant status: Secondary | ICD-10-CM | POA: Diagnosis not present

## 2018-03-03 DIAGNOSIS — N182 Chronic kidney disease, stage 2 (mild): Secondary | ICD-10-CM

## 2018-03-03 DIAGNOSIS — E669 Obesity, unspecified: Secondary | ICD-10-CM | POA: Diagnosis not present

## 2018-03-03 DIAGNOSIS — F5101 Primary insomnia: Secondary | ICD-10-CM

## 2018-03-03 DIAGNOSIS — N2889 Other specified disorders of kidney and ureter: Secondary | ICD-10-CM

## 2018-03-03 DIAGNOSIS — K219 Gastro-esophageal reflux disease without esophagitis: Secondary | ICD-10-CM

## 2018-03-03 MED ORDER — FAMOTIDINE 20 MG PO TABS: ORAL_TABLET | ORAL | 6 refills | Status: DC

## 2018-03-03 NOTE — Progress Notes (Signed)
OFFICE VISIT  03/09/2018   CC:  Chief Complaint  Patient presents with  . Follow-up    RCI, pt is not fasting.     HPI:    Patient is a 52 y.o. Caucasian male who presents for f/u HTN, HLD, insomnia. 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 Labs at [facility] from 01/07/18 reviewed on pt's phone today:  Cr stable at 1.3. Met panel normal, uric acid normal, cholesterol panel EXCELLENT.  Has severe GERD and is on H2 blocker and PPI therapy.  He is due for surveillance EGD for Barrett's esoph and also for initial screening colonoscopy.  Has been given the go-ahead by Dr. Johnsie Cancel to be off plavix x 1 week before getting these done.  Home bp's: normal at home, <130/80. HLD: tolerating high dose atorva. Hx of insomnia but RARELY takes zolpidem anymore--no rx needed.  ROS: no CP, no SOB, no wheezing, no cough, no dizziness, no HAs, no rashes, no melena/hematochezia.  No polyuria or polydipsia.  No myalgias or arthralgias.  Past Medical History:  Diagnosis Date  . AICD (automatic cardioverter/defibrillator) present    Dr. Paschal Dopp follows remotely-yrly checks, Dr. Jonne Ply  . Avascular necrosis of bone of hip ([place]) 2010 surg   Left hip arthroplasty: from chronic systemic steroids taken for his Lupus  . Barrett's esophagus   . Blood transfusion without reported diagnosis 2010, 2012  . CAD (coronary artery disease)    a. stents RCA/Circ 2001 b. BMS to LAD 07/2010  c. 01/2017: s/p DES to RCA.   . Cataract    left eye  . CHF (congestive heart failure) ([place])   . Chronic headaches    Dr. [name] 06/2016: suspects tension HA's.  CT brain was considered (MRI was not approved by insurer) but ended up not being needed as of 09/2016.  His neurontin was increased to 600 mg bid but this caused hyperkalemia so he d/c'd this med.  HA's improved with decre caffeine and incre water.   Also got referred for eval for possible OSA.  Renal MD suspicous that his  polycyt may be cause HAs..  . Chronic headaches 02/2017   As of 01/2017, HA's no better, neurologist ordered CT.  ESR normal at that time.  . Chronic renal insufficiency, stage II (mild)    GFR 65 ml/min 07/19/15 at local renal f/u (Cr 1.29).  GFR 64 ml/min (cr 1.3) at local renal f/u 01/16/17.  [name] GERD (gastroesophageal reflux disease)    Hx of esoph stricture and dilatation.  +Barrett's esophagus+hx of aspiration pneumonia-->to get rpt EGD when he comes off DAPT (utd as of 01/29/18)  . Gout    s/p renal transplant he was weened off of his allopurinol.  . Hiatal hernia   . 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 History of renal transplant 11/10/2012   10/2012 [facility]   . Hyperlipidemia   . Hypertension   . IFG (impaired fasting glucose)   . implantable cardiac defibrillator single chamber    Medtronic (due to low EF)  . Ischemic cardiomyopathy    Chronic systolic dysfunction--  16/3846 EF 30-35%.   Single chamber ICD 11/20/10 ([name])  . Left ventricular thrombus 2012   Re-eval 02/2011 showed thrombus RESOLVED, so coumadin was d/c'd (was on it for 12mo  . Lupus ([place]   . OSA on CPAP 2018  Dr. Annamaria Boots 01/2017: +OSA on home sleep studay; CPAP auto titrate 5-20 started 05/2017.  . Osteoporosis   . Post-transplant erythrocytosis    improved with ARB (valsartan)    Past Surgical History:  Procedure Laterality Date  . AV FISTULA PLACEMENT  03/28/2011   Procedure: ARTERIOVENOUS (AV) FISTULA CREATION;  Surgeon: Rosetta Posner, MD;  Location: [place];  Service: Vascular;  Laterality: Left;  Creation of Left radiocephallic cimino fistula  . AV FISTULA PLACEMENT  04/27/2011   Procedure: ARTERIOVENOUS (AV) FISTULA CREATION;  Surgeon: Rosetta Posner, MD;  Location: [place];  Service: Vascular;  Laterality: Left;  left basilic vein transposition  . CARDIAC CATHETERIZATION     08/2010  . CARDIOVASCULAR STRESS TEST  07/2010; 03/2014    03/2014 showed large old infarct and EF 30-35% but no ischemia  . DEXA  07/05/2017   Normal bone density in radius and spine.  . ESOPHAGOGASTRODUODENOSCOPY  02/2009   Done by Dr. Fuller Plan for hematemesis; esophagitis found.  Repeat recommended 09/2016, at which time he will also likely get his initial screening colonoscopy.  . ESOPHAGOGASTRODUODENOSCOPY (EGD) WITH PROPOFOL N/A 09/29/2015   REPEAT EGD RECOMMENDED 09/2016.  Barrett's esoph + mild chronic gastritis.  H pylori NEG. Procedure: ESOPHAGOGASTRODUODENOSCOPY (EGD) WITH PROPOFOL;  Surgeon: Jerene Bears, MD;  [facility];  Service: Gastroenterology;  Laterality: N/A;  . INSERT / REPLACE / REMOVE PACEMAKER     medtronic        dr [name]    [place]   icd only  . KIDNEY TRANSPLANT  10/2012   [facility] nephrologist--Dr. Blair Heys  . LEFT HEART CATH AND CORONARY ANGIOGRAPHY N/A 02/08/2017   PCA and DES to mRCA--needs DAPT for at least 6 mo.   Procedure: LEFT HEART CATH AND CORONARY ANGIOGRAPHY;  Surgeon: Sherren Mocha, MD;  Location: [place] [facility];  Service: Cardiovascular;  Laterality: N/A;  . PARTIAL HIP ARTHROPLASTY Left 2010   left  . RENAL BIOPSY    . stents     05-2010 and 2- in 2010 and 1 in 2018.  [name] TOTAL HIP ARTHROPLASTY  07/09/2011   Procedure: TOTAL HIP ARTHROPLASTY;  Surgeon: Kerin Salen, MD;  Location: [place];  Service: Orthopedics;  Laterality: Right;  . TYMPANOSTOMY TUBE PLACEMENT  52 yrs old  . US ECHOCARDIOGRAPHY  12/2010; 02/2014;08/2014   02/2014 EF still 25-30%, increased PA pressures.  2016 EF 30-35%, sept/inf hypokinesis, mod tricusp regurg    Outpatient Medications Prior to Visit  Medication Sig Dispense Refill  . acetaminophen (TYLENOL) 500 MG tablet Take 1,000 mg by mouth every 6 (six) hours as needed for headache (pain).     [name] allopurinol (ZYLOPRIM) 100 MG tablet Take 1 tablet (100 mg total) by mouth daily. 90 tablet 3  . aspirin EC 81 MG tablet Take 81 mg by mouth daily.    [name] atorvastatin (LIPITOR) 80  MG tablet Take 80 mg by mouth at bedtime.    [name] BYSTOLIC 5 MG tablet TAKE 1 TABLET BY MOUTH DAILY 90 tablet 3  . clopidogrel (PLAVIX) 75 MG tablet TAKE 1 TABLET BY MOUTH DAILY 90 tablet 3  . DEXILANT 60 MG capsule TAKE 1 CAPSULE BY MOUTH DAILY BEFORE BREAKFAST 90 capsule 0  . losartan (COZAAR) 25 MG tablet Take 25 mg by mouth at bedtime.     . magnesium oxide (MAG-OX) 400 MG tablet Take 400 mg by mouth daily.    . mycophenolate (CELLCEPT) 250 MG capsule Take 3 capsules (750  mg total) by mouth 2 (two) times daily. 540 capsule 1  . niacin (NIASPAN) 500 MG CR tablet TAKE 2 TABLETS BY MOUTH AT BEDTIME 180 tablet 3  . nitroGLYCERIN (NITROSTAT) 0.4 MG SL tablet Place 0.4 mg under the tongue every 5 (five) minutes as needed for chest pain (DO NOT EXCEED 3 DOSES).    . predniSONE (DELTASONE) 5 MG tablet TAKE 1 TABLET BY MOUTH DAILY 90 tablet 3  . tacrolimus (PROGRAF) 0.5 MG capsule Take 0.5 mg by mouth 2 (two) times daily.     . traMADol (ULTRAM) 50 MG tablet Take 50 mg by mouth every 6 (six) hours as needed. for pain  0  . zolpidem (AMBIEN) 5 MG tablet Take 5 mg by mouth at bedtime as needed for sleep.    . ranitidine (ZANTAC) 150 MG tablet TAKE 1 TABLET BY MOUTH AT BEDTIME 90 tablet 0   No facility-administered medications prior to visit.     Allergies  Allergen Reactions  . Triptans Palpitations    Reaction to Maxalt    ROS As per HPI  PE: Blood pressure 127/74, pulse 65, temperature 98.1 F (36.7 C), temperature source Oral, resp. rate 16, height _0  (1.702 m), weight 231 lb 2 oz (104.8 kg), SpO2 98 %. Body mass index is 36.2 kg/m.  Gen: Alert, well appearing.  Patient is oriented to person, place, time, and situation. AFFECT: pleasant, lucid thought and speech. XFG:HWEX: no injection, icteris, swelling, or exudate.  EOMI, PERRLA. Mouth: lips without lesion/swelling.  Oral mucosa pink and moist. Oropharynx without erythema, exudate, or swelling.  CV: RRR, no m/r/g.   LUNGS: CTA  bilat, nonlabored resps, good aeration in all lung fields. Abd; soft, NT/ND EXT: no clubbing or cyanosis.  no edema.    LABS:  Lab Results  Component Value Date   TSH 0.52 09/03/2013   Lab Results  Component Value Date   WBC 8.7 07/23/2017   HGB 16.8 07/23/2017   HCT 48 07/23/2017   MCV 90.3 05/18/2017   PLT 163 07/23/2017   Lab Results  Component Value Date   CREATININE 1.3 07/23/2017   BUN 15 07/23/2017   NA 139 07/23/2017   K 4.5 07/23/2017   CL 103 05/18/2017   CO2 28 05/18/2017   Lab Results  Component Value Date   ALT 17 07/23/2017   AST 18 07/23/2017   ALKPHOS 113 07/23/2017   BILITOT 2.2 (H) 07/17/2016   Lab Results  Component Value Date   CHOL 123 07/23/2017   Lab Results  Component Value Date   HDL 69 07/23/2017   Lab Results  Component Value Date   LDLCALC 39 07/23/2017   Lab Results  Component Value Date   TRIG 75 07/23/2017   Lab Results  Component Value Date   CHOLHDL 2 02/27/2017   Lab Results  Component Value Date   PSA 1.71 10/23/2010   Lab Results  Component Value Date   HGBA1C 5.7 02/28/2017    IMPRESSION AND PLAN:  1) GERD: stable on qAM PPI and qPM zantac.  Due to zantac recall recently, I recommended he switch to pepcid 23m po qPM.  2) HTN: The current medical regimen is effective;  continue present plan and medications. Labs 01/07/18 at [facility] were great.  No recheck today.  3) Hypercholesterolemia: [place] upon [place] today [place] [place] Tolerating high dose statin.  4) Chronic renal insufficiency stage II/III (GFR 60s): stable labs 01/07/18.  5) Insomnia: doing fine.  RARE use of ambien.  No new rx needed for this med today.  An After Visit Summary was printed and given to the patient  FOLLOW UP: Return in about 3 months (around 06/03/2018) for annual CPE (fasting).  Signed:  Crissie Sickles, MD           03/09/2018

## 2018-03-05 ENCOUNTER — Ambulatory Visit (INDEPENDENT_AMBULATORY_CARE_PROVIDER_SITE_OTHER): Payer: Managed Care, Other (non HMO) | Admitting: *Deleted

## 2018-03-05 DIAGNOSIS — I255 Ischemic cardiomyopathy: Secondary | ICD-10-CM

## 2018-03-05 LAB — CUP PACEART REMOTE DEVICE CHECK
Date Time Interrogation Session: 20191022204247
HIGH POWER IMPEDANCE MEASURED VALUE: 304 Ohm
HighPow Impedance: 64 Ohm
HighPow Impedance: 78 Ohm
Implantable Lead Location: 753860
Implantable Lead Model: 6947
Lead Channel Pacing Threshold Amplitude: 1.5 V
Lead Channel Pacing Threshold Pulse Width: 0.4 ms
Lead Channel Sensing Intrinsic Amplitude: 14.875 mV
Lead Channel Setting Pacing Pulse Width: 0.4 ms
Lead Channel Setting Sensing Sensitivity: 0.3 mV
MDC IDC LEAD IMPLANT DT: 20120709
MDC IDC MSMT BATTERY VOLTAGE: 2.62 V
MDC IDC MSMT LEADCHNL RV IMPEDANCE VALUE: 399 Ohm
MDC IDC MSMT LEADCHNL RV SENSING INTR AMPL: 14.875 mV
MDC IDC PG IMPLANT DT: 20120709
MDC IDC SET LEADCHNL RV PACING AMPLITUDE: 3 V
MDC IDC STAT BRADY RV PERCENT PACED: 0.41 %

## 2018-03-05 NOTE — Progress Notes (Signed)
Remote ICD transmission.   

## 2018-03-12 ENCOUNTER — Encounter: Payer: Self-pay | Admitting: Nurse Practitioner

## 2018-03-12 ENCOUNTER — Ambulatory Visit: Payer: Managed Care, Other (non HMO) | Admitting: Nurse Practitioner

## 2018-03-12 VITALS — BP 126/76 | HR 61 | Ht 67.0 in | Wt 231.0 lb

## 2018-03-12 DIAGNOSIS — I5022 Chronic systolic (congestive) heart failure: Secondary | ICD-10-CM

## 2018-03-12 DIAGNOSIS — Z01812 Encounter for preprocedural laboratory examination: Secondary | ICD-10-CM | POA: Diagnosis not present

## 2018-03-12 DIAGNOSIS — I2510 Atherosclerotic heart disease of native coronary artery without angina pectoris: Secondary | ICD-10-CM | POA: Diagnosis not present

## 2018-03-12 DIAGNOSIS — Z940 Kidney transplant status: Secondary | ICD-10-CM | POA: Diagnosis not present

## 2018-03-12 DIAGNOSIS — I255 Ischemic cardiomyopathy: Secondary | ICD-10-CM

## 2018-03-12 LAB — CUP PACEART INCLINIC DEVICE CHECK
Implantable Lead Implant Date: 20120709
Implantable Pulse Generator Implant Date: 20120709
MDC IDC LEAD LOCATION: 753860
MDC IDC SESS DTM: 20191030082914

## 2018-03-12 NOTE — Progress Notes (Signed)
Electrophysiology Office Note Date: 03/12/2018  ID:  Santos, Sollenberger [birth year], MRN [id number]  PCP: [name], MD Primary Cardiologist: Johnsie Cancel Electrophysiologist: [name]  CC: Routine ICD follow-up  TIGRAN Dyer is a 52 y.o. male seen today for Dr [name].  He presents today for routine electrophysiology followup.  Since last being seen in our clinic, the patient reports doing very well. He continues with intermittent palpitations that last for up to 30 minutes at at time. He denies chest pain, dyspnea, PND, orthopnea, nausea, vomiting, dizziness, syncope, edema, weight gain, or early satiety.  He has not had ICD shocks.   Device History: MDT single chamber ICD implanted 2012 for ICM History of appropriate therapy: No History of AAD therapy: No  Past Medical History:  Diagnosis Date  . AICD (automatic cardioverter/defibrillator) present    Dr. Paschal Dopp follows remotely-yrly checks, Dr. Jonne Ply  . Avascular necrosis of bone of hip ([place]) 2010 surg   Left hip arthroplasty: from chronic systemic steroids taken for his Lupus  . Barrett'Roy esophagus   . Blood transfusion without reported diagnosis 2010, 2012  . CAD (coronary artery disease)    a. stents RCA/Circ 2001 b. BMS to LAD 07/2010  c. 01/2017: Roy/p DES to RCA.   . Cataract    left eye  . CHF (congestive heart failure) ([place])   . Chronic headaches    Dr. [name] 06/2016: suspects tension HA'Roy.  CT brain was considered (MRI was not approved by insurer) but ended up not being needed as of 09/2016.  His neurontin was increased to 600 mg bid but this caused hyperkalemia so he d/c'd this med.  HA'Roy improved with decre caffeine and incre water.   Also got referred for eval for possible OSA.  Renal MD suspicous that his polycyt may be cause HAs..  . Chronic headaches 02/2017   As of 01/2017, HA'Roy no better, neurologist ordered CT.  ESR normal at that time.  . Chronic renal insufficiency, stage II (mild)    GFR 65 ml/min  07/19/15 at local renal f/u (Cr 1.29).  GFR 64 ml/min (cr 1.3) at local renal f/u 01/16/17.  [name] GERD (gastroesophageal reflux disease)    Hx of esoph stricture and dilatation.  +Barrett'Roy esophagus+hx of aspiration pneumonia-->to get rpt EGD when he comes off DAPT (utd as of 01/29/18)  . Gout    Roy/p renal transplant he was weened off of his allopurinol.  . Hiatal hernia   . 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 History of renal transplant 11/10/2012   10/2012 [facility]   . Hyperlipidemia   . Hypertension   . IFG (impaired fasting glucose)   . implantable cardiac defibrillator single chamber    Medtronic (due to low EF)  . Ischemic cardiomyopathy    Chronic systolic dysfunction--  37/4827 EF 30-35%.   Single chamber ICD 11/20/10 ([name])  . Left ventricular thrombus 2012   Re-eval 02/2011 showed thrombus RESOLVED, so coumadin was d/c'd (was on it for 66mo  . Lupus ([place]   . OSA on CPAP 2018   Dr. [name]9/2018: +OSA on home sleep studay; CPAP auto titrate 5-20 started 05/2017.  . Osteoporosis   . Post-transplant erythrocytosis    improved with ARB (valsartan)   Past Surgical History:  Procedure Laterality Date  . AV FISTULA PLACEMENT  03/28/2011   Procedure: ARTERIOVENOUS (AV) FISTULA CREATION;  Surgeon: TRosetta Posner MD;  [facility];  Service: Vascular;  Laterality: Left;  Creation of Left radiocephallic cimino fistula  . AV FISTULA PLACEMENT  04/27/2011   Procedure: ARTERIOVENOUS (AV) FISTULA CREATION;  Surgeon: Rosetta Posner, MD;  Location: [place];  Service: Vascular;  Laterality: Left;  left basilic vein transposition  . CARDIAC CATHETERIZATION     08/2010  . CARDIOVASCULAR STRESS TEST  07/2010; 03/2014   03/2014 showed large old infarct and EF 30-35% but no ischemia  . DEXA  07/05/2017   Normal bone density in radius and spine.  . ESOPHAGOGASTRODUODENOSCOPY  02/2009   Done by Dr. Fuller Plan for hematemesis;  esophagitis found.  Repeat recommended 09/2016, at which time he will also likely get his initial screening colonoscopy.  . ESOPHAGOGASTRODUODENOSCOPY (EGD) WITH PROPOFOL N/A 09/29/2015   REPEAT EGD RECOMMENDED 09/2016.  Barrett'Roy esoph + mild chronic gastritis.  H pylori NEG. Procedure: ESOPHAGOGASTRODUODENOSCOPY (EGD) WITH PROPOFOL;  Surgeon: Jerene Bears, MD;  [facility];  Service: Gastroenterology;  Laterality: N/A;  . INSERT / REPLACE / REMOVE PACEMAKER     medtronic        dr [name]    [place]   icd only  . KIDNEY TRANSPLANT  10/2012   [facility] nephrologist--Dr. Blair Heys  . LEFT HEART CATH AND CORONARY ANGIOGRAPHY N/A 02/08/2017   PCA and DES to mRCA--needs DAPT for at least 6 mo.   Procedure: LEFT HEART CATH AND CORONARY ANGIOGRAPHY;  Surgeon: Sherren Mocha, MD;  Location: [place] [facility];  Service: Cardiovascular;  Laterality: N/A;  . PARTIAL HIP ARTHROPLASTY Left 2010   left  . RENAL BIOPSY    . stents     05-2010 and 2- in 2010 and 1 in 2018.  [name] TOTAL HIP ARTHROPLASTY  07/09/2011   Procedure: TOTAL HIP ARTHROPLASTY;  Surgeon: Kerin Salen, MD;  Location: [place];  Service: Orthopedics;  Laterality: Right;  . TYMPANOSTOMY TUBE PLACEMENT  52 yrs old  . US ECHOCARDIOGRAPHY  12/2010; 02/2014;08/2014   02/2014 EF still 25-30%, increased PA pressures.  2016 EF 30-35%, sept/inf hypokinesis, mod tricusp regurg    Current Outpatient Medications  Medication Sig Dispense Refill  . acetaminophen (TYLENOL) 500 MG tablet Take 1,000 mg by mouth every 6 (six) hours as needed for headache (pain).     [name] allopurinol (ZYLOPRIM) 100 MG tablet Take 1 tablet (100 mg total) by mouth daily. 90 tablet 3  . aspirin EC 81 MG tablet Take 81 mg by mouth daily.    [name] atorvastatin (LIPITOR) 80 MG tablet Take 80 mg by mouth at bedtime.    [name] BYSTOLIC 5 MG tablet TAKE 1 TABLET BY MOUTH DAILY 90 tablet 3  . clopidogrel (PLAVIX) 75 MG tablet TAKE 1 TABLET BY MOUTH DAILY 90 tablet 3  . DEXILANT 60 MG  capsule TAKE 1 CAPSULE BY MOUTH DAILY BEFORE BREAKFAST 90 capsule 0  . famotidine (PEPCID) 20 MG tablet 1 tab po qhs 30 tablet 6  . losartan (COZAAR) 25 MG tablet Take 25 mg by mouth at bedtime.     . magnesium oxide (MAG-OX) 400 MG tablet Take 400 mg by mouth daily.    . mycophenolate (CELLCEPT) 250 MG capsule Take 3 capsules (750 mg total) by mouth 2 (two) times daily. 540 capsule 1  . niacin (NIASPAN) 500 MG CR tablet TAKE 2 TABLETS BY MOUTH AT BEDTIME 180 tablet 3  . nitroGLYCERIN (NITROSTAT) 0.4 MG SL tablet Place 0.4 mg under the tongue every 5 (five) minutes as  needed for chest pain (DO NOT EXCEED 3 DOSES).    . predniSONE (DELTASONE) 5 MG tablet TAKE 1 TABLET BY MOUTH DAILY 90 tablet 3  . tacrolimus (PROGRAF) 0.5 MG capsule Take 0.5 mg by mouth 2 (two) times daily.     . traMADol (ULTRAM) 50 MG tablet Take 50 mg by mouth every 6 (six) hours as needed. for pain  0  . zolpidem (AMBIEN) 5 MG tablet Take 5 mg by mouth at bedtime as needed for sleep.     No current facility-administered medications for this visit.     Allergies:   Triptans   Social History: Social History   Socioeconomic History  . Marital status: Married    Spouse name: Not on file  . Number of children: Not on file  . Years of education: Not on file  . Highest education level: Not on file  Occupational History  . Occupation: CAD Drafter     Employer: [employer]  Social Needs  . Financial resource strain: Not on file  . Food insecurity:    Worry: Not on file    Inability: Not on file  . Transportation needs:    Medical: Not on file    Non-medical: Not on file  Tobacco Use  . Smoking status: Former Smoker    Packs/day: 0.50    Years: 30.00    Pack years: 15.00    Types: Cigarettes    Last attempt to quit: 08/02/2010    Years since quitting: 7.6  . Smokeless tobacco: Never Used  Substance and Sexual Activity  . Alcohol use: No    Alcohol/week: 0.0 standard drinks  . Drug use: No  . Sexual activity: Yes      Partners: Female  Lifestyle  . Physical activity:    Days per week: Not on file    Minutes per session: Not on file  . Stress: Not on file  Relationships  . Social connections:    Talks on phone: Not on file    Gets together: Not on file    Attends religious service: Not on file    Active member of club or organization: Not on file    Attends meetings of clubs or organizations: Not on file    Relationship status: Not on file  . Intimate partner violence:    Fear of current or ex partner: Not on file    Emotionally abused: Not on file    Physically abused: Not on file    Forced sexual activity: Not on file  Other Topics Concern  . Not on file  Social History Narrative   Married, 1 teenage son and 1 teenage daughter.    Occupation: Printmaker.   15 pack-yr smoking hx, quit 07/2010.   Drug Use - no   No alcohol.             Family History: Family History  Problem Relation Age of Onset  . Kidney failure Mother   . Lupus Mother   . Stroke Mother   . Diabetes Mother        type 2  . Heart attack Father        X 7  . Hypertension Father   . Hyperlipidemia Father   . Heart attack Sister 39       X 1  . Hyperlipidemia Sister   . Hypertension Sister   . Heart disease Sister   . Heart attack Paternal Grandfather   . Heart disease Paternal Aunt   .  Heart disease Paternal Uncle   . Colon cancer Neg Hx   . Esophageal cancer Neg Hx   . Stomach cancer Neg Hx   . Rectal cancer Neg Hx     Review of Systems: All other systems reviewed and are otherwise negative except as noted above.   Physical Exam: VS:  BP 126/76   Pulse 61   Ht 5' 7"  (1.702 m)   Wt 231 lb (104.8 kg)   SpO2 96%   BMI 36.18 kg/m  , BMI Body mass index is 36.18 kg/m.  GEN- The patient is well appearing, alert and oriented x 3 today.   HEENT: normocephalic, atraumatic; sclera clear, conjunctiva pink; hearing intact; oropharynx clear; neck supple  Lungs- Clear to ausculation  bilaterally, normal work of breathing.  No wheezes, rales, rhonchi Heart- Regular rate and rhythm  GI- soft, non-tender, non-distended, bowel sounds present  Extremities- no clubbing, cyanosis, or edema MS- no significant deformity or atrophy Skin- warm and dry, no rash or lesion; ICD pocket well healed Psych- euthymic mood, full affect Neuro- strength and sensation are intact  ICD interrogation- reviewed in detail today,  See PACEART report  EKG:  EKG is ordered today. EKG today demonstrates sinus rhythm, rate 61, 1st degree AV block (PR 258mec), QRS 912mc, QTc 41038m  Recent Labs: 07/23/2017: ALT 17; BUN 15; Creatinine 1.3; Hemoglobin 16.8; Platelets 163; Potassium 4.5; Sodium 139   Wt Readings from Last 3 Encounters:  03/12/18 231 lb (104.8 kg)  03/03/18 231 lb 2 oz (104.8 kg)  02/03/18 231 lb 8 oz (105 kg)     Other studies Reviewed: Additional studies/ records that were reviewed today include: Dr KleCaryl Comesd Dr NisKyla Balzarinefice notes   Assessment and Plan:  1.  Chronic systolic dysfunction euvolemic today Stable on an appropriate medical regimen See Pace Art report Battery voltage 2.62V today (RRT 2.63V).  Risks, benefits to ICD gen change reviewed with patient who wishes to proceed. Will plan in a few weeks with Dr KleCaryl Comesold Plavix for 2 days prior to procedure (per Dr NisKyla Balzarinete, ok to hold for colonoscopy as well)   2.  CAD No recent ischemic symptoms Continue medical therapy  3.  CKD with prior renal transplant Followed by Dr GolMoshe Ciprourrent medicines are reviewed at length with the patient today.   The patient does not have concerns regarding his medicines.  The following changes were made today:  none  Labs/ tests ordered today include: pre-procedure labs  Orders Placed This Encounter  Procedures  . CBC  . Basic metabolic panel  . CUP P[facility]ART [place] EKG 12-Lead     Disposition:   Follow up with [facility], Dr NisJohnsie Cancel  scheduled, Dr KleCaryl Comester gen change    Signed, AmbChanetta MarshallP 03/12/2018 9:23 AM  [place] [phone]) [phone])

## 2018-03-12 NOTE — Patient Instructions (Signed)
Medication Instructions:   If you need a refill on your cardiac medications before your next appointment, please call your pharmacy.   Lab work:  If you have labs (blood work) drawn today and your tests are completely normal, you will receive your results only by: [name] MyChart Message (if you have MyChart) OR . A paper copy in the mail If you have any lab test that is abnormal or we need to change your treatment, we will call you to review the results.  Testing/Procedures: Your physician has recommended that you have a Generator Change.  A pacemaker is a small device that is placed under the skin of your chest or abdomen to help control abnormal heart rhythms. This device uses electrical pulses to prompt the heart to beat at a normal rate. Pacemakers are used to treat heart rhythms that are too slow. Wire (leads) are attached to the pacemaker that goes into the chambers of you heart. This is done in the hospital and usually requires and overnight stay. Please see the instruction sheet given to you today for more information.    Follow-Up: TBD  Any Other Special Instructions Will Be Listed Below (If Applicable). Generator Change Hold Plavix 2 days before Wound check 7-10 days afterwards    [facility] [place], [place] [address] Dept: [phone] Loc: [place]  03/12/2018  You are scheduled for a Generator change on Friday, November 29 with [name]  1. Please arrive at the [facility] (Main Entrance A) at [facility]: [address] [place], [place] [address] at 5:30 AM (This time is two hours before your procedure to ensure your preparation). Free valet parking service is available.   Special note: Every effort is made to have your procedure done on time. Please understand that emergencies sometimes delay scheduled procedures. 2. Diet: Do not eat  solid foods after midnight.  The patient may have clear liquids until 5am upon the day of the procedure.  3. Labs: You will need to have blood drawn on November 15 at [facility] [address]  Open: 7:30am - 5pm    Phone: [phone]. You do not need to be fasting.  4. Medication instructions in preparation for your procedure: HOLD PLAVIX November 26-28  5. Plan for one night stay--bring personal belongings. 6. Bring a current list of your medications and current insurance cards. 7. You MUST have a responsible person to drive you home. 8. Someone MUST be with you the first 24 hours after you arrive home or your discharge will be delayed. 9. Please wear clothes that are easy to get on and off and wear slip-on shoes.  Thank you for allowing [place] to care for you!   -- [place] [facility]

## 2018-03-28 ENCOUNTER — Other Ambulatory Visit: Payer: Managed Care, Other (non HMO) | Admitting: *Deleted

## 2018-03-28 DIAGNOSIS — Z01812 Encounter for preprocedural laboratory examination: Secondary | ICD-10-CM

## 2018-03-28 DIAGNOSIS — I255 Ischemic cardiomyopathy: Secondary | ICD-10-CM

## 2018-03-28 DIAGNOSIS — Z940 Kidney transplant status: Secondary | ICD-10-CM

## 2018-03-28 DIAGNOSIS — I2510 Atherosclerotic heart disease of native coronary artery without angina pectoris: Secondary | ICD-10-CM

## 2018-03-28 DIAGNOSIS — I5022 Chronic systolic (congestive) heart failure: Secondary | ICD-10-CM

## 2018-03-28 LAB — CBC
HEMATOCRIT: 48 % (ref 37.5–51.0)
HEMOGLOBIN: 16.2 g/dL (ref 13.0–17.7)
MCH: 30.4 pg (ref 26.6–33.0)
MCHC: 33.8 g/dL (ref 31.5–35.7)
MCV: 90 fL (ref 79–97)
Platelets: 193 10*3/uL (ref 150–450)
RBC: 5.33 x10E6/uL (ref 4.14–5.80)
RDW: 12.9 % (ref 12.3–15.4)
WBC: 9.3 10*3/uL (ref 3.4–10.8)

## 2018-03-28 LAB — BASIC METABOLIC PANEL
BUN/Creatinine Ratio: 11 (ref 9–20)
BUN: 15 mg/dL (ref 6–24)
CALCIUM: 9.5 mg/dL (ref 8.7–10.2)
CHLORIDE: 101 mmol/L (ref 96–106)
CO2: 26 mmol/L (ref 20–29)
CREATININE: 1.35 mg/dL — AB (ref 0.76–1.27)
GFR calc Af Amer: 70 mL/min/{1.73_m2} (ref >59)
GFR calc non Af Amer: 60 mL/min/{1.73_m2} (ref >59)
GLUCOSE: 119 mg/dL — AB (ref 65–99)
Potassium: 5 mmol/L (ref 3.5–5.2)
Sodium: 141 mmol/L (ref 134–144)

## 2018-03-31 ENCOUNTER — Telehealth: Payer: Self-pay

## 2018-03-31 NOTE — Telephone Encounter (Signed)
Notes recorded by Frederik Schmidt, RN on 03/31/2018 at 8:49 AM EST The patient has been notified of the result and verbalized understanding. All questions (if any) were answered. Frederik Schmidt, RN 03/31/2018 8:49 AM

## 2018-03-31 NOTE — Telephone Encounter (Signed)
-----   Message from Patsey Berthold, NP sent at 03/29/2018  4:07 PM EST ----- Please notify patient of lab results.  K+ borderline high, avoid K+ rich foods. Thanks!

## 2018-04-07 ENCOUNTER — Ambulatory Visit (INDEPENDENT_AMBULATORY_CARE_PROVIDER_SITE_OTHER): Payer: Managed Care, Other (non HMO)

## 2018-04-07 DIAGNOSIS — I255 Ischemic cardiomyopathy: Secondary | ICD-10-CM

## 2018-04-07 NOTE — Progress Notes (Signed)
Remote ICD transmission.   

## 2018-04-11 ENCOUNTER — Other Ambulatory Visit: Payer: Self-pay

## 2018-04-11 ENCOUNTER — Encounter (HOSPITAL_COMMUNITY): Payer: Self-pay | Admitting: Internal Medicine

## 2018-04-11 ENCOUNTER — Ambulatory Visit (HOSPITAL_COMMUNITY)
Admission: RE | Admit: 2018-04-11 | Discharge: 2018-04-11 | Disposition: A | Discharge status: Home / Self Care | Payer: Managed Care, Other (non HMO) | Source: Ambulatory Visit | Attending: Internal Medicine | Admitting: Internal Medicine

## 2018-04-11 ENCOUNTER — Ambulatory Visit (HOSPITAL_COMMUNITY)
Admission: RE | Disposition: A | Discharge status: Home / Self Care | Payer: Self-pay | Source: Ambulatory Visit | Attending: Internal Medicine

## 2018-04-11 DIAGNOSIS — Z955 Presence of coronary angioplasty implant and graft: Secondary | ICD-10-CM | POA: Diagnosis not present

## 2018-04-11 DIAGNOSIS — Z8249 Family history of ischemic heart disease and other diseases of the circulatory system: Secondary | ICD-10-CM | POA: Insufficient documentation

## 2018-04-11 DIAGNOSIS — I472 Ventricular tachycardia: Secondary | ICD-10-CM | POA: Diagnosis not present

## 2018-04-11 DIAGNOSIS — I2510 Atherosclerotic heart disease of native coronary artery without angina pectoris: Secondary | ICD-10-CM | POA: Insufficient documentation

## 2018-04-11 DIAGNOSIS — K219 Gastro-esophageal reflux disease without esophagitis: Secondary | ICD-10-CM | POA: Insufficient documentation

## 2018-04-11 DIAGNOSIS — Z87891 Personal history of nicotine dependence: Secondary | ICD-10-CM | POA: Insufficient documentation

## 2018-04-11 DIAGNOSIS — Z4502 Encounter for adjustment and management of automatic implantable cardiac defibrillator: Secondary | ICD-10-CM | POA: Diagnosis not present

## 2018-04-11 DIAGNOSIS — Z7982 Long term (current) use of aspirin: Secondary | ICD-10-CM | POA: Diagnosis not present

## 2018-04-11 DIAGNOSIS — M1990 Unspecified osteoarthritis, unspecified site: Secondary | ICD-10-CM | POA: Insufficient documentation

## 2018-04-11 DIAGNOSIS — Z940 Kidney transplant status: Secondary | ICD-10-CM | POA: Diagnosis not present

## 2018-04-11 DIAGNOSIS — I255 Ischemic cardiomyopathy: Secondary | ICD-10-CM

## 2018-04-11 DIAGNOSIS — M109 Gout, unspecified: Secondary | ICD-10-CM | POA: Diagnosis not present

## 2018-04-11 DIAGNOSIS — Z823 Family history of stroke: Secondary | ICD-10-CM | POA: Insufficient documentation

## 2018-04-11 DIAGNOSIS — M879 Osteonecrosis, unspecified: Secondary | ICD-10-CM | POA: Insufficient documentation

## 2018-04-11 DIAGNOSIS — R51 Headache: Secondary | ICD-10-CM | POA: Insufficient documentation

## 2018-04-11 DIAGNOSIS — E785 Hyperlipidemia, unspecified: Secondary | ICD-10-CM | POA: Diagnosis not present

## 2018-04-11 DIAGNOSIS — Z960 Presence of urogenital implants: Secondary | ICD-10-CM | POA: Diagnosis not present

## 2018-04-11 DIAGNOSIS — Z006 Encounter for examination for normal comparison and control in clinical research program: Secondary | ICD-10-CM | POA: Insufficient documentation

## 2018-04-11 DIAGNOSIS — I5022 Chronic systolic (congestive) heart failure: Secondary | ICD-10-CM | POA: Diagnosis not present

## 2018-04-11 DIAGNOSIS — Z96643 Presence of artificial hip joint, bilateral: Secondary | ICD-10-CM | POA: Diagnosis not present

## 2018-04-11 DIAGNOSIS — Z888 Allergy status to other drugs, medicaments and biological substances status: Secondary | ICD-10-CM | POA: Insufficient documentation

## 2018-04-11 DIAGNOSIS — K2270 Barrett's esophagus without dysplasia: Secondary | ICD-10-CM | POA: Diagnosis not present

## 2018-04-11 DIAGNOSIS — I132 Hypertensive heart and chronic kidney disease with heart failure and with stage 5 chronic kidney disease, or end stage renal disease: Secondary | ICD-10-CM | POA: Diagnosis not present

## 2018-04-11 DIAGNOSIS — G4733 Obstructive sleep apnea (adult) (pediatric): Secondary | ICD-10-CM | POA: Insufficient documentation

## 2018-04-11 DIAGNOSIS — Z79899 Other long term (current) drug therapy: Secondary | ICD-10-CM | POA: Diagnosis not present

## 2018-04-11 DIAGNOSIS — Z7902 Long term (current) use of antithrombotics/antiplatelets: Secondary | ICD-10-CM | POA: Insufficient documentation

## 2018-04-11 DIAGNOSIS — Z841 Family history of disorders of kidney and ureter: Secondary | ICD-10-CM | POA: Insufficient documentation

## 2018-04-11 DIAGNOSIS — N186 End stage renal disease: Secondary | ICD-10-CM | POA: Insufficient documentation

## 2018-04-11 DIAGNOSIS — Z9889 Other specified postprocedural states: Secondary | ICD-10-CM | POA: Insufficient documentation

## 2018-04-11 HISTORY — PX: ICD GENERATOR CHANGEOUT: EP1231

## 2018-04-11 LAB — SURGICAL PCR SCREEN
MRSA, PCR: NEGATIVE
STAPHYLOCOCCUS AUREUS: POSITIVE — AB

## 2018-04-11 SURGERY — ICD GENERATOR CHANGEOUT

## 2018-04-11 MED ORDER — SODIUM CHLORIDE 0.9 % IV SOLN: INTRAVENOUS | Status: AC

## 2018-04-11 MED ORDER — MUPIROCIN 2 % EX OINT
TOPICAL_OINTMENT | CUTANEOUS | Status: AC
  Administered 2018-04-11: 1 via TOPICAL
  Filled 2018-04-11: qty 22

## 2018-04-11 MED ORDER — SODIUM CHLORIDE 0.9 % IV SOLN
INTRAVENOUS | Status: DC
  Administered 2018-04-11: 07:00:00 via INTRAVENOUS

## 2018-04-11 MED ORDER — SODIUM CHLORIDE 0.9 % IV SOLN
80.0000 mg | INTRAVENOUS | Status: AC
  Administered 2018-04-11: 80 mg
  Filled 2018-04-11: qty 2

## 2018-04-11 MED ORDER — LIDOCAINE HCL (PF) 1 % IJ SOLN
INTRAMUSCULAR | Status: DC | PRN
  Administered 2018-04-11: 40 mL

## 2018-04-11 MED ORDER — CHLORHEXIDINE GLUCONATE 4 % EX LIQD
60.0000 mL | Freq: Once | CUTANEOUS | Status: DC
  Filled 2018-04-11: qty 60

## 2018-04-11 MED ORDER — CEFAZOLIN SODIUM-DEXTROSE 2-4 GM/100ML-% IV SOLN
2.0000 g | INTRAVENOUS | Status: AC
  Administered 2018-04-11: 2 g via INTRAVENOUS

## 2018-04-11 MED ORDER — ONDANSETRON HCL 4 MG/2ML IJ SOLN: 4.0000 mg | Freq: Four times a day (QID) | INTRAMUSCULAR | Status: DC | PRN

## 2018-04-11 MED ORDER — MIDAZOLAM HCL 5 MG/5ML IJ SOLN
INTRAMUSCULAR | Status: AC
  Filled 2018-04-11: qty 5

## 2018-04-11 MED ORDER — LIDOCAINE HCL (PF) 1 % IJ SOLN
INTRAMUSCULAR | Status: AC
  Filled 2018-04-11: qty 120

## 2018-04-11 MED ORDER — FENTANYL CITRATE (PF) 100 MCG/2ML IJ SOLN
INTRAMUSCULAR | Status: AC
  Filled 2018-04-11: qty 2

## 2018-04-11 MED ORDER — MIDAZOLAM HCL 5 MG/5ML IJ SOLN
INTRAMUSCULAR | Status: DC | PRN
  Administered 2018-04-11: 2 mg via INTRAVENOUS
  Administered 2018-04-11 (×2): 1 mg via INTRAVENOUS

## 2018-04-11 MED ORDER — FENTANYL CITRATE (PF) 100 MCG/2ML IJ SOLN
INTRAMUSCULAR | Status: DC | PRN
  Administered 2018-04-11 (×2): 25 ug via INTRAVENOUS
  Administered 2018-04-11: 50 ug via INTRAVENOUS

## 2018-04-11 MED ORDER — CEFAZOLIN SODIUM-DEXTROSE 2-4 GM/100ML-% IV SOLN
INTRAVENOUS | Status: AC
  Filled 2018-04-11: qty 100

## 2018-04-11 MED ORDER — ACETAMINOPHEN 325 MG PO TABS
325.0000 mg | ORAL_TABLET | ORAL | Status: DC | PRN
  Filled 2018-04-11: qty 2

## 2018-04-11 MED ORDER — MUPIROCIN 2 % EX OINT
1.0000 "application " | TOPICAL_OINTMENT | Freq: Once | CUTANEOUS | Status: AC
  Administered 2018-04-11: 1 via TOPICAL
  Filled 2018-04-11: qty 22

## 2018-04-11 MED ORDER — SODIUM CHLORIDE 0.9 % IV SOLN
INTRAVENOUS | Status: AC
  Filled 2018-04-11: qty 2

## 2018-04-11 SURGICAL SUPPLY — 4 items
CABLE SURGICAL S-101-97-12 (CABLE) ×2 IMPLANT
ICD VISIA MRI DVFB1D1 (ICD Generator) ×2 IMPLANT
PAD PRO RADIOLUCENT 2001M-C (PAD) ×2 IMPLANT
TRAY PACEMAKER INSERTION (PACKS) ×2 IMPLANT

## 2018-04-11 NOTE — Discharge Instructions (Signed)

## 2018-04-11 NOTE — Interval H&P Note (Signed)
ICD Criteria  Current LVEF:30. Within 12 months prior to implant: No   Heart failure history: Yes, Class I  Cardiomyopathy history: Yes, Ischemic Cardiomyopathy - Prior MI.  Atrial Fibrillation/Atrial Flutter: No.  Ventricular tachycardia history: Yes, No hemodynamic instability. VT Type: Non-Sustained Ventricular Tachycardia.  Cardiac arrest history: No.  History of syndromes with risk of sudden death: No.  Previous ICD: Yes, Reason for ICD:  Primary prevention.  Current ICD indication: Primary  PPM indication: No.  Class I or II Bradycardia indication present: No  Beta Blocker therapy for 3 or more months: Yes, prescribed.   Ace Inhibitor/ARB therapy for 3 or more months: Yes, prescribed.    I have seen Roy Dyer is a 52 y.o. malep  for consideration of ICD generator re-implant for  Primary  prevention of sudden death.  The patient's chart has been reviewed and they meet criteria for ICD implant.  I have had a thorough discussion with the patient reviewing options.  The patient and their family (if available) have had opportunities to ask questions and have them answered. The patient and I have decided together through the [employer] to reimplant ICD at this time.  Risks, benefits, alternatives to ICD implantation were discussed in detail with the patient today. The patient  understands that the risks include but are not limited to bleeding, infection, pneumothorax, perforation, tamponade, vascular damage, renal failure, MI, stroke, death, inappropriate shocks, and lead dislodgement and  wishes to proceed.  History and Physical Interval Note:  04/11/2018 7:40 AM  Roy Dyer  has presented today for surgery, with the diagnosis of ERI  The various methods of treatment have been discussed with the patient and family. After consideration of risks, benefits and other options for treatment, the patient has consented to  Procedure(s): ICD [place] (N/A) as a surgical intervention .  The patient's history has been reviewed, patient examined, no change in status, stable for surgery.  I have reviewed the patient's chart and labs.  Questions were answered to the patient's satisfaction.     Virl Axe

## 2018-04-11 NOTE — H&P (Signed)
Patient Care Team: [name], MD as PCP - General (Family Medicine) Josue Hector, MD as Consulting Physician (Cardiology) Deboraha Sprang, MD as Consulting Physician (Cardiology) Corliss Parish, MD as Consulting Physician (Nephrology) Early, Arvilla Meres, MD as Consulting Physician (Vascular Surgery) Lowella Bandy, MD as Consulting Physician (Urology) Pyrtle, Lajuan Lines, MD as Consulting Physician (Gastroenterology) Pieter Partridge, DO as Consulting Physician (Neurology) Deneise Lever, MD as Consulting Physician (Pulmonary Disease) Frederik Pear, MD as Consulting Physician (Orthopedic Surgery)   HPI  Roy Dyer is a 52 y.o. male here for generator replacement for ICD implanted for primary prevention in setting of ischemic cardiomyopathy  LHC 9/18 2.2 exertional angina; mild ISR LAD and Cx stents, and new RCA stent placed   Long term DAPT recommended   Vascular injury accelerated by Lupus  MRI 7/12 demonstrated EF 20%   Echocardiogram 3/13 EF 35% LVH.   The patient denies chest pain, shortness of breath, nocturnal dyspnea, orthopnea or peripheral edema.  There have been no palpitations, lightheadedness or syncope.    Records and Results Reviewed As above   Past Medical History:  Diagnosis Date  . AICD (automatic cardioverter/defibrillator) present    Dr. Paschal Dopp follows remotely-yrly checks, Dr. Jonne Ply  . Avascular necrosis of bone of hip ([place]) 2010 surg   Left hip arthroplasty: from chronic systemic steroids taken for his Lupus  . Barrett's esophagus   . Blood transfusion without reported diagnosis 2010, 2012  . CAD (coronary artery disease)    a. stents RCA/Circ 2001 b. BMS to LAD 07/2010  c. 01/2017: s/p DES to RCA.   . Cataract    left eye  . CHF (congestive heart failure) ([place])   . Chronic headaches    Dr. [name] 06/2016: suspects tension HA's.  CT brain was considered (MRI was not approved by insurer) but ended up not being needed as of 09/2016.   His neurontin was increased to 600 mg bid but this caused hyperkalemia so he d/c'd this med.  HA's improved with decre caffeine and incre water.   Also got referred for eval for possible OSA.  Renal MD suspicous that his polycyt may be cause HAs..  . Chronic headaches 02/2017   As of 01/2017, HA's no better, neurologist ordered CT.  ESR normal at that time.  . Chronic renal insufficiency, stage II (mild)    GFR 65 ml/min 07/19/15 at local renal f/u (Cr 1.29).  GFR 64 ml/min (cr 1.3) at local renal f/u 01/16/17.  [name] GERD (gastroesophageal reflux disease)    Hx of esoph stricture and dilatation.  +Barrett's esophagus+hx of aspiration pneumonia-->to get rpt EGD when he comes off DAPT (utd as of 01/29/18)  . Gout    s/p renal transplant he was weened off of his allopurinol.  . Hiatal hernia   . 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 History of renal transplant 11/10/2012   10/2012 [facility]   . Hyperlipidemia   . Hypertension   . IFG (impaired fasting glucose)   . implantable cardiac defibrillator single chamber    Medtronic (due to low EF)  . Ischemic cardiomyopathy    Chronic systolic dysfunction--  50/5697 EF 30-35%.   Single chamber ICD 11/20/10 ([name])  . Left ventricular thrombus 2012   Re-eval 02/2011 showed thrombus RESOLVED, so coumadin was d/c'd (was on it for 10mo  . [place]   .  OSA on CPAP 2018   Dr. Annamaria Boots 01/2017: +OSA on home sleep studay; CPAP auto titrate 5-20 started 05/2017.  . Osteoporosis   . Post-transplant erythrocytosis    improved with ARB (valsartan)    Past Surgical History:  Procedure Laterality Date  . AV FISTULA PLACEMENT  03/28/2011   Procedure: ARTERIOVENOUS (AV) FISTULA CREATION;  Surgeon: Rosetta Posner, MD;  Location: [facility];  Service: Vascular;  Laterality: Left;  Creation of Left radiocephallic cimino fistula  . AV FISTULA PLACEMENT  04/27/2011   Procedure: ARTERIOVENOUS (AV) FISTULA  CREATION;  Surgeon: Rosetta Posner, MD;  Location: [place];  Service: Vascular;  Laterality: Left;  left basilic vein transposition  . CARDIAC CATHETERIZATION     08/2010  . CARDIOVASCULAR STRESS TEST  07/2010; 03/2014   03/2014 showed large old infarct and EF 30-35% but no ischemia  . DEXA  07/05/2017   Normal bone density in radius and spine.  . ESOPHAGOGASTRODUODENOSCOPY  02/2009   Done by Dr. Fuller Plan for hematemesis; esophagitis found.  Repeat recommended 09/2016, at which time he will also likely get his initial screening colonoscopy.  . ESOPHAGOGASTRODUODENOSCOPY (EGD) WITH PROPOFOL N/A 09/29/2015   REPEAT EGD RECOMMENDED 09/2016.  Barrett's esoph + mild chronic gastritis.  H pylori NEG. Procedure: ESOPHAGOGASTRODUODENOSCOPY (EGD) WITH PROPOFOL;  Surgeon: Jerene Bears, MD;  Location: [facility];  Service: Gastroenterology;  Laterality: N/A;  . INSERT / REPLACE / REMOVE PACEMAKER     medtronic        dr [name]    [place]   icd only  . KIDNEY TRANSPLANT  10/2012   [facility] nephrologist--Dr. Blair Heys  . LEFT HEART CATH AND CORONARY ANGIOGRAPHY N/A 02/08/2017   PCA and DES to mRCA--needs DAPT for at least 6 mo.   Procedure: LEFT HEART CATH AND CORONARY ANGIOGRAPHY;  Surgeon: Sherren Mocha, MD;  Location: [facility];  Service: Cardiovascular;  Laterality: N/A;  . PARTIAL HIP ARTHROPLASTY Left 2010   left  . RENAL BIOPSY    . stents     05-2010 and 2- in 2010 and 1 in 2018.  [name] TOTAL HIP ARTHROPLASTY  07/09/2011   Procedure: TOTAL HIP ARTHROPLASTY;  Surgeon: Kerin Salen, MD;  Location: [facility];  Service: Orthopedics;  Laterality: Right;  . TYMPANOSTOMY TUBE PLACEMENT  52 yrs old  . US ECHOCARDIOGRAPHY  12/2010; 02/2014;08/2014   02/2014 EF still 25-30%, increased PA pressures.  2016 EF 30-35%, sept/inf hypokinesis, mod tricusp regurg    Current Facility-Administered Medications  Medication Dose Route Frequency Provider Last Rate Last Dose  . 0.9 %  sodium chloride infusion   Intravenous  Continuous Deboraha Sprang, MD 50 mL/hr at 04/11/18 0630    . ceFAZolin (ANCEF) IVPB 2g/100 mL premix  2 g Intravenous On Call Deboraha Sprang, MD      . chlorhexidine (HIBICLENS) 4 % liquid 4 application  60 mL Topical Once Deboraha Sprang, MD      . gentamicin (GARAMYCIN) 80 mg in sodium chloride 0.9 % 500 mL irrigation  80 mg Irrigation On Call Deboraha Sprang, MD        Allergies  Allergen Reactions  . Triptans Palpitations    Reaction to [place]      Social History   Tobacco Use  . Smoking status: Former Smoker    Packs/day: 0.50    Years: 30.00    Pack years: 15.00    Types: Cigarettes    Last attempt to quit:  08/02/2010    Years since quitting: 7.6  . Smokeless tobacco: Never Used  Substance Use Topics  . Alcohol use: No    Alcohol/week: 0.0 standard drinks  . Drug use: No     Family History  Problem Relation Age of Onset  . Kidney failure Mother   . Lupus Mother   . Stroke Mother   . Diabetes Mother        type 2  . Heart attack Father        X 7  . Hypertension Father   . Hyperlipidemia Father   . Heart attack Sister 67       X 1  . Hyperlipidemia Sister   . Hypertension Sister   . Heart disease Sister   . Heart attack Paternal Grandfather   . Heart disease Paternal Aunt   . Heart disease Paternal Uncle   . Colon cancer Neg Hx   . Esophageal cancer Neg Hx   . Stomach cancer Neg Hx   . Rectal cancer Neg Hx      Current Meds  Medication Sig  . acetaminophen (TYLENOL) 500 MG tablet Take 1,000 mg by mouth every 6 (six) hours as needed for headache (pain).   [name] allopurinol (ZYLOPRIM) 100 MG tablet Take 1 tablet (100 mg total) by mouth daily.  [name] aspirin EC 81 MG tablet Take 81 mg by mouth daily.  [name] atorvastatin (LIPITOR) 80 MG tablet Take 80 mg by mouth at bedtime.  [name] BYSTOLIC 5 MG tablet TAKE 1 TABLET BY MOUTH DAILY (Patient taking differently: Take 5 mg by mouth daily. )  . clopidogrel (PLAVIX) 75 MG tablet TAKE 1 TABLET BY MOUTH DAILY (Patient taking  differently: Take 75 mg by mouth daily. )  . DEXILANT 60 MG capsule TAKE 1 CAPSULE BY MOUTH DAILY BEFORE BREAKFAST (Patient taking differently: Take 60 mg by mouth daily before breakfast. )  . famotidine (PEPCID) 20 MG tablet 1 tab po qhs (Patient taking differently: Take 20 mg by mouth at bedtime. 1 tab po qhs)  . losartan (COZAAR) 25 MG tablet Take 25 mg by mouth at bedtime.   . magnesium oxide (MAG-OX) 400 MG tablet Take 400 mg by mouth daily with lunch.   . mycophenolate (CELLCEPT) 250 MG capsule Take 3 capsules (750 mg total) by mouth 2 (two) times daily. (Patient taking differently: Take 750 mg by mouth every 12 (twelve) hours. )  . niacin (NIASPAN) 500 MG CR tablet TAKE 2 TABLETS BY MOUTH AT BEDTIME (Patient taking differently: Take 1,000 mg by mouth at bedtime. )  . oxymetazoline (AFRIN) 0.05 % nasal spray Place 1 spray into both nostrils at bedtime as needed for congestion.  . predniSONE (DELTASONE) 5 MG tablet TAKE 1 TABLET BY MOUTH DAILY (Patient taking differently: Take 5 mg by mouth daily. )  . tacrolimus (PROGRAF) 0.5 MG capsule Take 0.5 mg by mouth every 12 (twelve) hours.      Review of Systems negative except from HPI and PMH  Physical Exam BP (!) 158/86   Pulse 62   Temp 98 F (36.7 C) (Oral)   Resp 20   Ht 5' 7"  (1.702 m)   Wt 104.3 kg   SpO2 100%   BMI 36.02 kg/m  Well developed and well nourished in no acute distress HENT normal E scleral and icterus clear Neck Supple JVP flat; carotids brisk and full Clear to ausculation Device pocket well healed; without hematoma or erythema.  There is no tethering Regular rate and  rhythm, no murmurs gallops or rub Soft with active bowel sounds No clubbing cyanosis  Edema Alert and oriented, grossly normal motor and sensory function Skin Warm and Dry w multiple striae ascribed to high dose steroids in his youth ( SLE)  ECG  Anterolateral infarct  Old and no significant change from 10/19   Assessment and   Plan  Ischemic cardiomyopathy  Implantable defibrillator Medtronic The patient's device was interrogated.  The information was reviewed. No changes were made in the programming.    Nonsustained ventricular tachycardia  No intercurrent Ventricular tachycardia  Renal transplantation  We have reviewed the benefits and risks of generator replacement.  These include but are not limited to lead fracture and infection.  The patient understands, agrees and is willing to proceed.

## 2018-04-14 MED FILL — Lidocaine HCl Local Preservative Free (PF) Inj 1%: INTRAMUSCULAR | Qty: 60 | Status: AC

## 2018-04-15 LAB — BASIC METABOLIC PANEL
BUN: 16 (ref 4–21)
Creatinine: 1.3 (ref 0.6–1.3)
Glucose: 121
Potassium: 4.5 (ref 3.4–5.3)
Sodium: 140 (ref 137–147)

## 2018-04-15 LAB — CBC AND DIFFERENTIAL
HCT: 53 (ref 41–53)
Hemoglobin: 17.3 (ref 13.5–17.5)
Platelets: 162 (ref 150–399)
WBC: 12.2

## 2018-04-15 LAB — HEMOGLOBIN A1C: HEMOGLOBIN A1C: 5.6

## 2018-04-21 ENCOUNTER — Encounter: Payer: Self-pay | Admitting: Family Medicine

## 2018-04-21 ENCOUNTER — Ambulatory Visit (INDEPENDENT_AMBULATORY_CARE_PROVIDER_SITE_OTHER): Payer: Managed Care, Other (non HMO) | Admitting: *Deleted

## 2018-04-21 DIAGNOSIS — I255 Ischemic cardiomyopathy: Secondary | ICD-10-CM

## 2018-04-21 DIAGNOSIS — Z95810 Presence of automatic (implantable) cardiac defibrillator: Secondary | ICD-10-CM

## 2018-04-21 LAB — CUP PACEART INCLINIC DEVICE CHECK
Battery Remaining Longevity: 137 mo
Battery Voltage: 3.15 V
Brady Statistic RV Percent Paced: 0.39 %
Date Time Interrogation Session: 20191209170632
HIGH POWER IMPEDANCE MEASURED VALUE: 61 Ohm
HighPow Impedance: 48 Ohm
Implantable Lead Implant Date: 20120709
Implantable Lead Location: 753860
Implantable Lead Model: 6947
Implantable Pulse Generator Implant Date: 20191129
Lead Channel Impedance Value: 285 Ohm
Lead Channel Impedance Value: 361 Ohm
Lead Channel Pacing Threshold Amplitude: 1.25 V
Lead Channel Pacing Threshold Pulse Width: 0.5 ms
Lead Channel Sensing Intrinsic Amplitude: 16 mV
Lead Channel Setting Pacing Pulse Width: 0.5 ms
Lead Channel Setting Sensing Sensitivity: 0.3 mV
MDC IDC SET LEADCHNL RV PACING AMPLITUDE: 2.5 V

## 2018-04-21 NOTE — Patient Instructions (Signed)
Wash site at least once daily with soap and water. Pat dry. Apply antibiotic ointment and bandaid to site where stitch was removed for 4-5 days, then leave open to air.  Call the [facility] at [phone] if you notice a stitch or "pimple" at the site, or if you develop signs/symptoms of infection, including drainage, redness, swelling, fever, or chills.

## 2018-04-21 NOTE — Progress Notes (Signed)
Wound check appointment. Dermabond removed. Wound without redness or edema. Stitch removed from medial incision, antibiotic ointment and bandaid applied. Incision edges otherwise fully approximated and healing well. Patient instructed to wash site daily with soap and water, then apply antibiotic ointment and bandaid x4-5 days. Patient agrees to call the DC if stitch is noted at site, or for any signs/symptoms of infection.   Normal device function. Thresholds, sensing, and impedances consistent with implant measurements. Device programmed at chronic outputs, s/p generator replacement. Histogram distribution appropriate for patient and level of activity. No AF episodes or ventricular arrhythmias noted. Patient educated about wound care, arm mobility, shock plan, and Carelink monitor. ROV with SK on 07/14/18.

## 2018-04-29 ENCOUNTER — Telehealth: Payer: Self-pay | Admitting: *Deleted

## 2018-04-29 NOTE — Telephone Encounter (Signed)
Called patient to f/u on wound healing (stitch removed from incision at wound check on 04/21/18). Patient reports site appears fully healed, no scabs, drainage, redness, swelling, fever, or chills noted. He verbalizes understanding of instructions to call our office if he develops any of these symptoms. He is appreciative of call and denies questions or concerns at this time.

## 2018-05-30 LAB — CUP PACEART REMOTE DEVICE CHECK
Battery Voltage: 2.62 V
Brady Statistic RV Percent Paced: 0.32 %
Date Time Interrogation Session: 20191125051604
HighPow Impedance: 304 Ohm
HighPow Impedance: 59 Ohm
HighPow Impedance: 74 Ohm
Implantable Pulse Generator Implant Date: 20120709
Lead Channel Impedance Value: 399 Ohm
Lead Channel Pacing Threshold Amplitude: 1.375 V
Lead Channel Pacing Threshold Pulse Width: 0.4 ms
Lead Channel Sensing Intrinsic Amplitude: 14.875 mV
Lead Channel Setting Pacing Amplitude: 3.25 V
Lead Channel Setting Pacing Pulse Width: 0.4 ms
Lead Channel Setting Sensing Sensitivity: 0.3 mV
MDC IDC MSMT LEADCHNL RV SENSING INTR AMPL: 14.875 mV

## 2018-06-16 ENCOUNTER — Encounter: Payer: Self-pay | Admitting: *Deleted

## 2018-06-17 ENCOUNTER — Encounter: Payer: Managed Care, Other (non HMO) | Admitting: Family Medicine

## 2018-06-25 ENCOUNTER — Encounter: Payer: Self-pay | Admitting: Family Medicine

## 2018-06-25 ENCOUNTER — Ambulatory Visit: Payer: Self-pay | Admitting: Family Medicine

## 2018-06-25 VITALS — BP 118/68 | HR 68 | Temp 98.1°F | Resp 16 | Ht 67.0 in | Wt 222.2 lb

## 2018-06-25 DIAGNOSIS — R05 Cough: Secondary | ICD-10-CM

## 2018-06-25 DIAGNOSIS — J0100 Acute maxillary sinusitis, unspecified: Secondary | ICD-10-CM

## 2018-06-25 DIAGNOSIS — R059 Cough, unspecified: Secondary | ICD-10-CM

## 2018-06-25 LAB — POC INFLUENZA A&B (BINAX/QUICKVUE)
Influenza A, POC: NEGATIVE
Influenza B, POC: NEGATIVE

## 2018-06-25 MED ORDER — BENZONATATE 200 MG PO CAPS: 200.0000 mg | ORAL_CAPSULE | Freq: Two times a day (BID) | ORAL | 0 refills | Status: DC | PRN

## 2018-06-25 MED ORDER — AMOXICILLIN-POT CLAVULANATE 875-125 MG PO TABS: 1.0000 | ORAL_TABLET | Freq: Two times a day (BID) | ORAL | 0 refills | Status: DC

## 2018-06-25 NOTE — Progress Notes (Signed)
Roy Dyer , [birth year], 53 y.o., male MRN: [id number] Patient Care Team    Relationship Specialty Notifications Start End  McGowen, [name], MD PCP - General Family Medicine  10/23/10   Josue Hector, MD Consulting Physician Cardiology  11/22/10   Deboraha Sprang, MD Consulting Physician Cardiology  11/22/10    Comment: Latrelle Dodrill, MD Consulting Physician Nephrology  11/22/10   Early, Arvilla Meres, MD Consulting Physician Vascular Surgery  03/21/11    Comment: AV fistula planned for 03/29/11  Lowella Bandy, MD Consulting Physician Urology  11/26/11    Comment: for ED  Pyrtle, Lajuan Lines, MD Consulting Physician Gastroenterology  12/13/15   Pieter Partridge, DO Consulting Physician Neurology  07/09/16   Deneise Lever, MD Consulting Physician Pulmonary Disease  01/30/17   Frederik Pear, MD Consulting Physician Orthopedic Surgery  07/01/17   Darien Ramus, MD Consulting Physician Nephrology  04/29/18    Comment: [facility] transplant clinic    Chief Complaint  Patient presents with  . Cough    Productive cough x1 week.   . Sinusitis    Pt feels pressure in head      Subjective: Pt presents for an OV with complaints of cough of > 1 week duration.  Associated symptoms include cough is productive. Sinus pressure and headache present. He denies fever, chills, nausea, vomit. He feels it is like bronchitis and sinus issues. He feels some wheezing.  Flu shot UTD this season. He uses his CPAP.  Pt has tried sudafed, mucinex DM to ease their symptoms.  He is had a renal transplant a few years ago  and prescribed Cellcept and tacrolimus.   Depression screen [facility] 2/9 03/03/2018 02/27/2017  Decreased Interest 0 0  Down, Depressed, Hopeless 0 0  PHQ - 2 Score 0 0  Some recent data might be hidden    Allergies  Allergen Reactions  . Triptans Palpitations    Reaction to [place] Narrative   Married, 1 teenage son and 1 teenage daughter.    Occupation: Printmaker.   15 pack-yr smoking hx, quit 07/2010.   Drug Use - no   No alcohol.            Past Medical History:  Diagnosis Date  . AICD (automatic cardioverter/defibrillator) present    Dr. Paschal Dopp follows remotely-yrly checks, Dr. Jonne Ply  . Avascular necrosis of bone of hip ([place]) 2010 surg   Left hip arthroplasty: from chronic systemic steroids taken for his Lupus  . Barrett's esophagus   . Blood transfusion without reported diagnosis 2010, 2012  . CAD (coronary artery disease)    a. stents RCA/Circ 2001 b. BMS to LAD 07/2010  c. 01/2017: s/p DES to RCA.   . Cataract    left eye  . CHF (congestive heart failure) ([place])   . Chronic headaches    Dr. [name] 06/2016: suspects tension HA's.  CT brain was considered (MRI was not approved by insurer) but ended up not being needed as of 09/2016.  His neurontin was increased to 600 mg bid but this caused hyperkalemia so he d/c'd this med.  HA's improved with decre caffeine and incre water.   Also got referred for eval for possible OSA.  Renal MD suspicous that his polycyt may be cause HAs..  . Chronic headaches 02/2017   As of 01/2017, HA's no better, neurologist ordered CT.  ESR normal at that time.  [name]  Chronic renal insufficiency, stage II (mild)    GFR 65 ml/min 07/19/15 at local renal f/u (Cr 1.29).  GFR 64 ml/min (cr 1.3) at local renal f/u 01/16/17.  [name] GERD (gastroesophageal reflux disease)    Hx of esoph stricture and dilatation.  +Barrett's esophagus+hx of aspiration pneumonia-->to get rpt EGD when he comes off DAPT (utd as of 01/29/18)  . Gout    s/p renal transplant he was weened off of his allopurinol.  . Hiatal hernia   . 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 History of renal transplant 11/10/2012   10/2012 [facility]   . Hyperlipidemia   . Hypertension   . IFG (impaired fasting glucose)   . implantable cardiac defibrillator single chamber     Medtronic (due to low EF)  . Ischemic cardiomyopathy    Chronic systolic dysfunction--  54/6503 EF 30-35%.   Single chamber ICD 11/20/10 ([name])  . Left ventricular thrombus 2012   Re-eval 02/2011 showed thrombus RESOLVED, so coumadin was d/c'd (was on it for 44mo  . [place]   . OSA on CPAP 2018   Dr. [name]9/2018: +OSA on home sleep studay; CPAP auto titrate 5-20 started 05/2017.  . Osteoporosis   . Post-transplant erythrocytosis    improved with ARB (valsartan)   Past Surgical History:  Procedure Laterality Date  . AV FISTULA PLACEMENT  03/28/2011   Procedure: ARTERIOVENOUS (AV) FISTULA CREATION;  Surgeon: TRosetta Posner MD;  [facility]  Service: Vascular;  Laterality: Left;  Creation of Left radiocephallic cimino fistula  . AV FISTULA PLACEMENT  04/27/2011   Procedure: ARTERIOVENOUS (AV) FISTULA CREATION;  Surgeon: TRosetta Posner MD;  [place] [facility]  Service: Vascular;  Laterality: Left;  left basilic vein transposition  . CARDIAC CATHETERIZATION     08/2010  . CARDIOVASCULAR STRESS TEST  07/2010; 03/2014   03/2014 showed large old infarct and EF 30-35% but no ischemia  . DEXA  07/05/2017   Normal bone density in radius and spine.  . ESOPHAGOGASTRODUODENOSCOPY  02/2009   Done by Dr. SFuller Planfor hematemesis; esophagitis found.  Repeat recommended 09/2016, at which time he will also likely get his initial screening colonoscopy.  . ESOPHAGOGASTRODUODENOSCOPY (EGD) WITH PROPOFOL N/A 09/29/2015   REPEAT EGD RECOMMENDED 09/2016.  Barrett's esoph + mild chronic gastritis.  H pylori NEG. Procedure: ESOPHAGOGASTRODUODENOSCOPY (EGD) WITH PROPOFOL;  Surgeon: JJerene Bears MD;  [facility];  Service: Gastroenterology;  Laterality: N/A;  . ICD GENERATOR CHANGEOUT N/A 04/11/2018   Procedure: ICD GENERATOR CHANGEOUT;  Surgeon: KDeboraha Sprang MD;  [place] [facility];  Service: Cardiovascular;  Laterality: N/A;  . INSERT / REPLACE / REMOVE PACEMAKER     medtronic         dr nFrances Nickels   [place]   icd only  . KIDNEY TRANSPLANT  10/2012   [facility] nephrologist--Dr. MBlair Heys . LEFT HEART CATH AND CORONARY ANGIOGRAPHY N/A 02/08/2017   PCA and DES to mRCA--needs DAPT for at least 6 mo.   Procedure: LEFT HEART CATH AND CORONARY ANGIOGRAPHY;  Surgeon: CSherren Mocha MD;  [facility];  Service: Cardiovascular;  Laterality: N/A;  . PARTIAL HIP ARTHROPLASTY Left 2010   left  . RENAL BIOPSY    . stents     05-2010 and 2- in 2010 and 1 in 2018.  .Marland KitchenTOTAL HIP ARTHROPLASTY  07/09/2011  Procedure: TOTAL HIP ARTHROPLASTY;  Surgeon: Kerin Salen, MD;  Location: [place];  Service: Orthopedics;  Laterality: Right;  . TYMPANOSTOMY TUBE PLACEMENT  53 yrs old  . US ECHOCARDIOGRAPHY  12/2010; 02/2014;08/2014   02/2014 EF still 25-30%, increased PA pressures.  2016 EF 30-35%, sept/inf hypokinesis, mod tricusp regurg   Family History  Problem Relation Age of Onset  . Kidney failure Mother   . Lupus Mother   . Stroke Mother   . Diabetes Mother        type 2  . Heart attack Father        X 7  . Hypertension Father   . Hyperlipidemia Father   . Heart attack Sister 46       X 1  . Hyperlipidemia Sister   . Hypertension Sister   . Heart disease Sister   . Heart attack Paternal Grandfather   . Heart disease Paternal Aunt   . Heart disease Paternal Uncle   . Colon cancer Neg Hx   . Esophageal cancer Neg Hx   . Stomach cancer Neg Hx   . Rectal cancer Neg Hx    Allergies as of 06/25/2018      Reactions   Triptans Palpitations   Reaction to [place]      Medication List       Accurate as of June 25, 2018  8:32 AM. Always use your most recent med list.        acetaminophen 500 MG tablet Commonly known as:  TYLENOL Take 1,000 mg by mouth every 6 (six) hours as needed for headache (pain).   allopurinol 100 MG tablet Commonly known as:  ZYLOPRIM Take 1 tablet (100 mg total) by mouth daily.   amoxicillin-clavulanate 875-125 MG tablet Commonly  known as:  AUGMENTIN Take 1 tablet by mouth 2 (two) times daily.   aspirin EC 81 MG tablet Take 81 mg by mouth daily.   atorvastatin 80 MG tablet Commonly known as:  LIPITOR Take 80 mg by mouth at bedtime.   benzonatate 200 MG capsule Commonly known as:  TESSALON Take 1 capsule (200 mg total) by mouth 2 (two) times daily as needed.   BYSTOLIC 5 MG tablet Generic drug:  nebivolol TAKE 1 TABLET BY MOUTH DAILY   clopidogrel 75 MG tablet Commonly known as:  PLAVIX TAKE 1 TABLET BY MOUTH DAILY   DEXILANT 60 MG capsule Generic drug:  dexlansoprazole TAKE 1 CAPSULE BY MOUTH DAILY BEFORE BREAKFAST   famotidine 20 MG tablet Commonly known as:  PEPCID 1 tab po qhs   losartan 25 MG tablet Commonly known as:  COZAAR Take 25 mg by mouth at bedtime.   magnesium oxide 400 MG tablet Commonly known as:  MAG-OX Take 400 mg by mouth daily with lunch.   mycophenolate 250 MG capsule Commonly known as:  CELLCEPT Take 3 capsules (750 mg total) by mouth 2 (two) times daily.   niacin 500 MG CR tablet Commonly known as:  NIASPAN TAKE 2 TABLETS BY MOUTH AT BEDTIME   nitroGLYCERIN 0.4 MG SL tablet Commonly known as:  NITROSTAT Place 0.4 mg under the tongue every 5 (five) minutes as needed for chest pain (DO NOT EXCEED 3 DOSES).   predniSONE 5 MG tablet Commonly known as:  DELTASONE TAKE 1 TABLET BY MOUTH DAILY   tacrolimus 0.5 MG capsule Commonly known as:  PROGRAF Take 0.5 mg by mouth every 12 (twelve) hours.   traMADol 50 MG tablet Commonly known as:  ULTRAM Take  50 mg by mouth every 6 (six) hours as needed. for pain   zolpidem 5 MG tablet Commonly known as:  AMBIEN Take 5 mg by mouth at bedtime as needed for sleep.       All past medical history, surgical history, allergies, family history, immunizations andmedications were updated in the EMR today and reviewed under the history and medication portions of their EMR.     ROS: Negative, with the exception of above  mentioned in HPI   Objective:  BP 118/68 (BP Location: Right Arm, Patient Position: Sitting, Cuff Size: Normal)   Pulse 68   Temp 98.1 F (36.7 C) (Oral)   Resp 16   Ht 5' 7"  (1.702 m)   Wt 222 lb 4 oz (100.8 kg)   SpO2 99%   BMI 34.81 kg/m  Body mass index is 34.81 kg/m. Gen: Afebrile. No acute distress. Nontoxic in appearance, well developed, well nourished.  HENT: AT. [place]. Bilateral TM visualized fullness bilateral. MMM, no oral lesions. Bilateral nares w/ erythema, swelling and drainage. Throat without erythema or exudates. Cough present, mild hoarseness present.  Eyes:Pupils Equal Round Reactive to light, Extraocular movements intact,  Conjunctiva without redness, discharge or icterus. Neck/lymp/endocrine: Supple,no lymphadenopathy CV: RRR  Chest: CTAB, no wheeze or crackles. Good air movement, normal resp effort.  Skin: no rashes, purpura or petechiae.  Neuro:  Normal gait. PERLA. EOMi. Alert. Oriented x3   No exam data present No results found. Results for orders placed or performed in visit on 06/25/18 (from the past 24 hour(s))  POC Influenza A&B(BINAX/QUICKVUE)     Status: Normal   Collection Time: 06/25/18  8:26 AM  Result Value Ref Range   Influenza A, POC Negative Negative   Influenza B, POC Negative Negative    Assessment/Plan: Roy Dyer is a 53 y.o. male present for OV for  Cough/Acute non-recurrent maxillary sinusitis Rest, hydrate.  Reviewed last labs surrounding kidney function given transplant. He has tolerated Augmentin in the past per his reports. + flonase, mucinex (DM if cough), nettie pot or nasal saline.  Augmentin and tessalon prescribed, take until completed.  If cough present it can last up to 6-8 weeks.  F/U 2 weeks of not improved.    Reviewed expectations re: course of current medical issues.  Discussed self-management of symptoms.  Outlined signs and symptoms indicating need for more acute intervention.  Patient verbalized  understanding and all questions were answered.  Patient received an After-Visit Summary.    Orders Placed This Encounter  Procedures  . POC Influenza A&B(BINAX/QUICKVUE)    > 25 minutes spent with patient, >50% of time spent face to face    Note is dictated utilizing voice recognition software. Although note has been proof read prior to signing, occasional typographical errors still can be missed. If any questions arise, please do not hesitate to call for verification.   electronically signed by:  Howard Pouch, DO  [place]

## 2018-06-25 NOTE — Patient Instructions (Signed)
Rest, hydrate.  + flonase, mucinex (DM if cough), nettie pot or nasal saline.  Augmentin and tessalon prescribed, take until completed.  If cough present it can last up to 6-8 weeks.  F/U 2 weeks of not improved.     Sinusitis, Adult Sinusitis is soreness and swelling (inflammation) of your sinuses. Sinuses are hollow spaces in the bones around your face. They are located:  Around your eyes.  In the middle of your forehead.  Behind your nose.  In your cheekbones. Your sinuses and nasal passages are lined with a fluid called mucus. Mucus drains out of your sinuses. Swelling can trap mucus in your sinuses. This lets germs (bacteria, virus, or fungus) grow, which leads to infection. Most of the time, this condition is caused by a virus. What are the causes? This condition is caused by:  Allergies.  Asthma.  Germs.  Things that block your nose or sinuses.  Growths in the nose (nasal polyps).  Chemicals or irritants in the air.  Fungus (rare). What increases the risk? You are more likely to develop this condition if:  You have a weak body defense system (immune system).  You do a lot of swimming or diving.  You use nasal sprays too much.  You smoke. What are the signs or symptoms? The main symptoms of this condition are pain and a feeling of pressure around the sinuses. Other symptoms include:  Stuffy nose (congestion).  Runny nose (drainage).  Swelling and warmth in the sinuses.  Headache.  Toothache.  A cough that may get worse at night.  Mucus that collects in the throat or the back of the nose (postnasal drip).  Being unable to smell and taste.  Being very tired (fatigue).  A fever.  Sore throat.  Bad breath. How is this diagnosed? This condition is diagnosed based on:  Your symptoms.  Your medical history.  A physical exam.  Tests to find out if your condition is short-term (acute) or long-term (chronic). Your doctor may: ? Check your  nose for growths (polyps). ? Check your sinuses using a tool that has a light (endoscope). ? Check for allergies or germs. ? Do imaging tests, such as an MRI or CT scan. How is this treated? Treatment for this condition depends on the cause and whether it is short-term or long-term.  If caused by a virus, your symptoms should go away on their own within 10 days. You may be given medicines to relieve symptoms. They include: ? Medicines that shrink swollen tissue in the nose. ? Medicines that treat allergies (antihistamines). ? A spray that treats swelling of the nostrils. ? Rinses that help get rid of thick mucus in your nose (nasal saline washes).  If caused by bacteria, your doctor may wait to see if you will get better without treatment. You may be given antibiotic medicine if you have: ? A very bad infection. ? A weak body defense system.  If caused by growths in the nose, you may need to have surgery. Follow these instructions at home: Medicines  Take, use, or apply over-the-counter and prescription medicines only as told by your doctor. These may include nasal sprays.  If you were prescribed an antibiotic medicine, take it as told by your doctor. Do not stop taking the antibiotic even if you start to feel better. Hydrate and humidify   Drink enough water to keep your pee (urine) pale yellow.  Use a cool mist humidifier to keep the humidity level in your  home above 50%.  Breathe in steam for 10-15 minutes, 3-4 times a day, or as told by your doctor. You can do this in the bathroom while a hot shower is running.  Try not to spend time in cool or dry air. Rest  Rest as much as you can.  Sleep with your head raised (elevated).  Make sure you get enough sleep each night. General instructions   Put a warm, moist washcloth on your face 3-4 times a day, or as often as told by your doctor. This will help with discomfort.  Wash your hands often with soap and water. If there  is no soap and water, use hand sanitizer.  Do not smoke. Avoid being around people who are smoking (secondhand smoke).  Keep all follow-up visits as told by your doctor. This is important. Contact a doctor if:  You have a fever.  Your symptoms get worse.  Your symptoms do not get better within 10 days. Get help right away if:  You have a very bad headache.  You cannot stop throwing up (vomiting).  You have very bad pain or swelling around your face or eyes.  You have trouble seeing.  You feel confused.  Your neck is stiff.  You have trouble breathing. Summary  Sinusitis is swelling of your sinuses. Sinuses are hollow spaces in the bones around your face.  This condition is caused by tissues in your nose that become inflamed or swollen. This traps germs. These can lead to infection.  If you were prescribed an antibiotic medicine, take it as told by your doctor. Do not stop taking it even if you start to feel better.  Keep all follow-up visits as told by your doctor. This is important. This information is not intended to replace advice given to you by your health care provider. Make sure you discuss any questions you have with your health care provider. Document Released: 10/17/2007 Document Revised: 09/30/2017 Document Reviewed: 09/30/2017 Elsevier Interactive Patient Education  2019 [employer].

## 2018-06-26 MED ORDER — OSELTAMIVIR PHOSPHATE 75 MG PO CAPS: 75.0000 mg | ORAL_CAPSULE | Freq: Two times a day (BID) | ORAL | 0 refills | Status: DC

## 2018-06-26 NOTE — Telephone Encounter (Signed)
Please advise. Thanks.  

## 2018-06-26 NOTE — Telephone Encounter (Signed)
I eRx'd tamiflu to [facility].

## 2018-07-14 ENCOUNTER — Ambulatory Visit: Payer: Managed Care, Other (non HMO) | Admitting: Internal Medicine

## 2018-07-14 ENCOUNTER — Encounter: Payer: Self-pay | Admitting: Internal Medicine

## 2018-07-14 VITALS — BP 140/78 | HR 56 | Ht 67.0 in | Wt 219.6 lb

## 2018-07-14 DIAGNOSIS — I255 Ischemic cardiomyopathy: Secondary | ICD-10-CM | POA: Diagnosis not present

## 2018-07-14 DIAGNOSIS — Z940 Kidney transplant status: Secondary | ICD-10-CM

## 2018-07-14 DIAGNOSIS — Z95810 Presence of automatic (implantable) cardiac defibrillator: Secondary | ICD-10-CM

## 2018-07-14 NOTE — Patient Instructions (Signed)

## 2018-07-14 NOTE — Progress Notes (Signed)
Patient Care Team: [name], MD as PCP - General (Family Medicine) Josue Hector, MD as Consulting Physician (Cardiology) Deboraha Sprang, MD as Consulting Physician (Cardiology) Corliss Parish, MD as Consulting Physician (Nephrology) Early, Arvilla Meres, MD as Consulting Physician (Vascular Surgery) Lowella Bandy, MD as Consulting Physician (Urology) Pyrtle, Lajuan Lines, MD as Consulting Physician (Gastroenterology) Pieter Partridge, DO as Consulting Physician (Neurology) Deneise Lever, MD as Consulting Physician (Pulmonary Disease) Frederik Pear, MD as Consulting Physician (Orthopedic Surgery) Darien Ramus, MD as Consulting Physician (Nephrology)   HPI  Roy Dyer is a 53 y.o. male Seen in followup for ICD implanted for primary prevention. He has primarily an ischemic myopathy status post LAD occlusion and bare-metal stenti with stentng of his LAD with prior stenting of the circumflex and RCA.    DATE TEST EF   7/12 cMRI  20 %   3/13 Echo   35 %   9/18 LHC  LAD 20; OM 40 mRCA 90>>0   DES     His renal transplant is doing beautifully. Creatinine is 1.2 The patient denies chest pain, shortness of breath, nocturnal dyspnea, orthopnea or peripheral edema.  There have been no palpitations, lightheadedness or syncope.  Dr Johnsie Cancel keeps him on niaspan        Past Medical History:  Diagnosis Date  . AICD (automatic cardioverter/defibrillator) present    Dr. Paschal Dopp follows remotely-yrly checks, Dr. Jonne Ply  . Avascular necrosis of bone of hip ([place]) 2010 surg   Left hip arthroplasty: from chronic systemic steroids taken for his Lupus  . Barrett's esophagus   . Blood transfusion without reported diagnosis 2010, 2012  . CAD (coronary artery disease)    a. stents RCA/Circ 2001 b. BMS to LAD 07/2010  c. 01/2017: s/p DES to RCA.   . Cataract    left eye  . CHF (congestive heart failure) ([place])   . Chronic headaches    Dr. [name] 06/2016: suspects tension  HA's.  CT brain was considered (MRI was not approved by insurer) but ended up not being needed as of 09/2016.  His neurontin was increased to 600 mg bid but this caused hyperkalemia so he d/c'd this med.  HA's improved with decre caffeine and incre water.   Also got referred for eval for possible OSA.  Renal MD suspicous that his polycyt may be cause HAs..  . Chronic headaches 02/2017   As of 01/2017, HA's no better, neurologist ordered CT.  ESR normal at that time.  . Chronic renal insufficiency, stage II (mild)    GFR 65 ml/min 07/19/15 at local renal f/u (Cr 1.29).  GFR 64 ml/min (cr 1.3) at local renal f/u 01/16/17.  [name] GERD (gastroesophageal reflux disease)    Hx of esoph stricture and dilatation.  +Barrett's esophagus+hx of aspiration pneumonia-->to get rpt EGD when he comes off DAPT (utd as of 01/29/18)  . Gout    s/p renal transplant he was weened off of his allopurinol.  . Hiatal hernia   . 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 History of renal transplant 11/10/2012   10/2012 [facility]   . Hyperlipidemia   . Hypertension   . IFG (impaired fasting glucose)   . implantable cardiac defibrillator single chamber    Medtronic (due to low EF)  . Ischemic cardiomyopathy    Chronic systolic dysfunction--  29/4765 EF 30-35%.   Single  chamber ICD 11/20/10 ([name])  . Left ventricular thrombus 2012   Re-eval 02/2011 showed thrombus RESOLVED, so coumadin was d/c'd (was on it for 12mo  . [place]   . OSA on CPAP 2018   Dr. [name]9/2018: +OSA on home sleep studay; CPAP auto titrate 5-20 started 05/2017.  . Osteoporosis   . Post-transplant erythrocytosis    improved with ARB (valsartan)    Past Surgical History:  Procedure Laterality Date  . AV FISTULA PLACEMENT  03/28/2011   Procedure: ARTERIOVENOUS (AV) FISTULA CREATION;  Surgeon: TRosetta Posner MD;  [place]  Service: Vascular;  Laterality: Left;  Creation of Left  radiocephallic cimino fistula  . AV FISTULA PLACEMENT  04/27/2011   Procedure: ARTERIOVENOUS (AV) FISTULA CREATION;  Surgeon: TRosetta Posner MD;  [place]  Service: Vascular;  Laterality: Left;  left basilic vein transposition  . CARDIAC CATHETERIZATION     08/2010  . CARDIOVASCULAR STRESS TEST  07/2010; 03/2014   03/2014 showed large old infarct and EF 30-35% but no ischemia  . DEXA  07/05/2017   Normal bone density in radius and spine.  . ESOPHAGOGASTRODUODENOSCOPY  02/2009   Done by Dr. SFuller Planfor hematemesis; esophagitis found.  Repeat recommended 09/2016, at which time he will also likely get his initial screening colonoscopy.  . ESOPHAGOGASTRODUODENOSCOPY (EGD) WITH PROPOFOL N/A 09/29/2015   REPEAT EGD RECOMMENDED 09/2016.  Barrett's esoph + mild chronic gastritis.  H pylori NEG. Procedure: ESOPHAGOGASTRODUODENOSCOPY (EGD) WITH PROPOFOL;  Surgeon: JJerene Bears MD;  [facility];  Service: Gastroenterology;  Laterality: N/A;  . ICD GENERATOR CHANGEOUT N/A 04/11/2018   Procedure: ICD GENERATOR CHANGEOUT;  Surgeon: KDeboraha Sprang MD;  [facility];  Service: Cardiovascular;  Laterality: N/A;  . INSERT / REPLACE / REMOVE PACEMAKER     medtronic        dr nFrances Nickels   [place]   icd only  . KIDNEY TRANSPLANT  10/2012   [facility] nephrologist--Dr. MBlair Heys . LEFT HEART CATH AND CORONARY ANGIOGRAPHY N/A 02/08/2017   PCA and DES to mRCA--needs DAPT for at least 6 mo.   Procedure: LEFT HEART CATH AND CORONARY ANGIOGRAPHY;  Surgeon: CSherren Mocha MD;  [facility];  Service: Cardiovascular;  Laterality: N/A;  . PARTIAL HIP ARTHROPLASTY Left 2010   left  . RENAL BIOPSY    . stents     05-2010 and 2- in 2010 and 1 in 2018.  .Marland KitchenTOTAL HIP ARTHROPLASTY  07/09/2011   Procedure: TOTAL HIP ARTHROPLASTY;  Surgeon: FKerin Salen MD;  [place]  Service: Orthopedics;  Laterality: Right;  . TYMPANOSTOMY TUBE PLACEMENT  53yrs old  . UKoreaECHOCARDIOGRAPHY   12/2010; 02/2014;08/2014   02/2014 EF still 25-30%, increased PA pressures.  2016 EF 30-35%, sept/inf hypokinesis, mod tricusp regurg    Current Outpatient Medications  Medication Sig Dispense Refill  . acetaminophen (TYLENOL) 500 MG tablet Take 1,000 mg by mouth every 6 (six) hours as needed for headache (pain).     .Marland Kitchenallopurinol (ZYLOPRIM) 100 MG tablet Take 1 tablet (100 mg total) by mouth daily. 90 tablet 3  . aspirin EC 81 MG tablet Take 81 mg by mouth daily.    .Marland Kitchenatorvastatin (LIPITOR) 80 MG tablet Take 80 mg by mouth at bedtime.    .Marland KitchenBYSTOLIC 5 MG tablet TAKE 1 TABLET BY MOUTH DAILY 90 tablet 3  . clopidogrel (PLAVIX) 75 MG tablet TAKE  1 TABLET BY MOUTH DAILY 90 tablet 3  . DEXILANT 60 MG capsule TAKE 1 CAPSULE BY MOUTH DAILY BEFORE BREAKFAST 90 capsule 0  . famotidine (PEPCID) 20 MG tablet 1 tab po qhs 30 tablet 6  . losartan (COZAAR) 25 MG tablet Take 25 mg by mouth at bedtime.     . magnesium oxide (MAG-OX) 400 MG tablet Take 400 mg by mouth daily with lunch.     . mycophenolate (CELLCEPT) 250 MG capsule Take 3 capsules (750 mg total) by mouth 2 (two) times daily. 540 capsule 1  . niacin (NIASPAN) 500 MG CR tablet TAKE 2 TABLETS BY MOUTH AT BEDTIME 180 tablet 3  . nitroGLYCERIN (NITROSTAT) 0.4 MG SL tablet Place 0.4 mg under the tongue every 5 (five) minutes as needed for chest pain (DO NOT EXCEED 3 DOSES).    . predniSONE (DELTASONE) 5 MG tablet TAKE 1 TABLET BY MOUTH DAILY 90 tablet 3  . tacrolimus (PROGRAF) 0.5 MG capsule Take 0.5 mg by mouth every 12 (twelve) hours.     . traMADol (ULTRAM) 50 MG tablet Take 50 mg by mouth every 6 (six) hours as needed. for pain  0  . zolpidem (AMBIEN) 5 MG tablet Take 5 mg by mouth at bedtime as needed for sleep.     No current facility-administered medications for this visit.     Allergies  Allergen Reactions  . Triptans Palpitations    Reaction to Maxalt    Review of Systems negative except from HPI and PMH  Physical Exam BP 140/78    Pulse (!) 56   Ht _0  (1.702 m)   Wt 219 lb 9.6 oz (99.6 kg)   SpO2 96%   BMI 34.39 kg/m  Well developed and well nourished in no acute distress HENT normal Neck supple with JVP-flat Clear Device pocket well healed; without hematoma or erythema.  There is no tethering  Regular rate and rhythm, no  gallop No / murmur Abd-soft with active BS No Clubbing cyanosis  edema Skin-warm and dry A & Oriented  Grossly normal sensory and motor function    ECG  Sinus 56 21/11/43   Assessment and  Plan  Ischemic cardiomyopathy  Implantable defibrillator Medtronic The patient's device was interrogated.  The information was reviewed. No changes were made in the programming.      Nonsustained ventricular tachycardia     Renal transplantation

## 2018-07-15 ENCOUNTER — Encounter: Payer: Self-pay | Admitting: Family Medicine

## 2018-07-15 ENCOUNTER — Ambulatory Visit: Payer: Self-pay | Admitting: Family Medicine

## 2018-07-15 VITALS — BP 130/72 | HR 58 | Temp 97.7°F | Resp 16 | Ht 67.0 in | Wt 217.6 lb

## 2018-07-15 DIAGNOSIS — Z0000 Encounter for general adult medical examination without abnormal findings: Secondary | ICD-10-CM

## 2018-07-15 DIAGNOSIS — Z1211 Encounter for screening for malignant neoplasm of colon: Secondary | ICD-10-CM

## 2018-07-15 DIAGNOSIS — R7301 Impaired fasting glucose: Secondary | ICD-10-CM | POA: Diagnosis not present

## 2018-07-15 DIAGNOSIS — E669 Obesity, unspecified: Secondary | ICD-10-CM

## 2018-07-15 DIAGNOSIS — Z125 Encounter for screening for malignant neoplasm of prostate: Secondary | ICD-10-CM | POA: Diagnosis not present

## 2018-07-15 LAB — LIPID PANEL
CHOL/HDL RATIO: 2
Cholesterol: 122 mg/dL (ref 0–200)
HDL: 61.8 mg/dL (ref >39.00)
LDL CALC: 40 mg/dL (ref 0–99)
NonHDL: 60.46
Triglycerides: 103 mg/dL (ref 0.0–149.0)
VLDL: 20.6 mg/dL (ref 0.0–40.0)

## 2018-07-15 LAB — PSA: PSA: 1.7 ng/mL (ref 0.10–4.00)

## 2018-07-15 NOTE — Progress Notes (Signed)
Office Note 07/15/2018  CC:  Chief Complaint  Patient presents with  . Annual Exam    Pt is fasting    HPI:  Roy Dyer is a 53 y.o. White male who is here for annual health maintenance exam.  Doing well.  Has been making some TLC changes and has lost 15 lbs since I last saw him. At his most recent [facility] f/u he got labs, says he got A1c, met panel, TSH, CBC at that time. I'll get these records rather than repeat these labs today.  Eye exam UTD-about 6 mo ago. Dental: preventative care last done yesterday.  Had a URI/bronchitis relatively recently, significantly improved/almost resolved completely s/p abx but then he aspirated on reflux 2-3 nights ago and says cough has returned to mild/moderate degree.  He says this is not uncommon for him and it usually takes a couple days before his coughing subsides.  No fever, malaise, wheezing, or SOB or CP.   Past Medical History:  Diagnosis Date  . AICD (automatic cardioverter/defibrillator) present    Dr. Paschal Dopp follows remotely-yrly checks, Dr. Jonne Ply  . Avascular necrosis of bone of hip ([place]) 2010 surg   Left hip arthroplasty: from chronic systemic steroids taken for his Lupus  . Barrett's esophagus   . Blood transfusion without reported diagnosis 2010, 2012  . CAD (coronary artery disease)    a. stents RCA/Circ 2001 b. BMS to LAD 07/2010  c. 01/2017: s/p DES to RCA.   . Cataract    left eye  . CHF (congestive heart failure) ([place])   . Chronic headaches    Dr. [name] 06/2016: suspects tension HA's.  CT brain was considered (MRI was not approved by insurer) but ended up not being needed as of 09/2016.  His neurontin was increased to 600 mg bid but this caused hyperkalemia so he d/c'd this med.  HA's improved with decre caffeine and incre water.   Also got referred for eval for possible OSA.  Renal MD suspicous that his polycyt may be cause HAs..  . Chronic headaches 02/2017   As of 01/2017, HA's no better,  neurologist ordered CT.  ESR normal at that time.  . Chronic renal insufficiency, stage II (mild)    GFR 65 ml/min 07/19/15 at local renal f/u (Cr 1.29).  GFR 64 ml/min (cr 1.3) at local renal f/u 01/16/17.  [name] GERD (gastroesophageal reflux disease)    Hx of esoph stricture and dilatation.  +Barrett's esophagus+hx of aspiration pneumonia-->to get rpt EGD when he comes off DAPT (utd as of 01/29/18)  . Gout    s/p renal transplant he was weened off of his allopurinol.  . Hiatal hernia   . 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 History of renal transplant 11/10/2012   10/2012 [facility]   . Hyperlipidemia   . Hypertension   . IFG (impaired fasting glucose)   . implantable cardiac defibrillator single chamber    Medtronic (due to low EF)  . Ischemic cardiomyopathy    Chronic systolic dysfunction--  50/5397 EF 30-35%.   Single chamber ICD 11/20/10 ([name])  . Left ventricular thrombus 2012   Re-eval 02/2011 showed thrombus RESOLVED, so coumadin was d/c'd (was on it for 54mo  . Lupus ([place]   . OSA on CPAP 2018   Dr. [name]9/2018: +OSA on home sleep studay; CPAP auto titrate 5-20 started 05/2017.  . Osteoporosis   .  Post-transplant erythrocytosis    improved with ARB (valsartan)    Past Surgical History:  Procedure Laterality Date  . AV FISTULA PLACEMENT  03/28/2011   Procedure: ARTERIOVENOUS (AV) FISTULA CREATION;  Surgeon: Rosetta Posner, MD;  Location: [place];  Service: Vascular;  Laterality: Left;  Creation of Left radiocephallic cimino fistula  . AV FISTULA PLACEMENT  04/27/2011   Procedure: ARTERIOVENOUS (AV) FISTULA CREATION;  Surgeon: Rosetta Posner, MD;  Location: [place];  Service: Vascular;  Laterality: Left;  left basilic vein transposition  . CARDIAC CATHETERIZATION     08/2010  . CARDIOVASCULAR STRESS TEST  07/2010; 03/2014   03/2014 showed large old infarct and EF 30-35% but no ischemia  . DEXA  07/05/2017   Normal  bone density in radius and spine.  . ESOPHAGOGASTRODUODENOSCOPY  02/2009   Done by Dr. Fuller Plan for hematemesis; esophagitis found.  Repeat recommended 09/2016, at which time he will also likely get his initial screening colonoscopy.  . ESOPHAGOGASTRODUODENOSCOPY (EGD) WITH PROPOFOL N/A 09/29/2015   REPEAT EGD RECOMMENDED 09/2016.  Barrett's esoph + mild chronic gastritis.  H pylori NEG. Procedure: ESOPHAGOGASTRODUODENOSCOPY (EGD) WITH PROPOFOL;  Surgeon: Jerene Bears, MD;  [facility];  Service: Gastroenterology;  Laterality: N/A;  . ICD GENERATOR CHANGEOUT N/A 04/11/2018   Procedure: ICD GENERATOR CHANGEOUT;  Surgeon: Deboraha Sprang, MD;  Location: [place] [facility];  Service: Cardiovascular;  Laterality: N/A;  . INSERT / REPLACE / REMOVE PACEMAKER     medtronic        dr [name]    [place]   icd only  . KIDNEY TRANSPLANT  10/2012   [facility] nephrologist--Dr. Blair Heys  . LEFT HEART CATH AND CORONARY ANGIOGRAPHY N/A 02/08/2017   PCA and DES to mRCA--needs DAPT for at least 6 mo.   Procedure: LEFT HEART CATH AND CORONARY ANGIOGRAPHY;  Surgeon: Sherren Mocha, MD;  Location: [place] [facility];  Service: Cardiovascular;  Laterality: N/A;  . PARTIAL HIP ARTHROPLASTY Left 2010   left  . RENAL BIOPSY    . stents     05-2010 and 2- in 2010 and 1 in 2018.  [name] TOTAL HIP ARTHROPLASTY  07/09/2011   Procedure: TOTAL HIP ARTHROPLASTY;  Surgeon: Kerin Salen, MD;  Location: [place];  Service: Orthopedics;  Laterality: Right;  . TYMPANOSTOMY TUBE PLACEMENT  53 yrs old  . US ECHOCARDIOGRAPHY  12/2010; 02/2014;08/2014   02/2014 EF still 25-30%, increased PA pressures.  2016 EF 30-35%, sept/inf hypokinesis, mod tricusp regurg    Family History  Problem Relation Age of Onset  . Kidney failure Mother   . Lupus Mother   . Stroke Mother   . Diabetes Mother        type 2  . Heart attack Father        X 7  . Hypertension Father   . Hyperlipidemia Father   . Heart attack Sister 95       X 1  .  Hyperlipidemia Sister   . Hypertension Sister   . Heart disease Sister   . Heart attack Paternal Grandfather   . Heart disease Paternal Aunt   . Heart disease Paternal Uncle   . Colon cancer Neg Hx   . Esophageal cancer Neg Hx   . Stomach cancer Neg Hx   . Rectal cancer Neg Hx     Social History   Socioeconomic History  . Marital status: Married    Spouse name: Not on file  . Number  of children: Not on file  . Years of education: Not on file  . Highest education level: Not on file  Occupational History  . Occupation: CAD Drafter     Employer: [employer]  Social Needs  . Financial resource strain: Not on file  . Food insecurity:    Worry: Not on file    Inability: Not on file  . Transportation needs:    Medical: Not on file    Non-medical: Not on file  Tobacco Use  . Smoking status: Former Smoker    Packs/day: 0.50    Years: 30.00    Pack years: 15.00    Types: Cigarettes    Last attempt to quit: 08/02/2010    Years since quitting: 7.9  . Smokeless tobacco: Never Used  Substance and Sexual Activity  . Alcohol use: No    Alcohol/week: 0.0 standard drinks  . Drug use: No  . Sexual activity: Yes    Partners: Female  Lifestyle  . Physical activity:    Days per week: Not on file    Minutes per session: Not on file  . Stress: Not on file  Relationships  . Social connections:    Talks on phone: Not on file    Gets together: Not on file    Attends religious service: Not on file    Active member of club or organization: Not on file    Attends meetings of clubs or organizations: Not on file    Relationship status: Not on file  . Intimate partner violence:    Fear of current or ex partner: Not on file    Emotionally abused: Not on file    Physically abused: Not on file    Forced sexual activity: Not on file  Other Topics Concern  . Not on file  Social History Narrative   Married, 1 teenage son and 1 teenage daughter.    Occupation: Printmaker.   15 pack-yr  smoking hx, quit 07/2010.   Drug Use - no   No alcohol.             Outpatient Medications Prior to Visit  Medication Sig Dispense Refill  . acetaminophen (TYLENOL) 500 MG tablet Take 1,000 mg by mouth every 6 (six) hours as needed for headache (pain).     [name] allopurinol (ZYLOPRIM) 100 MG tablet Take 1 tablet (100 mg total) by mouth daily. 90 tablet 3  . aspirin EC 81 MG tablet Take 81 mg by mouth daily.    [name] atorvastatin (LIPITOR) 80 MG tablet Take 80 mg by mouth at bedtime.    [name] BYSTOLIC 5 MG tablet TAKE 1 TABLET BY MOUTH DAILY 90 tablet 3  . clopidogrel (PLAVIX) 75 MG tablet TAKE 1 TABLET BY MOUTH DAILY 90 tablet 3  . DEXILANT 60 MG capsule TAKE 1 CAPSULE BY MOUTH DAILY BEFORE BREAKFAST 90 capsule 0  . famotidine (PEPCID) 20 MG tablet 1 tab po qhs 30 tablet 6  . losartan (COZAAR) 25 MG tablet Take 25 mg by mouth at bedtime.     . magnesium oxide (MAG-OX) 400 MG tablet Take 400 mg by mouth daily with lunch.     . mycophenolate (CELLCEPT) 250 MG capsule Take 3 capsules (750 mg total) by mouth 2 (two) times daily. 540 capsule 1  . niacin (NIASPAN) 500 MG CR tablet TAKE 2 TABLETS BY MOUTH AT BEDTIME 180 tablet 3  . nitroGLYCERIN (NITROSTAT) 0.4 MG SL tablet Place 0.4 mg under the tongue every 5 (five)  minutes as needed for chest pain (DO NOT EXCEED 3 DOSES).    . predniSONE (DELTASONE) 5 MG tablet TAKE 1 TABLET BY MOUTH DAILY 90 tablet 3  . tacrolimus (PROGRAF) 0.5 MG capsule Take 0.5 mg by mouth every 12 (twelve) hours.     . traMADol (ULTRAM) 50 MG tablet Take 50 mg by mouth every 6 (six) hours as needed. for pain  0  . zolpidem (AMBIEN) 5 MG tablet Take 5 mg by mouth at bedtime as needed for sleep.     No facility-administered medications prior to visit.     Allergies  Allergen Reactions  . Triptans Palpitations    Reaction to Maxalt    ROS Review of Systems  Constitutional: Negative for appetite change, chills, fatigue and fever.  HENT: Negative for congestion, dental  problem, ear pain and sore throat.   Eyes: Negative for discharge, redness and visual disturbance.  Respiratory: Positive for cough (lingering from recent URI/bronchitis). Negative for chest tightness, shortness of breath and wheezing.   Cardiovascular: Negative for chest pain, palpitations and leg swelling.  Gastrointestinal: Negative for abdominal pain, blood in stool, diarrhea, nausea and vomiting.  Genitourinary: Negative for difficulty urinating, dysuria, flank pain, frequency, hematuria and urgency.  Musculoskeletal: Negative for arthralgias, back pain, joint swelling, myalgias and neck stiffness.  Skin: Negative for pallor and rash.  Neurological: Negative for dizziness, speech difficulty, weakness and headaches.  Hematological: Negative for adenopathy. Does not bruise/bleed easily.  Psychiatric/Behavioral: Negative for confusion and sleep disturbance. The patient is not nervous/anxious.     PE; Blood pressure 130/72, pulse (!) 58, temperature 97.7 F (36.5 C), temperature source Oral, resp. rate 16, height 5' 7" (1.702 m), weight 217 lb 9.6 oz (98.7 kg), SpO2 98 %. Body mass index is 34.08 kg/m.  Gen: Alert, well appearing.  Patient is oriented to person, place, time, and situation. AFFECT: pleasant, lucid thought and speech. ENT: Ears: EACs clear, normal epithelium.  TMs with good light reflex and landmarks bilaterally.  Eyes: no injection, icteris, swelling, or exudate.  EOMI, PERRLA. Nose: no drainage or turbinate edema/swelling.  No injection or focal lesion.  Mouth: lips without lesion/swelling.  Oral mucosa pink and moist.  Dentition intact and without obvious caries or gingival swelling.  Oropharynx without erythema, exudate, or swelling.  Neck: supple/nontender.  No LAD, mass, or TM.  Carotid pulses 2+ bilaterally, without bruits. CV: RRR, no m/r/g.   LUNGS: CTA bilat, nonlabored resps, good aeration in all lung fields. ABD: soft, NT, ND, BS normal.  No hepatospenomegaly or  mass.  No bruits. EXT: no clubbing, cyanosis, or edema.  Musculoskeletal: no joint swelling, erythema, warmth, or tenderness.  ROM of all joints intact. Skin - no sores or suspicious lesions or rashes or color changes Rectal exam: negative without mass, lesions or tenderness, PROSTATE EXAM: smooth and symmetric without nodules or tenderness.   Pertinent labs:  Lab Results  Component Value Date   TSH 0.52 09/03/2013   Lab Results  Component Value Date   WBC 9.3 03/28/2018   HGB 16.2 03/28/2018   HCT 48.0 03/28/2018   MCV 90 03/28/2018   PLT 193 03/28/2018   Lab Results  Component Value Date   CREATININE 1.35 (H) 03/28/2018   BUN 15 03/28/2018   NA 141 03/28/2018   K 5.0 03/28/2018   CL 101 03/28/2018   CO2 26 03/28/2018   Lab Results  Component Value Date   ALT 14 01/21/2018   AST 18 01/21/2018   ALKPHOS  109 01/21/2018   BILITOT 2.2 (H) 07/17/2016   Lab Results  Component Value Date   CHOL 114 01/21/2018   Lab Results  Component Value Date   HDL 67 01/21/2018   Lab Results  Component Value Date   LDLCALC 35 01/21/2018   Lab Results  Component Value Date   TRIG 60 01/21/2018   Lab Results  Component Value Date   CHOLHDL 2 02/27/2017   Lab Results  Component Value Date   PSA 1.71 10/23/2010   Lab Results  Component Value Date   HGBA1C 5.7 10/15/2017   ASSESSMENT AND PLAN:   Health maintenance exam: Reviewed age and gender appropriate health maintenance issues (prudent diet, regular exercise, health risks of tobacco and excessive alcohol, use of seatbelts, fire alarms in home, use of sunscreen).  Also reviewed age and gender appropriate health screening as well as vaccine recommendations. Vaccines: all UTD. Labs: FLP, PSA.  Pt has gotten CMET, A1c, CBC, and TSH through his [facility] transplant clinic MDs at recent f/u there. Will request these labs. Prostate ca screening: DRE normal , PSA today. Colon ca screening: due for initial screening  colonoscopy-->he just has to call GI b/c he is already established.  An After Visit Summary was printed and given to the patient.  FOLLOW UP:  Return in about 3 months (around 10/15/2018) for routine chronic illness f/u.  Signed:  Crissie Sickles, MD           07/15/2018

## 2018-07-15 NOTE — Patient Instructions (Signed)

## 2018-07-17 LAB — LIPID PANEL
Cholesterol: 113 (ref 0–200)
HDL: 57 (ref 35–70)
LDL Cholesterol: 40
Triglycerides: 82 (ref 40–160)

## 2018-07-17 LAB — HEPATIC FUNCTION PANEL
ALK PHOS: 117 (ref 25–125)
ALT: 17 (ref 10–40)
AST: 20 (ref 14–40)
Bilirubin, Total: 0.8

## 2018-07-17 LAB — CBC AND DIFFERENTIAL
HCT: 49 (ref 41–53)
HEMOGLOBIN: 15.9 (ref 13.5–17.5)
Neutrophils Absolute: 5
Platelets: 200 (ref 150–399)
WBC: 7.4

## 2018-07-17 LAB — BASIC METABOLIC PANEL
BUN: 15 (ref 4–21)
Creatinine: 1.2 (ref 0.6–1.3)
Glucose: 118
Potassium: 4.7 (ref 3.4–5.3)
Sodium: 139 (ref 137–147)

## 2018-07-18 ENCOUNTER — Encounter: Payer: Self-pay | Admitting: Family Medicine

## 2018-07-22 ENCOUNTER — Encounter: Payer: Self-pay | Admitting: Family Medicine

## 2018-07-23 LAB — CUP PACEART INCLINIC DEVICE CHECK
Battery Remaining Longevity: 136 mo
Battery Voltage: 3.14 V
Date Time Interrogation Session: 20200302223557
HighPow Impedance: 49 Ohm
HighPow Impedance: 62 Ohm
Implantable Lead Implant Date: 20120709
Implantable Lead Location: 753860
Implantable Lead Model: 6947
Implantable Pulse Generator Implant Date: 20191129
Lead Channel Impedance Value: 285 Ohm
Lead Channel Pacing Threshold Amplitude: 1.25 V
Lead Channel Pacing Threshold Pulse Width: 0.5 ms
Lead Channel Sensing Intrinsic Amplitude: 13.875 mV
Lead Channel Setting Pacing Pulse Width: 0.5 ms
MDC IDC MSMT LEADCHNL RV IMPEDANCE VALUE: 361 Ohm
MDC IDC SET LEADCHNL RV PACING AMPLITUDE: 2.5 V
MDC IDC SET LEADCHNL RV SENSING SENSITIVITY: 0.3 mV
MDC IDC STAT BRADY RV PERCENT PACED: 0.23 %

## 2018-07-31 ENCOUNTER — Other Ambulatory Visit: Payer: Self-pay | Admitting: Family Medicine

## 2018-07-31 ENCOUNTER — Encounter: Payer: Self-pay | Admitting: Family Medicine

## 2018-08-13 ENCOUNTER — Other Ambulatory Visit: Payer: Self-pay | Admitting: Family Medicine

## 2018-09-23 ENCOUNTER — Other Ambulatory Visit: Payer: Self-pay

## 2018-09-23 ENCOUNTER — Ambulatory Visit (INDEPENDENT_AMBULATORY_CARE_PROVIDER_SITE_OTHER): Payer: Managed Care, Other (non HMO) | Admitting: *Deleted

## 2018-09-23 DIAGNOSIS — I255 Ischemic cardiomyopathy: Secondary | ICD-10-CM | POA: Diagnosis not present

## 2018-09-23 DIAGNOSIS — I5022 Chronic systolic (congestive) heart failure: Secondary | ICD-10-CM

## 2018-09-24 LAB — CUP PACEART REMOTE DEVICE CHECK
Battery Remaining Longevity: 136 mo
Battery Remaining Longevity: 135 mo
Battery Voltage: 3.1 V
Battery Voltage: 3.1 V
Brady Statistic RV Percent Paced: 0.08 %
Brady Statistic RV Percent Paced: 0.13 %
Date Time Interrogation Session: 20200506013406
Date Time Interrogation Session: 20200513113457
HighPow Impedance: 47 Ohm
HighPow Impedance: 58 Ohm
HighPow Impedance: 45 Ohm
HighPow Impedance: 54 Ohm
Implantable Lead Implant Date: 20120709
Implantable Lead Implant Date: 20120709
Implantable Lead Location: 753860
Implantable Lead Location: 753860
Implantable Lead Model: 6947
Implantable Lead Model: 6947
Implantable Pulse Generator Implant Date: 20191129
Implantable Pulse Generator Implant Date: 20191129
Lead Channel Impedance Value: 285 Ohm
Lead Channel Impedance Value: 361 Ohm
Lead Channel Impedance Value: 247 Ohm
Lead Channel Impedance Value: 361 Ohm
Lead Channel Pacing Threshold Amplitude: 1.375 V
Lead Channel Pacing Threshold Amplitude: 1.375 V
Lead Channel Pacing Threshold Pulse Width: 0.4 ms
Lead Channel Pacing Threshold Pulse Width: 0.4 ms
Lead Channel Sensing Intrinsic Amplitude: 13.125 mV
Lead Channel Sensing Intrinsic Amplitude: 13.125 mV
Lead Channel Sensing Intrinsic Amplitude: 14.5 mV
Lead Channel Sensing Intrinsic Amplitude: 14.5 mV
Lead Channel Setting Pacing Amplitude: 2.5 V
Lead Channel Setting Pacing Amplitude: 2.5 V
Lead Channel Setting Pacing Pulse Width: 0.5 ms
Lead Channel Setting Pacing Pulse Width: 0.5 ms
Lead Channel Setting Sensing Sensitivity: 0.3 mV
Lead Channel Setting Sensing Sensitivity: 0.3 mV

## 2018-10-08 NOTE — Progress Notes (Signed)
Remote ICD transmission.   

## 2018-10-28 ENCOUNTER — Other Ambulatory Visit: Payer: Self-pay | Admitting: Family Medicine

## 2018-12-23 ENCOUNTER — Ambulatory Visit (INDEPENDENT_AMBULATORY_CARE_PROVIDER_SITE_OTHER): Payer: Managed Care, Other (non HMO) | Admitting: *Deleted

## 2018-12-23 DIAGNOSIS — I509 Heart failure, unspecified: Secondary | ICD-10-CM | POA: Diagnosis not present

## 2018-12-23 DIAGNOSIS — I255 Ischemic cardiomyopathy: Secondary | ICD-10-CM

## 2018-12-23 LAB — CUP PACEART REMOTE DEVICE CHECK
Battery Remaining Longevity: 134 mo
Battery Voltage: 3.05 V
Brady Statistic RV Percent Paced: 0.07 %
Date Time Interrogation Session: 20200811052405
HighPow Impedance: 48 Ohm
HighPow Impedance: 61 Ohm
Implantable Lead Implant Date: 20120709
Implantable Lead Location: 753860
Implantable Lead Model: 6947
Implantable Pulse Generator Implant Date: 20191129
Lead Channel Impedance Value: 304 Ohm
Lead Channel Impedance Value: 399 Ohm
Lead Channel Pacing Threshold Amplitude: 1.25 V
Lead Channel Pacing Threshold Pulse Width: 0.4 ms
Lead Channel Sensing Intrinsic Amplitude: 13.125 mV
Lead Channel Sensing Intrinsic Amplitude: 13.125 mV
Lead Channel Setting Pacing Amplitude: 2.5 V
Lead Channel Setting Pacing Pulse Width: 0.5 ms
Lead Channel Setting Sensing Sensitivity: 0.3 mV

## 2019-01-02 NOTE — Progress Notes (Signed)
Remote ICD transmission.   

## 2019-01-28 ENCOUNTER — Encounter: Payer: Self-pay | Admitting: Internal Medicine

## 2019-01-29 ENCOUNTER — Other Ambulatory Visit: Payer: Self-pay | Admitting: Family Medicine

## 2019-01-29 ENCOUNTER — Other Ambulatory Visit: Payer: Self-pay

## 2019-01-29 MED ORDER — FAMOTIDINE 20 MG PO TABS: 20.0000 mg | ORAL_TABLET | Freq: Every day | ORAL | 0 refills | Status: DC

## 2019-01-30 ENCOUNTER — Ambulatory Visit: Payer: Managed Care, Other (non HMO) | Admitting: Internal Medicine

## 2019-01-30 ENCOUNTER — Encounter: Payer: Self-pay | Admitting: Internal Medicine

## 2019-01-30 ENCOUNTER — Other Ambulatory Visit: Payer: Self-pay

## 2019-01-30 DIAGNOSIS — K219 Gastro-esophageal reflux disease without esophagitis: Secondary | ICD-10-CM | POA: Diagnosis not present

## 2019-01-30 DIAGNOSIS — G4733 Obstructive sleep apnea (adult) (pediatric): Secondary | ICD-10-CM

## 2019-01-30 DIAGNOSIS — I255 Ischemic cardiomyopathy: Secondary | ICD-10-CM

## 2019-01-30 NOTE — Assessment & Plan Note (Signed)
Managed by cardiology. ICD updated.

## 2019-01-30 NOTE — Assessment & Plan Note (Signed)
He recognizes occasional aspiration event with subsequent cough for several days. Importance of keeping upcoming f/u with GI stressed.

## 2019-01-30 NOTE — Assessment & Plan Note (Signed)
He benefits and comfortable with CPAP. Download good. Plan- continue CPAP auto 5-12

## 2019-01-30 NOTE — Patient Instructions (Addendum)
We can continue CPAP auto 5-12, mask of choice, humidifier, supplies, AirView/ card  We can send you to the sleep center for mask fiting at anytime if the leak becomes more of an issue.  Please call if we can help

## 2019-01-30 NOTE — Progress Notes (Signed)
Virtual Visit via Video Note   This visit type was conducted due to national recommendations for restrictions regarding the COVID-19 Pandemic (e.g. social distancing) in an effort to limit this patient's exposure and mitigate transmission in our community.  Due to his co-morbid illnesses, this patient is at least at moderate risk for complications without adequate follow up.  This format is felt to be most appropriate for this patient at this time.  All issues noted in this document were discussed and addressed.  A limited physical exam was performed with this format.  Please refer to the patient's chart for his consent to telehealth for [facility].    Date:  01/30/2019   ID:  DILLIN LOFGREN, DOB 04-07-1966, MRN [id number]  Patient Location: Home Provider Location: Home  PCP:  [name], MD  Cardiologist:  [name], MD  Electrophysiologist:  None   Evaluation Performed:  Follow-Up Visit  Chief Complaint:  Follow up for CAD, seen for Dr. Johnsie Cancel   History of Present Illness:    Roy Dyer is a 53 y.o. male with a history of CAD s/p MI in 2001 with remote stenting to the RCA and circumflex and BMS to LAD in 2012. Last cardiac catheterization 02/08/2017 for angina which showed 90% lesion in the mid RCA in which DES was placed, LAD and LCx stents were widely patent. Echocardiogram 04/2016 with LVEF 30% s/p AICD placement per [name]. Also with previous mural apical thrombus who is no longer on anticoagulation, post renal transplant at [facility] in 2012 (donor), history of lupus, OSA on CPAP (followed by Dr. Annamaria Boots), chronic renal insufficiency and Barrett's, seen for Dr. Johnsie Cancel.   Was last seen by [name] on 07/14/2018 for follow-up of ICD implantation for primary prevention of ischemic cardiomyopathy.  Defibrillator was interrogated personally by [name] at that time with no changes in programming.  Last interrogation 12/23/2018 with normal device function.   Today, Mr. Lacko is  doing well from a cardiac perspective.  He has been staying safe from [place], only going out as necessary.  He has no anginal symptoms, no palpitations, shortness of breath, dyspnea, LE swelling, orthopnea, dizziness or syncope.  We discussed repeating his echocardiogram as it has been since 2017 since last LV assessment.  Also discussed his Plavix therapy.  Per cath report, in 2018 he had in-stent restenosis and therefore likely the reason his Plavix has not been stopped.  Also asking about niacin therapy as this was brought up by [name] at his last appointment.  He reports getting lab work every 3 months by either nephrology here in [place] or at [place].  No specific complaints today  The patient does not have symptoms concerning for COVID-19 infection (fever, chills, cough, or new shortness of breath).    Past Medical History:  Diagnosis Date  . AICD (automatic cardioverter/defibrillator) present    Dr. Paschal Dopp follows remotely-yrly checks, Dr. Jonne Ply  . Avascular necrosis of bone of hip ([place]) 2010 surg   Left hip arthroplasty: from chronic systemic steroids taken for his Lupus  . Barrett's esophagus   . Blood transfusion without reported diagnosis 2010, 2012  . CAD (coronary artery disease)    a. stents RCA/Circ 2001 b. BMS to LAD 07/2010  c. 01/2017: s/p DES to RCA.   . Cataract    left eye  . CHF (congestive heart failure) ([place])   . Chronic headaches 02/2017   As of 01/2017, HA's no better, neurologist ordered CT.  ESR normal at that time.  HA'S COMPLETELY RESOLVED AFTER HE GOT ON CPAP 2019/2020.  [name] Chronic renal insufficiency, stage II (mild)    GFR 65 ml/min 07/19/15 at local renal f/u (Cr 1.29).  GFR 64 ml/min (cr 1.3) at local renal f/u 01/16/17.  [name] GERD (gastroesophageal reflux disease)    Hx of esoph stricture and dilatation.  +Barrett's esophagus+hx of aspiration pneumonia-->to get rpt EGD when he comes off DAPT (utd as of 01/29/18)  . Gout    s/p renal transplant he was  weened off of his allopurinol.  . Hiatal hernia   . 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 History of renal transplant 11/10/2012   10/2012 [facility]   . Hyperlipidemia   . Hypertension   . IFG (impaired fasting glucose)   . implantable cardiac defibrillator single chamber    Medtronic (due to low EF)  . Ischemic cardiomyopathy    Chronic systolic dysfunction--  28/4132 EF 30-35%.   Single chamber ICD 11/20/10 ([name])  . Left ventricular thrombus 2012   Re-eval 02/2011 showed thrombus RESOLVED, so coumadin was d/c'd (was on it for 44mo  . Lupus ([place]   . OSA on CPAP 2018   Dr. [name]9/2018: +OSA on home sleep studay; CPAP auto titrate 5-20 started 05/2017.  . Osteoporosis   . Post-transplant erythrocytosis    improved with ARB (valsartan)   Past Surgical History:  Procedure Laterality Date  . AV FISTULA PLACEMENT  03/28/2011   Procedure: ARTERIOVENOUS (AV) FISTULA CREATION;  Surgeon: TRosetta Posner MD;  [facility]  Service: Vascular;  Laterality: Left;  Creation of Left radiocephallic cimino fistula  . AV FISTULA PLACEMENT  04/27/2011   Procedure: ARTERIOVENOUS (AV) FISTULA CREATION;  Surgeon: TRosetta Posner MD;  [place] [facility]  Service: Vascular;  Laterality: Left;  left basilic vein transposition  . CARDIAC CATHETERIZATION     08/2010  . CARDIOVASCULAR STRESS TEST  07/2010; 03/2014   03/2014 showed large old infarct and EF 30-35% but no ischemia  . DEXA  07/05/2017   Normal bone density in radius and spine.  . ESOPHAGOGASTRODUODENOSCOPY  02/2009   Done by Dr. SFuller Planfor hematemesis; esophagitis found.  Repeat recommended 09/2016, at which time he will also likely get his initial screening colonoscopy.  . ESOPHAGOGASTRODUODENOSCOPY (EGD) WITH PROPOFOL N/A 09/29/2015   REPEAT EGD RECOMMENDED 09/2016.  Barrett's esoph + mild chronic gastritis.  H pylori NEG. Procedure: ESOPHAGOGASTRODUODENOSCOPY (EGD) WITH  PROPOFOL;  Surgeon: JJerene Bears MD;  [facility];  Service: Gastroenterology;  Laterality: N/A;  . ICD GENERATOR CHANGEOUT N/A 04/11/2018   Procedure: ICD GENERATOR CHANGEOUT;  Surgeon: KDeboraha Sprang MD;  [facility];  Service: Cardiovascular;  Laterality: N/A;  . INSERT / REPLACE / REMOVE PACEMAKER     medtronic        dr nFrances Nickels   [place]   icd only  . KIDNEY TRANSPLANT  10/2012   [facility] nephrologist--Dr. MBlair Heys . LEFT HEART CATH AND CORONARY ANGIOGRAPHY N/A 02/08/2017   PCA and DES to mRCA--needs DAPT for at least 6 mo.   Procedure: LEFT HEART CATH AND CORONARY ANGIOGRAPHY;  Surgeon: CSherren Mocha MD;  [place];  Service: Cardiovascular;  Laterality: N/A;  . PARTIAL HIP ARTHROPLASTY Left 2010   left  . RENAL BIOPSY    . stents     05-2010  and 2- in 2010 and 1 in 2018.  [name] TOTAL HIP ARTHROPLASTY  07/09/2011   Procedure: TOTAL HIP ARTHROPLASTY;  Surgeon: Kerin Salen, MD;  Location: [place];  Service: Orthopedics;  Laterality: Right;  . TYMPANOSTOMY TUBE PLACEMENT  53 yrs old  . US ECHOCARDIOGRAPHY  12/2010; 02/2014;08/2014   02/2014 EF still 25-30%, increased PA pressures.  2016 EF 30-35%, sept/inf hypokinesis, mod tricusp regurg     No outpatient medications have been marked as taking for the 02/02/19 encounter (Appointment) with Tommie Raymond, NP.     Allergies:   Triptans   Social History   Tobacco Use  . Smoking status: Former Smoker    Packs/day: 0.50    Years: 30.00    Pack years: 15.00    Types: Cigarettes    Quit date: 08/02/2010    Years since quitting: 8.5  . Smokeless tobacco: Never Used  Substance Use Topics  . Alcohol use: No    Alcohol/week: 0.0 standard drinks  . Drug use: No     Family Hx: The patient's family history includes Diabetes in his mother; Heart attack in his father and paternal grandfather; Heart attack (age of onset: 70) in his sister; Heart disease in his paternal aunt, paternal  uncle, and sister; Hyperlipidemia in his father and sister; Hypertension in his father and sister; Kidney failure in his mother; Lupus in his mother; Stroke in his mother. There is no history of Colon cancer, Esophageal cancer, Stomach cancer, or Rectal cancer.  ROS:   Please see the history of present illness.     All other systems reviewed and are negative.  Prior CV studies:   The following studies were reviewed today:  LEFT HEART CATH AND CORONARY ANGIOGRAPHY 01/2017  Conclusion     Ost RCA to Prox RCA lesion, 0 %stenosed.  Dist RCA lesion, 75 %stenosed.  Prox LAD to Mid LAD lesion, 20 %stenosed.  Ost 1st Mrg to 1st Mrg lesion, 40 %stenosed.  A STENT SYNERGY DES 3X28 drug eluting stent was successfully placed.  Mid RCA lesion, 90 %stenosed.  Post intervention, there is a 0% residual stenosis.  1. Severe calcific RCA stenosis treated successfully with PCI (implantation of a 3.0x28 mm Synergy DES) 2. Patent LAD stent with mild in-stent restenosis 3. Patent LCx stent with mild in-stent restenosis  Recommend: continue DAPT long-term as tolerated (minimum 6 months without interruption)    Echocardiogram 04/23/2016 Study Conclusions  - Left ventricle: The cavity size was moderately dilated. Wall thickness was increased in a pattern of mild LVH. The estimated ejection fraction was 30%. Left ventricular diastolic function parameters were normal. - Left atrium: The atrium was severely dilated. - Atrial septum: No defect or patent foramen ovale was identified. - Pulmonary arteries: PA peak pressure: 55 mm Hg (S).    Labs/Other Tests and Data Reviewed:    EKG:  No ECG reviewed.  Recent Labs: 07/17/2018: ALT 17; BUN 15; Creatinine 1.2; Hemoglobin 15.9; Platelets 200; Potassium 4.7; Sodium 139   Recent Lipid Panel Lab Results  Component Value Date/Time   CHOL 113 07/17/2018   TRIG 82 07/17/2018   HDL 57 07/17/2018   CHOLHDL 2 07/15/2018 09:17 AM    LDLCALC 40 07/17/2018    Wt Readings from Last 3 Encounters:  01/30/19 232 lb (105.2 kg)  07/15/18 217 lb 9.6 oz (98.7 kg)  07/14/18 219 lb 9.6 oz (99.6 kg)     Objective:    Vital Signs:  There were no vitals taken  for this visit.   VITAL SIGNS:  reviewed GEN:  no acute distress EYES:  sclerae anicteric, EOMI - Extraocular Movements Intact RESPIRATORY:  normal respiratory effort, symmetric expansion SKIN:  no rash, lesions or ulcers. MUSCULOSKELETAL:  no obvious deformities. NEURO:  alert and oriented x 3, no obvious focal deficit PSYCH:  normal affect  ASSESSMENT & PLAN:    1.  CAD with prior MI x2 and ischemic cardiomyopathy: -Denies anginal of HF symptoms -Last echocardiogram 04/2016 with LVEF at 30%>> continue with medical treatment -Has AICD placed for primary prevention, follows with [name] -Last interrogation as above, 12/23/2018 with normal device function -We will repeat echocardiogram prior to next office visit -Continue ASA, Plavix, atorvastatin, losartan  2.  CKD with prior renal transplant: -Follows with nephrology, Dr. Clover Mealy -Creatinine, 1.2 on 07/17/2018 -Continue prednisone, Prograf and CellCept  3.  HLD: -Last LDL, 40 on 07/17/2018 -Continue statin, niacin therapy -Denies side effects  4.  GERD: -Has known hiatal hernia>> continue with current regimen -Follows with GI   COVID-19 Education: The signs and symptoms of COVID-19 were discussed with the patient and how to seek care for testing (follow up with PCP or arrange E-visit). The importance of social distancing was discussed today.  Time:   Today, I have spent 20 minutes with the patient with telehealth technology discussing the above problems.     Medication Adjustments/Labs and Tests Ordered: Current medicines are reviewed at length with the patient today.  Concerns regarding medicines are outlined above.   Tests Ordered: No orders of the defined types were placed in this encounter.    Medication Changes: No orders of the defined types were placed in this encounter.   Follow Up:  In Person 6 months with Dr. Johnsie Cancel   Signed, Kathyrn Drown, NP  01/30/2019 1:10 PM    [place]

## 2019-01-30 NOTE — Progress Notes (Signed)
HPI male former smoker followed for OSA, complicated by Lupus, HBP, CAD, MI/CM/CHF/AICD, renal transplant, GERD/Barrett's HST 04/24/17-AHI 15/hour, desaturation to 84%, body weight 221 pounds  ------------------------------------------------------------------------------------------------  09/05/17- 53 year old male former smoker followed for OSA, complicated by Lupus, HBP, CAD, MI/CM/CHF/AICD, renal transplant, GERD/Barrett's CPAP auto 5-20/ [facility] ----OSA; DME: [facility]. Pt wears CPAP but feel pressure is too high-DL attached. ? Mask fit at sleep center to find full face mask instead of nasal.  Download 97%, AHI 2.4/ hr He likes his nasal mask.  He worked with the DME but could not get fullface mask to seal with his beard.  Pressures over 12 push his mouth open.  Has a chinstrap but it has to be too tight to keep mouth closed.  01/30/2019- 53 year old male former smoker followed for OSA, complicated by Lupus, HBP, CAD, MI/CM/CHF/AICD, renal transplant, GERD/Barrett's CPAP auto 5-12/ [facility] Download compliance 90%, AHI 2.6/ hr. -----Follows for: OSA Body weight today 232 lbs Had flu vax He is very comfortable with CPAP. Download reviewed. Some leak but it doesn't bother him. Pressure was reduced to help. Mask fitting option discussed and offered. GERD with occasional recognized aspiration, after which he coughs for a few days. Pending appt with his GI.  Had Flu vax. Kidney transplant still doing well. ICD battery replaced this year.   ROS-see HPI   + = Positive Constitutional:    weight loss, night sweats, fevers, chills, fatigue, lassitude. HEENT:    + headaches, difficulty swallowing, tooth/dental problems, sore throat,       sneezing, itching, ear ache, nasal congestion, post nasal drip, snoring CV:    chest pain, orthopnea, PND, swelling in lower extremities, anasarca,                                                    dizziness, palpitations Resp:   shortness of breath with exertion or at rest.                 productive cough,   non-productive cough, coughing up of blood.              change in color of mucus.  wheezing.   Skin:    rash or lesions. GI:  No-   heartburn, indigestion, abdominal pain, nausea, vomiting, diarrhea,                 change in bowel habits, loss of appetite GU: dysuria, change in color of urine, no urgency or frequency.   flank pain. MS:   joint pain, stiffness, decreased range of motion, back pain. Neuro-     nothing unusual Psych:  change in mood or affect.  depression or anxiety.   memory loss.  OBJ- Physical Exam General- Alert, Oriented, Affect-appropriate, Distress- none acute, + obese Skin- rash-none, lesions- none, excoriation- none Lymphadenopathy- none Head- atraumatic            Eyes- Gross vision intact, PERRLA, conjunctivae and secretions clear            Ears- Hearing, canals-normal            Nose- Clear, no-Septal dev, mucus, polyps, erosion, perforation             Throat- Mallampati II , mucosa clear , drainage- none, tonsils- atrophic Neck- flexible , trachea midline, no stridor , thyroid nl, carotid  no bruit Chest - symmetrical excursion , unlabored           Heart/CV- RRR , no murmur , no gallop  , no rub, nl s1 s2                           - JVD- none , edema- none, stasis changes- none, varices- none           Lung- clear to P&A, wheeze- none, cough- none , dullness-none, rub- none           Chest wall- + AICD R Abd-  Br/ Gen/ Rectal- Not done, not indicated Extrem- cyanosis- none, clubbing, none, atrophy- none, strength- nl Neuro- grossly intact to observation

## 2019-02-02 ENCOUNTER — Other Ambulatory Visit: Payer: Self-pay

## 2019-02-02 ENCOUNTER — Encounter: Payer: Self-pay | Admitting: Cardiology

## 2019-02-02 ENCOUNTER — Telehealth (INDEPENDENT_AMBULATORY_CARE_PROVIDER_SITE_OTHER): Payer: Managed Care, Other (non HMO) | Admitting: Cardiology

## 2019-02-02 VITALS — BP 129/57 | HR 63 | Ht 67.0 in | Wt 225.0 lb

## 2019-02-02 DIAGNOSIS — I255 Ischemic cardiomyopathy: Secondary | ICD-10-CM | POA: Diagnosis not present

## 2019-02-02 DIAGNOSIS — I2510 Atherosclerotic heart disease of native coronary artery without angina pectoris: Secondary | ICD-10-CM | POA: Diagnosis not present

## 2019-02-02 DIAGNOSIS — Z95810 Presence of automatic (implantable) cardiac defibrillator: Secondary | ICD-10-CM | POA: Diagnosis not present

## 2019-02-02 DIAGNOSIS — I10 Essential (primary) hypertension: Secondary | ICD-10-CM

## 2019-02-02 DIAGNOSIS — E782 Mixed hyperlipidemia: Secondary | ICD-10-CM

## 2019-02-02 NOTE — Patient Instructions (Signed)
Medication Instructions:  Your physician recommends that you continue on your current medications as directed. Please refer to the Current Medication list given to you today.  If you need a refill on your cardiac medications before your next appointment, please call your pharmacy.   Lab work: NONE  Testing/Procedures: Your physician has requested that you have an echocardiogram. Echocardiography is a painless test that uses sound waves to create images of your heart. It provides your doctor with information about the size and shape of your heart and how well your heart's chambers and valves are working. This procedure takes approximately one hour. There are no restrictions for this procedure.  Follow-Up: At [facility], you and your health needs are our priority.  As part of our continuing mission to provide you with exceptional heart care, we have created designated Provider Care Teams.  These Care Teams include your primary Cardiologist (physician) and Advanced Practice Providers (APPs -  Physician Assistants and Nurse Practitioners) who all work together to provide you with the care you need, when you need it. You will need a follow up appointment in 6 months.  Please call our office 2 months in advance to schedule this appointment.  You may see [name], MD or one of the following Advanced Practice Providers on your designated Care Team:   Truitt Merle, NP Cecilie Kicks, NP . Kathyrn Drown, NP  Any Other Special Instructions Will Be Listed Below (If Applicable).   Echocardiogram An echocardiogram is a procedure that uses painless sound waves (ultrasound) to produce an image of the heart. Images from an echocardiogram can provide important information about:  Signs of coronary artery disease (CAD).  Aneurysm detection. An aneurysm is a weak or damaged part of an artery wall that bulges out from the normal force of blood pumping through the body.  Heart size and shape. Changes in  the size or shape of the heart can be associated with certain conditions, including heart failure, aneurysm, and CAD.  Heart muscle function.  Heart valve function.  Signs of a past heart attack.  Fluid buildup around the heart.  Thickening of the heart muscle.  A tumor or infectious growth around the heart valves. Tell a health care provider about:  Any allergies you have.  All medicines you are taking, including vitamins, herbs, eye drops, creams, and over-the-counter medicines.  Any blood disorders you have.  Any surgeries you have had.  Any medical conditions you have.  Whether you are pregnant or may be pregnant. What are the risks? Generally, this is a safe procedure. However, problems may occur, including:  Allergic reaction to dye (contrast) that may be used during the procedure. What happens before the procedure? No specific preparation is needed. You may eat and drink normally. What happens during the procedure?   An IV tube may be inserted into one of your veins.  You may receive contrast through this tube. A contrast is an injection that improves the quality of the pictures from your heart.  A gel will be applied to your chest.  A wand-like tool (transducer) will be moved over your chest. The gel will help to transmit the sound waves from the transducer.  The sound waves will harmlessly bounce off of your heart to allow the heart images to be captured in real-time motion. The images will be recorded on a computer. The procedure may vary among health care providers and hospitals. What happens after the procedure?  You may return to your normal,  everyday life, including diet, activities, and medicines, unless your health care provider tells you not to do that. Summary  An echocardiogram is a procedure that uses painless sound waves (ultrasound) to produce an image of the heart.  Images from an echocardiogram can provide important information about the  size and shape of your heart, heart muscle function, heart valve function, and fluid buildup around your heart.  You do not need to do anything to prepare before this procedure. You may eat and drink normally.  After the echocardiogram is completed, you may return to your normal, everyday life, unless your health care provider tells you not to do that. This information is not intended to replace advice given to you by your health care provider. Make sure you discuss any questions you have with your health care provider. Document Released: 04/27/2000 Document Revised: 08/21/2018 Document Reviewed: 06/02/2016 Elsevier Patient Education  2020 [employer].

## 2019-03-04 ENCOUNTER — Ambulatory Visit: Payer: Self-pay | Admitting: *Deleted

## 2019-03-04 ENCOUNTER — Telehealth: Payer: Self-pay | Admitting: Family Medicine

## 2019-03-04 NOTE — Telephone Encounter (Signed)
I agree with triage of this patient to the ED for further evaluation. Signed:  Crissie Sickles, MD           03/04/2019

## 2019-03-04 NOTE — Telephone Encounter (Signed)
I agree with triage of this pt to the ED for further evaluation. Signed:  Crissie Sickles, MD           03/04/2019

## 2019-03-04 NOTE — Telephone Encounter (Signed)
FYI. Please see below. There is a separate telephone note from [facility] triage nurse.

## 2019-03-04 NOTE — Telephone Encounter (Signed)
Pt called in after falling onto a 5 gallon bucket an hour and a half ago.   He got tangled in the hose while washing the car.  He is on blood thinners due to having had an MI with stents put in.  He denies any pain, shortness of breath, dizziness, weakness.   No broken skin.    Just the softball size knot under his left breast and a line on his stomach from the handle of the bucket.   He is keeping ice on it. I called Dr. Idelle Leech office and spoke with Hinton Dyer.    Pt is requesting not to have to go to the ED or come into the office if possible due to COVID-19 and his immunosuppressed state from having a renal transplant. They are going to check with Dr. Anitra Lauth and call the pt back.    Pt was agreeable to this plan.  I went over with him s/s to go to the ED:  Shortness of breath, pain, the swelling enlarges, dizziness or weakness develops.   He verbalized understanding and assured me he would go to the ED if he developed any symptoms.  I sent my notes to Dr. Idelle Leech office.   Reason for Disposition . [1] High-risk adult (e.g., age > 4, osteoporosis, chronic steroid use) AND [2] still hurts  Answer Assessment - Initial Assessment Questions 1. MECHANISM: "How did the injury happen?"     I'm overweight.   I was washing car.   I got tangled in the hose and fell onto a 5 gallon bucket hitting my chest and stomach.   I have a softball size knot.   I' m putting ice on it now.     I'm blood thinners.   For heart attacks.   2. ONSET: "When did the injury happen?" (Minutes or hours ago)     1 hr 1/2 ago 3. LOCATION: "Where on the chest is the injury located?"  The knot is under my left breast.     **4. APPEARANCE: "What does the injury look like?"     Softball sized.  Purple and blue where bucket hit.   5. BLEEDING: "Is there any bleeding now? If so, ask: How long has it been bleeding?"     No broken skin.    Had a kidney transplant.    I don't want to go anywhere.   6. SEVERITY: "Any difficulty  with breathing?"     No 7. SIZE: For cuts, bruises, or swelling, ask: "How large is it?" (e.g., inches or centimeters)     Softball sized knot. 8. PAIN: "Is there pain?" If so, ask: "How bad is the pain?"   (e.g., Scale 1-10; or mild, moderate, severe)     It feels tight but no pain. 9. TETANUS: For any breaks in the skin, ask: "When was the last tetanus booster?"     No breaks in skin 10. PREGNANCY: "Is there any chance you are pregnant?" "When was your last menstrual period?"       N/A  Protocols used: CHEST INJURY-A-AH

## 2019-03-04 NOTE — Telephone Encounter (Signed)
Please see phone note from [facility] triage nurse. RN making sure Dr. Anitra Lauth is aware, and is in agreement to plan she went over with patient. Patient was in agreement too.  High priority because of patient history. Please follow up with patient.   Thank you

## 2019-03-08 ENCOUNTER — Encounter (HOSPITAL_COMMUNITY): Payer: Self-pay

## 2019-03-08 ENCOUNTER — Emergency Department (HOSPITAL_COMMUNITY)
Admission: EM | Admit: 2019-03-08 | Discharge: 2019-03-08 | Disposition: A | Discharge status: Home / Self Care | Payer: Managed Care, Other (non HMO) | Attending: Emergency Medicine | Admitting: Emergency Medicine

## 2019-03-08 ENCOUNTER — Other Ambulatory Visit: Payer: Self-pay

## 2019-03-08 DIAGNOSIS — I2510 Atherosclerotic heart disease of native coronary artery without angina pectoris: Secondary | ICD-10-CM | POA: Insufficient documentation

## 2019-03-08 DIAGNOSIS — Z95810 Presence of automatic (implantable) cardiac defibrillator: Secondary | ICD-10-CM | POA: Diagnosis not present

## 2019-03-08 DIAGNOSIS — Y929 Unspecified place or not applicable: Secondary | ICD-10-CM | POA: Insufficient documentation

## 2019-03-08 DIAGNOSIS — S301XXA Contusion of abdominal wall, initial encounter: Secondary | ICD-10-CM | POA: Diagnosis not present

## 2019-03-08 DIAGNOSIS — Z940 Kidney transplant status: Secondary | ICD-10-CM | POA: Insufficient documentation

## 2019-03-08 DIAGNOSIS — I130 Hypertensive heart and chronic kidney disease with heart failure and stage 1 through stage 4 chronic kidney disease, or unspecified chronic kidney disease: Secondary | ICD-10-CM | POA: Diagnosis not present

## 2019-03-08 DIAGNOSIS — W19XXXA Unspecified fall, initial encounter: Secondary | ICD-10-CM | POA: Diagnosis not present

## 2019-03-08 DIAGNOSIS — Y939 Activity, unspecified: Secondary | ICD-10-CM | POA: Insufficient documentation

## 2019-03-08 DIAGNOSIS — N182 Chronic kidney disease, stage 2 (mild): Secondary | ICD-10-CM | POA: Insufficient documentation

## 2019-03-08 DIAGNOSIS — Z87891 Personal history of nicotine dependence: Secondary | ICD-10-CM | POA: Insufficient documentation

## 2019-03-08 DIAGNOSIS — S3991XA Unspecified injury of abdomen, initial encounter: Secondary | ICD-10-CM | POA: Diagnosis present

## 2019-03-08 DIAGNOSIS — Z79899 Other long term (current) drug therapy: Secondary | ICD-10-CM | POA: Diagnosis not present

## 2019-03-08 DIAGNOSIS — Y999 Unspecified external cause status: Secondary | ICD-10-CM | POA: Insufficient documentation

## 2019-03-08 DIAGNOSIS — Z7902 Long term (current) use of antithrombotics/antiplatelets: Secondary | ICD-10-CM | POA: Diagnosis not present

## 2019-03-08 DIAGNOSIS — I509 Heart failure, unspecified: Secondary | ICD-10-CM | POA: Insufficient documentation

## 2019-03-08 LAB — COMPREHENSIVE METABOLIC PANEL
ALT: 19 U/L (ref 0–44)
AST: 23 U/L (ref 15–41)
Albumin: 4.3 g/dL (ref 3.5–5.0)
Alkaline Phosphatase: 106 U/L (ref 38–126)
Anion gap: 13 (ref 5–15)
BUN: 13 mg/dL (ref 6–20)
CO2: 25 mmol/L (ref 22–32)
Calcium: 9.5 mg/dL (ref 8.9–10.3)
Chloride: 104 mmol/L (ref 98–111)
Creatinine, Ser: 1.37 mg/dL — ABNORMAL HIGH (ref 0.61–1.24)
GFR calc Af Amer: >60 mL/min (ref >60)
GFR calc non Af Amer: 59 mL/min — ABNORMAL LOW (ref >60)
Glucose, Bld: 150 mg/dL — ABNORMAL HIGH (ref 70–99)
Potassium: 4.1 mmol/L (ref 3.5–5.1)
Sodium: 142 mmol/L (ref 135–145)
Total Bilirubin: 1.6 mg/dL — ABNORMAL HIGH (ref 0.3–1.2)
Total Protein: 6.6 g/dL (ref 6.5–8.1)

## 2019-03-08 LAB — LIPASE, BLOOD: Lipase: 28 U/L (ref 11–51)

## 2019-03-08 LAB — CBC
HCT: 50.2 % (ref 39.0–52.0)
Hemoglobin: 16.7 g/dL (ref 13.0–17.0)
MCH: 30.9 pg (ref 26.0–34.0)
MCHC: 33.3 g/dL (ref 30.0–36.0)
MCV: 93 fL (ref 80.0–100.0)
Platelets: 194 10*3/uL (ref 150–400)
RBC: 5.4 MIL/uL (ref 4.22–5.81)
RDW: 13.7 % (ref 11.5–15.5)
WBC: 11.9 10*3/uL — ABNORMAL HIGH (ref 4.0–10.5)
nRBC: 0 % (ref 0.0–0.2)

## 2019-03-08 MED ORDER — SODIUM CHLORIDE 0.9% FLUSH: 3.0000 mL | Freq: Once | INTRAVENOUS | Status: DC

## 2019-03-08 NOTE — ED Triage Notes (Signed)
Patient is a renal transplant and fell onto bucket Wednesday on left abdomen. Has had ongoing mild discomfort, swelling and bruising to abdomen. Here to be evaluated for same. Reports mild cramping.

## 2019-03-08 NOTE — ED Notes (Signed)
Patient verbalizes understanding of discharge instructions. Opportunity for questioning and answers were provided. pt discharged from ED. Ambulatory by self  

## 2019-03-08 NOTE — ED Provider Notes (Signed)
[facility] EMERGENCY DEPARTMENT Provider Note   CSN: [id number] Arrival date & time: 03/08/19  4010     History   Chief Complaint Chief Complaint  Patient presents with  . Fall/ abd. pain/ transplant    HPI [name] is a 53 y.o. male.     The history is provided by the patient.  Fall This is a new problem. The current episode started more than 2 days ago (5 days). The problem has been resolved. Associated symptoms include abdominal pain (bruising). Pertinent negatives include no chest pain, no headaches and no shortness of breath. Nothing aggravates the symptoms. Nothing relieves the symptoms. He has tried nothing for the symptoms. The treatment provided no relief.    Past Medical History:  Diagnosis Date  . AICD (automatic cardioverter/defibrillator) present    Dr. Paschal Dopp follows remotely-yrly checks, Dr. Jonne Ply  . Avascular necrosis of bone of hip ([place]) 2010 surg   Left hip arthroplasty: from chronic systemic steroids taken for his Lupus  . Barrett's esophagus   . Blood transfusion without reported diagnosis 2010, 2012  . CAD (coronary artery disease)    a. stents RCA/Circ 2001 b. BMS to LAD 07/2010  c. 01/2017: s/p DES to RCA.   . Cataract    left eye  . CHF (congestive heart failure) ([place])   . Chronic headaches 02/2017   As of 01/2017, HA's no better, neurologist ordered CT.  ESR normal at that time.  HA'S COMPLETELY RESOLVED AFTER HE GOT ON CPAP 2019/2020.  [name] Chronic renal insufficiency, stage II (mild)    GFR 65 ml/min 07/19/15 at local renal f/u (Cr 1.29).  GFR 64 ml/min (cr 1.3) at local renal f/u 01/16/17.  [name] GERD (gastroesophageal reflux disease)    Hx of esoph stricture and dilatation.  +Barrett's esophagus+hx of aspiration pneumonia-->to get rpt EGD when he comes off DAPT (utd as of 01/29/18)  . Gout    s/p renal transplant he was weened off of his allopurinol.  . Hiatal hernia   . 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 History of renal transplant 11/10/2012   10/2012 [facility]   . Hyperlipidemia   . Hypertension   . IFG (impaired fasting glucose)   . implantable cardiac defibrillator single chamber    Medtronic (due to low EF)  . Ischemic cardiomyopathy    Chronic systolic dysfunction--  27/2536 EF 30-35%.   Single chamber ICD 11/20/10 ([name])  . Left ventricular thrombus 2012   Re-eval 02/2011 showed thrombus RESOLVED, so coumadin was d/c'd (was on it for 63mo  . [place]   . OSA on CPAP 2018   Dr. [name]9/2018: +OSA on home sleep studay; CPAP auto titrate 5-20 started 05/2017.  . Osteoporosis   . Post-transplant erythrocytosis    improved with ARB (valsartan)    Patient Active Problem List   Diagnosis Date Noted  . Obstructive sleep apnea 01/18/2017  . 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 Chronic renal insufficiency, stage II (mild)   . Cough with hemoptysis 07/15/2016  . Hemoptysis 07/15/2016  . Barrett's esophagus 04/17/2016  . Immunosuppression ([place] 04/17/2016  . GERD (gastroesophageal reflux disease) 12/31/2013  . History of renal transplant 09/02/2013  . S/P coronary artery stent placement 12/24/2011  . S/P ICD (internal cardiac defibrillator) procedure 12/24/2011  . Heart [place] ([place] 12/24/2011  . 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[place] 12/24/2011  . Coronary  artery disease with exertional angina ([place]) 09/12/2011  . Ulnar neuropathy of both upper extremities 07/30/2011  . Raynaud's disease 06/25/2011  . Automatic implantable cardioverter-defibrillator in situ 03/07/2011  . Erectile dysfunction 10/23/2010  . Mixed hyperlipidemia 08/10/2009  . Essential hypertension, benign 08/10/2009  . ischemic cardiomyopathy status post anterolateral MI 02/01/2009  . LUPUS 02/01/2009    Past Surgical History:  Procedure Laterality Date  . AV FISTULA PLACEMENT  03/28/2011   Procedure: ARTERIOVENOUS (AV) FISTULA  CREATION;  Surgeon: Rosetta Posner, MD;  Location: [facility];  Service: Vascular;  Laterality: Left;  Creation of Left radiocephallic cimino fistula  . AV FISTULA PLACEMENT  04/27/2011   Procedure: ARTERIOVENOUS (AV) FISTULA CREATION;  Surgeon: Rosetta Posner, MD;  Location: [place];  Service: Vascular;  Laterality: Left;  left basilic vein transposition  . CARDIAC CATHETERIZATION     08/2010  . CARDIOVASCULAR STRESS TEST  07/2010; 03/2014   03/2014 showed large old infarct and EF 30-35% but no ischemia  . DEXA  07/05/2017   Normal bone density in radius and spine.  . ESOPHAGOGASTRODUODENOSCOPY  02/2009   Done by Dr. Fuller Plan for hematemesis; esophagitis found.  Repeat recommended 09/2016, at which time he will also likely get his initial screening colonoscopy.  . ESOPHAGOGASTRODUODENOSCOPY (EGD) WITH PROPOFOL N/A 09/29/2015   REPEAT EGD RECOMMENDED 09/2016.  Barrett's esoph + mild chronic gastritis.  H pylori NEG. Procedure: ESOPHAGOGASTRODUODENOSCOPY (EGD) WITH PROPOFOL;  Surgeon: Jerene Bears, MD;  [facility];  Service: Gastroenterology;  Laterality: N/A;  . ICD GENERATOR CHANGEOUT N/A 04/11/2018   Procedure: ICD GENERATOR CHANGEOUT;  Surgeon: Deboraha Sprang, MD;  Location: [facility] CV LAB;  Service: Cardiovascular;  Laterality: N/A;  . INSERT / REPLACE / REMOVE PACEMAKER     medtronic        dr [name]    [place]   icd only  . KIDNEY TRANSPLANT  10/2012   [facility] nephrologist--Dr. Blair Heys  . LEFT HEART CATH AND CORONARY ANGIOGRAPHY N/A 02/08/2017   PCA and DES to mRCA--needs DAPT for at least 6 mo.   Procedure: LEFT HEART CATH AND CORONARY ANGIOGRAPHY;  Surgeon: Sherren Mocha, MD;  Location: [place] CV LAB;  Service: Cardiovascular;  Laterality: N/A;  . PARTIAL HIP ARTHROPLASTY Left 2010   left  . RENAL BIOPSY    . stents     05-2010 and 2- in 2010 and 1 in 2018.  [name] TOTAL HIP ARTHROPLASTY  07/09/2011   Procedure: TOTAL HIP ARTHROPLASTY;  Surgeon: Kerin Salen, MD;  Location: [facility];  Service: Orthopedics;  Laterality: Right;  . TYMPANOSTOMY TUBE PLACEMENT  53 yrs old  . US ECHOCARDIOGRAPHY  12/2010; 02/2014;08/2014   02/2014 EF still 25-30%, increased PA pressures.  2016 EF 30-35%, sept/inf hypokinesis, mod tricusp regurg        Home Medications    Prior to Admission medications   Medication Sig Start Date End Date Taking? Authorizing Provider  acetaminophen (TYLENOL) 500 MG tablet Take 1,000 mg by mouth every 6 (six) hours as needed for headache (pain).     [provider]  allopurinol (ZYLOPRIM) 100 MG tablet Take 1 tablet (100 mg total) by mouth daily. 03/22/16   McGowenAdrian Blackwater, MD  aspirin EC 81 MG tablet Take 81 mg by mouth daily.    [provider]  atorvastatin (LIPITOR) 80 MG tablet Take 80 mg by mouth at bedtime.    [provider]  BYSTOLIC 5 MG tablet TAKE  1 TABLET BY MOUTH DAILY 08/16/16   McGowen, [name], MD  clopidogrel (PLAVIX) 75 MG tablet TAKE 1 TABLET BY MOUTH DAILY 08/16/16   McGowen, [name], MD  DEXILANT 60 MG capsule TAKE 1 CAPSULE BY MOUTH DAILY BEFORE BREAKFAST 09/25/16   Pyrtle, Lajuan Lines, MD  famotidine (PEPCID) 20 MG tablet Take 1 tablet (20 mg total) by mouth at bedtime. 01/29/19   McGowen, [name], MD  losartan (COZAAR) 25 MG tablet Take 25 mg by mouth at bedtime.  01/03/17   [provider]  magnesium oxide (MAG-OX) 400 MG tablet Take 400 mg by mouth daily with lunch.     [provider]  mycophenolate (CELLCEPT) 250 MG capsule Take 3 capsules (750 mg total) by mouth 2 (two) times daily. 12/23/16   McGowen, [name], MD  niacin (NIASPAN) 500 MG CR tablet TAKE 2 TABLETS BY MOUTH AT BEDTIME 08/16/16   McGowen, [name], MD  nitroGLYCERIN (NITROSTAT) 0.4 MG SL tablet Place 0.4 mg under the tongue every 5 (five) minutes as needed for chest pain (DO NOT EXCEED 3 DOSES).    [provider]  predniSONE (DELTASONE) 5 MG tablet TAKE 1 TABLET BY MOUTH DAILY 10/24/16   McGowen, [name], MD  tacrolimus  (PROGRAF) 0.5 MG capsule Take 0.5 mg by mouth every 12 (twelve) hours.     [provider]  traMADol (ULTRAM) 50 MG tablet Take 50 mg by mouth every 6 (six) hours as needed. for pain 06/06/17   [provider]  zolpidem (AMBIEN) 5 MG tablet Take 5 mg by mouth at bedtime as needed for sleep.    [provider]    Family History Family History  Problem Relation Age of Onset  . Kidney failure Mother   . Lupus Mother   . Stroke Mother   . Diabetes Mother        type 2  . Heart attack Father        X 7  . Hypertension Father   . Hyperlipidemia Father   . Heart attack Sister 37       X 1  . Hyperlipidemia Sister   . Hypertension Sister   . Heart disease Sister   . Heart attack Paternal Grandfather   . Heart disease Paternal Aunt   . Heart disease Paternal Uncle   . Colon cancer Neg Hx   . Esophageal cancer Neg Hx   . Stomach cancer Neg Hx   . Rectal cancer Neg Hx     Social History Social History   Tobacco Use  . Smoking status: Former Smoker    Packs/day: 0.50    Years: 30.00    Pack years: 15.00    Types: Cigarettes    Quit date: 08/02/2010    Years since quitting: 8.6  . Smokeless tobacco: Never Used  Substance Use Topics  . Alcohol use: No    Alcohol/week: 0.0 standard drinks  . Drug use: No     Allergies   Triptans   Review of Systems Review of Systems  Constitutional: Negative for chills and fever.  HENT: Negative for ear pain and sore throat.   Eyes: Negative for pain and visual disturbance.  Respiratory: Negative for cough and shortness of breath.   Cardiovascular: Negative for chest pain and palpitations.  Gastrointestinal: Positive for abdominal pain (bruising). Negative for vomiting.  Genitourinary: Negative for dysuria and hematuria.  Musculoskeletal: Negative for arthralgias and back pain.  Skin: Positive for color change. Negative for  rash.  Neurological: Negative for seizures, syncope and headaches.  All other systems  reviewed and are negative.    Physical Exam Updated Vital Signs BP 138/78 (BP Location: Right Arm)   Pulse (!) 53   Temp 98.7 F (37.1 C) (Oral)   Resp (!) 24   SpO2 96%   Physical Exam Vitals signs and nursing note reviewed.  Constitutional:      Appearance: He is well-developed.  HENT:     Head: Normocephalic and atraumatic.     Nose: Nose normal.     Mouth/Throat:     Mouth: Mucous membranes are moist.  Eyes:     Extraocular Movements: Extraocular movements intact.     Conjunctiva/sclera: Conjunctivae normal.     Pupils: Pupils are equal, round, and reactive to light.  Neck:     Musculoskeletal: Normal range of motion and neck supple.  Cardiovascular:     Rate and Rhythm: Normal rate and regular rhythm.     Heart sounds: No murmur.  Pulmonary:     Effort: Pulmonary effort is normal. No respiratory distress.     Breath sounds: Normal breath sounds.  Abdominal:     General: Abdomen is flat. There is no distension.     Palpations: Abdomen is soft. There is no mass.     Tenderness: There is no abdominal tenderness. There is no guarding or rebound.     Hernia: No hernia is present.  Skin:    General: Skin is warm and dry.     Capillary Refill: Capillary refill takes less than 2 seconds.     Findings: Bruising present.     Comments: Extensive bruising through the upper abdomen with no obvious hematoma or other wound  Neurological:     Mental Status: He is alert.      ED Treatments / Results  Labs (all labs ordered are listed, but only abnormal results are displayed) Labs Reviewed  COMPREHENSIVE METABOLIC PANEL - Abnormal; Notable for the following components:      Result Value   Glucose, Bld 150 (*)    Creatinine, Ser 1.37 (*)    Total Bilirubin 1.6 (*)    GFR calc non Af Amer 59 (*)    All other components within normal limits  CBC - Abnormal; Notable for the following components:   WBC 11.9 (*)    All other components within normal limits  LIPASE, BLOOD     EKG None  Radiology No results found.  Procedures Procedures (including critical care time)  Medications Ordered in ED Medications  sodium chloride flush (NS) 0.9 % injection 3 mL (has no administration in time range)     Initial Impression / Assessment and Plan / ED Course  I have reviewed the triage vital signs and the nursing notes.  Pertinent labs & imaging results that were available during my care of the patient were reviewed by me and considered in my medical decision making (see chart for details).     Roy Dyer is a 53 year old male with history of CAD, renal disease status post transplant who presents to the ED after fall.  Patient with normal vitals.  No fever.  Patient fell about 5 days ago on a plastic bucket hit in his abdomen.  Was concerned because he is had extensive bruising.  Patient is on Plavix.  Denies any severe abdominal pain.  Has normal vitals.  Denies any hematuria.  Does not have any flank pain or pain by his transplant kidney.  Has been using the bathroom well.  Was just concerned about bruising and was unable to get in to see his primary care doctor.  Patient has extensive bruising to the upper abdomen which is likely expected given his recent trauma.  Does not have any focal tenderness on exam.  Hemoglobin appears stable at 16.7.  Creatinine at baseline.  Lab work overall reassuring.  Low concern for any intra-abdominal injury.  Especially, given lab work, vital signs, 5 days post fall.  Likely expected bruising following a fall while patient is on Plavix.  Patient was given reassurance and discharged from the ED in good condition.  Given return precautions.  This chart was dictated using voice recognition software.  Despite best efforts to proofread,  errors can occur which can change the documentation meaning.    Final Clinical Impressions(s) / ED Diagnoses   Final diagnoses:  Contusion of abdominal wall, initial encounter    ED Discharge  Orders    None       Lennice Sites, DO 03/08/19 1145

## 2019-03-17 ENCOUNTER — Encounter: Payer: Self-pay | Admitting: Family Medicine

## 2019-03-25 ENCOUNTER — Ambulatory Visit (INDEPENDENT_AMBULATORY_CARE_PROVIDER_SITE_OTHER): Payer: Managed Care, Other (non HMO) | Admitting: *Deleted

## 2019-03-25 DIAGNOSIS — I255 Ischemic cardiomyopathy: Secondary | ICD-10-CM

## 2019-03-25 DIAGNOSIS — I509 Heart failure, unspecified: Secondary | ICD-10-CM | POA: Diagnosis not present

## 2019-03-25 LAB — CUP PACEART REMOTE DEVICE CHECK
Battery Remaining Longevity: 133 mo
Battery Voltage: 3.03 V
Brady Statistic RV Percent Paced: 0.98 %
Date Time Interrogation Session: 20201111055148
HighPow Impedance: 45 Ohm
HighPow Impedance: 53 Ohm
Implantable Lead Implant Date: 20120709
Implantable Lead Location: 753860
Implantable Lead Model: 6947
Implantable Pulse Generator Implant Date: 20191129
Lead Channel Impedance Value: 285 Ohm
Lead Channel Impedance Value: 342 Ohm
Lead Channel Pacing Threshold Amplitude: 1.25 V
Lead Channel Pacing Threshold Pulse Width: 0.4 ms
Lead Channel Sensing Intrinsic Amplitude: 14.875 mV
Lead Channel Sensing Intrinsic Amplitude: 14.875 mV
Lead Channel Setting Pacing Amplitude: 2.5 V
Lead Channel Setting Pacing Pulse Width: 0.5 ms
Lead Channel Setting Sensing Sensitivity: 0.3 mV

## 2019-04-15 NOTE — Progress Notes (Signed)
Remote ICD transmission.   

## 2019-06-24 ENCOUNTER — Ambulatory Visit (INDEPENDENT_AMBULATORY_CARE_PROVIDER_SITE_OTHER): Payer: Managed Care, Other (non HMO) | Admitting: *Deleted

## 2019-06-24 DIAGNOSIS — I509 Heart failure, unspecified: Secondary | ICD-10-CM

## 2019-06-25 LAB — CUP PACEART REMOTE DEVICE CHECK
Battery Remaining Longevity: 132 mo
Battery Voltage: 3.01 V
Brady Statistic RV Percent Paced: 2.12 %
Date Time Interrogation Session: 20210211022726
HighPow Impedance: 48 Ohm
HighPow Impedance: 57 Ohm
Implantable Lead Implant Date: 20120709
Implantable Lead Location: 753860
Implantable Lead Model: 6947
Implantable Pulse Generator Implant Date: 20191129
Lead Channel Impedance Value: 304 Ohm
Lead Channel Impedance Value: 361 Ohm
Lead Channel Pacing Threshold Amplitude: 1.25 V
Lead Channel Pacing Threshold Pulse Width: 0.4 ms
Lead Channel Sensing Intrinsic Amplitude: 15.25 mV
Lead Channel Sensing Intrinsic Amplitude: 15.25 mV
Lead Channel Setting Pacing Amplitude: 2.5 V
Lead Channel Setting Pacing Pulse Width: 0.5 ms
Lead Channel Setting Sensing Sensitivity: 0.3 mV

## 2019-06-25 NOTE — Progress Notes (Signed)
ICD Remote  

## 2019-07-31 LAB — COMPREHENSIVE METABOLIC PANEL
Albumin: 4.2 (ref 3.5–5.0)
Calcium: 9.7 (ref 8.7–10.7)
GFR calc Af Amer: 69
GFR calc non Af Amer: 60
Globulin: 1.7

## 2019-07-31 LAB — LIPID PANEL
Cholesterol: 109 (ref 0–200)
HDL: 60 (ref 35–70)
LDL Cholesterol: 32
Triglycerides: 88 (ref 40–160)

## 2019-07-31 LAB — CBC AND DIFFERENTIAL
HCT: 51 (ref 41–53)
Hemoglobin: 17.2 (ref 13.5–17.5)
Neutrophils Absolute: 6
Platelets: 206 (ref 150–399)
WBC: 10.1

## 2019-07-31 LAB — CBC: RBC: 5.62 — AB (ref 3.87–5.11)

## 2019-07-31 LAB — BASIC METABOLIC PANEL
BUN: 16 (ref 4–21)
CO2: 24 — AB (ref 13–22)
Chloride: 101 (ref 99–108)
Creatinine: 1.4 — AB (ref 0.6–1.3)
Glucose: 114
Potassium: 5 (ref 3.4–5.3)
Sodium: 143 (ref 137–147)

## 2019-07-31 LAB — HEPATIC FUNCTION PANEL
ALT: 21 (ref 10–40)
AST: 17 (ref 14–40)
Alkaline Phosphatase: 116 (ref 25–125)
Bilirubin, Total: 1

## 2019-08-12 ENCOUNTER — Telehealth: Payer: Self-pay | Admitting: Internal Medicine

## 2019-08-12 ENCOUNTER — Encounter: Payer: Self-pay | Admitting: Physician Assistant

## 2019-08-12 NOTE — Telephone Encounter (Signed)
ERROR

## 2019-08-17 DIAGNOSIS — I4729 Other ventricular tachycardia: Secondary | ICD-10-CM | POA: Insufficient documentation

## 2019-08-17 DIAGNOSIS — I472 Ventricular tachycardia: Secondary | ICD-10-CM | POA: Insufficient documentation

## 2019-08-18 ENCOUNTER — Other Ambulatory Visit: Payer: Self-pay

## 2019-08-18 ENCOUNTER — Ambulatory Visit: Payer: Managed Care, Other (non HMO) | Admitting: Internal Medicine

## 2019-08-18 ENCOUNTER — Encounter: Payer: Self-pay | Admitting: Internal Medicine

## 2019-08-18 VITALS — BP 110/80 | HR 70 | Ht 67.0 in | Wt 238.0 lb

## 2019-08-18 DIAGNOSIS — I255 Ischemic cardiomyopathy: Secondary | ICD-10-CM

## 2019-08-18 DIAGNOSIS — Z95810 Presence of automatic (implantable) cardiac defibrillator: Secondary | ICD-10-CM

## 2019-08-18 DIAGNOSIS — I472 Ventricular tachycardia: Secondary | ICD-10-CM | POA: Diagnosis not present

## 2019-08-18 DIAGNOSIS — I4729 Other ventricular tachycardia: Secondary | ICD-10-CM

## 2019-08-18 NOTE — Patient Instructions (Signed)

## 2019-08-18 NOTE — Progress Notes (Signed)
Patient Care Team: [name], MD as PCP - General (Family Medicine) Josue Hector, MD as PCP - Cardiology (Cardiology) Josue Hector, MD as Consulting Physician (Cardiology) Deboraha Sprang, MD as Consulting Physician (Cardiology) Corliss Parish, MD as Consulting Physician (Nephrology) Early, Arvilla Meres, MD as Consulting Physician (Vascular Surgery) Lowella Bandy, MD as Consulting Physician (Urology) Pyrtle, Lajuan Lines, MD as Consulting Physician (Gastroenterology) Pieter Partridge, DO as Consulting Physician (Neurology) Deneise Lever, MD as Consulting Physician (Pulmonary Disease) Frederik Pear, MD as Consulting Physician (Orthopedic Surgery) Darien Ramus, MD as Consulting Physician (Nephrology)   HPI  Roy Dyer is a 54 y.o. male Seen in followup for ICD implanted for primary prevention 2012 and generator replacement 2019.   He has primarily an ischemic myopathy status post LAD occlusion and bare-metal stenti with stentng of his LAD with prior stenting of the circumflex and RCA.    DATE TEST EF   7/12 cMRI  20 %   3/13 Echo   35 %   9/18 LHC  LAD 20; OM 40 mRCA 90>>0   DES   Renal transplant doing well   The patient denies chest pain, shortness of breath, nocturnal dyspnea, orthopnea or peripheral edema.  There have been no palpitations, lightheadedness or syncope.         Past Medical History:  Diagnosis Date  . AICD (automatic cardioverter/defibrillator) present    Dr. Paschal Dopp follows remotely-yrly checks, Dr. Jonne Ply  . Avascular necrosis of bone of hip ([place]) 2010 surg   Left hip arthroplasty: from chronic systemic steroids taken for his Lupus  . Barrett's esophagus   . Blood transfusion without reported diagnosis 2010, 2012  . CAD (coronary artery disease)    a. stents RCA/Circ 2001 b. BMS to LAD 07/2010  c. 01/2017: s/p DES to RCA.   . Cataract    left eye  . CHF (congestive heart failure) ([place])   . Chronic headaches 02/2017   As of  01/2017, HA's no better, neurologist ordered CT.  ESR normal at that time.  HA'S COMPLETELY RESOLVED AFTER HE GOT ON CPAP 2019/2020.  [name] Chronic renal insufficiency, stage II (mild)    GFR 65 ml/min 07/19/15 at local renal f/u (Cr 1.29).  GFR 64 ml/min (cr 1.3) at local renal f/u 01/16/17.  . Erythrocytosis    Pre and post transplant->on losartan for this  . GERD (gastroesophageal reflux disease)    Hx of esoph stricture and dilatation.  +Barrett's esophagus+hx of aspiration pneumonia-->to get rpt EGD when he comes off DAPT (utd as of 01/29/18)  . Gout    s/p renal transplant he was weened off of his allopurinol.  . Hiatal hernia   . 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 History of renal transplant 11/10/2012   10/2012 [facility]   . Hyperlipidemia   . Hypertension   . IFG (impaired fasting glucose)   . implantable cardiac defibrillator single chamber    Medtronic (due to low EF)  . Ischemic cardiomyopathy    Chronic systolic dysfunction--  30/8657 EF 30-35%.   Single chamber ICD 11/20/10 ([name])  . Left ventricular thrombus 2012   Re-eval 02/2011 showed thrombus RESOLVED, so coumadin was d/c'd (was on it for 79mo  . [place]   . OSA on CPAP 2018   Dr. [name]9/2018: +OSA on home sleep studay; CPAP auto titrate 5-20 started  05/2017.  . Osteoporosis   . Post-transplant erythrocytosis    improved with ARB (valsartan)    Past Surgical History:  Procedure Laterality Date  . AV FISTULA PLACEMENT  03/28/2011   Procedure: ARTERIOVENOUS (AV) FISTULA CREATION;  Surgeon: Rosetta Posner, MD;  Location: [place];  Service: Vascular;  Laterality: Left;  Creation of Left radiocephallic cimino fistula  . AV FISTULA PLACEMENT  04/27/2011   Procedure: ARTERIOVENOUS (AV) FISTULA CREATION;  Surgeon: Rosetta Posner, MD;  Location: [place];  Service: Vascular;  Laterality: Left;  left basilic vein transposition  . CARDIAC CATHETERIZATION      08/2010  . CARDIOVASCULAR STRESS TEST  07/2010; 03/2014   03/2014 showed large old infarct and EF 30-35% but no ischemia  . DEXA  07/05/2017   Normal bone density in radius and spine.  . ESOPHAGOGASTRODUODENOSCOPY  02/2009   Done by Dr. Fuller Plan for hematemesis; esophagitis found.  Repeat recommended 09/2016, at which time he will also likely get his initial screening colonoscopy.  . ESOPHAGOGASTRODUODENOSCOPY (EGD) WITH PROPOFOL N/A 09/29/2015   REPEAT EGD RECOMMENDED 09/2016.  Barrett's esoph + mild chronic gastritis.  H pylori NEG. Procedure: ESOPHAGOGASTRODUODENOSCOPY (EGD) WITH PROPOFOL;  Surgeon: Jerene Bears, MD;  [facility];  Service: Gastroenterology;  Laterality: N/A;  . ICD GENERATOR CHANGEOUT N/A 04/11/2018   Procedure: ICD GENERATOR CHANGEOUT;  Surgeon: Deboraha Sprang, MD;  Location: [place] [facility];  Service: Cardiovascular;  Laterality: N/A;  . INSERT / REPLACE / REMOVE PACEMAKER     medtronic        dr [name]    [place]   icd only  . KIDNEY TRANSPLANT  10/2012   [facility] nephrologist--Dr. Blair Heys  . LEFT HEART CATH AND CORONARY ANGIOGRAPHY N/A 02/08/2017   PCA and DES to mRCA--needs DAPT for at least 6 mo.   Procedure: LEFT HEART CATH AND CORONARY ANGIOGRAPHY;  Surgeon: Sherren Mocha, MD;  Location: [place] [facility];  Service: Cardiovascular;  Laterality: N/A;  . PARTIAL HIP ARTHROPLASTY Left 2010   left  . RENAL BIOPSY    . stents     05-2010 and 2- in 2010 and 1 in 2018.  [name] TOTAL HIP ARTHROPLASTY  07/09/2011   Procedure: TOTAL HIP ARTHROPLASTY;  Surgeon: Kerin Salen, MD;  Location: [place];  Service: Orthopedics;  Laterality: Right;  . TYMPANOSTOMY TUBE PLACEMENT  54 yrs old  . US ECHOCARDIOGRAPHY  12/2010; 02/2014;08/2014   02/2014 EF still 25-30%, increased PA pressures.  2016 EF 30-35%, sept/inf hypokinesis, mod tricusp regurg    Current Outpatient Medications  Medication Sig Dispense Refill  . acetaminophen (TYLENOL) 500 MG tablet Take 1,000 mg by mouth  every 6 (six) hours as needed for headache (pain).     [name] allopurinol (ZYLOPRIM) 100 MG tablet Take 1 tablet (100 mg total) by mouth daily. 90 tablet 3  . aspirin EC 81 MG tablet Take 81 mg by mouth daily.    [name] atorvastatin (LIPITOR) 80 MG tablet Take 80 mg by mouth at bedtime.    [name] BYSTOLIC 5 MG tablet TAKE 1 TABLET BY MOUTH DAILY 90 tablet 3  . clopidogrel (PLAVIX) 75 MG tablet TAKE 1 TABLET BY MOUTH DAILY 90 tablet 3  . DEXILANT 60 MG capsule TAKE 1 CAPSULE BY MOUTH DAILY BEFORE BREAKFAST 90 capsule 0  . famotidine (PEPCID) 20 MG tablet Take 1 tablet (20 mg total) by mouth at bedtime. 90 tablet 0  . losartan (COZAAR) 25 MG tablet Take  25 mg by mouth at bedtime.     . magnesium oxide (MAG-OX) 400 MG tablet Take 400 mg by mouth daily with lunch.     . mycophenolate (CELLCEPT) 250 MG capsule Take 3 capsules (750 mg total) by mouth 2 (two) times daily. 540 capsule 1  . niacin (NIASPAN) 500 MG CR tablet TAKE 2 TABLETS BY MOUTH AT BEDTIME 180 tablet 3  . nitroGLYCERIN (NITROSTAT) 0.4 MG SL tablet Place 0.4 mg under the tongue every 5 (five) minutes as needed for chest pain (DO NOT EXCEED 3 DOSES).    . predniSONE (DELTASONE) 5 MG tablet TAKE 1 TABLET BY MOUTH DAILY 90 tablet 3  . tacrolimus (PROGRAF) 0.5 MG capsule Take 0.5 mg by mouth every 12 (twelve) hours.     . traMADol (ULTRAM) 50 MG tablet Take 50 mg by mouth every 6 (six) hours as needed. for pain  0  . zolpidem (AMBIEN) 5 MG tablet Take 5 mg by mouth at bedtime as needed for sleep.     No current facility-administered medications for this visit.    Allergies  Allergen Reactions  . Triptans Palpitations    Reaction to [place]    Review of Systems negative except from HPI and PMH  Physical Exam BP 110/80   Pulse 70   Ht 5' 7"  (1.702 m)   Wt 238 lb (108 kg)   SpO2 98%   BMI 37.28 kg/m  Well developed and well nourished in no acute distress HENT normal Neck supple with JVP-flat Clear Device pocket well healed; without  hematoma or erythema.  There is no tethering  Regular rate and rhythm, no  murmur Abd-soft with active BS No Clubbing cyanosis  edema Skin-warm and dry A & Oriented  Grossly normal sensory and motor function    Assessment and  Plan  Ischemic cardiomyopathy  Implantable defibrillator      Nonsustained ventricular tachycardia     Renal transplantation    Without symptoms of ischemia  Ongoing episodes of nonsustained ventricular tachycardia.  Somewhat polymorphic.  Not long short initiated.

## 2019-08-19 ENCOUNTER — Ambulatory Visit (HOSPITAL_COMMUNITY): Payer: Managed Care, Other (non HMO) | Attending: Cardiology

## 2019-08-19 DIAGNOSIS — I255 Ischemic cardiomyopathy: Secondary | ICD-10-CM | POA: Diagnosis not present

## 2019-08-21 ENCOUNTER — Encounter: Payer: Self-pay | Admitting: Family Medicine

## 2019-08-21 ENCOUNTER — Ambulatory Visit: Payer: Managed Care, Other (non HMO) | Admitting: Family Medicine

## 2019-08-21 ENCOUNTER — Other Ambulatory Visit: Payer: Self-pay

## 2019-08-21 VITALS — BP 118/73 | HR 72 | Temp 97.6°F | Resp 16 | Ht 67.0 in | Wt 236.4 lb

## 2019-08-21 DIAGNOSIS — Z940 Kidney transplant status: Secondary | ICD-10-CM | POA: Diagnosis not present

## 2019-08-21 DIAGNOSIS — K219 Gastro-esophageal reflux disease without esophagitis: Secondary | ICD-10-CM

## 2019-08-21 DIAGNOSIS — I5042 Chronic combined systolic (congestive) and diastolic (congestive) heart failure: Secondary | ICD-10-CM | POA: Diagnosis not present

## 2019-08-21 DIAGNOSIS — N183 Chronic kidney disease, stage 3 unspecified: Secondary | ICD-10-CM | POA: Diagnosis not present

## 2019-08-21 NOTE — Progress Notes (Signed)
OFFICE VISIT  08/21/2019   CC:  Chief Complaint  Patient presents with  . Follow-up    RCI, pt is not fasting   HPI:    Patient is a 54 y.o. Caucasian male who presents for follow up chronic medical problems.   A/P as of last visit: "Health maintenance exam: Reviewed age and gender appropriate health maintenance issues (prudent diet, regular exercise, health risks of tobacco and excessive alcohol, use of seatbelts, fire alarms in home, use of sunscreen).  Also reviewed age and gender appropriate health screening as well as vaccine recommendations. Vaccines: all UTD. Labs: FLP, PSA.  Pt has gotten CMET, A1c, CBC, and TSH through his [facility] transplant clinic MDs at recent f/u there. Will request these labs. Prostate ca screening: DRE normal , PSA today. Colon ca screening: due for initial screening colonoscopy-->he just has to call GI b/c he is already established."  Interim hx: He got labs via nephrology 07/31/19 and brought them in today. Reviewed them: CBC normal. CMET great (sCr 1.35), UA normal, no proteinuria. Uric acid, mag, phos all normal. BK and CMV neg.  Lipid panel all great.  Glucose 114, unsure if fasting or not. Tacrolimus level 5.6.  Working from home.  Right knee hurting all the time, wt is up, hard to exercise. Not pushing himself enough to even tell if he has DOE.  Breathing at rest and with casual ambulation is fine.  He is seeing his GI MD next week; due for f/u EGD/Barrett's and get screening colonoscopy. Also f/u with Dr. Johnsie Cancel 10/2019.  Insomnia: says he has not used [place] in 3-4 yrs. Pain: has not used tramadol in 9 months (knee or hip pain).  No new questions.  ROS: no fevers, no CP, no SOB, no wheezing, no cough, no dizziness, no HAs, no rashes, no melena/hematochezia.  No polyuria or polydipsia.  No myalgias or arthralgias.  No focal weakness, paresthesias, or tremors.  No acute vision or hearing abnormalities. No n/v/d or abd pain.  No palpitations.     Past Medical History:  Diagnosis Date  . AICD (automatic cardioverter/defibrillator) present    Dr. Paschal Dopp follows remotely-yrly checks, Dr. Jonne Ply  . Avascular necrosis of bone of hip ([place]) 2010 surg   Left hip arthroplasty: from chronic systemic steroids taken for his Lupus  . Barrett's esophagus   . Blood transfusion without reported diagnosis 2010, 2012  . CAD (coronary artery disease)    a. stents RCA/Circ 2001 b. BMS to LAD 07/2010  c. 01/2017: s/p DES to RCA.   . Cataract    left eye  . CHF (congestive heart failure) ([place])   . Chronic headaches 02/2017   As of 01/2017, HA's no better, neurologist ordered CT.  ESR normal at that time.  HA'S COMPLETELY RESOLVED AFTER HE GOT ON CPAP 2019/2020.  [name] Chronic renal insufficiency, stage II (mild)    GFR 65 ml/min 07/19/15 at local renal f/u (Cr 1.29).  GFR 64 ml/min (cr 1.3) at local renal f/u 01/16/17.  . Erythrocytosis    Pre and post transplant->on losartan for this  . GERD (gastroesophageal reflux disease)    Hx of esoph stricture and dilatation.  +Barrett's esophagus+hx of aspiration pneumonia-->to get rpt EGD when he comes off DAPT (utd as of 01/29/18)  . Gout    s/p renal transplant he was weened off of his allopurinol.  . Hiatal hernia   . History of end stage renal disease 04/24/2011   Secondary to SLE: HD 06/2011-10/2012 (then  got renal transplant)  . History of renal transplant 11/10/2012   10/2012 [facility]   . Hyperlipidemia   . Hypertension   . IFG (impaired fasting glucose)   . implantable cardiac defibrillator single chamber    Medtronic (due to low EF)  . Ischemic cardiomyopathy    Chronic systolic dysfunction--  71/2197 EF 30-35%.   Single chamber ICD 11/20/10 ([name])  . Left ventricular thrombus 2012   Re-eval 02/2011 showed thrombus RESOLVED, so coumadin was d/c'd (was on it for 36mo  . [place]   . OSA on CPAP 2018   Dr. [name]9/2018: +OSA on home sleep studay; CPAP auto titrate 5-20 started 05/2017.  .  Osteoporosis   . Post-transplant erythrocytosis    improved with ARB (valsartan)    Past Surgical History:  Procedure Laterality Date  . AV FISTULA PLACEMENT  03/28/2011   Procedure: ARTERIOVENOUS (AV) FISTULA CREATION;  Surgeon: TRosetta Posner MD;  Location[facility]  Service: Vascular;  Laterality: Left;  Creation of Left radiocephallic cimino fistula  . AV FISTULA PLACEMENT  04/27/2011   Procedure: ARTERIOVENOUS (AV) FISTULA CREATION;  Surgeon: TRosetta Posner MD;  [place] [facility] [place]  Service: Vascular;  Laterality: Left;  left basilic vein transposition  . CARDIAC CATHETERIZATION     08/2010  . CARDIOVASCULAR STRESS TEST  07/2010; 03/2014   03/2014 showed large old infarct and EF 30-35% but no ischemia  . DEXA  07/05/2017   Normal bone density in radius and spine.  . ESOPHAGOGASTRODUODENOSCOPY  02/2009   Done by Dr. SFuller Planfor hematemesis; esophagitis found.  Repeat recommended 09/2016, at which time he will also likely get his initial screening colonoscopy.  . ESOPHAGOGASTRODUODENOSCOPY (EGD) WITH PROPOFOL N/A 09/29/2015   REPEAT EGD RECOMMENDED 09/2016.  Barrett's esoph + mild chronic gastritis.  H pylori NEG. Procedure: ESOPHAGOGASTRODUODENOSCOPY (EGD) WITH PROPOFOL;  Surgeon: JJerene Bears MD;  Location[facility];  Service: Gastroenterology;  Laterality: N/A;  . ICD GENERATOR CHANGEOUT N/A 04/11/2018   Procedure: ICD GENERATOR CHANGEOUT;  Surgeon: KDeboraha Sprang MD;  [facility];  Service: Cardiovascular;  Laterality: N/A;  . INSERT / REPLACE / REMOVE PACEMAKER     medtronic        dr nFrances Nickels   [place]   icd only  . KIDNEY TRANSPLANT  10/2012   [facility] nephrologist--Dr. MBlair Heys . LEFT HEART CATH AND CORONARY ANGIOGRAPHY N/A 02/08/2017   PCA and DES to mRCA--needs DAPT for at least 6 mo.   Procedure: LEFT HEART CATH AND CORONARY ANGIOGRAPHY;  Surgeon: CSherren Mocha MD;  [facility];  Service: Cardiovascular;  Laterality: N/A;  . PARTIAL HIP  ARTHROPLASTY Left 2010   left  . RENAL BIOPSY    . stents     05-2010 and 2- in 2010 and 1 in 2018.  .Marland KitchenTOTAL HIP ARTHROPLASTY  07/09/2011   Procedure: TOTAL HIP ARTHROPLASTY;  Surgeon: FKerin Salen MD;  [facility] [facility]  Service: Orthopedics;  Laterality: Right;  . TYMPANOSTOMY TUBE PLACEMENT  54yrs old  . [place]  12/2010; 02/2014;08/2014;08/2019   02/2014 EF still 25-30%, increased PA pressures.  2016 EF 30-35%, sept/inf hypokinesis, mod tricusp regurg. 08/2019 EF 25-30%, global hypok, sev dil LV w/apical aneur, grd III DD, mod tricus reg, mod pulm htn    Outpatient Medications Prior to Visit  Medication Sig Dispense Refill  . allopurinol (ZYLOPRIM) 100 MG tablet Take 1 tablet (100 mg total) by  mouth daily. 90 tablet 3  . aspirin EC 81 MG tablet Take 81 mg by mouth daily.    [name] atorvastatin (LIPITOR) 80 MG tablet Take 80 mg by mouth at bedtime.    [name] BYSTOLIC 5 MG tablet TAKE 1 TABLET BY MOUTH DAILY 90 tablet 3  . clopidogrel (PLAVIX) 75 MG tablet TAKE 1 TABLET BY MOUTH DAILY 90 tablet 3  . DEXILANT 60 MG capsule TAKE 1 CAPSULE BY MOUTH DAILY BEFORE BREAKFAST 90 capsule 0  . famotidine (PEPCID) 20 MG tablet Take 1 tablet (20 mg total) by mouth at bedtime. 90 tablet 0  . losartan (COZAAR) 25 MG tablet Take 25 mg by mouth at bedtime.     . magnesium oxide (MAG-OX) 400 MG tablet Take 400 mg by mouth daily with lunch.     . mycophenolate (CELLCEPT) 250 MG capsule Take 3 capsules (750 mg total) by mouth 2 (two) times daily. 540 capsule 1  . niacin (NIASPAN) 500 MG CR tablet TAKE 2 TABLETS BY MOUTH AT BEDTIME 180 tablet 3  . predniSONE (DELTASONE) 5 MG tablet TAKE 1 TABLET BY MOUTH DAILY 90 tablet 3  . tacrolimus (PROGRAF) 0.5 MG capsule Take 0.5 mg by mouth every 12 (twelve) hours.     [name] acetaminophen (TYLENOL) 500 MG tablet Take 1,000 mg by mouth every 6 (six) hours as needed for headache (pain).     . nitroGLYCERIN (NITROSTAT) 0.4 MG SL tablet Place 0.4 mg under the tongue every 5  (five) minutes as needed for chest pain (DO NOT EXCEED 3 DOSES).    . traMADol (ULTRAM) 50 MG tablet Take 50 mg by mouth every 6 (six) hours as needed. for pain  0  . zolpidem (AMBIEN) 5 MG tablet Take 5 mg by mouth at bedtime as needed for sleep.     No facility-administered medications prior to visit.    Allergies  Allergen Reactions  . Triptans Palpitations    Reaction to [place]    ROS As per HPI  PE: Blood pressure 118/73, pulse 72, temperature 97.6 F (36.4 C), temperature source Temporal, resp. rate 16, height 5' 7"  (1.702 m), weight 236 lb 6.4 oz (107.2 kg), SpO2 96 %. Body mass index is 37.03 kg/m.  Gen: Alert, well appearing.  Patient is oriented to person, place, time, and situation. AFFECT: pleasant, lucid thought and speech. CV: Fairly regular but has brief pause every 5th beat or so, soft systolic murmur w/out any diastolic murmur audible.  No r/g.  S1 and S2 are distant. LUNGS: CTA bilat, nonlabored resps, good aeration in all lung fields. EXT: no clubbing or cyanosis.  no edema.    LABS:  Lab Results  Component Value Date   TSH 0.52 09/03/2013   Lab Results  Component Value Date   WBC 10.1 07/31/2019   HGB 17.2 07/31/2019   HCT 51 07/31/2019   MCV 93.0 03/08/2019   PLT 206 07/31/2019   Lab Results  Component Value Date   CREATININE 1.4 (A) 07/31/2019   BUN 16 07/31/2019   NA 143 07/31/2019   K 5.0 07/31/2019   CL 101 07/31/2019   CO2 24 (A) 07/31/2019   Lab Results  Component Value Date   ALT 21 07/31/2019   AST 17 07/31/2019   ALKPHOS 116 07/31/2019   BILITOT 1.6 (H) 03/08/2019   Lab Results  Component Value Date   CHOL 109 07/31/2019   Lab Results  Component Value Date   HDL 60 07/31/2019   Lab Results  Component Value Date   LDLCALC 32 07/31/2019   Lab Results  Component Value Date   TRIG 88 07/31/2019   Lab Results  Component Value Date   CHOLHDL 2 07/15/2018   Lab Results  Component Value Date   PSA 1.70 07/15/2018    PSA 1.71 10/23/2010   Lab Results  Component Value Date   HGBA1C 5.6 04/15/2018    IMPRESSION AND PLAN:  1) CAD/CHF; most recent echo shows overall function a bit worse than 2016 echo.  Pt w/out any sx's but admittedly doesn't try any activity greater than casual walking. He is on med mgmt + has ICD and is followed by electrophys and Dr. Johnsie Cancel as well. Recent lytes good.  2) CRI with transplanted kidney: most recent labs at nephrol reviewed today, all stable w/GFR around 60.  3) GERD w/Barrett's esoph as well as need for initial screening colonoscopy: has f/u with his GI MD soon, likely will get upper and lower endoscopy soon after. Continue dexilant qAM and pepcid qpm.  4) Chronic R knee pain, hx of hip replacement. He doesn't use tramadol much at all. He says he will be following up with his orthopedist about his R knee soon b/c it bothers him a lot and limits exercise and wt loss.  5) IFG: glucose 114 on most recent labs last month. Will do A1c on labs at CPE in 3 mo.  An After Visit Summary was printed and given to the patient.  FOLLOW UP: Return in about 3 months (around 11/20/2019) for annual CPE (fasting).  Signed:  Crissie Sickles, MD           08/21/2019

## 2019-08-25 ENCOUNTER — Telehealth: Payer: Self-pay

## 2019-08-25 NOTE — Telephone Encounter (Signed)
**Note De-Identified  Obfuscation** Per [facility] request from the pt I started a [employer] PA through covermymeds: Key: [id number]  I received and forwarded the following message to the pt:  [employer] is reviewing your PA request and will respond within 24 hours for Medicaid or up to 72 hours for non-Medicaid plans, based on the required timeframe determined by state or federal regulations. To check for an update later, open this request from your dashboard.  I also advised that I will contact the pt with determination once received.

## 2019-08-27 ENCOUNTER — Encounter: Payer: Self-pay | Admitting: Physician Assistant

## 2019-08-27 ENCOUNTER — Telehealth: Payer: Self-pay | Admitting: General Surgery

## 2019-08-27 ENCOUNTER — Other Ambulatory Visit: Payer: Self-pay

## 2019-08-27 ENCOUNTER — Ambulatory Visit: Payer: Managed Care, Other (non HMO) | Admitting: Physician Assistant

## 2019-08-27 VITALS — BP 136/78 | HR 88 | Temp 97.8°F | Ht 67.0 in | Wt 236.4 lb

## 2019-08-27 DIAGNOSIS — K2270 Barrett's esophagus without dysplasia: Secondary | ICD-10-CM

## 2019-08-27 DIAGNOSIS — Z7901 Long term (current) use of anticoagulants: Secondary | ICD-10-CM

## 2019-08-27 DIAGNOSIS — K219 Gastro-esophageal reflux disease without esophagitis: Secondary | ICD-10-CM | POA: Diagnosis not present

## 2019-08-27 DIAGNOSIS — Z1212 Encounter for screening for malignant neoplasm of rectum: Secondary | ICD-10-CM

## 2019-08-27 DIAGNOSIS — Z1211 Encounter for screening for malignant neoplasm of colon: Secondary | ICD-10-CM

## 2019-08-27 MED ORDER — NA SULFATE-K SULFATE-MG SULF 17.5-3.13-1.6 GM/177ML PO SOLN: 1.0000 | Freq: Once | ORAL | 0 refills | Status: AC

## 2019-08-27 NOTE — Progress Notes (Signed)
Chief Complaint: History of Barrett's esophagus and screening for colorectal cancer  HPI:    Mr. Roy Dyer is a 54 year old male with a past medical history as listed below including status post AICD on Plavix (08/19/2019 LVEF decreased at 25-30%), GERD with reflux esophagitis, Barrett's esophagus, lupus nephropathy status post renal transplant 2014, hypertension and gout, known to Dr. Elmo Putt, who presents to clinic today to discuss his history of Barrett's esophagus and a screening colonoscopy. 09/29/2015 patient had an EGD which revealed grade C reflux esophagitis with Barrett's change.  3 cm hiatal hernia.  Mild gastritis but a normal examined duodenum.  Esophageal biopsy showed intestinal metaplasia without dysplasia.  Gastric biopsy showed mild chronic gastritis and hyperemia without H. pylori or metaplasia.  He was placed on Dexilant 60 mg daily and ranitidine 150 mg nightly.    12/12/2015 patient was seen in clinic for follow-up.  At that time he was continued on Dexilant 60 mg every morning and ranitidine 150 mg nightly.  Repeat endoscopy recommended in May 2018 to ensure healing of the esophagitis and repeat biopsies for Barrett's.  Was also recommended he have colorectal cancer screening at that same time.    Today, the patient tells me that about the time he is due for repeat EGD he had a heart attack.  Then Covid hit.  He just now had his second Covid vaccine and is willing to go through with procedures and would like to have them done.  Tells me that he continues with some reflux issues regardless of his Dexilant 60 mg every morning and Pepcid 20 mg at night.  Tells me he will wake up at least 2-3 times a month choking.  He does have the head of his bed elevated and "all the things", but sometimes he eats too late at night and this is typically when his problems occur.  Tells me he is aware that he is unable to have a Nissen or at least that is what he was told due to his decreased motility from  lupus.    Denies fever, chills, blood in his stool, weight loss, change in bowel habits or abdominal pain.  Past Medical History:  Diagnosis Date  . AICD (automatic cardioverter/defibrillator) present    Dr. Paschal Dopp follows remotely-yrly checks, Dr. Jonne Ply  . Avascular necrosis of bone of hip ([place]) 2010 surg   Left hip arthroplasty: from chronic systemic steroids taken for his Lupus  . Barrett's esophagus   . Blood transfusion without reported diagnosis 2010, 2012  . CAD (coronary artery disease)    a. stents RCA/Circ 2001 b. BMS to LAD 07/2010  c. 01/2017: s/p DES to RCA.   . Cataract    left eye  . CHF (congestive heart failure) ([place])   . Chronic headaches 02/2017   As of 01/2017, HA's no better, neurologist ordered CT.  ESR normal at that time.  HA'S COMPLETELY RESOLVED AFTER HE GOT ON CPAP 2019/2020.  [name] Chronic renal insufficiency, stage II (mild)    GFR 65 ml/min 07/19/15 at local renal f/u (Cr 1.29).  GFR 64 ml/min (cr 1.3) at local renal f/u 01/16/17.  . Erythrocytosis    Pre and post transplant->on losartan for this  . GERD (gastroesophageal reflux disease)    Hx of esoph stricture and dilatation.  +Barrett's esophagus+hx of aspiration pneumonia-->to get rpt EGD when he comes off DAPT (utd as of 01/29/18)  . Gout    s/p renal transplant he was weened off of his allopurinol.  [name]  Hiatal hernia   . 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 History of renal transplant 11/10/2012   10/2012 [facility]   . Hyperlipidemia   . Hypertension   . IFG (impaired fasting glucose)   . implantable cardiac defibrillator single chamber    Medtronic (due to low EF)  . Ischemic cardiomyopathy    Chronic systolic dysfunction--  16/1096 EF 30-35%.   Single chamber ICD 11/20/10 ([name])  . Left ventricular thrombus 2012   Re-eval 02/2011 showed thrombus RESOLVED, so coumadin was d/c'd (was on it for 25mo  . Lupus ([place]   . OSA  on CPAP 2018   Dr. [name]9/2018: +OSA on home sleep studay; CPAP auto titrate 5-20 started 05/2017.  . Osteoporosis   . Post-transplant erythrocytosis    improved with ARB (valsartan)    Past Surgical History:  Procedure Laterality Date  . AV FISTULA PLACEMENT  03/28/2011   Procedure: ARTERIOVENOUS (AV) FISTULA CREATION;  Surgeon: TRosetta Posner MD;  [facility]  Service: Vascular;  Laterality: Left;  Creation of Left radiocephallic cimino fistula  . AV FISTULA PLACEMENT  04/27/2011   Procedure: ARTERIOVENOUS (AV) FISTULA CREATION;  Surgeon: TRosetta Posner MD;  [place]  Service: Vascular;  Laterality: Left;  left basilic vein transposition  . CARDIAC CATHETERIZATION     08/2010  . CARDIOVASCULAR STRESS TEST  07/2010; 03/2014   03/2014 showed large old infarct and EF 30-35% but no ischemia  . DEXA  07/05/2017   Normal bone density in radius and spine.  . ESOPHAGOGASTRODUODENOSCOPY  02/2009   Done by Dr. SFuller Planfor hematemesis; esophagitis found.  Repeat recommended 09/2016, at which time he will also likely get his initial screening colonoscopy.  . ESOPHAGOGASTRODUODENOSCOPY (EGD) WITH PROPOFOL N/A 09/29/2015   REPEAT EGD RECOMMENDED 09/2016.  Barrett's esoph + mild chronic gastritis.  H pylori NEG. Procedure: ESOPHAGOGASTRODUODENOSCOPY (EGD) WITH PROPOFOL;  Surgeon: JJerene Bears MD;  [facility];  Service: Gastroenterology;  Laterality: N/A;  . ICD GENERATOR CHANGEOUT N/A 04/11/2018   Procedure: ICD GENERATOR CHANGEOUT;  Surgeon: KDeboraha Sprang MD;  [place] [facility];  Service: Cardiovascular;  Laterality: N/A;  . INSERT / REPLACE / REMOVE PACEMAKER     medtronic        dr nFrances Nickels   [place]   icd only  . KIDNEY TRANSPLANT  10/2012   [facility] nephrologist--Dr. MBlair Heys . LEFT HEART CATH AND CORONARY ANGIOGRAPHY N/A 02/08/2017   PCA and DES to mRCA--needs DAPT for at least 6 mo.   Procedure: LEFT HEART CATH AND CORONARY ANGIOGRAPHY;  Surgeon: CSherren Mocha MD;  [facility];  Service: Cardiovascular;  Laterality: N/A;  . PARTIAL HIP ARTHROPLASTY Left 2010   left  . RENAL BIOPSY    . stents     05-2010 and 2- in 2010 and 1 in 2018.  .Marland KitchenTOTAL HIP ARTHROPLASTY  07/09/2011   Procedure: TOTAL HIP ARTHROPLASTY;  Surgeon: FKerin Salen MD;  [place] [facility]  Service: Orthopedics;  Laterality: Right;  . TYMPANOSTOMY TUBE PLACEMENT  54yrs old  . [place]  12/2010; 02/2014;08/2014;08/2019   02/2014 EF still 25-30%, increased PA pressures.  2016 EF 30-35%, sept/inf hypokinesis, mod tricusp regurg. 08/2019 EF 25-30%, global hypok, sev dil LV w/apical aneur, grd III DD, mod tricus reg, mod pulm htn    Current Outpatient Medications  Medication Sig Dispense Refill  . acetaminophen (TYLENOL) 500 MG tablet Take 1,000 mg by mouth every 6 (six) hours as needed for headache (pain).     [name] allopurinol (ZYLOPRIM) 100 MG tablet Take 1 tablet (100 mg total) by mouth daily. 90 tablet 3  . aspirin EC 81 MG tablet Take 81 mg by mouth daily.    [name] atorvastatin (LIPITOR) 80 MG tablet Take 80 mg by mouth at bedtime.    [name] BYSTOLIC 5 MG tablet TAKE 1 TABLET BY MOUTH DAILY 90 tablet 3  . clopidogrel (PLAVIX) 75 MG tablet TAKE 1 TABLET BY MOUTH DAILY 90 tablet 3  . DEXILANT 60 MG capsule TAKE 1 CAPSULE BY MOUTH DAILY BEFORE BREAKFAST 90 capsule 0  . famotidine (PEPCID) 20 MG tablet Take 1 tablet (20 mg total) by mouth at bedtime. 90 tablet 0  . fluticasone (FLONASE) 50 MCG/ACT nasal spray Place into both nostrils daily.    [name] losartan (COZAAR) 25 MG tablet Take 25 mg by mouth at bedtime.     . magnesium oxide (MAG-OX) 400 MG tablet Take 400 mg by mouth daily with lunch.     . mycophenolate (CELLCEPT) 250 MG capsule Take 3 capsules (750 mg total) by mouth 2 (two) times daily. 540 capsule 1  . niacin (NIASPAN) 500 MG CR tablet TAKE 2 TABLETS BY MOUTH AT BEDTIME 180 tablet 3  . nitroGLYCERIN (NITROSTAT) 0.4 MG SL tablet Place 0.4 mg under the tongue  every 5 (five) minutes as needed for chest pain (DO NOT EXCEED 3 DOSES).    . predniSONE (DELTASONE) 5 MG tablet TAKE 1 TABLET BY MOUTH DAILY 90 tablet 3  . tacrolimus (PROGRAF) 0.5 MG capsule Take 0.5 mg by mouth every 12 (twelve) hours.     . traMADol (ULTRAM) 50 MG tablet Take 50 mg by mouth every 6 (six) hours as needed. for pain  0  . zolpidem (AMBIEN) 5 MG tablet Take 5 mg by mouth at bedtime as needed for sleep.     No current facility-administered medications for this visit.    Allergies as of 08/27/2019 - Review Complete 08/27/2019  Allergen Reaction Noted  . Triptans Palpitations 06/29/2016    Family History  Problem Relation Age of Onset  . Kidney failure Mother   . Lupus Mother   . Stroke Mother   . Diabetes Mother        type 2  . Heart attack Father        X 7  . Hypertension Father   . Hyperlipidemia Father   . Heart attack Sister 11       X 1  . Hyperlipidemia Sister   . Hypertension Sister   . Heart disease Sister   . Heart attack Paternal Grandfather   . Heart disease Paternal Aunt   . Heart disease Paternal Uncle   . Colon cancer Neg Hx   . Esophageal cancer Neg Hx   . Stomach cancer Neg Hx   . Rectal cancer Neg Hx     Social History   Socioeconomic History  . Marital status: Married    Spouse name: Not on file  . Number of children: Not on file  . Years of education: Not on file  . Highest education level: Not on file  Occupational History  . Occupation: CAD Drafter     Employer: [employer]  Tobacco Use  . Smoking status: Former Smoker    Packs/day: 0.50    Years: 30.00    Pack  years: 15.00    Types: Cigarettes    Quit date: 08/02/2010    Years since quitting: 9.0  . Smokeless tobacco: Never Used  Substance and Sexual Activity  . Alcohol use: No    Alcohol/week: 0.0 standard drinks  . Drug use: No  . Sexual activity: Yes    Partners: Female  Other Topics Concern  . Not on file  Social History Narrative   Married, 1 teenage son and 1  teenage daughter.    Occupation: Printmaker.   15 pack-yr smoking hx, quit 07/2010.   Drug Use - no   No alcohol.            Social Determinants of Health   Financial Resource Strain:   . Difficulty of Paying Living Expenses:   Food Insecurity:   . Worried About Charity fundraiser in the Last Year:   . Arboriculturist in the Last Year:   Transportation Needs:   . Film/video editor (Medical):   [name] Lack of Transportation (Non-Medical):   Physical Activity:   . Days of Exercise per Week:   . Minutes of Exercise per Session:   Stress:   . Feeling of Stress :   Social Connections:   . Frequency of Communication with Friends and Family:   . Frequency of Social Gatherings with Friends and Family:   . Attends Religious Services:   . Active Member of Clubs or Organizations:   . Attends Archivist Meetings:   [name] Marital Status:   Intimate Partner Violence:   . Fear of Current or Ex-Partner:   . Emotionally Abused:   [name] Physically Abused:   . Sexually Abused:     Review of Systems:    Constitutional: No weight loss, fever or chills Skin: No rash Cardiovascular: No chest pain Respiratory: No SOB  Gastrointestinal: See HPI and otherwise negative Genitourinary: No dysuria Neurological: No headache Musculoskeletal: No new muscle or joint pain Hematologic: No bleeding  Psychiatric: No history of depression or anxiety   Physical Exam:  Vital signs: BP 136/78   Pulse 88   Temp 97.8 F (36.6 C)   Ht _0  (1.702 m)   Wt 236 lb 6 oz (107.2 kg)   BMI 37.02 kg/m   Constitutional:   Pleasant Caucasian male appears to be in NAD, Well developed, Well nourished, alert and cooperative Head:  Normocephalic and atraumatic. Eyes:   PEERL, EOMI. No icterus. Conjunctiva pink. Ears:  Normal auditory acuity. Neck:  Supple Throat: Oral cavity and pharynx without inflammation, swelling or lesion.  Respiratory: Respirations even and unlabored. Lungs clear to  auscultation bilaterally.   No wheezes, crackles, or rhonchi.  Cardiovascular: Normal S1, S2. No MRG. Regular rate and rhythm. No peripheral edema, cyanosis or pallor.  Gastrointestinal:  Soft, nondistended, nontender. No rebound or guarding. Normal bowel sounds. No appreciable masses or hepatomegaly. Rectal:  Not performed.  Msk:  Symmetrical without gross deformities. Without edema, no deformity or joint abnormality.  Neurologic:  Alert and  oriented x4;  grossly normal neurologically.  Skin:   Dry and intact without significant lesions or rashes. Psychiatric:Demonstrates good judgement and reason without abnormal affect or behaviors.  RELEVANT LABS AND IMAGING: CBC    Component Value Date/Time   WBC 10.1 07/31/2019 0000   WBC 11.9 (H) 03/08/2019 0932   RBC 5.62 (A) 07/31/2019 0000   HGB 17.2 07/31/2019 0000   HGB 16.2 03/28/2018 0815   HCT 51 07/31/2019 0000  HCT 48.0 03/28/2018 0815   PLT 206 07/31/2019 0000   PLT 193 03/28/2018 0815   MCV 93.0 03/08/2019 0932   MCV 90 03/28/2018 0815   MCH 30.9 03/08/2019 0932   MCHC 33.3 03/08/2019 0932   RDW 13.7 03/08/2019 0932   RDW 12.9 03/28/2018 0815   LYMPHSABS 1.8 02/27/2017 0822   MONOABS 0.7 02/27/2017 0822   EOSABS 0.4 02/27/2017 0822   BASOSABS 0.1 02/27/2017 0822    CMP     Component Value Date/Time   NA 143 07/31/2019 0000   K 5.0 07/31/2019 0000   CL 101 07/31/2019 0000   CO2 24 (A) 07/31/2019 0000   GLUCOSE 150 (H) 03/08/2019 0932   BUN 16 07/31/2019 0000   CREATININE 1.4 (A) 07/31/2019 0000   CREATININE 1.37 (H) 03/08/2019 0932   CALCIUM 9.7 07/31/2019 0000   CALCIUM 9.1 04/23/2011 0820   PROT 6.6 03/08/2019 0932   ALBUMIN 4.2 07/31/2019 0000   AST 17 07/31/2019 0000   ALT 21 07/31/2019 0000   ALKPHOS 116 07/31/2019 0000   BILITOT 1.6 (H) 03/08/2019 0932   GFRNONAA 60 07/31/2019 0000   GFRAA 69 07/31/2019 0000    Assessment: 1.  History of Barrett's esophagus: On last EGD in 2017, recommendations for  repeat in a year 2.  Screening for colorectal cancer: Patient is 24 and never had a screening colonoscopy 3.  GERD: Continues with some nighttime awakenings regardless of Dexilant 60 mg daily and Pepcid 20 mg nightly; likely related to decreased motility/lupus and diet/lifestyle  Plan: 1.  Scheduled patient for surveillance EGD and screening colonoscopy at the hospital due to his decreased ejection fraction.  These were scheduled with Dr. Hilarie Fredrickson in May as this is his next opening.  Did discuss risks, benefits, limitations and alternatives and the patient agrees to proceed. 2.  Patient was advised to hold his Plavix for 5 days prior to time of procedures.  We will contact his prescribing physician to ensure that holding his Plavix is acceptable for him. 3.  Increase Pepcid to 40 mg nightly to see if this helps with his nighttime awakenings.  Patient preferred me not to prescribe this and will just take 2 over-the-counter tabs. 4.  Patient is fighting with his insurance to get [place] approved. 5.  Patient to follow in clinic per recommendations from Dr. Hilarie Fredrickson after time of procedures.  [name], PA-C [facility] 08/27/2019, 8:25 AM  Cc: McGowen, [name], MD

## 2019-08-27 NOTE — Telephone Encounter (Signed)
Notified the patient Dr Johnsie Cancel stated to stop his plavix 5 days prior to his procedure. The patient verbalized understanding

## 2019-08-27 NOTE — Patient Instructions (Signed)
If you are age 53 or older, your body mass index should be between 23-30. Your Body mass index is 37.02 kg/m. If this is out of the aforementioned range listed, please consider follow up with your Primary Care Provider.  If you are age 31 or younger, your body mass index should be between 19-25. Your Body mass index is 37.02 kg/m. If this is out of the aformentioned range listed, please consider follow up with your Primary Care Provider.   Continue taking your pepcid but increase to 40mg  Contimue with Dexilant 60mg   You will be contaced by our office prior to your procedure for directions on holding your Plavix.  If you do not hear from our office 1 week prior to your scheduled procedure, please call [phone] to discuss.   Due to recent changes in healthcare laws, you may see the results of your imaging and laboratory studies on MyChart before your provider has had a chance to review them.  We understand that in some cases there may be results that are confusing or concerning to you. Not all laboratory results come back in the same time frame and the provider may be waiting for multiple results in order to interpret others.  Please give [place] 48 hours in order for your provider to thoroughly review all the results before contacting the office for clarification of your results.

## 2019-08-27 NOTE — Telephone Encounter (Signed)
Notified the patient to stop his plavix 5 days prior to his procedure

## 2019-08-27 NOTE — Telephone Encounter (Signed)
Ok to hold plavix for  days for colonscopy

## 2019-08-27 NOTE — H&P (View-Only) (Signed)
 Chief Complaint: History of Barrett's esophagus and screening for colorectal cancer  HPI:    Roy Dyer is a 53-year-old male with a past medical history as listed below including status post AICD on Plavix (08/19/2019 LVEF decreased at 25-30%), GERD with reflux esophagitis, Barrett's esophagus, lupus nephropathy status post renal transplant 2014, hypertension and gout, known to Dr. Pirtle, who presents to clinic today to discuss his history of Barrett's esophagus and a screening colonoscopy. 09/29/2015 patient had an EGD which revealed grade C reflux esophagitis with Barrett's change.  3 cm hiatal hernia.  Mild gastritis but a normal examined duodenum.  Esophageal biopsy showed intestinal metaplasia without dysplasia.  Gastric biopsy showed mild chronic gastritis and hyperemia without H. pylori or metaplasia.  He was placed on Dexilant 60 mg daily and ranitidine 150 mg nightly.    12/12/2015 patient was seen in clinic for follow-up.  At that time he was continued on Dexilant 60 mg every morning and ranitidine 150 mg nightly.  Repeat endoscopy recommended in May 2018 to ensure healing of the esophagitis and repeat biopsies for Barrett's.  Was also recommended he have colorectal cancer screening at that same time.    Today, the patient tells me that about the time he is due for repeat EGD he had a heart attack.  Then Covid hit.  He just now had his second Covid vaccine and is willing to go through with procedures and would like to have them done.  Tells me that he continues with some reflux issues regardless of his Dexilant 60 mg every morning and Pepcid 20 mg at night.  Tells me he will wake up at least 2-3 times a month choking.  He does have the head of his bed elevated and "all the things", but sometimes he eats too late at night and this is typically when his problems occur.  Tells me he is aware that he is unable to have a Nissen or at least that is what he was told due to his decreased motility from  lupus.    Denies fever, chills, blood in his stool, weight loss, change in bowel habits or abdominal pain.  Past Medical History:  Diagnosis Date  . AICD (automatic cardioverter/defibrillator) present    Dr. Kleinn follows remotely-yrly checks, Dr. Nishan,cardiology  . Avascular necrosis of bone of hip (HCC) 2010 surg   Left hip arthroplasty: from chronic systemic steroids taken for his Lupus  . Barrett's esophagus   . Blood transfusion without reported diagnosis 2010, 2012  . CAD (coronary artery disease)    a. stents RCA/Circ 2001 b. BMS to LAD 07/2010  c. 01/2017: s/p DES to RCA.   . Cataract    left eye  . CHF (congestive heart failure) (HCC)   . Chronic headaches 02/2017   As of 01/2017, HA's no better, neurologist ordered CT.  ESR normal at that time.  HA'S COMPLETELY RESOLVED AFTER HE GOT ON CPAP 2019/2020.  . Chronic renal insufficiency, stage II (mild)    GFR 65 ml/min 07/19/15 at local renal f/u (Cr 1.29).  GFR 64 ml/min (cr 1.3) at local renal f/u 01/16/17.  . Erythrocytosis    Pre and post transplant->on losartan for this  . GERD (gastroesophageal reflux disease)    Hx of esoph stricture and dilatation.  +Barrett's esophagus+hx of aspiration pneumonia-->to get rpt EGD when he comes off DAPT (utd as of 01/29/18)  . Gout    s/p renal transplant he was weened off of his allopurinol.  .   Hiatal hernia   . 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 History of renal transplant 11/10/2012   10/2012 [facility]   . Hyperlipidemia   . Hypertension   . IFG (impaired fasting glucose)   . implantable cardiac defibrillator single chamber    Medtronic (due to low EF)  . Ischemic cardiomyopathy    Chronic systolic dysfunction--  02/2011 EF 30-35%.   Single chamber ICD 11/20/10 (Dr. Klein)  . Left ventricular thrombus 2012   Re-eval 02/2011 showed thrombus RESOLVED, so coumadin was d/c'd (was on it for 6mo)  . Lupus (HCC)   . OSA  on CPAP 2018   Dr. Young 01/2017: +OSA on home sleep studay; CPAP auto titrate 5-20 started 05/2017.  . Osteoporosis   . Post-transplant erythrocytosis    improved with ARB (valsartan)    Past Surgical History:  Procedure Laterality Date  . AV FISTULA PLACEMENT  03/28/2011   Procedure: ARTERIOVENOUS (AV) FISTULA CREATION;  Surgeon: Todd F Early, MD;  Location: [facility] OR;  Service: Vascular;  Laterality: Left;  Creation of Left radiocephallic cimino fistula  . AV FISTULA PLACEMENT  04/27/2011   Procedure: ARTERIOVENOUS (AV) FISTULA CREATION;  Surgeon: Todd F Early, MD;  Location: [facility] OR;  Service: Vascular;  Laterality: Left;  left basilic vein transposition  . CARDIAC CATHETERIZATION     08/2010  . CARDIOVASCULAR STRESS TEST  07/2010; 03/2014   03/2014 showed large old infarct and EF 30-35% but no ischemia  . DEXA  07/05/2017   Normal bone density in radius and spine.  . ESOPHAGOGASTRODUODENOSCOPY  02/2009   Done by Dr. Stark for hematemesis; esophagitis found.  Repeat recommended 09/2016, at which time he will also likely get his initial screening colonoscopy.  . ESOPHAGOGASTRODUODENOSCOPY (EGD) WITH PROPOFOL N/A 09/29/2015   REPEAT EGD RECOMMENDED 09/2016.  Barrett's esoph + mild chronic gastritis.  H pylori NEG. Procedure: ESOPHAGOGASTRODUODENOSCOPY (EGD) WITH PROPOFOL;  Surgeon: Jay M Pyrtle, MD;  Location: [facility];  Service: Gastroenterology;  Laterality: N/A;  . ICD GENERATOR CHANGEOUT N/A 04/11/2018   Procedure: ICD GENERATOR CHANGEOUT;  Surgeon: Klein, Steven C, MD;  Location: [facility];  Service: Cardiovascular;  Laterality: N/A;  . INSERT / REPLACE / REMOVE PACEMAKER     medtronic        dr nishen    [place]   icd only  . KIDNEY TRANSPLANT  10/2012   [facility] nephrologist--Dr. Matthew Jay Ellis  . LEFT HEART CATH AND CORONARY ANGIOGRAPHY N/A 02/08/2017   PCA and DES to mRCA--needs DAPT for at least 6 mo.   Procedure: LEFT HEART CATH AND CORONARY ANGIOGRAPHY;  Surgeon: Cooper,  Michael, MD;  Location: [facility];  Service: Cardiovascular;  Laterality: N/A;  . PARTIAL HIP ARTHROPLASTY Left 2010   left  . RENAL BIOPSY    . stents     05-2010 and 2- in 2010 and 1 in 2018.  . TOTAL HIP ARTHROPLASTY  07/09/2011   Procedure: TOTAL HIP ARTHROPLASTY;  Surgeon: Frank J Rowan, MD;  Location: [facility] OR;  Service: Orthopedics;  Laterality: Right;  . TYMPANOSTOMY TUBE PLACEMENT  54 yrs old  . US ECHOCARDIOGRAPHY  12/2010; 02/2014;08/2014;08/2019   02/2014 EF still 25-30%, increased PA pressures.  2016 EF 30-35%, sept/inf hypokinesis, mod tricusp regurg. 08/2019 EF 25-30%, global hypok, sev dil LV w/apical aneur, grd III DD, mod tricus reg, mod pulm htn    Current Outpatient Medications    Medication Sig Dispense Refill  . acetaminophen (TYLENOL) 500 MG tablet Take 1,000 mg by mouth every 6 (six) hours as needed for headache (pain).     . allopurinol (ZYLOPRIM) 100 MG tablet Take 1 tablet (100 mg total) by mouth daily. 90 tablet 3  . aspirin EC 81 MG tablet Take 81 mg by mouth daily.    . atorvastatin (LIPITOR) 80 MG tablet Take 80 mg by mouth at bedtime.    . BYSTOLIC 5 MG tablet TAKE 1 TABLET BY MOUTH DAILY 90 tablet 3  . clopidogrel (PLAVIX) 75 MG tablet TAKE 1 TABLET BY MOUTH DAILY 90 tablet 3  . DEXILANT 60 MG capsule TAKE 1 CAPSULE BY MOUTH DAILY BEFORE BREAKFAST 90 capsule 0  . famotidine (PEPCID) 20 MG tablet Take 1 tablet (20 mg total) by mouth at bedtime. 90 tablet 0  . fluticasone (FLONASE) 50 MCG/ACT nasal spray Place into both nostrils daily.    . losartan (COZAAR) 25 MG tablet Take 25 mg by mouth at bedtime.     . magnesium oxide (MAG-OX) 400 MG tablet Take 400 mg by mouth daily with lunch.     . mycophenolate (CELLCEPT) 250 MG capsule Take 3 capsules (750 mg total) by mouth 2 (two) times daily. 540 capsule 1  . niacin (NIASPAN) 500 MG CR tablet TAKE 2 TABLETS BY MOUTH AT BEDTIME 180 tablet 3  . nitroGLYCERIN (NITROSTAT) 0.4 MG SL tablet Place 0.4 mg under the tongue  every 5 (five) minutes as needed for chest pain (DO NOT EXCEED 3 DOSES).    . predniSONE (DELTASONE) 5 MG tablet TAKE 1 TABLET BY MOUTH DAILY 90 tablet 3  . tacrolimus (PROGRAF) 0.5 MG capsule Take 0.5 mg by mouth every 12 (twelve) hours.     . traMADol (ULTRAM) 50 MG tablet Take 50 mg by mouth every 6 (six) hours as needed. for pain  0  . zolpidem (AMBIEN) 5 MG tablet Take 5 mg by mouth at bedtime as needed for sleep.     No current facility-administered medications for this visit.    Allergies as of 08/27/2019 - Review Complete 08/27/2019  Allergen Reaction Noted  . Triptans Palpitations 06/29/2016    Family History  Problem Relation Age of Onset  . Kidney failure Mother   . Lupus Mother   . Stroke Mother   . Diabetes Mother        type 2  . Heart attack Father        X 7  . Hypertension Father   . Hyperlipidemia Father   . Heart attack Sister 47       X 1  . Hyperlipidemia Sister   . Hypertension Sister   . Heart disease Sister   . Heart attack Paternal Grandfather   . Heart disease Paternal Aunt   . Heart disease Paternal Uncle   . Colon cancer Neg Hx   . Esophageal cancer Neg Hx   . Stomach cancer Neg Hx   . Rectal cancer Neg Hx     Social History   Socioeconomic History  . Marital status: Married    Spouse name: Not on file  . Number of children: Not on file  . Years of education: Not on file  . Highest education level: Not on file  Occupational History  . Occupation: CAD Drafter     Employer: [employer]  Tobacco Use  . Smoking status: Former Smoker    Packs/day: 0.50    Years: 30.00    Pack   years: 15.00    Types: Cigarettes    Quit date: 08/02/2010    Years since quitting: 9.0  . Smokeless tobacco: Never Used  Substance and Sexual Activity  . Alcohol use: No    Alcohol/week: 0.0 standard drinks  . Drug use: No  . Sexual activity: Yes    Partners: Female  Other Topics Concern  . Not on file  Social History Narrative   Married, 1 teenage son and 1  teenage daughter.    Occupation: electronic drafter.   15 pack-yr smoking hx, quit 07/2010.   Drug Use - no   No alcohol.            Social Determinants of Health   Financial Resource Strain:   . Difficulty of Paying Living Expenses:   Food Insecurity:   . Worried About Running Out of Food in the Last Year:   . Ran Out of Food in the Last Year:   Transportation Needs:   . Lack of Transportation (Medical):   . Lack of Transportation (Non-Medical):   Physical Activity:   . Days of Exercise per Week:   . Minutes of Exercise per Session:   Stress:   . Feeling of Stress :   Social Connections:   . Frequency of Communication with Friends and Family:   . Frequency of Social Gatherings with Friends and Family:   . Attends Religious Services:   . Active Member of Clubs or Organizations:   . Attends Club or Organization Meetings:   . Marital Status:   Intimate Partner Violence:   . Fear of Current or Ex-Partner:   . Emotionally Abused:   . Physically Abused:   . Sexually Abused:     Review of Systems:    Constitutional: No weight loss, fever or chills Skin: No rash Cardiovascular: No chest pain Respiratory: No SOB  Gastrointestinal: See HPI and otherwise negative Genitourinary: No dysuria Neurological: No headache Musculoskeletal: No new muscle or joint pain Hematologic: No bleeding  Psychiatric: No history of depression or anxiety   Physical Exam:  Vital signs: BP 136/78   Pulse 88   Temp 97.8 F (36.6 C)   Ht 5' 7" (1.702 m)   Wt 236 lb 6 oz (107.2 kg)   BMI 37.02 kg/m   Constitutional:   Pleasant Caucasian male appears to be in NAD, Well developed, Well nourished, alert and cooperative Head:  Normocephalic and atraumatic. Eyes:   PEERL, EOMI. No icterus. Conjunctiva pink. Ears:  Normal auditory acuity. Neck:  Supple Throat: Oral cavity and pharynx without inflammation, swelling or lesion.  Respiratory: Respirations even and unlabored. Lungs clear to  auscultation bilaterally.   No wheezes, crackles, or rhonchi.  Cardiovascular: Normal S1, S2. No MRG. Regular rate and rhythm. No peripheral edema, cyanosis or pallor.  Gastrointestinal:  Soft, nondistended, nontender. No rebound or guarding. Normal bowel sounds. No appreciable masses or hepatomegaly. Rectal:  Not performed.  Msk:  Symmetrical without gross deformities. Without edema, no deformity or joint abnormality.  Neurologic:  Alert and  oriented x4;  grossly normal neurologically.  Skin:   Dry and intact without significant lesions or rashes. Psychiatric:Demonstrates good judgement and reason without abnormal affect or behaviors.  RELEVANT LABS AND IMAGING: CBC    Component Value Date/Time   WBC 10.1 07/31/2019 0000   WBC 11.9 (H) 03/08/2019 0932   RBC 5.62 (A) 07/31/2019 0000   HGB 17.2 07/31/2019 0000   HGB 16.2 03/28/2018 0815   HCT 51 07/31/2019 0000     HCT 48.0 03/28/2018 0815   PLT 206 07/31/2019 0000   PLT 193 03/28/2018 0815   MCV 93.0 03/08/2019 0932   MCV 90 03/28/2018 0815   MCH 30.9 03/08/2019 0932   MCHC 33.3 03/08/2019 0932   RDW 13.7 03/08/2019 0932   RDW 12.9 03/28/2018 0815   LYMPHSABS 1.8 02/27/2017 0822   MONOABS 0.7 02/27/2017 0822   EOSABS 0.4 02/27/2017 0822   BASOSABS 0.1 02/27/2017 0822    CMP     Component Value Date/Time   NA 143 07/31/2019 0000   K 5.0 07/31/2019 0000   CL 101 07/31/2019 0000   CO2 24 (A) 07/31/2019 0000   GLUCOSE 150 (H) 03/08/2019 0932   BUN 16 07/31/2019 0000   CREATININE 1.4 (A) 07/31/2019 0000   CREATININE 1.37 (H) 03/08/2019 0932   CALCIUM 9.7 07/31/2019 0000   CALCIUM 9.1 04/23/2011 0820   PROT 6.6 03/08/2019 0932   ALBUMIN 4.2 07/31/2019 0000   AST 17 07/31/2019 0000   ALT 21 07/31/2019 0000   ALKPHOS 116 07/31/2019 0000   BILITOT 1.6 (H) 03/08/2019 0932   GFRNONAA 60 07/31/2019 0000   GFRAA 69 07/31/2019 0000    Assessment: 1.  History of Barrett's esophagus: On last EGD in 2017, recommendations for  repeat in a year 2.  Screening for colorectal cancer: Patient is 53 and never had a screening colonoscopy 3.  GERD: Continues with some nighttime awakenings regardless of Dexilant 60 mg daily and Pepcid 20 mg nightly; likely related to decreased motility/lupus and diet/lifestyle  Plan: 1.  Scheduled patient for surveillance EGD and screening colonoscopy at the hospital due to his decreased ejection fraction.  These were scheduled with Dr. Pyrtle in May as this is his next opening.  Did discuss risks, benefits, limitations and alternatives and the patient agrees to proceed. 2.  Patient was advised to hold his Plavix for 5 days prior to time of procedures.  We will contact his prescribing physician to ensure that holding his Plavix is acceptable for him. 3.  Increase Pepcid to 40 mg nightly to see if this helps with his nighttime awakenings.  Patient preferred me not to prescribe this and will just take 2 over-the-counter tabs. 4.  Patient is fighting with his insurance to get Dexilant approved. 5.  Patient to follow in clinic per recommendations from Dr. Pyrtle after time of procedures.  Roy Stutsman, PA-C [facility] 08/27/2019, 8:25 AM  Cc: McGowen, Philip H, MD 

## 2019-08-27 NOTE — Telephone Encounter (Signed)
   Primary Cardiologist: [name], MD  Chart reviewed as part of pre-operative protocol coverage. Patient has upcoming colonoscopy planned and we were asked to give our recommendation on holding Plavix.   Per Dr. Johnsie Cancel, [place] to hold Plavix as requested for procedure.   I will route this recommendation to the requesting party via Epic fax function and remove from pre-op pool.  Please call with questions.  Darreld Mclean, PA-C 08/27/2019, 10:03 AM

## 2019-08-27 NOTE — Telephone Encounter (Signed)
Hi Dr. Johnsie Cancel,  Roy Dyer has an upcoming colonoscopy planned for 09/14/2019 and is being asked to hold Plavix. He has a history of CAD with remote stenting to RCA and CX and BMS to LAD in 2012. Most recent cardiac cath in 01/2017 showed widely patent LAD and LCX but 90% stenosis of mid RCA. Treated with DES. He was recently seen by [name] on 08/18/2019 and was doing well from a cardiac standpoint at that time. Is it OK for him to hold Plavix for 5 days prior to procedure?  Please route response to P CV DIV PREOP.  Thank you! Diana Davenport

## 2019-08-27 NOTE — Telephone Encounter (Signed)
[facility] Pre-operative Risk Assessment     Request for surgical clearance:     Endoscopy Procedure  What type of surgery is being performed?     Endo/Colon  When is this surgery scheduled?     09/14/2019 at [facility]  What type of clearance is required ?   Pharmacy  Are there any medications that need to be held prior to surgery and how long? Plavix for 5 days  Practice name and name of physician performing surgery?      [facility]  What is your office phone and fax number?      Phone- [phone]  [phone]  Anesthesia type (None, local, MAC, general) ?       MAC

## 2019-08-31 NOTE — Progress Notes (Signed)
Addendum: Reviewed and agree with assessment and management plan. Aneisa Karren M, MD  

## 2019-08-31 NOTE — Telephone Encounter (Signed)
Following meassage receied from Covermymeds:  Rae Lips Key: Cathren Harsh - PA Case ID: [id number]  Outcome  Approved on April 18  Case [id number] Status:Approved;Review  Type:Prior Auth;Coverage  Start Date:08/25/2019;Coverage End FQHK:[id number]/[id number]  Drug Bystolic 5MG  tablets  Form[employer] Electronic PA Form [id number] NCPDP)   I have notified the pt of this approval through [facility].

## 2019-09-08 ENCOUNTER — Telehealth: Payer: Self-pay | Admitting: General Surgery

## 2019-09-08 NOTE — Telephone Encounter (Signed)
Left detailed voicemail that the patients hospital time has changed to 730am-

## 2019-09-08 NOTE — Telephone Encounter (Signed)
Patient called back and we discussed his procedure moving up to 730am

## 2019-09-08 NOTE — Telephone Encounter (Signed)
Message left on the patients mobile that his procedure has been changed to 730am.

## 2019-09-09 ENCOUNTER — Telehealth: Payer: Self-pay | Admitting: General Surgery

## 2019-09-09 NOTE — Telephone Encounter (Signed)
error 

## 2019-09-09 NOTE — Telephone Encounter (Signed)
Roy Dyer have you seen any prior authorizations for this pt? You were with Roy Dyer the day it was prescribed, it may have been give to you.  Thanks

## 2019-09-10 ENCOUNTER — Other Ambulatory Visit (HOSPITAL_COMMUNITY)
Admission: RE | Admit: 2019-09-10 | Discharge: 2019-09-10 | Disposition: A | Discharge status: Home / Self Care | Payer: Managed Care, Other (non HMO) | Source: Ambulatory Visit | Attending: Internal Medicine | Admitting: Internal Medicine

## 2019-09-10 DIAGNOSIS — Z01812 Encounter for preprocedural laboratory examination: Secondary | ICD-10-CM | POA: Diagnosis not present

## 2019-09-10 DIAGNOSIS — Z20822 Contact with and (suspected) exposure to covid-19: Secondary | ICD-10-CM | POA: Diagnosis not present

## 2019-09-10 LAB — SARS CORONAVIRUS 2 (TAT 6-24 HRS): SARS Coronavirus 2: NEGATIVE

## 2019-09-12 DIAGNOSIS — Z860101 Personal history of adenomatous and serrated colon polyps: Secondary | ICD-10-CM

## 2019-09-12 DIAGNOSIS — Z86010 Personal history of colonic polyps: Secondary | ICD-10-CM

## 2019-09-12 HISTORY — DX: Personal history of adenomatous and serrated colon polyps: Z86.0101

## 2019-09-12 HISTORY — DX: Personal history of colonic polyps: Z86.010

## 2019-09-13 NOTE — Anesthesia Preprocedure Evaluation (Addendum)
Anesthesia Evaluation  Patient identified by MRN, date of birth, ID band Patient awake    Reviewed: Allergy & Precautions, NPO status , Patient's Chart, lab work & pertinent test results  History of Anesthesia Complications Negative for: history of anesthetic complications  Airway Mallampati: I  TM Distance: >3 FB Neck ROM: Full    Dental  (+) Teeth Intact   Pulmonary sleep apnea and Continuous Positive Airway Pressure Ventilation , former smoker,    Pulmonary exam normal        Cardiovascular hypertension, + CAD, + Past MI (2001), + Cardiac Stents and +CHF  Normal cardiovascular exam+ Cardiac Defibrillator      Neuro/Psych negative neurological ROS  negative psych ROS   GI/Hepatic Neg liver ROS, hiatal hernia, GERD  ,  Endo/Other  negative endocrine ROS  Renal/GU Renal InsufficiencyRenal disease (s/p txp 2014)  negative genitourinary   Musculoskeletal  (+) Arthritis ,   Abdominal   Peds  Hematology negative hematology ROS (+)   Anesthesia Other Findings  Lupus  Echo 08/19/19: EF 25-30%, grade 3 dd, normal RV function, moderate pulmonary HTN (PASP 47), mild MR, mod TR, normal AV 2015 Cath: old infarct, no ischemia, EF 35%  Reproductive/Obstetrics                           Anesthesia Physical Anesthesia Plan  ASA: IV  Anesthesia Plan: MAC   Post-op Pain Management:    Induction: Intravenous  PONV Risk Score and Plan: 1 and Propofol infusion, TIVA and Treatment may vary due to age or medical condition  Airway Management Planned: Natural Airway, Nasal Cannula and Simple Face Mask  Additional Equipment: None  Intra-op Plan:   Post-operative Plan:   Informed Consent: I have reviewed the patients History and Physical, chart, labs and discussed the procedure including the risks, benefits and alternatives for the proposed anesthesia with the patient or authorized representative who  has indicated his/her understanding and acceptance.       Plan Discussed with:   Anesthesia Plan Comments:        Anesthesia Quick Evaluation

## 2019-09-14 ENCOUNTER — Ambulatory Visit (HOSPITAL_COMMUNITY): Payer: Managed Care, Other (non HMO) | Admitting: Anesthesiology

## 2019-09-14 ENCOUNTER — Encounter (HOSPITAL_COMMUNITY)
Admission: RE | Disposition: A | Discharge status: Home / Self Care | Payer: Self-pay | Source: Home / Self Care | Attending: Internal Medicine

## 2019-09-14 ENCOUNTER — Other Ambulatory Visit: Payer: Self-pay

## 2019-09-14 ENCOUNTER — Ambulatory Visit (HOSPITAL_COMMUNITY)
Admission: RE | Admit: 2019-09-14 | Discharge: 2019-09-14 | Disposition: A | Discharge status: Home / Self Care | Payer: Managed Care, Other (non HMO) | Attending: Internal Medicine | Admitting: Internal Medicine

## 2019-09-14 ENCOUNTER — Encounter (HOSPITAL_COMMUNITY): Payer: Self-pay | Admitting: Internal Medicine

## 2019-09-14 DIAGNOSIS — G4733 Obstructive sleep apnea (adult) (pediatric): Secondary | ICD-10-CM | POA: Diagnosis not present

## 2019-09-14 DIAGNOSIS — Z955 Presence of coronary angioplasty implant and graft: Secondary | ICD-10-CM | POA: Diagnosis not present

## 2019-09-14 DIAGNOSIS — Z1212 Encounter for screening for malignant neoplasm of rectum: Secondary | ICD-10-CM

## 2019-09-14 DIAGNOSIS — Z95810 Presence of automatic (implantable) cardiac defibrillator: Secondary | ICD-10-CM | POA: Insufficient documentation

## 2019-09-14 DIAGNOSIS — Z79899 Other long term (current) drug therapy: Secondary | ICD-10-CM | POA: Insufficient documentation

## 2019-09-14 DIAGNOSIS — I132 Hypertensive heart and chronic kidney disease with heart failure and with stage 5 chronic kidney disease, or end stage renal disease: Secondary | ICD-10-CM | POA: Diagnosis not present

## 2019-09-14 DIAGNOSIS — Z8719 Personal history of other diseases of the digestive system: Secondary | ICD-10-CM | POA: Diagnosis not present

## 2019-09-14 DIAGNOSIS — N182 Chronic kidney disease, stage 2 (mild): Secondary | ICD-10-CM | POA: Diagnosis not present

## 2019-09-14 DIAGNOSIS — K635 Polyp of colon: Secondary | ICD-10-CM | POA: Diagnosis not present

## 2019-09-14 DIAGNOSIS — Z09 Encounter for follow-up examination after completed treatment for conditions other than malignant neoplasm: Secondary | ICD-10-CM | POA: Diagnosis not present

## 2019-09-14 DIAGNOSIS — M109 Gout, unspecified: Secondary | ICD-10-CM | POA: Diagnosis not present

## 2019-09-14 DIAGNOSIS — I2510 Atherosclerotic heart disease of native coronary artery without angina pectoris: Secondary | ICD-10-CM | POA: Insufficient documentation

## 2019-09-14 DIAGNOSIS — K2100 Gastro-esophageal reflux disease with esophagitis, without bleeding: Secondary | ICD-10-CM | POA: Diagnosis not present

## 2019-09-14 DIAGNOSIS — D123 Benign neoplasm of transverse colon: Secondary | ICD-10-CM | POA: Diagnosis not present

## 2019-09-14 DIAGNOSIS — K449 Diaphragmatic hernia without obstruction or gangrene: Secondary | ICD-10-CM | POA: Insufficient documentation

## 2019-09-14 DIAGNOSIS — K2270 Barrett's esophagus without dysplasia: Secondary | ICD-10-CM

## 2019-09-14 DIAGNOSIS — Z87898 Personal history of other specified conditions: Secondary | ICD-10-CM | POA: Insufficient documentation

## 2019-09-14 DIAGNOSIS — E785 Hyperlipidemia, unspecified: Secondary | ICD-10-CM | POA: Diagnosis not present

## 2019-09-14 DIAGNOSIS — K219 Gastro-esophageal reflux disease without esophagitis: Secondary | ICD-10-CM

## 2019-09-14 DIAGNOSIS — Z7982 Long term (current) use of aspirin: Secondary | ICD-10-CM | POA: Insufficient documentation

## 2019-09-14 DIAGNOSIS — Z1211 Encounter for screening for malignant neoplasm of colon: Secondary | ICD-10-CM | POA: Diagnosis not present

## 2019-09-14 DIAGNOSIS — I509 Heart failure, unspecified: Secondary | ICD-10-CM | POA: Diagnosis not present

## 2019-09-14 DIAGNOSIS — Z7902 Long term (current) use of antithrombotics/antiplatelets: Secondary | ICD-10-CM | POA: Diagnosis not present

## 2019-09-14 DIAGNOSIS — Z940 Kidney transplant status: Secondary | ICD-10-CM | POA: Diagnosis not present

## 2019-09-14 DIAGNOSIS — Z8669 Personal history of other diseases of the nervous system and sense organs: Secondary | ICD-10-CM | POA: Diagnosis not present

## 2019-09-14 HISTORY — PX: POLYPECTOMY: SHX5525

## 2019-09-14 HISTORY — PX: ESOPHAGOGASTRODUODENOSCOPY: SHX1529

## 2019-09-14 HISTORY — PX: ESOPHAGOGASTRODUODENOSCOPY (EGD) WITH PROPOFOL: SHX5813

## 2019-09-14 HISTORY — PX: COLONOSCOPY: SHX174

## 2019-09-14 HISTORY — PX: BIOPSY: SHX5522

## 2019-09-14 HISTORY — PX: COLONOSCOPY WITH PROPOFOL: SHX5780

## 2019-09-14 SURGERY — COLONOSCOPY WITH PROPOFOL
Anesthesia: Monitor Anesthesia Care

## 2019-09-14 SURGERY — Surgical Case
Anesthesia: *Unknown

## 2019-09-14 MED ORDER — PROPOFOL 500 MG/50ML IV EMUL
INTRAVENOUS | Status: AC
  Filled 2019-09-14: qty 50

## 2019-09-14 MED ORDER — PROPOFOL 10 MG/ML IV BOLUS
INTRAVENOUS | Status: AC
  Filled 2019-09-14: qty 20

## 2019-09-14 MED ORDER — SODIUM CHLORIDE 0.9 % IV SOLN: INTRAVENOUS | Status: DC

## 2019-09-14 MED ORDER — PHENYLEPHRINE 40 MCG/ML (10ML) SYRINGE FOR IV PUSH (FOR BLOOD PRESSURE SUPPORT)
PREFILLED_SYRINGE | INTRAVENOUS | Status: DC | PRN
  Administered 2019-09-14: 120 ug via INTRAVENOUS
  Administered 2019-09-14 (×2): 80 ug via INTRAVENOUS

## 2019-09-14 MED ORDER — PROPOFOL 500 MG/50ML IV EMUL
INTRAVENOUS | Status: DC | PRN
  Administered 2019-09-14: 100 ug/kg/min via INTRAVENOUS

## 2019-09-14 MED ORDER — LACTATED RINGERS IV SOLN: INTRAVENOUS | Status: DC

## 2019-09-14 MED ORDER — LIDOCAINE 2% (20 MG/ML) 5 ML SYRINGE
INTRAMUSCULAR | Status: DC | PRN
  Administered 2019-09-14: 100 mg via INTRAVENOUS

## 2019-09-14 MED ORDER — PROPOFOL 10 MG/ML IV BOLUS
INTRAVENOUS | Status: DC | PRN
  Administered 2019-09-14 (×3): 20 mg via INTRAVENOUS

## 2019-09-14 MED ORDER — PHENYLEPHRINE HCL-NACL 10-0.9 MG/250ML-% IV SOLN
INTRAVENOUS | Status: DC | PRN
  Administered 2019-09-14: 40 ug/min via INTRAVENOUS

## 2019-09-14 SURGICAL SUPPLY — 25 items

## 2019-09-14 NOTE — Discharge Instructions (Signed)

## 2019-09-14 NOTE — Interval H&P Note (Signed)
History and Physical Interval Note: For EGD and colonoscopy this morning.  EGD to evaluate reflux with history of esophagitis and Barrett's esophagus.  Last endoscopy 2017.  Reflux have been uncontrolled on Dexilant and Pepcid 20 at bedtime.  [name] at last visit increase Pepcid to 40 mg at bedtime and this has helped significantly.  No dysphagia. Screening colonoscopy.  He tolerated the prep Plavix has been off since 09/08/2019  The nature of the procedure, as well as the risks, benefits, and alternatives were carefully and thoroughly reviewed with the patient. Ample time for discussion and questions allowed. The patient understood, was satisfied, and agreed to proceed.    09/14/2019 7:39 AM  Roy Dyer  has presented today for surgery, with the diagnosis of H/o barretts esophagus, GERD, CRC screening.  The various methods of treatment have been discussed with the patient and family. After consideration of risks, benefits and other options for treatment, the patient has consented to  Procedure(s): COLONOSCOPY WITH PROPOFOL (N/A) ESOPHAGOGASTRODUODENOSCOPY (EGD) WITH PROPOFOL (N/A) as a surgical intervention.  The patient's history has been reviewed, patient examined, no change in status, stable for surgery.  I have reviewed the patient's chart and labs.  Questions were answered to the patient's satisfaction.     Lajuan Lines Pyrtle

## 2019-09-14 NOTE — Anesthesia Procedure Notes (Signed)
Date/Time: 09/14/2019 7:45 AM Performed by: Sharlette Dense, CRNA Oxygen Delivery Method: Simple face mask

## 2019-09-14 NOTE — Op Note (Signed)
[facility] Patient Name: Roy Dyer Procedure Date: 09/14/2019 MRN: [id number] Attending MD: Jerene Bears , MD Date of Birth: June 10, 1965 CSN: [id number] Age: 54 Admit Type: Outpatient Procedure:                Upper GI endoscopy Indications:              Gastro-esophageal reflux disease, Barrett's                            esophagus without dysplasia diagnosed at last EGD                            in 2017 Providers:                Lajuan Lines. Hilarie Fredrickson, MD, Benetta Spar RN, RN, Cherylynn Ridges, Technician, Danley Danker, CRNA Referring MD:             [name], MD Medicines:                Monitored Anesthesia Care Complications:            No immediate complications. Estimated Blood Loss:     Estimated blood loss was minimal. Procedure:                Pre-Anesthesia Assessment:                           - Prior to the procedure, a History and Physical                            was performed, and patient medications and                            allergies were reviewed. The patient's tolerance of                            previous anesthesia was also reviewed. The risks                            and benefits of the procedure and the sedation                            options and risks were discussed with the patient.                            All questions were answered, and informed consent                            was obtained. Prior Anticoagulants: The patient has                            taken Plavix (clopidogrel), last dose was 5 days  prior to procedure. ASA Grade Assessment: III - A                            patient with severe systemic disease. After                            reviewing the risks and benefits, the patient was                            deemed in satisfactory condition to undergo the                            procedure.                           After obtaining informed consent,  the endoscope was                            passed under direct vision. Throughout the                            procedure, the patient's blood pressure, pulse, and                            oxygen saturations were monitored continuously. The                            [id number]-H190 ([id number]) Olympus gastroscope was                            introduced through the mouth, and advanced to the                            second part of duodenum. The upper GI endoscopy was                            accomplished without difficulty. The patient                            tolerated the procedure well. Scope In: Scope Out: Findings:      In the proximal esophagus there was a small inlet patch without       nodularity or visible abnormality.      LA Grade A (one or more mucosal breaks less than 5 mm, not extending       between tops of 2 mucosal folds) esophagitis with no bleeding was found       32 to 34 cm from the incisors.      The esophagus and gastroesophageal junction were examined with white       light and narrow band imaging (NBI) from a forward view and retroflexed       position. There were esophageal mucosal changes consistent with       short-segment Barrett's esophagus, classified as Barrett's stage C1-M3       per Prague criteria. These changes involved the mucosa at the upper  extent of the gastric folds (37 cm from the incisors) extending to the       Z-line (34 cm from the incisors). Circumferential salmon-colored mucosa       was present from 36 to 37 cm and one wide tongue of salmon-colored       mucosa was present from 34 to 36 cm. The maximum longitudinal extent of       these esophageal mucosal changes was 3 cm in length. Mucosa was biopsied       with a cold forceps for histology in a targeted manner at intervals of       1-2 cm from 34 to 37 cm from the incisors. A total of 2 specimen bottles       were sent to pathology.      A 3 cm hiatal hernia was present.       The gastroesophageal flap valve was visualized endoscopically and       classified as Hill Grade IV (no fold, wide open lumen, hiatal hernia       present).      The entire examined stomach was normal.      The examined duodenum was normal. Impression:               - LA Grade A reflux esophagitis with no bleeding.                            Overall esophagitis has improved compared to EGD in                            2017.                           - Esophageal mucosal changes consistent with                            short-segment Barrett's esophagus, classified as                            Barrett's stage C1-M3 per Prague criteria. Biopsied.                           - 3 cm hiatal hernia.                           - Normal stomach.                           - Normal examined duodenum. Moderate Sedation:      N/A Recommendation:           - Patient has a contact number available for                            emergencies. The signs and symptoms of potential                            delayed complications were discussed with the  patient. Return to normal activities tomorrow.                            Written discharge instructions were provided to the                            patient.                           - Resume previous diet.                           - Continue present medications. Continue Dexilant                            60 mg once daily in the morning and famotidine 40                            mg at bedtime.                           - Resume Plavix (clopidogrel) at prior dose                            tomorrow. Refer to managing physician for further                            adjustment of therapy.                           - Await pathology results.                           - Repeat upper endoscopy for surveillance based on                            pathology results. Procedure Code(s):        --- Professional ---                            [phone], Esophagogastroduodenoscopy, flexible,                            transoral; with biopsy, single or multiple Diagnosis Code(s):        --- Professional ---                           K21.00, Gastro-esophageal reflux disease with                            esophagitis, without bleeding                           K22.70, Barrett's esophagus without dysplasia                           K44.9, Diaphragmatic hernia without obstruction or  gangrene CPT copyright 2019 [employer]. All rights reserved. The codes documented in this report are preliminary and upon coder review may  be revised to meet current compliance requirements. Jerene Bears, MD 09/14/2019 8:42:10 AM This report has been signed electronically. Number of Addenda: 0

## 2019-09-14 NOTE — Transfer of Care (Signed)
Immediate Anesthesia Transfer of Care Note  Patient: Roy Dyer  Procedure(s) Performed: COLONOSCOPY WITH PROPOFOL (N/A ) ESOPHAGOGASTRODUODENOSCOPY (EGD) WITH PROPOFOL (N/A ) POLYPECTOMY  Patient Location: Endoscopy Unit  Anesthesia Type:MAC  Level of Consciousness: drowsy  Airway & Oxygen Therapy: Patient Spontanous Breathing and Patient connected to face mask oxygen  Post-op Assessment: Report given to RN and Post -op Vital signs reviewed and stable  Post vital signs: Reviewed and stable  Last Vitals:  Vitals Value Taken Time  BP    Temp    Pulse    Resp    SpO2      Last Pain:  Vitals:   09/14/19 0637  TempSrc: Oral  PainSc:          Complications: No apparent anesthesia complications

## 2019-09-14 NOTE — Op Note (Signed)
[facility] Patient Name: Roy Dyer Procedure Date: 09/14/2019 MRN: [id number] Attending MD: Jerene Bears , MD Date of Birth: January 03, 1966 CSN: [id number] Age: 54 Admit Type: Outpatient Procedure:                Colonoscopy Indications:              Screening for colorectal malignant neoplasm, This                            is the patient's first colonoscopy Providers:                Lajuan Lines. Hilarie Fredrickson, MD, Benetta Spar RN, RN, Cherylynn Ridges, Technician, Danley Danker, CRNA Referring MD:             [name], MD Medicines:                Monitored Anesthesia Care Complications:            No immediate complications. Estimated Blood Loss:     Estimated blood loss was minimal. Procedure:                Pre-Anesthesia Assessment:                           - Prior to the procedure, a History and Physical                            was performed, and patient medications and                            allergies were reviewed. The patient's tolerance of                            previous anesthesia was also reviewed. The risks                            and benefits of the procedure and the sedation                            options and risks were discussed with the patient.                            All questions were answered, and informed consent                            was obtained. Prior Anticoagulants: The patient has                            taken Plavix (clopidogrel), last dose was 5 days                            prior to procedure. ASA Grade Assessment: III - A  patient with severe systemic disease. After                            reviewing the risks and benefits, the patient was                            deemed in satisfactory condition to undergo the                            procedure.                           After obtaining informed consent, the colonoscope                            was  passed under direct vision. Throughout the                            procedure, the patient's blood pressure, pulse, and                            oxygen saturations were monitored continuously. The                            CF-HQ190L ([id number]) Olympus colonoscope was                            introduced through the anus and advanced to the                            cecum, identified by appendiceal orifice and                            ileocecal valve. The colonoscopy was performed                            without difficulty. The patient tolerated the                            procedure well. The quality of the bowel                            preparation was good. The ileocecal valve,                            appendiceal orifice, and rectum were photographed. Scope In: 8:07:13 AM Scope Out: 8:28:41 AM Scope Withdrawal Time: 0 hours 13 minutes 53 seconds  Total Procedure Duration: 0 hours 21 minutes 28 seconds  Findings:      The digital rectal exam was normal.      A 5 mm polyp was found in the transverse colon. The polyp was sessile.       The polyp was removed with a cold snare. Resection and retrieval were       complete.      Multiple small and large-mouthed diverticula were found in  the sigmoid       colon and descending colon.      Internal hemorrhoids were found during retroflexion. Impression:               - One 5 mm polyp in the transverse colon, removed                            with a cold snare. Resected and retrieved.                           - Diverticulosis in the sigmoid colon and in the                            descending colon.                           - Internal hemorrhoids. Moderate Sedation:      N/A Recommendation:           - Patient has a contact number available for                            emergencies. The signs and symptoms of potential                            delayed complications were discussed with the                             patient. Return to normal activities tomorrow.                            Written discharge instructions were provided to the                            patient.                           - Resume previous diet.                           - Continue present medications.                           - Resume Plavix (clopidogrel) at prior dose                            tomorrow. Refer to managing physician for further                            adjustment of therapy.                           - Await pathology results.                           - Repeat colonoscopy is recommended. The  colonoscopy date will be determined after pathology                            results from today's exam become available for                            review. Procedure Code(s):        --- Professional ---                           [phone], Colonoscopy, flexible; with removal of                            tumor(s), polyp(s), or other lesion(s) by snare                            technique Diagnosis Code(s):        --- Professional ---                           Z12.11, Encounter for screening for malignant                            neoplasm of colon                           K63.5, Polyp of colon                           K64.8, Other hemorrhoids                           K57.30, Diverticulosis of large intestine without                            perforation or abscess without bleeding CPT copyright 2019 American [employer]. All rights reserved. The codes documented in this report are preliminary and upon coder review may  be revised to meet current compliance requirements. Jerene Bears, MD 09/14/2019 8:46:54 AM This report has been signed electronically. Number of Addenda: 0

## 2019-09-14 NOTE — Anesthesia Postprocedure Evaluation (Signed)
Anesthesia Post Note  Patient: Roy Dyer  Procedure(s) Performed: COLONOSCOPY WITH PROPOFOL (N/A ) ESOPHAGOGASTRODUODENOSCOPY (EGD) WITH PROPOFOL (N/A ) POLYPECTOMY     Patient location during evaluation: Endoscopy Anesthesia Type: MAC Level of consciousness: awake and alert Pain management: pain level controlled Vital Signs Assessment: post-procedure vital signs reviewed and stable Respiratory status: spontaneous breathing, nonlabored ventilation and respiratory function stable Cardiovascular status: blood pressure returned to baseline and stable Postop Assessment: no apparent nausea or vomiting Anesthetic complications: no    Last Vitals:  Vitals:   09/14/19 0855 09/14/19 0900  BP:  109/66  Pulse: (!) 58 (!) 59  Resp: (!) 24 (!) 26  Temp:    SpO2: 100% 97%    Last Pain:  Vitals:   09/14/19 0900  TempSrc:   PainSc: 0-No pain                 Lidia Collum

## 2019-09-15 ENCOUNTER — Encounter: Payer: Self-pay | Admitting: Family Medicine

## 2019-09-15 LAB — SURGICAL PATHOLOGY

## 2019-09-18 ENCOUNTER — Encounter: Payer: Self-pay | Admitting: Internal Medicine

## 2019-09-22 LAB — CUP PACEART REMOTE DEVICE CHECK
Battery Remaining Longevity: 129 mo
Battery Voltage: 3.01 V
Brady Statistic RV Percent Paced: 4.01 %
Date Time Interrogation Session: 20210511080444
HighPow Impedance: 49 Ohm
HighPow Impedance: 58 Ohm
Implantable Lead Implant Date: 20120709
Implantable Lead Location: 753860
Implantable Lead Model: 6947
Implantable Pulse Generator Implant Date: 20191129
Lead Channel Impedance Value: 285 Ohm
Lead Channel Impedance Value: 361 Ohm
Lead Channel Pacing Threshold Amplitude: 1.25 V
Lead Channel Pacing Threshold Pulse Width: 0.4 ms
Lead Channel Sensing Intrinsic Amplitude: 13.875 mV
Lead Channel Sensing Intrinsic Amplitude: 13.875 mV
Lead Channel Setting Pacing Amplitude: 2.5 V
Lead Channel Setting Pacing Pulse Width: 0.5 ms
Lead Channel Setting Sensing Sensitivity: 0.3 mV

## 2019-09-23 ENCOUNTER — Ambulatory Visit (INDEPENDENT_AMBULATORY_CARE_PROVIDER_SITE_OTHER): Payer: Managed Care, Other (non HMO) | Admitting: *Deleted

## 2019-09-23 DIAGNOSIS — I472 Ventricular tachycardia: Secondary | ICD-10-CM

## 2019-09-23 DIAGNOSIS — I4729 Other ventricular tachycardia: Secondary | ICD-10-CM

## 2019-09-25 NOTE — Progress Notes (Signed)
Remote ICD transmission.   

## 2019-10-07 ENCOUNTER — Encounter: Payer: Self-pay | Admitting: Family Medicine

## 2019-10-07 LAB — BASIC METABOLIC PANEL
BUN: 17 (ref 4–21)
Chloride: 103 (ref 99–108)
Creatinine: 1.3 (ref 0.6–1.3)
Glucose: 124
Potassium: 4.5 (ref 3.4–5.3)
Sodium: 145 (ref 137–147)

## 2019-10-07 LAB — HEPATIC FUNCTION PANEL
ALT: 16 (ref 10–40)
AST: 22 (ref 14–40)
Alkaline Phosphatase: 114 (ref 25–125)
Bilirubin, Total: 1.3

## 2019-10-07 LAB — LIPID PANEL
Cholesterol: 110 (ref 0–200)
HDL: 62 (ref 35–70)
LDL Cholesterol: 31
Triglycerides: 91 (ref 40–160)

## 2019-10-07 LAB — CBC AND DIFFERENTIAL
HCT: 50 (ref 41–53)
Hemoglobin: 17.2 (ref 13.5–17.5)
Neutrophils Absolute: 6
Platelets: 178 (ref 150–399)
WBC: 8.5

## 2019-10-07 LAB — COMPREHENSIVE METABOLIC PANEL
Albumin: 4.3 (ref 3.5–5.0)
Calcium: 9.5 (ref 8.7–10.7)
GFR calc Af Amer: 72
GFR calc non Af Amer: 62
Globulin: 1.8

## 2019-10-07 LAB — CBC: RBC: 5.49 — AB (ref 3.87–5.11)

## 2019-10-13 NOTE — Progress Notes (Signed)
Date:  10/16/2019   ID:  Roy Dyer, DOB [birth year], MRN [id number]  PCP:  [name], MD  Cardiologist:  [name], MD  Electrophysiologist:  None   Evaluation Performed:  Follow-Up Visit  Chief Complaint:  Follow up for CAD   History of Present Illness:    Roy Dyer is a 54 y.o. male with a history of CAD s/p MI in 2001 with remote stenting to the RCA and circumflex and BMS to LAD in 2012. Last cardiac catheterization 02/08/2017 for angina which showed 90% lesion in the mid RCA in which DES was placed, LAD and LCx stents were widely patent. Echocardiogram 04/2016 with LVEF 30% s/p AICD placement per [name]. Also with previous mural apical thrombus who is no longer on anticoagulation, post renal transplant at [facility] in 2012 (donor), history of lupus, OSA on CPAP (followed by Dr. Annamaria Boots), chronic renal insufficiency and Barrett's,   Today, Roy Dyer is doing well from a cardiac perspective.  He has been staying safe from [place], only going out as necessary.  He has no anginal symptoms, no palpitations, shortness of breath, dyspnea, LE swelling, orthopnea, dizziness or syncope.   Echo 08/19/19 with EF 25-30% severe LAE, mild MR moderate TR  Cr 07/31/19 1.54   Had EGD and Colon on 09/14/19 esophagitis improved Barrett's mild colon with one 5 mm polyp removed and hiatal hernia with diverticulosis   He tends to get "agitated " as part of his angina Has not had any Had COVID vaccine   The patient does not have symptoms concerning for COVID-19 infection (fever, chills, cough, or new shortness of breath).    Past Medical History:  Diagnosis Date  . AICD (automatic cardioverter/defibrillator) present    Dr. Paschal Dopp follows remotely-yrly checks, Dr. Jonne Ply  . Avascular necrosis of bone of hip ([place]) 2010 surg   Left hip arthroplasty: from chronic systemic steroids taken for his Lupus  . Barrett's esophagus 2017; 2021  . Blood transfusion without reported diagnosis  2010, 2012  . CAD (coronary artery disease)    a. stents RCA/Circ 2001 b. BMS to LAD 07/2010  c. 01/2017: s/p DES to RCA.   . Cataract    left eye  . CHF (congestive heart failure) ([place])   . Chronic headaches 02/2017   As of 01/2017, HA's no better, neurologist ordered CT.  ESR normal at that time.  HA'S COMPLETELY RESOLVED AFTER HE GOT ON CPAP 2019/2020.  [name] Chronic renal insufficiency, stage II (mild)    GFR 65 ml/min 07/19/15 at local renal f/u (Cr 1.29).  GFR 64 ml/min (cr 1.3) at local renal f/u 01/16/17.  . Erythrocytosis    Pre and post transplant->on losartan for this  . GERD (gastroesophageal reflux disease)    Hx of esoph stricture and dilatation.  +Barrett's esophagus+hx of aspiration pneumonia-->to get rpt EGD when he comes off DAPT (utd as of 01/29/18)  . Gout    s/p renal transplant he was weened off of his allopurinol.  . Hiatal hernia   . History of adenomatous polyp of colon 09/2019   polyp x 1. Recall 5 yrs  . 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 History of renal transplant 11/10/2012   10/2012 [facility]   . Hyperlipidemia   . Hypertension   . IFG (impaired fasting glucose)   . implantable cardiac defibrillator single chamber    Medtronic (due to  low EF)  . Ischemic cardiomyopathy    Chronic systolic dysfunction--  76/5465 EF 30-35%.   Single chamber ICD 11/20/10 ([name])  . Left ventricular thrombus 2012   Re-eval 02/2011 showed thrombus RESOLVED, so coumadin was d/c'd (was on it for 82mo  . Lupus ([place]   . OSA on CPAP 2018   Dr. [name]9/2018: +OSA on home sleep studay; CPAP auto titrate 5-20 started 05/2017.  . Osteoporosis   . Post-transplant erythrocytosis    improved with ARB (valsartan)   Past Surgical History:  Procedure Laterality Date  . AV FISTULA PLACEMENT  03/28/2011   Procedure: ARTERIOVENOUS (AV) FISTULA CREATION;  Surgeon: TRosetta Posner MD;  [facility]  Service: Vascular;   Laterality: Left;  Creation of Left radiocephallic cimino fistula  . AV FISTULA PLACEMENT  04/27/2011   Procedure: ARTERIOVENOUS (AV) FISTULA CREATION;  Surgeon: TRosetta Posner MD;  [place] [facility]  Service: Vascular;  Laterality: Left;  left basilic vein transposition  . BIOPSY  09/14/2019   Procedure: BIOPSY;  Surgeon: PJerene Bears MD;  [facility];  Service: Gastroenterology;;  . CARDIAC CATHETERIZATION     08/2010  . CARDIOVASCULAR STRESS TEST  07/2010; 03/2014   03/2014 showed large old infarct and EF 30-35% but no ischemia  . COLONOSCOPY  09/14/2019   Adenoma x 1.  Recall 5 yrs  . COLONOSCOPY WITH PROPOFOL N/A 09/14/2019   Procedure: COLONOSCOPY WITH PROPOFOL;  Surgeon: PJerene Bears MD;  [facility];  Service: Gastroenterology;  Laterality: N/A;  . DEXA  07/05/2017   Normal bone density in radius and spine.  . ESOPHAGOGASTRODUODENOSCOPY  02/2009   Done by Dr. SFuller Planfor hematemesis; esophagitis found.  Repeat recommended 09/2016, at which time he will also likely get his initial screening colonoscopy.  . ESOPHAGOGASTRODUODENOSCOPY  09/14/2019   esophagitis (improved compared to 2017 EGD), Barrett's.  Stomach and duodenum normal.  . ESOPHAGOGASTRODUODENOSCOPY (EGD) WITH PROPOFOL N/A 09/29/2015   REPEAT EGD RECOMMENDED 09/2016.  Barrett's esoph + mild chronic gastritis.  H pylori NEG. Procedure: ESOPHAGOGASTRODUODENOSCOPY (EGD) WITH PROPOFOL;  Surgeon: JJerene Bears MD;  [facility];  Service: Gastroenterology;  Laterality: N/A;  . ESOPHAGOGASTRODUODENOSCOPY (EGD) WITH PROPOFOL N/A 09/14/2019   Procedure: ESOPHAGOGASTRODUODENOSCOPY (EGD) WITH PROPOFOL;  Surgeon: PJerene Bears MD;  [facility];  Service: Gastroenterology;  Laterality: N/A;  . ICD GENERATOR CHANGEOUT N/A 04/11/2018   Procedure: ICD GENERATOR CHANGEOUT;  Surgeon: KDeboraha Sprang MD;  [facility];  Service: Cardiovascular;  Laterality: N/A;  . INSERT / REPLACE / REMOVE  PACEMAKER     medtronic        dr nFrances Nickels   [place]   icd only  . KIDNEY TRANSPLANT  10/2012   [facility] nephrologist--Dr. MBlair Heys . LEFT HEART CATH AND CORONARY ANGIOGRAPHY N/A 02/08/2017   PCA and DES to mRCA--needs DAPT for at least 6 mo.   Procedure: LEFT HEART CATH AND CORONARY ANGIOGRAPHY;  Surgeon: CSherren Mocha MD;  [facility];  Service: Cardiovascular;  Laterality: N/A;  . PARTIAL HIP ARTHROPLASTY Left 2010   left  . POLYPECTOMY  09/14/2019   Procedure: POLYPECTOMY;  Surgeon: PJerene Bears MD;  [facility];  Service: Gastroenterology;;  . RENAL BIOPSY    . stents     05-2010 and 2- in 2010 and 1 in 2018.  .Marland KitchenTOTAL HIP ARTHROPLASTY  07/09/2011   Procedure: TOTAL HIP ARTHROPLASTY;  Surgeon: FPilar Plate  Karmen Bongo, MD;  Location: [place];  Service: Orthopedics;  Laterality: Right;  . TYMPANOSTOMY TUBE PLACEMENT  54 yrs old  . US ECHOCARDIOGRAPHY  12/2010; 02/2014;08/2014;08/2019   02/2014 EF still 25-30%, increased PA pressures.  2016 EF 30-35%, sept/inf hypokinesis, mod tricusp regurg. 08/2019 EF 25-30%, global hypok, sev dil LV w/apical aneur, grd III DD, mod tricus reg, mod pulm htn     Current Meds  Medication Sig  . allopurinol (ZYLOPRIM) 100 MG tablet Take 1 tablet (100 mg total) by mouth daily.  [name] aspirin EC 81 MG tablet Take 81 mg by mouth daily.  [name] atorvastatin (LIPITOR) 80 MG tablet Take 80 mg by mouth at bedtime.  [name] BYSTOLIC 5 MG tablet TAKE 1 TABLET BY MOUTH DAILY (Patient taking differently: Take 5 mg by mouth daily. )  . clopidogrel (PLAVIX) 75 MG tablet TAKE 1 TABLET BY MOUTH DAILY (Patient taking differently: Take 75 mg by mouth daily. )  . DEXILANT 60 MG capsule TAKE 1 CAPSULE BY MOUTH DAILY BEFORE BREAKFAST (Patient taking differently: Take 60 mg by mouth daily. )  . famotidine (PEPCID) 20 MG tablet Take 1 tablet (20 mg total) by mouth at bedtime. (Patient taking differently: Take 40 mg by mouth at bedtime. )  . fluticasone (FLONASE) 50 MCG/ACT  nasal spray Place 1 spray into both nostrils daily.   [name] losartan (COZAAR) 25 MG tablet Take 25 mg by mouth at bedtime.   . magnesium oxide (MAG-OX) 400 MG tablet Take 400 mg by mouth daily with lunch.   . mycophenolate (CELLCEPT) 250 MG capsule Take 3 capsules (750 mg total) by mouth 2 (two) times daily.  . niacin (NIASPAN) 500 MG CR tablet TAKE 2 TABLETS BY MOUTH AT BEDTIME (Patient taking differently: Take 1,000 mg by mouth at bedtime. )  . nitroGLYCERIN (NITROSTAT) 0.4 MG SL tablet Place 0.4 mg under the tongue every 5 (five) minutes as needed for chest pain (DO NOT EXCEED 3 DOSES).  . predniSONE (DELTASONE) 5 MG tablet TAKE 1 TABLET BY MOUTH DAILY (Patient taking differently: Take 5 mg by mouth daily with breakfast. )  . tacrolimus (PROGRAF) 0.5 MG capsule Take 0.5 mg by mouth every 12 (twelve) hours.   . traMADol (ULTRAM) 50 MG tablet Take 50 mg by mouth every 6 (six) hours as needed. for pain  . zolpidem (AMBIEN) 5 MG tablet Take 5 mg by mouth at bedtime as needed for sleep.     Allergies:   Triptans   Social History   Tobacco Use  . Smoking status: Former Smoker    Packs/day: 0.50    Years: 30.00    Pack years: 15.00    Types: Cigarettes    Quit date: 08/02/2010    Years since quitting: 9.2  . Smokeless tobacco: Never Used  Substance Use Topics  . Alcohol use: No    Alcohol/week: 0.0 standard drinks  . Drug use: No     Family Hx: The patient's family history includes Diabetes in his mother; Heart attack in his father and paternal grandfather; Heart attack (age of onset: 63) in his sister; Heart disease in his paternal aunt, paternal uncle, and sister; Hyperlipidemia in his father and sister; Hypertension in his father and sister; Kidney failure in his mother; Lupus in his mother; Stroke in his mother. There is no history of Colon cancer, Esophageal cancer, Stomach cancer, or Rectal cancer.  ROS:   Please see the history of present illness.     All other systems reviewed  and  are negative.  Prior CV studies:   The following studies were reviewed today:  LEFT HEART CATH AND CORONARY ANGIOGRAPHY 01/2017  Conclusion     Ost RCA to Prox RCA lesion, 0 %stenosed.  Dist RCA lesion, 75 %stenosed.  Prox LAD to Mid LAD lesion, 20 %stenosed.  Ost 1st Mrg to 1st Mrg lesion, 40 %stenosed.  A STENT SYNERGY DES 3X28 drug eluting stent was successfully placed.  Mid RCA lesion, 90 %stenosed.  Post intervention, there is a 0% residual stenosis.  1. Severe calcific RCA stenosis treated successfully with PCI (implantation of a 3.0x28 mm Synergy DES) 2. Patent LAD stent with mild in-stent restenosis 3. Patent LCx stent with mild in-stent restenosis  Recommend: continue DAPT long-term as tolerated (minimum 6 months without interruption)    Echocardiogram 04/23/2016 Study Conclusions  - Left ventricle: The cavity size was moderately dilated. Wall thickness was increased in a pattern of mild LVH. The estimated ejection fraction was 30%. Left ventricular diastolic function parameters were normal. - Left atrium: The atrium was severely dilated. - Atrial septum: No defect or patent foramen ovale was identified. - Pulmonary arteries: PA peak pressure: 55 mm Hg (S).    Labs/Other Tests and Data Reviewed:    EKG:  SR PR 31 old anterior MI PVCls   Recent Labs: 10/07/2019: ALT 16; BUN 17; Creatinine 1.3; Hemoglobin 17.2; Platelets 178; Potassium 4.5; Sodium 145   Recent Lipid Panel Lab Results  Component Value Date/Time   CHOL 110 10/07/2019 12:00 AM   TRIG 91 10/07/2019 12:00 AM   HDL 62 10/07/2019 12:00 AM   CHOLHDL 2 07/15/2018 09:17 AM   LDLCALC 31 10/07/2019 12:00 AM    Wt Readings from Last 3 Encounters:  10/16/19 233 lb (105.7 kg)  09/14/19 236 lb (107 kg)  08/27/19 236 lb 6 oz (107.2 kg)     Objective:    Vital Signs:  BP (!) 142/78   Pulse 74   Ht 5' 7"  (1.702 m)   Wt 233 lb (105.7 kg)   SpO2 98%   BMI 36.49 kg/m     Affect appropriate Healthy:  appears stated age HEENT: normal Neck supple with no adenopathy JVP normal no bruits no thyromegaly Lungs clear with no wheezing and good diaphragmatic motion Heart:  S1/S2 no murmur, no rub, gallop or click PMI normal Abdomen: benighn, BS positve, no tenderness, no AAA no bruit.  No HSM or HJR transplanted kidney in RLQ Distal pulses intact with no bruits No edema Neuro non-focal Skin warm and dry AICD under left clavicle  No muscular weakness s/p right THR   ASSESSMENT & PLAN:    1.  CAD with prior MI x2 and ischemic cardiomyopathy: -Denies anginal of HF symptoms -Last echocardiogram 08/19/19 EF 25-30%  -Has AICD placed for primary prevention, follows with [name] -Last interrogation  with normal device function -Continue ASA, Plavix, atorvastatin, losartan  2.  CKD with prior renal transplant: -Follows with nephrology, Dr. Clover Mealy -Creatinine, 1.54 -Continue prednisone, Prograf and CellCept  3.  HLD: -Last LDL, 40 on 07/17/2018 -Continue statin, niacin therapy -Denies side effects  4.  GERD: -Has known hiatal hernia>> continue with current regimen -Follows with GI on Dexilant and pepcid Recent EGD/Colon with Dr Hilarie Fredrickson ok 09/14/19    COVID-19 Education: The signs and symptoms of COVID-19 were discussed with the patient and how to seek care for testing (follow up with PCP or arrange E-visit). The importance of social distancing was discussed today.  Medication Adjustments/Labs and Tests Ordered: Current medicines are reviewed at length with the patient today.  Concerns regarding medicines are outlined above.   Tests Ordered: No orders of the defined types were placed in this encounter.   Medication Changes: No orders of the defined types were placed in this encounter.   Follow Up:  6 months   Signed, [name], MD  10/16/2019 8:26 AM    [facility]

## 2019-10-15 ENCOUNTER — Encounter: Payer: Self-pay | Admitting: Family Medicine

## 2019-10-16 ENCOUNTER — Ambulatory Visit: Payer: Managed Care, Other (non HMO) | Admitting: Cardiovascular Disease

## 2019-10-16 ENCOUNTER — Other Ambulatory Visit: Payer: Self-pay

## 2019-10-16 VITALS — BP 142/78 | HR 74 | Ht 67.0 in | Wt 233.0 lb

## 2019-10-16 DIAGNOSIS — I2510 Atherosclerotic heart disease of native coronary artery without angina pectoris: Secondary | ICD-10-CM | POA: Diagnosis not present

## 2019-10-16 NOTE — Patient Instructions (Signed)

## 2019-11-23 MED ORDER — NITROGLYCERIN 0.4 MG SL SUBL: 0.4000 mg | SUBLINGUAL_TABLET | SUBLINGUAL | 3 refills | Status: AC | PRN

## 2019-12-10 ENCOUNTER — Encounter: Payer: Self-pay | Admitting: Family Medicine

## 2019-12-13 DIAGNOSIS — R7303 Prediabetes: Secondary | ICD-10-CM

## 2019-12-13 HISTORY — DX: Prediabetes: R73.03

## 2019-12-23 ENCOUNTER — Ambulatory Visit (INDEPENDENT_AMBULATORY_CARE_PROVIDER_SITE_OTHER): Payer: Managed Care, Other (non HMO) | Admitting: *Deleted

## 2019-12-23 DIAGNOSIS — I255 Ischemic cardiomyopathy: Secondary | ICD-10-CM

## 2019-12-24 LAB — CUP PACEART REMOTE DEVICE CHECK
Battery Remaining Longevity: 127 mo
Battery Voltage: 2.99 V
Brady Statistic RV Percent Paced: 2.82 %
Date Time Interrogation Session: 20210811072754
HighPow Impedance: 48 Ohm
HighPow Impedance: 56 Ohm
Implantable Lead Implant Date: 20120709
Implantable Lead Location: 753860
Implantable Lead Model: 6947
Implantable Pulse Generator Implant Date: 20191129
Lead Channel Impedance Value: 285 Ohm
Lead Channel Impedance Value: 361 Ohm
Lead Channel Pacing Threshold Amplitude: 1 V
Lead Channel Pacing Threshold Pulse Width: 0.4 ms
Lead Channel Sensing Intrinsic Amplitude: 15.625 mV
Lead Channel Sensing Intrinsic Amplitude: 15.625 mV
Lead Channel Setting Pacing Amplitude: 2.5 V
Lead Channel Setting Pacing Pulse Width: 0.5 ms
Lead Channel Setting Sensing Sensitivity: 0.3 mV

## 2019-12-28 NOTE — Progress Notes (Signed)
Remote ICD transmission.   

## 2020-01-06 ENCOUNTER — Encounter: Payer: Self-pay | Admitting: Family Medicine

## 2020-01-06 ENCOUNTER — Other Ambulatory Visit: Payer: Self-pay

## 2020-01-06 ENCOUNTER — Ambulatory Visit (INDEPENDENT_AMBULATORY_CARE_PROVIDER_SITE_OTHER): Payer: Managed Care, Other (non HMO) | Admitting: Family Medicine

## 2020-01-06 VITALS — BP 115/80 | HR 80 | Temp 97.6°F | Resp 16 | Ht 67.0 in | Wt 237.8 lb

## 2020-01-06 DIAGNOSIS — Z125 Encounter for screening for malignant neoplasm of prostate: Secondary | ICD-10-CM | POA: Diagnosis not present

## 2020-01-06 DIAGNOSIS — Z940 Kidney transplant status: Secondary | ICD-10-CM

## 2020-01-06 DIAGNOSIS — R7301 Impaired fasting glucose: Secondary | ICD-10-CM

## 2020-01-06 DIAGNOSIS — Z0000 Encounter for general adult medical examination without abnormal findings: Secondary | ICD-10-CM | POA: Diagnosis not present

## 2020-01-06 DIAGNOSIS — I255 Ischemic cardiomyopathy: Secondary | ICD-10-CM

## 2020-01-06 LAB — PSA: PSA: 1.22 ng/mL (ref 0.10–4.00)

## 2020-01-06 LAB — HEMOGLOBIN A1C: Hgb A1c MFr Bld: 6.2 % (ref 4.6–6.5)

## 2020-01-06 LAB — BASIC METABOLIC PANEL
BUN: 15 mg/dL (ref 6–23)
CO2: 28 mEq/L (ref 19–32)
Calcium: 9.2 mg/dL (ref 8.4–10.5)
Chloride: 102 mEq/L (ref 96–112)
Creatinine, Ser: 1.28 mg/dL (ref 0.40–1.50)
GFR: 58.83 mL/min — ABNORMAL LOW (ref >60.00)
Glucose, Bld: 111 mg/dL — ABNORMAL HIGH (ref 70–99)
Potassium: 4.3 mEq/L (ref 3.5–5.1)
Sodium: 141 mEq/L (ref 135–145)

## 2020-01-06 LAB — TSH: TSH: 2.04 u[IU]/mL (ref 0.35–4.50)

## 2020-01-06 NOTE — Progress Notes (Signed)
Office Note 01/06/2020  CC:  Chief Complaint  Patient presents with  . Annual Exam    pt is fasting    HPI:  Roy Dyer is a 54 y.o. White male who is here for annual health maintenance exam.  Feeling well, no acute complaint. He does no exertional physical activity.  Does not eat a very healthy diet. Admits he's pretty sedentary.   Past Medical History:  Diagnosis Date  . AICD (automatic cardioverter/defibrillator) present    Dr. Paschal Dopp follows remotely-yrly checks, Dr. Jonne Ply  . Avascular necrosis of bone of hip ([place]) 2010 surg   Left hip arthroplasty: from chronic systemic steroids taken for his Lupus  . Barrett's esophagus 2017; 2021  . Blood transfusion without reported diagnosis 2010, 2012  . CAD (coronary artery disease)    **DAPT long term recommended by cards**.  a. stents RCA/Circ 2001 b. BMS to LAD 07/2010  c. 01/2017: s/p DES to RCA.   . Cataract    left eye  . CHF (congestive heart failure) ([place])   . Chronic headaches 02/2017   As of 01/2017, HA's no better, neurologist ordered CT.  ESR normal at that time.  HA'S COMPLETELY RESOLVED AFTER HE GOT ON CPAP 2019/2020.  [name] Chronic renal insufficiency, stage II (mild)    GFR 65 ml/min 07/19/15 at local renal f/u (Cr 1.29).  GFR 64 ml/min (cr 1.3) at local renal f/u 01/16/17.  . Erythrocytosis    Pre and post transplant->on losartan for this  . GERD (gastroesophageal reflux disease)    Hx of esoph stricture and dilatation.  +Barrett's esophagus+hx of aspiration pneumonia-->to get rpt EGD when he comes off DAPT (utd as of 01/29/18)  . Gout    s/p renal transplant he was weened off of his allopurinol.  . Hiatal hernia   . History of adenomatous polyp of colon 09/2019   polyp x 1. Recall 5 yrs  . 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 History of renal transplant 11/10/2012   10/2012 [facility]   . Hyperlipidemia   . Hypertension   . IFG  (impaired fasting glucose)   . implantable cardiac defibrillator single chamber    Medtronic (due to low EF)  . Ischemic cardiomyopathy    Chronic systolic dysfunction--  29/9242 EF 30-35%.   Single chamber ICD 11/20/10 ([name])  . Left ventricular thrombus 2012   Re-eval 02/2011 showed thrombus RESOLVED, so coumadin was d/c'd (was on it for 33mo  . Lupus ([place]   . OSA on CPAP 2018   Dr. [name]9/2018: +OSA on home sleep studay; CPAP auto titrate 5-20 started 05/2017.  . Osteoporosis   . Post-transplant erythrocytosis    improved with ARB (valsartan)    Past Surgical History:  Procedure Laterality Date  . AV FISTULA PLACEMENT  03/28/2011   Procedure: ARTERIOVENOUS (AV) FISTULA CREATION;  Surgeon: TRosetta Posner MD;  [facility]  Service: Vascular;  Laterality: Left;  Creation of Left radiocephallic cimino fistula  . AV FISTULA PLACEMENT  04/27/2011   Procedure: ARTERIOVENOUS (AV) FISTULA CREATION;  Surgeon: TRosetta Posner MD;  [place]  Service: Vascular;  Laterality: Left;  left basilic vein transposition  . BIOPSY  09/14/2019   Procedure: BIOPSY;  Surgeon: PJerene Bears MD;  [facility];  Service: Gastroenterology;;  . CARDIAC CATHETERIZATION     08/2010  . CARDIOVASCULAR STRESS TEST  07/2010; 03/2014  03/2014 showed large old infarct and EF 30-35% but no ischemia  . COLONOSCOPY  09/14/2019   Adenoma x 1.  Recall 5 yrs  . COLONOSCOPY WITH PROPOFOL N/A 09/14/2019   Procedure: COLONOSCOPY WITH PROPOFOL;  Surgeon: Jerene Bears, MD;  [facility];  Service: Gastroenterology;  Laterality: N/A;  . DEXA  07/05/2017   Normal bone density in radius and spine.  . ESOPHAGOGASTRODUODENOSCOPY  02/2009   Done by Dr. Fuller Plan for hematemesis; esophagitis found.  Repeat recommended 09/2016, at which time he will also likely get his initial screening colonoscopy.  . ESOPHAGOGASTRODUODENOSCOPY  09/14/2019   esophagitis (improved compared to 2017 EGD), Barrett's.  Stomach  and duodenum normal.  . ESOPHAGOGASTRODUODENOSCOPY (EGD) WITH PROPOFOL N/A 09/29/2015   REPEAT EGD RECOMMENDED 09/2016.  Barrett's esoph + mild chronic gastritis.  H pylori NEG. Procedure: ESOPHAGOGASTRODUODENOSCOPY (EGD) WITH PROPOFOL;  Surgeon: Jerene Bears, MD;  [facility];  Service: Gastroenterology;  Laterality: N/A;  . ESOPHAGOGASTRODUODENOSCOPY (EGD) WITH PROPOFOL N/A 09/14/2019   Procedure: ESOPHAGOGASTRODUODENOSCOPY (EGD) WITH PROPOFOL;  Surgeon: Jerene Bears, MD;  [facility];  Service: Gastroenterology;  Laterality: N/A;  . ICD GENERATOR CHANGEOUT N/A 04/11/2018   Procedure: ICD GENERATOR CHANGEOUT;  Surgeon: Deboraha Sprang, MD;  Location: [place] [facility];  Service: Cardiovascular;  Laterality: N/A;  . INSERT / REPLACE / REMOVE PACEMAKER     medtronic        dr [name]    [place]   icd only  . KIDNEY TRANSPLANT  10/2012   [facility] nephrologist--Dr. Blair Heys  . LEFT HEART CATH AND CORONARY ANGIOGRAPHY N/A 02/08/2017   PCA and DES to mRCA--needs DAPT for at least 6 mo.   Procedure: LEFT HEART CATH AND CORONARY ANGIOGRAPHY;  Surgeon: Sherren Mocha, MD;  Location: [place] [facility];  Service: Cardiovascular;  Laterality: N/A;  . PARTIAL HIP ARTHROPLASTY Left 2010   left  . POLYPECTOMY  09/14/2019   Procedure: POLYPECTOMY;  Surgeon: Jerene Bears, MD;  [facility];  Service: Gastroenterology;;  . RENAL BIOPSY    . stents     05-2010 and 2- in 2010 and 1 in 2018.  [name] TOTAL HIP ARTHROPLASTY  07/09/2011   Procedure: TOTAL HIP ARTHROPLASTY;  Surgeon: Kerin Salen, MD;  Location: [place];  Service: Orthopedics;  Laterality: Right;  . TYMPANOSTOMY TUBE PLACEMENT  54 yrs old  . US ECHOCARDIOGRAPHY  12/2010; 02/2014;08/2014;08/2019   02/2014 EF still 25-30%, increased PA pressures.  2016 EF 30-35%, sept/inf hypokinesis, mod tricusp regurg. 08/2019 EF 25-30%, global hypok, sev dil LV w/apical aneur, grd III DD, mod tricus reg, mod pulm htn    Family History   Problem Relation Age of Onset  . Kidney failure Mother   . Lupus Mother   . Stroke Mother   . Diabetes Mother        type 2  . Heart attack Father        X 7  . Hypertension Father   . Hyperlipidemia Father   . Heart attack Sister 88       X 1  . Hyperlipidemia Sister   . Hypertension Sister   . Heart disease Sister   . Heart attack Paternal Grandfather   . Heart disease Paternal Aunt   . Heart disease Paternal Uncle   . Colon cancer Neg Hx   . Esophageal cancer Neg Hx   . Stomach cancer Neg Hx   . Rectal cancer Neg Hx  Social History   Socioeconomic History  . Marital status: Married    Spouse name: Not on file  . Number of children: 2  . Years of education: Not on file  . Highest education level: Not on file  Occupational History  . Occupation: CAD Drafter     Employer: [employer]  Tobacco Use  . Smoking status: Former Smoker    Packs/day: 0.50    Years: 30.00    Pack years: 15.00    Types: Cigarettes    Quit date: 08/02/2010    Years since quitting: 9.4  . Smokeless tobacco: Never Used  Vaping Use  . Vaping Use: Never used  Substance and Sexual Activity  . Alcohol use: No    Alcohol/week: 0.0 standard drinks  . Drug use: No  . Sexual activity: Yes    Partners: Female  Other Topics Concern  . Not on file  Social History Narrative   Married, 1 teenage son and 1 teenage daughter.    Occupation: Printmaker.   15 pack-yr smoking hx, quit 07/2010.   Drug Use - no   No alcohol.            Social Determinants of Health   Financial Resource Strain:   . Difficulty of Paying Living Expenses: Not on file  Food Insecurity:   . Worried About Charity fundraiser in the Last Year: Not on file  . Ran Out of Food in the Last Year: Not on file  Transportation Needs:   . Lack of Transportation (Medical): Not on file  . Lack of Transportation (Non-Medical): Not on file  Physical Activity:   . Days of Exercise per Week: Not on file  . Minutes of Exercise  per Session: Not on file  Stress:   . Feeling of Stress : Not on file  Social Connections:   . Frequency of Communication with Friends and Family: Not on file  . Frequency of Social Gatherings with Friends and Family: Not on file  . Attends Religious Services: Not on file  . Active Member of Clubs or Organizations: Not on file  . Attends Archivist Meetings: Not on file  . Marital Status: Not on file  Intimate Partner Violence:   . Fear of Current or Ex-Partner: Not on file  . Emotionally Abused: Not on file  . Physically Abused: Not on file  . Sexually Abused: Not on file    Outpatient Medications Prior to Visit  Medication Sig Dispense Refill  . allopurinol (ZYLOPRIM) 100 MG tablet Take 1 tablet (100 mg total) by mouth daily. 90 tablet 3  . aspirin EC 81 MG tablet Take 81 mg by mouth daily.    [name] atorvastatin (LIPITOR) 80 MG tablet Take 80 mg by mouth at bedtime.    [name] BYSTOLIC 5 MG tablet TAKE 1 TABLET BY MOUTH DAILY (Patient taking differently: Take 5 mg by mouth daily. ) 90 tablet 3  . clopidogrel (PLAVIX) 75 MG tablet TAKE 1 TABLET BY MOUTH DAILY (Patient taking differently: Take 75 mg by mouth daily. ) 90 tablet 3  . DEXILANT 60 MG capsule TAKE 1 CAPSULE BY MOUTH DAILY BEFORE BREAKFAST (Patient taking differently: Take 60 mg by mouth daily. ) 90 capsule 0  . famotidine (PEPCID) 20 MG tablet Take 1 tablet (20 mg total) by mouth at bedtime. (Patient taking differently: Take 40 mg by mouth 2 (two) times daily. ) 90 tablet 0  . fluticasone (FLONASE) 50 MCG/ACT nasal spray Place 1 spray  into both nostrils daily.     [name] losartan (COZAAR) 25 MG tablet Take 25 mg by mouth at bedtime.     . magnesium oxide (MAG-OX) 400 MG tablet Take 400 mg by mouth daily with lunch.     . mycophenolate (CELLCEPT) 250 MG capsule Take 3 capsules (750 mg total) by mouth 2 (two) times daily. 540 capsule 1  . niacin (NIASPAN) 500 MG CR tablet TAKE 2 TABLETS BY MOUTH AT BEDTIME (Patient taking  differently: Take 1,000 mg by mouth at bedtime. ) 180 tablet 3  . predniSONE (DELTASONE) 5 MG tablet TAKE 1 TABLET BY MOUTH DAILY (Patient taking differently: Take 5 mg by mouth daily with breakfast. ) 90 tablet 3  . tacrolimus (PROGRAF) 0.5 MG capsule Take 0.5 mg by mouth every 12 (twelve) hours.     . nitroGLYCERIN (NITROSTAT) 0.4 MG SL tablet Place 1 tablet (0.4 mg total) under the tongue every 5 (five) minutes as needed for chest pain (DO NOT EXCEED 3 DOSES). (Patient not taking: Reported on 01/06/2020) 25 tablet 3  . traMADol (ULTRAM) 50 MG tablet Take 50 mg by mouth every 6 (six) hours as needed. for pain (Patient not taking: Reported on 01/06/2020)  0  . zolpidem (AMBIEN) 5 MG tablet Take 5 mg by mouth at bedtime as needed for sleep. (Patient not taking: Reported on 01/06/2020)     No facility-administered medications prior to visit.    Allergies  Allergen Reactions  . Triptans Palpitations    Reaction to [place]    ROS Review of Systems  Constitutional: Negative for appetite change, chills, fatigue and fever.  HENT: Negative for congestion, dental problem, ear pain and sore throat.   Eyes: Negative for discharge, redness and visual disturbance.  Respiratory: Negative for cough, chest tightness, shortness of breath and wheezing.   Cardiovascular: Negative for chest pain, palpitations and leg swelling.  Gastrointestinal: Negative for abdominal pain, blood in stool, diarrhea, nausea and vomiting.  Genitourinary: Negative for difficulty urinating, dysuria, flank pain, frequency, hematuria and urgency.  Musculoskeletal: Negative for arthralgias, back pain, joint swelling, myalgias and neck stiffness.  Skin: Negative for pallor and rash.  Neurological: Negative for dizziness, speech difficulty, weakness and headaches.  Hematological: Negative for adenopathy. Does not bruise/bleed easily.  Psychiatric/Behavioral: Negative for confusion and sleep disturbance. The patient is not  nervous/anxious.     PE; Vitals with BMI 01/06/2020 10/16/2019 09/14/2019  Height _0  _1  -  Weight 237 lbs 13 oz 233 lbs -  BMI 26.94 85.46 -  Systolic 270 350 093  Diastolic 80 78 66  Pulse 80 74 59  O2 sat 96% on RA today  Gen: Alert, well appearing.  Patient is oriented to person, place, time, and situation. AFFECT: pleasant, lucid thought and speech. ENT: Ears: EACs clear, normal epithelium.  TMs with good light reflex and landmarks bilaterally.  Eyes: no injection, icteris, swelling, or exudate.  EOMI, PERRLA. Nose: no drainage or turbinate edema/swelling.  No injection or focal lesion.  Mouth: lips without lesion/swelling.  Oral mucosa pink and moist.  Dentition intact and without obvious caries or gingival swelling.  Oropharynx without erythema, exudate, or swelling.  Neck: supple/nontender.  No LAD, mass, or TM.  Carotid pulses 2+ bilaterally, without bruits. CV: RRR, no m/r/g.   LUNGS: CTA bilat, nonlabored resps, good aeration in all lung fields. ABD: soft, NT, ND, BS normal.  No hepatospenomegaly or mass.  No bruits. EXT: no clubbing, cyanosis, or edema.  Musculoskeletal: no joint  swelling, erythema, warmth, or tenderness.  ROM of all joints intact. Skin - no sores or suspicious lesions or rashes or color changes Rectal exam: negative without mass, lesions or tenderness, PROSTATE EXAM: smooth and symmetric without nodules or tenderness.   Pertinent labs:  Lab Results  Component Value Date   TSH 0.52 09/03/2013   Lab Results  Component Value Date   WBC 8.5 10/07/2019   HGB 17.2 10/07/2019   HCT 50 10/07/2019   MCV 93.0 03/08/2019   PLT 178 10/07/2019   Lab Results  Component Value Date   CREATININE 1.3 10/07/2019   BUN 17 10/07/2019   NA 145 10/07/2019   K 4.5 10/07/2019   CL 103 10/07/2019   CO2 24 (A) 07/31/2019   Lab Results  Component Value Date   ALT 16 10/07/2019   AST 22 10/07/2019   ALKPHOS 114 10/07/2019   BILITOT 1.6 (H) 03/08/2019   Lab  Results  Component Value Date   CHOL 110 10/07/2019   Lab Results  Component Value Date   HDL 62 10/07/2019   Lab Results  Component Value Date   LDLCALC 31 10/07/2019   Lab Results  Component Value Date   TRIG 91 10/07/2019   Lab Results  Component Value Date   CHOLHDL 2 07/15/2018   Lab Results  Component Value Date   PSA 1.70 07/15/2018   PSA 1.71 10/23/2010   Lab Results  Component Value Date   HGBA1C 5.6 04/15/2018   ASSESSMENT AND PLAN:   Health maintenance exam: Reviewed age and gender appropriate health maintenance issues (prudent diet, regular exercise, health risks of tobacco and excessive alcohol, use of seatbelts, fire alarms in home, use of sunscreen).  Also reviewed age and gender appropriate health screening as well as vaccine recommendations. Vaccines: he has had covid 19 PLUS booster.  Shingrix->he'll run this by his transplant surgeon in dec this year.  Otherwise all UTD. Labs: BMET, TSH, PSA, Hba1c (IFG).  CBC and lipids and hepatic panel great 3 mo ago. Prostate ca screening: DRE , PSA. Colon ca screening: Recent colonoscopy 09/2019 with adenomatous polyps-> rpt 2026. He gets annual skin ca screening at derm. Dental preventatives UTD.  An After Visit Summary was printed and given to the patient.  FOLLOW UP:  Return in about 6 months (around 07/08/2020) for routine chronic illness f/u.  Signed:  Crissie Sickles, MD           01/06/2020

## 2020-01-06 NOTE — Patient Instructions (Signed)

## 2020-02-01 ENCOUNTER — Ambulatory Visit: Payer: Managed Care, Other (non HMO) | Admitting: Internal Medicine

## 2020-02-01 ENCOUNTER — Other Ambulatory Visit: Payer: Self-pay

## 2020-02-01 ENCOUNTER — Encounter: Payer: Self-pay | Admitting: Internal Medicine

## 2020-02-01 VITALS — BP 116/70 | HR 85 | Temp 96.7°F | Ht 67.0 in | Wt 230.8 lb

## 2020-02-01 DIAGNOSIS — N182 Chronic kidney disease, stage 2 (mild): Secondary | ICD-10-CM | POA: Diagnosis not present

## 2020-02-01 DIAGNOSIS — Z23 Encounter for immunization: Secondary | ICD-10-CM

## 2020-02-01 DIAGNOSIS — G4733 Obstructive sleep apnea (adult) (pediatric): Secondary | ICD-10-CM

## 2020-02-01 NOTE — Patient Instructions (Signed)
Order- DME [facility]- please establish with [facility] branch to continue CPAP auto 5-12, mask of choice, humidifier, supplies, AirView/ card Please let him try nasal pillows mask  Order- standard flu vax   Please call if we can help

## 2020-02-01 NOTE — Progress Notes (Signed)
HPI male former smoker followed for OSA, complicated by Lupus, HBP, CAD, MI/CM/CHF/AICD, renal transplant, GERD/Barrett's HST 04/24/17-AHI 15/hour, desaturation to 84%, body weight 221 pounds  ------------------------------------------------------------------------------------------------   01/30/2019- 54 year old male former smoker followed for OSA, complicated by Lupus, HBP, CAD, MI/CM/CHF/AICD, renal transplant, GERD/Barrett's CPAP auto 5-12/ [facility] Download compliance 90%, AHI 2.6/ hr. -----Follows for: OSA Body weight today 232 lbs Had flu vax He is very comfortable with CPAP. Download reviewed. Some leak but it doesn't bother him. Pressure was reduced to help. Mask fitting option discussed and offered. GERD with occasional recognized aspiration, after which he coughs for a few days. Pending appt with his GI.  Had Flu vax. Kidney transplant still doing well. ICD battery replaced this year.   02/01/20- 54 year old male former smoker followed for OSA, complicated by Lupus, HBP, CAD, MI/CM/CHF/AICD, renal transplant, GERD/Barrett's CPAP auto 5-12/ [facility]/ [facility] Download compliance 90%, AHI4.8/ hr Body weight today- 230 ;bs Covid Vax- 3 Phizer Flu vax- today ------mask leaking, drying eyes out.  Cannot get mask tight enough. He has looked into options and wants to try nasal mask. Has chin strap but doesn't like using. Discussed So-Clean. His CPAP is noisey and he wonders if So-Clean bad for motor.  ROS-see HPI   + = Positive Constitutional:    weight loss, night sweats, fevers, chills, fatigue, lassitude. HEENT:    + headaches, difficulty swallowing, tooth/dental problems, sore throat,       sneezing, itching, ear ache, nasal congestion, post nasal drip, snoring CV:    chest pain, orthopnea, PND, swelling in lower extremities, anasarca,                                                    dizziness, palpitations Resp:   shortness of breath with exertion or at rest.                 productive cough,   non-productive cough, coughing up of blood.              change in color of mucus.  wheezing.   Skin:    rash or lesions. GI:  No-   heartburn, indigestion, abdominal pain, nausea, vomiting, diarrhea,                 change in bowel habits, loss of appetite GU: dysuria, change in color of urine, no urgency or frequency.   flank pain. MS:   joint pain, stiffness, decreased range of motion, back pain. Neuro-     nothing unusual Psych:  change in mood or affect.  depression or anxiety.   memory loss.  OBJ- Physical Exam General- Alert, Oriented, Affect-appropriate, Distress- none acute, + obese Skin- rash-none, lesions- none, excoriation- none Lymphadenopathy- none Head- atraumatic            Eyes- Gross vision intact, PERRLA, conjunctivae and secretions clear            Ears- Hearing, canals-normal            Nose- Clear, no-Septal dev, mucus, polyps, erosion, perforation             Throat- Mallampati II , mucosa clear , drainage- none, tonsils- atrophic Neck- flexible , trachea midline, no stridor , thyroid nl, carotid no bruit Chest - symmetrical excursion , unlabored  Heart/CV- RRR , no murmur , no gallop  , no rub, nl s1 s2                           - JVD- none , edema- none, stasis changes- none, varices- none           Lung- clear to P&A, wheeze- none, cough- none , dullness-none, rub- none           Chest wall- + AICD R Abd-  Br/ Gen/ Rectal- Not done, not indicated Extrem- cyanosis- none, clubbing, none, atrophy- none, strength- nl Neuro- grossly intact to observation

## 2020-02-02 LAB — BASIC METABOLIC PANEL
BUN: 15 (ref 4–21)
CO2: 25 — AB (ref 13–22)
Chloride: 102 (ref 99–108)
Creatinine: 1.4 — AB (ref 0.6–1.3)
Glucose: 119
Potassium: 4.9 (ref 3.4–5.3)
Sodium: 142 (ref 137–147)

## 2020-02-02 LAB — COMPREHENSIVE METABOLIC PANEL
Albumin: 4.2 (ref 3.5–5.0)
Calcium: 9.9 (ref 8.7–10.7)
GFR calc Af Amer: 64
GFR calc non Af Amer: 55

## 2020-02-02 LAB — HEPATIC FUNCTION PANEL
AST: 18 (ref 14–40)
Alkaline Phosphatase: 103 (ref 25–125)
Alkaline Phosphatase: 103 (ref 25–125)
Alkaline Phosphatase: 103 (ref 25–125)
Alkaline Phosphatase: 16 — AB (ref 25–125)

## 2020-02-02 LAB — CBC AND DIFFERENTIAL
HCT: 51 (ref 41–53)
Hemoglobin: 17.2 (ref 13.5–17.5)
Neutrophils Absolute: 5.7
Platelets: 201 (ref 150–399)

## 2020-02-09 ENCOUNTER — Encounter: Payer: Self-pay | Admitting: Internal Medicine

## 2020-02-09 NOTE — Assessment & Plan Note (Signed)
Benefits from CPAP. Not sure about motor noise he can ask DME to service machine. He would like to try nasal pillows mask Plan- continue auto 5-12

## 2020-02-09 NOTE — Assessment & Plan Note (Signed)
He reports transplant is functioning very well, followed by transplant/ nephrology

## 2020-03-14 DIAGNOSIS — G4733 Obstructive sleep apnea (adult) (pediatric): Secondary | ICD-10-CM

## 2020-03-14 NOTE — Telephone Encounter (Signed)
Dr. Young, please see pt's mychart message and advise. 

## 2020-03-15 NOTE — Addendum Note (Signed)
Addended by: Lorretta Harp on: 03/15/2020 04:46 PM   Modules accepted: Orders

## 2020-03-15 NOTE — Telephone Encounter (Signed)
Please order [facility] [place] to service or replace CPAP machine, current settings. Roy Dyer reports his CPAP was damaged.

## 2020-03-16 NOTE — Telephone Encounter (Signed)
PCC's - [facility] [place] office is stating they have not received the cpap order. Order was placed 03/15/20 by Dr. Annamaria Boots for new cpap.

## 2020-03-23 ENCOUNTER — Ambulatory Visit (INDEPENDENT_AMBULATORY_CARE_PROVIDER_SITE_OTHER): Payer: Managed Care, Other (non HMO)

## 2020-03-23 DIAGNOSIS — I255 Ischemic cardiomyopathy: Secondary | ICD-10-CM | POA: Diagnosis not present

## 2020-03-23 LAB — CUP PACEART REMOTE DEVICE CHECK
Battery Remaining Longevity: 125 mo
Battery Voltage: 2.99 V
Brady Statistic RV Percent Paced: 6.24 %
Date Time Interrogation Session: 20211110001603
HighPow Impedance: 50 Ohm
HighPow Impedance: 60 Ohm
Implantable Lead Implant Date: 20120709
Implantable Lead Location: 753860
Implantable Lead Model: 6947
Implantable Pulse Generator Implant Date: 20191129
Lead Channel Impedance Value: 285 Ohm
Lead Channel Impedance Value: 361 Ohm
Lead Channel Pacing Threshold Amplitude: 1.125 V
Lead Channel Pacing Threshold Pulse Width: 0.4 ms
Lead Channel Sensing Intrinsic Amplitude: 16 mV
Lead Channel Sensing Intrinsic Amplitude: 16 mV
Lead Channel Setting Pacing Amplitude: 2.5 V
Lead Channel Setting Pacing Pulse Width: 0.5 ms
Lead Channel Setting Sensing Sensitivity: 0.3 mV

## 2020-03-24 NOTE — Telephone Encounter (Signed)
I called and spoke with Manuela Schwartz with [facility] she stated she just spoke with Roy Dyer 2 weeks ago. She also stated that she just spoke with the patient about 2:30pm today.  She tried to explain to Roy Dyer that insurance would not cover for a new Cpap Machine because it has not been 5 years.  Warranty would not cover the repairs because he was using the So Clean Machine and now his Cpap is working but it is really loud.  Manuela Schwartz stated that she told Roy Dyer that he would have to pay for the repairs our of pocket and pay for a rental CPap until he could get his current Cpap repaired.  She gave him [employer] .com to order a new Cpap machine. Roy Dyer told me that they were 1,200.00. Roy Dyer also stated that Manuela Schwartz quoted 680.00 for Cpap machine.  If he can get a new Cpap machine for 680.00 he can pay out of pocket but not the 1,200.00.  Manuela Schwartz with [facility] stated to send a Replacement Cpap order on template and state patient to pay out of pocket. This would only take care of the Cpap no supplies

## 2020-03-24 NOTE — Progress Notes (Signed)
Remote ICD transmission.   

## 2020-03-24 NOTE — Telephone Encounter (Signed)
Order had been placed on 11/2 for pt to either have his current cpap machine repaired or replaced.  See a message from 03/18/20 in referrals tab that states pt will have to self pay for a new machine as he has not had his machine 5 years.  Called and spoke with pt who states he has not heard anything from [facility] about receiving a new machine. Pt said that he was using a So Clean machine with his cpap which destroyed his machine. Pt also was told that doing repairs to his machine would be about the same cost as replacing the machine so he was planning on paying for a new machine but has not heard anything at all from DME.  [facility], please advise.

## 2020-04-05 NOTE — Telephone Encounter (Signed)
Order placed will route back to [place]. Nothing further needed

## 2020-04-05 NOTE — Addendum Note (Signed)
Addended by: Lia Foyer R on: 04/05/2020 03:34 PM   Modules accepted: Orders

## 2020-04-13 HISTORY — PX: OTHER SURGICAL HISTORY: SHX169

## 2020-04-17 NOTE — H&P (View-Only) (Signed)
Virtual Visit via Telephone Note   This visit type was conducted due to national recommendations for restrictions regarding the COVID-19 Pandemic (e.g. social distancing) in an effort to limit this patient's exposure and mitigate transmission in our community.  Due to his co-morbid illnesses, this patient is at least at moderate risk for complications without adequate follow up.  This format is felt to be most appropriate for this patient at this time.  The patient did not have access to video technology/had technical difficulties with video requiring transitioning to audio format only (telephone).  All issues noted in this document were discussed and addressed.  No physical exam could be performed with this format.  Please refer to the patient's chart for his  consent to telehealth for [facility].    Date:  04/18/2020   ID:  Roy Dyer, DOB 1966-04-07, MRN [id number] The patient was identified using 2 identifiers.  Patient Location: Home Provider Location: Home Office  PCP:  [name], MD  Cardiologist:  [name], MD  Electrophysiologist:  None   Evaluation Performed:  Follow-Up Visit  Chief Complaint:  SOB  History of Present Illness:    Roy Dyer is a 55 y.o. male with a history of CAD s/p MI in 2001 with remote stenting to the RCA and circumflex and BMS to LAD in 2012. Last cardiac catheterization 02/08/2017 for angina which showed 90% lesion in the mid RCA in which DES was placed, LAD and LCx stents were widely patent. Echocardiogram 04/2016 with LVEF 30% s/p AICD placement per [name]. Also with previous mural apical thrombus who is no longer on anticoagulation, post renal transplant at [facility] in 2012 (donor), history of lupus, OSA on CPAP (followed by Dr. Annamaria Boots), chronic renal insufficiency and Barrett's, seen for Dr. Johnsie Cancel.   He was seen by [name] on 07/14/2018 for follow-up of ICD implantation for primary prevention of ischemic cardiomyopathy. Defibrillator  was interrogated personally by [name] at that time with no changes in programming.  Last interrogation 12/23/2018 with normal device function.   He was then seen by myself on 02/02/2019 in follow-up and was doing well from a CV standpoint. Given duration of time since last echocardiogram in 2017 we discussed repeating this. Also discussed his Plavix therapy. Per cath report, in 2018 he had in-stent restenosis and therefore likely the reason his Plavix has not been stopped.   He was then seen in follow-up 08/18/2019 with [name] at which time he had no signs of ischemia however did have ongoing episodes of nonsustained ventricular tachycardia.  No medication changes were made.  He underwent an echocardiogram 08/19/2019 that showed an LVEF of 25 to 30% with severe LAE with mild MR and moderate TR. He was last seen by Dr. Johnsie Cancel 10/16/2019 follow-up with no anginal symptoms  Last device interrogation 03/23/2020 with normal device functioning reviewed by [name].  Today Roy Dyer states that for the last 2 to 3 weeks he has been noticing increased shortness of breath.  Previously a set of 10 stairs would be relatively easy for him however feels that it will now take him approximately 5 to 10 minutes to recover which is different and new for him.  Initially he felt symptoms were secondary to more sedentary lifestyle given COVID-19 and working from home with possible weight gain however at last follow-up he had no change in weight per his report.  As above echocardiogram showed a slight decrease in LV function however was  relatively stable at last evaluation.  Per patient report, there was discussion regarding her stress test versus LHC given mildly decreased LV function however this was deferred at that time with the understanding if symptoms arose we would proceed with further ischemic evaluation.  Patient denies actual chest pain and states his prior MIs were associated with nausea and agitation so this is a  new symptom for him however concerning given his history with no overt heart failure symptoms.  He prefers to pursue cardiac catheterization as there is no utility in coronary CT scan and likely poor utility and stress test given his symptoms.  He was seen by his renal transplant team [facility] on Friday with a creatinine at 1.6.  Last creatinine in epic appears to be 1.4 02/02/2020.  He would like to run the situation by his nephrologist, Dr. Moshe Cipro with [facility] prior to cath if this is pursued.  The patient does not have symptoms concerning for COVID-19 infection (fever, chills, cough, or new shortness of breath).   Past Medical History:  Diagnosis Date  . AICD (automatic cardioverter/defibrillator) present    Dr. Paschal Dopp follows remotely-yrly checks, Dr. Jonne Ply  . Avascular necrosis of bone of hip ([place]) 2010 surg   Left hip arthroplasty: from chronic systemic steroids taken for his Lupus  . Barrett's esophagus 2017; 2021  . Blood transfusion without reported diagnosis 2010, 2012  . CAD (coronary artery disease)    **DAPT long term recommended by cards**.  a. stents RCA/Circ 2001 b. BMS to LAD 07/2010  c. 01/2017: s/p DES to RCA.   . Cataract    left eye  . CHF (congestive heart failure) ([place])   . Chronic headaches 02/2017   As of 01/2017, HA's no better, neurologist ordered CT.  ESR normal at that time.  HA'S COMPLETELY RESOLVED AFTER HE GOT ON CPAP 2019/2020.  [name] Chronic renal insufficiency, stage II (mild)    GFR 65 ml/min 07/19/15 at local renal f/u (Cr 1.29).  GFR 64 ml/min (cr 1.3) at local renal f/u 01/16/17.  . Erythrocytosis    Pre and post transplant->on losartan for this  . GERD (gastroesophageal reflux disease)    Hx of esoph stricture and dilatation.  +Barrett's esophagus+hx of aspiration pneumonia-->to get rpt EGD when he comes off DAPT (utd as of 01/29/18)  . Gout    s/p renal transplant he was weened off of his allopurinol.  . Hiatal hernia   .  History of adenomatous polyp of colon 09/2019   polyp x 1. Recall 5 yrs  . 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 History of renal transplant 11/10/2012   10/2012 [facility]   . Hyperlipidemia   . Hypertension   . implantable cardiac defibrillator single chamber    Medtronic (due to low EF)  . Ischemic cardiomyopathy    Chronic systolic dysfunction--  88/8280 EF 30-35%.   Single chamber ICD 11/20/10 ([name])  . Left ventricular thrombus 2012   Re-eval 02/2011 showed thrombus RESOLVED, so coumadin was d/c'd (was on it for 77mo  . [place]up[place]   . OSA on CPAP 2018   Dr. [name]9/2018: +OSA on home sleep studay; CPAP auto titrate 5-20 started 05/2017.  . Osteoporosis   . Post-transplant erythrocytosis    improved with ARB (valsartan)  . Prediabetes 12/2019   A1c stable long term at 5.6-5.7 until jump up to 6.2% Aug 2021.  Past Surgical History:  Procedure Laterality Date  . AV FISTULA PLACEMENT  03/28/2011   Procedure: ARTERIOVENOUS (AV) FISTULA CREATION;  Surgeon: Rosetta Posner, MD;  Location: [place];  Service: Vascular;  Laterality: Left;  Creation of Left radiocephallic cimino fistula  . AV FISTULA PLACEMENT  04/27/2011   Procedure: ARTERIOVENOUS (AV) FISTULA CREATION;  Surgeon: Rosetta Posner, MD;  Location: [place];  Service: Vascular;  Laterality: Left;  left basilic vein transposition  . BIOPSY  09/14/2019   Procedure: BIOPSY;  Surgeon: Jerene Bears, MD;  Location: [facility];  Service: Gastroenterology;;  . CARDIAC CATHETERIZATION     08/2010  . CARDIOVASCULAR STRESS TEST  07/2010; 03/2014   03/2014 showed large old infarct and EF 30-35% but no ischemia  . COLONOSCOPY  09/14/2019   Adenoma x 1.  Recall 5 yrs  . COLONOSCOPY WITH PROPOFOL N/A 09/14/2019   Procedure: COLONOSCOPY WITH PROPOFOL;  Surgeon: Jerene Bears, MD;  Location: [facility];  Service: Gastroenterology;  Laterality: N/A;  . DEXA  07/05/2017    Normal bone density in radius and spine.  . ESOPHAGOGASTRODUODENOSCOPY  02/2009   Done by Dr. Fuller Plan for hematemesis; esophagitis found.  Repeat recommended 09/2016, at which time he will also likely get his initial screening colonoscopy.  . ESOPHAGOGASTRODUODENOSCOPY  09/14/2019   esophagitis (improved compared to 2017 EGD), Barrett's.  Stomach and duodenum normal.  . ESOPHAGOGASTRODUODENOSCOPY (EGD) WITH PROPOFOL N/A 09/29/2015   REPEAT EGD RECOMMENDED 09/2016.  Barrett's esoph + mild chronic gastritis.  H pylori NEG. Procedure: ESOPHAGOGASTRODUODENOSCOPY (EGD) WITH PROPOFOL;  Surgeon: Jerene Bears, MD;  Location: [facility];  Service: Gastroenterology;  Laterality: N/A;  . ESOPHAGOGASTRODUODENOSCOPY (EGD) WITH PROPOFOL N/A 09/14/2019   Procedure: ESOPHAGOGASTRODUODENOSCOPY (EGD) WITH PROPOFOL;  Surgeon: Jerene Bears, MD;  Location: [facility];  Service: Gastroenterology;  Laterality: N/A;  . ICD GENERATOR CHANGEOUT N/A 04/11/2018   Procedure: ICD GENERATOR CHANGEOUT;  Surgeon: Deboraha Sprang, MD;  Location: [place] [facility];  Service: Cardiovascular;  Laterality: N/A;  . INSERT / REPLACE / REMOVE PACEMAKER     medtronic        dr [name]    [place]   icd only  . KIDNEY TRANSPLANT  10/2012   [facility] nephrologist--Dr. Blair Heys  . LEFT HEART CATH AND CORONARY ANGIOGRAPHY N/A 02/08/2017   PCA and DES to mRCA--needs DAPT for at least 6 mo.   Procedure: LEFT HEART CATH AND CORONARY ANGIOGRAPHY;  Surgeon: Sherren Mocha, MD;  Location: [place] [facility];  Service: Cardiovascular;  Laterality: N/A;  . PARTIAL HIP ARTHROPLASTY Left 2010   left  . POLYPECTOMY  09/14/2019   Procedure: POLYPECTOMY;  Surgeon: Jerene Bears, MD;  Location: [facility];  Service: Gastroenterology;;  . RENAL BIOPSY    . stents     05-2010 and 2- in 2010 and 1 in 2018.  [name] TOTAL HIP ARTHROPLASTY  07/09/2011   Procedure: TOTAL HIP ARTHROPLASTY;  Surgeon: Kerin Salen, MD;  Location: [place];  Service: Orthopedics;   Laterality: Right;  . TYMPANOSTOMY TUBE PLACEMENT  54 yrs old  . US ECHOCARDIOGRAPHY  12/2010; 02/2014;08/2014;08/2019   02/2014 EF still 25-30%, increased PA pressures.  2016 EF 30-35%, sept/inf hypokinesis, mod tricusp regurg. 08/2019 EF 25-30%, global hypok, sev dil LV w/apical aneur, grd III DD, mod tricus reg, mod pulm htn     Current Meds  Medication Sig  . allopurinol (ZYLOPRIM) 100 MG tablet Take 1 tablet (100 mg total) by mouth  daily.  . aspirin EC 81 MG tablet Take 81 mg by mouth daily.  [name] atorvastatin (LIPITOR) 80 MG tablet Take 80 mg by mouth at bedtime.  [name] BYSTOLIC 5 MG tablet TAKE 1 TABLET BY MOUTH DAILY (Patient taking differently: Take 5 mg by mouth daily. )  . clopidogrel (PLAVIX) 75 MG tablet TAKE 1 TABLET BY MOUTH DAILY (Patient taking differently: Take 75 mg by mouth daily. )  . DEXILANT 60 MG capsule TAKE 1 CAPSULE BY MOUTH DAILY BEFORE BREAKFAST (Patient taking differently: Take 60 mg by mouth daily. )  . famotidine (PEPCID) 20 MG tablet Take 40 mg by mouth daily.  . fluticasone (FLONASE) 50 MCG/ACT nasal spray Place 1 spray into both nostrils daily.   [name] losartan (COZAAR) 25 MG tablet Take 25 mg by mouth at bedtime.   . magnesium oxide (MAG-OX) 400 MG tablet Take 400 mg by mouth daily with lunch.   . mycophenolate (CELLCEPT) 250 MG capsule Take 3 capsules (750 mg total) by mouth 2 (two) times daily.  . niacin (NIASPAN) 1000 MG CR tablet Take 1,000 mg by mouth at bedtime.  . nitroGLYCERIN (NITROSTAT) 0.4 MG SL tablet Place 1 tablet (0.4 mg total) under the tongue every 5 (five) minutes as needed for chest pain (DO NOT EXCEED 3 DOSES).  . predniSONE (DELTASONE) 5 MG tablet TAKE 1 TABLET BY MOUTH DAILY (Patient taking differently: Take 5 mg by mouth daily with breakfast. )  . tacrolimus (PROGRAF) 0.5 MG capsule Take 0.5 mg by mouth every 12 (twelve) hours.   . traMADol (ULTRAM) 50 MG tablet Take 50 mg by mouth every 6 (six) hours as needed. for pain     Allergies:   Triptans    Social History   Tobacco Use  . Smoking status: Former Smoker    Packs/day: 0.50    Years: 30.00    Pack years: 15.00    Types: Cigarettes    Quit date: 08/02/2010    Years since quitting: 9.7  . Smokeless tobacco: Never Used  Vaping Use  . Vaping Use: Never used  Substance Use Topics  . Alcohol use: No    Alcohol/week: 0.0 standard drinks  . Drug use: No     Family Hx: The patient's family history includes Diabetes in his mother; Heart attack in his father and paternal grandfather; Heart attack (age of onset: 58) in his sister; Heart disease in his paternal aunt, paternal uncle, and sister; Hyperlipidemia in his father and sister; Hypertension in his father and sister; Kidney failure in his mother; Lupus in his mother; Stroke in his mother. There is no history of Colon cancer, Esophageal cancer, Stomach cancer, or Rectal cancer.  ROS:   Please see the history of present illness.     All other systems reviewed and are negative.  Prior CV studies:   The following studies were reviewed today:  LEFT HEART CATH AND CORONARY ANGIOGRAPHY 01/2017  Conclusion     Ost RCA to Prox RCA lesion, 0 %stenosed.  Dist RCA lesion, 75 %stenosed.  Prox LAD to Mid LAD lesion, 20 %stenosed.  Ost 1st Mrg to 1st Mrg lesion, 40 %stenosed.  A STENT SYNERGY DES 3X28 drug eluting stent was successfully placed.  Mid RCA lesion, 90 %stenosed.  Post intervention, there is a 0% residual stenosis.  1. Severe calcific RCA stenosis treated successfully with PCI (implantation of a 3.0x28 mm Synergy DES) 2. Patent LAD stent with mild in-stent restenosis 3. Patent LCx stent with mild in-stent restenosis  Recommend: continue DAPT long-term as tolerated (minimum 6 months without interruption)    Echocardiogram 04/23/2016 Study Conclusions  - Left ventricle: The cavity size was moderately dilated. Wall thickness was increased in a pattern of mild LVH. The estimated ejection fraction  was 30%. Left ventricular diastolic function parameters were normal. - Left atrium: The atrium was severely dilated. - Atrial septum: No defect or patent foramen ovale was identified. - Pulmonary arteries: PA peak pressure: 55 mm Hg (S).  Labs/Other Tests and Data Reviewed:    EKG:    Recent Labs: 10/07/2019: ALT 16 01/06/2020: TSH 2.04 02/02/2020: BUN 15; Creatinine 1.4; Hemoglobin 17.2; Platelets 201; Potassium 4.9; Sodium 142   Recent Lipid Panel Lab Results  Component Value Date/Time   CHOL 110 10/07/2019 12:00 AM   TRIG 91 10/07/2019 12:00 AM   HDL 62 10/07/2019 12:00 AM   CHOLHDL 2 07/15/2018 09:17 AM   LDLCALC 31 10/07/2019 12:00 AM    Wt Readings from Last 3 Encounters:  04/18/20 228 lb (103.4 kg)  02/01/20 230 lb 12.8 oz (104.7 kg)  01/06/20 237 lb 12.8 oz (107.9 kg)     Objective:    Vital Signs:  BP 116/61   Pulse 68   Ht _0  (1.702 m)   Wt 228 lb (103.4 kg)   BMI 35.71 kg/m    VITAL SIGNS:  reviewed GEN:  no acute distress NEURO:  alert and oriented x 3, no obvious focal deficit  ASSESSMENT & PLAN:    1.  CAD with prior MI x2 and ischemic cardiomyopathy with new DOE: -Denies chest pain symptoms however has been having approximately 2 to 3-week history of DOE which is concerning given his history.  Recent echocardiogram with mild decrease in LV function however in follow-up had no symptoms therefore no further evaluation was pursued.  Given new DOE symptoms and intermittent chest pressure discussed plan for repeat cardiac catheterization however will need to be mindful of prior renal transplant with a baseline creatinine at 1.4-1.6.  Patient would like [place] to reach out to Dr. Moshe Cipro, his nephrologist prior to cath set up.  We will send a message to primary cardiologist, Dr. Johnsie Cancel as well for his review. -Continue ASA, Plavix, atorvastatin, losartan  2.  CKD with prior renal transplant: -Follows with nephrology, Dr. Clover Mealy -Creatinine, 1.6 on  04/16/2020 per patient report at renal transplant office at [facility] which appears to be around his more recent baseline -Continue prednisone, Prograf and CellCept  3.  HLD: -Last LDL, 40 on 07/17/2018 -Continue statin, niacin therapy  4.  GERD: -Has known hiatal hernia>> continue with current regimen -Follows with GI  Shared Decision Making/Informed Consent      COVID-19 Education: The signs and symptoms of COVID-19 were discussed with the patient and how to seek care for testing (follow up with PCP or arrange E-visit).  The importance of social distancing was discussed today.  Time:   Today, I have spent 20 minutes with the patient with telehealth technology discussing the above problems.   Medication Adjustments/Labs and Tests Ordered: Current medicines are reviewed at length with the patient today.  Concerns regarding medicines are outlined above.   Tests Ordered: No orders of the defined types were placed in this encounter.   Medication Changes: No orders of the defined types were placed in this encounter.   Follow Up: In person or virtual with myself or Dr. Johnsie Cancel in 3 weeks  Signed, Kathyrn Drown, NP  04/18/2020 10:09 AM  [facility]

## 2020-04-17 NOTE — Progress Notes (Unsigned)
Virtual Visit via Telephone Note   This visit type was conducted due to national recommendations for restrictions regarding the COVID-19 Pandemic (e.g. social distancing) in an effort to limit this patient's exposure and mitigate transmission in our community.  Due to his co-morbid illnesses, this patient is at least at moderate risk for complications without adequate follow up.  This format is felt to be most appropriate for this patient at this time.  The patient did not have access to video technology/had technical difficulties with video requiring transitioning to audio format only (telephone).  All issues noted in this document were discussed and addressed.  No physical exam could be performed with this format.  Please refer to the patient's chart for his  consent to telehealth for [facility].    Date:  04/18/2020   ID:  Roy Dyer, DOB 1966-04-07, MRN [id number] The patient was identified using 2 identifiers.  Patient Location: Home Provider Location: Home Office  PCP:  Roy Sou, MD  Cardiologist:  Roy Rouge, MD  Electrophysiologist:  None   Evaluation Performed:  Follow-Up Visit  Chief Complaint:  SOB  History of Present Illness:    Roy Dyer is a 55 y.o. male with a history of CAD s/p MI in 2001 with remote stenting to the RCA and circumflex and BMS to LAD in 2012. Last cardiac catheterization 02/08/2017 for angina which showed 90% lesion in the mid RCA in which DES was placed, LAD and LCx stents were widely patent. Echocardiogram 04/2016 with LVEF 30% s/p AICD placement per Roy Dyer. Also with previous mural apical thrombus who is no longer on anticoagulation, post renal transplant at [facility] in 2012 (donor), history of lupus, OSA on CPAP (followed by Roy Dyer), chronic renal insufficiency and Barrett's, seen for Roy Dyer.   He was seen by Roy Dyer on 07/14/2018 for follow-up of ICD implantation for primary prevention of ischemic cardiomyopathy. Defibrillator  was interrogated personally by Roy Dyer at that time with no changes in programming.  Last interrogation 12/23/2018 with normal device function.   He was then seen by myself on 02/02/2019 in follow-up and was doing well from a CV standpoint. Given duration of time since last echocardiogram in 2017 we discussed repeating this. Also discussed his Plavix therapy. Per cath report, in 2018 he had in-stent restenosis and therefore likely the reason his Plavix has not been stopped.   He was then seen in follow-up 08/18/2019 with Roy Dyer at which time he had no signs of ischemia however did have ongoing episodes of nonsustained ventricular tachycardia.  No medication changes were made.  He underwent an echocardiogram 08/19/2019 that showed an LVEF of 25 to 30% with severe LAE with mild MR and moderate TR. He was last seen by Roy Dyer 10/16/2019 follow-up with no anginal symptoms  Last device interrogation 03/23/2020 with normal device functioning reviewed by Roy Dyer.  Today Roy Dyer states that for the last 2 to 3 weeks he has been noticing increased shortness of breath.  Previously a set of 10 stairs would be relatively easy for him however feels that it will now take him approximately 5 to 10 minutes to recover which is different and new for him.  Initially he felt symptoms were secondary to more sedentary lifestyle given COVID-19 and working from home with possible weight gain however at last follow-up he had no change in weight per his report.  As above echocardiogram showed a slight decrease in LV function however was  relatively stable at last evaluation.  Per patient report, there was discussion regarding her stress test versus LHC given mildly decreased LV function however this was deferred at that time with the understanding if symptoms arose we would proceed with further ischemic evaluation.  Patient denies actual chest pain and states his prior MIs were associated with nausea and agitation so this is a  new symptom for him however concerning given his history with no overt heart failure symptoms.  He prefers to pursue cardiac catheterization as there is no utility in coronary CT scan and likely poor utility and stress test given his symptoms.  He was seen by his renal transplant team at [facility] on Friday with a creatinine at 1.6.  Last creatinine in epic appears to be 1.4 02/02/2020.  He would like to run the situation by his nephrologist, Roy Dyer with [facility] prior to cath if this is pursued.  The patient does not have symptoms concerning for COVID-19 infection (fever, chills, cough, or new shortness of breath).   Past Medical History:  Diagnosis Date  . AICD (automatic cardioverter/defibrillator) present    Roy Dyer follows remotely-yrly checks, Roy Dyer  . Avascular necrosis of bone of hip ([place]) 2010 surg   Left hip arthroplasty: from chronic systemic steroids taken for his Lupus  . Barrett's esophagus 2017; 2021  . Blood transfusion without reported diagnosis 2010, 2012  . CAD (coronary artery disease)    **DAPT long term recommended by cards**.  a. stents RCA/Circ 2001 b. BMS to LAD 07/2010  c. 01/2017: s/p DES to RCA.   . Cataract    left eye  . CHF (congestive heart failure) ([place])   . Chronic headaches 02/2017   As of 01/2017, HA's no better, neurologist ordered CT.  ESR normal at that time.  HA'S COMPLETELY RESOLVED AFTER HE GOT ON CPAP 2019/2020.  [name] Chronic renal insufficiency, stage II (mild)    GFR 65 ml/min 07/19/15 at local renal f/u (Cr 1.29).  GFR 64 ml/min (cr 1.3) at local renal f/u 01/16/17.  . Erythrocytosis    Pre and post transplant->on losartan for this  . GERD (gastroesophageal reflux disease)    Hx of esoph stricture and dilatation.  +Barrett's esophagus+hx of aspiration pneumonia-->to get rpt EGD when he Dyer off DAPT (utd as of 01/29/18)  . Gout    s/p renal transplant he was weened off of his allopurinol.  . Hiatal hernia   .  History of adenomatous polyp of colon 09/2019   polyp x 1. Recall 5 yrs  . 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 History of renal transplant 11/10/2012   10/2012 [facility]   . Hyperlipidemia   . Hypertension   . implantable cardiac defibrillator single chamber    Medtronic (due to low EF)  . Ischemic cardiomyopathy    Chronic systolic dysfunction--  88/8280 EF 30-35%.   Single chamber ICD 11/20/10 (Roy Dyer)  . Left ventricular thrombus 2012   Re-eval 02/2011 showed thrombus RESOLVED, so coumadin was d/c'd (was on it for 77mo  . [place]upus ([place]   . OSA on CPAP 2018   Dr. [name]9/2018: +OSA on home sleep studay; CPAP auto titrate 5-20 started 05/2017.  . Osteoporosis   . Post-transplant erythrocytosis    improved with ARB (valsartan)  . Prediabetes 12/2019   A1c stable long term at 5.6-5.7 until jump up to 6.2% Aug 2021.  Past Surgical History:  Procedure Laterality Date  . AV FISTULA PLACEMENT  03/28/2011   Procedure: ARTERIOVENOUS (AV) FISTULA CREATION;  Surgeon: Rosetta Posner, MD;  Location: [place];  Service: Vascular;  Laterality: Left;  Creation of Left radiocephallic cimino fistula  . AV FISTULA PLACEMENT  04/27/2011   Procedure: ARTERIOVENOUS (AV) FISTULA CREATION;  Surgeon: Rosetta Posner, MD;  Location: [place];  Service: Vascular;  Laterality: Left;  left basilic vein transposition  . BIOPSY  09/14/2019   Procedure: BIOPSY;  Surgeon: Jerene Bears, MD;  Location: [facility];  Service: Gastroenterology;;  . CARDIAC CATHETERIZATION     08/2010  . CARDIOVASCULAR STRESS TEST  07/2010; 03/2014   03/2014 showed large old infarct and EF 30-35% but no ischemia  . COLONOSCOPY  09/14/2019   Adenoma x 1.  Recall 5 yrs  . COLONOSCOPY WITH PROPOFOL N/A 09/14/2019   Procedure: COLONOSCOPY WITH PROPOFOL;  Surgeon: Jerene Bears, MD;  Location: [facility];  Service: Gastroenterology;  Laterality: N/A;  . DEXA  07/05/2017    Normal bone density in radius and spine.  . ESOPHAGOGASTRODUODENOSCOPY  02/2009   Done by Dr. Fuller Plan for hematemesis; esophagitis found.  Repeat recommended 09/2016, at which time he will also likely get his initial screening colonoscopy.  . ESOPHAGOGASTRODUODENOSCOPY  09/14/2019   esophagitis (improved compared to 2017 EGD), Barrett's.  Stomach and duodenum normal.  . ESOPHAGOGASTRODUODENOSCOPY (EGD) WITH PROPOFOL N/A 09/29/2015   REPEAT EGD RECOMMENDED 09/2016.  Barrett's esoph + mild chronic gastritis.  H pylori NEG. Procedure: ESOPHAGOGASTRODUODENOSCOPY (EGD) WITH PROPOFOL;  Surgeon: Jerene Bears, MD;  Location: [facility];  Service: Gastroenterology;  Laterality: N/A;  . ESOPHAGOGASTRODUODENOSCOPY (EGD) WITH PROPOFOL N/A 09/14/2019   Procedure: ESOPHAGOGASTRODUODENOSCOPY (EGD) WITH PROPOFOL;  Surgeon: Jerene Bears, MD;  Location: [facility];  Service: Gastroenterology;  Laterality: N/A;  . ICD GENERATOR CHANGEOUT N/A 04/11/2018   Procedure: ICD GENERATOR CHANGEOUT;  Surgeon: Deboraha Sprang, MD;  Location: [place] [facility];  Service: Cardiovascular;  Laterality: N/A;  . INSERT / REPLACE / REMOVE PACEMAKER     medtronic        dr [name]    [place]   icd only  . KIDNEY TRANSPLANT  10/2012   [facility] nephrologist--Dr. Blair Heys  . LEFT HEART CATH AND CORONARY ANGIOGRAPHY N/A 02/08/2017   PCA and DES to mRCA--needs DAPT for at least 6 mo.   Procedure: LEFT HEART CATH AND CORONARY ANGIOGRAPHY;  Surgeon: Sherren Mocha, MD;  Location: [place] [facility];  Service: Cardiovascular;  Laterality: N/A;  . PARTIAL HIP ARTHROPLASTY Left 2010   left  . POLYPECTOMY  09/14/2019   Procedure: POLYPECTOMY;  Surgeon: Jerene Bears, MD;  Location: [facility];  Service: Gastroenterology;;  . RENAL BIOPSY    . stents     05-2010 and 2- in 2010 and 1 in 2018.  [name] TOTAL HIP ARTHROPLASTY  07/09/2011   Procedure: TOTAL HIP ARTHROPLASTY;  Surgeon: Kerin Salen, MD;  Location: [place];  Service: Orthopedics;   Laterality: Right;  . TYMPANOSTOMY TUBE PLACEMENT  54 yrs old  . US ECHOCARDIOGRAPHY  12/2010; 02/2014;08/2014;08/2019   02/2014 EF still 25-30%, increased PA pressures.  2016 EF 30-35%, sept/inf hypokinesis, mod tricusp regurg. 08/2019 EF 25-30%, global hypok, sev dil LV w/apical aneur, grd III DD, mod tricus reg, mod pulm htn     Current Meds  Medication Sig  . allopurinol (ZYLOPRIM) 100 MG tablet Take 1 tablet (100 mg total) by mouth  daily.  . aspirin EC 81 MG tablet Take 81 mg by mouth daily.  [name] atorvastatin (LIPITOR) 80 MG tablet Take 80 mg by mouth at bedtime.  [name] BYSTOLIC 5 MG tablet TAKE 1 TABLET BY MOUTH DAILY (Patient taking differently: Take 5 mg by mouth daily. )  . clopidogrel (PLAVIX) 75 MG tablet TAKE 1 TABLET BY MOUTH DAILY (Patient taking differently: Take 75 mg by mouth daily. )  . DEXILANT 60 MG capsule TAKE 1 CAPSULE BY MOUTH DAILY BEFORE BREAKFAST (Patient taking differently: Take 60 mg by mouth daily. )  . famotidine (PEPCID) 20 MG tablet Take 40 mg by mouth daily.  . fluticasone (FLONASE) 50 MCG/ACT nasal spray Place 1 spray into both nostrils daily.   [name] losartan (COZAAR) 25 MG tablet Take 25 mg by mouth at bedtime.   . magnesium oxide (MAG-OX) 400 MG tablet Take 400 mg by mouth daily with lunch.   . mycophenolate (CELLCEPT) 250 MG capsule Take 3 capsules (750 mg total) by mouth 2 (two) times daily.  . niacin (NIASPAN) 1000 MG CR tablet Take 1,000 mg by mouth at bedtime.  . nitroGLYCERIN (NITROSTAT) 0.4 MG SL tablet Place 1 tablet (0.4 mg total) under the tongue every 5 (five) minutes as needed for chest pain (DO NOT EXCEED 3 DOSES).  . predniSONE (DELTASONE) 5 MG tablet TAKE 1 TABLET BY MOUTH DAILY (Patient taking differently: Take 5 mg by mouth daily with breakfast. )  . tacrolimus (PROGRAF) 0.5 MG capsule Take 0.5 mg by mouth every 12 (twelve) hours.   . traMADol (ULTRAM) 50 MG tablet Take 50 mg by mouth every 6 (six) hours as needed. for pain     Allergies:   Triptans    Social History   Tobacco Use  . Smoking status: Former Smoker    Packs/day: 0.50    Years: 30.00    Pack years: 15.00    Types: Cigarettes    Quit date: 08/02/2010    Years since quitting: 9.7  . Smokeless tobacco: Never Used  Vaping Use  . Vaping Use: Never used  Substance Use Topics  . Alcohol use: No    Alcohol/week: 0.0 standard drinks  . Drug use: No     Family Hx: The patient's family history includes Diabetes in his mother; Heart attack in his father and paternal grandfather; Heart attack (age of onset: 58) in his sister; Heart disease in his paternal aunt, paternal uncle, and sister; Hyperlipidemia in his father and sister; Hypertension in his father and sister; Kidney failure in his mother; Lupus in his mother; Stroke in his mother. There is no history of Colon cancer, Esophageal cancer, Stomach cancer, or Rectal cancer.  ROS:   Please see the history of present illness.     All other systems reviewed and are negative.  Prior CV studies:   The following studies were reviewed today:  LEFT HEART CATH AND CORONARY ANGIOGRAPHY 01/2017  Conclusion     Ost RCA to Prox RCA lesion, 0 %stenosed.  Dist RCA lesion, 75 %stenosed.  Prox LAD to Mid LAD lesion, 20 %stenosed.  Ost 1st Mrg to 1st Mrg lesion, 40 %stenosed.  A STENT SYNERGY DES 3X28 drug eluting stent was successfully placed.  Mid RCA lesion, 90 %stenosed.  Post intervention, there is a 0% residual stenosis.  1. Severe calcific RCA stenosis treated successfully with PCI (implantation of a 3.0x28 mm Synergy DES) 2. Patent LAD stent with mild in-stent restenosis 3. Patent LCx stent with mild in-stent restenosis  Recommend: continue DAPT long-term as tolerated (minimum 6 months without interruption)    Echocardiogram 04/23/2016 Study Conclusions  - Left ventricle: The cavity size was moderately dilated. Wall thickness was increased in a pattern of mild LVH. The estimated ejection fraction  was 30%. Left ventricular diastolic function parameters were normal. - Left atrium: The atrium was severely dilated. - Atrial septum: No defect or patent foramen ovale was identified. - Pulmonary arteries: PA peak pressure: 55 mm Hg (S).  Labs/Other Tests and Data Reviewed:    EKG:    Recent Labs: 10/07/2019: ALT 16 01/06/2020: TSH 2.04 02/02/2020: BUN 15; Creatinine 1.4; Hemoglobin 17.2; Platelets 201; Potassium 4.9; Sodium 142   Recent Lipid Panel Lab Results  Component Value Date/Time   CHOL 110 10/07/2019 12:00 AM   TRIG 91 10/07/2019 12:00 AM   HDL 62 10/07/2019 12:00 AM   CHOLHDL 2 07/15/2018 09:17 AM   LDLCALC 31 10/07/2019 12:00 AM    Wt Readings from Last 3 Encounters:  04/18/20 228 lb (103.4 kg)  02/01/20 230 lb 12.8 oz (104.7 kg)  01/06/20 237 lb 12.8 oz (107.9 kg)     Objective:    Vital Signs:  BP 116/61   Pulse 68   Ht _0  (1.702 m)   Wt 228 lb (103.4 kg)   BMI 35.71 kg/m    VITAL SIGNS:  reviewed GEN:  no acute distress NEURO:  alert and oriented x 3, no obvious focal deficit  ASSESSMENT & PLAN:    1.  CAD with prior MI x2 and ischemic cardiomyopathy with new DOE: -Denies chest pain symptoms however has been having approximately 2 to 3-week history of DOE which is concerning given his history.  Recent echocardiogram with mild decrease in LV function however in follow-up had no symptoms therefore no further evaluation was pursued.  Given new DOE symptoms and intermittent chest pressure discussed plan for repeat cardiac catheterization however will need to be mindful of prior renal transplant with a baseline creatinine at 1.4-1.6.  Patient would like [place] to reach out to Roy Dyer, his nephrologist prior to cath set up.  We will send a message to primary cardiologist, Roy Dyer as well for his review. -Continue ASA, Plavix, atorvastatin, losartan  2.  CKD with prior renal transplant: -Follows with nephrology, Dr. Clover Mealy -Creatinine, 1.6 on  04/16/2020 per patient report at renal transplant office at [facility] which appears to be around his more recent baseline -Continue prednisone, Prograf and CellCept  3.  HLD: -Last LDL, 40 on 07/17/2018 -Continue statin, niacin therapy  4.  GERD: -Has known hiatal hernia>> continue with current regimen -Follows with GI  Shared Decision Making/Informed Consent      COVID-19 Education: The signs and symptoms of COVID-19 were discussed with the patient and how to seek care for testing (follow up with PCP or arrange E-visit).  The importance of social distancing was discussed today.  Time:   Today, I have spent 20 minutes with the patient with telehealth technology discussing the above problems.   Medication Adjustments/Labs and Tests Ordered: Current medicines are reviewed at length with the patient today.  Concerns regarding medicines are outlined above.   Tests Ordered: No orders of the defined types were placed in this encounter.   Medication Changes: No orders of the defined types were placed in this encounter.   Follow Up: In person or virtual with myself or Roy Dyer in 3 weeks  Signed, Kathyrn Drown, NP  04/18/2020 10:09 AM  [facility]

## 2020-04-18 ENCOUNTER — Other Ambulatory Visit: Payer: Self-pay

## 2020-04-18 ENCOUNTER — Telehealth (INDEPENDENT_AMBULATORY_CARE_PROVIDER_SITE_OTHER): Payer: Managed Care, Other (non HMO) | Admitting: Cardiology

## 2020-04-18 ENCOUNTER — Encounter: Payer: Self-pay | Admitting: Cardiology

## 2020-04-18 VITALS — BP 116/61 | HR 68 | Ht 67.0 in | Wt 228.0 lb

## 2020-04-18 DIAGNOSIS — I5022 Chronic systolic (congestive) heart failure: Secondary | ICD-10-CM | POA: Diagnosis not present

## 2020-04-18 DIAGNOSIS — R0609 Other forms of dyspnea: Secondary | ICD-10-CM

## 2020-04-18 DIAGNOSIS — I10 Essential (primary) hypertension: Secondary | ICD-10-CM

## 2020-04-18 DIAGNOSIS — R0600 Dyspnea, unspecified: Secondary | ICD-10-CM

## 2020-04-18 DIAGNOSIS — I472 Ventricular tachycardia: Secondary | ICD-10-CM

## 2020-04-18 DIAGNOSIS — Z940 Kidney transplant status: Secondary | ICD-10-CM

## 2020-04-18 DIAGNOSIS — I4729 Other ventricular tachycardia: Secondary | ICD-10-CM

## 2020-04-18 DIAGNOSIS — I255 Ischemic cardiomyopathy: Secondary | ICD-10-CM

## 2020-04-18 DIAGNOSIS — I2510 Atherosclerotic heart disease of native coronary artery without angina pectoris: Secondary | ICD-10-CM

## 2020-04-18 DIAGNOSIS — E782 Mixed hyperlipidemia: Secondary | ICD-10-CM

## 2020-04-18 NOTE — Patient Instructions (Addendum)
Medication Instructions:  Your physician recommends that you continue on your current medications as directed. Please refer to the Current Medication list given to you today.  *If you need a refill on your cardiac medications before your next appointment, please call your pharmacy*   Lab Work: TO BE DONE TOMORROW 04/19/20: BMET,CBC If you have labs (blood work) drawn today and your tests are completely normal, you will receive your results only by: [name] MyChart Message (if you have MyChart) OR . A paper copy in the mail If you have any lab test that is abnormal or we need to change your treatment, we will call you to review the results.   Testing/Procedures: Your physician has requested that you have a cardiac catheterization. Cardiac catheterization is used to diagnose and/or treat various heart conditions. Doctors may recommend this procedure for a number of different reasons. The most common reason is to evaluate chest pain. Chest pain can be a symptom of coronary artery disease (CAD), and cardiac catheterization can show whether plaque is narrowing or blocking your heart's arteries. This procedure is also used to evaluate the valves, as well as measure the blood flow and oxygen levels in different parts of your heart. For further information please visit [email or web address].[email or web address]. Please follow instruction sheet, as given.  Follow-Up: At [facility], you and your health needs are our priority.  As part of our continuing mission to provide you with exceptional heart care, we have created designated Provider Care Teams.  These Care Teams include your primary Cardiologist (physician) and Advanced Practice Providers (APPs -  Physician Assistants and Nurse Practitioners) who all work together to provide you with the care you need, when you need it.  We recommend signing up for the patient portal called "MyChart".  Sign up information is provided on this After Visit Summary.  MyChart is used to connect  with patients for Virtual Visits (Telemedicine).  Patients are able to view lab/test results, encounter notes, upcoming appointments, etc.  Non-urgent messages can be sent to your provider as well.   To learn more about what you can do with MyChart, go to [email or web address].    Your next appointment:   3 week(s)  The format for your next appointment:   In Person  Provider:   You may see [name], MD or one of the following Advanced Practice Providers on your designated Care Team:    Truitt Merle, NP  Cecilie Kicks, NP  Kathyrn Drown, NP    Other Instructions     [place] OFFICE [place], [place] [address] Dept: [phone] Loc: [place]  04/18/2020  You are scheduled for a Cardiac Catheterization on Friday, December 10 with Dr. Sherren Mocha.  1. Please arrive at the [facility] (Main Entrance A) at [facility]: [address] [place], [place] [address] at 8:00 AM (This time is two hours before your procedure to ensure your preparation). Free valet parking service is available.   Special note: Every effort is made to have your procedure done on time. Please understand that emergencies sometimes delay scheduled procedures.  2. Diet: Do not eat solid foods after midnight.  The patient may have clear liquids until 5am upon the day of the procedure.  3. Labs: You will need to have blood drawn on Tuesday, December 7 at [facility]. [address], [place]  Open: 7:30am -  5pm    Phone: [phone]. You do not need to be fasting.  4. Medication instructions in preparation for your procedure:   Contrast Allergy: No   On the morning of your procedure, take your Aspirin/PLAVIX and any morning medicines NOT listed above.  You may use sips of water.  5. Plan for one night stay--bring personal belongings. 6.  Bring a current list of your medications and current insurance cards. 7. You MUST have a responsible person to drive you home. 8. Someone MUST be with you the first 24 hours after you arrive home or your discharge will be delayed. 9. Please wear clothes that are easy to get on and off and wear slip-on shoes.  Thank you for allowing [place] to care for you!   -- [facility]

## 2020-04-18 NOTE — Addendum Note (Signed)
Addended by: Jacinta Shoe on: 04/18/2020 02:19 PM   Modules accepted: Orders

## 2020-04-19 ENCOUNTER — Other Ambulatory Visit: Payer: Managed Care, Other (non HMO)

## 2020-04-19 ENCOUNTER — Telehealth: Payer: Self-pay | Admitting: Cardiology

## 2020-04-19 ENCOUNTER — Other Ambulatory Visit (HOSPITAL_COMMUNITY)
Admission: RE | Admit: 2020-04-19 | Discharge: 2020-04-19 | Disposition: A | Discharge status: Home / Self Care | Payer: Managed Care, Other (non HMO) | Source: Ambulatory Visit | Attending: Cardiovascular Disease | Admitting: Cardiovascular Disease

## 2020-04-19 DIAGNOSIS — Z01812 Encounter for preprocedural laboratory examination: Secondary | ICD-10-CM | POA: Diagnosis present

## 2020-04-19 DIAGNOSIS — I2510 Atherosclerotic heart disease of native coronary artery without angina pectoris: Secondary | ICD-10-CM

## 2020-04-19 DIAGNOSIS — Z20822 Contact with and (suspected) exposure to covid-19: Secondary | ICD-10-CM | POA: Diagnosis not present

## 2020-04-19 DIAGNOSIS — I5022 Chronic systolic (congestive) heart failure: Secondary | ICD-10-CM

## 2020-04-19 DIAGNOSIS — I10 Essential (primary) hypertension: Secondary | ICD-10-CM

## 2020-04-19 LAB — CBC
Hematocrit: 48.7 % (ref 37.5–51.0)
Hemoglobin: 16.8 g/dL (ref 13.0–17.7)
MCH: 30.4 pg (ref 26.6–33.0)
MCHC: 34.5 g/dL (ref 31.5–35.7)
MCV: 88 fL (ref 79–97)
Platelets: 217 10*3/uL (ref 150–450)
RBC: 5.53 x10E6/uL (ref 4.14–5.80)
RDW: 12.6 % (ref 11.6–15.4)
WBC: 11.7 10*3/uL — ABNORMAL HIGH (ref 3.4–10.8)

## 2020-04-19 LAB — SARS CORONAVIRUS 2 (TAT 6-24 HRS): SARS Coronavirus 2: NEGATIVE

## 2020-04-19 NOTE — Telephone Encounter (Signed)
Patient states he tried to get his lab work done today at the office, but could not find a parking spot. He would like to know if the lab orders can be sent to the [facility] on [address] in [place] since it is closer to him.

## 2020-04-19 NOTE — Telephone Encounter (Signed)
Spoke with pt and made him aware that I have placed new orders so that he can go to any [facility].  Pt appreciative for call.

## 2020-04-20 LAB — BASIC METABOLIC PANEL
BUN/Creatinine Ratio: 12 (ref 9–20)
BUN: 15 mg/dL (ref 6–24)
CO2: 24 mmol/L (ref 20–29)
Calcium: 9.4 mg/dL (ref 8.7–10.2)
Chloride: 103 mmol/L (ref 96–106)
Creatinine, Ser: 1.26 mg/dL (ref 0.76–1.27)
GFR calc Af Amer: 75 mL/min/{1.73_m2} (ref >59)
GFR calc non Af Amer: 65 mL/min/{1.73_m2} (ref >59)
Glucose: 166 mg/dL — ABNORMAL HIGH (ref 65–99)
Potassium: 4.8 mmol/L (ref 3.5–5.2)
Sodium: 143 mmol/L (ref 134–144)

## 2020-04-21 ENCOUNTER — Telehealth: Payer: Self-pay | Admitting: *Deleted

## 2020-04-21 NOTE — Telephone Encounter (Signed)
Pt contacted pre-catheterization scheduled at [facility] for: Friday April 22, 2020 1 PM Verified arrival time and place: [name]) at: 8 AM-pre-procedure hydration Per Dr Nicola Girt rate for hydration 80 cc/hr  No solid food after midnight prior to cath, clear liquids until 5 AM day of procedure.  AM meds can be  taken pre-cath with sips of water including: ASA 81 mg Plavix 75 mg  Confirmed patient has responsible adult to drive home post procedure and be with patient first 24 hours after arriving home: yes  You are allowed ONE visitor in the waiting room during the time you are at the hospital for your procedure. Both you and your visitor must wear a mask once you enter the hospital.       COVID-19 Pre-Screening Questions:  . In the past 14 days have you had any symptoms concerning for COVID-19 infection (fever, chills, cough, or new shortness of breath)? no             In the past 14 days have you been around anyone with known Covid 19? no                    Reviewed procedure/mask/visitor instructions, COVID-19 questions with patients.                                   Per Dr Nicola Girt rate for hydration 80 cc/hr

## 2020-04-22 ENCOUNTER — Ambulatory Visit (HOSPITAL_COMMUNITY)
Admission: RE | Admit: 2020-04-22 | Discharge: 2020-04-22 | Disposition: A | Discharge status: Home / Self Care | Payer: Managed Care, Other (non HMO) | Attending: Cardiovascular Disease | Admitting: Cardiovascular Disease

## 2020-04-22 ENCOUNTER — Encounter (HOSPITAL_COMMUNITY)
Admission: RE | Disposition: A | Discharge status: Home / Self Care | Payer: Self-pay | Source: Home / Self Care | Attending: Cardiovascular Disease

## 2020-04-22 ENCOUNTER — Other Ambulatory Visit: Payer: Self-pay

## 2020-04-22 DIAGNOSIS — Z940 Kidney transplant status: Secondary | ICD-10-CM | POA: Diagnosis not present

## 2020-04-22 DIAGNOSIS — I25119 Atherosclerotic heart disease of native coronary artery with unspecified angina pectoris: Secondary | ICD-10-CM

## 2020-04-22 DIAGNOSIS — E785 Hyperlipidemia, unspecified: Secondary | ICD-10-CM | POA: Insufficient documentation

## 2020-04-22 DIAGNOSIS — Z79899 Other long term (current) drug therapy: Secondary | ICD-10-CM | POA: Insufficient documentation

## 2020-04-22 DIAGNOSIS — I493 Ventricular premature depolarization: Secondary | ICD-10-CM | POA: Diagnosis not present

## 2020-04-22 DIAGNOSIS — R0609 Other forms of dyspnea: Secondary | ICD-10-CM | POA: Insufficient documentation

## 2020-04-22 DIAGNOSIS — Z955 Presence of coronary angioplasty implant and graft: Secondary | ICD-10-CM | POA: Diagnosis not present

## 2020-04-22 DIAGNOSIS — I255 Ischemic cardiomyopathy: Secondary | ICD-10-CM | POA: Diagnosis not present

## 2020-04-22 DIAGNOSIS — K219 Gastro-esophageal reflux disease without esophagitis: Secondary | ICD-10-CM | POA: Insufficient documentation

## 2020-04-22 DIAGNOSIS — I209 Angina pectoris, unspecified: Secondary | ICD-10-CM

## 2020-04-22 DIAGNOSIS — Z7982 Long term (current) use of aspirin: Secondary | ICD-10-CM | POA: Insufficient documentation

## 2020-04-22 DIAGNOSIS — Z7902 Long term (current) use of antithrombotics/antiplatelets: Secondary | ICD-10-CM | POA: Diagnosis not present

## 2020-04-22 DIAGNOSIS — Z87891 Personal history of nicotine dependence: Secondary | ICD-10-CM | POA: Insufficient documentation

## 2020-04-22 DIAGNOSIS — N189 Chronic kidney disease, unspecified: Secondary | ICD-10-CM | POA: Insufficient documentation

## 2020-04-22 HISTORY — PX: LEFT HEART CATH AND CORONARY ANGIOGRAPHY: CATH118249

## 2020-04-22 HISTORY — PX: CORONARY PRESSURE/FFR WITH 3D MAPPING: CATH118309

## 2020-04-22 HISTORY — PX: CORONARY PRESSURE WIRE/FFR WITH 3D MAPPING: CATH118309

## 2020-04-22 LAB — POCT ACTIVATED CLOTTING TIME: Activated Clotting Time: 339 seconds

## 2020-04-22 SURGERY — LEFT HEART CATH AND CORONARY ANGIOGRAPHY
Anesthesia: LOCAL

## 2020-04-22 MED ORDER — LIDOCAINE HCL (PF) 1 % IJ SOLN
INTRAMUSCULAR | Status: AC
  Filled 2020-04-22: qty 30

## 2020-04-22 MED ORDER — IOHEXOL 350 MG/ML SOLN
INTRAVENOUS | Status: DC | PRN
  Administered 2020-04-22: 60 mL

## 2020-04-22 MED ORDER — VERAPAMIL HCL 2.5 MG/ML IV SOLN
INTRAVENOUS | Status: DC | PRN
  Administered 2020-04-22: 10 mL via INTRA_ARTERIAL

## 2020-04-22 MED ORDER — VERAPAMIL HCL 2.5 MG/ML IV SOLN
INTRAVENOUS | Status: AC
  Filled 2020-04-22: qty 2

## 2020-04-22 MED ORDER — HEPARIN (PORCINE) IN NACL 1000-0.9 UT/500ML-% IV SOLN
INTRAVENOUS | Status: DC | PRN
  Administered 2020-04-22: 500 mL

## 2020-04-22 MED ORDER — HEPARIN SODIUM (PORCINE) 1000 UNIT/ML IJ SOLN
INTRAMUSCULAR | Status: DC | PRN
  Administered 2020-04-22: 5000 [IU] via INTRAVENOUS

## 2020-04-22 MED ORDER — MIDAZOLAM HCL 2 MG/2ML IJ SOLN
INTRAMUSCULAR | Status: DC | PRN
  Administered 2020-04-22: 1 mg via INTRAVENOUS

## 2020-04-22 MED ORDER — ONDANSETRON HCL 4 MG/2ML IJ SOLN: 4.0000 mg | Freq: Four times a day (QID) | INTRAMUSCULAR | Status: DC | PRN

## 2020-04-22 MED ORDER — LIDOCAINE HCL (PF) 1 % IJ SOLN
INTRAMUSCULAR | Status: DC | PRN
  Administered 2020-04-22: 2 mL

## 2020-04-22 MED ORDER — ACETAMINOPHEN 325 MG PO TABS: 650.0000 mg | ORAL_TABLET | ORAL | Status: DC | PRN

## 2020-04-22 MED ORDER — SODIUM CHLORIDE 0.9 % IV SOLN: 250.0000 mL | INTRAVENOUS | Status: DC | PRN

## 2020-04-22 MED ORDER — SODIUM CHLORIDE 0.9% FLUSH: 3.0000 mL | Freq: Two times a day (BID) | INTRAVENOUS | Status: DC

## 2020-04-22 MED ORDER — HEPARIN (PORCINE) IN NACL 1000-0.9 UT/500ML-% IV SOLN
INTRAVENOUS | Status: AC
  Filled 2020-04-22: qty 1000

## 2020-04-22 MED ORDER — CLOPIDOGREL BISULFATE 75 MG PO TABS: 75.0000 mg | ORAL_TABLET | ORAL | Status: DC

## 2020-04-22 MED ORDER — SODIUM CHLORIDE 0.9% FLUSH: 3.0000 mL | INTRAVENOUS | Status: DC | PRN

## 2020-04-22 MED ORDER — FENTANYL CITRATE (PF) 100 MCG/2ML IJ SOLN
INTRAMUSCULAR | Status: AC
  Filled 2020-04-22: qty 2

## 2020-04-22 MED ORDER — SODIUM CHLORIDE 0.9 % IV SOLN: INTRAVENOUS | Status: DC

## 2020-04-22 MED ORDER — HEPARIN SODIUM (PORCINE) 1000 UNIT/ML IJ SOLN
INTRAMUSCULAR | Status: AC
  Filled 2020-04-22: qty 1

## 2020-04-22 MED ORDER — FENTANYL CITRATE (PF) 100 MCG/2ML IJ SOLN
INTRAMUSCULAR | Status: DC | PRN
  Administered 2020-04-22: 50 ug via INTRAVENOUS

## 2020-04-22 MED ORDER — MIDAZOLAM HCL 2 MG/2ML IJ SOLN
INTRAMUSCULAR | Status: AC
  Filled 2020-04-22: qty 2

## 2020-04-22 MED ORDER — ASPIRIN 81 MG PO CHEW: 81.0000 mg | CHEWABLE_TABLET | ORAL | Status: DC

## 2020-04-22 SURGICAL SUPPLY — 12 items
CATH INFINITI 5FR JK (CATHETERS) ×2 IMPLANT
CATH LAUNCHER 6FR JR4 (CATHETERS) ×2 IMPLANT
DEVICE RAD COMP TR BAND LRG (VASCULAR PRODUCTS) ×2 IMPLANT
GLIDESHEATH SLEND SS 6F .021 (SHEATH) ×2 IMPLANT
GUIDEWIRE INQWIRE 1.5J.035X260 (WIRE) ×1 IMPLANT
GUIDEWIRE PRESSURE COMET II (WIRE) ×2 IMPLANT
INQWIRE 1.5J .035X260CM (WIRE) ×2
KIT ESSENTIALS PG (KITS) ×2 IMPLANT
KIT HEART LEFT (KITS) ×2 IMPLANT
PACK CARDIAC CATHETERIZATION (CUSTOM PROCEDURE TRAY) ×2 IMPLANT
TRANSDUCER W/STOPCOCK (MISCELLANEOUS) ×2 IMPLANT
TUBING CIL FLEX 10 FLL-RA (TUBING) ×2 IMPLANT

## 2020-04-22 NOTE — Progress Notes (Signed)
Spoke with telemetry unit, they called stating pt having a lot of PVC's, pt having trigeminy, bigeminy, and couplets, Spoke with Ria Comment PA/ cardiology, she is aware and no furhter orders at this time.

## 2020-04-22 NOTE — Discharge Instructions (Signed)
Radial Site Care  This sheet gives you information about how to care for yourself after your procedure. Your health care provider may also give you more specific instructions. If you have problems or questions, contact your health care provider. What can I expect after the procedure? After the procedure, it is common to have:  Bruising and tenderness at the catheter insertion area. Follow these instructions at home: Medicines  Take over-the-counter and prescription medicines only as told by your health care provider. Insertion site care  Follow instructions from your health care provider about how to take care of your insertion site. Make sure you: ? Wash your hands with soap and water before you change your bandage (dressing). If soap and water are not available, use hand sanitizer. ? Change your dressing as told by your health care provider. ? Leave stitches (sutures), skin glue, or adhesive strips in place. These skin closures may need to stay in place for 2 weeks or longer. If adhesive strip edges start to loosen and curl up, you may trim the loose edges. Do not remove adhesive strips completely unless your health care provider tells you to do that.  Check your insertion site every day for signs of infection. Check for: ? Redness, swelling, or pain. ? Fluid or blood. ? Pus or a bad smell. ? Warmth.  Do not take baths, swim, or use a hot tub until your health care provider approves.  You may shower 24-48 hours after the procedure, or as directed by your health care provider. ? Remove the dressing and gently wash the site with plain soap and water. ? Pat the area dry with a clean towel. ? Do not rub the site. That could cause bleeding.  Do not apply powder or lotion to the site. Activity   For 24 hours after the procedure, or as directed by your health care provider: ? Do not flex or bend the affected arm. ? Do not push or pull heavy objects with the affected arm. ? Do not  drive yourself home from the hospital or clinic. You may drive 24 hours after the procedure unless your health care provider tells you not to. ? Do not operate machinery or power tools.  Do not lift anything that is heavier than 10 lb (4.5 kg), or the limit that you are told, until your health care provider says that it is safe.  Ask your health care provider when it is okay to: ? Return to work or school. ? Resume usual physical activities or sports. ? Resume sexual activity. General instructions  If the catheter site starts to bleed, raise your arm and put firm pressure on the site. If the bleeding does not stop, get help right away. This is a medical emergency.  If you went home on the same day as your procedure, a responsible adult should be with you for the first 24 hours after you arrive home.  Keep all follow-up visits as told by your health care provider. This is important. Contact a health care provider if:  You have a fever.  You have redness, swelling, or yellow drainage around your insertion site. Get help right away if:  You have unusual pain at the radial site.  The catheter insertion area swells very fast.  The insertion area is bleeding, and the bleeding does not stop when you hold steady pressure on the area.  Your arm or hand becomes pale, cool, tingly, or numb. These symptoms may represent a serious problem   that is an emergency. Do not wait to see if the symptoms will go away. Get medical help right away. Call your local emergency services (911 in the [place] Do not drive yourself to the hospital. Summary  After the procedure, it is common to have bruising and tenderness at the site.  Follow instructions from your health care provider about how to take care of your radial site wound. Check the wound every day for signs of infection.  Do not lift anything that is heavier than 10 lb (4.5 kg), or the limit that you are told, until your health care provider says  that it is safe. This information is not intended to replace advice given to you by your health care provider. Make sure you discuss any questions you have with your health care provider. Document Revised: 06/05/2017 Document Reviewed: 06/05/2017 Elsevier Patient Education  2020 Elsevier Inc.  

## 2020-04-22 NOTE — Interval H&P Note (Signed)
Cath Lab Visit (complete for each Cath Lab visit)  Clinical Evaluation Leading to the Procedure:   ACS: No.  Non-ACS:    Anginal Classification: CCS III  Anti-ischemic medical therapy: Minimal Therapy (1 class of medications)  Non-Invasive Test Results: No non-invasive testing performed  Prior CABG: No previous CABG      History and Physical Interval Note:  04/22/2020 11:01 AM  Roy Dyer  has presented today for surgery, with the diagnosis of cad with angina.  The various methods of treatment have been discussed with the patient and family. After consideration of risks, benefits and other options for treatment, the patient has consented to  Procedure(s): LEFT HEART CATH AND CORONARY ANGIOGRAPHY (N/A) as a surgical intervention.  The patient's history has been reviewed, patient examined, no change in status, stable for surgery.  I have reviewed the patient's chart and labs.  Questions were answered to the patient's satisfaction.     Kathlyn [place]

## 2020-04-23 ENCOUNTER — Encounter: Payer: Self-pay | Admitting: Family Medicine

## 2020-04-25 ENCOUNTER — Encounter (HOSPITAL_COMMUNITY): Payer: Self-pay | Admitting: Cardiovascular Disease

## 2020-04-25 ENCOUNTER — Telehealth (HOSPITAL_COMMUNITY): Payer: Self-pay | Admitting: Vascular Surgery

## 2020-04-25 NOTE — Telephone Encounter (Signed)
Left pt vm to confirm new chf appt w/ DB 12/14 @ 11 am

## 2020-04-26 ENCOUNTER — Ambulatory Visit (HOSPITAL_COMMUNITY)
Admission: RE | Admit: 2020-04-26 | Discharge: 2020-04-26 | Disposition: A | Discharge status: Home / Self Care | Payer: Managed Care, Other (non HMO) | Source: Ambulatory Visit | Attending: Internal Medicine | Admitting: Internal Medicine

## 2020-04-26 ENCOUNTER — Other Ambulatory Visit: Payer: Self-pay

## 2020-04-26 ENCOUNTER — Encounter (HOSPITAL_COMMUNITY): Payer: Self-pay | Admitting: Internal Medicine

## 2020-04-26 VITALS — BP 134/88 | HR 47 | Wt 237.2 lb

## 2020-04-26 DIAGNOSIS — Z95810 Presence of automatic (implantable) cardiac defibrillator: Secondary | ICD-10-CM | POA: Insufficient documentation

## 2020-04-26 DIAGNOSIS — Z955 Presence of coronary angioplasty implant and graft: Secondary | ICD-10-CM | POA: Diagnosis not present

## 2020-04-26 DIAGNOSIS — I493 Ventricular premature depolarization: Secondary | ICD-10-CM

## 2020-04-26 DIAGNOSIS — Z79899 Other long term (current) drug therapy: Secondary | ICD-10-CM | POA: Insufficient documentation

## 2020-04-26 DIAGNOSIS — Z940 Kidney transplant status: Secondary | ICD-10-CM

## 2020-04-26 DIAGNOSIS — Z7982 Long term (current) use of aspirin: Secondary | ICD-10-CM | POA: Diagnosis not present

## 2020-04-26 DIAGNOSIS — N1831 Chronic kidney disease, stage 3a: Secondary | ICD-10-CM | POA: Diagnosis not present

## 2020-04-26 DIAGNOSIS — I509 Heart failure, unspecified: Secondary | ICD-10-CM | POA: Diagnosis not present

## 2020-04-26 DIAGNOSIS — I252 Old myocardial infarction: Secondary | ICD-10-CM | POA: Insufficient documentation

## 2020-04-26 DIAGNOSIS — Z7902 Long term (current) use of antithrombotics/antiplatelets: Secondary | ICD-10-CM | POA: Insufficient documentation

## 2020-04-26 DIAGNOSIS — I132 Hypertensive heart and chronic kidney disease with heart failure and with stage 5 chronic kidney disease, or end stage renal disease: Secondary | ICD-10-CM | POA: Insufficient documentation

## 2020-04-26 DIAGNOSIS — Z87891 Personal history of nicotine dependence: Secondary | ICD-10-CM | POA: Diagnosis not present

## 2020-04-26 DIAGNOSIS — I25119 Atherosclerotic heart disease of native coronary artery with unspecified angina pectoris: Secondary | ICD-10-CM | POA: Diagnosis not present

## 2020-04-26 DIAGNOSIS — Z7952 Long term (current) use of systemic steroids: Secondary | ICD-10-CM | POA: Diagnosis not present

## 2020-04-26 DIAGNOSIS — M3214 Glomerular disease in systemic lupus erythematosus: Secondary | ICD-10-CM | POA: Insufficient documentation

## 2020-04-26 DIAGNOSIS — I5022 Chronic systolic (congestive) heart failure: Secondary | ICD-10-CM

## 2020-04-26 DIAGNOSIS — I5023 Acute on chronic systolic (congestive) heart failure: Secondary | ICD-10-CM | POA: Diagnosis not present

## 2020-04-26 DIAGNOSIS — Z86010 Personal history of colonic polyps: Secondary | ICD-10-CM | POA: Diagnosis not present

## 2020-04-26 LAB — COMPREHENSIVE METABOLIC PANEL
ALT: 26 U/L (ref 0–44)
AST: 23 U/L (ref 15–41)
Albumin: 3.6 g/dL (ref 3.5–5.0)
Alkaline Phosphatase: 87 U/L (ref 38–126)
Anion gap: 10 (ref 5–15)
BUN: 13 mg/dL (ref 6–20)
CO2: 26 mmol/L (ref 22–32)
Calcium: 9.5 mg/dL (ref 8.9–10.3)
Chloride: 106 mmol/L (ref 98–111)
Creatinine, Ser: 1.46 mg/dL — ABNORMAL HIGH (ref 0.61–1.24)
GFR, Estimated: 57 mL/min — ABNORMAL LOW (ref >60)
Glucose, Bld: 144 mg/dL — ABNORMAL HIGH (ref 70–99)
Potassium: 5.1 mmol/L (ref 3.5–5.1)
Sodium: 142 mmol/L (ref 135–145)
Total Bilirubin: 1.1 mg/dL (ref 0.3–1.2)
Total Protein: 5.7 g/dL — ABNORMAL LOW (ref 6.5–8.1)

## 2020-04-26 LAB — BRAIN NATRIURETIC PEPTIDE: B Natriuretic Peptide: 1125.1 pg/mL — ABNORMAL HIGH (ref 0.0–100.0)

## 2020-04-26 LAB — MAGNESIUM: Magnesium: 1.7 mg/dL (ref 1.7–2.4)

## 2020-04-26 MED ORDER — MEXILETINE HCL 150 MG PO CAPS: 150.0000 mg | ORAL_CAPSULE | Freq: Two times a day (BID) | ORAL | 6 refills | Status: DC

## 2020-04-26 MED ORDER — FUROSEMIDE 40 MG PO TABS: ORAL_TABLET | ORAL | 11 refills | Status: DC

## 2020-04-26 MED ORDER — POTASSIUM CHLORIDE CRYS ER 20 MEQ PO TBCR: 20.0000 meq | EXTENDED_RELEASE_TABLET | Freq: Every day | ORAL | 11 refills | Status: DC

## 2020-04-26 MED ORDER — ENTRESTO 24-26 MG PO TABS: 1.0000 | ORAL_TABLET | Freq: Two times a day (BID) | ORAL | 11 refills | Status: DC

## 2020-04-26 NOTE — Progress Notes (Signed)
[facility] HF CLINIC CONSULT NOTE  Referring Physician: Dr. Fletcher Dyer Primary Care: Roy Dyer, Roy Dyer Primary Cardiologist: Roy Dyer Nephrology: Roy Dyer/Roy Dyer ([facility])  HPI:  Roy Dyer is a 54 y.o. male with a history of severe premature CAD, SLE s/p renal transplant 2012 ([facility]), OSA on CPA and systolic HF EF 69% (echo 7/94), quit smoking in 2012 referred by Dr. Fletcher Dyer for further management of his HF.   His first MI was in 2001 with stenting to the RCA and circumflex marginal and BMS to LAD in 2012. Catheterization 02/08/2017 for angina which showed 90% lesion in the mid RCA in which DES was placed, LADand LCxstentswerewidely patent. Echocardiogram 04/2016 with LVEF 30% s/pICD placementper Dr. Caryl Dyer.  He underwent an echocardiogram 08/19/2019 that showed an LVEF of 25 to 30% with severe LAE with mild MR and moderate TR.   Had cath 04/22/20 for worsening HF symptoms. Cath as below. No RHC performed   1.  Patent LAD, left circumflex and RCA stents.  There is moderate in-stent restenosis in the left circumflex stent and mild in-stent restenosis in the LAD and RCA stents.  There is a 60% stenosis in the proximal/mid RCA which was evaluated by fractional flow reserve and was not hemodynamically significant with DFR ratio of 0.95. 2.  Severely reduced LV systolic function by echo.  Left ventricular angiography was deferred due to chronic kidney disease and kidney transplant status. 3.  Moderately elevated left ventricular end-diastolic pressure at 27 mmHg. 4.  Frequent PVCs noted before and throughout the procedure  He is here with his wife. Says he struggled over the pandemic and last December started to work out on the treadmill but had to cut back due to knee pain. Over past six months has been feeling worse. More fatigued. Feels dizzy with mild activity. SOB if he goes up steps. Will check BP and will be in 120s. Recent orthopnea or PND. No edema. No currently on lasix. No angina. Will  get "pinches" in his chest. Started noticing PVCs about 5 years ago but much more frequent lately. Wears CPAP every night. Currently on bystolic 5 and losartan 25. Has never been on Entresto, spiro or SGLT2i   ICD interrogated in clinic. No VT. Fluid up and down. Currently up. Activity level ~1.5hr/day Personally reviewed    Review of Systems: [y] = yes, [ ]  = no   General: Weight gain [ ] ; Weight loss [ ] ; Anorexia [ ] ; Fatigue Blue.Reese ]; Fever [ ] ; Chills [ ] ; Weakness [ ]   Cardiac: Chest pain/pressure [ ] ; Resting SOB [ ] ; Exertional SOB [ y]; Orthopnea [ y]; Pedal Edema [ ] ; Palpitations [ ] ; Syncope [ ] ; Presyncope [ ] ; Paroxysmal nocturnal dyspnea[y ]  Pulmonary: Cough [ ] ; Wheezing[ ] ; Hemoptysis[ ] ; Sputum [ ] ; Snoring [ ]   GI: Vomiting[ ] ; Dysphagia[ ] ; Melena[ ] ; Hematochezia [ ] ; Heartburn[y ]; Abdominal pain [ ] ; Constipation [ ] ; Diarrhea [ ] ; BRBPR [ ]   GU: Hematuria[ ] ; Dysuria [ ] ; Nocturia[ ]   Vascular: Pain in legs with walking [ ] ; Pain in feet with lying flat [ ] ; Non-healing sores [ ] ; Stroke [ ] ; TIA [ ] ; Slurred speech [ ] ;  Neuro: Headaches[ ] ; Vertigo[ ] ; Seizures[ ] ; Paresthesias[ ] ;Blurred vision [ ] ; Diplopia [ ] ; Vision changes [ ]   Ortho/Skin: Arthritis [ y]; Joint pain Blue.Reese ]; Muscle pain [ ] ; Joint swelling [ y]; Back Pain [ ] ; Rash [ ]   Psych: Depression[ ] ; Anxiety[ ]   Heme:  Bleeding problems [ ] ; Clotting disorders [ ] ; Anemia [ ]   Endocrine: Diabetes [ y]; Thyroid dysfunction[ ]    Past Medical History:  Diagnosis Date  . AICD (automatic cardioverter/defibrillator) present    Dr. Paschal Dyer follows remotely-yrly checks, Dr. Jonne Dyer  . Avascular necrosis of bone of hip ([place]) 2010 surg   Left hip arthroplasty: from chronic systemic steroids taken for his Lupus  . Barrett's esophagus 2017; 2021  . Blood transfusion without reported diagnosis 2010, 2012  . CAD (coronary artery disease)    **DAPT long term recommended by cards**.  a. stents RCA/Circ 2001  b. BMS to LAD 07/2010  c. 01/2017: s/p DES to RCA.   . Cataract    left eye  . CHF (congestive heart failure) ([place])   . Chronic headaches 02/2017   As of 01/2017, HA's no better, neurologist ordered CT.  ESR normal at that time.  HA'S COMPLETELY RESOLVED AFTER HE GOT ON CPAP 2019/2020.  [name] Chronic renal insufficiency, stage II (mild)    GFR 65 ml/min 07/19/15 at local renal f/u (Cr 1.29).  GFR 64 ml/min (cr 1.3) at local renal f/u 01/16/17.  . Erythrocytosis    Pre and post transplant->on losartan for this  . GERD (gastroesophageal reflux disease)    Hx of esoph stricture and dilatation.  +Barrett's esophagus+hx of aspiration pneumonia-->to get rpt EGD when he Dyer off DAPT (utd as of 01/29/18)  . Gout    s/p renal transplant he was weened off of his allopurinol.  . Hiatal hernia   . History of adenomatous polyp of colon 09/2019   polyp x 1. Recall 5 yrs  . 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 History of renal transplant 11/10/2012   10/2012 [facility]   . Hyperlipidemia   . Hypertension   . implantable cardiac defibrillator single chamber    Medtronic (due to low EF)  . Ischemic cardiomyopathy    Chronic systolic dysfunction--  17/5102 EF 30-35%.   Single chamber ICD 11/20/10 (Dr. Caryl Dyer)  . Left ventricular thrombus 2012   Re-eval 02/2011 showed thrombus RESOLVED, so coumadin was d/c'd (was on it for 19mo  . [place]   . OSA on CPAP 2018   Dr. [name]9/2018: +OSA on home sleep studay; CPAP auto titrate 5-20 started 05/2017.  . Osteoporosis   . Post-transplant erythrocytosis    improved with ARB (valsartan)  . Prediabetes 12/2019   A1c stable long term at 5.6-5.7 until jump up to 6.2% Aug 2021.    Current Outpatient Medications  Medication Sig Dispense Refill  . acetaminophen (TYLENOL) 500 MG tablet Take 1,000 mg by mouth every 6 (six) hours as needed (for pain.).    .Marland Kitchenallopurinol (ZYLOPRIM) 100 MG tablet Take 1  tablet (100 mg total) by mouth daily. 90 tablet 3  . aspirin EC 81 MG tablet Take 81 mg by mouth daily.    .Marland Kitchenatorvastatin (LIPITOR) 80 MG tablet Take 80 mg by mouth at bedtime.    .Marland KitchenBYSTOLIC 5 MG tablet TAKE 1 TABLET BY MOUTH DAILY (Patient taking differently: Take 5 mg by mouth daily.) 90 tablet 3  . clopidogrel (PLAVIX) 75 MG tablet TAKE 1 TABLET BY MOUTH DAILY (Patient taking differently: Take 75 mg by mouth daily.) 90 tablet 3  . DEXILANT 60 MG capsule TAKE 1 CAPSULE BY MOUTH DAILY BEFORE BREAKFAST (Patient taking differently: Take 60 mg by mouth daily.) 90 capsule 0  .  famotidine (PEPCID) 20 MG tablet Take 40 mg by mouth at bedtime.     . fluticasone (FLONASE) 50 MCG/ACT nasal spray Place 1 spray into both nostrils daily.     [name] losartan (COZAAR) 25 MG tablet Take 25 mg by mouth at bedtime.     . magnesium oxide (MAG-OX) 400 MG tablet Take 400 mg by mouth daily.     . mycophenolate (CELLCEPT) 250 MG capsule Take 3 capsules (750 mg total) by mouth 2 (two) times daily. 540 capsule 1  . niacin 500 MG tablet Take 1,000 mg by mouth at bedtime.    . predniSONE (DELTASONE) 5 MG tablet TAKE 1 TABLET BY MOUTH DAILY (Patient taking differently: Take 5 mg by mouth daily with breakfast.) 90 tablet 3  . tacrolimus (PROGRAF) 0.5 MG capsule Take 0.5 mg by mouth every 12 (twelve) hours.     . traMADol (ULTRAM) 50 MG tablet Take 50 mg by mouth every 6 (six) hours as needed. for pain  0  . nitroGLYCERIN (NITROSTAT) 0.4 MG SL tablet Place 1 tablet (0.4 mg total) under the tongue every 5 (five) minutes as needed for chest pain (DO NOT EXCEED 3 DOSES). (Patient not taking: Reported on 04/26/2020) 25 tablet 3   No current facility-administered medications for this encounter.    Allergies  Allergen Reactions  . Triptans Palpitations    Reaction to [place]      Social History   Socioeconomic History  . Marital status: Married    Spouse name: Not on file  . Number of children: 2  . Years of education: Not  on file  . Highest education level: Not on file  Occupational History  . Occupation: CAD Drafter     Employer: [employer]  Tobacco Use  . Smoking status: Former Smoker    Packs/day: 0.50    Years: 30.00    Pack years: 15.00    Types: Cigarettes    Quit date: 08/02/2010    Years since quitting: 9.7  . Smokeless tobacco: Never Used  Vaping Use  . Vaping Use: Never used  Substance and Sexual Activity  . Alcohol use: No    Alcohol/week: 0.0 standard drinks  . Drug use: No  . Sexual activity: Yes    Partners: Female  Other Topics Concern  . Not on file  Social History Narrative   Married, 1 teenage son and 1 teenage daughter.    Occupation: Printmaker.   15 pack-yr smoking hx, quit 07/2010.   Drug Use - no   No alcohol.            Social Determinants of Health   Financial Resource Strain: Not on file  Food Insecurity: Not on file  Transportation Needs: Not on file  Physical Activity: Not on file  Stress: Not on file  Social Connections: Not on file  Intimate Partner Violence: Not on file      Family History  Problem Relation Age of Onset  . Kidney failure Mother   . Lupus Mother   . Stroke Mother   . Diabetes Mother        type 2  . Heart attack Father        X 7  . Hypertension Father   . Hyperlipidemia Father   . Heart attack Sister 51       X 1  . Hyperlipidemia Sister   . Hypertension Sister   . Heart disease Sister   . Heart attack Paternal Grandfather   .  Heart disease Paternal Aunt   . Heart disease Paternal Uncle   . Colon cancer Neg Hx   . Esophageal cancer Neg Hx   . Stomach cancer Neg Hx   . Rectal cancer Neg Hx     Vitals:   04/26/20 1101  BP: 134/88  Pulse: (!) 47  SpO2: 97%  Weight: 107.6 kg (237 lb 3.2 oz)    PHYSICAL EXAM: General:  Well appearing. No respiratory difficulty HEENT: normal Neck: supple. JVP to jaw Carotids 2+ bilat; no bruits. No lymphadenopathy or thryomegaly appreciated. Cor: PMI nondisplaced. Irregular  rate & rhythm. No rubs, gallops or murmurs. Lungs: clear Abdomen: soft, nontender, nondistended. No hepatosplenomegaly. No bruits or masses. Good bowel sounds. Extremities: no cyanosis, clubbing, rash, trace edema Neuro: alert & oriented x 3, cranial nerves grossly intact. moves all 4 extremities w/o difficulty. Affect pleasant.  ECG: NSR 78 with frequent monomorphic PVCs in trigeminy. 1AVB (22m) Anterolateral and inferior Qs Personally reviewed   ASSESSMENT & PLAN:  1. Acute on chronic systolic heart failure due to iCM - Echo 12/17 with LVEF 30% s/pICD - Echo 4/21 EF of 25 to 30% with severe LAE with mild MR and moderate TR.   -cath 04/22/20 for worsening HF symptoms. Stable CAD - NYHA IIIb - Volume status elevated - Suspect recent decompensation related to increase in PVC burden - Continue bystolic 5 daily - Switch losartan to Entresto 24/26 bid - Add lasix 470mMon/Fri with KCL 20 - Place zio patch to quantify PVCs. After Zio complete will start mexilitene 150 bid - If symptoms persist after PVC suppression will need CPX testing to assess for Roy therapies - see back in 2-3 weeks for continued med titration - ICD interrogated as above  2. CAD with prior MI x2 and ischemic cardiomyopathy - multiple stents to LAD, RCA and LCX - cath 04/22/20 for worsening HF symptoms. Stable CAD - Continue DAPT, statin  3. CKD 3a s/p renal transplant in 2012 (due to SLE): -Follows with nephrology, Dr. GoClover Mealynd Dr. [facility]issa [facility] to be around his more recent baseline -Continue prednisone, Prograf and CellCept  4. Frequent PVCs - Place zio patch to quantify PVCs. After Zio complete will start mexilitene 150 bid  Total time spent 50 minutes. Over half that time spent discussing above.   DaGlori BickersMD  2:48 PM

## 2020-04-26 NOTE — Progress Notes (Signed)
ReDS Vest / Clip - 04/26/20 1101      ReDS Vest / Clip   Station Marker D    Ruler Value 33.5    ReDS Value Range Moderate volume overload    ReDS Actual Value 38

## 2020-04-26 NOTE — Patient Instructions (Addendum)
STOP Losartan START Entresto 24/26 mg, one tab twice a day START Lasix 40 mg, one tab twice a week on Mondays and Fridays START Potassium 20 meq, one tab twice a week on Mondays and Fridays START Mexitil 150mg  one tab twice day after 7 days  Labs today We will only contact you if something comes back abnormal or we need to make some changes. Otherwise no news is good news!  Your provider has recommended that  you wear a Zio Patch for 7 days.  This monitor will record your heart rhythm for our review.  IF you have any symptoms while wearing the monitor please press the button.  If you have any issues with the patch or you notice a red or orange light on it please call the company at [phone].  Once you remove the patch please mail it back to the company as soon as possible so we can get the results.  Your physician recommends that you schedule a follow-up appointment in: 2-3 weeks  If you have any questions or concerns before your next appointment please send [place] a message through [place] or call our office at [phone].    TO LEAVE A MESSAGE FOR THE NURSE SELECT OPTION 2, PLEASE LEAVE A MESSAGE INCLUDING: . YOUR NAME . DATE OF BIRTH . CALL BACK NUMBER . REASON FOR CALL**this is important as we prioritize the call backs  YOU WILL RECEIVE A CALL BACK THE SAME DAY AS LONG AS YOU CALL BEFORE 4:00 PM

## 2020-05-03 ENCOUNTER — Encounter: Payer: Self-pay | Admitting: Family Medicine

## 2020-05-12 ENCOUNTER — Other Ambulatory Visit: Payer: Self-pay

## 2020-05-19 ENCOUNTER — Ambulatory Visit (HOSPITAL_COMMUNITY)
Admission: RE | Admit: 2020-05-19 | Discharge: 2020-05-19 | Disposition: A | Discharge status: Home / Self Care | Payer: Managed Care, Other (non HMO) | Source: Ambulatory Visit | Attending: Cardiology | Admitting: Cardiology

## 2020-05-19 ENCOUNTER — Encounter (HOSPITAL_COMMUNITY): Payer: Self-pay

## 2020-05-19 ENCOUNTER — Other Ambulatory Visit: Payer: Self-pay

## 2020-05-19 VITALS — BP 140/80 | HR 61 | Wt 232.8 lb

## 2020-05-19 DIAGNOSIS — Z8349 Family history of other endocrine, nutritional and metabolic diseases: Secondary | ICD-10-CM | POA: Insufficient documentation

## 2020-05-19 DIAGNOSIS — Z955 Presence of coronary angioplasty implant and graft: Secondary | ICD-10-CM | POA: Diagnosis not present

## 2020-05-19 DIAGNOSIS — E1122 Type 2 diabetes mellitus with diabetic chronic kidney disease: Secondary | ICD-10-CM | POA: Diagnosis not present

## 2020-05-19 DIAGNOSIS — Z79899 Other long term (current) drug therapy: Secondary | ICD-10-CM | POA: Insufficient documentation

## 2020-05-19 DIAGNOSIS — Z8249 Family history of ischemic heart disease and other diseases of the circulatory system: Secondary | ICD-10-CM | POA: Diagnosis not present

## 2020-05-19 DIAGNOSIS — Z87891 Personal history of nicotine dependence: Secondary | ICD-10-CM | POA: Diagnosis not present

## 2020-05-19 DIAGNOSIS — Z7982 Long term (current) use of aspirin: Secondary | ICD-10-CM | POA: Diagnosis not present

## 2020-05-19 DIAGNOSIS — I25119 Atherosclerotic heart disease of native coronary artery with unspecified angina pectoris: Secondary | ICD-10-CM | POA: Insufficient documentation

## 2020-05-19 DIAGNOSIS — Z940 Kidney transplant status: Secondary | ICD-10-CM

## 2020-05-19 DIAGNOSIS — Z95810 Presence of automatic (implantable) cardiac defibrillator: Secondary | ICD-10-CM | POA: Diagnosis not present

## 2020-05-19 DIAGNOSIS — G4733 Obstructive sleep apnea (adult) (pediatric): Secondary | ICD-10-CM | POA: Diagnosis not present

## 2020-05-19 DIAGNOSIS — Z7952 Long term (current) use of systemic steroids: Secondary | ICD-10-CM | POA: Insufficient documentation

## 2020-05-19 DIAGNOSIS — I255 Ischemic cardiomyopathy: Secondary | ICD-10-CM | POA: Insufficient documentation

## 2020-05-19 DIAGNOSIS — I5022 Chronic systolic (congestive) heart failure: Secondary | ICD-10-CM | POA: Diagnosis not present

## 2020-05-19 DIAGNOSIS — I252 Old myocardial infarction: Secondary | ICD-10-CM | POA: Diagnosis not present

## 2020-05-19 DIAGNOSIS — Z833 Family history of diabetes mellitus: Secondary | ICD-10-CM | POA: Diagnosis not present

## 2020-05-19 DIAGNOSIS — Z823 Family history of stroke: Secondary | ICD-10-CM | POA: Diagnosis not present

## 2020-05-19 DIAGNOSIS — N1831 Chronic kidney disease, stage 3a: Secondary | ICD-10-CM | POA: Diagnosis not present

## 2020-05-19 DIAGNOSIS — Z841 Family history of disorders of kidney and ureter: Secondary | ICD-10-CM | POA: Diagnosis not present

## 2020-05-19 DIAGNOSIS — I509 Heart failure, unspecified: Secondary | ICD-10-CM

## 2020-05-19 DIAGNOSIS — Z8489 Family history of other specified conditions: Secondary | ICD-10-CM | POA: Diagnosis not present

## 2020-05-19 DIAGNOSIS — I2510 Atherosclerotic heart disease of native coronary artery without angina pectoris: Secondary | ICD-10-CM | POA: Diagnosis not present

## 2020-05-19 DIAGNOSIS — E785 Hyperlipidemia, unspecified: Secondary | ICD-10-CM | POA: Diagnosis not present

## 2020-05-19 DIAGNOSIS — I472 Ventricular tachycardia: Secondary | ICD-10-CM | POA: Diagnosis present

## 2020-05-19 DIAGNOSIS — I493 Ventricular premature depolarization: Secondary | ICD-10-CM

## 2020-05-19 DIAGNOSIS — I130 Hypertensive heart and chronic kidney disease with heart failure and stage 1 through stage 4 chronic kidney disease, or unspecified chronic kidney disease: Secondary | ICD-10-CM | POA: Insufficient documentation

## 2020-05-19 LAB — COMPREHENSIVE METABOLIC PANEL
ALT: 16 U/L (ref 0–44)
AST: 20 U/L (ref 15–41)
Albumin: 3.8 g/dL (ref 3.5–5.0)
Alkaline Phosphatase: 90 U/L (ref 38–126)
Anion gap: 11 (ref 5–15)
BUN: 16 mg/dL (ref 6–20)
CO2: 26 mmol/L (ref 22–32)
Calcium: 9.5 mg/dL (ref 8.9–10.3)
Chloride: 105 mmol/L (ref 98–111)
Creatinine, Ser: 1.57 mg/dL — ABNORMAL HIGH (ref 0.61–1.24)
GFR, Estimated: 52 mL/min — ABNORMAL LOW (ref >60)
Glucose, Bld: 151 mg/dL — ABNORMAL HIGH (ref 70–99)
Potassium: 5 mmol/L (ref 3.5–5.1)
Sodium: 142 mmol/L (ref 135–145)
Total Bilirubin: 1.6 mg/dL — ABNORMAL HIGH (ref 0.3–1.2)
Total Protein: 5.8 g/dL — ABNORMAL LOW (ref 6.5–8.1)

## 2020-05-19 LAB — BRAIN NATRIURETIC PEPTIDE: B Natriuretic Peptide: 1456 pg/mL — ABNORMAL HIGH (ref 0.0–100.0)

## 2020-05-19 MED ORDER — FUROSEMIDE 40 MG PO TABS: 40.0000 mg | ORAL_TABLET | Freq: Every day | ORAL | 3 refills | Status: DC

## 2020-05-19 MED ORDER — MEXILETINE HCL 150 MG PO CAPS: 300.0000 mg | ORAL_CAPSULE | Freq: Two times a day (BID) | ORAL | 6 refills | Status: DC

## 2020-05-19 MED ORDER — ENTRESTO 49-51 MG PO TABS: 1.0000 | ORAL_TABLET | Freq: Two times a day (BID) | ORAL | 6 refills | Status: DC

## 2020-05-19 MED ORDER — POTASSIUM CHLORIDE CRYS ER 20 MEQ PO TBCR: 20.0000 meq | EXTENDED_RELEASE_TABLET | Freq: Every day | ORAL | 11 refills | Status: DC

## 2020-05-19 NOTE — Patient Instructions (Signed)
INCREASE Mexiletine to 300 mg, (two tabs) twice a daily INCREASE Lasix to 40 mg daily INCREASE Potassium to 20 meq daily INCREASE Entresto to 49/51 mg, one tab twice a day  Labs today We will only contact you if something comes back abnormal or we need to make some changes. Otherwise no news is good news!  Labs needed in 7-10 days  Your physician recommends that you schedule a follow-up appointment in: 2-3 weeks  If you have any questions or concerns before your next appointment please send [place] a message through [place] or call our office at [phone].    TO LEAVE A MESSAGE FOR THE NURSE SELECT OPTION 2, PLEASE LEAVE A MESSAGE INCLUDING: . YOUR NAME . DATE OF BIRTH . CALL BACK NUMBER . REASON FOR CALL**this is important as we prioritize the call backs  YOU WILL RECEIVE A CALL BACK THE SAME DAY AS LONG AS YOU CALL BEFORE 4:00 PM   Do the following things EVERYDAY: 1) Weigh yourself in the morning before breakfast. Write it down and keep it in a log. 2) Take your medicines as prescribed 3) Eat low salt foods-Limit salt (sodium) to 2000 mg per day.  4) Stay as active as you can everyday 5) Limit all fluids for the day to less than 2 liters

## 2020-05-19 NOTE — Progress Notes (Signed)
[facility]  NOTE  Referring Physician: Dr. Fletcher Anon Primary Care: Anitra Lauth [name], MD  Nephrology: [place] ([facility]) Primary Cardiologist: Johnsie Cancel HF Cardiologist: Dr. Haroldine Laws  HPI:  Roy Dyer a 55 y.o.malewith a history of severe premature CAD, SLE s/p renal transplant 2012 ([facility]), OSA on CPA and systolic HF EF 49% (echo 7/02), quit smoking in 2012 referred by Dr. Fletcher Anon for further management of his HF.   His first MI was in 2001 with stenting to the RCA and circumflex marginal and BMS to LAD in 2012. Catheterization 02/08/2017 for angina which showed 90% lesion in the mid RCA in which DES was placed, LADand LCxstentswerewidely patent. Echocardiogram 04/2016 with LVEF 30% s/pICD placementper [name].  He underwent an echocardiogram 08/19/2019 that showed an LVEF of 25 to 30% with severe LAE with mild MR and moderate TR.   Had cath 04/22/20 for worsening HF symptoms.  Cath as below. No RHC performed:  1. Patent LAD, left circumflex and RCA stents. There is moderate in-stent restenosis in the left circumflex stent and mild in-stent restenosis in the LAD and RCA stents. There is a 60% stenosis in the proximal/mid RCA which was evaluated by fractional flow reserve and was not hemodynamically significant with DFR ratio of 0.95. 2. Severely reduced LV systolic function by echo. Left ventricular angiography was deferred due to chronic kidney disease and kidney transplant status. 3. Moderately elevated left ventricular end-diastolic pressure at 27 mmHg. 4. Frequent PVCs noted before and throughout the procedure  Clinic follow up on 04/26/20 he was more fatigued, dizzy with mild activity, SOB with steps. Losartan switched to Entresto, added lasix 22m Mon/Fri with KCL 20, & Zio patch placed to quantify PVC burden.  Zio 12/21: 1. Sinus rhythm with first degree AV block - avg HR of 62 bpm. 2. Bundle Branch Block/IVCD was present.  3. 32 runs of  NSVT, fastest interval lasting 10 beats with a max rate of 218 bpm, the longest lasting 15 beats with an avg rate of 110 bpm.  4. 6 runs of SVT, the run with the fastest interval lasting 6 beats with a max rate of 152 bpm, the longest lasting 11 beats with an avg rate of 114 bpm.  5. Idioventricular Rhythm was present. 6. Isolated PVCs were frequent (13.4%, 83648), PVC Couplets were occasional (2.4%, 7371), and PVC Triplets were occasional (1.3%, 2722).  7. Ventricular Bigeminy and Trigeminy were present.  Today he returns for HF follow up. Overall feeling fine. SOB with minimal exertion, this is his baseline. Denies increasing SOB, CP, dizziness, edema, or PND/Orthopnea. Still feeling palpitations consistently, has not felt a difference since starting Mexitil.  Appetite ok. No fever or chills. Weight at home ~227-232 pounds. Weight down 5 lbs from last visit. Taking all medications. Wears CPAP every night.  ROS:  Please see the history of present illness.   All other systems are personally reviewed and negative.       Past Medical History:  Diagnosis Date  . AICD (automatic cardioverter/defibrillator) present    [name] remotely-yrly checks, [name] . [place]   Left hip arthroplasty: from chronic systemic steroids taken for his Lupus  . Barrett's esophagus 2017; 2021  . Blood transfusion without reported diagnosis 2010, 2012  . CAD (coronary artery disease)    **DAPT long term recommended by cards**.  a. stents RCA/Circ 2001 b. BMS to LAD 07/2010  c. 01/2017: s/p DES to  RCA.   . Cataract    left eye  . CHF (congestive heart failure) ([place])   . Chronic headaches 02/2017   As of 01/2017, HA's no better, neurologist ordered CT.  ESR normal at that time.  HA'S COMPLETELY RESOLVED AFTER HE GOT ON CPAP 2019/2020.  [name] Chronic renal insufficiency, stage II (mild)    GFR 65 ml/min 07/19/15 at local renal f/u (Cr 1.29).  GFR  64 ml/min (cr 1.3) at local renal f/u 01/16/17.  . Erythrocytosis    Pre and post transplant->on losartan for this  . GERD (gastroesophageal reflux disease)    Hx of esoph stricture and dilatation.  +Barrett's esophagus+hx of aspiration pneumonia-->to get rpt EGD when he comes off DAPT (utd as of 01/29/18)  . Gout    s/p renal transplant he was weened off of his allopurinol.  . Hiatal hernia   . History of adenomatous polyp of colon 09/2019   polyp x 1. Recall 5 yrs  . 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 History of renal transplant 11/10/2012   10/2012 [facility]   . Hyperlipidemia   . Hypertension   . implantable cardiac defibrillator single chamber    Medtronic (due to low EF)  . Ischemic cardiomyopathy    Chronic systolic dysfunction--  16/1096 EF 30-35%.   Single chamber ICD 11/20/10 ([name])  . Left ventricular thrombus 2012   Re-eval 02/2011 showed thrombus RESOLVED, so coumadin was d/c'd (was on it for 44mo  . Lupus ([place]   . OSA on CPAP 2018   Dr. [name]9/2018: +OSA on home sleep studay; CPAP auto titrate 5-20 started 05/2017.  . Osteoporosis   . Post-transplant erythrocytosis    improved with ARB (valsartan)  . Prediabetes 12/2019   A1c stable long term at 5.6-5.7 until jump up to 6.2% Aug 2021.    Current Outpatient Medications  Medication Sig Dispense Refill  . acetaminophen (TYLENOL) 500 MG tablet Take 1,000 mg by mouth every 6 (six) hours as needed (for pain.).    .Marland Kitchenallopurinol (ZYLOPRIM) 100 MG tablet Take 1 tablet (100 mg total) by mouth daily. 90 tablet 3  . aspirin EC 81 MG tablet Take 81 mg by mouth daily.    .Marland Kitchenatorvastatin (LIPITOR) 80 MG tablet Take 80 mg by mouth at bedtime.    .Marland KitchenBYSTOLIC 5 MG tablet TAKE 1 TABLET BY MOUTH DAILY (Patient taking differently: Take 5 mg by mouth daily.) 90 tablet 3  . clopidogrel (PLAVIX) 75 MG tablet TAKE 1 TABLET BY MOUTH DAILY  (Patient taking differently: Take 75 mg by mouth daily.) 90 tablet 3  . DEXILANT 60 MG capsule TAKE 1 CAPSULE BY MOUTH DAILY BEFORE BREAKFAST (Patient taking differently: Take 60 mg by mouth daily.) 90 capsule 0  . famotidine (PEPCID) 20 MG tablet Take 40 mg by mouth at bedtime.     . fluticasone (FLONASE) 50 MCG/ACT nasal spray Place 1 spray into both nostrils daily.     .Marland Kitchenlosartan (COZAAR) 25 MG tablet Take 25 mg by mouth at bedtime.     . magnesium oxide (MAG-OX) 400 MG tablet Take 400 mg by mouth daily.     . mycophenolate (CELLCEPT) 250 MG capsule Take 3 capsules (750 mg total) by mouth 2 (two) times daily. 540 capsule 1  . niacin 500 MG tablet Take 1,000 mg by mouth at bedtime.    . predniSONE (DELTASONE) 5 MG tablet TAKE  1 TABLET BY MOUTH DAILY (Patient taking differently: Take 5 mg by mouth daily with breakfast.) 90 tablet 3  . tacrolimus (PROGRAF) 0.5 MG capsule Take 0.5 mg by mouth every 12 (twelve) hours.     . traMADol (ULTRAM) 50 MG tablet Take 50 mg by mouth every 6 (six) hours as needed. for pain  0  . nitroGLYCERIN (NITROSTAT) 0.4 MG SL tablet Place 1 tablet (0.4 mg total) under the tongue every 5 (five) minutes as needed for chest pain (DO NOT EXCEED 3 DOSES). (Patient not taking: Reported on 04/26/2020) 25 tablet 3   No current facility-administered medications for this encounter.         Allergies  Allergen Reactions  . Triptans Palpitations    Reaction to Maxalt     Social History        Socioeconomic History  . Marital status: Married    Spouse name: Not on file  . Number of children: 2  . Years of education: Not on file  . Highest education level: Not on file  Occupational History  . Occupation: CAD Drafter     Employer: [employer]  Tobacco Use  . Smoking status: Former Smoker    Packs/day: 0.50    Years: 30.00    Pack years: 15.00    Types: Cigarettes    Quit date: 08/02/2010    Years since quitting: 9.7  . Smokeless  tobacco: Never Used  Vaping Use  . Vaping Use: Never used  Substance and Sexual Activity  . Alcohol use: No    Alcohol/week: 0.0 standard drinks  . Drug use: No  . Sexual activity: Yes    Partners: Female  Other Topics Concern  . Not on file  Social History Narrative   Married, 1 teenage son and 1 teenage daughter.    Occupation: Printmaker.   15 pack-yr smoking hx, quit 07/2010.   Drug Use - no   No alcohol.            Social Determinants of Health   Financial Resource Strain: Not on file  Food Insecurity: Not on file  Transportation Needs: Not on file  Physical Activity: Not on file  Stress: Not on file  Social Connections: Not on file  Intimate Partner Violence: Not on file          Family History  Problem Relation Age of Onset  . Kidney failure Mother   . Lupus Mother   . Stroke Mother   . Diabetes Mother        type 2  . Heart attack Father        X 7  . Hypertension Father   . Hyperlipidemia Father   . Heart attack Sister 34       X 1  . Hyperlipidemia Sister   . Hypertension Sister   . Heart disease Sister   . Heart attack Paternal Grandfather   . Heart disease Paternal Aunt   . Heart disease Paternal Uncle   . Colon cancer Neg Hx   . Esophageal cancer Neg Hx   . Stomach cancer Neg Hx   . Rectal cancer Neg Hx        Vitals:   05/19/2020  BP: 140/80  Pulse: 61  SpO2: 97%  Weight: 105.597 kg (232 lb 12.8 oz)   Wt Readings from Last 3 Encounters:  05/19/20 105.6 kg (232 lb 12.8 oz)  04/26/20 107.6 kg (237 lb 3.2 oz)  04/22/20 104.3 kg (230 lb)  PHYSICAL EXAM: General:  NAD. No resp difficulty. HEENT: Normal Neck: Supple. JVD~7-8. Carotids 2+ bilat; no bruits. No lymphadenopathy or thryomegaly appreciated. Cor: PMI nondisplaced. Regular rate & rhythm. No rubs, gallops or murmurs. Lungs: Clear Abdomen: Obese, soft, nontender, nondistended. No hepatosplenomegaly. No bruits or masses.  Good bowel sounds. Extremities: No cyanosis, clubbing, rash, 1+ bilateral lower extremity edema; LUE fistula + bruit/thrill Neuro: Alert & oriented x 3, cranial nerves grossly intact. Moves all 4 extremities w/o difficulty. Affect pleasant.   ECG: NSR 64 bpm, 1st AV block (226 ms), frequent monomorphic PVCs, although less frequent than last ECG (Personally reviewed).  ICD interrogation: Optivol index elevated above threshold, impedence down. No VT/VF, patient activity ~1.5 hrs/day. (Personally reviewed). ReDs: 43%  ASSESSMENT & PLAN:  1. Chronic systolic heart failure due to iCM - Echo 12/17 with LVEF 30% s/pICD. - Echo 4/21 EF of 25 to 30% with severe LAE with mild MR and moderate TR. - Cath 04/22/20 for worsening HF symptoms. Stable CAD. - Suspect recent decompensation related to increase in PVC burden. - Mildly improved but still NYHA III-IIIb, volume status elevated. ReDs 43%. - Increase lasix to 40 mg daily + 20 mEq of KCl daily. - Increase Entresto 49/51 bid. - Continue Bystolic 5 mg daily.  - If symptoms persist after PVC suppression will need CPX testing to assess for advanced therapies. - CMET & BNP today. Repeat BMET 7-10 days.  2. CAD with prior MI x2 and ischemic cardiomyopathy - Multiple stents to LAD, RCA and LCX - Cath 04/22/20 for worsening HF symptoms. Stable CAD. - Continue DAPT, statin.  3. CKD 3a s/p renal transplant in 2012 (due to SLE): - Follows with nephrology, Dr. Clover Mealy and Dr. Lissa Merlin at [facility]. - Creatinine 1.2-1.6 on 04/16/2020 per patient report at renal transplant office at [facility] which appears to be around his more recent baseline. - Continue prednisone, Prograf and CellCept. - Labs today.  4. Frequent PVCs - 13.4% PVC burden on Zio with runs of SVT & NSVT. - Increase mexilitene to 300 mg bid. Consider adding amio next if PVCs not sufficiently suppressed. - CMET today.  Follow back in APP clinic in 2-3 weeks for continued med  titration  Rafael Bihari, FNP-BC 05/19/20 9:05 AM   Patient seen and examined with the above-signed Advanced Practice Provider and/or Housestaff. I personally reviewed laboratory data, imaging studies and relevant notes. I independently examined the patient and formulated the important aspects of the plan. I have edited the note to reflect any of my changes or salient points. I have personally discussed the plan with the patient and/or family.  Remains volume overloaded with frequent PVCs despite recent med changes and addition of mexilitene.   General:  Sitting in chair. No resp difficulty HEENT: normal Neck: supple. JVP 8-9 Carotids 2+ bilat; no bruits. No lymphadenopathy or thryomegaly appreciated. Cor: PMI nondisplaced. Mildly iegular rate & rhythm. No rubs, gallops or murmurs. Lungs: clear Abdomen: soft, nontender, nondistended. No hepatosplenomegaly. No bruits or masses. Good bowel sounds. Extremities: no cyanosis, clubbing, rash, 1-2+ edema  + LUE AVF Neuro: alert & orientedx3, cranial nerves grossly intact. moves all 4 extremities w/o difficulty. Affect pleasant  Mildly improved from last visit but still volume overloaded with PVCs. Increase lasix to 40 daily. Increase mexilitene to 300 bid. See back in 1-2 weeks. If still having PVCs would requantify with Zio and consider adding amio.   [name], MD  12:37 PM

## 2020-05-19 NOTE — Progress Notes (Signed)
ReDS Vest / Clip - 05/19/20 0900      ReDS Vest / Clip   Station Marker D    Ruler Value 32    ReDS Value Range High volume overload    ReDS Actual Value 43    Anatomical Comments sitting

## 2020-05-22 NOTE — Addendum Note (Signed)
Encounter addended by: Jolaine Artist, MD on: 05/22/2020 12:38 PM  Actions taken: Visit diagnoses modified, Level of Service modified

## 2020-05-23 ENCOUNTER — Encounter (HOSPITAL_COMMUNITY): Payer: Self-pay

## 2020-05-27 ENCOUNTER — Other Ambulatory Visit (HOSPITAL_COMMUNITY): Payer: Managed Care, Other (non HMO)

## 2020-06-08 NOTE — Progress Notes (Signed)
Primary Care:McGowen, [name], MD  Nephrology: [place] ([facility]) Primary Cardiologist:Nishan HF Cardiologist: Dr. Haroldine Laws HPI: Roy Dyer a 55 y.o.malewith a history ofsevere premature CAD, SLE s/p renal transplant 2012 ([facility]), OSA on CPA and systolic HF EF 70% (echo 4/88), quit smoking in 2012.    His firstMIwasin 2001 with stenting to the RCA and circumflexmarginaland BMS to LAD in 2012.Catheterization 02/08/2017 for angina which showed 90% lesion in the mid RCA in which DES was placed, LADand LCxstentswerewidely patent. Echocardiogram 04/2016 with LVEF 30% s/pICD placementper [name].  Saw Dr Haroldine Laws 1/6. Mexiletine increased due to high PVC burden. Also entresto + lasix increased.   Today he returns for HF follow up.Overall feeling ok. Not having palpitations as often.  Moving around the house. Limited walking due to knee pain. Denies SOB/PND/Orthopnea. Uses CPAP every night. Appetite ok. No fever or chills. Weight at home has gone down 5 pounds. Taking all medications.   Cardiac Studies  He underwent an echocardiogram 08/19/2019 that showed an LVEF of 25 to 30% with severe LAE with mild MR and moderate TR.   Had cath 04/22/20 for worsening HF symptoms.  Cath as below. No RHC performed:  1. Patent LAD, left circumflex and RCA stents. There is moderate in-stent restenosis in the left circumflex stent and mild in-stent restenosis in the LAD and RCA stents. There is a 60% stenosis in the proximal/mid RCA which was evaluated by fractional flow reserve and was not hemodynamically significant with DFR ratio of 0.95. 2. Severely reduced LV systolic function by echo. Left ventricular angiography was deferred due to chronic kidney disease and kidney transplant status. 3. Moderately elevated left ventricular end-diastolic pressure at 27 mmHg. 4. Frequent PVCs noted before and throughout the procedure  Clinic follow up on 04/26/20 he was more  fatigued, dizzy with mild activity, SOB with steps. Losartan switched to Entresto, added lasix 15m Mon/Fri with KCL 20, & Zio patch placed to quantify PVC burden.  Zio 12/21: 1. Sinus rhythm with first degree AV block - avg HR of 62 bpm. 2. Bundle Branch Block/IVCD was present.  3. 32 runs of NSVT, fastest interval lasting 10 beats with a max rate of 218 bpm, the longest lasting 15 beats with an avg rate of 110 bpm.  4. 6 runs of SVT, the run with the fastest interval lasting 6 beats with a max rate of 152 bpm, the longest lasting 11 beats with an avg rate of 114 bpm.  5. Idioventricular Rhythm was present. 6. Isolated PVCs were frequent (13.4%, 83648), PVC Couplets were occasional (2.4%, 7371), and PVC Triplets were occasional (1.3%, 2722).  7. Ventricular Bigeminy and Trigeminy were present.   ROS: All systems negative except as listed in HPI, PMH and Problem List.  SH:  Social History   Socioeconomic History  . Marital status: Married    Spouse name: Not on file  . Number of children: 2  . Years of education: Not on file  . Highest education level: Not on file  Occupational History  . Occupation: CAD Drafter     Employer: [employer]  Tobacco Use  . Smoking status: Former Smoker    Packs/day: 0.50    Years: 30.00    Pack years: 15.00    Types: Cigarettes    Quit date: 08/02/2010    Years since quitting: 9.8  . Smokeless tobacco: Never Used  Vaping Use  . Vaping Use: Never used  Substance and Sexual Activity  . Alcohol use: No  Alcohol/week: 0.0 standard drinks  . Drug use: No  . Sexual activity: Yes    Partners: Female  Other Topics Concern  . Not on file  Social History Narrative   Married, 1 teenage son and 1 teenage daughter.    Occupation: Printmaker.   15 pack-yr smoking hx, quit 07/2010.   Drug Use - no   No alcohol.            Social Determinants of Health   Financial Resource Strain: Not on file  Food Insecurity: Not on file  Transportation  Needs: Not on file  Physical Activity: Not on file  Stress: Not on file  Social Connections: Not on file  Intimate Partner Violence: Not on file    FH:  Family History  Problem Relation Age of Onset  . Kidney failure Mother   . Lupus Mother   . Stroke Mother   . Diabetes Mother        type 2  . Heart attack Father        X 7  . Hypertension Father   . Hyperlipidemia Father   . Heart attack Sister 43       X 1  . Hyperlipidemia Sister   . Hypertension Sister   . Heart disease Sister   . Heart attack Paternal Grandfather   . Heart disease Paternal Aunt   . Heart disease Paternal Uncle   . Colon cancer Neg Hx   . Esophageal cancer Neg Hx   . Stomach cancer Neg Hx   . Rectal cancer Neg Hx     Past Medical History:  Diagnosis Date  . AICD (automatic cardioverter/defibrillator) present    Dr. Paschal Dopp follows remotely-yrly checks, Dr. Jonne Ply  . Avascular necrosis of bone of hip ([place]) 2010 surg   Left hip arthroplasty: from chronic systemic steroids taken for his Lupus  . Barrett's esophagus 2017; 2021  . Blood transfusion without reported diagnosis 2010, 2012  . CAD (coronary artery disease)    **DAPT long term recommended by cards**.  a. stents RCA/Circ 2001 b. BMS to LAD 07/2010  c. 01/2017: s/p DES to RCA.   . Cataract    left eye  . CHF (congestive heart failure) ([place])   . Chronic headaches 02/2017   As of 01/2017, HA's no better, neurologist ordered CT.  ESR normal at that time.  HA'S COMPLETELY RESOLVED AFTER HE GOT ON CPAP 2019/2020.  [name] Chronic renal insufficiency, stage 3 (moderate) (HCC)    GFR 55-60  . Erythrocytosis    Pre and post transplant->on losartan for this  . GERD (gastroesophageal reflux disease)    Hx of esoph stricture and dilatation.  +Barrett's esophagus+hx of aspiration pneumonia-->to get rpt EGD when he comes off DAPT (utd as of 01/29/18)  . Gout    s/p renal transplant he was weened off of his allopurinol.  . Hiatal hernia   . History  of adenomatous polyp of colon 09/2019   polyp x 1. Recall 5 yrs  . 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 History of renal transplant 11/10/2012   10/2012 [facility]   . Hyperlipidemia   . Hypertension   . implantable cardiac defibrillator single chamber    [employer]tronic (due to low EF)  . Ischemic cardiomyopathy    Chronic systolic dysfunction--  42/8768 EF 30-35%.   Single chamber ICD 11/20/10 ([name])  . Left ventricular thrombus 2012  Re-eval 02/2011 showed thrombus RESOLVED, so coumadin was d/c'd (was on it for 20mo  . [place]   . OSA on CPAP 2018   Dr. [name]9/2018: +OSA on home sleep studay; CPAP auto titrate 5-20 started 05/2017.  . Osteoporosis   . Prediabetes 12/2019   A1c stable long term at 5.6-5.7 until jump up to 6.2% Aug 2021.    Current Outpatient Medications  Medication Sig Dispense Refill  . acetaminophen (TYLENOL) 500 MG tablet Take 1,000 mg by mouth every 6 (six) hours as needed (for pain.).    .Marland Kitchenallopurinol (ZYLOPRIM) 100 MG tablet Take 1 tablet (100 mg total) by mouth daily. 90 tablet 3  . aspirin EC 81 MG tablet Take 81 mg by mouth daily.    .Marland Kitchenatorvastatin (LIPITOR) 80 MG tablet Take 80 mg by mouth at bedtime.    .Marland KitchenBYSTOLIC 5 MG tablet TAKE 1 TABLET BY MOUTH DAILY 90 tablet 3  . clopidogrel (PLAVIX) 75 MG tablet TAKE 1 TABLET BY MOUTH DAILY 90 tablet 3  . DEXILANT 60 MG capsule TAKE 1 CAPSULE BY MOUTH DAILY BEFORE BREAKFAST 90 capsule 0  . famotidine (PEPCID) 20 MG tablet Take 40 mg by mouth at bedtime.     . fluticasone (FLONASE) 50 MCG/ACT nasal spray Place 1 spray into both nostrils daily.     . furosemide (LASIX) 40 MG tablet Take 1 tablet (40 mg total) by mouth daily. 90 tablet 3  . magnesium oxide (MAG-OX) 400 MG tablet Take 400 mg by mouth daily.     .Marland Kitchenmexiletine (MEXITIL) 150 MG capsule Take 2 capsules (300 mg total) by mouth 2 (two) times daily. 120 capsule 6  . mycophenolate  (CELLCEPT) 250 MG capsule Take 3 capsules (750 mg total) by mouth 2 (two) times daily. 540 capsule 1  . niacin 500 MG tablet Take 1,000 mg by mouth at bedtime.    . nitroGLYCERIN (NITROSTAT) 0.4 MG SL tablet Place 1 tablet (0.4 mg total) under the tongue every 5 (five) minutes as needed for chest pain (DO NOT EXCEED 3 DOSES). 25 tablet 3  . potassium chloride SA (KLOR-CON) 20 MEQ tablet Take 1 tablet (20 mEq total) by mouth daily. 30 tablet 11  . predniSONE (DELTASONE) 5 MG tablet TAKE 1 TABLET BY MOUTH DAILY 90 tablet 3  . sacubitril-valsartan (ENTRESTO) 49-51 MG Take 1 tablet by mouth 2 (two) times daily. 60 tablet 6  . tacrolimus (PROGRAF) 0.5 MG capsule Take 0.5 mg by mouth every 12 (twelve) hours.     . traMADol (ULTRAM) 50 MG tablet Take 50 mg by mouth every 6 (six) hours as needed. for pain  0   No current facility-administered medications for this encounter.    Vitals:   06/09/20 1002  BP: 120/76  SpO2: 98%  Weight: 103.1 kg   Filed Weights   06/09/20 1002  Weight: 103.1 kg   Wt Readings from Last 3 Encounters:  06/09/20 103.1 kg  05/19/20 105.6 kg  04/26/20 107.6 kg    Reds Clip 44%.   PHYSICAL EXAM: General:  Walked in the clinic. No resp difficulty HEENT: normal Neck: supple. JVP flat. Carotids 2+ bilaterally; no bruits. No lymphadenopathy or thryomegaly appreciated. Cor: PMI normal. Regular rate & rhythm. No rubs, gallops or murmurs. Lungs: clear Abdomen: soft, nontender, nondistended. No hepatosplenomegaly. No bruits or masses. Good bowel sounds. Extremities: no cyanosis, clubbing, rash, edema Neuro: alert & orientedx3, cranial nerves grossly intact. Moves all 4 extremities w/o difficulty. Affect pleasant.  ECG: SR with occasional PVCs 62 bpm    ASSESSMENT & PLAN: 1. Chronic systolic heart failuredue to iCM - Echo12/17 with LVEF 30% s/pICD. - Echo 4/21 EF of 25 to 30% with severe LAE with mild MR and moderate TR. - Cath 04/22/20 for worsening HF  symptoms.Stable CAD. - Suspect recent decompensation related to increase in PVC burden. - NYHA II. Volume status stable. Reds Clip 44% but on exam he does not appear volume overloaded. Optivol- fluid index well below threshold. No VT/A fib.  Activity 1-2 hours - Continue lasix to 40 mg daily + 20 mEq of KCl daily. - Continue Entresto 49/51 bid. - Continue Bystolic 5 mg daily.  - Could consider SGT2i next visit. If started would need to stop lasix.  - Not on spiro with elevated K.  -Hold off on CPX for now with functional improvement.   2. CAD with prior MI x2 and ischemic cardiomyopathy -Multiple stents to LAD, RCA and LCX - No chest pain.  - Cath 04/22/20 for worsening HF symptoms.Stable CAD. - Continue DAPT, statin.  3. XFQ7K s/prenal transplantin 2012 (due to SLE): - Follows with nephrology, Dr. Jani Files Dr. Lissa [facility]. - Creatinine 1.2-1.6on 04/16/2020 per patient report at renal transplant office at [facility] which appears to be around his more recent baseline. - Continue prednisone, Prograf and CellCept. I reviewed BMET from [facility] on 05/27/20 . Creatinine 1.7  - ? Addition of SGT2i  4. Frequent PVCs - 13.4% PVC burden on Zio with runs of SVT & NSVT. - Occasional PVCs on EKG. Continue mexilitene to 300 mg bid.  - Could consider amio if PVC burden goes back up.   Follow up 2 months with Dr Haroldine Laws.

## 2020-06-09 ENCOUNTER — Other Ambulatory Visit: Payer: Self-pay

## 2020-06-09 ENCOUNTER — Encounter (HOSPITAL_COMMUNITY): Payer: Self-pay

## 2020-06-09 ENCOUNTER — Ambulatory Visit (HOSPITAL_COMMUNITY)
Admission: RE | Admit: 2020-06-09 | Discharge: 2020-06-09 | Disposition: A | Discharge status: Home / Self Care | Payer: Managed Care, Other (non HMO) | Source: Ambulatory Visit | Attending: Adult Health | Admitting: Adult Health

## 2020-06-09 VITALS — BP 120/76 | Wt 227.2 lb

## 2020-06-09 DIAGNOSIS — I2510 Atherosclerotic heart disease of native coronary artery without angina pectoris: Secondary | ICD-10-CM | POA: Insufficient documentation

## 2020-06-09 DIAGNOSIS — Z87891 Personal history of nicotine dependence: Secondary | ICD-10-CM | POA: Insufficient documentation

## 2020-06-09 DIAGNOSIS — I493 Ventricular premature depolarization: Secondary | ICD-10-CM | POA: Diagnosis not present

## 2020-06-09 DIAGNOSIS — I471 Supraventricular tachycardia: Secondary | ICD-10-CM | POA: Diagnosis not present

## 2020-06-09 DIAGNOSIS — Z940 Kidney transplant status: Secondary | ICD-10-CM | POA: Diagnosis not present

## 2020-06-09 DIAGNOSIS — I5022 Chronic systolic (congestive) heart failure: Secondary | ICD-10-CM

## 2020-06-09 DIAGNOSIS — I130 Hypertensive heart and chronic kidney disease with heart failure and stage 1 through stage 4 chronic kidney disease, or unspecified chronic kidney disease: Secondary | ICD-10-CM | POA: Diagnosis present

## 2020-06-09 DIAGNOSIS — Z7982 Long term (current) use of aspirin: Secondary | ICD-10-CM | POA: Insufficient documentation

## 2020-06-09 DIAGNOSIS — Z86010 Personal history of colonic polyps: Secondary | ICD-10-CM | POA: Insufficient documentation

## 2020-06-09 DIAGNOSIS — I252 Old myocardial infarction: Secondary | ICD-10-CM | POA: Diagnosis not present

## 2020-06-09 DIAGNOSIS — Z95810 Presence of automatic (implantable) cardiac defibrillator: Secondary | ICD-10-CM | POA: Insufficient documentation

## 2020-06-09 DIAGNOSIS — N1831 Chronic kidney disease, stage 3a: Secondary | ICD-10-CM | POA: Diagnosis not present

## 2020-06-09 DIAGNOSIS — Z79899 Other long term (current) drug therapy: Secondary | ICD-10-CM | POA: Insufficient documentation

## 2020-06-09 DIAGNOSIS — Z955 Presence of coronary angioplasty implant and graft: Secondary | ICD-10-CM | POA: Insufficient documentation

## 2020-06-09 DIAGNOSIS — Z7952 Long term (current) use of systemic steroids: Secondary | ICD-10-CM | POA: Diagnosis not present

## 2020-06-09 DIAGNOSIS — I255 Ischemic cardiomyopathy: Secondary | ICD-10-CM | POA: Diagnosis not present

## 2020-06-09 DIAGNOSIS — Z7902 Long term (current) use of antithrombotics/antiplatelets: Secondary | ICD-10-CM | POA: Insufficient documentation

## 2020-06-09 NOTE — Patient Instructions (Signed)
It was great to see you today! No medication changes are needed at this time.   Your physician recommends that you schedule a follow-up appointment in: 2 months with Dr Haroldine Laws  If you have any questions or concerns before your next appointment please send [place] a message through [place] or call our office at [phone].    TO LEAVE A MESSAGE FOR THE NURSE SELECT OPTION 2, PLEASE LEAVE A MESSAGE INCLUDING: . YOUR NAME . DATE OF BIRTH . CALL BACK NUMBER . REASON FOR CALL**this is important as we prioritize the call backs  [place] AS LONG AS YOU CALL BEFORE 4:00 PM At the [facility], you and your health needs are our priority. As part of our continuing mission to provide you with exceptional heart care, we have created designated Provider Care Teams. These Care Teams include your primary Cardiologist (physician) and Advanced Practice Providers (APPs- Physician Assistants and Nurse Practitioners) who all work together to provide you with the care you need, when you need it.   You may see any of the following providers on your designated Care Team at your next follow up: [name] Dr [name] . Dr Loralie Champagne . Darrick Grinder, NP . Lyda Jester, [place] . Audry Riles, PharmD   Please be sure to bring in all your medications bottles to every appointment.

## 2020-06-09 NOTE — Progress Notes (Signed)
ReDS Vest / Clip - 06/09/20 1000      ReDS Vest / Clip   Station Marker D    Ruler Value 35    ReDS Value Range High volume overload    ReDS Actual Value 44    Anatomical Comments sitting

## 2020-06-22 ENCOUNTER — Ambulatory Visit (INDEPENDENT_AMBULATORY_CARE_PROVIDER_SITE_OTHER): Payer: Managed Care, Other (non HMO)

## 2020-06-22 DIAGNOSIS — I255 Ischemic cardiomyopathy: Secondary | ICD-10-CM

## 2020-06-24 LAB — CUP PACEART REMOTE DEVICE CHECK
Battery Remaining Longevity: 123 mo
Battery Voltage: 2.99 V
Brady Statistic RV Percent Paced: 4.75 %
Date Time Interrogation Session: 20220209033425
HighPow Impedance: 47 Ohm
HighPow Impedance: 57 Ohm
Implantable Lead Implant Date: 20120709
Implantable Lead Location: 753860
Implantable Lead Model: 6947
Implantable Pulse Generator Implant Date: 20191129
Lead Channel Impedance Value: 285 Ohm
Lead Channel Impedance Value: 361 Ohm
Lead Channel Pacing Threshold Amplitude: 1.375 V
Lead Channel Pacing Threshold Pulse Width: 0.4 ms
Lead Channel Sensing Intrinsic Amplitude: 15.25 mV
Lead Channel Sensing Intrinsic Amplitude: 15.25 mV
Lead Channel Setting Pacing Amplitude: 2.5 V
Lead Channel Setting Pacing Pulse Width: 0.5 ms
Lead Channel Setting Sensing Sensitivity: 0.3 mV

## 2020-06-28 NOTE — Progress Notes (Signed)
Remote ICD transmission.   

## 2020-07-06 ENCOUNTER — Other Ambulatory Visit: Payer: Self-pay

## 2020-07-07 ENCOUNTER — Encounter: Payer: Self-pay | Admitting: Family Medicine

## 2020-07-07 ENCOUNTER — Ambulatory Visit: Payer: Managed Care, Other (non HMO) | Admitting: Family Medicine

## 2020-07-07 VITALS — BP 110/64 | HR 62 | Temp 97.6°F | Resp 16 | Ht 67.0 in | Wt 225.2 lb

## 2020-07-07 DIAGNOSIS — R7303 Prediabetes: Secondary | ICD-10-CM | POA: Diagnosis not present

## 2020-07-07 DIAGNOSIS — N183 Chronic kidney disease, stage 3 unspecified: Secondary | ICD-10-CM

## 2020-07-07 DIAGNOSIS — E7800 Pure hypercholesterolemia, unspecified: Secondary | ICD-10-CM | POA: Diagnosis not present

## 2020-07-07 DIAGNOSIS — I10 Essential (primary) hypertension: Secondary | ICD-10-CM

## 2020-07-07 DIAGNOSIS — Z940 Kidney transplant status: Secondary | ICD-10-CM

## 2020-07-07 DIAGNOSIS — I255 Ischemic cardiomyopathy: Secondary | ICD-10-CM

## 2020-07-07 DIAGNOSIS — I2510 Atherosclerotic heart disease of native coronary artery without angina pectoris: Secondary | ICD-10-CM

## 2020-07-07 LAB — BASIC METABOLIC PANEL
BUN: 19 mg/dL (ref 6–23)
CO2: 33 mEq/L — ABNORMAL HIGH (ref 19–32)
Calcium: 9.6 mg/dL (ref 8.4–10.5)
Chloride: 101 mEq/L (ref 96–112)
Creatinine, Ser: 1.71 mg/dL — ABNORMAL HIGH (ref 0.40–1.50)
GFR: 45.2 mL/min — ABNORMAL LOW (ref >60.00)
Glucose, Bld: 126 mg/dL — ABNORMAL HIGH (ref 70–99)
Potassium: 4.7 mEq/L (ref 3.5–5.1)
Sodium: 140 mEq/L (ref 135–145)

## 2020-07-07 LAB — LIPID PANEL
Cholesterol: 115 mg/dL (ref 0–200)
HDL: 63.5 mg/dL (ref >39.00)
LDL Cholesterol: 34 mg/dL (ref 0–99)
NonHDL: 51.03
Total CHOL/HDL Ratio: 2
Triglycerides: 83 mg/dL (ref 0.0–149.0)
VLDL: 16.6 mg/dL (ref 0.0–40.0)

## 2020-07-07 LAB — HEMOGLOBIN A1C: Hgb A1c MFr Bld: 6.5 % (ref 4.6–6.5)

## 2020-07-07 NOTE — Progress Notes (Signed)
OFFICE VISIT  07/07/2020  CC:  Chief Complaint  Patient presents with  . Follow-up    RCI, 3 mo. Pt is fasting   HPI:    Patient is a 55 y.o. Caucasian male who presents for 6 mo f/u chronic medical problems. A/P as of last visit: "Health maintenance exam: Reviewed age and gender appropriate health maintenance issues (prudent diet, regular exercise, health risks of tobacco and excessive alcohol, use of seatbelts, fire alarms in home, use of sunscreen).  Also reviewed age and gender appropriate health screening as well as vaccine recommendations. Vaccines: he has had covid 19 PLUS booster.  Shingrix->he'll run this by his transplant surgeon in dec this year.  Otherwise all UTD. Labs: BMET, TSH, PSA, Hba1c (IFG).  CBC and lipids and hepatic panel great 3 mo ago. Prostate ca screening: DRE , PSA. Colon ca screening: Recent colonoscopy 09/2019 with adenomatous polyps-> rpt 2026. He gets annual skin ca screening at derm. Dental preventatives UTD."  INTERIM HX: Was having new onset of DOE so he called his cardiologist 04/2020 and they did cath, showed nothing to explain DOE.  ZIO patch showed high PVC burden and it was felt that HF worsening was related to this. Has been seeing HF clinic since then. Started feeling immediate improvement in his DOE when he got started on lasix->he now feels back to prior baseline (can walk up flight of stairs w/out dyspnea). Says mexitil makes him nauseated, sometimes vomits.  Has tried taking w food and w/out but nothing diff. Hasn't changed his diet or activity habits except has cut out salt completely.  HTN: no home monitoring.  No dizziness/syncope/HAs.  HLD: takes atorvastatin 39m qd and tolerates well.  CRI IIIa, hx of renal transplant.  Says sCr was a bit up from baseline after his cath 04/2020 and they've been following this.  No change in antirejection meds.  Prediabetes: a1c up to 6.2% last check 6 mo ago.  CAD and ischemic CM: HF clinic is  maximizing his regimen->BB, entresto, lasix, considering add on of SGL2i. Treating high PVC burden detected on zio patch 04/2020-->mexiletine. Says he has chronically felt a skip in heartbeat every 10 sec or so and has just gotten used to it ("ever since my major heart attack years back").  ROS: no fevers, no CP, no SOB, no wheezing, no cough, no dizziness, no HAs, no rashes, no melena/hematochezia.  No polyuria or polydipsia.  No myalgias or arthralgias.  No focal weakness, paresthesias, or tremors.  No acute vision or hearing abnormalities. No diarrhea, constip, or abd pain.    Past Medical History:  Diagnosis Date  . AICD (automatic cardioverter/defibrillator) present    [name] remotely-yrly checks, [name] . Avascular necrosis of bone of hip ([place] 2010 surg   Left hip arthroplasty: from chronic systemic steroids taken for his Lupus  . Barrett's esophagus 2017; 2021  . Blood transfusion without reported diagnosis 2010, 2012  . CAD (coronary artery disease)    **DAPT long term recommended by cards**.  a. stents RCA/Circ 2001 b. BMS to LAD 07/2010  c. 01/2017: s/p DES to RCA.   . Cataract    left eye  . CHF (congestive heart failure) ([place]   . Chronic headaches 02/2017   As of 01/2017, HA's no better, neurologist ordered CT.  ESR normal at that time.  HA'S COMPLETELY RESOLVED AFTER HE GOT ON CPAP 2019/2020.  .Marland KitchenChronic renal insufficiency, stage 3 (moderate) (HCC)    GFR 55-60  .  Erythrocytosis    Pre and post transplant->on losartan for this  . GERD (gastroesophageal reflux disease)    Hx of esoph stricture and dilatation.  +Barrett's esophagus+hx of aspiration pneumonia-->to get rpt EGD when he comes off DAPT (utd as of 01/29/18)  . Gout    s/p renal transplant he was weened off of his allopurinol.  . Hiatal hernia   . History of adenomatous polyp of colon 09/2019   polyp x 1. Recall 5 yrs  . History of end stage renal disease 04/24/2011   Secondary to SLE:  HD 06/2011-10/2012 (then got renal transplant)  . History of renal transplant 11/10/2012   10/2012 [facility]   . Hyperlipidemia   . Hypertension   . implantable cardiac defibrillator single chamber    Medtronic (due to low EF)  . Ischemic cardiomyopathy    Chronic systolic dysfunction--  74/9449 EF 30-35%.   Single chamber ICD 11/20/10 ([name])  . Left ventricular thrombus 2012   Re-eval 02/2011 showed thrombus RESOLVED, so coumadin was d/c'd (was on it for 31mo  . Lupus ([place]   . OSA on CPAP 2018   Dr. [name]9/2018: +OSA on home sleep studay; CPAP auto titrate 5-20 started 05/2017.  . Osteoporosis   . Prediabetes 12/2019   A1c stable long term at 5.6-5.7 until jump up to 6.2% Aug 2021.    Past Surgical History:  Procedure Laterality Date  . AV FISTULA PLACEMENT  03/28/2011   Procedure: ARTERIOVENOUS (AV) FISTULA CREATION;  Surgeon: TRosetta Posner MD;  [place]  Service: Vascular;  Laterality: Left;  Creation of Left radiocephallic cimino fistula  . AV FISTULA PLACEMENT  04/27/2011   Procedure: ARTERIOVENOUS (AV) FISTULA CREATION;  Surgeon: TRosetta Posner MD;  [place]  Service: Vascular;  Laterality: Left;  left basilic vein transposition  . BIOPSY  09/14/2019   Procedure: BIOPSY;  Surgeon: PJerene Bears MD;  [facility];  Service: Gastroenterology;;  . CARDIAC CATHETERIZATION  08/2010; 04/22/20   08/2010; 04/2020->mild/mod in-stent restenosis but none hemodynamically signif, severe LV dysfxn, frequenct PVCs-->referred to HF clinic for optimization of med mgmt  . CARDIOVASCULAR STRESS TEST  07/2010; 03/2014   03/2014 showed large old infarct and EF 30-35% but no ischemia  . COLONOSCOPY  09/14/2019   Adenoma x 1.  Recall 5 yrs  . COLONOSCOPY WITH PROPOFOL N/A 09/14/2019   Procedure: COLONOSCOPY WITH PROPOFOL;  Surgeon: PJerene Bears MD;  [facility];  Service: Gastroenterology;  Laterality: N/A;  . CORONARY PRESSURE WIRE/FFR WITH 3D MAPPING N/A 04/22/2020    Procedure: Coronary Pressure Wire/FFR w/3D Mapping;  Surgeon: AWellington Hampshire MD;  [place];  Service: Cardiovascular;  Laterality: N/A;  . DEXA  07/05/2017   Normal bone density in radius and spine.  . ESOPHAGOGASTRODUODENOSCOPY  02/2009   Done by Dr. SFuller Planfor hematemesis; esophagitis found.  Repeat recommended 09/2016, at which time he will also likely get his initial screening colonoscopy.  . ESOPHAGOGASTRODUODENOSCOPY  09/14/2019   esophagitis (improved compared to 2017 EGD), Barrett's.  Stomach and duodenum normal.  . ESOPHAGOGASTRODUODENOSCOPY (EGD) WITH PROPOFOL N/A 09/29/2015   REPEAT EGD RECOMMENDED 09/2016.  Barrett's esoph + mild chronic gastritis.  H pylori NEG. Procedure: ESOPHAGOGASTRODUODENOSCOPY (EGD) WITH PROPOFOL;  Surgeon: JJerene Bears MD;  [facility];  Service: Gastroenterology;  Laterality: N/A;  . ESOPHAGOGASTRODUODENOSCOPY (EGD) WITH PROPOFOL N/A 09/14/2019   Procedure: ESOPHAGOGASTRODUODENOSCOPY (EGD) WITH PROPOFOL;  Surgeon: PJerene Bears MD;  Location:  [facility];  Service: Gastroenterology;  Laterality: N/A;  . ICD GENERATOR CHANGEOUT N/A 04/11/2018   Procedure: ICD GENERATOR CHANGEOUT;  Surgeon: Deboraha Sprang, MD;  Location: [place] [facility];  Service: Cardiovascular;  Laterality: N/A;  . INSERT / REPLACE / REMOVE PACEMAKER     medtronic        dr [name]    [place]   icd only  . KIDNEY TRANSPLANT  10/2012   [facility] nephrologist--Dr. Blair Heys  . LEFT HEART CATH AND CORONARY ANGIOGRAPHY N/A 02/08/2017   PCA and DES to mRCA--needs DAPT for at least 6 mo.   Procedure: LEFT HEART CATH AND CORONARY ANGIOGRAPHY;  Surgeon: Sherren Mocha, MD;  Location: [place] [facility];  Service: Cardiovascular;  Laterality: N/A;  . LEFT HEART CATH AND CORONARY ANGIOGRAPHY N/A 04/22/2020   Procedure: LEFT HEART CATH AND CORONARY ANGIOGRAPHY;  Surgeon: Wellington Hampshire, MD;  Location: [place] [facility];  Service: Cardiovascular;  Laterality: N/A;   . PARTIAL HIP ARTHROPLASTY Left 2010   left  . POLYPECTOMY  09/14/2019   Procedure: POLYPECTOMY;  Surgeon: Jerene Bears, MD;  [facility];  Service: Gastroenterology;;  . RENAL BIOPSY    . stents     05-2010 and 2- in 2010 and 1 in 2018.  [name] TOTAL HIP ARTHROPLASTY  07/09/2011   Procedure: TOTAL HIP ARTHROPLASTY;  Surgeon: Kerin Salen, MD;  Location: [place];  Service: Orthopedics;  Laterality: Right;  . TYMPANOSTOMY TUBE PLACEMENT  55 yrs old  . US ECHOCARDIOGRAPHY  12/2010; 02/2014;08/2014;08/2019   02/2014 EF still 25-30%, increased PA pressures.  2016 EF 30-35%, sept/inf hypokinesis, mod tricusp regurg. 08/2019 EF 25-30%, global hypok, sev dil LV w/apical aneur, grd III DD, mod tricus reg, mod pulm htn  . ZIO PATCH  04/2020   HIGH PVC BURDEN.  NSVT, SVT, BBB.    Outpatient Medications Prior to Visit  Medication Sig Dispense Refill  . acetaminophen (TYLENOL) 500 MG tablet Take 1,000 mg by mouth every 6 (six) hours as needed (for pain.).    [name] allopurinol (ZYLOPRIM) 100 MG tablet Take 1 tablet (100 mg total) by mouth daily. 90 tablet 3  . aspirin EC 81 MG tablet Take 81 mg by mouth daily.    [name] atorvastatin (LIPITOR) 80 MG tablet Take 80 mg by mouth at bedtime.    [name] BYSTOLIC 5 MG tablet TAKE 1 TABLET BY MOUTH DAILY 90 tablet 3  . clopidogrel (PLAVIX) 75 MG tablet TAKE 1 TABLET BY MOUTH DAILY 90 tablet 3  . DEXILANT 60 MG capsule TAKE 1 CAPSULE BY MOUTH DAILY BEFORE BREAKFAST 90 capsule 0  . famotidine (PEPCID) 20 MG tablet Take 40 mg by mouth at bedtime.     . fluticasone (FLONASE) 50 MCG/ACT nasal spray Place 1 spray into both nostrils daily.     . furosemide (LASIX) 40 MG tablet Take 1 tablet (40 mg total) by mouth daily. 90 tablet 3  . magnesium oxide (MAG-OX) 400 MG tablet Take 400 mg by mouth daily.     [name] mexiletine (MEXITIL) 150 MG capsule Take 2 capsules (300 mg total) by mouth 2 (two) times daily. 120 capsule 6  . mycophenolate (CELLCEPT) 250 MG capsule Take 3 capsules (750  mg total) by mouth 2 (two) times daily. 540 capsule 1  . niacin 500 MG tablet Take 1,000 mg by mouth at bedtime.    . nitroGLYCERIN (NITROSTAT) 0.4 MG SL tablet Place 1 tablet (0.4 mg total) under the  tongue every 5 (five) minutes as needed for chest pain (DO NOT EXCEED 3 DOSES). 25 tablet 3  . potassium chloride SA (KLOR-CON) 20 MEQ tablet Take 1 tablet (20 mEq total) by mouth daily. 30 tablet 11  . predniSONE (DELTASONE) 5 MG tablet TAKE 1 TABLET BY MOUTH DAILY 90 tablet 3  . sacubitril-valsartan (ENTRESTO) 49-51 MG Take 1 tablet by mouth 2 (two) times daily. 60 tablet 6  . tacrolimus (PROGRAF) 0.5 MG capsule Take 0.5 mg by mouth every 12 (twelve) hours.     . traMADol (ULTRAM) 50 MG tablet Take 50 mg by mouth every 6 (six) hours as needed. for pain  0   No facility-administered medications prior to visit.    Allergies  Allergen Reactions  . Triptans Palpitations    Reaction to Maxalt    ROS As per HPI  PE: Vitals with BMI 07/07/2020 06/09/2020 05/19/2020  Height 5' 7"  - -  Weight 225 lbs 3 oz 227 lbs 3 oz 232 lbs 13 oz  BMI 82.95 - 62.13  Systolic 086 578 469  Diastolic 64 76 80  Pulse 62 - 61     Gen: Alert, well appearing.  Patient is oriented to person, place, time, and situation. AFFECT: pleasant, lucid thought and speech. CV: RRR, occ brief pause, no m/r/g.   LUNGS: CTA bilat, nonlabored resps, good aeration in all lung fields. EXT: no clubbing or cyanosis.  no edema.    LABS:  Lab Results  Component Value Date   TSH 2.04 01/06/2020   Lab Results  Component Value Date   WBC 11.7 (H) 04/19/2020   HGB 16.8 04/19/2020   HCT 48.7 04/19/2020   MCV 88 04/19/2020   PLT 217 04/19/2020   Lab Results  Component Value Date   CREATININE 1.57 (H) 05/19/2020   BUN 16 05/19/2020   NA 142 05/19/2020   K 5.0 05/19/2020   CL 105 05/19/2020   CO2 26 05/19/2020   Lab Results  Component Value Date   ALT 16 05/19/2020   AST 20 05/19/2020   ALKPHOS 90 05/19/2020    BILITOT 1.6 (H) 05/19/2020   Lab Results  Component Value Date   CHOL 110 10/07/2019   Lab Results  Component Value Date   HDL 62 10/07/2019   Lab Results  Component Value Date   LDLCALC 31 10/07/2019   Lab Results  Component Value Date   TRIG 91 10/07/2019   Lab Results  Component Value Date   CHOLHDL 2 07/15/2018   Lab Results  Component Value Date   PSA 1.22 01/06/2020   PSA 1.70 07/15/2018   PSA 1.71 10/23/2010   Lab Results  Component Value Date   HGBA1C 6.2 01/06/2020   IMPRESSION AND PLAN:  1) Prediabetes: hasn't made any diet or exercise changes. Wt loss lately suspected to be largely fluid loss after getting on lasix. A1c and fasting gluc check today.  2) HLD: tolerating atorva 48m qd. LDL was 31 nine mo ago. Recheck flp today.  Hepatic panel normal 6 wks ago.  3) CRI III, hx of renal transplant: recent uptick in sCr (1.57) after getting cardiac cath 04/2020. Nephrol following. Will recheck BMET today.  4) CAD, hx of MI and stents: stable on cath 04/2020. Cont BB, DAPT, statin.  5) CHF: able to go up flight of stairs fine now, which is his baseline level of max exertion. HF clinic working with meds: bystolic, entresto, lasix.  Avoiding spironolactone b/c chronic borderline hyperkalemia. They have  mentioned add on of SGL2i as next step.  6) PVCs: high burden, affecting systolic function, cards has him on mexiletine but he doesn't seem to be tolerating it very well from GI standpoint.  An After Visit Summary was printed and given to the patient.  FOLLOW UP: Return in about 6 months (around 01/04/2021).  Signed:  Crissie Sickles, MD           07/07/2020

## 2020-07-12 ENCOUNTER — Encounter: Payer: Self-pay | Admitting: Family Medicine

## 2020-07-12 MED ORDER — DAPAGLIFLOZIN PROPANEDIOL 10 MG PO TABS: 10.0000 mg | ORAL_TABLET | Freq: Every day | ORAL | 1 refills | Status: DC

## 2020-07-12 NOTE — Telephone Encounter (Signed)
Roy Dyer eRx'd to local pharmacy (pls cancel the rx for this that I accidentally sent to his mail order pharmacy). Stop lasix. F/u in office with me in about 10-14d for recheck and we'll repeat labs at that time.-thx

## 2020-07-26 ENCOUNTER — Other Ambulatory Visit: Payer: Self-pay

## 2020-07-26 ENCOUNTER — Ambulatory Visit: Payer: Managed Care, Other (non HMO) | Admitting: Family Medicine

## 2020-07-26 ENCOUNTER — Encounter: Payer: Self-pay | Admitting: Family Medicine

## 2020-07-26 VITALS — BP 126/79 | HR 60 | Temp 97.7°F | Resp 16 | Ht 67.0 in | Wt 223.6 lb

## 2020-07-26 DIAGNOSIS — N183 Chronic kidney disease, stage 3 unspecified: Secondary | ICD-10-CM | POA: Diagnosis not present

## 2020-07-26 DIAGNOSIS — J019 Acute sinusitis, unspecified: Secondary | ICD-10-CM

## 2020-07-26 DIAGNOSIS — I5022 Chronic systolic (congestive) heart failure: Secondary | ICD-10-CM | POA: Diagnosis not present

## 2020-07-26 DIAGNOSIS — E119 Type 2 diabetes mellitus without complications: Secondary | ICD-10-CM

## 2020-07-26 DIAGNOSIS — N2889 Other specified disorders of kidney and ureter: Secondary | ICD-10-CM

## 2020-07-26 LAB — BASIC METABOLIC PANEL
BUN: 18 mg/dL (ref 6–23)
CO2: 31 mEq/L (ref 19–32)
Calcium: 9.4 mg/dL (ref 8.4–10.5)
Chloride: 105 mEq/L (ref 96–112)
Creatinine, Ser: 1.62 mg/dL — ABNORMAL HIGH (ref 0.40–1.50)
GFR: 48.22 mL/min — ABNORMAL LOW (ref >60.00)
Glucose, Bld: 105 mg/dL — ABNORMAL HIGH (ref 70–99)
Potassium: 5.1 mEq/L (ref 3.5–5.1)
Sodium: 142 mEq/L (ref 135–145)

## 2020-07-26 MED ORDER — CEPHALEXIN 500 MG PO CAPS: 500.0000 mg | ORAL_CAPSULE | Freq: Three times a day (TID) | ORAL | 0 refills | Status: DC

## 2020-07-26 NOTE — Progress Notes (Signed)
OFFICE VISIT  07/26/2020  CC:  Chief Complaint  Patient presents with  . Follow-up    CHF   HPI:    Patient is a 55 y.o. Caucasian male who presents for f/u CRI III and CHF. Last visit 3 wks ago his sCr was up to 1.7 in the setting of recent lasix use for his CHF.  After consultation with his cardiologist we decided to try to help this overall situation by stopping his furosemide and starting a SGLT2 inhibitor. SO-->About 2 wks ago I got him started on farxiga 4m qd and stopped his lasix.   Says no change at all in breathing, feeling fine from this standpoint.  Says urine output the same.  Wt is down about 1.2 lbs over last couple weeks. Last few wks noting gradually increasing thick, occlusive mucous, some perinasal pressure/discomfort.  Some thick, pink tinged PND noted at times.  No fevers or cough.      Past Medical History:  Diagnosis Date  . AICD (automatic cardioverter/defibrillator) present    [name] remotely-yrly checks, [name] . [place]   Left hip arthroplasty: from chronic systemic steroids taken for his Lupus  . Barrett's esophagus 2017; 2021  . Blood transfusion without reported diagnosis 2010, 2012  . CAD (coronary artery disease)    **DAPT long term recommended by cards**.  a. stents RCA/Circ 2001 b. BMS to LAD 07/2010  c. 01/2017: s/p DES to RCA.   . Cataract    left eye  . CHF (congestive heart failure) ([place]   . Chronic headaches 02/2017   As of 01/2017, HA's no better, neurologist ordered CT.  ESR normal at that time.  HA'S COMPLETELY RESOLVED AFTER HE GOT ON CPAP 2019/2020.  .Marland KitchenChronic renal insufficiency, stage 3 (moderate) (HCC)    GFR 55-60  . Erythrocytosis    Pre and post transplant->on losartan for this  . GERD (gastroesophageal reflux disease)    Hx of esoph stricture and dilatation.  +Barrett's esophagus+hx of aspiration pneumonia-->to get rpt EGD when he comes off DAPT (utd as of  01/29/18)  . Gout    s/p renal transplant he was weened off of his allopurinol.  . Hiatal hernia   . History of adenomatous polyp of colon 09/2019   polyp x 1. Recall 5 yrs  . 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 [facility]  . Hyperlipidemia   . Hypertension   . implantable cardiac defibrillator single chamber    Medtronic (due to low EF)  . Ischemic cardiomyopathy    Chronic systolic dysfunction--  131/5176EF 30-35%.   Single chamber ICD 11/20/10 (Dr. KCaryl Comes  . Left ventricular thrombus 2012   Re-eval 02/2011 showed thrombus RESOLVED, so coumadin was d/c'd (was on it for 654mo . [place]  . OSA on CPAP 2018   Dr. YoAnnamaria Boots/2018: +OSA on home sleep studay; CPAP auto titrate 5-20 started 05/2017.  . Osteoporosis   . Prediabetes 12/2019   A1c stable long term at 5.6-5.7 until jump up to 6.2% Aug 2021.    Past Surgical History:  Procedure Laterality Date  . AV FISTULA PLACEMENT  03/28/2011   Procedure: ARTERIOVENOUS (AV) FISTULA CREATION;  Surgeon: ToRosetta PosnerMD;  [facility] Service: Vascular;  Laterality: Left;  Creation of Left radiocephallic cimino  fistula  . AV FISTULA PLACEMENT  04/27/2011   Procedure: ARTERIOVENOUS (AV) FISTULA CREATION;  Surgeon: Rosetta Posner, MD;  Location: [place];  Service: Vascular;  Laterality: Left;  left basilic vein transposition  . BIOPSY  09/14/2019   Procedure: BIOPSY;  Surgeon: Jerene Bears, MD;  [facility];  Service: Gastroenterology;;  . CARDIAC CATHETERIZATION  08/2010; 04/22/20   08/2010; 04/2020->mild/mod in-stent restenosis but none hemodynamically signif, severe LV dysfxn, frequenct PVCs-->referred to HF clinic for optimization of med mgmt  . CARDIOVASCULAR STRESS TEST  07/2010; 03/2014   03/2014 showed large old infarct and EF 30-35% but no ischemia  . COLONOSCOPY  09/14/2019   Adenoma x 1.  Recall 5 yrs   . COLONOSCOPY WITH PROPOFOL N/A 09/14/2019   Procedure: COLONOSCOPY WITH PROPOFOL;  Surgeon: Jerene Bears, MD;  [facility];  Service: Gastroenterology;  Laterality: N/A;  . CORONARY PRESSURE WIRE/FFR WITH 3D MAPPING N/A 04/22/2020   Procedure: Coronary Pressure Wire/FFR w/3D Mapping;  Surgeon: Wellington Hampshire, MD;  Location: [place] [facility];  Service: Cardiovascular;  Laterality: N/A;  . DEXA  07/05/2017   Normal bone density in radius and spine.  . ESOPHAGOGASTRODUODENOSCOPY  02/2009   Done by Dr. Fuller Plan for hematemesis; esophagitis found.  Repeat recommended 09/2016, at which time he will also likely get his initial screening colonoscopy.  . ESOPHAGOGASTRODUODENOSCOPY  09/14/2019   esophagitis (improved compared to 2017 EGD), Barrett's.  Stomach and duodenum normal.  . ESOPHAGOGASTRODUODENOSCOPY (EGD) WITH PROPOFOL N/A 09/29/2015   REPEAT EGD RECOMMENDED 09/2016.  Barrett's esoph + mild chronic gastritis.  H pylori NEG. Procedure: ESOPHAGOGASTRODUODENOSCOPY (EGD) WITH PROPOFOL;  Surgeon: Jerene Bears, MD;  [facility];  Service: Gastroenterology;  Laterality: N/A;  . ESOPHAGOGASTRODUODENOSCOPY (EGD) WITH PROPOFOL N/A 09/14/2019   Procedure: ESOPHAGOGASTRODUODENOSCOPY (EGD) WITH PROPOFOL;  Surgeon: Jerene Bears, MD;  [facility];  Service: Gastroenterology;  Laterality: N/A;  . ICD GENERATOR CHANGEOUT N/A 04/11/2018   Procedure: ICD GENERATOR CHANGEOUT;  Surgeon: Deboraha Sprang, MD;  Location: [place] [facility];  Service: Cardiovascular;  Laterality: N/A;  . INSERT / REPLACE / REMOVE PACEMAKER     medtronic        dr [name]    Mobile   icd only  . KIDNEY TRANSPLANT  10/2012   [facility] nephrologist--Dr. Blair Heys  . LEFT HEART CATH AND CORONARY ANGIOGRAPHY N/A 02/08/2017   PCA and DES to mRCA--needs DAPT for at least 6 mo.   Procedure: LEFT HEART CATH AND CORONARY ANGIOGRAPHY;  Surgeon: Sherren Mocha, MD;  Location: [place] [facility];  Service:  Cardiovascular;  Laterality: N/A;  . LEFT HEART CATH AND CORONARY ANGIOGRAPHY N/A 04/22/2020   Procedure: LEFT HEART CATH AND CORONARY ANGIOGRAPHY;  Surgeon: Wellington Hampshire, MD;  Location: [place] [facility];  Service: Cardiovascular;  Laterality: N/A;  . PARTIAL HIP ARTHROPLASTY Left 2010   left  . POLYPECTOMY  09/14/2019   Procedure: POLYPECTOMY;  Surgeon: Jerene Bears, MD;  [facility];  Service: Gastroenterology;;  . RENAL BIOPSY    . stents     05-2010 and 2- in 2010 and 1 in 2018.  [name] TOTAL HIP ARTHROPLASTY  07/09/2011   Procedure: TOTAL HIP ARTHROPLASTY;  Surgeon: Kerin Salen, MD;  Location: [place];  Service: Orthopedics;  Laterality: Right;  . TYMPANOSTOMY TUBE PLACEMENT  55 yrs old  . US ECHOCARDIOGRAPHY  12/2010; 02/2014;08/2014;08/2019   02/2014 EF still 25-30%, increased PA pressures.  2016 EF 30-35%, sept/inf hypokinesis, mod tricusp regurg. 08/2019 EF 25-30%, global hypok, sev dil LV w/apical aneur, grd III DD, mod tricus reg, mod pulm htn  . ZIO PATCH  04/2020   HIGH PVC BURDEN.  NSVT, SVT, BBB.    Outpatient Medications Prior to Visit  Medication Sig Dispense Refill  . acetaminophen (TYLENOL) 500 MG tablet Take 1,000 mg by mouth every 6 (six) hours as needed (for pain.).    [name] allopurinol (ZYLOPRIM) 100 MG tablet Take 1 tablet (100 mg total) by mouth daily. 90 tablet 3  . aspirin EC 81 MG tablet Take 81 mg by mouth daily.    [name] atorvastatin (LIPITOR) 80 MG tablet Take 80 mg by mouth at bedtime.    [name] BYSTOLIC 5 MG tablet TAKE 1 TABLET BY MOUTH DAILY 90 tablet 3  . clopidogrel (PLAVIX) 75 MG tablet TAKE 1 TABLET BY MOUTH DAILY 90 tablet 3  . dapagliflozin propanediol (FARXIGA) 10 MG TABS tablet Take 1 tablet (10 mg total) by mouth daily before breakfast. 30 tablet 1  . DEXILANT 60 MG capsule TAKE 1 CAPSULE BY MOUTH DAILY BEFORE BREAKFAST 90 capsule 0  . famotidine (PEPCID) 20 MG tablet Take 40 mg by mouth at bedtime.     . magnesium oxide (MAG-OX) 400 MG tablet Take  400 mg by mouth daily.     [name] mexiletine (MEXITIL) 150 MG capsule Take 2 capsules (300 mg total) by mouth 2 (two) times daily. 120 capsule 6  . mycophenolate (CELLCEPT) 250 MG capsule Take 3 capsules (750 mg total) by mouth 2 (two) times daily. 540 capsule 1  . niacin 500 MG tablet Take 1,000 mg by mouth at bedtime.    . potassium chloride SA (KLOR-CON) 20 MEQ tablet Take 1 tablet (20 mEq total) by mouth daily. 30 tablet 11  . predniSONE (DELTASONE) 5 MG tablet TAKE 1 TABLET BY MOUTH DAILY 90 tablet 3  . sacubitril-valsartan (ENTRESTO) 49-51 MG Take 1 tablet by mouth 2 (two) times daily. 60 tablet 6  . tacrolimus (PROGRAF) 0.5 MG capsule Take 0.5 mg by mouth every 12 (twelve) hours.     . traMADol (ULTRAM) 50 MG tablet Take 50 mg by mouth every 6 (six) hours as needed. for pain  0  . fluticasone (FLONASE) 50 MCG/ACT nasal spray Place 1 spray into both nostrils daily.  (Patient not taking: Reported on 07/26/2020)    . furosemide (LASIX) 40 MG tablet Take 1 tablet (40 mg total) by mouth daily. (Patient not taking: Reported on 07/26/2020) 90 tablet 3  . nitroGLYCERIN (NITROSTAT) 0.4 MG SL tablet Place 1 tablet (0.4 mg total) under the tongue every 5 (five) minutes as needed for chest pain (DO NOT EXCEED 3 DOSES). (Patient not taking: Reported on 07/26/2020) 25 tablet 3   No facility-administered medications prior to visit.    Allergies  Allergen Reactions  . Triptans Palpitations    Reaction to Maxalt    ROS As per HPI  PE: Vitals with BMI 07/26/2020 07/07/2020 06/09/2020  Height 5' 7"  5' 7"  -  Weight 223 lbs 10 oz 225 lbs 3 oz 227 lbs 3 oz  BMI 65.46 50.35 -  Systolic 465 681 275  Diastolic 79 64 76  Pulse 60 62 -     Gen: Alert, well appearing.  Patient is oriented to person, place, time, and situation. AFFECT: pleasant, lucid thought and speech. TZG:YFVC: no injection, icteris, swelling, or exudate.  EOMI, PERRLA. Nose: crusty mucosa, scant amount of purulent d/c  bilat, no signif  turbinate swelling. Mouth: lips without lesion/swelling.  Oral mucosa pink and moist. Oropharynx without erythema, exudate, or swelling.  CV: RRR, no m/r/g.   LUNGS: CTA bilat, nonlabored resps, good aeration in all lung fields. EXT: no clubbing or cyanosis.  no edema.    LABS:    Chemistry      Component Value Date/Time   NA 140 07/07/2020 0827   NA 143 04/19/2020 1146   K 4.7 07/07/2020 0827   CL 101 07/07/2020 0827   CO2 33 (H) 07/07/2020 0827   BUN 19 07/07/2020 0827   BUN 15 04/19/2020 1146   CREATININE 1.71 (H) 07/07/2020 0827   GLU 119 02/02/2020 0000      Component Value Date/Time   CALCIUM 9.6 07/07/2020 0827   CALCIUM 9.1 04/23/2011 0820   ALKPHOS 90 05/19/2020 0958   AST 20 05/19/2020 0958   ALT 16 05/19/2020 0958   BILITOT 1.6 (H) 05/19/2020 0958     Lab Results  Component Value Date   HGBA1C 6.5 07/07/2020    IMPRESSION AND PLAN:  1) Acute sinusitis: keflex 500 mg tid x 10d.  2) CRI III: following sCr as we have transitioned him OFF lasix ONTO farxiga 86m qd.  3) CHF: he has stayed at baseline level of max exertion (able to go up a flight of stairs) since transition from lasix to f[place]   Cont BB, entresto, and farxiga.  Avoiding spironolactone b/c borderline hyperkalemia.   New dx DM 2  4) Borderline DM 2:  a1c rose from 6.2% to 6.5% over the last 6 mo. Just started farxiga 163mqd as noted above.  Next a1c approx 3 mo  An After Visit Summary was printed and given to the patient.  FOLLOW UP: No follow-ups on file.  Signed:  PhCrissie SicklesMD           07/26/2020

## 2020-09-04 NOTE — Progress Notes (Signed)
ADVANCED HF CLINIC  NOTE  Referring Physician: Dr. Fletcher Anon Primary Care: [name], MD  Nephrology: [place] ([facility]) Primary Cardiologist: Johnsie Cancel HF Cardiologist: Dr. Haroldine Laws  HPI:  Roy Dyer a 55 y.o.malewith a severe premature CAD, SLE s/p renal transplant 2012 ([facility]), OSA on CPAP and systolic HF EF 16%  His first MI was in 2001 with stenting to the RCA and OM. Had BMS to LAD in 2012. Catheterization 9/18 showed 90% lesion in mRCA -> DES, LADand OM stents patent.   Echocardiogram 12/17 EF 30%  Echo 4/21 EF 25- 30% with severe LAE with mild MR and moderate TR.   Cath 12/21 for worsening HF symptoms. Stable CAD. With frequent PVCs. No RHC  Zio 12/21: PVCs 13.4%  Here with his wife. Feeling better. Now back to the office. Able to do more activity. Gets occasional PVCs but much better. Occasional nausea/vomiting with mexilitene. Typically about once a week. Off lasix. On Farxiga 10 gets dizzy when he stands at times. No edema, orthopnea or PND. Weight stable at 216  ROS:  Please see the history of present illness.   All other systems are personally reviewed and negative.   Past Medical History:  Diagnosis Date  . AICD (automatic cardioverter/defibrillator) present    Dr. Paschal Dopp follows remotely-yrly checks, Dr. Jonne Ply  . Avascular necrosis of bone of hip ([place]) 2010 surg   Left hip arthroplasty: from chronic systemic steroids taken for his Lupus  . Barrett's esophagus 2017; 2021  . Blood transfusion without reported diagnosis 2010, 2012  . CAD (coronary artery disease)    **DAPT long term recommended by cards**.  a. stents RCA/Circ 2001 b. BMS to LAD 07/2010  c. 01/2017: s/p DES to RCA.   . Cataract    left eye  . CHF (congestive heart failure) ([place])   . Chronic headaches 02/2017   As of 01/2017, HA's no better, neurologist ordered CT.  ESR normal at that time.  HA'S COMPLETELY RESOLVED AFTER HE GOT ON CPAP 2019/2020.  [name] Chronic renal  insufficiency, stage 3 (moderate) (HCC)    GFR 55-60  . Erythrocytosis    Pre and post transplant->on losartan for this  . GERD (gastroesophageal reflux disease)    Hx of esoph stricture and dilatation.  +Barrett's esophagus+hx of aspiration pneumonia-->to get rpt EGD when he comes off DAPT (utd as of 01/29/18)  . Gout    s/p renal transplant he was weened off of his allopurinol.  . Hiatal hernia   . History of adenomatous polyp of colon 09/2019   polyp x 1. Recall 5 yrs  . 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 History of renal transplant 11/10/2012   10/2012 [facility]   . Hyperlipidemia   . Hypertension   . implantable cardiac defibrillator single chamber    Medtronic (due to low EF)  . Ischemic cardiomyopathy    Chronic systolic dysfunction--  96/7893 EF 30-35%.   Single chamber ICD 11/20/10 ([name])  . Left ventricular thrombus 2012   Re-eval 02/2011 showed thrombus RESOLVED, so coumadin was d/c'd (was on it for 7mo  . [place]   . OSA on CPAP 2018   Dr. [name]9/2018: +OSA on home sleep studay; CPAP auto titrate 5-20 started 05/2017.  . Osteoporosis   . Prediabetes 12/2019   A1c stable long term at 5.6-5.7 until jump up to 6.2% Aug 2021.    Current Meds  Medication Sig  . acetaminophen (TYLENOL) 500 MG tablet Take 1,000 mg by mouth every 6 (six) hours as needed (for pain.).  [name] allopurinol (ZYLOPRIM) 100 MG tablet Take 1 tablet (100 mg total) by mouth daily.  [name] aspirin EC 81 MG tablet Take 81 mg by mouth daily.  [name] atorvastatin (LIPITOR) 80 MG tablet Take 80 mg by mouth at bedtime.  [name] BYSTOLIC 5 MG tablet TAKE 1 TABLET BY MOUTH DAILY  . clopidogrel (PLAVIX) 75 MG tablet TAKE 1 TABLET BY MOUTH DAILY  . DEXILANT 60 MG capsule TAKE 1 CAPSULE BY MOUTH DAILY BEFORE BREAKFAST  . famotidine (PEPCID) 20 MG tablet Take 40 mg by mouth at bedtime.   [name] FARXIGA 10 MG TABS tablet TAKE 1 TABLET BY MOUTH DAILY BEFORE  BREAKFAST.  . fluticasone (FLONASE) 50 MCG/ACT nasal spray Place 1 spray into both nostrils daily.  . magnesium oxide (MAG-OX) 400 MG tablet Take 400 mg by mouth daily.   [name] mexiletine (MEXITIL) 150 MG capsule Take 2 capsules (300 mg total) by mouth 2 (two) times daily.  . mycophenolate (CELLCEPT) 250 MG capsule Take 3 capsules (750 mg total) by mouth 2 (two) times daily.  . niacin 500 MG tablet Take 1,000 mg by mouth at bedtime.  . nitroGLYCERIN (NITROSTAT) 0.4 MG SL tablet Place 1 tablet (0.4 mg total) under the tongue every 5 (five) minutes as needed for chest pain (DO NOT EXCEED 3 DOSES).  . potassium chloride SA (KLOR-CON) 20 MEQ tablet Take 1 tablet (20 mEq total) by mouth daily.  . predniSONE (DELTASONE) 5 MG tablet TAKE 1 TABLET BY MOUTH DAILY  . sacubitril-valsartan (ENTRESTO) 49-51 MG Take 1 tablet by mouth 2 (two) times daily.  . tacrolimus (PROGRAF) 0.5 MG capsule Take 0.5 mg by mouth every 12 (twelve) hours.   . traMADol (ULTRAM) 50 MG tablet Take 50 mg by mouth every 6 (six) hours as needed. for pain          Allergies  Allergen Reactions  . Triptans Palpitations    Reaction to Maxalt     Social History        Socioeconomic History  . Marital status: Married    Spouse name: Not on file  . Number of children: 2  . Years of education: Not on file  . Highest education level: Not on file  Occupational History  . Occupation: CAD Drafter     Employer: [employer]  Tobacco Use  . Smoking status: Former Smoker    Packs/day: 0.50    Years: 30.00    Pack years: 15.00    Types: Cigarettes    Quit date: 08/02/2010    Years since quitting: 9.7  . Smokeless tobacco: Never Used  Vaping Use  . Vaping Use: Never used  Substance and Sexual Activity  . Alcohol use: No    Alcohol/week: 0.0 standard drinks  . Drug use: No  . Sexual activity: Yes    Partners: Female  Other Topics Concern  . Not on file  Social History Narrative   Married, 1 teenage son  and 1 teenage daughter.    Occupation: Printmaker.   15 pack-yr smoking hx, quit 07/2010.   Drug Use - no   No alcohol.            Social Determinants of Health   Financial Resource Strain: Not on file  Food Insecurity: Not on file  Transportation Needs: Not on file  Physical Activity: Not on file  Stress: Not on  file  Social Connections: Not on file  Intimate Partner Violence: Not on file          Family History  Problem Relation Age of Onset  . Kidney failure Mother   . Lupus Mother   . Stroke Mother   . Diabetes Mother        type 2  . Heart attack Father        X 7  . Hypertension Father   . Hyperlipidemia Father   . Heart attack Sister 31       X 1  . Hyperlipidemia Sister   . Hypertension Sister   . Heart disease Sister   . Heart attack Paternal Grandfather   . Heart disease Paternal Aunt   . Heart disease Paternal Uncle   . Colon cancer Neg Hx   . Esophageal cancer Neg Hx   . Stomach cancer Neg Hx   . Rectal cancer Neg Hx    Vitals:   09/07/20 1005  BP: 118/70  Pulse: 61  SpO2: 97%  Weight: 100.2 kg (221 lb)    Wt Readings from Last 3 Encounters:  07/26/20 101.4 kg (223 lb 9.6 oz)  07/07/20 102.2 kg (225 lb 3.2 oz)  06/09/20 103.1 kg (227 lb 3.2 oz)     PHYSICAL EXAM: General:  Well appearing. No resp difficulty HEENT: normal Neck: supple. no JVD. Carotids 2+ bilat; no bruits. No lymphadenopathy or thryomegaly appreciated. Cor: PMI nondisplaced. Regular rate & rhythm. No rubs, gallops or murmurs. Lungs: clear Abdomen: soft, nontender, nondistended. No hepatosplenomegaly. No bruits or masses. Good bowel sounds. Extremities: no cyanosis, clubbing, rash, edema Neuro: alert & orientedx3, cranial nerves grossly intact. moves all 4 extremities w/o difficulty. Affect pleasant   ECG: NSR 56 bpm, 1st AV block (252 ms), no PVCs (Personally reviewed).  ICD interrogation: Optivol low. No VT. Activity  3hr/day Personally reviewed   ASSESSMENT & PLAN:  1. Chronic systolic heart failure due to iCM - Echo 12/17 with LVEF 30% s/pICD. - Echo 4/21 EF of 25 to 30% with severe LAE with mild MR and moderate TR. - Cath 04/22/20 for worsening HF symptoms. Stable CAD. - Much improved NYHA II - Volume status looks good. May be a bit dry with orthostasis - Continue Farxiga 10 daily. Can liberalize salt intake to deal with orthostasis - Continue Entresto 49/51 bid. - Continue Bystolic 5 mg daily.  - recent K 5.1. No MRA. Stop Kcl - repeat labs - Repeat zio make sure PVcs suppressed. Then repeat echo 1 month  2. CAD with prior MI x2 and ischemic cardiomyopathy - Multiple stents to LAD, RCA and LCX - Cath 04/22/20 for worsening HF symptoms.  - No s/s angina - Continue DAPT, statin.  3. CKD 3a s/p renal transplant in 2012 (due to SLE): - Follows with nephrology, Dr. Clover Mealy and Dr. Lissa Merlin at [facility]. - Creatinine 1.2-1.6 on 04/16/2020 per patient report at renal transplant office at [facility] which appears to be around his more recent baseline. - Continue prednisone, Prograf and CellCept. - Labs today  4. Frequent PVCs - Zio 12/21 13.4% PVC burden on Zio with runs of SVT & NSVT. - PVCs seem reduced on mexilitene 300 mg bid.  - Repeat Zio to requantify   [name], MD  8:07 PM

## 2020-09-06 ENCOUNTER — Other Ambulatory Visit: Payer: Self-pay | Admitting: Family Medicine

## 2020-09-07 ENCOUNTER — Other Ambulatory Visit: Payer: Self-pay

## 2020-09-07 ENCOUNTER — Ambulatory Visit (HOSPITAL_COMMUNITY)
Admission: RE | Admit: 2020-09-07 | Discharge: 2020-09-07 | Disposition: A | Discharge status: Home / Self Care | Payer: Managed Care, Other (non HMO) | Source: Ambulatory Visit | Attending: Internal Medicine | Admitting: Internal Medicine

## 2020-09-07 ENCOUNTER — Encounter (HOSPITAL_COMMUNITY): Payer: Self-pay | Admitting: Internal Medicine

## 2020-09-07 ENCOUNTER — Other Ambulatory Visit (HOSPITAL_COMMUNITY): Payer: Self-pay | Admitting: Internal Medicine

## 2020-09-07 VITALS — BP 118/70 | HR 61 | Wt 221.0 lb

## 2020-09-07 DIAGNOSIS — Z87891 Personal history of nicotine dependence: Secondary | ICD-10-CM | POA: Insufficient documentation

## 2020-09-07 DIAGNOSIS — I5022 Chronic systolic (congestive) heart failure: Secondary | ICD-10-CM | POA: Diagnosis present

## 2020-09-07 DIAGNOSIS — Z95810 Presence of automatic (implantable) cardiac defibrillator: Secondary | ICD-10-CM | POA: Insufficient documentation

## 2020-09-07 DIAGNOSIS — I2510 Atherosclerotic heart disease of native coronary artery without angina pectoris: Secondary | ICD-10-CM | POA: Diagnosis not present

## 2020-09-07 DIAGNOSIS — M329 Systemic lupus erythematosus, unspecified: Secondary | ICD-10-CM | POA: Insufficient documentation

## 2020-09-07 DIAGNOSIS — G4733 Obstructive sleep apnea (adult) (pediatric): Secondary | ICD-10-CM | POA: Insufficient documentation

## 2020-09-07 DIAGNOSIS — I252 Old myocardial infarction: Secondary | ICD-10-CM | POA: Diagnosis not present

## 2020-09-07 DIAGNOSIS — Z7902 Long term (current) use of antithrombotics/antiplatelets: Secondary | ICD-10-CM | POA: Insufficient documentation

## 2020-09-07 DIAGNOSIS — I255 Ischemic cardiomyopathy: Secondary | ICD-10-CM | POA: Diagnosis not present

## 2020-09-07 DIAGNOSIS — Z7982 Long term (current) use of aspirin: Secondary | ICD-10-CM | POA: Diagnosis not present

## 2020-09-07 DIAGNOSIS — Z8249 Family history of ischemic heart disease and other diseases of the circulatory system: Secondary | ICD-10-CM | POA: Insufficient documentation

## 2020-09-07 DIAGNOSIS — Z955 Presence of coronary angioplasty implant and graft: Secondary | ICD-10-CM | POA: Diagnosis not present

## 2020-09-07 DIAGNOSIS — Z7952 Long term (current) use of systemic steroids: Secondary | ICD-10-CM | POA: Diagnosis not present

## 2020-09-07 DIAGNOSIS — I493 Ventricular premature depolarization: Secondary | ICD-10-CM

## 2020-09-07 DIAGNOSIS — Z8349 Family history of other endocrine, nutritional and metabolic diseases: Secondary | ICD-10-CM | POA: Insufficient documentation

## 2020-09-07 DIAGNOSIS — Z940 Kidney transplant status: Secondary | ICD-10-CM | POA: Diagnosis not present

## 2020-09-07 DIAGNOSIS — Z79899 Other long term (current) drug therapy: Secondary | ICD-10-CM | POA: Diagnosis not present

## 2020-09-07 DIAGNOSIS — Z7984 Long term (current) use of oral hypoglycemic drugs: Secondary | ICD-10-CM | POA: Diagnosis not present

## 2020-09-07 DIAGNOSIS — Z9989 Dependence on other enabling machines and devices: Secondary | ICD-10-CM | POA: Diagnosis not present

## 2020-09-07 DIAGNOSIS — N1831 Chronic kidney disease, stage 3a: Secondary | ICD-10-CM | POA: Diagnosis not present

## 2020-09-07 LAB — CBC
HCT: 53.4 % — ABNORMAL HIGH (ref 39.0–52.0)
Hemoglobin: 17.1 g/dL — ABNORMAL HIGH (ref 13.0–17.0)
MCH: 30.6 pg (ref 26.0–34.0)
MCHC: 32 g/dL (ref 30.0–36.0)
MCV: 95.5 fL (ref 80.0–100.0)
Platelets: 197 10*3/uL (ref 150–400)
RBC: 5.59 MIL/uL (ref 4.22–5.81)
RDW: 14 % (ref 11.5–15.5)
WBC: 10.2 10*3/uL (ref 4.0–10.5)
nRBC: 0 % (ref 0.0–0.2)

## 2020-09-07 LAB — BASIC METABOLIC PANEL
Anion gap: 6 (ref 5–15)
BUN: 14 mg/dL (ref 6–20)
CO2: 29 mmol/L (ref 22–32)
Calcium: 9.2 mg/dL (ref 8.9–10.3)
Chloride: 105 mmol/L (ref 98–111)
Creatinine, Ser: 1.46 mg/dL — ABNORMAL HIGH (ref 0.61–1.24)
GFR, Estimated: 57 mL/min — ABNORMAL LOW (ref >60)
Glucose, Bld: 127 mg/dL — ABNORMAL HIGH (ref 70–99)
Potassium: 4.9 mmol/L (ref 3.5–5.1)
Sodium: 140 mmol/L (ref 135–145)

## 2020-09-07 LAB — BRAIN NATRIURETIC PEPTIDE: B Natriuretic Peptide: 792.8 pg/mL — ABNORMAL HIGH (ref 0.0–100.0)

## 2020-09-07 NOTE — Patient Instructions (Signed)
Stop Potassium   Labs done today, your results will be available in MyChart, we will contact you for abnormal readings.  Your provider has recommended that  you wear a Zio Patch for 14 days.  This monitor will record your heart rhythm for our review.  IF you have any symptoms while wearing the monitor please press the button.  If you have any issues with the patch or you notice a red or orange light on it please call the company at [phone].  Once you remove the patch please mail it back to the company as soon as possible so we can get the results.  Your physician has requested that you have an echocardiogram. Echocardiography is a painless test that uses sound waves to create images of your heart. It provides your doctor with information about the size and shape of your heart and how well your heart's chambers and valves are working. This procedure takes approximately one hour. There are no restrictions for this procedure.  Your physician recommends that you schedule a follow-up appointment in: 4 months  If you have any questions or concerns before your next appointment please send [place] a message through [place] or call our office at [phone].    TO LEAVE A MESSAGE FOR THE NURSE SELECT OPTION 2, PLEASE LEAVE A MESSAGE INCLUDING: . YOUR NAME . DATE OF BIRTH . CALL BACK NUMBER . REASON FOR CALL**this is important as we prioritize the call backs  [place] AS LONG AS YOU CALL BEFORE 4:00 PM   At the [facility], you and your health needs are our priority. As part of our continuing mission to provide you with exceptional heart care, we have created designated Provider Care Teams. These Care Teams include your primary Cardiologist (physician) and Advanced Practice Providers (APPs- Physician Assistants and Nurse Practitioners) who all work together to provide you with the care you need, when you need it.   You may see any of the following  providers on your designated Care Team at your next follow up: [name] Dr [name] . Dr Loralie Champagne . Dr Vickki Muff . Darrick Grinder, NP . Lyda Jester, [place] . Audry Riles, PharmD   Please be sure to bring in all your medications bottles to every appointment.

## 2020-09-19 NOTE — Progress Notes (Signed)
Electrophysiology Office Note Date: 09/20/2020  ID:  Roy, Dyer 1965/06/22, MRN [id number]  PCP: [name], MD Primary Cardiologist: [name], MD Electrophysiologist: [name]  CC: Routine ICD follow-up  Roy Dyer is a 55 y.o. male seen today for [name] for routine electrophysiology followup.  Since last being seen in our clinic the patient reports doing very well. He feels like his SOB and energy levels have both improved since being followed by CHF clinic.  he denies chest pain, palpitations, dyspnea, PND, orthopnea, nausea, vomiting, dizziness, syncope, edema, weight gain, or early satiety. He has not had ICD shocks.   Device History: MDT single chamberICD implanted2054fr ICM , gen change 03/2018 History of appropriate therapy:No History of AAD therapy:No  Past Medical History:  Diagnosis Date  . AICD (automatic cardioverter/defibrillator) present    [name] remotely-yrly checks, [name] . Avascular necrosis of bone of hip ([place] surg   Left hip arthroplasty: from chronic systemic steroids taken for his Lupus  . Barrett's esophagus 2017; 2021  . Blood transfusion without reported diagnosis 2010, 2012  . CAD (coronary artery disease)    **DAPT long term recommended by cards**.  a. stents RCA/Circ 2001 b. BMS to LAD 07/2010  c. 01/2017: s/p DES to RCA.   . Cataract    left eye  . CHF (congestive heart failure) ([place]   . Chronic headaches 02/2017   As of 01/2017, HA's no better, neurologist ordered CT.  ESR normal at that time.  HA'S COMPLETELY RESOLVED AFTER HE GOT ON CPAP 2019/2020.  .Marland KitchenChronic renal insufficiency, stage 3 (moderate) (HCC)    GFR 55-60  . Erythrocytosis    Pre and post transplant->on losartan for this  . GERD (gastroesophageal reflux disease)    Hx of esoph stricture and dilatation.  +Barrett's esophagus+hx of aspiration pneumonia-->to get rpt EGD when he comes off DAPT (utd as of 01/29/18)  . Gout     s/p renal transplant he was weened off of his allopurinol.  . Hiatal hernia   . History of adenomatous polyp of colon 09/2019   polyp x 1. Recall 5 yrs  . 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 [facility]10/2012   [facility]  . Hyperlipidemia   . Hypertension   . implantable cardiac defibrillator single chamber    Medtronic (due to low EF)  . Ischemic cardiomyopathy    Chronic systolic dysfunction--  146/6599EF 30-35%.   Single chamber ICD 11/20/10 (Dr. KCaryl Comes  . Left ventricular thrombus 2012   Re-eval 02/2011 showed thrombus RESOLVED, so coumadin was d/c'd (was on it for 621mo . Lupus ([place]  . OSA on CPAP 2018   Dr. YoAnnamaria Boots/2018: +OSA on home sleep studay; CPAP auto titrate 5-20 started 05/2017.  . Osteoporosis   . Prediabetes 12/2019   A1c stable long term at 5.6-5.7 until jump up to 6.2% Aug 2021.   Past Surgical History:  Procedure Laterality Date  . AV FISTULA PLACEMENT  03/28/2011   Procedure: ARTERIOVENOUS (AV) FISTULA CREATION;  Surgeon: ToRosetta PosnerMD;  [place] [facility] [place] Service: [facility];  Laterality: Left;  Creation of Left radiocephallic cimino fistula  . AV FISTULA PLACEMENT  04/27/2011   Procedure: ARTERIOVENOUS (AV) FISTULA CREATION;  Surgeon: ToRosetta PosnerMD;  [place] [facility] Service: V[facility];  Laterality: Left;  left  basilic vein transposition  . BIOPSY  09/14/2019   Procedure: BIOPSY;  Surgeon: Jerene Bears, MD;  [facility];  Service: Gastroenterology;;  . CARDIAC CATHETERIZATION  08/2010; 04/22/20   08/2010; 04/2020->mild/mod in-stent restenosis but none hemodynamically signif, severe LV dysfxn, frequenct PVCs-->referred to HF clinic for optimization of med mgmt  . CARDIOVASCULAR STRESS TEST  07/2010; 03/2014   03/2014 showed large old infarct and EF 30-35% but no ischemia  . COLONOSCOPY  09/14/2019   Adenoma x 1.  Recall 5 yrs  . COLONOSCOPY WITH  PROPOFOL N/A 09/14/2019   Procedure: COLONOSCOPY WITH PROPOFOL;  Surgeon: Jerene Bears, MD;  [facility];  Service: Gastroenterology;  Laterality: N/A;  . CORONARY PRESSURE WIRE/FFR WITH 3D MAPPING N/A 04/22/2020   Procedure: Coronary Pressure Wire/FFR w/3D Mapping;  Surgeon: Wellington Hampshire, MD;  Location: [place] [facility];  Service: Cardiovascular;  Laterality: N/A;  . DEXA  07/05/2017   Normal bone density in radius and spine.  . ESOPHAGOGASTRODUODENOSCOPY  02/2009   Done by Dr. Fuller Plan for hematemesis; esophagitis found.  Repeat recommended 09/2016, at which time he will also likely get his initial screening colonoscopy.  . ESOPHAGOGASTRODUODENOSCOPY  09/14/2019   esophagitis (improved compared to 2017 EGD), Barrett's.  Stomach and duodenum normal.  . ESOPHAGOGASTRODUODENOSCOPY (EGD) WITH PROPOFOL N/A 09/29/2015   REPEAT EGD RECOMMENDED 09/2016.  Barrett's esoph + mild chronic gastritis.  H pylori NEG. Procedure: ESOPHAGOGASTRODUODENOSCOPY (EGD) WITH PROPOFOL;  Surgeon: Jerene Bears, MD;  [facility];  Service: Gastroenterology;  Laterality: N/A;  . ESOPHAGOGASTRODUODENOSCOPY (EGD) WITH PROPOFOL N/A 09/14/2019   Procedure: ESOPHAGOGASTRODUODENOSCOPY (EGD) WITH PROPOFOL;  Surgeon: Jerene Bears, MD;  [facility];  Service: Gastroenterology;  Laterality: N/A;  . ICD GENERATOR CHANGEOUT N/A 04/11/2018   Procedure: ICD GENERATOR CHANGEOUT;  Surgeon: Deboraha Sprang, MD;  Location: [place] [facility];  Service: Cardiovascular;  Laterality: N/A;  . INSERT / REPLACE / REMOVE PACEMAKER     medtronic        dr [name]    [place]   icd only  . KIDNEY TRANSPLANT  10/2012   [facility] nephrologist--Dr. Blair Heys  . LEFT HEART CATH AND CORONARY ANGIOGRAPHY N/A 02/08/2017   PCA and DES to mRCA--needs DAPT for at least 6 mo.   Procedure: LEFT HEART CATH AND CORONARY ANGIOGRAPHY;  Surgeon: Sherren Mocha, MD;  Location: [place] [facility];  Service: Cardiovascular;  Laterality:  N/A;  . LEFT HEART CATH AND CORONARY ANGIOGRAPHY N/A 04/22/2020   Procedure: LEFT HEART CATH AND CORONARY ANGIOGRAPHY;  Surgeon: Wellington Hampshire, MD;  Location: [place] [facility];  Service: Cardiovascular;  Laterality: N/A;  . PARTIAL HIP ARTHROPLASTY Left 2010   left  . POLYPECTOMY  09/14/2019   Procedure: POLYPECTOMY;  Surgeon: Jerene Bears, MD;  [facility];  Service: Gastroenterology;;  . RENAL BIOPSY    . stents     05-2010 and 2- in 2010 and 1 in 2018.  [name] TOTAL HIP ARTHROPLASTY  07/09/2011   Procedure: TOTAL HIP ARTHROPLASTY;  Surgeon: Kerin Salen, MD;  Location: [place];  Service: Orthopedics;  Laterality: Right;  . TYMPANOSTOMY TUBE PLACEMENT  55 yrs old  . US ECHOCARDIOGRAPHY  12/2010; 02/2014;08/2014;08/2019   02/2014 EF still 25-30%, increased PA pressures.  2016 EF 30-35%, sept/inf hypokinesis, mod tricusp regurg. 08/2019 EF 25-30%, global hypok, sev dil LV w/apical aneur, grd III DD, mod tricus reg, mod pulm htn  . ZIO [facility]  04/2020  HIGH PVC BURDEN.  NSVT, SVT, BBB.    Current Outpatient Medications  Medication Sig Dispense Refill  . acetaminophen (TYLENOL) 500 MG tablet Take 1,000 mg by mouth every 6 (six) hours as needed (for pain.).    [name] allopurinol (ZYLOPRIM) 100 MG tablet Take 1 tablet (100 mg total) by mouth daily. 90 tablet 3  . aspirin EC 81 MG tablet Take 81 mg by mouth daily.    [name] atorvastatin (LIPITOR) 80 MG tablet Take 80 mg by mouth at bedtime.    [name] BYSTOLIC 5 MG tablet TAKE 1 TABLET BY MOUTH DAILY 90 tablet 3  . clopidogrel (PLAVIX) 75 MG tablet TAKE 1 TABLET BY MOUTH DAILY 90 tablet 3  . DEXILANT 60 MG capsule TAKE 1 CAPSULE BY MOUTH DAILY BEFORE BREAKFAST 90 capsule 0  . famotidine (PEPCID) 20 MG tablet Take 40 mg by mouth at bedtime.     [name] FARXIGA 10 MG TABS tablet TAKE 1 TABLET BY MOUTH DAILY BEFORE BREAKFAST. 90 tablet 1  . fluticasone (FLONASE) 50 MCG/ACT nasal spray Place 1 spray into both nostrils daily.    . magnesium oxide (MAG-OX) 400 MG  tablet Take 400 mg by mouth daily.     [name] mexiletine (MEXITIL) 150 MG capsule Take 2 capsules (300 mg total) by mouth 2 (two) times daily. 120 capsule 6  . mycophenolate (CELLCEPT) 250 MG capsule Take 3 capsules (750 mg total) by mouth 2 (two) times daily. 540 capsule 1  . niacin 500 MG tablet Take 1,000 mg by mouth at bedtime.    . nitroGLYCERIN (NITROSTAT) 0.4 MG SL tablet Place 1 tablet (0.4 mg total) under the tongue every 5 (five) minutes as needed for chest pain (DO NOT EXCEED 3 DOSES). 25 tablet 3  . predniSONE (DELTASONE) 5 MG tablet TAKE 1 TABLET BY MOUTH DAILY 90 tablet 3  . sacubitril-valsartan (ENTRESTO) 49-51 MG Take 1 tablet by mouth 2 (two) times daily. 60 tablet 6  . tacrolimus (PROGRAF) 0.5 MG capsule Take 0.5 mg by mouth every 12 (twelve) hours.     . traMADol (ULTRAM) 50 MG tablet Take 50 mg by mouth every 6 (six) hours as needed. for pain  0   No current facility-administered medications for this visit.    Allergies:   Triptans   Social History: Social History   Socioeconomic History  . Marital status: Married    Spouse name: Not on file  . Number of children: 2  . Years of education: Not on file  . Highest education level: Not on file  Occupational History  . Occupation: CAD Drafter     Employer: [employer]  Tobacco Use  . Smoking status: Former Smoker    Packs/day: 0.50    Years: 30.00    Pack years: 15.00    Types: Cigarettes    Quit date: 08/02/2010    Years since quitting: 10.1  . Smokeless tobacco: Never Used  Vaping Use  . Vaping Use: Never used  Substance and Sexual Activity  . Alcohol use: No    Alcohol/week: 0.0 standard drinks  . Drug use: No  . Sexual activity: Yes    Partners: Female  Other Topics Concern  . Not on file  Social History Narrative   Married, 1 teenage son and 1 teenage daughter.    Occupation: Printmaker.   15 pack-yr smoking hx, quit 07/2010.   Drug Use - no   No alcohol.            Social  Determinants of Health    Financial Resource Strain: Not on file  Food Insecurity: Not on file  Transportation Needs: Not on file  Physical Activity: Not on file  Stress: Not on file  Social Connections: Not on file  Intimate Partner Violence: Not on file    Family History: Family History  Problem Relation Age of Onset  . Kidney failure Mother   . Lupus Mother   . Stroke Mother   . Diabetes Mother        type 2  . Heart attack Father        X 7  . Hypertension Father   . Hyperlipidemia Father   . Heart attack Sister 33       X 1  . Hyperlipidemia Sister   . Hypertension Sister   . Heart disease Sister   . Heart attack Paternal Grandfather   . Heart disease Paternal Aunt   . Heart disease Paternal Uncle   . Colon cancer Neg Hx   . Esophageal cancer Neg Hx   . Stomach cancer Neg Hx   . Rectal cancer Neg Hx     Review of Systems: All other systems reviewed and are otherwise negative except as noted above.   Physical Exam: Vitals:   09/20/20 0821  BP: 124/70  Pulse: 67  SpO2: 96%  Weight: 223 lb (101.2 kg)  Height: 5' 7"  (1.702 m)     GEN- The patient is well appearing, alert and oriented x 3 today.   HEENT: normocephalic, atraumatic; sclera clear, conjunctiva pink; hearing intact; oropharynx clear; neck supple, no JVP Lymph- no cervical lymphadenopathy Lungs- Clear to ausculation bilaterally, normal work of breathing.  No wheezes, rales, rhonchi Heart- Regular rate and rhythm, no murmurs, rubs or gallops, PMI not laterally displaced GI- soft, non-tender, non-distended, bowel sounds present, no hepatosplenomegaly Extremities- no clubbing or cyanosis. No edema; DP/PT/radial pulses 2+ bilaterally MS- no significant deformity or atrophy Skin- warm and dry, no rash or lesion; ICD pocket well healed Psych- euthymic mood, full affect Neuro- strength and sensation are intact  ICD interrogation- reviewed in detail today,  See PACEART report  EKG:  EKG is not ordered today.  Recent  Labs: 01/06/2020: TSH 2.04 04/26/2020: Magnesium 1.7 05/19/2020: ALT 16 09/07/2020: B Natriuretic Peptide 792.8; BUN 14; Creatinine, Ser 1.46; Hemoglobin 17.1; Platelets 197; Potassium 4.9; Sodium 140   Wt Readings from Last 3 Encounters:  09/20/20 223 lb (101.2 kg)  09/07/20 221 lb (100.2 kg)  07/26/20 223 lb 9.6 oz (101.4 kg)     Other studies Reviewed: Additional studies/ records that were reviewed today include: Previous EP and HF nots   Assessment and Plan:  1.  Chronic systolic dysfunction s/p Medtronic single chamber ICD  NYHA II symptoms Volume status stable on exam Stable on an appropriate medical regimen; Followed by CHF team Normal ICD function See Pace Art report No changes today Narrow QRS. Not candidate for CRT. Can consider barostim down the road if symptoms worsen.  2. NSVT / PVCs Zio pending for burden. Repeat echo also pending with PVC suppression Remains on mexilitene 300 mg BID  3. CAD Denies s/s ischemia Continue DAPT and statin  4. CKDIII Follow closely. Stable labs 09/07/20  Current medicines are reviewed at length with the patient today.   The patient does not have concerns regarding his medicines.  The following changes were made today:  none  Labs/ tests ordered today include:  No orders of the defined types were placed in  this encounter.  Disposition:   Follow up with [name] with 12 months. Sooner as needed or if PVCs need to be further addressed. (Pending monitor as above)   Signed, Shirley Friar, PA-C  09/20/2020 8:26 AM  [facility] [address] [place] [address] [phone] (office) [phone] (fax)

## 2020-09-20 ENCOUNTER — Other Ambulatory Visit: Payer: Self-pay

## 2020-09-20 ENCOUNTER — Encounter: Payer: Self-pay | Admitting: Student

## 2020-09-20 ENCOUNTER — Ambulatory Visit: Payer: Managed Care, Other (non HMO) | Admitting: Student

## 2020-09-20 VITALS — BP 124/70 | HR 67 | Ht 67.0 in | Wt 223.0 lb

## 2020-09-20 DIAGNOSIS — I5022 Chronic systolic (congestive) heart failure: Secondary | ICD-10-CM | POA: Diagnosis not present

## 2020-09-20 DIAGNOSIS — I2510 Atherosclerotic heart disease of native coronary artery without angina pectoris: Secondary | ICD-10-CM

## 2020-09-20 DIAGNOSIS — I493 Ventricular premature depolarization: Secondary | ICD-10-CM

## 2020-09-20 DIAGNOSIS — N1831 Chronic kidney disease, stage 3a: Secondary | ICD-10-CM | POA: Diagnosis not present

## 2020-09-20 LAB — CUP PACEART INCLINIC DEVICE CHECK
Battery Remaining Longevity: 121 mo
Battery Voltage: 2.98 V
Brady Statistic RV Percent Paced: 7.96 %
Date Time Interrogation Session: 20220510083650
HighPow Impedance: 49 Ohm
HighPow Impedance: 62 Ohm
Implantable Lead Implant Date: 20120709
Implantable Lead Location: 753860
Implantable Lead Model: 6947
Implantable Pulse Generator Implant Date: 20191129
Lead Channel Impedance Value: 285 Ohm
Lead Channel Impedance Value: 399 Ohm
Lead Channel Pacing Threshold Amplitude: 1.125 V
Lead Channel Pacing Threshold Pulse Width: 0.4 ms
Lead Channel Sensing Intrinsic Amplitude: 14.375 mV
Lead Channel Sensing Intrinsic Amplitude: 16.5 mV
Lead Channel Setting Pacing Amplitude: 2.5 V
Lead Channel Setting Pacing Pulse Width: 0.5 ms
Lead Channel Setting Sensing Sensitivity: 0.3 mV

## 2020-09-20 MED ORDER — DAPAGLIFLOZIN PROPANEDIOL 10 MG PO TABS
10.0000 mg | ORAL_TABLET | Freq: Every day | ORAL | 0 refills | Status: DC
Start: 2020-09-20 — End: 2021-02-08

## 2020-09-23 ENCOUNTER — Other Ambulatory Visit (HOSPITAL_COMMUNITY): Payer: Self-pay

## 2020-09-23 MED ORDER — ENTRESTO 49-51 MG PO TABS: 1.0000 | ORAL_TABLET | Freq: Two times a day (BID) | ORAL | 3 refills | Status: DC

## 2020-09-23 MED ORDER — MEXILETINE HCL 150 MG PO CAPS: 300.0000 mg | ORAL_CAPSULE | Freq: Two times a day (BID) | ORAL | 3 refills | Status: AC

## 2020-09-28 NOTE — Addendum Note (Signed)
Encounter addended by: Micki Riley, RN on: 09/28/2020 3:02 PM  Actions taken: Imaging Exam ended

## 2020-10-13 ENCOUNTER — Other Ambulatory Visit: Payer: Self-pay

## 2020-10-13 ENCOUNTER — Ambulatory Visit (HOSPITAL_COMMUNITY)
Admission: RE | Admit: 2020-10-13 | Discharge: 2020-10-13 | Disposition: A | Discharge status: Home / Self Care | Payer: Managed Care, Other (non HMO) | Source: Ambulatory Visit | Attending: Internal Medicine | Admitting: Internal Medicine

## 2020-10-13 DIAGNOSIS — E785 Hyperlipidemia, unspecified: Secondary | ICD-10-CM | POA: Insufficient documentation

## 2020-10-13 DIAGNOSIS — I5022 Chronic systolic (congestive) heart failure: Secondary | ICD-10-CM | POA: Diagnosis present

## 2020-10-13 DIAGNOSIS — N186 End stage renal disease: Secondary | ICD-10-CM | POA: Insufficient documentation

## 2020-10-13 DIAGNOSIS — I2510 Atherosclerotic heart disease of native coronary artery without angina pectoris: Secondary | ICD-10-CM | POA: Insufficient documentation

## 2020-10-13 DIAGNOSIS — G4730 Sleep apnea, unspecified: Secondary | ICD-10-CM | POA: Insufficient documentation

## 2020-10-13 DIAGNOSIS — K219 Gastro-esophageal reflux disease without esophagitis: Secondary | ICD-10-CM | POA: Insufficient documentation

## 2020-10-13 DIAGNOSIS — I132 Hypertensive heart and chronic kidney disease with heart failure and with stage 5 chronic kidney disease, or end stage renal disease: Secondary | ICD-10-CM | POA: Diagnosis not present

## 2020-10-13 DIAGNOSIS — M3214 Glomerular disease in systemic lupus erythematosus: Secondary | ICD-10-CM | POA: Insufficient documentation

## 2020-10-13 DIAGNOSIS — I071 Rheumatic tricuspid insufficiency: Secondary | ICD-10-CM | POA: Insufficient documentation

## 2020-10-13 HISTORY — PX: TRANSTHORACIC ECHOCARDIOGRAM: SHX275

## 2020-10-13 LAB — ECHOCARDIOGRAM COMPLETE
Area-P 1/2: 3.68 cm2
S' Lateral: 5.3 cm

## 2020-10-13 MED ORDER — PERFLUTREN LIPID MICROSPHERE
1.0000 mL | INTRAVENOUS | Status: AC | PRN
  Administered 2020-10-13: 1 mL via INTRAVENOUS
  Filled 2020-10-13: qty 10

## 2020-10-13 NOTE — Progress Notes (Signed)
  Echocardiogram 2D Echocardiogram has been performed.  Genette Huertas G Glennice Marcos 10/13/2020, 9:15 AM

## 2020-11-03 ENCOUNTER — Other Ambulatory Visit (HOSPITAL_COMMUNITY): Payer: Self-pay | Admitting: *Deleted

## 2020-11-04 ENCOUNTER — Ambulatory Visit (HOSPITAL_COMMUNITY)
Admission: RE | Admit: 2020-11-04 | Discharge: 2020-11-04 | Disposition: A | Discharge status: Home / Self Care | Payer: Managed Care, Other (non HMO) | Source: Ambulatory Visit | Attending: Nephrology | Admitting: Nephrology

## 2020-11-04 ENCOUNTER — Other Ambulatory Visit: Payer: Self-pay

## 2020-11-04 DIAGNOSIS — D751 Secondary polycythemia: Secondary | ICD-10-CM | POA: Diagnosis present

## 2020-11-08 ENCOUNTER — Encounter: Payer: Self-pay | Admitting: Family Medicine

## 2020-11-08 LAB — CBC AND DIFFERENTIAL
HCT: 50 (ref 41–53)
HCT: 50 (ref 41–53)
Hemoglobin: 15.9 (ref 13.5–17.5)
Hemoglobin: 15.9 (ref 13.5–17.5)
Neutrophils Absolute: 6.6
Neutrophils Absolute: 6.6
Platelets: 211 (ref 150–399)
Platelets: 211 (ref 150–399)
WBC: 8.9
WBC: 8.9

## 2020-11-08 LAB — COMPREHENSIVE METABOLIC PANEL
Calcium: 9.5 (ref 8.7–10.7)
Calcium: 9.5 (ref 8.7–10.7)

## 2020-11-08 LAB — BASIC METABOLIC PANEL
BUN: 16 (ref 4–21)
BUN: 16 (ref 4–21)
CO2: 22 (ref 13–22)
CO2: 22 (ref 13–22)
Chloride: 102 (ref 99–108)
Chloride: 102 (ref 99–108)
Creatinine: 1.6 — AB (ref 0.6–1.3)
Creatinine: 1.6 — AB (ref 0.6–1.3)
Glucose: 119
Glucose: 119
Potassium: 5 (ref 3.4–5.3)
Potassium: 5 (ref 3.4–5.3)
Sodium: 140 (ref 137–147)
Sodium: 140 (ref 137–147)

## 2020-11-08 LAB — CBC
RBC: 5.53 — AB (ref 3.87–5.11)
RBC: 5.53 — AB (ref 3.87–5.11)

## 2020-11-30 ENCOUNTER — Ambulatory Visit: Payer: Managed Care, Other (non HMO) | Admitting: Family Medicine

## 2020-11-30 LAB — CUP PACEART REMOTE DEVICE CHECK
Battery Remaining Longevity: 119 mo
Battery Voltage: 3.01 V
Brady Statistic RV Percent Paced: 8.41 %
Date Time Interrogation Session: 20220720212117
HighPow Impedance: 46 Ohm
HighPow Impedance: 57 Ohm
Implantable Lead Implant Date: 20120709
Implantable Lead Location: 753860
Implantable Lead Model: 6947
Implantable Pulse Generator Implant Date: 20191129
Lead Channel Impedance Value: 285 Ohm
Lead Channel Impedance Value: 342 Ohm
Lead Channel Pacing Threshold Amplitude: 1.25 V
Lead Channel Pacing Threshold Pulse Width: 0.4 ms
Lead Channel Sensing Intrinsic Amplitude: 15.625 mV
Lead Channel Sensing Intrinsic Amplitude: 15.625 mV
Lead Channel Setting Pacing Amplitude: 2.5 V
Lead Channel Setting Pacing Pulse Width: 0.5 ms
Lead Channel Setting Sensing Sensitivity: 0.3 mV

## 2020-12-01 ENCOUNTER — Ambulatory Visit (INDEPENDENT_AMBULATORY_CARE_PROVIDER_SITE_OTHER): Payer: Managed Care, Other (non HMO)

## 2020-12-01 DIAGNOSIS — I255 Ischemic cardiomyopathy: Secondary | ICD-10-CM

## 2020-12-04 ENCOUNTER — Other Ambulatory Visit (HOSPITAL_COMMUNITY): Payer: Self-pay | Admitting: Family Medicine

## 2020-12-23 NOTE — Progress Notes (Signed)
Remote ICD transmission.   

## 2020-12-30 ENCOUNTER — Encounter (HOSPITAL_COMMUNITY): Payer: Self-pay | Admitting: Internal Medicine

## 2020-12-30 ENCOUNTER — Ambulatory Visit (HOSPITAL_COMMUNITY)
Admission: RE | Admit: 2020-12-30 | Discharge: 2020-12-30 | Disposition: A | Discharge status: Home / Self Care | Payer: Managed Care, Other (non HMO) | Source: Ambulatory Visit | Attending: Internal Medicine | Admitting: Internal Medicine

## 2020-12-30 ENCOUNTER — Other Ambulatory Visit: Payer: Self-pay

## 2020-12-30 VITALS — BP 110/68 | HR 62 | Wt 217.4 lb

## 2020-12-30 DIAGNOSIS — Z955 Presence of coronary angioplasty implant and graft: Secondary | ICD-10-CM | POA: Diagnosis not present

## 2020-12-30 DIAGNOSIS — Z7984 Long term (current) use of oral hypoglycemic drugs: Secondary | ICD-10-CM | POA: Insufficient documentation

## 2020-12-30 DIAGNOSIS — I2510 Atherosclerotic heart disease of native coronary artery without angina pectoris: Secondary | ICD-10-CM | POA: Insufficient documentation

## 2020-12-30 DIAGNOSIS — E875 Hyperkalemia: Secondary | ICD-10-CM | POA: Diagnosis not present

## 2020-12-30 DIAGNOSIS — N186 End stage renal disease: Secondary | ICD-10-CM | POA: Insufficient documentation

## 2020-12-30 DIAGNOSIS — I493 Ventricular premature depolarization: Secondary | ICD-10-CM | POA: Diagnosis not present

## 2020-12-30 DIAGNOSIS — I252 Old myocardial infarction: Secondary | ICD-10-CM | POA: Diagnosis not present

## 2020-12-30 DIAGNOSIS — I471 Supraventricular tachycardia: Secondary | ICD-10-CM | POA: Diagnosis not present

## 2020-12-30 DIAGNOSIS — I255 Ischemic cardiomyopathy: Secondary | ICD-10-CM | POA: Diagnosis not present

## 2020-12-30 DIAGNOSIS — Z95810 Presence of automatic (implantable) cardiac defibrillator: Secondary | ICD-10-CM | POA: Diagnosis not present

## 2020-12-30 DIAGNOSIS — I219 Acute myocardial infarction, unspecified: Secondary | ICD-10-CM | POA: Insufficient documentation

## 2020-12-30 DIAGNOSIS — Z79899 Other long term (current) drug therapy: Secondary | ICD-10-CM | POA: Insufficient documentation

## 2020-12-30 DIAGNOSIS — Z87891 Personal history of nicotine dependence: Secondary | ICD-10-CM | POA: Diagnosis not present

## 2020-12-30 DIAGNOSIS — Z7952 Long term (current) use of systemic steroids: Secondary | ICD-10-CM | POA: Insufficient documentation

## 2020-12-30 DIAGNOSIS — M3214 Glomerular disease in systemic lupus erythematosus: Secondary | ICD-10-CM | POA: Diagnosis not present

## 2020-12-30 DIAGNOSIS — I5022 Chronic systolic (congestive) heart failure: Secondary | ICD-10-CM | POA: Insufficient documentation

## 2020-12-30 DIAGNOSIS — Z940 Kidney transplant status: Secondary | ICD-10-CM | POA: Diagnosis not present

## 2020-12-30 DIAGNOSIS — I132 Hypertensive heart and chronic kidney disease with heart failure and with stage 5 chronic kidney disease, or end stage renal disease: Secondary | ICD-10-CM | POA: Insufficient documentation

## 2020-12-30 DIAGNOSIS — I472 Ventricular tachycardia: Secondary | ICD-10-CM | POA: Diagnosis not present

## 2020-12-30 DIAGNOSIS — Z7982 Long term (current) use of aspirin: Secondary | ICD-10-CM | POA: Diagnosis not present

## 2020-12-30 DIAGNOSIS — Z8249 Family history of ischemic heart disease and other diseases of the circulatory system: Secondary | ICD-10-CM | POA: Insufficient documentation

## 2020-12-30 DIAGNOSIS — N1831 Chronic kidney disease, stage 3a: Secondary | ICD-10-CM

## 2020-12-30 MED ORDER — SPIRONOLACTONE 25 MG PO TABS: 12.5000 mg | ORAL_TABLET | Freq: Every day | ORAL | 3 refills | Status: DC

## 2020-12-30 NOTE — Progress Notes (Signed)
ADVANCED HF CLINIC  NOTE   Referring Physician: Dr. Fletcher Anon Primary Care: [name], MD  Nephrology: [place] ([facility]) Primary Cardiologist: Johnsie Cancel HF Cardiologist: Dr. Haroldine Laws   HPI:   Roy Dyer is a 55 y.o. male with a severe premature CAD, SLE s/p renal transplant 2012 ([facility]), OSA on CPAP and systolic HF EF 14%   His first MI was in 2001 with stenting to the RCA and OM. Had BMS to LAD in 2012. Catheterization 9/18 showed 90% lesion in mRCA -> DES, LAD and OM stents patent.   Echocardiogram 12/17 EF 30%   Echo 4/21 EF 25- 30% with severe LAE with mild MR and moderate TR.    Cath 12/21 for worsening HF symptoms. Stable CAD. With frequent PVCs. No RHC  Zio 12/21: PVCs 13.4%  Echo 6/22 EF 30-35% Apex akinetic. G3 DD RV ok Personally reviewed   Here for routine follow-up. Says he feels pretty good. Saw Dr. Moshe Cipro recently and SCr up from 1.4 to 1.67. So he has been drinking a bit more fluid. Feeling ok. Dizziness improved. No orthopnea or PND. Can walk up steps without much problem. Only SOB with significant exertion.   ROS:  Please see the history of present illness.   All other systems are personally reviewed and negative.   Past Medical History:  Diagnosis Date   AICD (automatic cardioverter/defibrillator) present    Dr. Paschal Dopp follows remotely-yrly checks, Dr. Nishan,cardiology   Avascular necrosis of bone of hip ([place]) 2010 surg   Left hip arthroplasty: from chronic systemic steroids taken for his Lupus   Barrett's esophagus 2017; 2021   Blood transfusion without reported diagnosis 2010, 2012   CAD (coronary artery disease)    **DAPT long term recommended by cards**.  a. stents RCA/Circ 2001 b. BMS to LAD 07/2010  c. 01/2017: s/p DES to RCA.    Cataract    left eye   CHF (congestive heart failure) (HCC)    Chronic headaches 02/2017   As of 01/2017, HA's no better, neurologist ordered CT.  ESR normal at that time.  HA'S COMPLETELY RESOLVED AFTER  HE GOT ON CPAP 2019/2020.   Chronic renal insufficiency, stage 3 (moderate) (HCC)    GFR 55-60 (sCr 1.4-1.7 range)   Erythrocytosis    Pre and post transplant->on losartan for this   GERD (gastroesophageal reflux disease)    Hx of esoph stricture and dilatation.  +Barrett's esophagus+hx of aspiration pneumonia-->to get rpt EGD when he comes off DAPT (utd as of 01/29/18)   Gout    s/p renal transplant he was weened off of his allopurinol.   Hiatal hernia    History of adenomatous polyp of colon 09/2019   polyp x 1. Recall 5 yrs   History of end stage renal disease 04/24/2011   Secondary to SLE: HD 06/2011-10/2012 (then got renal transplant)   History of renal transplant 11/10/2012   10/2012 [facility]    Hyperlipidemia    Hypertension    implantable cardiac defibrillator single chamber    Medtronic (due to low EF)   Ischemic cardiomyopathy    Chronic systolic dysfunction--  48/1856 EF 30-35%.   Single chamber ICD 11/20/10 ([name])   Left ventricular thrombus 2012   Re-eval 02/2011 showed thrombus RESOLVED, so coumadin was d/c'd (was on it for 82mo   [place]    OSA on CPAP 2018   Dr. [name]9/2018: +OSA on home sleep studay; CPAP auto titrate 5-20 started 05/2017.   Osteoporosis  Prediabetes 12/2019   A1c stable long term at 5.6-5.7 until jump up to 6.2% Aug 2021.    Current Meds  Medication Sig   acetaminophen (TYLENOL) 500 MG tablet Take 1,000 mg by mouth every 6 (six) hours as needed (for pain.).   allopurinol (ZYLOPRIM) 100 MG tablet Take 1 tablet (100 mg total) by mouth daily.   aspirin EC 81 MG tablet Take 81 mg by mouth daily.   atorvastatin (LIPITOR) 80 MG tablet Take 80 mg by mouth at bedtime.   BYSTOLIC 5 MG tablet TAKE 1 TABLET BY MOUTH DAILY   clopidogrel (PLAVIX) 75 MG tablet TAKE 1 TABLET BY MOUTH DAILY   dapagliflozin propanediol (FARXIGA) 10 MG TABS tablet Take 1 tablet (10 mg total) by mouth daily before breakfast.   DEXILANT 60 MG capsule TAKE 1 CAPSULE BY MOUTH  DAILY BEFORE BREAKFAST   famotidine (PEPCID) 20 MG tablet Take 40 mg by mouth at bedtime.    fluticasone (FLONASE) 50 MCG/ACT nasal spray Place 1 spray into both nostrils daily.   magnesium oxide (MAG-OX) 400 MG tablet Take 400 mg by mouth daily.    mexiletine (MEXITIL) 150 MG capsule Take 2 capsules (300 mg total) by mouth 2 (two) times daily.   mycophenolate (CELLCEPT) 250 MG capsule Take 3 capsules (750 mg total) by mouth 2 (two) times daily.   niacin 500 MG tablet Take 1,000 mg by mouth at bedtime.   nitroGLYCERIN (NITROSTAT) 0.4 MG SL tablet Place 1 tablet (0.4 mg total) under the tongue every 5 (five) minutes as needed for chest pain (DO NOT EXCEED 3 DOSES).   predniSONE (DELTASONE) 5 MG tablet TAKE 1 TABLET BY MOUTH DAILY   sacubitril-valsartan (ENTRESTO) 49-51 MG Take 1 tablet by mouth 2 (two) times daily.   tacrolimus (PROGRAF) 0.5 MG capsule Take 0.5 mg by mouth every 12 (twelve) hours.    traMADol (ULTRAM) 50 MG tablet Take 50 mg by mouth every 6 (six) hours as needed. for pain           Allergies  Allergen Reactions   Triptans Palpitations      Reaction to Maxalt      Social History         Socioeconomic History   Marital status: Married      Spouse name: Not on file   Number of children: 2   Years of education: Not on file   Highest education level: Not on file  Occupational History   Occupation: CAD Drafter       Employer: [employer]  Tobacco Use   Smoking status: Former Smoker      Packs/day: 0.50      Years: 30.00      Pack years: 15.00      Types: Cigarettes      Quit date: 08/02/2010      Years since quitting: 9.7   Smokeless tobacco: Never Used  Vaping Use   Vaping Use: Never used  Substance and Sexual Activity   Alcohol use: No      Alcohol/week: 0.0 standard drinks   Drug use: No   Sexual activity: Yes      Partners: Female  Other Topics Concern   Not on file  Social History Narrative    Married, 1 teenage son and 1 teenage daughter.      Occupation: Printmaker.    15 pack-yr smoking hx, quit 07/2010.    Drug Use - no    No alcohol.  Social Determinants of Health    Financial Resource Strain: Not on file  Food Insecurity: Not on file  Transportation Needs: Not on file  Physical Activity: Not on file  Stress: Not on file  Social Connections: Not on file  Intimate Partner Violence: Not on file           Family History  Problem Relation Age of Onset   Kidney failure Mother     Lupus Mother     Stroke Mother     Diabetes Mother          type 2   Heart attack Father          X 7   Hypertension Father     Hyperlipidemia Father     Heart attack Sister 36        X 1   Hyperlipidemia Sister     Hypertension Sister     Heart disease Sister     Heart attack Paternal Grandfather     Heart disease Paternal Aunt     Heart disease Paternal Uncle     Colon cancer Neg Hx     Esophageal cancer Neg Hx     Stomach cancer Neg Hx     Rectal cancer Neg Hx      BP 110/68   Pulse 62   Wt 98.6 kg (217 lb 6.4 oz)   SpO2 96%   BMI 34.05 kg/m    Wt Readings from Last 3 Encounters:  12/30/20 98.6 kg (217 lb 6.4 oz)  09/20/20 101.2 kg (223 lb)  09/07/20 100.2 kg (221 lb)   PHYSICAL EXAM: General:  Well appearing. No resp difficulty HEENT: normal Neck: supple. no JVD. Carotids 2+ bilat; no bruits. No lymphadenopathy or thryomegaly appreciated. Cor: PMI nondisplaced. Regular rate & rhythm. No rubs, gallops or murmurs. Lungs: clear Abdomen: soft, nontender, nondistended. No hepatosplenomegaly. No bruits or masses. Good bowel sounds. Extremities: no cyanosis, clubbing, rash, edema Neuro: alert & orientedx3, cranial nerves grossly intact. moves all 4 extremities w/o difficulty. Affect pleasant    ICD interrogation: Optivol low. No VT. Activity 3hr/day Personally reviewed    ASSESSMENT & PLAN:   1. Chronic systolic heart failure due to iCM - Echo 12/17 EF 30% s/p ICD. - Echo 4/21 EF of 25  to 30% with severe LAE with mild MR and moderate TR. - Cath 04/22/20 for worsening HF symptoms. Stable CAD. - Echo 6/22 EF 30-35%, Grade III DD, severe LAE, mild/mod TR - Much improved NYHA II - Volume status looks good. Encouraged to keep up with fluid intake given orthostasis at times - Continue Farxiga 10 daily.  - Continue Entresto 49/51 mg bid. - Continue Bystolic 5 mg daily.  - Has not been on spiro due to hyperkalemia. KCl stopped. Will try to restart spiro 12.5. He has labs with his nephrologist this week. If K <= 4.5 will start.  - ICD interrogated personally in clinic. Volume low. No VT/AF. Activity level ~1hr/day. Personally reviewed   2.  CAD with prior MI x2 and ischemic cardiomyopathy - Multiple stents to LAD, RCA and LCX - Cath 04/22/20 for worsening HF symptoms.  - No s/s angina - Continue DAPT, statin.   3.  CKD 3a s/p renal transplant in 2012 (due to SLE): - Follows with nephrology, Dr. Clover Mealy and Dr. Lissa Merlin at [facility]. - Creatinine 1.4-1.6  - Continue prednisone, Prograf and CellCept. - Labs with Nephrology this week   4. Frequent PVCs - Zio 12/21 13.4% PVC  burden on Zio with runs of SVT & NSVT. - PVCs seem reduced on mexilitene 300 mg bid.  - Zio (5/22): PVC 11.6 % burden - Will repeat monitor to make sure PVC burden has imrpvoed  [name], MD  8:23 AM

## 2020-12-30 NOTE — Patient Instructions (Signed)
ONLY START Spironolactone 12.5 mg (one half tab) daily,if your potassium level is less than or equal to 4.5  Your provider has recommended that  you wear a Zio Patch for 7  days.  This monitor will record your heart rhythm for our review.  IF you have any symptoms while wearing the monitor please press the button.  If you have any issues with the patch or you notice a red or orange light on it please call the company at [phone].  Once you remove the patch please mail it back to the company as soon as possible so we can get the results.   Your physician recommends that you schedule a follow-up appointment in: 4 months with Dr Haroldine Laws  Do the following things EVERYDAY: Weigh yourself in the morning before breakfast. Write it down and keep it in a log. Take your medicines as prescribed Eat low salt foods--Limit salt (sodium) to 2000 mg per day.  Stay as active as you can everyday Limit all fluids for the day to less than 2 liters  If you have any questions or concerns before your next appointment please send [place] a message through [place] or call our office at [phone].    TO LEAVE A MESSAGE FOR THE NURSE SELECT OPTION 2, PLEASE LEAVE A MESSAGE INCLUDING: YOUR NAME DATE OF BIRTH CALL BACK NUMBER REASON FOR CALL**this is important as we prioritize the call backs  YOU WILL RECEIVE A CALL BACK THE SAME DAY AS LONG AS YOU CALL BEFORE 4:00 PM

## 2021-01-04 ENCOUNTER — Ambulatory Visit (INDEPENDENT_AMBULATORY_CARE_PROVIDER_SITE_OTHER): Payer: Managed Care, Other (non HMO) | Admitting: Family Medicine

## 2021-01-04 ENCOUNTER — Encounter: Payer: Self-pay | Admitting: Family Medicine

## 2021-01-04 ENCOUNTER — Other Ambulatory Visit: Payer: Self-pay

## 2021-01-04 VITALS — BP 119/66 | HR 49 | Temp 97.5°F | Wt 219.0 lb

## 2021-01-04 DIAGNOSIS — E7800 Pure hypercholesterolemia, unspecified: Secondary | ICD-10-CM | POA: Diagnosis not present

## 2021-01-04 DIAGNOSIS — Z23 Encounter for immunization: Secondary | ICD-10-CM | POA: Diagnosis not present

## 2021-01-04 DIAGNOSIS — N183 Chronic kidney disease, stage 3 unspecified: Secondary | ICD-10-CM

## 2021-01-04 DIAGNOSIS — Z0000 Encounter for general adult medical examination without abnormal findings: Secondary | ICD-10-CM | POA: Diagnosis not present

## 2021-01-04 DIAGNOSIS — Z125 Encounter for screening for malignant neoplasm of prostate: Secondary | ICD-10-CM

## 2021-01-04 DIAGNOSIS — R7303 Prediabetes: Secondary | ICD-10-CM

## 2021-01-04 DIAGNOSIS — I10 Essential (primary) hypertension: Secondary | ICD-10-CM | POA: Diagnosis not present

## 2021-01-04 DIAGNOSIS — D751 Secondary polycythemia: Secondary | ICD-10-CM

## 2021-01-04 LAB — TSH: TSH: 2.13 u[IU]/mL (ref 0.35–5.50)

## 2021-01-04 LAB — PSA: PSA: 0.88 ng/mL (ref 0.10–4.00)

## 2021-01-04 NOTE — Progress Notes (Signed)
Office Note 01/04/2021  CC:  Chief Complaint  Patient presents with   Follow-up    RCI. Pt not fasting. Had blood work done on Monday and has not gotten results yet.   HPI:  Roy Dyer is a 55 y.o. White male who is here for annual health maintenance exam and 6 mo f/u borderline DM2, HTN, HLD, CRI III and hx of renal transplant back in 2014. He has chronic combined systolic and diastolic HF, on max med mgmt + has AICD, followed by CHF clinic and electrophysiology.  I started him on farxiga 55m in Feb this year b/c a1c had risen to 6.5% and to potentially help his CHF.  I d/c'd his lasix at that time. Echo 10/13/20 showed mildly improved EF (30-35%).  Labs [name] from 01/02/21 reviewed today:  cbc normal, sCr stable, K 5.0.  Past Medical History:  Diagnosis Date   AICD (automatic cardioverter/defibrillator) present    [name] remotely-yrly checks, Dr. Nishan,cardiology   Avascular necrosis of bone of hip ([facility] 2010 surg   Left hip arthroplasty: from chronic systemic steroids taken for his Lupus   Barrett's esophagus 2017; 2021   Blood transfusion without reported diagnosis 2010, 2012   CAD (coronary artery disease)    **DAPT long term recommended by cards**.  a. stents RCA/Circ 2001 b. BMS to LAD 07/2010  c. 01/2017: s/p DES to RCA.    Cataract    left eye   CHF (congestive heart failure) (HCC)    Chronic headaches 02/2017   As of 01/2017, HA's no better, neurologist ordered CT.  ESR normal at that time.  HA'S COMPLETELY RESOLVED AFTER HE GOT ON CPAP 2019/2020.   Chronic renal insufficiency, stage 3 (moderate) (HCC)    GFR 55-60 (sCr 1.4-1.7 range)   Erythrocytosis    Pre and post transplant->on losartan for this   GERD (gastroesophageal reflux disease)    Hx of esoph stricture and dilatation.  +Barrett's esophagus+hx of aspiration pneumonia-->to get rpt EGD when he comes off DAPT (utd as of 01/29/18)   Gout    s/p renal transplant he was weened  off of his allopurinol.   Hiatal hernia    History of adenomatous polyp of colon 09/2019   polyp x 1. Recall 5 yrs   History of end stage renal disease 04/24/2011   Secondary to SLE: HD 06/2011-10/2012 (then got renal transplant)   [facility] renal [facility]10/2012   [facility]   Hyperlipidemia    Hypertension    implantable cardiac defibrillator single chamber    Medtronic (due to low EF)   Ischemic cardiomyopathy    Chronic systolic dysfunction--  192/4462EF 30-35%.   Single chamber ICD 11/20/10 (Dr. KCaryl Comes   Left ventricular thrombus 2012   Re-eval 02/2011 showed thrombus RESOLVED, so coumadin was d/c'd (was on it for 630mo  [place]   OSA on CPAP 2018   Dr. YoAnnamaria Boots/2018: +OSA on home sleep studay; CPAP auto titrate 5-20 started 05/2017.   Osteoporosis    Prediabetes 12/2019   A1c stable long term at 5.6-5.7 until jump up to 6.2% Aug 2021.    Past Surgical History:  Procedure Laterality Date   AV FISTULA PLACEMENT  03/28/2011   Procedure: ARTERIOVENOUS (AV) FISTULA CREATION;  Surgeon: ToRosetta PosnerMD;  [facility] Service: [facility];  Laterality: Left;  Creation of Left radiocephallic cimino fistula   AV FISTULA PLACEMENT  04/27/2011   Procedure:  ARTERIOVENOUS (AV) FISTULA CREATION;  Surgeon: Rosetta Posner, MD;  Location: [facility];  Service: Vascular;  Laterality: Left;  left basilic vein transposition   BIOPSY  09/14/2019   Procedure: BIOPSY;  Surgeon: Jerene Bears, MD;  [facility];  Service: Gastroenterology;;   CARDIAC CATHETERIZATION  08/2010; 04/22/20   08/2010; 04/2020->mild/mod in-stent restenosis but none hemodynamically signif, severe LV dysfxn, frequenct PVCs-->referred to HF clinic for optimization of med mgmt   CARDIOVASCULAR STRESS TEST  07/2010; 03/2014   03/2014 showed large old infarct and EF 30-35% but no ischemia   COLONOSCOPY  09/14/2019   Adenoma x 1.  Recall 5 yrs   COLONOSCOPY WITH PROPOFOL N/A 09/14/2019   Procedure: COLONOSCOPY WITH  PROPOFOL;  Surgeon: Jerene Bears, MD;  [facility];  Service: Gastroenterology;  Laterality: N/A;   CORONARY PRESSURE WIRE/FFR WITH 3D MAPPING N/A 04/22/2020   Procedure: Coronary Pressure Wire/FFR w/3D Mapping;  Surgeon: Wellington Hampshire, MD;  Location: [place] [facility];  Service: Cardiovascular;  Laterality: N/A;   DEXA  07/05/2017   Normal bone density in radius and spine.   ESOPHAGOGASTRODUODENOSCOPY  02/2009   Done by Dr. Fuller Plan for hematemesis; esophagitis found.  Repeat recommended 09/2016, at which time he will also likely get his initial screening colonoscopy.   ESOPHAGOGASTRODUODENOSCOPY  09/14/2019   esophagitis (improved compared to 2017 EGD), Barrett's.  Stomach and duodenum normal.   ESOPHAGOGASTRODUODENOSCOPY (EGD) WITH PROPOFOL N/A 09/29/2015   REPEAT EGD RECOMMENDED 09/2016.  Barrett's esoph + mild chronic gastritis.  H pylori NEG. Procedure: ESOPHAGOGASTRODUODENOSCOPY (EGD) WITH PROPOFOL;  Surgeon: Jerene Bears, MD;  [facility];  Service: Gastroenterology;  Laterality: N/A;   ESOPHAGOGASTRODUODENOSCOPY (EGD) WITH PROPOFOL N/A 09/14/2019   Procedure: ESOPHAGOGASTRODUODENOSCOPY (EGD) WITH PROPOFOL;  Surgeon: Jerene Bears, MD;  [facility];  Service: Gastroenterology;  Laterality: N/A;   ICD GENERATOR CHANGEOUT N/A 04/11/2018   Procedure: ICD GENERATOR CHANGEOUT;  Surgeon: Deboraha Sprang, MD;  Location: [place] [facility];  Service: Cardiovascular;  Laterality: N/A;   INSERT / REPLACE / REMOVE PACEMAKER     medtronic        dr [name]    [place]   icd only   KIDNEY TRANSPLANT  10/2012   [facility] nephrologist--Dr. Blair Heys   LEFT HEART CATH AND CORONARY ANGIOGRAPHY N/A 02/08/2017   PCA and DES to mRCA--needs DAPT for at least 6 mo.   Procedure: LEFT HEART CATH AND CORONARY ANGIOGRAPHY;  Surgeon: Sherren Mocha, MD;  Location: [place] [facility];  Service: Cardiovascular;  Laterality: N/A;   LEFT HEART CATH AND CORONARY ANGIOGRAPHY N/A  04/22/2020   Procedure: LEFT HEART CATH AND CORONARY ANGIOGRAPHY;  Surgeon: Wellington Hampshire, MD;  Location: [place] [facility];  Service: Cardiovascular;  Laterality: N/A;   PARTIAL HIP ARTHROPLASTY Left 2010   left   POLYPECTOMY  09/14/2019   Procedure: POLYPECTOMY;  Surgeon: Jerene Bears, MD;  [facility];  Service: Gastroenterology;;   RENAL BIOPSY     stents     05-2010 and 2- in 2010 and 1 in 2018.   TOTAL HIP ARTHROPLASTY  07/09/2011   Procedure: TOTAL HIP ARTHROPLASTY;  Surgeon: Kerin Salen, MD;  Location: [facility];  Service: Orthopedics;  Laterality: Right;   TRANSTHORACIC ECHOCARDIOGRAM  10/13/2020   EF 30-35% (slight improved), grd III DD, elev PA pressure, mold/mod tricusp regurg   TYMPANOSTOMY TUBE PLACEMENT  55 yrs old   US ECHOCARDIOGRAPHY  12/2010; 02/2014;08/2014;08/2019  02/2014 EF still 25-30%, increased PA pressures.  2016 EF 30-35%, sept/inf hypokinesis, mod tricusp regurg. 08/2019 EF 25-30%, global hypok, sev dil LV w/apical aneur, grd III DD, mod tricus reg, mod pulm htn   ZIO PATCH  04/2020   HIGH PVC BURDEN.  NSVT, SVT, BBB.    Family History  Problem Relation Age of Onset   Kidney failure Mother    Lupus Mother    Stroke Mother    Diabetes Mother        type 2   Heart attack Father        X 7   Hypertension Father    Hyperlipidemia Father    Heart attack Sister 13       X 1   Hyperlipidemia Sister    Hypertension Sister    Heart disease Sister    Heart attack Paternal Grandfather    Heart disease Paternal Aunt    Heart disease Paternal Uncle    Colon cancer Neg Hx    Esophageal cancer Neg Hx    Stomach cancer Neg Hx    Rectal cancer Neg Hx     Social History   Socioeconomic History   Marital status: Married    Spouse name: Not on file   Number of children: 2   Years of education: Not on file   Highest education level: Not on file  Occupational History   Occupation: CAD Drafter     Employer: [employer]  Tobacco Use   Smoking status:  Former    Packs/day: 0.50    Years: 30.00    Pack years: 15.00    Types: Cigarettes    Quit date: 08/02/2010    Years since quitting: 10.4   Smokeless tobacco: Never  Vaping Use   Vaping Use: Never used  Substance and Sexual Activity   Alcohol use: No    Alcohol/week: 0.0 standard drinks   Drug use: No   Sexual activity: Yes    Partners: Female  Other Topics Concern   Not on file  Social History Narrative   Married, one adult daughter and one adult son.    Occupation: Printmaker.1   5 pack-yr smoking hx, quit 07/2010.   Drug Use - noNo alcohol.   Social Determinants of Health   Financial Resource Strain: Not on file  Food Insecurity: Not on file  Transportation Needs: Not on file  Physical Activity: Not on file  Stress: Not on file  Social Connections: Not on file  Intimate Partner Violence: Not on file    Outpatient Medications Prior to Visit  Medication Sig Dispense Refill   acetaminophen (TYLENOL) 500 MG tablet Take 1,000 mg by mouth every 6 (six) hours as needed (for pain.).     allopurinol (ZYLOPRIM) 100 MG tablet Take 1 tablet (100 mg total) by mouth daily. 90 tablet 3   aspirin EC 81 MG tablet Take 81 mg by mouth daily.     atorvastatin (LIPITOR) 80 MG tablet Take 80 mg by mouth at bedtime.     BYSTOLIC 5 MG tablet TAKE 1 TABLET BY MOUTH DAILY 90 tablet 3   clopidogrel (PLAVIX) 75 MG tablet TAKE 1 TABLET BY MOUTH DAILY 90 tablet 3   dapagliflozin propanediol (FARXIGA) 10 MG TABS tablet Take 1 tablet (10 mg total) by mouth daily before breakfast. 90 tablet 0   DEXILANT 60 MG capsule TAKE 1 CAPSULE BY MOUTH DAILY BEFORE BREAKFAST 90 capsule 0   famotidine (PEPCID) 20 MG tablet Take 40 mg  by mouth at bedtime.      fluticasone (FLONASE) 50 MCG/ACT nasal spray Place 1 spray into both nostrils daily.     magnesium oxide (MAG-OX) 400 MG tablet Take 400 mg by mouth daily.      mexiletine (MEXITIL) 150 MG capsule Take 2 capsules (300 mg total) by mouth 2 (two)  times daily. 360 capsule 3   mycophenolate (CELLCEPT) 250 MG capsule Take 3 capsules (750 mg total) by mouth 2 (two) times daily. 540 capsule 1   niacin 500 MG tablet Take 1,000 mg by mouth at bedtime.     nitroGLYCERIN (NITROSTAT) 0.4 MG SL tablet Place 1 tablet (0.4 mg total) under the tongue every 5 (five) minutes as needed for chest pain (DO NOT EXCEED 3 DOSES). 25 tablet 3   predniSONE (DELTASONE) 5 MG tablet TAKE 1 TABLET BY MOUTH DAILY 90 tablet 3   sacubitril-valsartan (ENTRESTO) 49-51 MG Take 1 tablet by mouth 2 (two) times daily. 180 tablet 3   spironolactone (ALDACTONE) 25 MG tablet Take 0.5 tablets (12.5 mg total) by mouth daily. 45 tablet 3   tacrolimus (PROGRAF) 0.5 MG capsule Take 0.5 mg by mouth every 12 (twelve) hours.      traMADol (ULTRAM) 50 MG tablet Take 50 mg by mouth every 6 (six) hours as needed. for pain  0   No facility-administered medications prior to visit.    Allergies  Allergen Reactions   Triptans Palpitations    Reaction to Maxalt    ROS Review of Systems  Constitutional:  Negative for appetite change, chills, fatigue and fever.  HENT:  Negative for congestion, dental problem, ear pain and sore throat.   Eyes:  Negative for discharge, redness and visual disturbance.  Respiratory:  Positive for shortness of breath (chronic DOE--stable). Negative for cough, chest tightness and wheezing.   Cardiovascular:  Negative for chest pain, palpitations and leg swelling.  Gastrointestinal:  Negative for abdominal pain, blood in stool, diarrhea, nausea and vomiting.  Genitourinary:  Negative for difficulty urinating, dysuria, flank pain, frequency, hematuria and urgency.  Musculoskeletal:  Negative for arthralgias, back pain, joint swelling, myalgias and neck stiffness.  Skin:  Negative for pallor and rash.  Neurological:  Negative for dizziness, speech difficulty, weakness and headaches.  Hematological:  Negative for adenopathy. Does not bruise/bleed easily.   Psychiatric/Behavioral:  Negative for confusion and sleep disturbance. The patient is not nervous/anxious.    PE; Vitals with BMI 01/04/2021 12/30/2020 11/04/2020  Height - - -  Weight 219 lbs 217 lbs 6 oz -  BMI - - -  Systolic 798 921 194  Diastolic 66 68 68  Pulse 49 62 -     Gen: Alert, well appearing.  Patient is oriented to person, place, time, and situation. AFFECT: pleasant, lucid thought and speech. ENT: Ears: EACs clear, normal epithelium.  TMs with good light reflex and landmarks bilaterally.  Eyes: no injection, icteris, swelling, or exudate.  EOMI, PERRLA. Nose: no drainage or turbinate edema/swelling.  No injection or focal lesion.  Mouth: lips without lesion/swelling.  Oral mucosa pink and moist.  Dentition intact and without obvious caries or gingival swelling.  Oropharynx without erythema, exudate, or swelling.  Neck: supple/nontender.  No LAD, mass, or TM.  Carotid pulses 2+ bilaterally, without bruits. CV: RRR with occ brief pause, no m/r/g.   LUNGS: CTA bilat, nonlabored resps, good aeration in all lung fields. ABD: soft, NT, ND, BS normal.  No hepatospenomegaly or mass.  No bruits. EXT: no clubbing or  cyanosis.  1+ bilat LL pitting edema. Musculoskeletal: no joint swelling, erythema, warmth, or tenderness.  ROM of all joints intact. Skin - no sores or suspicious lesions or rashes or color changes  Pertinent labs:  Lab Results  Component Value Date   TSH 2.04 01/06/2020   Lab Results  Component Value Date   WBC 10.2 09/07/2020   HGB 17.1 (H) 09/07/2020   HCT 53.4 (H) 09/07/2020   MCV 95.5 09/07/2020   PLT 197 09/07/2020   Lab Results  Component Value Date   IRON 10 (L) 07/11/2011   TIBC 154 (L) 07/11/2011   FERRITIN 132 07/11/2011   Lab Results  Component Value Date   CREATININE 1.46 (H) 09/07/2020   BUN 14 09/07/2020   NA 140 09/07/2020   K 4.9 09/07/2020   CL 105 09/07/2020   CO2 29 09/07/2020   Lab Results  Component Value Date   ALT 16  05/19/2020   AST 20 05/19/2020   ALKPHOS 90 05/19/2020   BILITOT 1.6 (H) 05/19/2020   Lab Results  Component Value Date   CHOL 115 07/07/2020   Lab Results  Component Value Date   HDL 63.50 07/07/2020   Lab Results  Component Value Date   LDLCALC 34 07/07/2020   Lab Results  Component Value Date   TRIG 83.0 07/07/2020   Lab Results  Component Value Date   CHOLHDL 2 07/07/2020   Lab Results  Component Value Date   PSA 1.22 01/06/2020   PSA 1.70 07/15/2018   PSA 1.71 10/23/2010   Lab Results  Component Value Date   HGBA1C 6.5 07/07/2020   ASSESSMENT AND PLAN:   1) HTN: well controlled, cont bystolic.  2) Prediabetes.  Last a1c 2 mo ago with nephrol was 5.7%--signif improvement on farxiga.  Cont farxiga 20m qd.  3) CRI: stable stage IIIa on most recent labs 01/02/21 with transplant clinic.  4) Chronic combined systolic and diastolic HF:  doing well on entresto and bystolic.  No diuretic.  He has to hold off on starting spironolactone b/c most recent K+ was 5.0.  Last EF up a little to 30-35% about 2 mo ago.  5) Health maintenance exam: Reviewed age and gender appropriate health maintenance issues (prudent diet, regular exercise, health risks of tobacco and excessive alcohol, use of seatbelts, fire alarms in home, use of sunscreen).  Also reviewed age and gender appropriate health screening as well as vaccine recommendations. Vaccines: Tdap due->given today.  Prenvar 20 given today.  Shingrix->he'll check with his nephrologist first.  Otherwise ALL UTD. Labs: he only needs screening TSH and PSA today. Prostate ca screening: PSA. Colon ca screening: Recent colonoscopy 09/2019 with adenomatous polyps-> rpt 2026. He gets annual skin ca screening at derm. Dental preventatives UTD.  An After Visit Summary was printed and given to the patient.  FOLLOW UP:  Return in about 6 months (around 07/07/2021).  Signed:  PCrissie Sickles MD           01/04/2021

## 2021-01-12 DIAGNOSIS — I4892 Unspecified atrial flutter: Secondary | ICD-10-CM

## 2021-01-12 DIAGNOSIS — M3212 Pericarditis in systemic lupus erythematosus: Secondary | ICD-10-CM

## 2021-01-12 DIAGNOSIS — I319 Disease of pericardium, unspecified: Secondary | ICD-10-CM

## 2021-01-12 HISTORY — DX: Unspecified atrial flutter: I48.92

## 2021-01-12 HISTORY — DX: Pericarditis in systemic lupus erythematosus: M32.12

## 2021-01-12 HISTORY — DX: Disease of pericardium, unspecified: I31.9

## 2021-01-30 ENCOUNTER — Encounter: Payer: Self-pay | Admitting: Internal Medicine

## 2021-01-30 NOTE — Progress Notes (Signed)
HPI male former smoker followed for OSA, complicated by Lupus, HBP, CAD, MI/CM/CHF/AICD, renal transplant, GERD/Barrett's HST 04/24/17-AHI 15/hour, desaturation to 84%, body weight 221 pounds  ------------------------------------------------------------------------------------------------   02/01/20- 55 year old male former smoker followed for OSA, complicated by Lupus, HBP, CAD, MI/CM/CHF/AICD, renal transplant, GERD/Barrett's CPAP auto 5-12/ [facility]PS/ [employer] Download compliance 90%, AHI4.8/ hr Body weight today- 230 ;bs Covid Vax- 3 Phizer Flu vax- today ------mask leaking, drying eyes out.  Cannot get mask tight enough. He has looked into options and wants to try nasal mask. Has chin strap but doesn't like using. Discussed So-Clean. His CPAP is noisey and he wonders if So-Clean bad for motor.  01/31/21- 55 year old male former smoker ( 15 pk yrs)followed for OSA, complicated by Lupus, HBP, CAD, MI/CM/CHF/AICD, renal transplant, GERD/Barrett's CPAP auto 5-12/ [facility]PS/ [employer]     AirSense 11 AutoSet (Dec, 2021) Download-compliance 83%, AHI 2.9/ hr Body weight today-217 lbs Covid vax-3 Phizer ------OSA, denies problems Download reviewed. Ok, but beard allows some mask leak. Discussed options. Renal function stable.   ROS-see HPI   + = Positive Constitutional:    weight loss, night sweats, fevers, chills, fatigue, lassitude. HEENT:    + headaches, difficulty swallowing, tooth/dental problems, sore throat,       sneezing, itching, ear ache, nasal congestion, post nasal drip, snoring CV:    chest pain, orthopnea, PND, swelling in lower extremities, anasarca,                                                    dizziness, palpitations Resp:   shortness of breath with exertion or at rest.                productive cough,   non-productive cough, coughing up of blood.              change in color of mucus.  wheezing.   Skin:    rash or lesions. GI:  No-   heartburn, indigestion, abdominal pain,  nausea, vomiting, diarrhea,                 change in bowel habits, loss of appetite GU: dysuria, change in color of urine, no urgency or frequency.   flank pain. MS:   joint pain, stiffness, decreased range of motion, back pain. Neuro-     nothing unusual Psych:  change in mood or affect.  depression or anxiety.   memory loss.  OBJ- Physical Exam General- Alert, Oriented, Affect-appropriate, Distress- none acute, + obese Skin- rash-none, lesions- none, excoriation- none Lymphadenopathy- none Head- atraumatic            Eyes- Gross vision intact, PERRLA, conjunctivae and secretions clear            Ears- Hearing, canals-normal            Nose- Clear, no-Septal dev, mucus, polyps, erosion, perforation             Throat- Mallampati II , mucosa clear , drainage- none, tonsils- atrophic Neck- flexible , trachea midline, no stridor , thyroid nl, carotid no bruit Chest - symmetrical excursion , unlabored           Heart/CV- RRR , no murmur , no gallop  , no rub, nl s1 s2                           -  JVD- none , edema- none, stasis changes- none, varices- none           Lung- clear to P&A, wheeze- none, cough- none , dullness-none, rub- none           Chest wall- + AICD R Abd-  Br/ Gen/ Rectal- Not done, not indicated Extrem- cyanosis- none, clubbing, none, atrophy- none, strength- nl Neuro- grossly intact to observation

## 2021-01-31 ENCOUNTER — Other Ambulatory Visit: Payer: Self-pay

## 2021-01-31 ENCOUNTER — Encounter: Payer: Self-pay | Admitting: Internal Medicine

## 2021-01-31 ENCOUNTER — Ambulatory Visit: Payer: Managed Care, Other (non HMO) | Admitting: Internal Medicine

## 2021-01-31 DIAGNOSIS — Z23 Encounter for immunization: Secondary | ICD-10-CM

## 2021-01-31 DIAGNOSIS — I255 Ischemic cardiomyopathy: Secondary | ICD-10-CM

## 2021-01-31 DIAGNOSIS — G4733 Obstructive sleep apnea (adult) (pediatric): Secondary | ICD-10-CM

## 2021-01-31 NOTE — Patient Instructions (Signed)
Order- flu vax standard  We can continue CPAP auto 5-12  Please call if we can help

## 2021-02-03 ENCOUNTER — Other Ambulatory Visit: Payer: Self-pay

## 2021-02-03 ENCOUNTER — Inpatient Hospital Stay (HOSPITAL_COMMUNITY)
Admission: EM | Admit: 2021-02-03 | Discharge: 2021-02-08 | DRG: 545 | Disposition: A | Discharge status: Home / Self Care | Payer: Managed Care, Other (non HMO) | Attending: Family Medicine | Admitting: Family Medicine

## 2021-02-03 ENCOUNTER — Emergency Department (HOSPITAL_COMMUNITY): Payer: Managed Care, Other (non HMO)

## 2021-02-03 ENCOUNTER — Encounter (HOSPITAL_COMMUNITY): Payer: Self-pay

## 2021-02-03 DIAGNOSIS — E782 Mixed hyperlipidemia: Secondary | ICD-10-CM | POA: Diagnosis present

## 2021-02-03 DIAGNOSIS — I252 Old myocardial infarction: Secondary | ICD-10-CM | POA: Diagnosis not present

## 2021-02-03 DIAGNOSIS — Z9289 Personal history of other medical treatment: Secondary | ICD-10-CM | POA: Diagnosis not present

## 2021-02-03 DIAGNOSIS — N170 Acute kidney failure with tubular necrosis: Secondary | ICD-10-CM

## 2021-02-03 DIAGNOSIS — Z95810 Presence of automatic (implantable) cardiac defibrillator: Secondary | ICD-10-CM

## 2021-02-03 DIAGNOSIS — T8619 Other complication of kidney transplant: Secondary | ICD-10-CM | POA: Diagnosis present

## 2021-02-03 DIAGNOSIS — I7300 Raynaud's syndrome without gangrene: Secondary | ICD-10-CM | POA: Diagnosis present

## 2021-02-03 DIAGNOSIS — Z833 Family history of diabetes mellitus: Secondary | ICD-10-CM

## 2021-02-03 DIAGNOSIS — I5023 Acute on chronic systolic (congestive) heart failure: Secondary | ICD-10-CM

## 2021-02-03 DIAGNOSIS — Y830 Surgical operation with transplant of whole organ as the cause of abnormal reaction of the patient, or of later complication, without mention of misadventure at the time of the procedure: Secondary | ICD-10-CM | POA: Diagnosis present

## 2021-02-03 DIAGNOSIS — Z7902 Long term (current) use of antithrombotics/antiplatelets: Secondary | ICD-10-CM

## 2021-02-03 DIAGNOSIS — K219 Gastro-esophageal reflux disease without esophagitis: Secondary | ICD-10-CM | POA: Diagnosis present

## 2021-02-03 DIAGNOSIS — Z955 Presence of coronary angioplasty implant and graft: Secondary | ICD-10-CM

## 2021-02-03 DIAGNOSIS — R7303 Prediabetes: Secondary | ICD-10-CM | POA: Diagnosis present

## 2021-02-03 DIAGNOSIS — R079 Chest pain, unspecified: Secondary | ICD-10-CM | POA: Diagnosis not present

## 2021-02-03 DIAGNOSIS — D72829 Elevated white blood cell count, unspecified: Secondary | ICD-10-CM | POA: Diagnosis present

## 2021-02-03 DIAGNOSIS — Z841 Family history of disorders of kidney and ureter: Secondary | ICD-10-CM

## 2021-02-03 DIAGNOSIS — N179 Acute kidney failure, unspecified: Secondary | ICD-10-CM | POA: Diagnosis present

## 2021-02-03 DIAGNOSIS — I4892 Unspecified atrial flutter: Secondary | ICD-10-CM | POA: Diagnosis present

## 2021-02-03 DIAGNOSIS — I5043 Acute on chronic combined systolic (congestive) and diastolic (congestive) heart failure: Secondary | ICD-10-CM | POA: Diagnosis present

## 2021-02-03 DIAGNOSIS — I4891 Unspecified atrial fibrillation: Secondary | ICD-10-CM | POA: Diagnosis present

## 2021-02-03 DIAGNOSIS — D849 Immunodeficiency, unspecified: Secondary | ICD-10-CM

## 2021-02-03 DIAGNOSIS — Z940 Kidney transplant status: Secondary | ICD-10-CM | POA: Diagnosis not present

## 2021-02-03 DIAGNOSIS — M3212 Pericarditis in systemic lupus erythematosus: Principal | ICD-10-CM | POA: Diagnosis present

## 2021-02-03 DIAGNOSIS — I255 Ischemic cardiomyopathy: Secondary | ICD-10-CM | POA: Diagnosis present

## 2021-02-03 DIAGNOSIS — Z888 Allergy status to other drugs, medicaments and biological substances status: Secondary | ICD-10-CM

## 2021-02-03 DIAGNOSIS — I5020 Unspecified systolic (congestive) heart failure: Secondary | ICD-10-CM | POA: Diagnosis not present

## 2021-02-03 DIAGNOSIS — Z87891 Personal history of nicotine dependence: Secondary | ICD-10-CM

## 2021-02-03 DIAGNOSIS — G4733 Obstructive sleep apnea (adult) (pediatric): Secondary | ICD-10-CM | POA: Diagnosis present

## 2021-02-03 DIAGNOSIS — K2270 Barrett's esophagus without dysplasia: Secondary | ICD-10-CM | POA: Diagnosis present

## 2021-02-03 DIAGNOSIS — Z8249 Family history of ischemic heart disease and other diseases of the circulatory system: Secondary | ICD-10-CM

## 2021-02-03 DIAGNOSIS — Z20822 Contact with and (suspected) exposure to covid-19: Secondary | ICD-10-CM | POA: Diagnosis present

## 2021-02-03 DIAGNOSIS — I483 Typical atrial flutter: Secondary | ICD-10-CM | POA: Diagnosis not present

## 2021-02-03 DIAGNOSIS — T380X5A Adverse effect of glucocorticoids and synthetic analogues, initial encounter: Secondary | ICD-10-CM | POA: Diagnosis present

## 2021-02-03 DIAGNOSIS — Z8701 Personal history of pneumonia (recurrent): Secondary | ICD-10-CM

## 2021-02-03 DIAGNOSIS — M109 Gout, unspecified: Secondary | ICD-10-CM | POA: Diagnosis present

## 2021-02-03 DIAGNOSIS — R0789 Other chest pain: Secondary | ICD-10-CM

## 2021-02-03 DIAGNOSIS — M810 Age-related osteoporosis without current pathological fracture: Secondary | ICD-10-CM | POA: Diagnosis present

## 2021-02-03 DIAGNOSIS — R071 Chest pain on breathing: Secondary | ICD-10-CM | POA: Diagnosis not present

## 2021-02-03 DIAGNOSIS — I2510 Atherosclerotic heart disease of native coronary artery without angina pectoris: Secondary | ICD-10-CM | POA: Diagnosis present

## 2021-02-03 DIAGNOSIS — Z83438 Family history of other disorder of lipoprotein metabolism and other lipidemia: Secondary | ICD-10-CM

## 2021-02-03 DIAGNOSIS — Z79899 Other long term (current) drug therapy: Secondary | ICD-10-CM

## 2021-02-03 DIAGNOSIS — N1831 Chronic kidney disease, stage 3a: Secondary | ICD-10-CM | POA: Diagnosis present

## 2021-02-03 DIAGNOSIS — I132 Hypertensive heart and chronic kidney disease with heart failure and with stage 5 chronic kidney disease, or end stage renal disease: Secondary | ICD-10-CM | POA: Diagnosis present

## 2021-02-03 DIAGNOSIS — Z832 Family history of diseases of the blood and blood-forming organs and certain disorders involving the immune mechanism: Secondary | ICD-10-CM

## 2021-02-03 DIAGNOSIS — Z7952 Long term (current) use of systemic steroids: Secondary | ICD-10-CM

## 2021-02-03 LAB — COMPREHENSIVE METABOLIC PANEL
ALT: 14 U/L (ref 0–44)
AST: 17 U/L (ref 15–41)
Albumin: 3.5 g/dL (ref 3.5–5.0)
Alkaline Phosphatase: 92 U/L (ref 38–126)
Anion gap: 7 (ref 5–15)
BUN: 20 mg/dL (ref 6–20)
CO2: 26 mmol/L (ref 22–32)
Calcium: 9.1 mg/dL (ref 8.9–10.3)
Chloride: 104 mmol/L (ref 98–111)
Creatinine, Ser: 1.61 mg/dL — ABNORMAL HIGH (ref 0.61–1.24)
GFR, Estimated: 51 mL/min — ABNORMAL LOW (ref >60)
Glucose, Bld: 192 mg/dL — ABNORMAL HIGH (ref 70–99)
Potassium: 4.9 mmol/L (ref 3.5–5.1)
Sodium: 137 mmol/L (ref 135–145)
Total Bilirubin: 0.7 mg/dL (ref 0.3–1.2)
Total Protein: 5.6 g/dL — ABNORMAL LOW (ref 6.5–8.1)

## 2021-02-03 LAB — URINALYSIS, ROUTINE W REFLEX MICROSCOPIC
Bacteria, UA: NONE SEEN
Bilirubin Urine: NEGATIVE
Glucose, UA: >=500 mg/dL — AB
Hgb urine dipstick: NEGATIVE
Ketones, ur: NEGATIVE mg/dL
Leukocytes,Ua: NEGATIVE
Nitrite: NEGATIVE
Protein, ur: NEGATIVE mg/dL
Specific Gravity, Urine: 1.02 (ref 1.005–1.030)
pH: 6 (ref 5.0–8.0)

## 2021-02-03 LAB — I-STAT CHEM 8, ED
BUN: 22 mg/dL — ABNORMAL HIGH (ref 6–20)
Calcium, Ion: 1.06 mmol/L — ABNORMAL LOW (ref 1.15–1.40)
Chloride: 102 mmol/L (ref 98–111)
Creatinine, Ser: 1.6 mg/dL — ABNORMAL HIGH (ref 0.61–1.24)
Glucose, Bld: 185 mg/dL — ABNORMAL HIGH (ref 70–99)
HCT: 52 % (ref 39.0–52.0)
Hemoglobin: 17.7 g/dL — ABNORMAL HIGH (ref 13.0–17.0)
Potassium: 4.8 mmol/L (ref 3.5–5.1)
Sodium: 138 mmol/L (ref 135–145)
TCO2: 28 mmol/L (ref 22–32)

## 2021-02-03 LAB — CBC
HCT: 53 % — ABNORMAL HIGH (ref 39.0–52.0)
Hemoglobin: 16.9 g/dL (ref 13.0–17.0)
MCH: 29.5 pg (ref 26.0–34.0)
MCHC: 31.9 g/dL (ref 30.0–36.0)
MCV: 92.7 fL (ref 80.0–100.0)
Platelets: 170 10*3/uL (ref 150–400)
RBC: 5.72 MIL/uL (ref 4.22–5.81)
RDW: 13.8 % (ref 11.5–15.5)
WBC: 12.5 10*3/uL — ABNORMAL HIGH (ref 4.0–10.5)
nRBC: 0 % (ref 0.0–0.2)

## 2021-02-03 LAB — RESP PANEL BY RT-PCR (FLU A&B, COVID) ARPGX2
Influenza A by PCR: NEGATIVE
Influenza B by PCR: NEGATIVE
SARS Coronavirus 2 by RT PCR: NEGATIVE

## 2021-02-03 LAB — LIPASE, BLOOD: Lipase: 28 U/L (ref 11–51)

## 2021-02-03 LAB — TROPONIN I (HIGH SENSITIVITY)
Troponin I (High Sensitivity): 21 ng/L — ABNORMAL HIGH (ref <18)
Troponin I (High Sensitivity): 20 ng/L — ABNORMAL HIGH (ref <18)

## 2021-02-03 LAB — MAGNESIUM: Magnesium: 1.7 mg/dL (ref 1.7–2.4)

## 2021-02-03 LAB — BRAIN NATRIURETIC PEPTIDE: B Natriuretic Peptide: 1441.7 pg/mL — ABNORMAL HIGH (ref 0.0–100.0)

## 2021-02-03 MED ORDER — MEXILETINE HCL 150 MG PO CAPS
300.0000 mg | ORAL_CAPSULE | Freq: Two times a day (BID) | ORAL | Status: DC
  Administered 2021-02-04 – 2021-02-08 (×9): 300 mg via ORAL
  Filled 2021-02-03 (×10): qty 2

## 2021-02-03 MED ORDER — TACROLIMUS 0.5 MG PO CAPS
0.5000 mg | ORAL_CAPSULE | Freq: Two times a day (BID) | ORAL | Status: DC
  Administered 2021-02-04 – 2021-02-08 (×9): 0.5 mg via ORAL
  Filled 2021-02-03 (×10): qty 1

## 2021-02-03 MED ORDER — SACUBITRIL-VALSARTAN 49-51 MG PO TABS
1.0000 | ORAL_TABLET | Freq: Two times a day (BID) | ORAL | Status: DC
  Administered 2021-02-04 – 2021-02-05 (×3): 1 via ORAL
  Filled 2021-02-03 (×3): qty 1

## 2021-02-03 MED ORDER — DAPAGLIFLOZIN PROPANEDIOL 10 MG PO TABS
10.0000 mg | ORAL_TABLET | Freq: Every day | ORAL | Status: DC
  Administered 2021-02-04 – 2021-02-06 (×3): 10 mg via ORAL
  Filled 2021-02-03 (×4): qty 1

## 2021-02-03 MED ORDER — COLCHICINE 0.6 MG PO TABS
0.6000 mg | ORAL_TABLET | Freq: Two times a day (BID) | ORAL | Status: DC
  Filled 2021-02-03: qty 1

## 2021-02-03 MED ORDER — SODIUM CHLORIDE 0.45 % IV SOLN: INTRAVENOUS | Status: DC

## 2021-02-03 MED ORDER — SPIRONOLACTONE 12.5 MG HALF TABLET
12.5000 mg | ORAL_TABLET | Freq: Every day | ORAL | Status: DC
  Filled 2021-02-03 (×3): qty 1

## 2021-02-03 MED ORDER — APIXABAN 5 MG PO TABS
5.0000 mg | ORAL_TABLET | Freq: Two times a day (BID) | ORAL | Status: DC
  Administered 2021-02-03 – 2021-02-08 (×10): 5 mg via ORAL
  Filled 2021-02-03 (×10): qty 1

## 2021-02-03 MED ORDER — CLOPIDOGREL BISULFATE 75 MG PO TABS
75.0000 mg | ORAL_TABLET | Freq: Every day | ORAL | Status: DC
  Administered 2021-02-04 – 2021-02-08 (×5): 75 mg via ORAL
  Filled 2021-02-03 (×5): qty 1

## 2021-02-03 MED ORDER — AMIODARONE HCL IN DEXTROSE 360-4.14 MG/200ML-% IV SOLN
60.0000 mg/h | INTRAVENOUS | Status: AC
  Administered 2021-02-03: 60 mg/h via INTRAVENOUS
  Filled 2021-02-03: qty 200

## 2021-02-03 MED ORDER — MYCOPHENOLATE MOFETIL 250 MG PO CAPS
750.0000 mg | ORAL_CAPSULE | Freq: Two times a day (BID) | ORAL | Status: DC
  Administered 2021-02-04 – 2021-02-08 (×9): 750 mg via ORAL
  Filled 2021-02-03 (×10): qty 3

## 2021-02-03 MED ORDER — NEBIVOLOL HCL 5 MG PO TABS
5.0000 mg | ORAL_TABLET | Freq: Every day | ORAL | Status: DC
  Administered 2021-02-04 – 2021-02-05 (×2): 5 mg via ORAL
  Filled 2021-02-03 (×3): qty 1

## 2021-02-03 MED ORDER — AMIODARONE HCL IN DEXTROSE 360-4.14 MG/200ML-% IV SOLN
30.0000 mg/h | INTRAVENOUS | Status: DC
  Administered 2021-02-04 – 2021-02-05 (×4): 30 mg/h via INTRAVENOUS
  Filled 2021-02-03 (×4): qty 200

## 2021-02-03 MED ORDER — FUROSEMIDE 10 MG/ML IJ SOLN
80.0000 mg | Freq: Once | INTRAMUSCULAR | Status: AC
  Administered 2021-02-03: 80 mg via INTRAVENOUS
  Filled 2021-02-03: qty 8

## 2021-02-03 MED ORDER — SODIUM CHLORIDE 0.9 % IV SOLN
1.0000 g | Freq: Once | INTRAVENOUS | Status: AC
  Administered 2021-02-03: 1 g via INTRAVENOUS
  Filled 2021-02-03: qty 10

## 2021-02-03 MED ORDER — ACETAMINOPHEN 325 MG PO TABS
650.0000 mg | ORAL_TABLET | Freq: Four times a day (QID) | ORAL | Status: DC | PRN
  Administered 2021-02-03 – 2021-02-04 (×2): 650 mg via ORAL
  Filled 2021-02-03 (×3): qty 2

## 2021-02-03 MED ORDER — ATORVASTATIN CALCIUM 80 MG PO TABS
80.0000 mg | ORAL_TABLET | Freq: Every day | ORAL | Status: DC
  Administered 2021-02-04 – 2021-02-07 (×4): 80 mg via ORAL
  Filled 2021-02-03 (×4): qty 1

## 2021-02-03 MED ORDER — FAMOTIDINE 20 MG PO TABS
40.0000 mg | ORAL_TABLET | Freq: Every day | ORAL | Status: DC
  Administered 2021-02-04 – 2021-02-07 (×5): 40 mg via ORAL
  Filled 2021-02-03 (×5): qty 2

## 2021-02-03 MED ORDER — PREDNISONE 5 MG PO TABS
5.0000 mg | ORAL_TABLET | Freq: Every day | ORAL | Status: DC
  Administered 2021-02-04 – 2021-02-08 (×5): 5 mg via ORAL
  Filled 2021-02-03 (×5): qty 1

## 2021-02-03 MED ORDER — MAGNESIUM SULFATE 2 GM/50ML IV SOLN
2.0000 g | Freq: Once | INTRAVENOUS | Status: AC
  Administered 2021-02-03: 2 g via INTRAVENOUS
  Filled 2021-02-03: qty 50

## 2021-02-03 MED ORDER — AMIODARONE LOAD VIA INFUSION
150.0000 mg | Freq: Once | INTRAVENOUS | Status: AC
  Administered 2021-02-03: 150 mg via INTRAVENOUS
  Filled 2021-02-03: qty 83.34

## 2021-02-03 MED ORDER — DOXYCYCLINE HYCLATE 100 MG PO TABS
100.0000 mg | ORAL_TABLET | Freq: Once | ORAL | Status: AC
  Administered 2021-02-03: 100 mg via ORAL
  Filled 2021-02-03: qty 1

## 2021-02-03 MED ORDER — ACETAMINOPHEN 650 MG RE SUPP: 650.0000 mg | Freq: Four times a day (QID) | RECTAL | Status: DC | PRN

## 2021-02-03 NOTE — H&P (Addendum)
[facility] Admission History and Physical Service Pager: [phone]  Patient name: Roy Dyer Medical record number: [id number] Date of birth: November 28, 1965 Age: 55 y.o. Gender: male  Primary Care Provider: [name], MD Consultants: Cardiology Code Status: FULL code Preferred Emergency Contact: Leng Montesdeoca Wife [phone] Ask about Dry Weight:  Chief Complaint: Chest pain  Assessment and Plan: Roy Dyer is a 55 y.o. male presenting with chest pain and nausea. PMH is significant for severe premature CAD, ischemic CM/chronic systolic HF EF 60-63%, frequent PVCs, s/p AICD, SLE s/p renal transplant 2012, OSA on CPAP, HLD, HTN, prediabetes.  Chest Pain  HFrEF EF 30-35%  Pericarditis  Pneumonia Patient presents after event of chest pain radiating into his neck and left side of his upper back.  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 Patient was tachycardic to 109 and tachypneic to 28 on admission, with EKG demonstrating atrial flutter with age-indeterminate inferior and anterior infarct, with abnormal lateral Q waves. Labs demonstrated a glucose of 192 with a creatinine of 1.61 (baseline of 1.6), with a BNP of 1441.7 (792 4 months ago) and troponins of 21 and 20.  WBC was 12.5, and a urinalysis showing > 500 glucose. CXR demonstrated mild left lateral lung base opacity that was concerning for atelectasis or pneumonia. Physical exam demonstrated crackles in lower lung fields, with heart exam notable for pericardial knock, and pleuritic chest pain. Pulses were intact in all extremities with no edema.  Differential includes acute HF exacerbation given elevated BNP, and history of HFrEF. Pericarditis is also on the differential, given pleuritic chest pain and recent flu vaccination. Differential also includes MI given extensive history of MI's, but less likely with slightly elevated  troponins, and lack of acute EKG findings. CXR was concerning for pneumonia, and patient has elevated WBC, but has remained afebrile and denies any cough.  PE is also on the differential, given chest pain and tachypnea, but less likely given lack of hemoptysis or respiratory distress.  -Admit to [name], Attending Dr. Gwendlyn Deutscher - Cardiology consulted appreciate recommendations: Plan is to obtain TEE/cardioversion on Monday if he does not cardiovert with anemia -Colchicine 0.6 mg BID, avoid NSAIDs since patient s/p transplant with Scr  -Continue Farxiga 10 daily, Spironolactone 01.6 mg daily, Bystolic 5 mg daily, Entresto 49/51 BID -Lasix IV 80 mg x 1 -Doxycyline 100 mg / Ceftriaxone 1g; consider the need for continuing abx -Troponin's x 2 -CBC -CMP -Procalcitonin -Echocardiogram TEE  Atrial Flutter  Frequent PVCs w/ AICD Patient noted to be in atrial flutter on EKG.  Patient with history of frequent PVCs (11.8% per 8/22 Zio) with AICD. Patient AICD had 41 AT/AF episodes since 8/19.  Cardiology follow-up as above. -Continuous cardiac monitoring -Continue mexiletine 300 mg BID -IV Amiodarone, TEE/DCCV if not converted by Monday -Eliquis 17m -Echo -Check TSH -Mg 1.7, Replace Mg  Lupus  S/p Renal Transplant  Patient had renal transplant in 2012 and is currently on tacrolimus, mycophenolate, and prednisone.  Diagnosed at the age of 89with lupus. -Continue Tacrolimus level 0.5 mg -continue Mycophenolate 250 mg -Continue Prednisone 541m HLD Most recent lipid panel 06/2020. Tot 115, LDL 34, HDL 63. Patient taking Lipitor 80 mg and niacin 500 mg -Continue Lipitor - Hold niacin; has high potential for arrhythmias. Consider discontinuing at discharge.  HTN Patient blood pressure 128/73 on admission. Home meds include Entresto 49-51, -Continue home  meds  Hx of Prediabetes  significantly elevated glucose Patient Glc 192 on admission, with history of prediabetes last A1c 6.14 Dec 2019. UA with >  500 glc. -Obtain HgbA1c  GERD Patient taking Pepcid -Continue home med  OSA Uses CPAP nightly. Hgb 17.7 can consider other causes but suspect chronic hypoxia etiology.  -Continue CPAP qhs  FEN/GI: Heart Healthy Diet, NPO at 00:00 Prophylaxis: Eliquis  Disposition: Admit to [place], Attending Dr. Gwendlyn Deutscher  History of Present Illness:  Roy Dyer is a 55 y.o. male presenting with chest pain.   Received a flu shot Tuesday and started to feel funny Thursday. Started to have chest pain from mid chest to neck on Friday. Attempted to take a shower and it did not improve it. Told wife to call EMS.  Pain is better if he is sitting up. Worse with deep breath. Was told in ambulance that he was in afib which he has never been told. Saw Dr. Jeffie Pollock in august and was told he was too dry. His dry wt is 212-213 lbs. Has Lupus diagnosed at 55 yr old, kidney issues as complication. 8 yrs ago kidney transplant from niece 04/2020. CHF, bilateral hip replacement due to chronic steroid use.   ETOH- over 39yr Tobacco- none since 2014  Review Of Systems: Per HPI with the following additions:   Review of Systems  Constitutional:  Negative for activity change, appetite change and fever.  Respiratory:  Negative for cough and shortness of breath.   Cardiovascular:  Positive for chest pain. Negative for leg swelling.  Gastrointestinal:  Positive for abdominal distention and nausea. Negative for abdominal pain.  Genitourinary:  Negative for difficulty urinating.  Musculoskeletal:  Positive for neck pain.  Psychiatric/Behavioral:  The patient is nervous/anxious.     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   Chronic renal insufficiency, stage II (mild)    Cough with hemoptysis 07/15/2016    Hemoptysis 07/15/2016   Barrett's esophagus 04/17/2016   Immunosuppression ([facility] 04/17/2016   GERD (gastroesophageal reflux disease) 12/31/2013   History of renal transplant 09/02/2013   S/P coronary artery stent placement 12/24/2011   S/P ICD (internal cardiac defibrillator) procedure 12/24/2011   Heart failure ([facility] 12/24/2011   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[facility] 12/24/2011   Coronary artery disease with exertional angina ([facility] 09/12/2011   Ulnar neuropathy of both upper extremities 07/30/2011   Raynaud's disease 06/25/2011   Automatic implantable cardioverter-defibrillator in situ 03/07/2011   Erectile dysfunction 10/23/2010   Mixed hyperlipidemia 08/10/2009   Essential hypertension, benign 08/10/2009   ischemic cardiomyopathy status post anterolateral MI 02/01/2009   LUPUS 02/01/2009    Past Medical History: Past Medical History:  Diagnosis Date   AICD (automatic cardioverter/defibrillator) present    [name] remotely-yrly checks, Dr. Nishan,cardiology   Avascular necrosis of bone of hip ([facility] 2010 surg   Left hip arthroplasty: from chronic systemic steroids taken for his Lupus   Barrett's esophagus 2017; 2021   Blood transfusion without reported diagnosis 2010, 2012   CAD (coronary artery disease)    **DAPT long term recommended by cards**.  a. stents RCA/Circ 2001 b. BMS to LAD 07/2010  c. 01/2017: s/p DES to RCA.    Cataract    left eye   CHF (congestive heart  failure) ([place])    Chronic headaches 02/2017   As of 01/2017, HA's no better, neurologist ordered CT.  ESR normal at that time.  HA'S COMPLETELY RESOLVED AFTER HE GOT ON CPAP 2019/2020.   Chronic renal insufficiency, stage 3 (moderate) (HCC)    GFR 55-60 (sCr 1.4-1.7 range)   Erythrocytosis    Pre and post transplant->on losartan for this   GERD (gastroesophageal reflux disease)    Hx of esoph stricture and dilatation.  +Barrett's esophagus+hx of aspiration pneumonia-->to get rpt EGD when he  comes off DAPT (utd as of 01/29/18)   Gout    s/p renal transplant he was weened off of his allopurinol.   Hiatal hernia    History of adenomatous polyp of colon 09/2019   polyp x 1. Recall 5 yrs   History of end stage renal disease 04/24/2011   Secondary to SLE: HD 06/2011-10/2012 (then got renal transplant)   History of renal transplant 11/10/2012   10/2012 [facility]    Hyperlipidemia    Hypertension    implantable cardiac defibrillator single chamber    [employer]tr[employer] (due to low EF)   Ischemic cardiomyopathy    Chronic systolic dysfunction--  29/5621 EF 30-35%.   Single chamber ICD 11/20/10 ([name])   Left ventricular thrombus 2012   Re-eval 02/2011 showed thrombus RESOLVED, so coumadin was d/c'd (was on it for 37mo   Lupus ([facility]    OSA on CPAP 2018   Dr. [name]9/2018: +OSA on home sleep studay; CPAP auto titrate 5-20 started 05/2017.   Osteoporosis    Prediabetes 12/2019   A1c stable long term at 5.6-5.7 until jump up to 6.2% Aug 2021.    Past Surgical History: Past Surgical History:  Procedure Laterality Date   AV FISTULA PLACEMENT  03/28/2011   Procedure: ARTERIOVENOUS (AV) FISTULA CREATION;  Surgeon: TRosetta Posner MD;  [facility] [facility]  Service: Vascular;  Laterality: Left;  Creation of Left radiocephallic cimino fistula   AV FISTULA PLACEMENT  04/27/2011   Procedure: ARTERIOVENOUS (AV) FISTULA CREATION;  Surgeon: TRosetta Posner MD;  [place] [facility] [place]  Service: Vascular;  Laterality: Left;  left basilic vein transposition   BIOPSY  09/14/2019   Procedure: BIOPSY;  Surgeon: PJerene Bears MD;  [facility];  Service: Gastroenterology;;   CARDIAC CATHETERIZATION  08/2010; 04/22/20   08/2010; 04/2020->mild/mod in-stent restenosis but none hemodynamically signif, severe LV dysfxn, frequenct PVCs-->referred to HF clinic for optimization of med mgmt   CARDIOVASCULAR STRESS TEST  07/2010; 03/2014   03/2014 showed large old infarct and EF 30-35% but no ischemia   COLONOSCOPY  09/14/2019    Adenoma x 1.  Recall 5 yrs   COLONOSCOPY WITH PROPOFOL N/A 09/14/2019   Procedure: COLONOSCOPY WITH PROPOFOL;  Surgeon: PJerene Bears MD;  [facility];  Service: Gastroenterology;  Laterality: N/A;   CORONARY PRESSURE WIRE/FFR WITH 3D MAPPING N/A 04/22/2020   Procedure: Coronary Pressure Wire/FFR w/3D Mapping;  Surgeon: AWellington Hampshire MD;  [facility];  Service: Cardiovascular;  Laterality: N/A;   DEXA  07/05/2017   Normal bone density in radius and spine.   ESOPHAGOGASTRODUODENOSCOPY  02/2009   Done by Dr. SFuller Planfor hematemesis; esophagitis found.  Repeat recommended 09/2016, at which time he will also likely get his initial screening colonoscopy.   ESOPHAGOGASTRODUODENOSCOPY  09/14/2019   esophagitis (improved compared to 2017 EGD), Barrett's.  Stomach and duodenum normal.   ESOPHAGOGASTRODUODENOSCOPY (EGD) WITH PROPOFOL N/A 09/29/2015   REPEAT EGD  RECOMMENDED 09/2016.  Barrett's esoph + mild chronic gastritis.  H pylori NEG. Procedure: ESOPHAGOGASTRODUODENOSCOPY (EGD) WITH PROPOFOL;  Surgeon: Jerene Bears, MD;  [facility];  Service: Gastroenterology;  Laterality: N/A;   ESOPHAGOGASTRODUODENOSCOPY (EGD) WITH PROPOFOL N/A 09/14/2019   Procedure: ESOPHAGOGASTRODUODENOSCOPY (EGD) WITH PROPOFOL;  Surgeon: Jerene Bears, MD;  [facility];  Service: Gastroenterology;  Laterality: N/A;   ICD GENERATOR CHANGEOUT N/A 04/11/2018   Procedure: ICD GENERATOR CHANGEOUT;  Surgeon: Deboraha Sprang, MD;  Location: [place] [facility];  Service: Cardiovascular;  Laterality: N/A;   INSERT / REPLACE / REMOVE PACEMAKER     medtronic        dr [name]    [place]   icd only   KIDNEY TRANSPLANT  10/2012   [facility] nephrologist--Dr. Blair Heys   LEFT HEART CATH AND CORONARY ANGIOGRAPHY N/A 02/08/2017   PCA and DES to mRCA--needs DAPT for at least 6 mo.   Procedure: LEFT HEART CATH AND CORONARY ANGIOGRAPHY;  Surgeon: Sherren Mocha, MD;  Location: [place]  [facility];  Service: Cardiovascular;  Laterality: N/A;   LEFT HEART CATH AND CORONARY ANGIOGRAPHY N/A 04/22/2020   Procedure: LEFT HEART CATH AND CORONARY ANGIOGRAPHY;  Surgeon: Wellington Hampshire, MD;  Location: [place] [facility];  Service: Cardiovascular;  Laterality: N/A;   PARTIAL HIP ARTHROPLASTY Left 2010   left   POLYPECTOMY  09/14/2019   Procedure: POLYPECTOMY;  Surgeon: Jerene Bears, MD;  [facility];  Service: Gastroenterology;;   RENAL BIOPSY     stents     05-2010 and 2- in 2010 and 1 in 2018.   TOTAL HIP ARTHROPLASTY  07/09/2011   Procedure: TOTAL HIP ARTHROPLASTY;  Surgeon: Kerin Salen, MD;  Location: [place];  Service: Orthopedics;  Laterality: Right;   TRANSTHORACIC ECHOCARDIOGRAM  10/13/2020   EF 30-35% (slight improved), grd III DD, elev PA pressure, mold/mod tricusp regurg   TYMPANOSTOMY TUBE PLACEMENT  55 yrs old   US ECHOCARDIOGRAPHY  12/2010; 02/2014;08/2014;08/2019   02/2014 EF still 25-30%, increased PA pressures.  2016 EF 30-35%, sept/inf hypokinesis, mod tricusp regurg. 08/2019 EF 25-30%, global hypok, sev dil LV w/apical aneur, grd III DD, mod tricus reg, mod pulm htn   ZIO PATCH  04/2020   HIGH PVC BURDEN.  NSVT, SVT, BBB.    Social History: Social History   Tobacco Use   Smoking status: Former    Packs/day: 0.50    Years: 30.00    Pack years: 15.00    Types: Cigarettes    Quit date: 08/02/2010    Years since quitting: 10.5   Smokeless tobacco: Never  Vaping Use   Vaping Use: Never used  Substance Use Topics   Alcohol use: No    Alcohol/week: 0.0 standard drinks   Drug use: No   Additional social history:   Please also refer to relevant sections of EMR.  Family History: Family History  Problem Relation Age of Onset   Kidney failure Mother    Lupus Mother    Stroke Mother    Diabetes Mother        type 2   Heart attack Father        X 7   Hypertension Father    Hyperlipidemia Father    Heart attack Sister 86       X 1    Hyperlipidemia Sister    Hypertension Sister    Heart disease Sister    Heart attack Paternal Grandfather  Heart disease Paternal Aunt    Heart disease Paternal Uncle    Colon cancer Neg Hx    Esophageal cancer Neg Hx    Stomach cancer Neg Hx    Rectal cancer Neg Hx     Allergies and Medications: Allergies  Allergen Reactions   Triptans Palpitations    Reaction to Max[place]   No current facility-administered medications on file prior to encounter.   Current Outpatient Medications on File Prior to Encounter  Medication Sig Dispense Refill   acetaminophen (TYLENOL) 500 MG tablet Take 1,000 mg by mouth every 6 (six) hours as needed (for pain.).     allopurinol (ZYLOPRIM) 100 MG tablet Take 1 tablet (100 mg total) by mouth daily. 90 tablet 3   aspirin EC 81 MG tablet Take 81 mg by mouth daily.     atorvastatin (LIPITOR) 80 MG tablet Take 80 mg by mouth at bedtime.     BYSTOLIC 5 MG tablet TAKE 1 TABLET BY MOUTH DAILY 90 tablet 3   clopidogrel (PLAVIX) 75 MG tablet TAKE 1 TABLET BY MOUTH DAILY 90 tablet 3   dapagliflozin propanediol (FARXIGA) 10 MG TABS tablet Take 1 tablet (10 mg total) by mouth daily before breakfast. 90 tablet 0   DEXILANT 60 MG capsule TAKE 1 CAPSULE BY MOUTH DAILY BEFORE BREAKFAST 90 capsule 0   famotidine (PEPCID) 20 MG tablet Take 40 mg by mouth at bedtime.      fluticasone (FLONASE) 50 MCG/ACT nasal spray Place 1 spray into both nostrils daily.     magnesium oxide (MAG-OX) 400 MG tablet Take 400 mg by mouth daily.      mexiletine (MEXITIL) 150 MG capsule Take 2 capsules (300 mg total) by mouth 2 (two) times daily. 360 capsule 3   mycophenolate (CELLCEPT) 250 MG capsule Take 3 capsules (750 mg total) by mouth 2 (two) times daily. 540 capsule 1   niacin 500 MG tablet Take 1,000 mg by mouth at bedtime.     nitroGLYCERIN (NITROSTAT) 0.4 MG SL tablet Place 1 tablet (0.4 mg total) under the tongue every 5 (five) minutes as needed for chest pain (DO NOT EXCEED 3  DOSES). 25 tablet 3   predniSONE (DELTASONE) 5 MG tablet TAKE 1 TABLET BY MOUTH DAILY 90 tablet 3   sacubitril-valsartan (ENTRESTO) 49-51 MG Take 1 tablet by mouth 2 (two) times daily. 180 tablet 3   spironolactone (ALDACTONE) 25 MG tablet Take 0.5 tablets (12.5 mg total) by mouth daily. (Patient not taking: Reported on 01/31/2021) 45 tablet 3   tacrolimus (PROGRAF) 0.5 MG capsule Take 0.5 mg by mouth every 12 (twelve) hours.      traMADol (ULTRAM) 50 MG tablet Take 50 mg by mouth every 6 (six) hours as needed. for pain  0    Objective: BP 125/77   Pulse (!) 108   Temp 98.2 F (36.8 C) (Oral)   Resp (!) 27   Ht 5' 7"  (1.702 m)   Wt 97.5 kg   SpO2 97%   BMI 33.67 kg/m  Exam: General: Comfortable, white male, no acute signs of distress Eyes: Clear conjunctiva, PERRL ENTM: Moist mucous membranes Neck: Gross ROM intact Cardiovascular: Pericardial knock, nrmg Respiratory: Bilateral crackles in lower lung fields Gastrointestinal: Abdomen soft, non-tender MSK: Moving all extremities independently Derm: No rashes or lesions Neuro: Alert and interactive Psych: Good mood, polite affect  Labs and Imaging: CBC BMET  Recent Labs  Lab 02/03/21 1328  WBC 12.5*  HGB 16.9  HCT 53.0*  PLT  170   Recent Labs  Lab 02/03/21 1328  NA 137  K 4.9  CL 104  CO2 26  BUN 20  CREATININE 1.61*  GLUCOSE 192*  CALCIUM 9.1     EKG: Atrial Flutter   CXR: IMPRESSION: 1. Mild left lateral lung base opacity, which could represent atelectasis or pneumonia. Dedicated PA and lateral radiographs could better characterize if clinically indicated. 2. Similar cardiomegaly.     Holley Bouche, MD 02/03/2021, 8:01 PM PGY-1, [place] [facility] pager: [phone], text pages welcome  [name] Upper-Level Resident Addendum   I have independently interviewed and examined the patient. I have discussed the above with the original author and agree with their documentation. My edits for  correction/addition/clarification are in the document. Please see also any attending notes.   Gerlene Fee, DO PGY-3, [facility] 02/04/2021 12:07 AM  [name] Service pager: [phone] (text pages welcome through [facility])

## 2021-02-03 NOTE — ED Notes (Signed)
This RN spoke with [employer] rep who stated they would fax over the report for pt's ICD. Per [employer], pt has had 41 atrial arrhythmias since the device was last cleared on 8/19, with the most recent still in progress. Pt has been in some form of arrhythmias 2.4% of the time since 8/19. Per [employer] the report also showed possible fluid overload with fluid trending up. This report is being faxed to [place]. This RN will continue to monitor.

## 2021-02-03 NOTE — ED Notes (Signed)
This RN spoke with [employer] rep and per [employer] rep they are unsure of the exact time but that the device started capturing the arrhythmia on 02/01/2021.

## 2021-02-03 NOTE — Progress Notes (Signed)
[name] Interim Progress Note  Went to check on patient given admission arrived around time of sign out. Patient laying comfortably in bed, in no acute distress. He is appropriately conversational. I explained to him that the night team will be seeing him shortly to do a formal evaluation, he voiced understanding.   Donney Dice, DO 02/03/2021, 7:00 PM PGY-2, [place] Service pager [phone]

## 2021-02-03 NOTE — ED Triage Notes (Signed)
Pt BIB [facility] from home c/o CP. Pt states yesterday he wasn't feeling right but woke up this morning with CP that also radiates to his left shoulder and neck area. Per EMS pt is in A-fib but per pt he has never been told he has A-fib. Pt is left arm restricted. Pt had taken 324 of ASA and 1 Nitro before EMS arrival.

## 2021-02-03 NOTE — ED Provider Notes (Signed)
  Physical Exam  BP 120/87   Pulse 73   Temp 98.2 F (36.8 C) (Oral)   Resp (!) 28   Ht 5\' 7"  (1.702 m)   Wt 97.5 kg   SpO2 94%   BMI 33.67 kg/m   Physical Exam  ED Course/Procedures   Clinical Course as of 02/05/21 1329  Fri Feb 03, 2021  1802 I spoke to the family medicine team who will admit the patient. [AW]    Clinical Course User Index [AW] Arnaldo Natal, MD    Procedures  MDM  Shift hand-off: awaiting cardiology consult. Dr. Sung Amabile recommends medicine hospitalize the patient with cardiology following.       Arnaldo Natal, MD 02/05/21 1329

## 2021-02-03 NOTE — ED Notes (Signed)
Admitting paged to [place] per her request

## 2021-02-03 NOTE — Consult Note (Addendum)
Advanced Heart Failure Team Consult Note   Primary Physician: [name], MD PCP-Cardiologist:  [name], MD  Reason for Consultation: New onset atrial flutter and acute on chronic systolic HF   HPI:    Roy Dyer is seen today for evaluation of new onset atrial flutter and acute on chronic systolic HF at the request of Dr. Doren Custard EM.   He is a 55 y.o male with a severe premature CAD, SLE s/p renal transplant 2012 ([facility]), OSA on CPAP and systolic HF EF 14%/ERXVQMGQ CM s/p ICD.  His first MI was in 2001 with stenting to the RCA and OM. Had BMS to LAD in 2012. Catheterization 9/18 showed 90% lesion in mRCA -> DES, LAD and OM stents patent.   Echocardiogram 12/17 EF 30%  Echo 4/21 EF 25- 30% with severe LAE with mild MR and moderate TR.   Cath 12/21 for worsening HF symptoms. Stable CAD. With frequent PVCs. No RHC   Zio 12/21: PVCs 13.4%  Echo 6/22 EF 30-35% Apex akinetic. G3 DD RV ok  Zio 08/22: PVCs 11.8% Four runs Non-sustained VT, longest 10 beats  Patient reports feeling weak and lethargic shortly after waking up yesterday. Noticed pulse was higher. This morning developed constant chest pain radiating to shoulders and back. Worse with inspiration and lying back, better sitting up. No fever, chills or sick contacts.   He presented to the ED this afternoon d/t worsening symptoms. Flu vaccine 3 days ago. He appeared tachypenic.  BP stable, 02 sats okay on RA.  No fever. ECG demonstrated atrial flutter with ventricular rate 110, no new ischemic changes. HS troponin 21. WBC 12.5, Scr 1.61, HC03 26, K 4.9, LFTs okay, Mag 1.7. BNP 1,441 (792 4 months ago). Negative for COVID. Chest x-ray with left lung bases opacity which could be consistent with atelectasis or pneumonia. Negative for COVID. UA pending.  ICD interrogation: 41 AT/AF episodes since last check 08/19 (2.4% burden). Onset of current episode not known. OptiVol trending up. (Formal report not yet  available)  Review of Systems: [y] = yes, [ ]  = no   General: Weight gain [ ] ; Weight loss [ ] ; Anorexia [ ] ; Fatigue [Y]; Fever [ ] ; Chills [ ] ; Weakness [Y]  Cardiac: Chest pain/pressure [Y]; Resting SOB [Y]; Exertional SOB [ ] ; Orthopnea [ ] ; Pedal Edema [ ] ; Palpitations [ ] ; Syncope [ ] ; Presyncope [ ] ; Paroxysmal nocturnal dyspnea[ ]   Pulmonary: Cough [ ] ; Wheezing[ ] ; Hemoptysis[ ] ; Sputum [ ] ; Snoring [ ]   GI: Vomiting[ ] ; Dysphagia[ ] ; Melena[ ] ; Hematochezia [ ] ; Heartburn[ ] ; Abdominal pain [ ] ; Constipation [ ] ; Diarrhea [ ] ; BRBPR [ ]   GU: Hematuria[ ] ; Dysuria [ ] ; Nocturia[ ]   Vascular: Pain in legs with walking [ ] ; Pain in feet with lying flat [ ] ; Non-healing sores [ ] ; Stroke [ ] ; TIA [ ] ; Slurred speech [ ] ;  Neuro: Headaches[ ] ; Vertigo[ ] ; Seizures[ ] ; Paresthesias[ ] ;Blurred vision [ ] ; Diplopia [ ] ; Vision changes [ ]   Ortho/Skin: Arthritis [ ] ; Joint pain [ ] ; Muscle pain [ ] ; Joint swelling [ ] ; Back Pain [ ] ; Rash [ ]   Psych: Depression[ ] ; Anxiety[ ]   Heme: Bleeding problems [ ] ; Clotting disorders [ ] ; Anemia [ ]   Endocrine: Diabetes [ ] ; Thyroid dysfunction[ ]   Home Medications Prior to Admission medications   Medication Sig Start Date End Date Taking? Authorizing Provider  acetaminophen (TYLENOL) 500 MG tablet Take 1,000 mg by mouth  every 6 (six) hours as needed (for pain.).    [provider]  allopurinol (ZYLOPRIM) 100 MG tablet Take 1 tablet (100 mg total) by mouth daily. 03/22/16   McGowenAdrian Blackwater, MD  aspirin EC 81 MG tablet Take 81 mg by mouth daily.    [provider]  atorvastatin (LIPITOR) 80 MG tablet Take 80 mg by mouth at bedtime.    [provider]  BYSTOLIC 5 MG tablet TAKE 1 TABLET BY MOUTH DAILY 08/16/16   McGowen, [name], MD  clopidogrel (PLAVIX) 75 MG tablet TAKE 1 TABLET BY MOUTH DAILY 08/16/16   McGowen, [name], MD  dapagliflozin propanediol (FARXIGA) 10 MG TABS tablet Take 1 tablet (10 mg total) by mouth daily  before breakfast. 09/20/20   McGowen, [name], MD  DEXILANT 60 MG capsule TAKE 1 CAPSULE BY MOUTH DAILY BEFORE BREAKFAST 09/25/16   Pyrtle, Lajuan Lines, MD  famotidine (PEPCID) 20 MG tablet Take 40 mg by mouth at bedtime.     [provider]  fluticasone (FLONASE) 50 MCG/ACT nasal spray Place 1 spray into both nostrils daily.    [provider]  magnesium oxide (MAG-OX) 400 MG tablet Take 400 mg by mouth daily.     [provider]  mexiletine (MEXITIL) 150 MG capsule Take 2 capsules (300 mg total) by mouth 2 (two) times daily. 09/23/20   Jennetta Flood, Shaune Pascal, MD  mycophenolate (CELLCEPT) 250 MG capsule Take 3 capsules (750 mg total) by mouth 2 (two) times daily. 12/23/16   McGowen, [name], MD  niacin 500 MG tablet Take 1,000 mg by mouth at bedtime.    [provider]  nitroGLYCERIN (NITROSTAT) 0.4 MG SL tablet Place 1 tablet (0.4 mg total) under the tongue every 5 (five) minutes as needed for chest pain (DO NOT EXCEED 3 DOSES). 11/23/19   Josue Hector, MD  predniSONE (DELTASONE) 5 MG tablet TAKE 1 TABLET BY MOUTH DAILY 10/24/16   McGowen, [name], MD  sacubitril-valsartan (ENTRESTO) 49-51 MG Take 1 tablet by mouth 2 (two) times daily. 09/23/20   Zaela Graley, Shaune Pascal, MD  spironolactone (ALDACTONE) 25 MG tablet Take 0.5 tablets (12.5 mg total) by mouth daily. Patient not taking: Reported on 01/31/2021 12/30/20 03/30/21  Krystalle Pilkington, Shaune Pascal, MD  tacrolimus (PROGRAF) 0.5 MG capsule Take 0.5 mg by mouth every 12 (twelve) hours.     [provider]  traMADol (ULTRAM) 50 MG tablet Take 50 mg by mouth every 6 (six) hours as needed. for pain 06/06/17   [provider]    Past Medical History: Past Medical History:  Diagnosis Date   AICD (automatic cardioverter/defibrillator) present    Dr. Paschal Dopp follows remotely-yrly checks, Dr. Nishan,cardiology   Avascular necrosis of bone of hip ([facility]) 2010 surg   Left hip arthroplasty: from chronic systemic steroids taken  for his Lupus   Barrett's esophagus 2017; 2021   Blood transfusion without reported diagnosis 2010, 2012   CAD (coronary artery disease)    **DAPT long term recommended by cards**.  a. stents RCA/Circ 2001 b. BMS to LAD 07/2010  c. 01/2017: s/p DES to RCA.    Cataract    left eye   CHF (congestive heart failure) (HCC)    Chronic headaches 02/2017   As of 01/2017, HA's no better, neurologist ordered CT.  ESR normal at that time.  HA'S COMPLETELY RESOLVED AFTER HE GOT ON CPAP 2019/2020.   Chronic renal insufficiency, stage 3 (moderate) (HCC)    GFR 55-60 (  sCr 1.4-1.7 range)   Erythrocytosis    Pre and post transplant->on losartan for this   GERD (gastroesophageal reflux disease)    Hx of esoph stricture and dilatation.  +Barrett's esophagus+hx of aspiration pneumonia-->to get rpt EGD when he comes off DAPT (utd as of 01/29/18)   Gout    s/p renal transplant he was weened off of his allopurinol.   Hiatal hernia    History of adenomatous polyp of colon 09/2019   polyp x 1. Recall 5 yrs   History of end stage renal disease 04/24/2011   Secondary to SLE: HD 06/2011-10/2012 (then got renal transplant)   History of renal transplant 11/10/2012   10/2012 [facility]    Hyperlipidemia    Hypertension    implantable cardiac defibrillator single chamber    Medtronic (due to low EF)   Ischemic cardiomyopathy    Chronic systolic dysfunction--  37/8588 EF 30-35%.   Single chamber ICD 11/20/10 ([name])   Left ventricular thrombus 2012   Re-eval 02/2011 showed thrombus RESOLVED, so coumadin was d/c'd (was on it for 62mo   Lupus ([place]    OSA on CPAP 2018   Dr. [name]9/2018: +OSA on home sleep studay; CPAP auto titrate 5-20 started 05/2017.   Osteoporosis    Prediabetes 12/2019   A1c stable long term at 5.6-5.7 until jump up to 6.2% Aug 2021.    Past Surgical History: Past Surgical History:  Procedure Laterality Date   AV FISTULA PLACEMENT  03/28/2011   Procedure: ARTERIOVENOUS (AV) FISTULA CREATION;   Surgeon: TRosetta Posner MD;  [place]  Service: Vascular;  Laterality: Left;  Creation of Left radiocephallic cimino fistula   AV FISTULA PLACEMENT  04/27/2011   Procedure: ARTERIOVENOUS (AV) FISTULA CREATION;  Surgeon: TRosetta Posner MD;  [place]  Service: Vascular;  Laterality: Left;  left basilic vein transposition   BIOPSY  09/14/2019   Procedure: BIOPSY;  Surgeon: PJerene Bears MD;  [facility];  Service: Gastroenterology;;   CARDIAC CATHETERIZATION  08/2010; 04/22/20   08/2010; 04/2020->mild/mod in-stent restenosis but none hemodynamically signif, severe LV dysfxn, frequenct PVCs-->referred to HF clinic for optimization of med mgmt   CARDIOVASCULAR STRESS TEST  07/2010; 03/2014   03/2014 showed large old infarct and EF 30-35% but no ischemia   COLONOSCOPY  09/14/2019   Adenoma x 1.  Recall 5 yrs   COLONOSCOPY WITH PROPOFOL N/A 09/14/2019   Procedure: COLONOSCOPY WITH PROPOFOL;  Surgeon: PJerene Bears MD;  [facility];  Service: Gastroenterology;  Laterality: N/A;   CORONARY PRESSURE WIRE/FFR WITH 3D MAPPING N/A 04/22/2020   Procedure: Coronary Pressure Wire/FFR w/3D Mapping;  Surgeon: AWellington Hampshire MD;  [facility];  Service: Cardiovascular;  Laterality: N/A;   DEXA  07/05/2017   Normal bone density in radius and spine.   ESOPHAGOGASTRODUODENOSCOPY  02/2009   Done by Dr. SFuller Planfor hematemesis; esophagitis found.  Repeat recommended 09/2016, at which time he will also likely get his initial screening colonoscopy.   ESOPHAGOGASTRODUODENOSCOPY  09/14/2019   esophagitis (improved compared to 2017 EGD), Barrett's.  Stomach and duodenum normal.   ESOPHAGOGASTRODUODENOSCOPY (EGD) WITH PROPOFOL N/A 09/29/2015   REPEAT EGD RECOMMENDED 09/2016.  Barrett's esoph + mild chronic gastritis.  H pylori NEG. Procedure: ESOPHAGOGASTRODUODENOSCOPY (EGD) WITH PROPOFOL;  Surgeon: JJerene Bears MD;  [facility];  Service: Gastroenterology;   Laterality: N/A;   ESOPHAGOGASTRODUODENOSCOPY (EGD) WITH PROPOFOL N/A 09/14/2019   Procedure: ESOPHAGOGASTRODUODENOSCOPY (EGD) WITH PROPOFOL;  Surgeon: Jerene Bears, MD;  [facility];  Service: Gastroenterology;  Laterality: N/A;   ICD GENERATOR CHANGEOUT N/A 04/11/2018   Procedure: ICD GENERATOR CHANGEOUT;  Surgeon: Deboraha Sprang, MD;  Location: [place] [facility];  Service: Cardiovascular;  Laterality: N/A;   INSERT / REPLACE / REMOVE PACEMAKER     medtronic        dr [name]    [place]   icd only   KIDNEY TRANSPLANT  10/2012   [facility] nephrologist--Dr. Blair Heys   LEFT HEART CATH AND CORONARY ANGIOGRAPHY N/A 02/08/2017   PCA and DES to mRCA--needs DAPT for at least 6 mo.   Procedure: LEFT HEART CATH AND CORONARY ANGIOGRAPHY;  Surgeon: Sherren Mocha, MD;  Location: [place] [facility];  Service: Cardiovascular;  Laterality: N/A;   LEFT HEART CATH AND CORONARY ANGIOGRAPHY N/A 04/22/2020   Procedure: LEFT HEART CATH AND CORONARY ANGIOGRAPHY;  Surgeon: Wellington Hampshire, MD;  Location: [place] [facility];  Service: Cardiovascular;  Laterality: N/A;   PARTIAL HIP ARTHROPLASTY Left 2010   left   POLYPECTOMY  09/14/2019   Procedure: POLYPECTOMY;  Surgeon: Jerene Bears, MD;  [facility];  Service: Gastroenterology;;   RENAL BIOPSY     stents     05-2010 and 2- in 2010 and 1 in 2018.   TOTAL HIP ARTHROPLASTY  07/09/2011   Procedure: TOTAL HIP ARTHROPLASTY;  Surgeon: Kerin Salen, MD;  Location: [place];  Service: Orthopedics;  Laterality: Right;   TRANSTHORACIC ECHOCARDIOGRAM  10/13/2020   EF 30-35% (slight improved), grd III DD, elev PA pressure, mold/mod tricusp regurg   TYMPANOSTOMY TUBE PLACEMENT  55 yrs old   US ECHOCARDIOGRAPHY  12/2010; 02/2014;08/2014;08/2019   02/2014 EF still 25-30%, increased PA pressures.  2016 EF 30-35%, sept/inf hypokinesis, mod tricusp regurg. 08/2019 EF 25-30%, global hypok, sev dil LV w/apical aneur, grd III DD, mod tricus reg, mod pulm  htn   ZIO PATCH  04/2020   HIGH PVC BURDEN.  NSVT, SVT, BBB.    Family History: Family History  Problem Relation Age of Onset   Kidney failure Mother    Lupus Mother    Stroke Mother    Diabetes Mother        type 2   Heart attack Father        X 7   Hypertension Father    Hyperlipidemia Father    Heart attack Sister 50       X 1   Hyperlipidemia Sister    Hypertension Sister    Heart disease Sister    Heart attack Paternal Grandfather    Heart disease Paternal Aunt    Heart disease Paternal Uncle    Colon cancer Neg Hx    Esophageal cancer Neg Hx    Stomach cancer Neg Hx    Rectal cancer Neg Hx     Social History: Social History   Socioeconomic History   Marital status: Married    Spouse name: Not on file   Number of children: 2   Years of education: Not on file   Highest education level: Not on file  Occupational History   Occupation: CAD Drafter     Employer: [employer]  Tobacco Use   Smoking status: Former    Packs/day: 0.50    Years: 30.00    Pack years: 15.00    Types: Cigarettes    Quit date: 08/02/2010    Years since quitting: 10.5   Smokeless tobacco: Never  Vaping Use   Vaping Use: Never used  Substance and Sexual Activity   Alcohol use: No    Alcohol/week: 0.0 standard drinks   Drug use: No   Sexual activity: Yes    Partners: Female  Other Topics Concern   Not on file  Social History Narrative   Married, one adult daughter and one adult son.    Occupation: [email or web address]   5 pack-yr smoking hx, quit 07/2010.   Drug Use - noNo alcohol.   Social Determinants of Health   Financial Resource Strain: Not on file  Food Insecurity: Not on file  Transportation Needs: Not on file  Physical Activity: Not on file  Stress: Not on file  Social Connections: Not on file    Allergies:  Allergies  Allergen Reactions   Triptans Palpitations    Reaction to Maxalt    Objective:    Vital Signs:   Temp:  [98.2 F (36.8 C)] 98.2 F (36.8 C)  (09/23 1215) Pulse Rate:  [49-109] 106 (09/23 1545) Resp:  [8-29] 24 (09/23 1545) BP: (110-128)/(72-92) 110/72 (09/23 1545) SpO2:  [96 %-98 %] 98 % (09/23 1545) Weight:  [97.5 kg] 97.5 kg (09/23 1216)    Weight change: Filed Weights   02/03/21 1216  Weight: 97.5 kg    Intake/Output:   Intake/Output Summary (Last 24 hours) at 02/03/2021 1611 Last data filed at 02/03/2021 1559 Gross per 24 hour  Intake --  Output 500 ml  Net -500 ml      Physical Exam    General:  Appears comfortable, taking shallow breaths HEENT: normal Neck: supple. JVP ~ 10 cm . Carotids 2+ bilat; no bruits. No lymphadenopathy or thyromegaly appreciated. Cor: PMI nondisplaced. Rhythm irregular. Tachycardic. No rubs, gallops or murmurs. Lungs: clear Abdomen: soft, nontender, nondistended. No hepatosplenomegaly. No bruits or masses. Good bowel sounds. Extremities: no cyanosis, clubbing, rash, edema Neuro: alert & orientedx3, cranial nerves grossly intact. moves all 4 extremities w/o difficulty. Affect pleasant   Telemetry   Atrial flutter, rate 110s  EKG    Atrial flutter with ventricular rate 110, inferior and anterior Q waves  Labs   Basic Metabolic Panel: Recent Labs  Lab 02/03/21 1315 02/03/21 1328  NA 138 137  K 4.8 4.9  CL 102 104  CO2  --  26  GLUCOSE 185* 192*  BUN 22* 20  CREATININE 1.60* 1.61*  CALCIUM  --  9.1  MG  --  1.7    Liver Function Tests: Recent Labs  Lab 02/03/21 1328  AST 17  ALT 14  ALKPHOS 92  BILITOT 0.7  PROT 5.6*  ALBUMIN 3.5   Recent Labs  Lab 02/03/21 1328  LIPASE 28   No results for input(s): AMMONIA in the last 168 hours.  CBC: Recent Labs  Lab 02/03/21 1315 02/03/21 1328  WBC  --  12.5*  HGB 17.7* 16.9  HCT 52.0 53.0*  MCV  --  92.7  PLT  --  170    Cardiac Enzymes: No results for input(s): CKTOTAL, CKMB, CKMBINDEX, TROPONINI in the last 168 hours.  BNP: BNP (last 3 results) Recent Labs    05/19/20 0958 09/07/20 1047  02/03/21 1329  BNP 1,456.0* 792.8* 1,441.7*    ProBNP (last 3 results) No results for input(s): PROBNP in the last 8760 hours.   CBG: No results for input(s): GLUCAP in the last 168 hours.  Coagulation Studies: No results for input(s): LABPROT, INR in the last 72 hours.   Imaging  DG Chest Portable 1 View  Result Date: 02/03/2021 CLINICAL DATA:  Chest pain. EXAM: PORTABLE CHEST 1 VIEW COMPARISON:  05/18/2017. FINDINGS: Similar enlarged cardiac silhouette. Similar right subclavian approach single lead cardiac rhythm maintenance device. Mild left lateral basilar opacity. No definite pleural effusions. No visible pneumothorax. No acute osseous abnormality. IMPRESSION: 1. Mild left lateral lung base opacity, which could represent atelectasis or pneumonia. Dedicated PA and lateral radiographs could better characterize if clinically indicated. 2. Similar cardiomegaly. Electronically Signed   By: Margaretha Sheffield M.D.   On: 02/03/2021 13:11     Medications:     Current Medications:   Infusions:  cefTRIAXone (ROCEPHIN)  IV 1 g (02/03/21 1553)      Patient Profile   Roy Dyer is a 55 year old male with history of severe premature CAD, ischemic CM/chronic systolic HF, SLE s/p renal transplant, OSA on CPAP, frequent PVCs. Now presenting with CP and new onset atrial flutter.  Assessment/Plan  New onset atrial flutter: -Noted in ED today, rate 110s. Onset of current episode likely yesterday based on symptoms -Multiple AT/AF episodes since last interrogation. Exact onset not known.  -Start IV amiodarone load. If has not converted to SR by Monday, will plan for TEE/DCCV -Anticoagulation with Eliquis 5 mg BID -Repeat echo -K okay. Mag 1.7, replace. Check TSH  2. Pleuritic chest pain: -? Pericarditis given symptoms and recent vaccine. Will treat empirically with colchicine 0.6 mg BID. No NSAIDs as he is post renal transplant and Scr 1.6. -Possible opacity noted on chest x-ray.  Defer treatment for possible pneumonia to primary team. Will check procalcitonin.  3. Acute on chronic systolic heart failure/iCM - HX ICD in 2012 - Echo 12/17 EF 30%  - Echo 4/21 EF of 25 to 30% with severe LAE with mild MR and moderate TR. - Cath 04/22/20 for worsening HF symptoms. Stable CAD. - Echo 6/22 EF 30-35%, Grade III DD, severe LAE, mild/mod TR - Appears slightly volume overloaded. BNP 1441 (792 4 months ago). OptiVol elevated per device rep. Will give 80 mg lasix IV once. Mild decompensation likely d/t atrial flutter - Continue Farxiga 10 daily.  - Continue Entresto 49/51 mg bid. May need to hold if becomes hypotensive. - Continue Bystolic 5 mg daily.  - Continue Spiro 12.5 mg daily   3.  CAD with prior MI x2 and ischemic cardiomyopathy - Multiple stents to LAD, RCA and LCX - Cath 04/22/20 for worsening HF symptoms with stable disease - HS troponin 21. Trending. Does not appear to be ACS. ECG nonischemic. -Stop aspirin when starting eliquis. Continue clopidogrel. On statin and beta blocker.   4.  CKD 3a s/p renal transplant in 2012 (due to SLE): - Follows with nephrology, Dr. Clover Mealy and Dr. Lissa Merlin at [facility]. - Baseline Creatinine 1.4-1.6, 1.6 today - Continue prednisone, Prograf and CellCept.   5. Frequent PVCs - Zio 12/21 13.4% PVC burden on Zio with runs of SVT & NSVT. - PVCs seem reduced on mexilitene 300 mg bid.  - Zio (5/22): PVC 11.6 % burden - Zio (8/22): PVC 11.8% burden   Given his complex history, including renal transplant with immunocompromised state, recommend admission for close monitoring.  Will continue to follow.   Length of Stay: 0  FINCH, LINDSAY N, PA-C  02/03/2021, 4:11 PM  Advanced Heart Failure Team Pager [phone] (M-F; 7a - 5p)  Please contact [facility] for night-coverage after hours (4p -7a ) and weekends on [email or web address]   Patient seen and examined with the  above-signed Engineer, building services. I personally  reviewed laboratory data, imaging studies and relevant notes. I independently examined the patient and formulated the important aspects of the plan. I have edited the note to reflect any of my changes or salient points. I have personally discussed the plan with the patient and/or family.  55 y/o male with CAD, chronic systolic due to iCM and renal transplant.   Presents with several days of fatigue, SOB and pleuritic CP after getting flu vaccine earlier in the week.   In ER, patient found to be in new onset AFL with RVR. [place] 20.   ICD interrogation: Patient with volume accumulation and multiple episodes of atrial arrhythmias. Unclear exactly when this current episode started.   General:  Sitting up in bed. Tachypneic HEENT: normal Neck: supple. JVP to jaw Carotids 2+ bilat; no bruits. No lymphadenopathy or thryomegaly appreciated. Cor: PMI nondisplaced. Irregular tachy No rubs, gallops or murmurs. Lungs: clear Abdomen: soft, nontender, nondistended. No hepatosplenomegaly. No bruits or masses. Good bowel sounds. Extremities: no cyanosis, clubbing, rash, 1+ edema Neuro: alert & orientedx3, cranial nerves grossly intact. moves all 4 extremities w/o difficulty. Affect pleasant  Main issues is new-onset AFL with subsequent volume overload. Given symptoms also concern for possible pleuro-pericarditis.   Will start IV amio and Eliquis. One dose IV lasix. Start colchicine. Plan TEE/DC-CV Monday if he doesn't convert with amio.   Given renal transplant and complex medical history will request IM admission   [name], MD  7:10 PM

## 2021-02-03 NOTE — ED Provider Notes (Signed)
[facility] EMERGENCY DEPARTMENT Provider Note   CSN: [id number] Arrival date & time: 02/03/21  1201     History Chief Complaint  Patient presents with   Chest Pain    Roy Dyer is a 55 y.o. male.  HPI Patient presents for chest pain.  He is followed by cardiology and advanced heart failure clinic.  Medical history is notable for renal transplant in 3235, systolic heart failure with a last known EF of 25%.  He has had multiple MIs dating back to 2001.  He has had stents placed.  He was last seen by Dr. Haroldine Laws in heart failure clinic 1 month ago.  He has not had any recent medication changes.  Yesterday he felt fatigued.  Today he experienced bilateral upper chest pain radiating into his neck and the left side of his upper back.  He states that this is not typical pain for him and he has not ever felt this type of pain before.  He did get a flu shot 3 days ago.  He has not had any other preceding events.  His urine output has been normal.  He has not missed any medications, including this morning's doses.  Currently, he continues to experience these areas of discomfort.  Pain is worsened with deep inspiration.  EMS did obtain twelve-lead EKGs which did show atrial fibrillation with a maximal heart rate of 113.  Patient states that his normal heart rate is around 60.    Past Medical History:  Diagnosis Date   AICD (automatic cardioverter/defibrillator) present    Dr. Paschal Dopp follows remotely-yrly checks, Dr. Nishan,cardiology   Avascular necrosis of bone of hip ([place]) 2010 surg   Left hip arthroplasty: from chronic systemic steroids taken for his Lupus   Barrett's esophagus 2017; 2021   Blood transfusion without reported diagnosis 2010, 2012   CAD (coronary artery disease)    **DAPT long term recommended by cards**.  a. stents RCA/Circ 2001 b. BMS to LAD 07/2010  c. 01/2017: s/p DES to RCA.    Cataract    left eye   CHF (congestive heart failure) (HCC)    Chronic  headaches 02/2017   As of 01/2017, HA's no better, neurologist ordered CT.  ESR normal at that time.  HA'S COMPLETELY RESOLVED AFTER HE GOT ON CPAP 2019/2020.   Chronic renal insufficiency, stage 3 (moderate) (HCC)    GFR 55-60 (sCr 1.4-1.7 range)   Erythrocytosis    Pre and post transplant->on losartan for this   GERD (gastroesophageal reflux disease)    Hx of esoph stricture and dilatation.  +Barrett's esophagus+hx of aspiration pneumonia-->to get rpt EGD when he comes off DAPT (utd as of 01/29/18)   Gout    s/p renal transplant he was weened off of his allopurinol.   Hiatal hernia    History of adenomatous polyp of colon 09/2019   polyp x 1. Recall 5 yrs   History of end stage renal disease 04/24/2011   Secondary to SLE: HD 06/2011-10/2012 (then got renal transplant)   History of renal transplant 11/10/2012   10/2012 [facility]    Hyperlipidemia    Hypertension    implantable cardiac defibrillator single chamber    Medtronic (due to low EF)   Ischemic cardiomyopathy    Chronic systolic dysfunction--  57/3220 EF 30-35%.   Single chamber ICD 11/20/10 ([name])   Left ventricular thrombus 2012   Re-eval 02/2011 showed thrombus RESOLVED, so coumadin was d/c'd (was on it for 73mo  Lupus ([place])    OSA on CPAP 2018   Dr. Annamaria Boots 01/2017: +OSA on home sleep studay; CPAP auto titrate 5-20 started 05/2017.   Osteoporosis    Prediabetes 12/2019   A1c stable long term at 5.6-5.7 until jump up to 6.2% Aug 2021.    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   Chronic renal insufficiency, stage II (mild)    Cough with hemoptysis 07/15/2016   Hemoptysis 07/15/2016   Barrett's esophagus 04/17/2016   Immunosuppression ([place]) 04/17/2016   GERD (gastroesophageal reflux disease) 12/31/2013    History of renal transplant 09/02/2013   S/P coronary artery stent placement 12/24/2011   S/P ICD (internal cardiac defibrillator) procedure 12/24/2011   Heart failure ([place]) 12/24/2011   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[place]) 12/24/2011   Coronary artery disease with exertional angina ([place]) 09/12/2011   Ulnar neuropathy of both upper extremities 07/30/2011   Raynaud's disease 06/25/2011   Automatic implantable cardioverter-defibrillator in situ 03/07/2011   Erectile dysfunction 10/23/2010   Mixed hyperlipidemia 08/10/2009   Essential hypertension, benign 08/10/2009   ischemic cardiomyopathy status post anterolateral MI 02/01/2009   LUPUS 02/01/2009    Past Surgical History:  Procedure Laterality Date   AV FISTULA PLACEMENT  03/28/2011   Procedure: ARTERIOVENOUS (AV) FISTULA CREATION;  Surgeon: Rosetta Posner, MD;  Location: [place];  Service: Vascular;  Laterality: Left;  Creation of Left radiocephallic cimino fistula   AV FISTULA PLACEMENT  04/27/2011   Procedure: ARTERIOVENOUS (AV) FISTULA CREATION;  Surgeon: Rosetta Posner, MD;  Location: [place];  Service: Vascular;  Laterality: Left;  left basilic vein transposition   BIOPSY  09/14/2019   Procedure: BIOPSY;  Surgeon: Jerene Bears, MD;  Location: [facility];  Service: Gastroenterology;;   CARDIAC CATHETERIZATION  08/2010; 04/22/20   08/2010; 04/2020->mild/mod in-stent restenosis but none hemodynamically signif, severe LV dysfxn, frequenct PVCs-->referred to HF clinic for optimization of med mgmt   CARDIOVASCULAR STRESS TEST  07/2010; 03/2014   03/2014 showed large old infarct and EF 30-35% but no ischemia   COLONOSCOPY  09/14/2019   [place] x 1.  Recall 5 yrs   COLONOSCOPY WITH PROPOFOL N/A 09/14/2019   Procedure: COLONOSCOPY WITH PROPOFOL;  Surgeon: Jerene Bears, MD;  [facility]: [facility];  Service: Gastroenterology;  Laterality: N/A;   CORONARY PRESSURE WIRE/FFR WITH 3D MAPPING N/A 04/22/2020   Procedure: Coronary Pressure  Wire/FFR w/3D Mapping;  Surgeon: Wellington Hampshire, MD;  Location: [place] [facility];  Service: Cardiovascular;  Laterality: N/A;   DEXA  07/05/2017   Normal bone density in radius and spine.   ESOPHAGOGASTRODUODENOSCOPY  02/2009   Done by Dr. Fuller Plan for hematemesis; esophagitis found.  Repeat recommended 09/2016, at which time he will also likely get his initial screening colonoscopy.   ESOPHAGOGASTRODUODENOSCOPY  09/14/2019   esophagitis (improved compared to 2017 EGD), Barrett's.  Stomach and duodenum normal.   ESOPHAGOGASTRODUODENOSCOPY (EGD) WITH PROPOFOL N/A 09/29/2015   REPEAT EGD RECOMMENDED 09/2016.  Barrett's esoph + mild chronic gastritis.  H pylori NEG. Procedure: ESOPHAGOGASTRODUODENOSCOPY (EGD) WITH PROPOFOL;  Surgeon: Jerene Bears, MD;  [facility];  Service: Gastroenterology;  Laterality: N/A;   ESOPHAGOGASTRODUODENOSCOPY (EGD) WITH PROPOFOL N/A 09/14/2019   Procedure: ESOPHAGOGASTRODUODENOSCOPY (EGD) WITH PROPOFOL;  Surgeon: Jerene Bears, MD;  [facility];  Service: Gastroenterology;  Laterality: N/A;   ICD GENERATOR CHANGEOUT N/A 04/11/2018   Procedure: ICD GENERATOR CHANGEOUT;  Surgeon: Deboraha Sprang, MD;  Location: [place] [facility];  Service: Cardiovascular;  Laterality: N/A;   INSERT / REPLACE / REMOVE PACEMAKER     medtronic        dr [name]    [place]   icd only   KIDNEY TRANSPLANT  10/2012   [facility] nephrologist--Dr. Blair Heys   LEFT HEART CATH AND CORONARY ANGIOGRAPHY N/A 02/08/2017   PCA and DES to mRCA--needs DAPT for at least 6 mo.   Procedure: LEFT HEART CATH AND CORONARY ANGIOGRAPHY;  Surgeon: Sherren Mocha, MD;  Location: [place] [facility];  Service: Cardiovascular;  Laterality: N/A;   LEFT HEART CATH AND CORONARY ANGIOGRAPHY N/A 04/22/2020   Procedure: LEFT HEART CATH AND CORONARY ANGIOGRAPHY;  Surgeon: Wellington Hampshire, MD;  Location: [place] [facility];  Service: Cardiovascular;  Laterality: N/A;   PARTIAL HIP ARTHROPLASTY Left  2010   left   POLYPECTOMY  09/14/2019   Procedure: POLYPECTOMY;  Surgeon: Jerene Bears, MD;  [facility];  Service: Gastroenterology;;   RENAL BIOPSY     stents     05-2010 and 2- in 2010 and 1 in 2018.   TOTAL HIP ARTHROPLASTY  07/09/2011   Procedure: TOTAL HIP ARTHROPLASTY;  Surgeon: Kerin Salen, MD;  Location: [place];  Service: Orthopedics;  Laterality: Right;   TRANSTHORACIC ECHOCARDIOGRAM  10/13/2020   EF 30-35% (slight improved), grd III DD, elev PA pressure, mold/mod tricusp regurg   TYMPANOSTOMY TUBE PLACEMENT  55 yrs old   US ECHOCARDIOGRAPHY  12/2010; 02/2014;08/2014;08/2019   02/2014 EF still 25-30%, increased PA pressures.  2016 EF 30-35%, sept/inf hypokinesis, mod tricusp regurg. 08/2019 EF 25-30%, global hypok, sev dil LV w/apical aneur, grd III DD, mod tricus reg, mod pulm htn   ZIO PATCH  04/2020   HIGH PVC BURDEN.  NSVT, SVT, BBB.       Family History  Problem Relation Age of Onset   Kidney failure Mother    Lupus Mother    Stroke Mother    Diabetes Mother        type 2   Heart attack Father        X 7   Hypertension Father    Hyperlipidemia Father    Heart attack Sister 10       X 1   Hyperlipidemia Sister    Hypertension Sister    Heart disease Sister    Heart attack Paternal Grandfather    Heart disease Paternal Aunt    Heart disease Paternal Uncle    Colon cancer Neg Hx    Esophageal cancer Neg Hx    Stomach cancer Neg Hx    Rectal cancer Neg Hx     Social History   Tobacco Use   Smoking status: Former    Packs/day: 0.50    Years: 30.00    Pack years: 15.00    Types: Cigarettes    Quit date: 08/02/2010    Years since quitting: 10.5   Smokeless tobacco: Never  Vaping Use   Vaping Use: Never used  Substance Use Topics   Alcohol use: No    Alcohol/week: 0.0 standard drinks   Drug use: No    Home Medications Prior to Admission medications   Medication Sig Start Date End Date Taking? Authorizing Provider  acetaminophen (TYLENOL)  500 MG tablet Take 1,000  mg by mouth every 6 (six) hours as needed (for pain.).    [provider]  allopurinol (ZYLOPRIM) 100 MG tablet Take 1 tablet (100 mg total) by mouth daily. 03/22/16   McGowenAdrian Blackwater, MD  aspirin EC 81 MG tablet Take 81 mg by mouth daily.    [provider]  atorvastatin (LIPITOR) 80 MG tablet Take 80 mg by mouth at bedtime.    [provider]  BYSTOLIC 5 MG tablet TAKE 1 TABLET BY MOUTH DAILY 08/16/16   McGowen, [name], MD  clopidogrel (PLAVIX) 75 MG tablet TAKE 1 TABLET BY MOUTH DAILY 08/16/16   McGowen, [name], MD  dapagliflozin propanediol (FARXIGA) 10 MG TABS tablet Take 1 tablet (10 mg total) by mouth daily before breakfast. 09/20/20   McGowen, [name], MD  DEXILANT 60 MG capsule TAKE 1 CAPSULE BY MOUTH DAILY BEFORE BREAKFAST 09/25/16   Pyrtle, Lajuan Lines, MD  famotidine (PEPCID) 20 MG tablet Take 40 mg by mouth at bedtime.     [provider]  fluticasone (FLONASE) 50 MCG/ACT nasal spray Place 1 spray into both nostrils daily.    [provider]  magnesium oxide (MAG-OX) 400 MG tablet Take 400 mg by mouth daily.     [provider]  mexiletine (MEXITIL) 150 MG capsule Take 2 capsules (300 mg total) by mouth 2 (two) times daily. 09/23/20   Bensimhon, Shaune Pascal, MD  mycophenolate (CELLCEPT) 250 MG capsule Take 3 capsules (750 mg total) by mouth 2 (two) times daily. 12/23/16   McGowen, [name], MD  niacin 500 MG tablet Take 1,000 mg by mouth at bedtime.    [provider]  nitroGLYCERIN (NITROSTAT) 0.4 MG SL tablet Place 1 tablet (0.4 mg total) under the tongue every 5 (five) minutes as needed for chest pain (DO NOT EXCEED 3 DOSES). 11/23/19   Josue Hector, MD  predniSONE (DELTASONE) 5 MG tablet TAKE 1 TABLET BY MOUTH DAILY 10/24/16   McGowen, [name], MD  sacubitril-valsartan (ENTRESTO) 49-51 MG Take 1 tablet by mouth 2 (two) times daily. 09/23/20   Bensimhon, Shaune Pascal, MD  spironolactone (ALDACTONE) 25 MG tablet  Take 0.5 tablets (12.5 mg total) by mouth daily. Patient not taking: Reported on 01/31/2021 12/30/20 03/30/21  Bensimhon, Shaune Pascal, MD  tacrolimus (PROGRAF) 0.5 MG capsule Take 0.5 mg by mouth every 12 (twelve) hours.     [provider]  traMADol (ULTRAM) 50 MG tablet Take 50 mg by mouth every 6 (six) hours as needed. for pain 06/06/17   [provider]    Allergies    Triptans  Review of Systems   Review of Systems  Constitutional:  Positive for fatigue. Negative for activity change, appetite change, chills, diaphoresis and fever.  HENT:  Negative for ear pain and sore throat.   Eyes:  Negative for pain and visual disturbance.  Respiratory:  Negative for cough, chest tightness, shortness of breath and wheezing.   Cardiovascular:  Positive for chest pain. Negative for palpitations and leg swelling.  Gastrointestinal:  Positive for nausea. Negative for abdominal pain, diarrhea and vomiting.  Genitourinary:  Negative for dysuria and hematuria.  Musculoskeletal:  Positive for back pain and neck pain. Negative for arthralgias.  Skin:  Negative for color change and rash.  Neurological:  Negative for dizziness, seizures, syncope, weakness, light-headedness and numbness.  Psychiatric/Behavioral:  Negative for confusion and decreased concentration.   All other systems reviewed and are negative.  Physical Exam Updated Vital Signs BP  113/76   Pulse (!) 101   Temp 98.2 F (36.8 C) (Oral)   Resp (!) 21   Ht 5' 7"  (1.702 m)   Wt 97.5 kg   SpO2 99%   BMI 33.67 kg/m   Physical Exam Vitals and nursing note reviewed.  Constitutional:      General: He is not in acute distress.    Appearance: He is well-developed. He is not ill-appearing, toxic-appearing or diaphoretic.  HENT:     Head: Normocephalic and atraumatic.  Eyes:     Extraocular Movements: Extraocular movements intact.     Conjunctiva/sclera: Conjunctivae normal.  Cardiovascular:     Rate and Rhythm: Regular  rhythm. Tachycardia present.  Pulmonary:     Effort: Tachypnea present. No respiratory distress.     Breath sounds: Normal breath sounds. No decreased breath sounds, wheezing or rales.  Chest:     Chest wall: No tenderness.  Abdominal:     Palpations: Abdomen is soft.     Tenderness: There is no abdominal tenderness.  Musculoskeletal:     Cervical back: Normal range of motion and neck supple.     Right lower leg: No edema.     Left lower leg: No edema.  Skin:    General: Skin is warm and dry.  Neurological:     General: No focal deficit present.     Mental Status: He is alert and oriented to person, place, and time.     Cranial Nerves: No cranial nerve deficit.     Motor: No weakness.  Psychiatric:        Mood and Affect: Mood normal.        Behavior: Behavior normal.    ED Results / Procedures / Treatments   Labs (all labs ordered are listed, but only abnormal results are displayed) Labs Reviewed  CBC - Abnormal; Notable for the following components:      Result Value   WBC 12.5 (*)    HCT 53.0 (*)    All other components within normal limits  COMPREHENSIVE METABOLIC PANEL - Abnormal; Notable for the following components:   Glucose, Bld 192 (*)    Creatinine, Ser 1.61 (*)    Total Protein 5.6 (*)    GFR, Estimated 51 (*)    All other components within normal limits  BRAIN NATRIURETIC PEPTIDE - Abnormal; Notable for the following components:   B Natriuretic Peptide 1,441.7 (*)    All other components within normal limits  I-STAT CHEM 8, ED - Abnormal; Notable for the following components:   BUN 22 (*)    Creatinine, Ser 1.60 (*)    Glucose, Bld 185 (*)    Calcium, Ion 1.06 (*)    Hemoglobin 17.7 (*)    All other components within normal limits  TROPONIN I (HIGH SENSITIVITY) - Abnormal; Notable for the following components:   Troponin I (High Sensitivity) 21 (*)    All other components within normal limits  RESP PANEL BY RT-PCR (FLU A&B, COVID) ARPGX2  MAGNESIUM   LIPASE, BLOOD  URINALYSIS, ROUTINE W REFLEX MICROSCOPIC  MYCOPHENOLIC ACID (CELLCEPT)  TACROLIMUS LEVEL  TROPONIN I (HIGH SENSITIVITY)    EKG EKG Interpretation  Date/Time:  Friday February 03 2021 12:12:00 EDT Ventricular Rate:  110 PR Interval:    QRS Duration: 98 QT Interval:  336 QTC Calculation: 455 R Axis:   -77 Text Interpretation: Atrial flutter Inferior infarct, age indeterminate Abnormal lateral Q waves Probable anterior infarct, age indeterminate Confirmed by Godfrey Pick [phone])  on 02/03/2021 12:44:49 PM  Radiology DG Chest Portable 1 View  Result Date: 02/03/2021 CLINICAL DATA:  Chest pain. EXAM: PORTABLE CHEST 1 VIEW COMPARISON:  05/18/2017. FINDINGS: Similar enlarged cardiac silhouette. Similar right subclavian approach single lead cardiac rhythm maintenance device. Mild left lateral basilar opacity. No definite pleural effusions. No visible pneumothorax. No acute osseous abnormality. IMPRESSION: 1. Mild left lateral lung base opacity, which could represent atelectasis or pneumonia. Dedicated PA and lateral radiographs could better characterize if clinically indicated. 2. Similar cardiomegaly. Electronically Signed   By: Margaretha Sheffield M.D.   On: 02/03/2021 13:11    Procedures Procedures   Medications Ordered in ED Medications  cefTRIAXone (ROCEPHIN) 1 g in sodium chloride 0.9 % 100 mL IVPB (1 g Intravenous New Bag/Given 02/03/21 1553)  doxycycline (VIBRA-TABS) tablet 100 mg (100 mg Oral Given 02/03/21 1553)    ED Course  I have reviewed the triage vital signs and the nursing notes.  Pertinent labs & imaging results that were available during my care of the patient were reviewed by me and considered in my medical decision making (see chart for details).    MDM Rules/Calculators/A&P                         CRITICAL CARE Performed by: Godfrey Pick   Total critical care time: 35 minutes  Critical care time was exclusive of separately billable procedures and  treating other patients.  Critical care was necessary to treat or prevent imminent or life-threatening deterioration.  Critical care was time spent personally by me on the following activities: development of treatment plan with patient and/or surrogate as well as nursing, discussions with consultants, evaluation of patient's response to treatment, examination of patient, obtaining history from patient or surrogate, ordering and performing treatments and interventions, ordering and review of laboratory studies, ordering and review of radiographic studies, pulse oximetry and re-evaluation of patient's condition.   Patient is a 55 year old male with history of MIs, renal transplant, and heart failure, presenting for onset of chest pain radiating into his neck and back, and shortness of breath.  On arrival, he is tachycardic and tachypneic.  Breathing is slightly labored.  He is able to speak in full sentences.  SPO2 is normal on room air.  His blood pressure is normal.  EKG was obtained which showed findings consistent with atrial flutter with 3:1 conduction.  Per chart review, does not appear that he has had A. fib/flutter in the past.  He is on mexiletine.  He does take aspirin and Plavix but is not on anticoagulation.  Laboratory work-up initiated to assess for evidence of electrolyte abnormalities and/or ischemia.  Initial opponent only slightly elevated.  BNP is elevated greater than baseline.  Electrolytes are in a normal range.  Patient's creatinine is at baseline.  Chest x-ray showed a left lateral lung base opacity that could represent a pneumonia.  Given his symptoms of fatigue yesterday and his shortness of breath and pleuritic chest pain today, in addition to a mild leukocytosis and his new onset A. flutter which could have been precipitated by infection, empiric treatment for CAP was given.  Given his complex cardiac history with a new onset of atrial flutter, patient to be admitted for medication  optimization.  On reassessments, he had persistent symptoms of mild apical chest pains that did not worsen.  His blood pressure remained normal.  Heart rate remained in the range of 100-110.  Cardiology was consulted for recommendations and  admission.  Care of patient signed out to oncoming ED provider.  Final Clinical Impression(s) / ED Diagnoses Final diagnoses:  Chest pain, unspecified type  Atrial flutter, unspecified type [facility])    Rx / DC Orders ED Discharge Orders     None        Godfrey Pick, MD 02/03/21 [phone]

## 2021-02-04 ENCOUNTER — Inpatient Hospital Stay (HOSPITAL_COMMUNITY): Payer: Managed Care, Other (non HMO)

## 2021-02-04 ENCOUNTER — Encounter: Payer: Self-pay | Admitting: Internal Medicine

## 2021-02-04 DIAGNOSIS — I5043 Acute on chronic combined systolic (congestive) and diastolic (congestive) heart failure: Secondary | ICD-10-CM

## 2021-02-04 DIAGNOSIS — I4892 Unspecified atrial flutter: Secondary | ICD-10-CM | POA: Diagnosis not present

## 2021-02-04 DIAGNOSIS — I483 Typical atrial flutter: Secondary | ICD-10-CM | POA: Diagnosis not present

## 2021-02-04 DIAGNOSIS — I5023 Acute on chronic systolic (congestive) heart failure: Secondary | ICD-10-CM | POA: Diagnosis not present

## 2021-02-04 DIAGNOSIS — I5020 Unspecified systolic (congestive) heart failure: Secondary | ICD-10-CM | POA: Diagnosis not present

## 2021-02-04 DIAGNOSIS — R079 Chest pain, unspecified: Secondary | ICD-10-CM | POA: Diagnosis not present

## 2021-02-04 LAB — PROCALCITONIN: Procalcitonin: 0.31 ng/mL

## 2021-02-04 LAB — BASIC METABOLIC PANEL
Anion gap: 13 (ref 5–15)
BUN: 22 mg/dL — ABNORMAL HIGH (ref 6–20)
CO2: 25 mmol/L (ref 22–32)
Calcium: 9.4 mg/dL (ref 8.9–10.3)
Chloride: 99 mmol/L (ref 98–111)
Creatinine, Ser: 1.86 mg/dL — ABNORMAL HIGH (ref 0.61–1.24)
GFR, Estimated: 43 mL/min — ABNORMAL LOW (ref >60)
Glucose, Bld: 135 mg/dL — ABNORMAL HIGH (ref 70–99)
Potassium: 4.7 mmol/L (ref 3.5–5.1)
Sodium: 137 mmol/L (ref 135–145)

## 2021-02-04 LAB — CBC
HCT: 52.1 % — ABNORMAL HIGH (ref 39.0–52.0)
Hemoglobin: 17.2 g/dL — ABNORMAL HIGH (ref 13.0–17.0)
MCH: 29.5 pg (ref 26.0–34.0)
MCHC: 33 g/dL (ref 30.0–36.0)
MCV: 89.4 fL (ref 80.0–100.0)
Platelets: 171 10*3/uL (ref 150–400)
RBC: 5.83 MIL/uL — ABNORMAL HIGH (ref 4.22–5.81)
RDW: 13.9 % (ref 11.5–15.5)
WBC: 14.2 10*3/uL — ABNORMAL HIGH (ref 4.0–10.5)
nRBC: 0 % (ref 0.0–0.2)

## 2021-02-04 LAB — HIV ANTIBODY (ROUTINE TESTING W REFLEX): HIV Screen 4th Generation wRfx: NONREACTIVE

## 2021-02-04 LAB — ECHOCARDIOGRAM COMPLETE
Area-P 1/2: 5.97 cm2
Height: 67 in
S' Lateral: 5.6 cm
Single Plane A4C EF: 38 %
Weight: 3440 oz

## 2021-02-04 LAB — HEMOGLOBIN A1C
Hgb A1c MFr Bld: 6.1 % — ABNORMAL HIGH (ref 4.8–5.6)
Mean Plasma Glucose: 128.37 mg/dL

## 2021-02-04 LAB — TSH: TSH: 1.942 u[IU]/mL (ref 0.350–4.500)

## 2021-02-04 LAB — MAGNESIUM: Magnesium: 2.2 mg/dL (ref 1.7–2.4)

## 2021-02-04 MED ORDER — COLCHICINE 0.6 MG PO TABS
0.6000 mg | ORAL_TABLET | Freq: Every day | ORAL | Status: DC
  Administered 2021-02-04 – 2021-02-08 (×5): 0.6 mg via ORAL
  Filled 2021-02-04 (×5): qty 1

## 2021-02-04 MED ORDER — OXYCODONE HCL 5 MG PO TABS
5.0000 mg | ORAL_TABLET | Freq: Once | ORAL | Status: AC
  Administered 2021-02-04: 5 mg via ORAL
  Filled 2021-02-04: qty 1

## 2021-02-04 MED ORDER — MAGNESIUM SULFATE IN D5W 1-5 GM/100ML-% IV SOLN
1.0000 g | Freq: Once | INTRAVENOUS | Status: AC
  Administered 2021-02-04: 1 g via INTRAVENOUS
  Filled 2021-02-04: qty 100

## 2021-02-04 MED ORDER — ACETAMINOPHEN 325 MG PO TABS: 650.0000 mg | ORAL_TABLET | Freq: Three times a day (TID) | ORAL | Status: DC

## 2021-02-04 MED ORDER — TRAMADOL HCL 50 MG PO TABS
100.0000 mg | ORAL_TABLET | Freq: Three times a day (TID) | ORAL | Status: DC
  Filled 2021-02-04: qty 2

## 2021-02-04 MED ORDER — TRAMADOL HCL 50 MG PO TABS
50.0000 mg | ORAL_TABLET | Freq: Three times a day (TID) | ORAL | Status: AC | PRN
  Administered 2021-02-04: 50 mg via ORAL
  Filled 2021-02-04: qty 1

## 2021-02-04 MED ORDER — COLCHICINE 0.6 MG PO TABS: 0.6000 mg | ORAL_TABLET | Freq: Every day | ORAL | Status: DC

## 2021-02-04 MED ORDER — TRAMADOL HCL 50 MG PO TABS
50.0000 mg | ORAL_TABLET | Freq: Three times a day (TID) | ORAL | Status: AC | PRN
  Administered 2021-02-05: 50 mg via ORAL
  Filled 2021-02-04: qty 1

## 2021-02-04 MED ORDER — ACETAMINOPHEN 650 MG RE SUPP: 650.0000 mg | Freq: Three times a day (TID) | RECTAL | Status: AC

## 2021-02-04 MED ORDER — ACETAMINOPHEN 650 MG RE SUPP: 650.0000 mg | Freq: Three times a day (TID) | RECTAL | Status: DC

## 2021-02-04 MED ORDER — ACETAMINOPHEN 325 MG PO TABS
650.0000 mg | ORAL_TABLET | Freq: Three times a day (TID) | ORAL | Status: AC
  Administered 2021-02-04 – 2021-02-05 (×2): 650 mg via ORAL
  Filled 2021-02-04 (×2): qty 2

## 2021-02-04 MED ORDER — MELATONIN 3 MG PO TABS
3.0000 mg | ORAL_TABLET | Freq: Every evening | ORAL | Status: DC | PRN
  Administered 2021-02-04 – 2021-02-07 (×2): 3 mg via ORAL
  Filled 2021-02-04 (×3): qty 1

## 2021-02-04 NOTE — Plan of Care (Signed)

## 2021-02-04 NOTE — Progress Notes (Signed)
   02/04/21 0309  Assess: MEWS Score  Temp 98.1 F (36.7 C)  BP 117/89  Pulse Rate (!) 142  Resp 18  Level of Consciousness Alert  O2 Device Room Air  Assess: MEWS Score  MEWS Temp 0  MEWS Systolic 0  MEWS Pulse 3  MEWS RR 0  MEWS LOC 0  MEWS Score 3  MEWS Score Color Yellow  Assess: if the MEWS score is Yellow or Red  Were vital signs taken at a resting state? Yes  Focused Assessment No change from prior assessment  Early Detection of Sepsis Score *See Row Information* High  MEWS guidelines implemented *See Row Information* Yes  Treat  MEWS Interventions Escalated (See documentation below)  Pain Scale 0-10  Pain Score 3  Pain Type Acute pain  Pain Location Chest  Pain Orientation Mid  Pain Descriptors / Indicators Aching  Patients Stated Pain Goal 0  Pain Intervention(s) Medication (See eMAR)  Take Vital Signs  Increase Vital Sign Frequency  Yellow: Q 2hr X 2 then Q 4hr X 2, if remains yellow, continue Q 4hrs  Escalate  MEWS: Escalate Yellow: discuss with charge nurse/RN and consider discussing with provider and RRT  Notify: Charge Nurse/RN  Name of Charge Nurse/RN Notified Heather, RN  Date Charge Nurse/RN Notified 02/04/21  Time Charge Nurse/RN Notified 0310  Document  Patient Outcome Other (Comment) (Pt is here and on IV amiodarone gtt)  Progress note created (see row info) Yes

## 2021-02-04 NOTE — Progress Notes (Signed)
Advanced Heart Failure Rounding Note   Subjective:    He remains on IV amio. Converted to NSR this am.  Diuresed well. Denies SOB, orthopnea or PND.   Continue to c/o severe pleuritic CP. Unable to sleep   No bleeding with Eliquis  Objective:   Weight Range:  Vital Signs:   Temp:  [97.5 F (36.4 C)-98.1 F (36.7 C)] 97.5 F (36.4 C) (09/24 0706) Pulse Rate:  [49-142] 74 (09/24 1106) Resp:  [11-32] 17 (09/24 1106) BP: (108-126)/(69-92) 116/80 (09/24 1106) SpO2:  [94 %-99 %] 97 % (09/24 1106)    Weight change: Filed Weights   02/03/21 1216  Weight: 97.5 kg    Intake/Output:   Intake/Output Summary (Last 24 hours) at 02/04/2021 1321 Last data filed at 02/03/2021 1928 Gross per 24 hour  Intake 1673 ml  Output 1300 ml  Net 373 ml     Physical Exam: General:  Fatigued appearing. No resp difficulty HEENT: normal Neck: supple. JVP flat Carotids 2+ bilat; no bruits. No lymphadenopathy or thryomegaly appreciated. Cor: PMI nondisplaced. Regular rate & rhythm. No rubs, gallops or murmurs. Lungs: clear Abdomen: soft, nontender, nondistended. No hepatosplenomegaly. No bruits or masses. Good bowel sounds. Extremities: no cyanosis, clubbing, rash, edema Neuro: alert & orientedx3, cranial nerves grossly intact. moves all 4 extremities w/o difficulty. Affect pleasant  Telemetry: AFL -> NSR 70s Personally reviewed   Labs: Basic Metabolic Panel: Recent Labs  Lab 02/03/21 1315 02/03/21 1328 02/04/21 0543 02/04/21 0640  NA 138 137 137  --   K 4.8 4.9 4.7  --   CL 102 104 99  --   CO2  --  26 25  --   GLUCOSE 185* 192* 135*  --   BUN 22* 20 22*  --   CREATININE 1.60* 1.61* 1.86*  --   CALCIUM  --  9.1 9.4  --   MG  --  1.7  --  2.2    Liver Function Tests: Recent Labs  Lab 02/03/21 1328  AST 17  ALT 14  ALKPHOS 92  BILITOT 0.7  PROT 5.6*  ALBUMIN 3.5   Recent Labs  Lab 02/03/21 1328  LIPASE 28   No results for input(s): AMMONIA in the last 168  hours.  CBC: Recent Labs  Lab 02/03/21 1315 02/03/21 1328 02/04/21 0640  WBC  --  12.5* 14.2*  HGB 17.7* 16.9 17.2*  HCT 52.0 53.0* 52.1*  MCV  --  92.7 89.4  PLT  --  170 171    Cardiac Enzymes: No results for input(s): CKTOTAL, CKMB, CKMBINDEX, TROPONINI in the last 168 hours.  BNP: BNP (last 3 results) Recent Labs    05/19/20 0958 09/07/20 1047 02/03/21 1329  BNP 1,456.0* 792.8* 1,441.7*    ProBNP (last 3 results) No results for input(s): PROBNP in the last 8760 hours.    Other results:  Imaging: DG Chest 2 View  Result Date: 02/04/2021 CLINICAL DATA:  Chest discomfort EXAM: CHEST - 2 VIEW COMPARISON:  02/03/2021 FINDINGS: Right chest wall ICD noted with lead in the right ventricle. Mild cardiac enlargement, stable. No pleural effusion or edema. No airspace densities. IMPRESSION: No active cardiopulmonary abnormalities. Electronically Signed   By: Kerby Moors M.D.   On: 02/04/2021 09:03   DG Chest Portable 1 View  Result Date: 02/03/2021 CLINICAL DATA:  Chest pain. EXAM: PORTABLE CHEST 1 VIEW COMPARISON:  05/18/2017. FINDINGS: Similar enlarged cardiac silhouette. Similar right subclavian approach single lead cardiac rhythm maintenance device. Mild left  lateral basilar opacity. No definite pleural effusions. No visible pneumothorax. No acute osseous abnormality. IMPRESSION: 1. Mild left lateral lung base opacity, which could represent atelectasis or pneumonia. Dedicated PA and lateral radiographs could better characterize if clinically indicated. 2. Similar cardiomegaly. Electronically Signed   By: Margaretha Sheffield M.D.   On: 02/03/2021 13:11     Medications:     Scheduled Medications:  acetaminophen  650 mg Oral Q8H   Or   acetaminophen  650 mg Rectal Q8H   apixaban  5 mg Oral BID   atorvastatin  80 mg Oral QHS   clopidogrel  75 mg Oral Daily   colchicine  0.6 mg Oral Daily   dapagliflozin propanediol  10 mg Oral QAC breakfast   famotidine  40 mg Oral  QHS   mexiletine  300 mg Oral BID   mycophenolate  750 mg Oral BID   nebivolol  5 mg Oral Daily   predniSONE  5 mg Oral Daily   sacubitril-valsartan  1 tablet Oral BID   spironolactone  12.5 mg Oral Daily   tacrolimus  0.5 mg Oral Q12H    Infusions:  sodium chloride Stopped (02/03/21 2329)   amiodarone 30 mg/hr (02/04/21 0046)    PRN Medications:    Assessment&Plan:   1. New onset atrial flutter: -Multiple AT/AF episodes since last interrogation. Exact onset not known.  -Now converted on IV amio. Will continue amio load -Continue Eliquis 5 mg BID -Repeat echo   2. Pleuritic chest pain: -? Pericarditis given symptoms and recent vaccine. Will treat empirically with colchicine 0.6 mg BID. No NSAIDs as he is post renal transplant and Scr 1.6. -Pain remains severe. Will check VQ for completeness sake - Continue colchicine.  - Add tramadol - Await echo   3. Acute on chronic systolic heart failure/iCM - HX ICD in 2012 - Echo 12/17 EF 30%  - Echo 4/21 EF of 25 to 30% with severe LAE with mild MR and moderate TR. - Cath 04/22/20 for worsening HF symptoms. Stable CAD. - Echo 6/22 EF 30-35%, Grade III DD, severe LAE, mild/mod TR - Volume status much improved with 1 dose IV lasix. Scr bumped slightly. Hold lasix - Continue Farxiga 10 daily.  - Continue Entresto 49/51 mg bid.  - Continue Bystolic 5 mg daily.  - Continue Spiro 12.5 mg daily   3.  CAD with prior MI x2 and ischemic cardiomyopathy - Multiple stents to LAD, RCA and LCX - Cath 04/22/20 for worsening HF symptoms with stable disease - HS troponin 21. Trending. Does not appear to be ACS. ECG nonischemic. -Stop aspirin when starting eliquis. Continue clopidogrel. On statin and beta blocker. - No change   4.  AKI on CKD 3a s/p renal transplant in 2012 (due to SLE): - Follows with nephrology, Dr. Clover Mealy and Dr. Lissa Merlin at [facility]. - Baseline Creatinine 1.4-1.6, 1.8 today hold diuretics today - Continue prednisone,  Prograf and CellCept.   5. Frequent PVCs - Zio 12/21 13.4% PVC burden on Zio with runs of SVT & NSVT. - PVCs reduced on mexilitene 300 mg bid.  - Zio (5/22): PVC 11.6 % burden - Zio (8/22): PVC 11.8% burden   Length of Stay: 1   [name] MD 02/04/2021, 1:21 PM  Advanced Heart Failure Team Pager [phone] (M-F; 7a - 4p)  Please contact [facility] for night-coverage after hours (4p -7a ) and weekends on [email or web address]

## 2021-02-04 NOTE — Assessment & Plan Note (Signed)
Continues cardiology f/u Plan- flu vax

## 2021-02-04 NOTE — Progress Notes (Signed)
Pt refused CPAP tonight.

## 2021-02-04 NOTE — Progress Notes (Signed)
  Echocardiogram 2D Echocardiogram has been performed.  Roy Dyer 02/04/2021, 3:08 PM

## 2021-02-04 NOTE — Progress Notes (Signed)
Family Medicine Teaching Service Daily Progress Note Intern Pager: [phone]  Patient name: Roy Dyer Medical record number: [id number] Date of birth: 1965-12-01 Age: 55 y.o. Gender: male  Primary Care Provider: [name], MD Consultants: Cardiology Code Status: Full  Pt Overview and Major Events to Date:  Pt admitted - 02/03/21  Assessment and Plan:  Roy Dyer is a 55 y.o. male presenting with chest pain and nausea. PMH is significant for severe premature CAD, ischemic CM/chronic systolic HF EF 60-73%, frequent PVCs, s/p AICD, SLE s/p renal transplant 2012, OSA on CPAP, HLD, HTN, prediabetes.   Chest Pain  HFrEF EF 30-35%  Pericarditis  Pneumonia Patient presents after event of chest pain radiating into neck and left side of upper back, pleuritic in nature.  Patient has extensive history of multiple MIs dating back to 2000. Patient also has HFrEF with EF of 30 to 35%. Patient afebrile overnight. Procalcitonin was normal, less concerning for a PNA. WBC elevated, likely 2/2 prednisone. Patient had abdominal fluid wave, with pericardial knock, no edema on extremities.  -Cardiology consulted, appreciate recs, ask cardiology about performing TTE before TEE -Continue colchicine 0.6 mg daily, avoid NSAIDs since patient's status post transplant with serum creatinine of 1.6 -1 x dose of oxycodone 5 mg -Continue Farxiga 10 mg daily, spironolactone 71.0 mg daily, Bystolic 5 mg daily, Entresto 49/51 twice daily -TEE echocardiogram 9/26 -Procalcitonin 0.31 -WBC 14.2 (12.5)   AKI Patient Cr 1.86, up from 1.61 yesterday. Unsure etiology of Cr rise, may be 2/2 colchicine use. -Avoid nephrotoxic medication -Consult cardiology about adjusting colchicine dose to 1x daily  Atrial Flutter  Frequent PVCs w/ AICD -F/u w/ Cardiology about recommendations  -Continuous cardiac monitoring -mexiletine 200 mg BID -DCCV if not converted by Monday -Continue Eliquis 5 mg BID -Echocardiogram  9/26 -TSH 1.942 - normal -Mg 2.2 - stable  Lupus  S/p Renal Transplant  -Continue tacrolimus 0.5 mg -Continue mycophenolate to 50 mg -Continue prednisone 5 mg  HLD -Continue Lipitor 80 mg -Hold niacin 500 mg  HTN BP today 112/80, Home meds include Entresto 49/51 -Continue home meds  Hx of Prediabetes  significantly elevated glucose Glucose 135 today. Hgb A1c 6.1, patient still prediabetic  GERD Continue Pepcid   OSA -Continue CPAP qhs  FEN/GI: Heart Healthy Diet PPx: Eliquis Dispo: Pending further workup    Subjective:  Patient was unable to sleep because of chest pain/discomfort and nausea. Updated patient on the clinical course.  Objective: Temp:  [98.1 F (36.7 C)-98.2 F (36.8 C)] 98.1 F (36.7 C) (09/24 0500) Pulse Rate:  [49-141] 112 (09/24 0500) Resp:  [8-32] 16 (09/24 0500) BP: (108-128)/(69-92) 112/80 (09/24 0500) SpO2:  [94 %-99 %] 97 % (09/24 0500) Weight:  [97.5 kg] 97.5 kg (09/23 1216) Physical Exam: General: White male, obese, no acute distress, mild discomfort Cardiovascular: Peri-cardial knock, nrmg Respiratory: CTABL, no wheezes or stridor Abdomen: Soft, non-distended, shifting fluid wave, non-tender to palpation Extremities: No edema, moving all extremities independently  Laboratory: Recent Labs  Lab 02/03/21 1315 02/03/21 1328  WBC  --  12.5*  HGB 17.7* 16.9  HCT 52.0 53.0*  PLT  --  170   Recent Labs  Lab 02/03/21 1315 02/03/21 1328  NA 138 137  K 4.8 4.9  CL 102 104  CO2  --  26  BUN 22* 20  CREATININE 1.60* 1.61*  CALCIUM  --  9.1  PROT  --  5.6*  BILITOT  --  0.7  ALKPHOS  --  92  ALT  --  14  AST  --  17  GLUCOSE 185* 192*      Imaging/Diagnostic Tests:  CXR 2-view: IMPRESSION: No active cardiopulmonary abnormalities.  Holley Bouche, MD 02/04/2021, 6:10 AM PGY-1, [place] Intern pager: [phone], text pages welcome

## 2021-02-04 NOTE — Progress Notes (Addendum)
Dr. Haroldine Laws ordering Tramadol for pain when rounding. Patient requested tramadol and I was unable to give patient the Tramadol, as it was discontinued. Patient questioned why it was not available. I told him I would ask why. Tylenol was ordered but patient did not want to take that as the tylenol and percocet did not decrease pain when given earlier. Attempted to page family practice using amion but unsuccessful. Contacted family practice provider on pager and reviewed above information.  Informed that the above will be discussed with team. Order received for 50 mg tramadol and patient stated the medication was decreasing pain after 40 minutes. Report to Burnard Bunting RN.

## 2021-02-04 NOTE — Assessment & Plan Note (Signed)
Benefits with good compliance and control. He is going to continue without change. Plan- continue auto 5-12

## 2021-02-04 NOTE — ED Notes (Signed)
Patient is pleasant. Can ambulate on own. Alert and oriented. 2nd round of Amiodarone is infusing. Arrhythmia found today. To have procedure done in the morning to look at heart.

## 2021-02-05 ENCOUNTER — Inpatient Hospital Stay (HOSPITAL_COMMUNITY): Payer: Managed Care, Other (non HMO)

## 2021-02-05 DIAGNOSIS — R071 Chest pain on breathing: Secondary | ICD-10-CM

## 2021-02-05 DIAGNOSIS — D849 Immunodeficiency, unspecified: Secondary | ICD-10-CM

## 2021-02-05 DIAGNOSIS — Z940 Kidney transplant status: Secondary | ICD-10-CM | POA: Diagnosis not present

## 2021-02-05 DIAGNOSIS — I483 Typical atrial flutter: Secondary | ICD-10-CM

## 2021-02-05 DIAGNOSIS — R0789 Other chest pain: Secondary | ICD-10-CM

## 2021-02-05 DIAGNOSIS — Z95810 Presence of automatic (implantable) cardiac defibrillator: Secondary | ICD-10-CM

## 2021-02-05 DIAGNOSIS — M3212 Pericarditis in systemic lupus erythematosus: Principal | ICD-10-CM

## 2021-02-05 DIAGNOSIS — N170 Acute kidney failure with tubular necrosis: Secondary | ICD-10-CM

## 2021-02-05 LAB — BASIC METABOLIC PANEL
Anion gap: 11 (ref 5–15)
BUN: 24 mg/dL — ABNORMAL HIGH (ref 6–20)
CO2: 26 mmol/L (ref 22–32)
Calcium: 9 mg/dL (ref 8.9–10.3)
Chloride: 99 mmol/L (ref 98–111)
Creatinine, Ser: 1.84 mg/dL — ABNORMAL HIGH (ref 0.61–1.24)
GFR, Estimated: 43 mL/min — ABNORMAL LOW (ref >60)
Glucose, Bld: 115 mg/dL — ABNORMAL HIGH (ref 70–99)
Potassium: 4.4 mmol/L (ref 3.5–5.1)
Sodium: 136 mmol/L (ref 135–145)

## 2021-02-05 LAB — CBC
HCT: 49.4 % (ref 39.0–52.0)
Hemoglobin: 15.7 g/dL (ref 13.0–17.0)
MCH: 28.6 pg (ref 26.0–34.0)
MCHC: 31.8 g/dL (ref 30.0–36.0)
MCV: 90 fL (ref 80.0–100.0)
Platelets: 171 10*3/uL (ref 150–400)
RBC: 5.49 MIL/uL (ref 4.22–5.81)
RDW: 13.9 % (ref 11.5–15.5)
WBC: 14.9 10*3/uL — ABNORMAL HIGH (ref 4.0–10.5)
nRBC: 0 % (ref 0.0–0.2)

## 2021-02-05 LAB — PROCALCITONIN: Procalcitonin: 0.39 ng/mL

## 2021-02-05 MED ORDER — SACUBITRIL-VALSARTAN 24-26 MG PO TABS
1.0000 | ORAL_TABLET | Freq: Two times a day (BID) | ORAL | Status: DC
  Administered 2021-02-05: 1 via ORAL
  Filled 2021-02-05 (×2): qty 1

## 2021-02-05 MED ORDER — TECHNETIUM TO 99M ALBUMIN AGGREGATED
3.7500 | Freq: Once | INTRAVENOUS | Status: AC | PRN
  Administered 2021-02-05: 3.75 via INTRAVENOUS

## 2021-02-05 MED ORDER — MELATONIN 3 MG PO TABS
3.0000 mg | ORAL_TABLET | Freq: Once | ORAL | Status: AC
  Administered 2021-02-05: 3 mg via ORAL
  Filled 2021-02-05: qty 1

## 2021-02-05 MED ORDER — AMIODARONE HCL 200 MG PO TABS
200.0000 mg | ORAL_TABLET | Freq: Two times a day (BID) | ORAL | Status: DC
  Administered 2021-02-05 – 2021-02-08 (×7): 200 mg via ORAL
  Filled 2021-02-05 (×7): qty 1

## 2021-02-05 MED ORDER — TRAMADOL HCL 50 MG PO TABS
100.0000 mg | ORAL_TABLET | Freq: Two times a day (BID) | ORAL | Status: DC
  Administered 2021-02-05: 100 mg via ORAL
  Filled 2021-02-05 (×3): qty 2

## 2021-02-05 NOTE — Progress Notes (Signed)
Family Medicine Teaching Service Daily Progress Note Intern Pager: [phone]  Patient name: Roy Dyer Medical record number: [id number] Date of birth: 1966-02-20 Age: 55 y.o. Gender: male  Primary Care Provider: [name], MD Consultants: Cardiology Code Status: Full  Pt Overview and Major Events to Date:  Pt admitted - 02/03/21  Assessment and Plan: Roy Dyer is a 55 y.o. male presenting with chest pain and nausea. PMH is significant for severe premature CAD, ischemic CM/chronic systolic HF EF 06-30%, frequent PVCs, s/p AICD, SLE s/p renal transplant 2012, OSA on CPAP, HLD, HTN, prediabetes.     Chest Pain  HFrEF EF 30-35%  Pericarditis  Patient received tramadol 50 overnight, but was scheduled for tramadol 100 mg, per HF team. Will determine dosing. Echo demonstrated LV EF 30-35% w/ grade 2 diastolic dysfunction. LA & RA severely dilated. Spironolactone not given yesterday per high K. 4.4 today, (4.7, 4.9), held today for soft BP.  -Hold spiro today  -Continue Colchicine 0.6 mg daily -Continue Tramadol 100 mg BID per cards note, may consider IV breakthrough regimen -F/u on V/Q scan, schedule melatonin 30 min before . -Echo rslt: LV EF 30-35%, w/ grade 2 diastolic dysfunction,  -F/u V/Q scan -Continue Farxiga 10 mg daily -Continue Entresto 49/50 1 mg twice daily -Continue Bystolic 5 mg daily -Hold spironolactone 12.5 mg daily  AKI Creatinine 1.84 today, (1.86 yesterday) baseline 1.4-1.6 Hold diuretics and nephrotoxic meds -Continue colchicine x 1 daily    Atrial Flutter  Frequent PVCs w/ AICD Multiple AT/AF episodes since last interrogation, exact onset unknown. Now converted on IV amnio.  We will follow-up cards recs -Continue Eliquis 5 mg twice daily -Continue clopidogrel 75 -Switch to oral amiodarone -Follow-up echo   Lupus  S/p Renal Transplant  -Continue tacrolimus 0.5 mg -Continue mycophenolate 50 mg -Continue prednisone 5 mg  HLD -Continue  Lipitor 80 mg -Hold niacin 500 mg for arrhythmia risk  HTN BP 100/61 Home meds include Entresto 49/51, Entresto decreased to 24/26 for concern with BP and kidney. -Start Entresto 24/26   Hx of Prediabetes  significantly elevated glucose Glucose 115 today, stable  GERD -Continue Pepcid  OSA -Continue CPAP qhs  FEN/GI: Heart Healthy Diet PPx: Eliquis Dispo: Pending further workup     .   Subjective:  Patient still has complaints of chest pain/discomfort. Tramadol seemed to help some yesterday, but wasn't enough. Patient appreciated melatonin and ask that it be scheduled, and given before next procedure/scan  Objective: Temp:  [97.5 F (36.4 C)-98.7 F (37.1 C)] 97.8 F (36.6 C) (09/25 0254) Pulse Rate:  [59-131] 59 (09/24 2354) Resp:  [16-18] 18 (09/25 0254) BP: (93-120)/(60-86) 100/61 (09/25 0254) SpO2:  [95 %-97 %] 96 % (09/25 0254) Physical Exam: General: White male, with chest discomfort, no signs of distress.  Cardiovascular: friction rub appreciated on exam, no murmurs or gallops Respiratory: CTABL in upper lung fields. Diffuse crackles in lower long fields Abdomen: Soft, NT/ND, fluid wave appreciated on exam Extremities: Pulses intact, no edema, moving all independently  Laboratory: Recent Labs  Lab 02/03/21 1328 02/04/21 0640 02/05/21 0204  WBC 12.5* 14.2* 14.9*  HGB 16.9 17.2* 15.7  HCT 53.0* 52.1* 49.4  PLT 170 171 171   Recent Labs  Lab 02/03/21 1328 02/04/21 0543 02/05/21 0204  NA 137 137 136  K 4.9 4.7 4.4  CL 104 99 99  CO2 26 25 26   BUN 20 22* 24*  CREATININE 1.61* 1.86* 1.84*  CALCIUM 9.1 9.4 9.0  PROT  5.6*  --   --   BILITOT 0.7  --   --   ALKPHOS 92  --   --   ALT 14  --   --   AST 17  --   --   GLUCOSE 192* 135* 115*      Imaging/Diagnostic Tests: Echo: LV EF 30-35% w/ grade 2 diastolic dysfunction. LA & RA severely dilated.  Roy Bouche, MD 02/05/2021, 6:46 AM PGY-1, [place] Intern pager:  [phone], text pages welcome

## 2021-02-05 NOTE — Progress Notes (Signed)
Advanced Heart Failure Rounding Note   Subjective:    He remains on IV amio. Remains in NSR.   Still with pleuritic CP. Mildly improved. Off diuretics Creatinine stable  VQ performed. Awaiting results.   Echo 02/04/21: EF 30-35% No effusion   Objective:   Weight Range:  Vital Signs:   Temp:  [97.6 F (36.4 C)-98.7 F (37.1 C)] 97.7 F (36.5 C) (09/25 0807) Pulse Rate:  [58-74] 68 (09/25 0920) Resp:  [17-18] 18 (09/25 0807) BP: (93-120)/(48-80) 99/62 (09/25 0920) SpO2:  [91 %-97 %] 93 % (09/25 0920)    Weight change: Filed Weights   02/03/21 1216  Weight: 97.5 kg    Intake/Output:  No intake or output data in the 24 hours ending 02/05/21 1000    Physical Exam: General:  Sitting up in bed No resp difficulty HEENT: normal Neck: supple. no JVD. Carotids 2+ bilat; no bruits. No lymphadenopathy or thryomegaly appreciated. Cor: PMI nondisplaced. Regular rate & rhythm. No rubs, gallops or murmurs. Lungs: clear Abdomen: soft, nontender, nondistended. No hepatosplenomegaly. No bruits or masses. Good bowel sounds. Extremities: no cyanosis, clubbing, rash, edema Neuro: alert & orientedx3, cranial nerves grossly intact. moves all 4 extremities w/o difficulty. Affect pleasant    Telemetry: NSR 60-70s Personally reviewed   Labs: Basic Metabolic Panel: Recent Labs  Lab 02/03/21 1315 02/03/21 1328 02/04/21 0543 02/04/21 0640 02/05/21 0204  NA 138 137 137  --  136  K 4.8 4.9 4.7  --  4.4  CL 102 104 99  --  99  CO2  --  26 25  --  26  GLUCOSE 185* 192* 135*  --  115*  BUN 22* 20 22*  --  24*  CREATININE 1.60* 1.61* 1.86*  --  1.84*  CALCIUM  --  9.1 9.4  --  9.0  MG  --  1.7  --  2.2  --      Liver Function Tests: Recent Labs  Lab 02/03/21 1328  AST 17  ALT 14  ALKPHOS 92  BILITOT 0.7  PROT 5.6*  ALBUMIN 3.5    Recent Labs  Lab 02/03/21 1328  LIPASE 28    No results for input(s): AMMONIA in the last 168 hours.  CBC: Recent Labs  Lab  02/03/21 1315 02/03/21 1328 02/04/21 0640 02/05/21 0204  WBC  --  12.5* 14.2* 14.9*  HGB 17.7* 16.9 17.2* 15.7  HCT 52.0 53.0* 52.1* 49.4  MCV  --  92.7 89.4 90.0  PLT  --  170 171 171     Cardiac Enzymes: No results for input(s): CKTOTAL, CKMB, CKMBINDEX, TROPONINI in the last 168 hours.  BNP: BNP (last 3 results) Recent Labs    05/19/20 0958 09/07/20 1047 02/03/21 1329  BNP 1,456.0* 792.8* 1,441.7*     ProBNP (last 3 results) No results for input(s): PROBNP in the last 8760 hours.    Other results:  Imaging: DG Chest 2 View  Result Date: 02/04/2021 CLINICAL DATA:  Chest discomfort EXAM: CHEST - 2 VIEW COMPARISON:  02/03/2021 FINDINGS: Right chest wall ICD noted with lead in the right ventricle. Mild cardiac enlargement, stable. No pleural effusion or edema. No airspace densities. IMPRESSION: No active cardiopulmonary abnormalities. Electronically Signed   By: Kerby Moors M.D.   On: 02/04/2021 09:03   DG Chest Portable 1 View  Result Date: 02/03/2021 CLINICAL DATA:  Chest pain. EXAM: PORTABLE CHEST 1 VIEW COMPARISON:  05/18/2017. FINDINGS: Similar enlarged cardiac silhouette. Similar right subclavian approach single lead  cardiac rhythm maintenance device. Mild left lateral basilar opacity. No definite pleural effusions. No visible pneumothorax. No acute osseous abnormality. IMPRESSION: 1. Mild left lateral lung base opacity, which could represent atelectasis or pneumonia. Dedicated PA and lateral radiographs could better characterize if clinically indicated. 2. Similar cardiomegaly. Electronically Signed   By: Margaretha Sheffield M.D.   On: 02/03/2021 13:11   ECHOCARDIOGRAM COMPLETE  Result Date: 02/04/2021    ECHOCARDIOGRAM REPORT   Patient Name:   Roy Dyer Date of Exam: 02/04/2021 Medical Rec #:  [id number]    Height:       67.0 in Accession #:    [id number]   Weight:       215.0 lb Date of Birth:  [birth year]    BSA:          2.085 m Patient Age:    55 years     BP:            116/80 mmHg Patient Gender: M            HR:           74 bpm. Exam Location:  Inpatient Procedure: 2D Echo, Cardiac Doppler and Color Doppler Indications:    CHF  History:        Patient has prior history of Echocardiogram examinations, most                 recent 10/13/2020. CHF and Cardiomyopathy, Previous Myocardial                 Infarction and CAD, Defibrillator, Arrythmias:Atrial Flutter;                 Signs/Symptoms:Chest Pain. Post op renal transplant.  Sonographer:    Dustin Flock RDCS Referring Phys: [id number] Daquavion Catala R Cayce Paschal IMPRESSIONS  1. Left ventricular ejection fraction, by estimation, is 30 to 35%. The left ventricle has moderately decreased function. The left ventricle demonstrates global hypokinesis. The left ventricular internal cavity size was moderately dilated. There is mild  left ventricular hypertrophy. Left ventricular diastolic parameters are consistent with Grade II diastolic dysfunction (pseudonormalization).  2. Right ventricular systolic function is normal. The right ventricular size is normal. There is moderately elevated pulmonary artery systolic pressure. The estimated right ventricular systolic pressure is 46.9 mmHg.  3. Left atrial size was severely dilated.  4. Right atrial size was severely dilated.  5. The mitral valve is normal in structure. Trivial mitral valve regurgitation. No evidence of mitral stenosis.  6. Tricuspid valve regurgitation is moderate.  7. The aortic valve is normal in structure. Aortic valve regurgitation is not visualized. Mild aortic valve sclerosis is present, with no evidence of aortic valve stenosis.  8. The inferior vena cava is dilated in size with <50% respiratory variability, suggesting right atrial pressure of 15 mmHg. Comparison(s): No significant change from prior study. Prior images reviewed side by side. FINDINGS  Left Ventricle: Left ventricular ejection fraction, by estimation, is 30 to 35%. The left ventricle has moderately  decreased function. The left ventricle demonstrates global hypokinesis. The left ventricular internal cavity size was moderately dilated. There is mild left ventricular hypertrophy. Left ventricular diastolic parameters are consistent with Grade II diastolic dysfunction (pseudonormalization). Right Ventricle: The right ventricular size is normal. No increase in right ventricular wall thickness. Right ventricular systolic function is normal. There is moderately elevated pulmonary artery systolic pressure. The tricuspid regurgitant velocity is 3.20 m/s, and with an assumed right atrial pressure of 15 mmHg, the estimated  right ventricular systolic pressure is 09.6 mmHg. Left Atrium: Left atrial size was severely dilated. Right Atrium: Right atrial size was severely dilated. Pericardium: There is no evidence of pericardial effusion. Mitral Valve: The mitral valve is normal in structure. Trivial mitral valve regurgitation. No evidence of mitral valve stenosis. Tricuspid Valve: The tricuspid valve is normal in structure. Tricuspid valve regurgitation is moderate . No evidence of tricuspid stenosis. Aortic Valve: The aortic valve is normal in structure. Aortic valve regurgitation is not visualized. Mild aortic valve sclerosis is present, with no evidence of aortic valve stenosis. Pulmonic Valve: The pulmonic valve was normal in structure. Pulmonic valve regurgitation is not visualized. No evidence of pulmonic stenosis. Aorta: The aortic root is normal in size and structure. Venous: The inferior vena cava is dilated in size with less than 50% respiratory variability, suggesting right atrial pressure of 15 mmHg. IAS/Shunts: No atrial level shunt detected by color flow Doppler. Additional Comments: A device lead is visualized in the right ventricle.  LEFT VENTRICLE PLAX 2D LVIDd:         6.60 cm      Diastology LVIDs:         5.60 cm      LV e' medial:    7.83 cm/s LV PW:         1.30 cm      LV E/e' medial:  11.2 LV IVS:         1.40 cm      LV e' lateral:   9.25 cm/s LVOT diam:     2.40 cm      LV E/e' lateral: 9.5 LV SV:         93 LV SV Index:   45 LVOT Area:     4.52 cm  LV Volumes (MOD) LV vol d, MOD A4C: 216.0 ml LV vol s, MOD A4C: 134.0 ml LV SV MOD A4C:     216.0 ml RIGHT VENTRICLE RV Basal diam:  3.50 cm RV S prime:     12.20 cm/s TAPSE (M-mode): 2.8 cm LEFT ATRIUM              Index       RIGHT ATRIUM           Index LA diam:        5.50 cm  2.64 cm/m  RA Area:     20.30 cm LA Vol (A2C):   101.0 ml 48.43 ml/m RA Volume:   59.90 ml  28.72 ml/m LA Vol (A4C):   100.0 ml 47.95 ml/m LA Biplane Vol: 105.0 ml 50.35 ml/m  AORTIC VALVE LVOT Vmax:   111.00 cm/s LVOT Vmean:  73.700 cm/s LVOT VTI:    0.206 m  AORTA Ao Root diam: 3.30 cm MITRAL VALVE               TRICUSPID VALVE MV Area (PHT): 5.97 cm    TR Peak grad:   41.0 mmHg MV Decel Time: 127 msec    TR Vmax:        320.00 cm/s MV E velocity: 87.50 cm/s MV A velocity: 30.00 cm/s  SHUNTS MV E/A ratio:  2.92        Systemic VTI:  0.21 m                            Systemic Diam: 2.40 cm Candee Furbish MD Electronically signed by Candee Furbish MD Signature Date/Time: 02/04/2021/3:22:25 PM  Final      Medications:     Scheduled Medications:  acetaminophen  650 mg Oral Q8H   Or   acetaminophen  650 mg Rectal Q8H   apixaban  5 mg Oral BID   atorvastatin  80 mg Oral QHS   clopidogrel  75 mg Oral Daily   colchicine  0.6 mg Oral Daily   dapagliflozin propanediol  10 mg Oral QAC breakfast   famotidine  40 mg Oral QHS   mexiletine  300 mg Oral BID   mycophenolate  750 mg Oral BID   nebivolol  5 mg Oral Daily   predniSONE  5 mg Oral Daily   sacubitril-valsartan  1 tablet Oral BID   spironolactone  12.5 mg Oral Daily   tacrolimus  0.5 mg Oral Q12H    Infusions:  sodium chloride Stopped (02/03/21 2329)   amiodarone 30 mg/hr (02/04/21 2352)    PRN Medications:    Assessment&Plan:   1. New onset atrial flutter: -Multiple AT/AF episodes since last  interrogation. Exact onset not known.  -Remains in NSR on IV amio. Will switch to po  -Continue Eliquis 5 mg BID -Echo 02/04/21: EF 30-35% No effusion    2. Pleuritic chest pain: -? Pericarditis given symptoms and recent vaccine. Will treat empirically with colchicine 0.6 mg BID. No NSAIDs as he is post renal transplant and Scr 1.6. - Still with pleuritic CP on colchicine and tramadol. VQ results pending - No effusion on echo - Can use colchicine 0.6 bid (per study guidelines for acute pericarditis) for several days with SCr 1.8. Risk of short-term adverse events is very low   3. Acute on chronic systolic heart failure/iCM - HX ICD in 2012 - Echo 12/17 EF 30%  - Cath 04/22/20 for worsening HF symptoms. Stable CAD. - Echo 6/22 EF 30-35%, Grade III DD, severe LAE, mild/mod TR - Echo 02/04/21: EF 30-35% No effusion  - Volume status improved - Continue Farxiga 10 daily.  - Continue Entresto 49/51 mg bid.  - Continue Bystolic 5 mg daily.  - Continue Spiro 12.5 mg daily   3.  CAD with prior MI x2 and ischemic cardiomyopathy - Multiple stents to LAD, RCA and LCX - Cath 04/22/20 for worsening HF symptoms with stable disease - HS troponin flat Does not appear to be ACS. ECG nonischemic. -Stop aspirin when starting eliquis. Continue clopidogrel. On statin and beta blocker. - No change\  4.  AKI on CKD 3a s/p renal transplant in 2012 (due to SLE): - Follows with nephrology, Dr. Clover Mealy and Dr. Lissa Merlin at [facility]. - Baseline Creatinine 1.4-1.6, stable at 1.8 today. Holding diuretics - Continue prednisone, Prograf and CellCept.   5. Frequent PVCs - Zio 12/21 13.4% PVC burden on Zio with runs of SVT & NSVT. - PVCs reduced on mexilitene 300 mg bid.  - Zio (5/22): PVC 11.6 % burden - Zio (8/22): PVC 11.8% burden   Length of Stay: 2   [name] MD 02/05/2021, 10:00 AM  Advanced Heart Failure Team Pager [phone] (M-F; [place])  Please contact [facility] for night-coverage after  hours (4p -7a ) and weekends on [email or web address]

## 2021-02-05 NOTE — Progress Notes (Signed)
Refused cpap.

## 2021-02-05 NOTE — Hospital Course (Addendum)
Roy Dyer is a 55 y.o. male presenting with chest pain and nausea. PMH is significant for severe premature CAD, ischemic CM/chronic systolic HF EF 00-45%, frequent PVCs, s/p AICD, SLE s/p renal transplant 2012, OSA on CPAP, HLD, HTN, prediabetes.     Chest Pain  acute on chronic combined HFrEF 30-35%  Pericarditis  Patient presents after event of chest pain radiating into his neck and left side of his upper back.  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 Patient was tachycardic to 109 and tachypneic to 28 on admission, with EKG demonstrating atrial flutter with age-indeterminate inferior and anterior infarct, with abnormal lateral Q waves. Labs demonstrated a glucose of 192 with a creatinine of 1.61 (baseline of 1.6), with a BNP of 1441.7 (792 4 months ago) and troponins of 21 and 20.  WBC was 12.5, Procalcitonin was 0.31, and a urinalysis showing > 500 glucose. CXR demonstrated mild left lateral lung base opacity that was concerning for atelectasis or pneumonia. Physical exam demonstrated crackles in lower lung fields, with heart exam notable for pericardial knock, and pleuritic chest pain.  Patient was given one dose of doxycycline and ceftriaxone for concern of pneumonia- later discontinued as primary diagnosis pericarditis. Cardiology was consulted and patient was later started on Colchicine for concern of pericarditis and tramadol BID for chest pain. Patient received diuresis. Home meds were continued, including Farxiga 10 daily, Spironolactone 99.7 mg daily, Bystolic 5 mg daily, Entresto 49/51 BID. However, these were later held due to AKI. At discharge Farxiga, Spironolactone, Bystolic, and Entresto were held in the setting of an AKI and low blood pressures, see below. Patient improved with low dose colchicine and diuresis.  Atrial Flutter  Frequent PVCs w/ AICD Patient noted to be in atrial flutter on EKG.  Patient  with history of frequent PVCs (11.8% per 8/22 Zio) with AICD. Patient AICD had 41 AT/AF episodes since 8/19. Cardiology was consulted and patient was started on IV Amiodarone 9/23. Patient converted to NS rhythm 9/24 and was transitioned to oral amiodarone 9/25. Patient remained on home meds Plavix and Eliquis, but aspirin was held.  AKI Patient presented with a Cr of 1.61 on admission, with his baseline being 1.4-1.6. His Cr rose as high as 2.04 during admission. We gave him a 500 cc fluid bolus and held his home meds (Farxiga, Spironolactone, Entresto, and Bystolic-held for soft BP). Nephrology team was following and found no evidence for proximal tubulopathy, or SLE flare (unremarkable C3, C4, Anti-DNA, ANA), as etiology for AKI. At discharge patient Cr had improved to 1.95. We continued to hold his home meds at discharge, with expected close follow up with PCP and nephrology transplant specialist.   All other problems were chronic and stable.

## 2021-02-05 NOTE — Progress Notes (Signed)
[name] Interim Night Progress Note  S:Patient sleeping comfortably.  Rounded with primary night RN.  No concerns voiced.  No orders required.    O: Today's Vitals   02/04/21 1950 02/04/21 2354 02/05/21 0254 02/05/21 0354  BP:  93/60 100/61   Pulse:  (!) 59    Resp:  17 18   Temp:  97.6 F (36.4 C) 97.8 F (36.6 C)   TempSrc:  Oral Oral   SpO2:  95% 96%   Weight:      Height:      PainSc: 3   3  2        A/P: Continue current management  A.Flutter Converted to SR yesterday and remains in SR On eliquis  Pleuritic chest pain Improved with Tramadol and Colchicine VQ scan pending ECHO shows LVEF 30-35%, unchanged from previous   Carollee Leitz MD PGY-3, [place] [facility] Service pager [phone]

## 2021-02-06 ENCOUNTER — Inpatient Hospital Stay (HOSPITAL_COMMUNITY): Payer: Managed Care, Other (non HMO)

## 2021-02-06 ENCOUNTER — Other Ambulatory Visit (HOSPITAL_COMMUNITY): Payer: Self-pay

## 2021-02-06 DIAGNOSIS — N170 Acute kidney failure with tubular necrosis: Secondary | ICD-10-CM | POA: Diagnosis not present

## 2021-02-06 DIAGNOSIS — R079 Chest pain, unspecified: Secondary | ICD-10-CM | POA: Diagnosis not present

## 2021-02-06 DIAGNOSIS — I4892 Unspecified atrial flutter: Secondary | ICD-10-CM | POA: Diagnosis not present

## 2021-02-06 DIAGNOSIS — I5023 Acute on chronic systolic (congestive) heart failure: Secondary | ICD-10-CM | POA: Diagnosis not present

## 2021-02-06 DIAGNOSIS — Z940 Kidney transplant status: Secondary | ICD-10-CM | POA: Diagnosis not present

## 2021-02-06 DIAGNOSIS — I483 Typical atrial flutter: Secondary | ICD-10-CM | POA: Diagnosis not present

## 2021-02-06 LAB — URINALYSIS, ROUTINE W REFLEX MICROSCOPIC
Bacteria, UA: NONE SEEN
Bilirubin Urine: NEGATIVE
Glucose, UA: >=500 mg/dL — AB
Hgb urine dipstick: NEGATIVE
Ketones, ur: NEGATIVE mg/dL
Leukocytes,Ua: NEGATIVE
Nitrite: NEGATIVE
Protein, ur: NEGATIVE mg/dL
Specific Gravity, Urine: 1.012 (ref 1.005–1.030)
pH: 5 (ref 5.0–8.0)

## 2021-02-06 LAB — CBC
HCT: 49.5 % (ref 39.0–52.0)
Hemoglobin: 16.1 g/dL (ref 13.0–17.0)
MCH: 29.4 pg (ref 26.0–34.0)
MCHC: 32.5 g/dL (ref 30.0–36.0)
MCV: 90.5 fL (ref 80.0–100.0)
Platelets: 181 10*3/uL (ref 150–400)
RBC: 5.47 MIL/uL (ref 4.22–5.81)
RDW: 13.7 % (ref 11.5–15.5)
WBC: 14.4 10*3/uL — ABNORMAL HIGH (ref 4.0–10.5)
nRBC: 0 % (ref 0.0–0.2)

## 2021-02-06 LAB — BASIC METABOLIC PANEL
Anion gap: 11 (ref 5–15)
BUN: 33 mg/dL — ABNORMAL HIGH (ref 6–20)
CO2: 26 mmol/L (ref 22–32)
Calcium: 8.9 mg/dL (ref 8.9–10.3)
Chloride: 94 mmol/L — ABNORMAL LOW (ref 98–111)
Creatinine, Ser: 2.04 mg/dL — ABNORMAL HIGH (ref 0.61–1.24)
GFR, Estimated: 38 mL/min — ABNORMAL LOW (ref >60)
Glucose, Bld: 95 mg/dL (ref 70–99)
Potassium: 4 mmol/L (ref 3.5–5.1)
Sodium: 131 mmol/L — ABNORMAL LOW (ref 135–145)

## 2021-02-06 LAB — PHOSPHORUS: Phosphorus: 3.8 mg/dL (ref 2.5–4.6)

## 2021-02-06 LAB — TACROLIMUS LEVEL: Tacrolimus (FK506) - LabCorp: 8.2 ng/mL (ref 2.0–20.0)

## 2021-02-06 LAB — MAGNESIUM: Magnesium: 2.3 mg/dL (ref 1.7–2.4)

## 2021-02-06 MED ORDER — SODIUM CHLORIDE 0.9 % IV BOLUS
500.0000 mL | Freq: Once | INTRAVENOUS | Status: AC
  Administered 2021-02-06: 500 mL via INTRAVENOUS

## 2021-02-06 MED ORDER — SACUBITRIL-VALSARTAN 24-26 MG PO TABS: 1.0000 | ORAL_TABLET | Freq: Two times a day (BID) | ORAL | Status: DC

## 2021-02-06 NOTE — TOC Initial Note (Signed)
Transition of Care [facility]) - Initial/Assessment Note    Patient Details  Name: Roy Dyer MRN: [id number] Date of Birth: 11-07-1965  Transition [facility]) CM/SW Contact:    Erenest Rasher, RN Phone Number: [phone] 02/06/2021, 10:59 AM  Clinical Narrative:                  HF TOC CM spoke to pt and explained he will receive meds from [facility] prior to dc. He will receive Eliquis 30 day free trial and provided pt with $10 copay card for Eliquis. Pharmacy will check copay for colchicine.    Expected Discharge Plan: Home/Self Care Barriers to Discharge: Continued Medical Work up   Patient Goals and CMS Choice Patient states their goals for this hospitalization and ongoing recovery are:: wants to remain independent CMS Medicare.gov Compare Post Acute Care list provided to:: Patient    Expected Discharge Plan and Services Expected Discharge Plan: Home/Self Care In-house Referral: Clinical Social Work Discharge Planning Services: CM Consult   Living arrangements for the past 2 months: Single Family Home                                      Prior Living Arrangements/Services Living arrangements for the past 2 months: Single Family Home Lives with:: Spouse Patient language and need for interpreter reviewed:: Yes        Need for Family Participation in Patient Care: No (Comment) Care giver support system in place?: No (comment)   Criminal Activity/Legal Involvement Pertinent to Current Situation/Hospitalization: No - Comment as needed  Activities of Daily Living Home Assistive Devices/Equipment: None ADL Screening (condition at time of admission) Patient's cognitive ability adequate to safely complete daily activities?: Yes Is the patient deaf or have difficulty hearing?: No Does the patient have difficulty seeing, even when wearing glasses/contacts?: No Does the patient have difficulty concentrating, remembering, or making decisions?: No Patient  able to express need for assistance with ADLs?: No Does the patient have difficulty dressing or bathing?: No Independently performs ADLs?: Yes (appropriate for developmental age) Does the patient have difficulty walking or climbing stairs?: No Weakness of Legs: None Weakness of Arms/Hands: None  Permission Sought/Granted Permission sought to share information with : Case Manager, Family Supports, PCP Permission granted to share information with : Yes, Verbal Permission Granted  Share Information with NAME: Roy Dyer     Permission granted to share info w Relationship: wife  Permission granted to share info w Contact Information: [phone]  Emotional Assessment Appearance:: Appears stated age Attitude/Demeanor/Rapport: Gracious, Engaged Affect (typically observed): Accepting Orientation: : Oriented to Place, Oriented to  Time, Oriented to Self, Oriented to Situation   Psych Involvement: No (comment)  Admission diagnosis:  S/P ICD (internal cardiac defibrillator) procedure [Z95.810] Immunosuppression ([place]) [D84.9] History of renal transplant [Z94.0] Heart failure ([place]) [I50.9] Atrial flutter, unspecified type ([place]) [I48.92] Chest pain, unspecified type [B09.6] Systolic heart failure, unspecified HF chronicity ([place]) [I50.20] Patient Active Problem List   Diagnosis Date Noted   Chest discomfort    Acute kidney injury (AKI) with acute tubular necrosis (ATN) (HCC)    Atrial flutter (HCC)    Chest pain    Angina pectoris ([place])    Benign neoplasm of transverse colon    NSVT (nonsustained ventricular tachycardia) ([place]) 08/17/2019   Obstructive sleep apnea 01/18/2017   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   Chronic renal insufficiency, stage  II (mild)    Cough with hemoptysis 07/15/2016   Hemoptysis 07/15/2016   Barrett's esophagus 04/17/2016   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/17/2016   GERD (gastroesophageal reflux disease) 12/31/2013   History of renal transplant 09/02/2013    S/P coronary artery stent placement 12/24/2011   S/P ICD (internal cardiac defibrillator) procedure 12/24/2011   Heart failure ([place]) 12/24/2011   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[place]) 12/24/2011   Coronary artery disease with exertional angina ([place]) 09/12/2011   Ulnar neuropathy of both upper extremities 07/30/2011   Raynaud's disease 06/25/2011   Automatic implantable cardioverter-defibrillator in situ 03/07/2011   Erectile dysfunction 10/23/2010   Mixed hyperlipidemia 08/10/2009   Essential hypertension, benign 08/10/2009   ischemic cardiomyopathy status post anterolateral MI 02/01/2009   LUPUS 02/01/2009   PCP:  [name], MD Pharmacy:   [facility] [place] [address] Phone: [phone] Fax: [phone]  [id number] [place], [address] [place] [address] Phone: [phone] Fax: [phone]     Social Determinants of Health (SDOH) Interventions    Readmission Risk Interventions No flowsheet data found.

## 2021-02-06 NOTE — Consult Note (Signed)
Reason for Consult:Renal failure Referring Physician:  Dr. Holley Bouche  Chief Complaint: chest pain  Assessment/Plan: Acute renal failure on CKD 3a w/ BL cr 1.4-1.6 followed by Dr. Moshe Cipro and Dr. Esaw Dace @ [facility]. Unlikely to be flare given h/o ESRD + on Cellcept post transplant. Other DDx are prerenal azotemia (has not had a good appetite and appears to be relatively euvolemic and certainly not in florid CHF) vs proximal tubulopathy from the Colchicine. - Will send serologies, check urine microscopy and urine studies. - Agree with gentle hydration + holding spironlactone, entresto and [place]. - Continue immunosuppresives and will check a tac trough as well. - Ok to continue the Colchicine for now (already on low dose) + will check phos, mg for any e/o proximal tubulopathy. If Colchicine has to be stopped then can increase steroids for 1 week with taper if needed but I'm hopeful to avoid steroid escalation. Pleuritic chest pain thought to be secondary to pericarditis - improving already. CHF relatively well compensated.    HPI: Roy Dyer is an 55 y.o. 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 Pain was improved when sitting up. He has a dry intermittent cough and was treated for CHF with a presenting BNP of 1400 with Lasix 75m IV x1; he sees Dr. BHaroldine Lawsfor his heart failure and EDW is 212-213 lbs. His baseline creatinine is 1.4-1.6. Patient started on Colchicine 0.639mqd for pericarditis.  He is on Cellcept 75081mID, Prednisone 5mg59md Prograf 0.5mg 71mhrs.   ROS Pertinent items are noted in HPI.  Chemistry and CBC: Creatinine  Date/Time Value Ref Range Status  02/02/2020 12:00 AM 1.4 (A) 0.6 - 1.3 Final  10/07/2019 12:00 AM  1.3 0.6 - 1.3 Final  07/31/2019 12:00 AM 1.4 (A) 0.6 - 1.3 Final  07/17/2018 12:00 AM 1.2 0.6 - 1.3 Final  04/15/2018 12:00 AM 1.3 0.6 - 1.3 Final  01/21/2018 12:00 AM 1.2 0.6 - 1.3 Final  07/23/2017 12:00 AM 1.3 0.6 - 1.3 Final   Creatinine, Ser  Date/Time Value Ref Range Status  02/06/2021 01:23 AM 2.04 (H) 0.61 - 1.24 mg/dL Final  02/05/2021 02:04 AM 1.84 (H) 0.61 - 1.24 mg/dL Final  02/04/2021 05:43 AM 1.86 (H) 0.61 - 1.24 mg/dL Final  02/03/2021 01:28 PM 1.61 (H) 0.61 - 1.24 mg/dL Final  02/03/2021 01:15 PM 1.60 (H) 0.61 - 1.24 mg/dL Final  09/07/2020 10:47 AM 1.46 (H) 0.61 - 1.24 mg/dL Final  07/26/2020 09:26 AM 1.62 (H) 0.40 - 1.50 mg/dL Final  07/07/2020 08:27 AM 1.71 (H) 0.40 - 1.50 mg/dL Final  05/19/2020 09:58 AM 1.57 (H) 0.61 - 1.24 mg/dL Final  04/26/2020 01:03 PM 1.46 (H) 0.61 - 1.24 mg/dL Final  04/19/2020 11:46 AM 1.26 0.76 - 1.27 mg/dL Final  01/06/2020 08:45 AM 1.28 0.40 - 1.50 mg/dL Final  03/08/2019 09:32 AM 1.37 (H) 0.61 - 1.24 mg/dL Final  03/28/2018 08:15 AM 1.35 (H) 0.76 - 1.27 mg/dL Final  05/18/2017 02:20 AM 1.40 (H) 0.61 - 1.24 mg/dL Final  02/27/2017 08:22 AM 1.23 0.40 - 1.50 mg/dL Final  02/09/2017 03:16 AM 1.22 0.61 - 1.24 mg/dL Final  02/06/2017 11:30 AM 1.02 0.76 - 1.27 mg/dL Final  08/28/2016 07:57 AM 1.33 0.40 - 1.50 mg/dL Final  08/13/2016 08:28 AM 1.33 0.40 - 1.50 mg/dL Final  08/08/2016 08:45 AM 1.46 0.40 - 1.50 mg/dL Final  08/01/2016 03:22 PM 1.26 0.40 - 1.50 mg/dL Final  07/17/2016 05:42 AM 1.52 (H) 0.61 - 1.24 mg/dL Final  07/16/2016 07:00 AM 1.40 (H) 0.61 - 1.24 mg/dL Final  07/15/2016 05:45 PM 1.32 (H) 0.61 - 1.24 mg/dL Final  04/18/2016 08:22 AM 1.33 0.40 - 1.50 mg/dL Final  04/17/2013 05:07 AM 1.50 (H) 0.50 - 1.35 mg/dL Final  07/20/2011 06:00 AM 3.39 (H) 0.50 - 1.35 mg/dL Final    Comment:    DELTA CHECK NOTED  07/19/2011 10:43 PM 5.28 (H) 0.50 - 1.35 mg/dL Final  07/18/2011 05:40 AM 4.93 (H) 0.50 - 1.35 mg/dL Final  07/17/2011  05:46 AM 6.81 (H) 0.50 - 1.35 mg/dL Final  07/16/2011 06:35 AM 6.60 (H) 0.50 - 1.35 mg/dL Final  07/15/2011 05:55 AM 6.27 (H) 0.50 - 1.35 mg/dL Final  07/14/2011 11:57 AM 5.61 (H) 0.50 - 1.35 mg/dL Final  07/13/2011 06:45 AM 6.79 (H) 0.50 - 1.35 mg/dL Final  07/12/2011 06:00 AM 6.71 (H) 0.50 - 1.35 mg/dL Final  07/11/2011 09:45 AM 6.24 (H) 0.50 - 1.35 mg/dL Final  07/10/2011 05:28 AM 5.91 (H) 0.50 - 1.35 mg/dL Final  07/02/2011 11:29 AM 4.83 (H) 0.50 - 1.35 mg/dL Final  06/27/2011 09:49 AM 5.0 (HH) 0.4 - 1.5 mg/dL Final  04/23/2011 08:20 AM 4.4 (H) 0.4 - 1.5 mg/dL Final  03/28/2011 07:34 AM 4.87 (H) 0.50 - 1.35 mg/dL Final  02/22/2011 09:40 AM 4.7 (HH) 0.4 - 1.5 mg/dL Final  11/29/2010 09:57 AM 4.3 (H) 0.4 - 1.5 mg/dL Final  11/21/2010 11:30 AM 3.24 (H) 0.50 - 1.35 mg/dL Final    Comment:    **Please note change in reference range.**  11/20/2010 07:20 AM 3.70 (H) 0.50 - 1.35 mg/dL Final  10/29/2010 04:30 AM 3.00 (H) 0.50 - 1.35 mg/dL Final    Comment:    **Please note change in reference range.**  10/23/2010 09:39 AM 3.3 (H) 0.4 - 1.5 mg/dL Final  09/15/2010 09:00 AM 4.30 (H) 0.4 - 1.5 mg/dL Final  08/07/2010 04:06 AM 3.18 (H) 0.4 - 1.5 mg/dL Final  08/06/2010 04:55 AM 3.29 (H) 0.4 - 1.5 mg/dL Final  08/05/2010 05:00 AM 3.17 (H) 0.4 - 1.5 mg/dL Final   Recent Labs  Lab 02/03/21 1315 02/03/21 1328 02/04/21 0543 02/05/21 0204 02/06/21 0123  NA 138 137 137 136 131*  K 4.8 4.9 4.7 4.4 4.0  CL 102 104 99 99 94*  CO2  --  26 25 26 26   GLUCOSE 185* 192* 135* 115* 95  BUN 22* 20 22* 24* 33*  CREATININE 1.60* 1.61* 1.86* 1.84* 2.04*  CALCIUM  --  9.1 9.4 9.0 8.9   Recent Labs  Lab 02/03/21 1328 02/04/21 0640 02/05/21 0204 02/06/21 0123  WBC 12.5* 14.2* 14.9* 14.4*  HGB 16.9 17.2* 15.7 16.1  HCT 53.0* 52.1* 49.4 49.5  MCV 92.7 89.4 90.0 90.5  PLT 170 171 171 181   Liver Function Tests: Recent Labs  Lab 02/03/21 1328  AST 17  ALT 14  ALKPHOS 92  BILITOT 0.7  PROT  5.6*  ALBUMIN 3.5   Recent Labs  Lab 02/03/21 1328  LIPASE 28   No results for input(s): AMMONIA in the last 168 hours. Cardiac Enzymes: No results for input(s): CKTOTAL, CKMB, CKMBINDEX, TROPONINI in the last 168 hours. Iron Studies: No results for input(s): IRON, TIBC, TRANSFERRIN, FERRITIN in the last 72 hours. PT/INR: @LABRCNTIP (inr:5)  Xrays/Other Studies: ) Results for orders placed or performed during the hospital encounter of 02/03/21 (from the past 48 hour(s))  Basic metabolic panel     Status: Abnormal   Collection Time: 02/05/21  2:04 AM  Result Value Ref Range   Sodium 136 135 - 145 mmol/L   Potassium 4.4 3.5 - 5.1 mmol/L   Chloride 99 98 - 111 mmol/L   CO2 26 22 - 32 mmol/L   Glucose, Bld 115 (H) 70 - 99 mg/dL    Comment: Glucose reference range applies only to samples taken after fasting for at least 8 hours.   BUN 24 (H) 6 - 20 mg/dL   Creatinine, Ser 1.84 (H) 0.61 - 1.24 mg/dL   Calcium 9.0 8.9 - 10.3 mg/dL   GFR, Estimated 43 (L) >60 mL/min    Comment: (NOTE) Calculated using the CKD-EPI Creatinine Equation (2021)    Anion gap 11 5 - 15    Comment: Performed at [facility] [address], [place], [place] [address]  CBC     Status: Abnormal   Collection Time: 02/05/21  2:04 AM  Result Value Ref Range   WBC 14.9 (H) 4.0 - 10.5 K/uL   RBC 5.49 4.22 - 5.81 MIL/uL   Hemoglobin 15.7 13.0 - 17.0 g/dL   HCT 49.4 39.0 - 52.0 %   MCV 90.0 80.0 - 100.0 fL   MCH 28.6 26.0 - 34.0 pg   MCHC 31.8 30.0 - 36.0 g/dL   RDW 13.9 11.5 - 15.5 %   Platelets 171 150 - 400 K/uL   nRBC 0.0 0.0 - 0.2 %    Comment: Performed at [facility], [place] [address] [place], [place] [address]  Procalcitonin     Status: None   Collection Time: 02/05/21  2:04 AM  Result Value Ref Range   Procalcitonin 0.39 ng/mL    Comment:        Interpretation: PCT (Procalcitonin) <= 0.5 ng/mL: Systemic infection (sepsis) is not likely. Local bacterial infection is  possible. (NOTE)       Sepsis PCT Algorithm           Lower Respiratory Tract                                      Infection PCT Algorithm    ----------------------------     ----------------------------         PCT < 0.25 ng/mL                PCT < 0.10 ng/mL          Strongly encourage             Strongly discourage   discontinuation of antibiotics    initiation of antibiotics    ----------------------------     -----------------------------       PCT 0.25 - 0.50 ng/mL            PCT 0.10 - 0.25 ng/mL               OR       >80% decrease in PCT            Discourage initiation of  antibiotics      Encourage discontinuation           of antibiotics    ----------------------------     -----------------------------         PCT >= 0.50 ng/mL              PCT 0.26 - 0.50 ng/mL               AND        <80% decrease in PCT             Encourage initiation of                                             antibiotics       Encourage continuation           of antibiotics    ----------------------------     -----------------------------        PCT >= 0.50 ng/mL                  PCT > 0.50 ng/mL               AND         increase in PCT                  Strongly encourage                                      initiation of antibiotics    Strongly encourage escalation           of antibiotics                                     -----------------------------                                           PCT <= 0.25 ng/mL                                                 OR                                        > 80% decrease in PCT                                      Discontinue / Do not initiate                                             antibiotics  Performed at [facility], [place] [address], [place], [place] [address]   Basic metabolic panel     Status: Abnormal   Collection  Time: 02/06/21  1:23 AM  Result Value Ref Range   Sodium 131 (L)  135 - 145 mmol/L   Potassium 4.0 3.5 - 5.1 mmol/L   Chloride 94 (L) 98 - 111 mmol/L   CO2 26 22 - 32 mmol/L   Glucose, Bld 95 70 - 99 mg/dL    Comment: Glucose reference range applies only to samples taken after fasting for at least 8 hours.   BUN 33 (H) 6 - 20 mg/dL   Creatinine, Ser 2.04 (H) 0.61 - 1.24 mg/dL   Calcium 8.9 8.9 - 10.3 mg/dL   GFR, Estimated 38 (L) >60 mL/min    Comment: (NOTE) Calculated using the CKD-EPI Creatinine Equation (2021)    Anion gap 11 5 - 15    Comment: Performed at [facility] [address] [place], [place] [address]  CBC     Status: Abnormal   Collection Time: 02/06/21  1:23 AM  Result Value Ref Range   WBC 14.4 (H) 4.0 - 10.5 K/uL   RBC 5.47 4.22 - 5.81 MIL/uL   Hemoglobin 16.1 13.0 - 17.0 g/dL   HCT 49.5 39.0 - 52.0 %   MCV 90.5 80.0 - 100.0 fL   MCH 29.4 26.0 - 34.0 pg   MCHC 32.5 30.0 - 36.0 g/dL   RDW 13.7 11.5 - 15.5 %   Platelets 181 150 - 400 K/uL   nRBC 0.0 0.0 - 0.2 %    Comment: Performed at [facility], [place] [address] [place], [place] [address]  Urinalysis, Routine w reflex microscopic Urine, Clean Catch     Status: Abnormal   Collection Time: 02/06/21 11:48 AM  Result Value Ref Range   Color, Urine YELLOW YELLOW   APPearance CLEAR CLEAR   Specific Gravity, Urine 1.012 1.005 - 1.030   pH 5.0 5.0 - 8.0   Glucose, UA >=500 (A) NEGATIVE mg/dL   Hgb urine dipstick NEGATIVE NEGATIVE   Bilirubin Urine NEGATIVE NEGATIVE   Ketones, ur NEGATIVE NEGATIVE mg/dL   Protein, ur NEGATIVE NEGATIVE mg/dL   Nitrite NEGATIVE NEGATIVE   Leukocytes,Ua NEGATIVE NEGATIVE   RBC / HPF 0-5 0 - 5 RBC/hpf   WBC, UA 0-5 0 - 5 WBC/hpf   Bacteria, UA NONE SEEN NONE SEEN   Squamous Epithelial / LPF 0-5 0 - 5   Hyaline Casts, UA PRESENT     Comment: Performed at [facility], [place] [address] [place], [place] [address]   DG Chest 1 View  Result Date: 02/05/2021 CLINICAL DATA:  Chest pain. EXAM: CHEST  1 VIEW COMPARISON:   02/04/2021 FINDINGS: Cardiac silhouette is mildly enlarged. Stable right anterior chest wall AICD. Clear lungs.  No pleural effusion or pneumothorax. Skeletal structures are grossly intact. IMPRESSION: No acute cardiopulmonary disease. Electronically Signed   By: Lajean Manes M.D.   On: 02/05/2021 14:35   NM Pulmonary Perfusion  Result Date: 02/05/2021 CLINICAL DATA:  Hx of CAD, AICD, Combined CHF, CKD III S/P renal transplant, HTN, HLD, Lupus, OSA o CPAP, PreDM presented with B/L chest pain radiating to his neck and back with a dry cough that is intermittent. EXAM: NUCLEAR MEDICINE PERFUSION LUNG SCAN TECHNIQUE: Perfusion images were obtained in multiple projections after intravenous injection of radiopharmaceutical. Ventilation scans intentionally deferred if perfusion scan and chest x-ray adequate for interpretation during COVID 19 epidemic. RADIOPHARMACEUTICALS:  3.75 mCi Tc-46mMAA IV COMPARISON:  Current chest radiograph FINDINGS: No segmental perfusion defects to suggest pulmonary thromboemboli. Focal defect  overlies the peripheral right lung from the patient's anterior chest wall AICD. Patchy relative decreased perfusion to the posterior aspect of the right upper lobe and apical segment of the right lower lobe. IMPRESSION: No evidence of a pulmonary thromboembolism. Electronically Signed   By: Lajean Manes M.D.   On: 02/05/2021 14:38   ECHOCARDIOGRAM COMPLETE  Result Date: 02/04/2021    ECHOCARDIOGRAM REPORT   Patient Name:   DETROIT FRIEDEN Date of Exam: 02/04/2021 Medical Rec #:  [id number]    Height:       67.0 in Accession #:    [id number]   Weight:       215.0 lb Date of Birth:  April 05, 1966    BSA:          2.085 m Patient Age:    47 years     BP:           116/80 mmHg Patient Gender: M            HR:           74 bpm. Exam Location:  Inpatient Procedure: 2D Echo, Cardiac Doppler and Color Doppler Indications:    CHF  History:        Patient has prior history of Echocardiogram examinations, most                  recent 10/13/2020. CHF and Cardiomyopathy, Previous Myocardial                 Infarction and CAD, Defibrillator, Arrythmias:Atrial Flutter;                 Signs/Symptoms:Chest Pain. Post op renal transplant.  Sonographer:    Dustin Flock RDCS Referring Phys: [id number] DANIEL R BENSIMHON IMPRESSIONS  1. Left ventricular ejection fraction, by estimation, is 30 to 35%. The left ventricle has moderately decreased function. The left ventricle demonstrates global hypokinesis. The left ventricular internal cavity size was moderately dilated. There is mild  left ventricular hypertrophy. Left ventricular diastolic parameters are consistent with Grade II diastolic dysfunction (pseudonormalization).  2. Right ventricular systolic function is normal. The right ventricular size is normal. There is moderately elevated pulmonary artery systolic pressure. The estimated right ventricular systolic pressure is 82.6 mmHg.  3. Left atrial size was severely dilated.  4. Right atrial size was severely dilated.  5. The mitral valve is normal in structure. Trivial mitral valve regurgitation. No evidence of mitral stenosis.  6. Tricuspid valve regurgitation is moderate.  7. The aortic valve is normal in structure. Aortic valve regurgitation is not visualized. Mild aortic valve sclerosis is present, with no evidence of aortic valve stenosis.  8. The inferior vena cava is dilated in size with <50% respiratory variability, suggesting right atrial pressure of 15 mmHg. Comparison(s): No significant change from prior study. Prior images reviewed side by side. FINDINGS  Left Ventricle: Left ventricular ejection fraction, by estimation, is 30 to 35%. The left ventricle has moderately decreased function. The left ventricle demonstrates global hypokinesis. The left ventricular internal cavity size was moderately dilated. There is mild left ventricular hypertrophy. Left ventricular diastolic parameters are consistent with Grade II diastolic  dysfunction (pseudonormalization). Right Ventricle: The right ventricular size is normal. No increase in right ventricular wall thickness. Right ventricular systolic function is normal. There is moderately elevated pulmonary artery systolic pressure. The tricuspid regurgitant velocity is 3.20 m/s, and with an assumed right atrial pressure of 15 mmHg, the estimated right ventricular systolic pressure is 41.5 mmHg. Left  Atrium: Left atrial size was severely dilated. Right Atrium: Right atrial size was severely dilated. Pericardium: There is no evidence of pericardial effusion. Mitral Valve: The mitral valve is normal in structure. Trivial mitral valve regurgitation. No evidence of mitral valve stenosis. Tricuspid Valve: The tricuspid valve is normal in structure. Tricuspid valve regurgitation is moderate . No evidence of tricuspid stenosis. Aortic Valve: The aortic valve is normal in structure. Aortic valve regurgitation is not visualized. Mild aortic valve sclerosis is present, with no evidence of aortic valve stenosis. Pulmonic Valve: The pulmonic valve was normal in structure. Pulmonic valve regurgitation is not visualized. No evidence of pulmonic stenosis. Aorta: The aortic root is normal in size and structure. Venous: The inferior vena cava is dilated in size with less than 50% respiratory variability, suggesting right atrial pressure of 15 mmHg. IAS/Shunts: No atrial level shunt detected by color flow Doppler. Additional Comments: A device lead is visualized in the right ventricle.  LEFT VENTRICLE PLAX 2D LVIDd:         6.60 cm      Diastology LVIDs:         5.60 cm      LV e' medial:    7.83 cm/s LV PW:         1.30 cm      LV E/e' medial:  11.2 LV IVS:        1.40 cm      LV e' lateral:   9.25 cm/s LVOT diam:     2.40 cm      LV E/e' lateral: 9.5 LV SV:         93 LV SV Index:   45 LVOT Area:     4.52 cm  LV Volumes (MOD) LV vol d, MOD A4C: 216.0 ml LV vol s, MOD A4C: 134.0 ml LV SV MOD A4C:     216.0 ml  RIGHT VENTRICLE RV Basal diam:  3.50 cm RV S prime:     12.20 cm/s TAPSE (M-mode): 2.8 cm LEFT ATRIUM              Index       RIGHT ATRIUM           Index LA diam:        5.50 cm  2.64 cm/m  RA Area:     20.30 cm LA Vol (A2C):   101.0 ml 48.43 ml/m RA Volume:   59.90 ml  28.72 ml/m LA Vol (A4C):   100.0 ml 47.95 ml/m LA Biplane Vol: 105.0 ml 50.35 ml/m  AORTIC VALVE LVOT Vmax:   111.00 cm/s LVOT Vmean:  73.700 cm/s LVOT VTI:    0.206 m  AORTA Ao Root diam: 3.30 cm MITRAL VALVE               TRICUSPID VALVE MV Area (PHT): 5.97 cm    TR Peak grad:   41.0 mmHg MV Decel Time: 127 msec    TR Vmax:        320.00 cm/s MV E velocity: 87.50 cm/s MV A velocity: 30.00 cm/s  SHUNTS MV E/A ratio:  2.92        Systemic VTI:  0.21 m                            Systemic Diam: 2.40 cm Candee Furbish MD Electronically signed by Candee Furbish MD Signature Date/Time: 02/04/2021/3:22:25 PM    Final     PMH:  Past Medical History:  Diagnosis Date   AICD (automatic cardioverter/defibrillator) present    Dr. Paschal Dopp follows remotely-yrly checks, Dr. Nishan,cardiology   Avascular necrosis of bone of hip ([place]) 2010 surg   Left hip arthroplasty: from chronic systemic steroids taken for his Lupus   Barrett's esophagus 2017; 2021   Blood transfusion without reported diagnosis 2010, 2012   CAD (coronary artery disease)    **DAPT long term recommended by cards**.  a. stents RCA/Circ 2001 b. BMS to LAD 07/2010  c. 01/2017: s/p DES to RCA.    Cataract    left eye   CHF (congestive heart failure) (HCC)    Chronic headaches 02/2017   As of 01/2017, HA's no better, neurologist ordered CT.  ESR normal at that time.  HA'S COMPLETELY RESOLVED AFTER HE GOT ON CPAP 2019/2020.   Chronic renal insufficiency, stage 3 (moderate) (HCC)    GFR 55-60 (sCr 1.4-1.7 range)   Erythrocytosis    Pre and post transplant->on losartan for this   GERD (gastroesophageal reflux disease)    Hx of esoph stricture and dilatation.  +Barrett's esophagus+hx of  aspiration pneumonia-->to get rpt EGD when he comes off DAPT (utd as of 01/29/18)   Gout    s/p renal transplant he was weened off of his allopurinol.   Hiatal hernia    History of adenomatous polyp of colon 09/2019   polyp x 1. Recall 5 yrs   History of end stage renal disease 04/24/2011   Secondary to SLE: HD 06/2011-10/2012 (then got renal transplant)   History of renal transplant 11/10/2012   10/2012 [facility]    Hyperlipidemia    Hypertension    implantable cardiac defibrillator single chamber    Medtronic (due to low EF)   Ischemic cardiomyopathy    Chronic systolic dysfunction--  07/8880 EF 30-35%.   Single chamber ICD 11/20/10 ([name])   Left ventricular thrombus 2012   Re-eval 02/2011 showed thrombus RESOLVED, so coumadin was d/c'd (was on it for 69mo   Lupus ([place]    OSA on CPAP 2018   Dr. [name]9/2018: +OSA on home sleep studay; CPAP auto titrate 5-20 started 05/2017.   Osteoporosis    Prediabetes 12/2019   A1c stable long term at 5.6-5.7 until jump up to 6.2% Aug 2021.    PSH:   Past Surgical History:  Procedure Laterality Date   AV FISTULA PLACEMENT  03/28/2011   Procedure: ARTERIOVENOUS (AV) FISTULA CREATION;  Surgeon: TRosetta Posner MD;  [facility]  Service: Vascular;  Laterality: Left;  Creation of Left radiocephallic cimino fistula   AV FISTULA PLACEMENT  04/27/2011   Procedure: ARTERIOVENOUS (AV) FISTULA CREATION;  Surgeon: TRosetta Posner MD;  [facility]  Service: Vascular;  Laterality: Left;  left basilic vein transposition   BIOPSY  09/14/2019   Procedure: BIOPSY;  Surgeon: PJerene Bears MD;  Location: [facility];  Service: Gastroenterology;;   CARDIAC CATHETERIZATION  08/2010; 04/22/20   08/2010; 04/2020->mild/mod in-stent restenosis but none hemodynamically signif, severe LV dysfxn, frequenct PVCs-->referred to HF clinic for optimization of med mgmt   CARDIOVASCULAR STRESS TEST  07/2010; 03/2014   03/2014 showed large old infarct and EF 30-35% but no  ischemia   COLONOSCOPY  09/14/2019   Adenoma x 1.  Recall 5 yrs   COLONOSCOPY WITH PROPOFOL N/A 09/14/2019   Procedure: COLONOSCOPY WITH PROPOFOL;  Surgeon: PJerene Bears MD;  Location: [facility];  Service: Gastroenterology;  Laterality: N/A;   CORONARY PRESSURE WIRE/FFR  WITH 3D MAPPING N/A 04/22/2020   Procedure: Coronary Pressure Wire/FFR w/3D Mapping;  Surgeon: Wellington Hampshire, MD;  Location: [facility];  Service: Cardiovascular;  Laterality: N/A;   DEXA  07/05/2017   Normal bone density in radius and spine.   ESOPHAGOGASTRODUODENOSCOPY  02/2009   Done by Dr. Fuller Plan for hematemesis; esophagitis found.  Repeat recommended 09/2016, at which time he will also likely get his initial screening colonoscopy.   ESOPHAGOGASTRODUODENOSCOPY  09/14/2019   esophagitis (improved compared to 2017 EGD), Barrett's.  Stomach and duodenum normal.   ESOPHAGOGASTRODUODENOSCOPY (EGD) WITH PROPOFOL N/A 09/29/2015   REPEAT EGD RECOMMENDED 09/2016.  Barrett's esoph + mild chronic gastritis.  H pylori NEG. Procedure: ESOPHAGOGASTRODUODENOSCOPY (EGD) WITH PROPOFOL;  Surgeon: Jerene Bears, MD;  [facility];  Service: Gastroenterology;  Laterality: N/A;   ESOPHAGOGASTRODUODENOSCOPY (EGD) WITH PROPOFOL N/A 09/14/2019   Procedure: ESOPHAGOGASTRODUODENOSCOPY (EGD) WITH PROPOFOL;  Surgeon: Jerene Bears, MD;  Location: [facility];  Service: Gastroenterology;  Laterality: N/A;   ICD GENERATOR CHANGEOUT N/A 04/11/2018   Procedure: ICD GENERATOR CHANGEOUT;  Surgeon: Deboraha Sprang, MD;  Location: [facility];  Service: Cardiovascular;  Laterality: N/A;   INSERT / REPLACE / REMOVE PACEMAKER     medtronic        dr [name]    [place]   icd only   KIDNEY TRANSPLANT  10/2012   [facility] nephrologist--Dr. Blair Heys   LEFT HEART CATH AND CORONARY ANGIOGRAPHY N/A 02/08/2017   PCA and DES to mRCA--needs DAPT for at least 6 mo.   Procedure: LEFT HEART CATH AND CORONARY ANGIOGRAPHY;  Surgeon: Sherren Mocha, MD;  Location: [facility];  Service: Cardiovascular;  Laterality: N/A;   LEFT HEART CATH AND CORONARY ANGIOGRAPHY N/A 04/22/2020   Procedure: LEFT HEART CATH AND CORONARY ANGIOGRAPHY;  Surgeon: Wellington Hampshire, MD;  Location: [facility];  Service: Cardiovascular;  Laterality: N/A;   PARTIAL HIP ARTHROPLASTY Left 2010   left   POLYPECTOMY  09/14/2019   Procedure: POLYPECTOMY;  Surgeon: Jerene Bears, MD;  Location: [facility];  Service: Gastroenterology;;   RENAL BIOPSY     stents     05-2010 and 2- in 2010 and 1 in 2018.   TOTAL HIP ARTHROPLASTY  07/09/2011   Procedure: TOTAL HIP ARTHROPLASTY;  Surgeon: Kerin Salen, MD;  Location: [facility];  Service: Orthopedics;  Laterality: Right;   TRANSTHORACIC ECHOCARDIOGRAM  10/13/2020   EF 30-35% (slight improved), grd III DD, elev PA pressure, mold/mod tricusp regurg   TYMPANOSTOMY TUBE PLACEMENT  55 yrs old   US ECHOCARDIOGRAPHY  12/2010; 02/2014;08/2014;08/2019   02/2014 EF still 25-30%, increased PA pressures.  2016 EF 30-35%, sept/inf hypokinesis, mod tricusp regurg. 08/2019 EF 25-30%, global hypok, sev dil LV w/apical aneur, grd III DD, mod tricus reg, mod pulm htn   ZIO PATCH  04/2020   HIGH PVC BURDEN.  NSVT, SVT, BBB.    Allergies:  Allergies  Allergen Reactions   Triptans Palpitations    Reaction to Maxalt    Medications:   Prior to Admission medications   Medication Sig Start Date End Date Taking? Authorizing Provider  acetaminophen (TYLENOL) 500 MG tablet Take 1,000 mg by mouth every 6 (six) hours as needed (for pain.).   Yes [provider]  allopurinol (ZYLOPRIM) 100 MG tablet Take 1 tablet (100 mg total) by mouth daily. 03/22/16  Yes McGowen, [name], MD  aspirin EC 81 MG tablet Take 81  mg by mouth daily.   Yes [provider]  atorvastatin (LIPITOR) 80 MG tablet Take 80 mg by mouth at bedtime.   Yes [provider]  BYSTOLIC 5 MG tablet TAKE 1 TABLET BY MOUTH DAILY Patient  taking differently: Take 5 mg by mouth daily. 08/16/16  Yes McGowen, [name], MD  clopidogrel (PLAVIX) 75 MG tablet TAKE 1 TABLET BY MOUTH DAILY Patient taking differently: Take 75 mg by mouth daily. 08/16/16  Yes McGowen, [name], MD  dapagliflozin propanediol (FARXIGA) 10 MG TABS tablet Take 1 tablet (10 mg total) by mouth daily before breakfast. 09/20/20  Yes McGowen, [name], MD  DEXILANT 60 MG capsule TAKE 1 CAPSULE BY MOUTH DAILY BEFORE BREAKFAST Patient taking differently: Take 60 mg by mouth daily. 09/25/16  Yes Pyrtle, Lajuan Lines, MD  famotidine (PEPCID) 20 MG tablet Take 40 mg by mouth at bedtime.    Yes [provider]  fluticasone (FLONASE) 50 MCG/ACT nasal spray Place 1 spray into both nostrils daily.   Yes [provider]  magnesium oxide (MAG-OX) 400 MG tablet Take 400 mg by mouth daily.    Yes [provider]  mexiletine (MEXITIL) 150 MG capsule Take 2 capsules (300 mg total) by mouth 2 (two) times daily. 09/23/20  Yes Bensimhon, Shaune Pascal, MD  mycophenolate (CELLCEPT) 250 MG capsule Take 3 capsules (750 mg total) by mouth 2 (two) times daily. 12/23/16  Yes McGowen, [name], MD  niacin 500 MG tablet Take 1,000 mg by mouth at bedtime.   Yes [provider]  nitroGLYCERIN (NITROSTAT) 0.4 MG SL tablet Place 1 tablet (0.4 mg total) under the tongue every 5 (five) minutes as needed for chest pain (DO NOT EXCEED 3 DOSES). 11/23/19  Yes Josue Hector, MD  predniSONE (DELTASONE) 5 MG tablet TAKE 1 TABLET BY MOUTH DAILY Patient taking differently: Take 5 mg by mouth daily with breakfast. 10/24/16  Yes McGowen, [name], MD  sacubitril-valsartan (ENTRESTO) 49-51 MG Take 1 tablet by mouth 2 (two) times daily. 09/23/20  Yes Bensimhon, Shaune Pascal, MD  tacrolimus (PROGRAF) 0.5 MG capsule Take 0.5 mg by mouth every 12 (twelve) hours.    Yes [provider]  traMADol (ULTRAM) 50 MG tablet Take 50 mg by mouth every 6 (six) hours as needed. for pain 06/06/17  Yes  [provider]  spironolactone (ALDACTONE) 25 MG tablet Take 0.5 tablets (12.5 mg total) by mouth daily. Patient not taking: No sig reported 12/30/20 03/30/21  Bensimhon, Shaune Pascal, MD    Discontinued Meds:   Medications Discontinued During This Encounter  Medication Reason   colchicine tablet 0.6 mg    colchicine tablet 0.6 mg    traMADol (ULTRAM) tablet 100 mg    acetaminophen (TYLENOL) tablet 650 mg    acetaminophen (TYLENOL) suppository 650 mg    acetaminophen (TYLENOL) tablet 650 mg    acetaminophen (TYLENOL) suppository 650 mg    sacubitril-valsartan (ENTRESTO) 49-51 mg per tablet    amiodarone (NEXTERONE PREMIX) 360-4.14 MG/200ML-% (1.8 mg/mL) IV infusion    spironolactone (ALDACTONE) tablet 12.5 mg    sacubitril-valsartan (ENTRESTO) 24-26 mg per tablet    sacubitril-valsartan (ENTRESTO) 24-26 mg per tablet    nebivolol (BYSTOLIC) tablet 5 mg    dapagliflozin propanediol (FARXIGA) tablet 10 mg     Social History:  reports that he quit smoking about 10 years ago. His smoking use included cigarettes. He has a 15.00 pack-year smoking history. He has never used smokeless tobacco. He reports  that he does not drink alcohol and does not use drugs.  Family History:   Family History  Problem Relation Age of Onset   Kidney failure Mother    Lupus Mother    Stroke Mother    Diabetes Mother        type 2   Heart attack Father        X 7   Hypertension Father    Hyperlipidemia Father    Heart attack Sister 79       X 1   Hyperlipidemia Sister    Hypertension Sister    Heart disease Sister    Heart attack Paternal Grandfather    Heart disease Paternal Aunt    Heart disease Paternal Uncle    Colon cancer Neg Hx    Esophageal cancer Neg Hx    Stomach cancer Neg Hx    Rectal cancer Neg Hx     Blood pressure 110/69, pulse 65, temperature 98.1 F (36.7 C), temperature source Oral, resp. rate 20, height 5' 7"  (1.702 m), weight 96.6 kg, SpO2 96 %. GEN: NAD, A&Ox3,  NCAT HEENT: No conjunctival pallor, EOMI NECK: Supple, no thyromegaly, no JVD LUNGS: CTA B/L no rales, rhonchi or wheezing CV: RRR, rub present, no S3 or S4 ABD: SNDNT +BS  EXT: No lower extremity edema        Khaden Gater, Hunt Oris, MD 02/06/2021, 2:58 PM

## 2021-02-06 NOTE — TOC Progression Note (Addendum)
Transition of Care [facility]) - Progression Note    Patient Details  Name: Roy Dyer MRN: [id number] Date of Birth: [birth year]  Transition [facility]) CM/SW [place], [place] Phone Number: 02/06/2021, 3:29 PM  Clinical Narrative:    HF CSW spoke with the patients wife, Roy Dyer at bedside due to Mr. Lanuza being at a procedure. Roy Dyer reported that her husband works full-time and transportation is not an issue. CSW completed a very brief SDOH with the patient's wife, Roy Dyer who denied any concerns for her husband or him having any needs at this time. Roy Dyer reported he does have a PCP at [facility] and he can get to the pharmacy to pick up his medications and [facility] [address] and mail order through [place]. CSW provided the patient's wife with the social workers name and position and if anything changes to please reach out so that the CSW can provide support.  CSW will continue to follow throughout discharge.  Expected Discharge Plan: Home/Self Care Barriers to Discharge: Continued Medical Work up  Expected Discharge Plan and Services Expected Discharge Plan: Home/Self Care In-house Referral: Clinical Social Work Discharge Planning Services: CM Consult   Living arrangements for the past 2 months: Single Family Home                                       Social Determinants of Health (SDOH) Interventions Food Insecurity Interventions: Intervention Not Indicated Financial Strain Interventions: Intervention Not Indicated Housing Interventions: Intervention Not Indicated Transportation Interventions: Intervention Not Indicated  Readmission Risk Interventions No flowsheet data found.  Darron Stuck, MSW, [place] Heart Failure Social Worker

## 2021-02-06 NOTE — TOC Benefit Eligibility Note (Signed)
Patient Teacher, English as a foreign language completed.    The patient is currently admitted and upon discharge could be taking Eliqius 5 mg.  Prior Authorization required  The patient is insured through [employer], [place] Patient Advocate Specialist [place] Team Direct Number: [phone]  Fax: [phone]

## 2021-02-06 NOTE — Progress Notes (Signed)
[name] Interim Progress Note  S:Patient resting in bed read, states he has no complaints.  O: BP (!) 103/52 (BP Location: Right Arm)   Pulse 64   Temp 98.3 F (36.8 C) (Oral)   Resp 19   Ht 5\' 7"  (1.702 m)   Wt 96.6 kg   SpO2 93%   BMI 33.34 kg/m    Gen: NAD, laying in bed  Resp: Chest rises symmetrically, no iWOB  A/P: Chest Pain  HFrEF EF 30-35%  Pericarditis  -f/u AM labs -per nephrology ok to continue low dose colchicine for now  AKI -AM BMP, urine studies -renal US nml RLQ transplant; no hydronephrosis, mass or peri transplant fluid seen -holding spiro,entresto,farxiga  Rest of plan per day team  Gerrit Heck, MD 02/06/2021, 11:36 PM PGY-1, [place] Medicine Service pager [phone]

## 2021-02-06 NOTE — Discharge Instructions (Addendum)
You were hospitalized at [facility] due to chest pain.  We expect this is from worsening heart failure and pericarditis (inflammation of the sac surrounding your heart) which improved after medication and monitoring.  We are so glad you are feeling better.  Be sure to follow-up with your regularly scheduled appointments.  Please also be sure to follow-up with your primary care doctor  along with any specialists you see outpatient at your earliest convenience. Please follow up with [facility] on 04/13/2021. Please take all your medications as prescribed. Do not take entresto, spironolactone and farxiga until your next follow up visit with the doctor. You may take torsemide 20 mg as needed daily and 1 potassium tablet 20 mEq as needed daily for heart failure symptoms as recommended by the heart failure doctor, Dr. Jeffie Pollock. Thank you for allowing [place] to be a part of your medical care.  Take care, [facility] family medicine team    Information on my medicine - ELIQUIS (apixaban)  This medication education was reviewed with me or my healthcare representative as part of my discharge preparation.    Why was Eliquis prescribed for you? Eliquis was prescribed for you to reduce the risk of a blood clot forming that can cause a stroke if you have a medical condition called atrial fibrillation (a type of irregular heartbeat).  What do You need to know about Eliquis ? Take your Eliquis TWICE DAILY - one tablet in the morning and one tablet in the evening with or without food. If you have difficulty swallowing the tablet whole please discuss with your pharmacist how to take the medication safely.  Take Eliquis exactly as prescribed by your doctor and DO NOT stop taking Eliquis without talking to the doctor who prescribed the medication.  Stopping may increase your risk of developing a stroke.  Refill your prescription before you run out.  After discharge, you should have regular check-up  appointments with your healthcare provider that is prescribing your Eliquis.  In the future your dose may need to be changed if your kidney function or weight changes by a significant amount or as you get older.  What do you do if you miss a dose? If you miss a dose, take it as soon as you remember on the same day and resume taking twice daily.  Do not take more than one dose of ELIQUIS at the same time to make up a missed dose.  Important Safety Information A possible side effect of Eliquis is bleeding. You should call your healthcare provider right away if you experience any of the following: Bleeding from an injury or your nose that does not stop. Unusual colored urine (red or dark brown) or unusual colored stools (red or black). Unusual bruising for unknown reasons. A serious fall or if you hit your head (even if there is no bleeding).  Some medicines may interact with Eliquis and might increase your risk of bleeding or clotting while on Eliquis. To help avoid this, consult your healthcare provider or pharmacist prior to using any new prescription or non-prescription medications, including herbals, vitamins, non-steroidal anti-inflammatory drugs (NSAIDs) and supplements.  This website has more information on Eliquis (apixaban): http://www.eliquis.com/eliquis/home  =================================  Atrial Flutter  Atrial flutter is a type of abnormal heart rhythm (arrhythmia). The heart has an electrical system that tells it how to beat. In atrial flutter, the signals move rapidly in the top chambers of the heart (the atria). This makes your heart beat very fast. Atrial  flutter can come and go, or it can be permanent. The goal of treatment is to prevent blood clots from forming, control your heart rate, or restore your heartbeat to a normal rhythm. If this condition is not treated, it can cause serious problems, such as a weakened heart muscle (cardiomyopathy) or a stroke.  What are  the causes? This condition is often caused by conditions that damage the heart's electrical system. These include: Heart conditions and heart surgery. These include heart attacks and open-heart surgery. Lung problems, such as COPD or a blood clot in the lung (pulmonary embolism, or PE). Poorly controlled high blood pressure (hypertension). Overactive thyroid (hyperthyroidism). Diabetes. In some cases, the cause of this condition is not known.  What increases the risk? You are more likely to develop this condition if: You are an elderly adult. You are a man. You are overweight (obese). You have obstructive sleep apnea. You have a family history of atrial flutter. You have diabetes. You drink a lot of alcohol, especially binge drinking. You use drugs, including cannabis. You smoke.  What are the signs or symptoms? Symptoms of this condition include: A feeling that your heart is pounding or racing (palpitations). Shortness of breath. Chest pain. Feeling dizzy or light-headed. Fainting. Low blood pressure (hypotension). Fatigue. Tiring easily during exercise or activity. In some cases, there are no symptoms.  How is this diagnosed? This condition may be diagnosed with: An electrocardiogram (ECG) to check electrical signals of the heart. An ambulatory cardiac monitor to record your heart's activity for a few days. An echocardiogram to create pictures of your heart. A transesophageal echocardiogram (TEE) to create even better pictures of your heart. A stress test to check your blood supply while you exercise. Imaging tests, such as a CT scan or chest X-ray. Blood tests.  How is this treated? Treatment depends on underlying conditions and how you feel when you experience atrial flutter. This condition may be treated with: Medicines to prevent blood clots or to treat heart rate or heart rhythm problems. Electrical cardioversion to reset the heart's rhythm. Ablation to remove  the heart tissue that sends abnormal signals. Left atrial appendage closure to seal the area where blood clots can form. In some cases, underlying conditions will be treated.  Follow these instructions at home:  Medicines Take over-the-counter and prescription medicines only as told by your health care provider. Do not take any new medicines without talking to your health care provider. If you are taking blood thinners: Talk with your health care provider before you take any medicines that contain aspirin or NSAIDs, such as ibuprofen. These medicines increase your risk for dangerous bleeding. Take your medicine exactly as told, at the same time every day. Avoid activities that could cause injury or bruising, and follow instructions about how to prevent falls. Wear a medical alert bracelet or carry a card that lists what medicines you take.  Lifestyle Eat heart-healthy foods. Talk with a dietitian to make an eating plan that is right for you. Do not use any products that contain nicotine or tobacco, such as cigarettes, e-cigarettes, and chewing tobacco. If you need help quitting, ask your health care provider. Do not drink alcohol. Do not use drugs, including cannabis. Lose weight if you are overweight or obese. Exercise regularly as instructed by your health care provider.  General instructions Do not use diet pills unless your health care provider approves. Diet pills may make heart problems worse. If you have obstructive sleep apnea, manage your  condition as told by your health care provider. Keep all follow-up visits as told by your health care provider. This is important.  Contact a health care provider if you: Notice a change in the rate, rhythm, or strength of your heartbeat. Are taking a blood thinner and you notice more bruising. Have a sudden change in weight. Tire more easily when you exercise or do heavy work.  Get help right away if you have: Pain or pressure in your  chest. Shortness of breath. Fainting. Increasing sweating with no known cause. Side effects of blood thinners, such as blood in your vomit, stool, or urine, or bleeding that cannot stop. Any symptoms of a stroke. "BE FAST" is an easy way to remember the main warning signs of a stroke: B - Balance. Signs are dizziness, sudden trouble walking, or loss of balance. E - Eyes. Signs are trouble seeing or a sudden change in vision. F - Face. Signs are sudden weakness or numbness of the face, or the face or eyelid drooping on one side. A - Arms. Signs are weakness or numbness in an arm. This happens suddenly and usually on one side of the body. S - Speech. Signs are sudden trouble speaking, slurred speech, or trouble understanding what people say. T - Time. Time to call emergency services. Write down what time symptoms started. Other signs of a stroke, such as: A sudden, severe headache with no known cause. Nausea or vomiting. Seizure. These symptoms may represent a serious problem that is an emergency. Do not wait to see if the symptoms will go away. Get medical help right away. Call your local emergency services (911 in the [place] Do not drive yourself to the hospital.  Summary Atrial flutter is an abnormal heart rhythm that can give you symptoms of palpitations, shortness of breath, or fatigue. Atrial flutter is often treated with medicines to keep your heart in a normal rhythm and to prevent a stroke. Get help right away if you cannot catch your breath, or have chest pain or pressure. Get help right away if you have signs or symptoms of a stroke. This information is not intended to replace advice given to you by your health care provider. Make sure you discuss any questions you have with your health care provider. Document Revised: 10/22/2018 Document Reviewed: 10/22/2018 Elsevier Patient Education  [place].

## 2021-02-06 NOTE — TOC Benefit Eligibility Note (Signed)
Patient Advocate Encounter  Prior Authorization for Eliquis 5 mg has been approved.    PA# [id number] Effective dates: 02/06/2021 through 02/06/2022  Patients co-pay is $20.00.     Lyndel Safe, [place] Patient Advocate Specialist [place] [facility] Stewardship Team Direct Number: [phone]  Fax: [phone]

## 2021-02-06 NOTE — Progress Notes (Addendum)
Family Medicine Teaching Service Daily Progress Note Intern Pager: [phone]  Patient name: Roy Dyer Medical record number: [id number] Date of birth: 1965-05-18 Age: 55 y.o. Gender: male  Primary Care Provider: [name], MD Consultants: Cardiology Code Status: Full  Pt Overview and Major Events to Date:  Pt admitted - 02/03/21  Assessment and Plan:  Roy Dyer is a 55 y.o. male presenting with chest pain and nausea. PMH is significant for severe premature CAD, ischemic CM/chronic systolic HF EF 10-27%, frequent PVCs, s/p AICD, SLE s/p renal transplant 2012, OSA on CPAP, HLD, HTN, prediabetes.     Chest Pain  HFrEF EF 30-35%  Pericarditis  VQ scan showed no evidence of pulmonary thromboembolism. Entresto cut in half yesterday for soft blood pressures. BP continues to be soft today, we will consider holding today.  We will continue to hold spironolactone for soft blood pressures. Chest pain improved today, mostly gone, patient feeling better.  -Hold Spironolactone today -Hold Entresto -Hold Farxiga 10 mg daily -Hold Bystolic 5 mg daily -Continue colchicine 0.6 mg daily? -Continue tramadol 100 mg twice daily  AKI Creatinine 2.04 today, (1.84, 1.86) baseline 1.4-1.6. BUN 33 (24, 22) Hold diuretics and nephrotoxic meds -Nephro consult, appreciate recs -500 cc Fluid Bolus -UA -Renal US -Continue colchicine x 1 daily ? -Hold Entresto, Farxiga, Bystolic, Spironolactone  Atrial Flutter  Frequent PVCs w/ AICD Positive for AT/AF episodes since last interrogation, exact onset unknown.  Now converted to NS on oral amiodarone.  -Follow-up cards recs -Continue Eliquis 5 mg daily -Continue clopidogrel 75 mg daily -Continue Amiodarone 200 mg daily  Lupus  S/p Renal Transplant  -Continue tacrolimus 0.5 mg -Continue mycophenolate 50 mg -Continue prednisone 5 mg  HLD -Continue Lipitor 80 mg -Hold niacin 500 mg for arrhythmia risk  HTN BP 90-100/60's today. Consider  stopping Entresto in setting of AKI and low BP  Hx of Prediabetes  significantly elevated glucose Glucose 115-95 today, stable  GERD -Continue Pepcid  OSA -CPAP qhs   FEN/GI: Heart Healthy Diet PPx: Eliquis Dispo:Home pending clinical improvement .   Subjective:  Patient reports improvement in pain, hardly noticeable this morning. Reports that he's sleeping well.   Objective: Temp:  [97.7 F (36.5 C)-98.4 F (36.9 C)] 98.1 F (36.7 C) (09/26 0446) Pulse Rate:  [58-68] 64 (09/26 0446) Resp:  [18] 18 (09/26 0446) BP: (98-109)/(48-69) 98/60 (09/26 0446) SpO2:  [92 %-97 %] 95 % (09/26 0446) Weight:  [96.6 kg] 96.6 kg (09/26 0446) Physical Exam: General: White male, no acute signs of distress, comfortable in bed Cardiovascular: Friction rub, RRR Respiratory: CTABL, no wheezes or stridor Abdomen: Soft, NT/ND, fluid wave Extremities: No edema, pulses intact on all extremities  Laboratory: Recent Labs  Lab 02/04/21 0640 02/05/21 0204 02/06/21 0123  WBC 14.2* 14.9* 14.4*  HGB 17.2* 15.7 16.1  HCT 52.1* 49.4 49.5  PLT 171 171 181   Recent Labs  Lab 02/03/21 1328 02/04/21 0543 02/05/21 0204 02/06/21 0123  NA 137 137 136 131*  K 4.9 4.7 4.4 4.0  CL 104 99 99 94*  CO2 26 25 26 26   BUN 20 22* 24* 33*  CREATININE 1.61* 1.86* 1.84* 2.04*  CALCIUM 9.1 9.4 9.0 8.9  PROT 5.6*  --   --   --   BILITOT 0.7  --   --   --   ALKPHOS 92  --   --   --   ALT 14  --   --   --  AST 17  --   --   --   GLUCOSE 192* 135* 115* 95     Imaging/Diagnostic Tests:   Holley Bouche, MD 02/06/2021, 7:02 AM PGY-1, [place] Intern pager: [phone], text pages welcome

## 2021-02-06 NOTE — Progress Notes (Signed)
[name] Brief Progress Note  S: Sitting up in bed resting. No concerns.    O: BP 109/67 (BP Location: Right Arm)   Pulse 65   Temp 98.4 F (36.9 C) (Oral)   Resp 18   Ht 5\' 7"  (1.702 m)   Wt 97.5 kg   SpO2 92%   BMI 33.67 kg/m   General: Appears well, no acute distress. Age appropriate. Respiratory: normal effort   A/P: Chest Pain  HFrEF EF 30-35%  Pericarditis  - Orders reviewed. Labs for AM ordered, which was adjusted as needed.   Gerlene Fee, DO 02/06/2021, 3:07 AM PGY-3,  Family Medicine Night Resident  Please page [phone] with questions.

## 2021-02-06 NOTE — Progress Notes (Addendum)
Advanced Heart Failure Rounding Note   Subjective:    Maintaining NSR. HR 60s.  PVCs well suppressed w/ amio and mexiletine. <5/hr overnight.   K 4.0   V/Q scan 9/25 negative for PE.   Feeling better today. Still w/ mild left posterior scapular pleuritic pain but overall much improved w/ colchicine.   Bump in SCr 1.84>>2.04. No lasix given yesterday. SBPs 98-109. Not eating much. Not much of an appetite. UOP not accurate. Strict I/Os not recorded.   Echo 02/04/21: EF 30-35% No effusion   Objective:   Weight Range:  Vital Signs:   Temp:  [98.1 F (36.7 C)-98.4 F (36.9 C)] 98.1 F (36.7 C) (09/26 0446) Pulse Rate:  [58-68] 64 (09/26 0446) Resp:  [18] 18 (09/26 0446) BP: (98-109)/(60-69) 98/60 (09/26 0446) SpO2:  [92 %-97 %] 95 % (09/26 0446) Weight:  [96.6 kg] 96.6 kg (09/26 0446) Last BM Date: 02/03/21  Weight change: Filed Weights   02/03/21 1216 02/06/21 0446  Weight: 97.5 kg 96.6 kg    Intake/Output:   Intake/Output Summary (Last 24 hours) at 02/06/2021 0809 Last data filed at 02/06/2021 0450 Gross per 24 hour  Intake 925.12 ml  Output 275 ml  Net 650.12 ml     PHYSICAL EXAM: General:  Well appearing. No respiratory difficulty HEENT: normal Neck: supple. no JVD. Carotids 2+ bilat; no bruits. No lymphadenopathy or thyromegaly appreciated. Cor: PMI nondisplaced. Regular rate & rhythm. + friction rub LSB. No gallops or murmurs. Lungs: clear Abdomen: soft, nontender, nondistended. No hepatosplenomegaly. No bruits or masses. Good bowel sounds. Extremities: no cyanosis, clubbing, rash, edema Neuro: alert & oriented x 3, cranial nerves grossly intact. moves all 4 extremities w/o difficulty. Affect pleasant.   Telemetry: NSR 60s. Less PVCs, < 5/hr overnight. Personally reviewed   Labs: Basic Metabolic Panel: Recent Labs  Lab 02/03/21 1315 02/03/21 1328 02/03/21 1328 02/04/21 0543 02/04/21 0640 02/05/21 0204 02/06/21 0123  NA 138 137  --  137  --   136 131*  K 4.8 4.9  --  4.7  --  4.4 4.0  CL 102 104  --  99  --  99 94*  CO2  --  26  --  25  --  26 26  GLUCOSE 185* 192*  --  135*  --  115* 95  BUN 22* 20  --  22*  --  24* 33*  CREATININE 1.60* 1.61*  --  1.86*  --  1.84* 2.04*  CALCIUM  --  9.1   < > 9.4  --  9.0 8.9  MG  --  1.7  --   --  2.2  --   --    < > = values in this interval not displayed.    Liver Function Tests: Recent Labs  Lab 02/03/21 1328  AST 17  ALT 14  ALKPHOS 92  BILITOT 0.7  PROT 5.6*  ALBUMIN 3.5   Recent Labs  Lab 02/03/21 1328  LIPASE 28   No results for input(s): AMMONIA in the last 168 hours.  CBC: Recent Labs  Lab 02/03/21 1315 02/03/21 1328 02/04/21 0640 02/05/21 0204 02/06/21 0123  WBC  --  12.5* 14.2* 14.9* 14.4*  HGB 17.7* 16.9 17.2* 15.7 16.1  HCT 52.0 53.0* 52.1* 49.4 49.5  MCV  --  92.7 89.4 90.0 90.5  PLT  --  170 171 171 181    Cardiac Enzymes: No results for input(s): CKTOTAL, CKMB, CKMBINDEX, TROPONINI in the last 168 hours.  BNP: BNP (last 3 results) Recent Labs    05/19/20 0958 09/07/20 1047 02/03/21 1329  BNP 1,456.0* 792.8* 1,441.7*    ProBNP (last 3 results) No results for input(s): PROBNP in the last 8760 hours.    Other results:  Imaging: DG Chest 1 View  Result Date: 02/05/2021 CLINICAL DATA:  Chest pain. EXAM: CHEST  1 VIEW COMPARISON:  02/04/2021 FINDINGS: Cardiac silhouette is mildly enlarged. Stable right anterior chest wall AICD. Clear lungs.  No pleural effusion or pneumothorax. Skeletal structures are grossly intact. IMPRESSION: No acute cardiopulmonary disease. Electronically Signed   By: Lajean Manes M.D.   On: 02/05/2021 14:35   NM Pulmonary Perfusion  Result Date: 02/05/2021 CLINICAL DATA:  Hx of CAD, AICD, Combined CHF, CKD III S/P renal transplant, HTN, HLD, Lupus, OSA o CPAP, PreDM presented with B/L chest pain radiating to his neck and back with a dry cough that is intermittent. EXAM: NUCLEAR MEDICINE PERFUSION LUNG SCAN  TECHNIQUE: Perfusion images were obtained in multiple projections after intravenous injection of radiopharmaceutical. Ventilation scans intentionally deferred if perfusion scan and chest x-ray adequate for interpretation during COVID 19 epidemic. RADIOPHARMACEUTICALS:  3.75 mCi Tc-58m MAA IV COMPARISON:  Current chest radiograph FINDINGS: No segmental perfusion defects to suggest pulmonary thromboemboli. Focal defect overlies the peripheral right lung from the patient's anterior chest wall AICD. Patchy relative decreased perfusion to the posterior aspect of the right upper lobe and apical segment of the right lower lobe. IMPRESSION: No evidence of a pulmonary thromboembolism. Electronically Signed   By: Lajean Manes M.D.   On: 02/05/2021 14:38   ECHOCARDIOGRAM COMPLETE  Result Date: 02/04/2021    ECHOCARDIOGRAM REPORT   Patient Name:   LEVANDER KATZENSTEIN Date of Exam: 02/04/2021 Medical Rec #:  [id number]    Height:       67.0 in Accession #:    [id number]   Weight:       215.0 lb Date of Birth:  1965/11/23    BSA:          2.085 m Patient Age:    55 years     BP:           116/80 mmHg Patient Gender: M            HR:           74 bpm. Exam Location:  Inpatient Procedure: 2D Echo, Cardiac Doppler and Color Doppler Indications:    CHF  History:        Patient has prior history of Echocardiogram examinations, most                 recent 10/13/2020. CHF and Cardiomyopathy, Previous Myocardial                 Infarction and CAD, Defibrillator, Arrythmias:Atrial Flutter;                 Signs/Symptoms:Chest Pain. Post op renal transplant.  Sonographer:    Dustin Flock RDCS Referring Phys: [id number] Anvith Mauriello R Keagan Anthis IMPRESSIONS  1. Left ventricular ejection fraction, by estimation, is 30 to 35%. The left ventricle has moderately decreased function. The left ventricle demonstrates global hypokinesis. The left ventricular internal cavity size was moderately dilated. There is mild  left ventricular hypertrophy. Left ventricular  diastolic parameters are consistent with Grade II diastolic dysfunction (pseudonormalization).  2. Right ventricular systolic function is normal. The right ventricular size is normal. There is moderately elevated pulmonary artery systolic pressure. The estimated right ventricular systolic  pressure is 56.0 mmHg.  3. Left atrial size was severely dilated.  4. Right atrial size was severely dilated.  5. The mitral valve is normal in structure. Trivial mitral valve regurgitation. No evidence of mitral stenosis.  6. Tricuspid valve regurgitation is moderate.  7. The aortic valve is normal in structure. Aortic valve regurgitation is not visualized. Mild aortic valve sclerosis is present, with no evidence of aortic valve stenosis.  8. The inferior vena cava is dilated in size with <50% respiratory variability, suggesting right atrial pressure of 15 mmHg. Comparison(s): No significant change from prior study. Prior images reviewed side by side. FINDINGS  Left Ventricle: Left ventricular ejection fraction, by estimation, is 30 to 35%. The left ventricle has moderately decreased function. The left ventricle demonstrates global hypokinesis. The left ventricular internal cavity size was moderately dilated. There is mild left ventricular hypertrophy. Left ventricular diastolic parameters are consistent with Grade II diastolic dysfunction (pseudonormalization). Right Ventricle: The right ventricular size is normal. No increase in right ventricular wall thickness. Right ventricular systolic function is normal. There is moderately elevated pulmonary artery systolic pressure. The tricuspid regurgitant velocity is 3.20 m/s, and with an assumed right atrial pressure of 15 mmHg, the estimated right ventricular systolic pressure is 42.5 mmHg. Left Atrium: Left atrial size was severely dilated. Right Atrium: Right atrial size was severely dilated. Pericardium: There is no evidence of pericardial effusion. Mitral Valve: The mitral valve  is normal in structure. Trivial mitral valve regurgitation. No evidence of mitral valve stenosis. Tricuspid Valve: The tricuspid valve is normal in structure. Tricuspid valve regurgitation is moderate . No evidence of tricuspid stenosis. Aortic Valve: The aortic valve is normal in structure. Aortic valve regurgitation is not visualized. Mild aortic valve sclerosis is present, with no evidence of aortic valve stenosis. Pulmonic Valve: The pulmonic valve was normal in structure. Pulmonic valve regurgitation is not visualized. No evidence of pulmonic stenosis. Aorta: The aortic root is normal in size and structure. Venous: The inferior vena cava is dilated in size with less than 50% respiratory variability, suggesting right atrial pressure of 15 mmHg. IAS/Shunts: No atrial level shunt detected by color flow Doppler. Additional Comments: A device lead is visualized in the right ventricle.  LEFT VENTRICLE PLAX 2D LVIDd:         6.60 cm      Diastology LVIDs:         5.60 cm      LV e' medial:    7.83 cm/s LV PW:         1.30 cm      LV E/e' medial:  11.2 LV IVS:        1.40 cm      LV e' lateral:   9.25 cm/s LVOT diam:     2.40 cm      LV E/e' lateral: 9.5 LV SV:         93 LV SV Index:   45 LVOT Area:     4.52 cm  LV Volumes (MOD) LV vol d, MOD A4C: 216.0 ml LV vol s, MOD A4C: 134.0 ml LV SV MOD A4C:     216.0 ml RIGHT VENTRICLE RV Basal diam:  3.50 cm RV S prime:     12.20 cm/s TAPSE (M-mode): 2.8 cm LEFT ATRIUM              Index       RIGHT ATRIUM           Index LA diam:  5.50 cm  2.64 cm/m  RA Area:     20.30 cm LA Vol (A2C):   101.0 ml 48.43 ml/m RA Volume:   59.90 ml  28.72 ml/m LA Vol (A4C):   100.0 ml 47.95 ml/m LA Biplane Vol: 105.0 ml 50.35 ml/m  AORTIC VALVE LVOT Vmax:   111.00 cm/s LVOT Vmean:  73.700 cm/s LVOT VTI:    0.206 m  AORTA Ao Root diam: 3.30 cm MITRAL VALVE               TRICUSPID VALVE MV Area (PHT): 5.97 cm    TR Peak grad:   41.0 mmHg MV Decel Time: 127 msec    TR Vmax:         320.00 cm/s MV E velocity: 87.50 cm/s MV A velocity: 30.00 cm/s  SHUNTS MV E/A ratio:  2.92        Systemic VTI:  0.21 m                            Systemic Diam: 2.40 cm Candee Furbish MD Electronically signed by Candee Furbish MD Signature Date/Time: 02/04/2021/3:22:25 PM    Final      Medications:     Scheduled Medications:  amiodarone  200 mg Oral BID   apixaban  5 mg Oral BID   atorvastatin  80 mg Oral QHS   clopidogrel  75 mg Oral Daily   colchicine  0.6 mg Oral Daily   dapagliflozin propanediol  10 mg Oral QAC breakfast   famotidine  40 mg Oral QHS   mexiletine  300 mg Oral BID   mycophenolate  750 mg Oral BID   nebivolol  5 mg Oral Daily   predniSONE  5 mg Oral Daily   sacubitril-valsartan  1 tablet Oral BID   spironolactone  12.5 mg Oral Daily   tacrolimus  0.5 mg Oral Q12H   traMADol  100 mg Oral Q12H    Infusions:  sodium chloride Stopped (02/03/21 2329)    PRN Medications:    Assessment&Plan:   1. New onset atrial flutter: -Multiple AT/AF episodes since last interrogation. Exact onset not known.  -Remains in NSR, HR 60s  -Continue PO amio 200 mg bid  -Continue Eliquis 5 mg BID -Echo 02/04/21: EF 30-35% No effusion    2. Pleuritic chest pain: - V/Q scan negative for PE  -? Pericarditis given symptoms and recent vaccine. Treating empirically with colchicine 0.6 mg BID. No NSAIDs as he is post renal transplant  - No effusion on echo - Can use colchicine 0.6 bid (per study guidelines for acute pericarditis) for several days with SCr 1.8. Risk of short-term adverse events is very low   3. Acute on chronic systolic heart failure/iCM - HX ICD in 2012 - Echo 12/17 EF 30%  - Cath 04/22/20 for worsening HF symptoms. Stable CAD. - Echo 6/22 EF 30-35%, Grade III DD, severe LAE, mild/mod TR - Echo 02/04/21: EF 30-35% No effusion  - Volume overloaded on admit by device interrogation. Volume status improved w/ IV Lasix  - Hold Arlyce Harman and [employer] today w/ bump in SCr  1.8>>2.0 - May need to hold Farxiga (dose already given today)   - Hold bystolic. Need to avoid hypotension given AKI    3.  CAD with prior MI x2 and ischemic cardiomyopathy - Multiple stents to LAD, RCA and LCX - Cath 04/22/20 for worsening HF symptoms with stable disease - HS troponin flat  Does not appear to be ACS. ECG nonischemic. - No ASA given Eliquis  - continue Plavix  - On statin and beta blocker.   4.  AKI on CKD 3a s/p renal transplant in 2012 (due to SLE): - Follows with nephrology, Dr. Clover Mealy and Dr. Lissa Merlin at [facility]. - Baseline Creatinine 1.4-1.6 - SCr up, 1.8>>2.0  - Hold diuretics, Entresto and Spiro  - Avoid hypotension. May need to give some fluid back  - Continue prednisone, Prograf and CellCept.   5. Frequent PVCs - Zio 12/21 13.4% PVC burden on Zio with runs of SVT & NSVT. - PVCs reduced on mexilitene 300 mg bid.  - Zio (5/22): PVC 11.6 % burden - Zio (8/22): PVC 11.8% burden - Better suppressed w/ amio and mexiletine. <5/hr overnight    Length of Stay: 3   Brittainy Simmons PA-C  02/06/2021, 8:09 AM  Advanced Heart Failure Team Pager [phone] (M-F; 7a - 4p)  Please contact [facility] for night-coverage after hours (4p -7a ) and weekends on [email or web address]   Patient seen and examined with the above-signed Advanced Practice Provider and/or Housestaff. I personally reviewed laboratory data, imaging studies and relevant notes. I independently examined the patient and formulated the important aspects of the plan. I have edited the note to reflect any of my changes or salient points. I have personally discussed the plan with the patient and/or family.  Remains weak. CP improving. VQ reviewed and is negative for PE. Remains in NSR on po amio. On eliquis for [facility]. SCr bumed 1.8 -> 2.1  General:  Weak appearing. No resp difficulty HEENT: normal Neck: supple. no JVD. Carotids 2+ bilat; no bruits. No lymphadenopathy or thryomegaly appreciated. Cor: PMI  nondisplaced. Regular rate & rhythm. No rubs, gallops or murmurs. Lungs: clear Abdomen: soft, nontender, nondistended. No hepatosplenomegaly. No bruits or masses. Good bowel sounds. Extremities: no cyanosis, clubbing, rash, edema Neuro: alert & orientedx3, cranial nerves grossly intact. moves all 4 extremities w/o difficulty. Affect pleasant  Remains in NSR. CP improving. Has some AKI in transplanted kidney. Will give 500cc NS. Hold Farxiga and bystolic.    [name], MD  9:28 AM

## 2021-02-06 NOTE — TOC Benefit Eligibility Note (Signed)
Patient Teacher, English as a foreign language completed.    The patient is currently admitted and upon discharge could be taking Colchicine 0.6 mg tab.  The current 30 day co-pay is, $10.00.   The patient is insured through [place], [place] Patient Advocate Specialist [place] Team Direct Number: [phone]  Fax: [phone]

## 2021-02-07 DIAGNOSIS — I483 Typical atrial flutter: Secondary | ICD-10-CM | POA: Diagnosis not present

## 2021-02-07 DIAGNOSIS — M3212 Pericarditis in systemic lupus erythematosus: Secondary | ICD-10-CM

## 2021-02-07 DIAGNOSIS — T8619 Other complication of kidney transplant: Secondary | ICD-10-CM

## 2021-02-07 DIAGNOSIS — R071 Chest pain on breathing: Secondary | ICD-10-CM | POA: Diagnosis not present

## 2021-02-07 DIAGNOSIS — N179 Acute kidney failure, unspecified: Secondary | ICD-10-CM

## 2021-02-07 DIAGNOSIS — I5023 Acute on chronic systolic (congestive) heart failure: Secondary | ICD-10-CM | POA: Diagnosis not present

## 2021-02-07 LAB — CBC
HCT: 43.5 % (ref 39.0–52.0)
Hemoglobin: 14.2 g/dL (ref 13.0–17.0)
MCH: 28.9 pg (ref 26.0–34.0)
MCHC: 32.6 g/dL (ref 30.0–36.0)
MCV: 88.4 fL (ref 80.0–100.0)
Platelets: 176 10*3/uL (ref 150–400)
RBC: 4.92 MIL/uL (ref 4.22–5.81)
RDW: 13.7 % (ref 11.5–15.5)
WBC: 9.4 10*3/uL (ref 4.0–10.5)
nRBC: 0 % (ref 0.0–0.2)

## 2021-02-07 LAB — URINALYSIS, ROUTINE W REFLEX MICROSCOPIC
Bacteria, UA: NONE SEEN
Bilirubin Urine: NEGATIVE
Glucose, UA: 150 mg/dL — AB
Ketones, ur: NEGATIVE mg/dL
Leukocytes,Ua: NEGATIVE
Nitrite: NEGATIVE
Protein, ur: NEGATIVE mg/dL
Specific Gravity, Urine: 1.008 (ref 1.005–1.030)
pH: 5 (ref 5.0–8.0)

## 2021-02-07 LAB — BASIC METABOLIC PANEL
Anion gap: 9 (ref 5–15)
BUN: 35 mg/dL — ABNORMAL HIGH (ref 6–20)
CO2: 28 mmol/L (ref 22–32)
Calcium: 8.7 mg/dL — ABNORMAL LOW (ref 8.9–10.3)
Chloride: 96 mmol/L — ABNORMAL LOW (ref 98–111)
Creatinine, Ser: 2.04 mg/dL — ABNORMAL HIGH (ref 0.61–1.24)
GFR, Estimated: 38 mL/min — ABNORMAL LOW (ref >60)
Glucose, Bld: 106 mg/dL — ABNORMAL HIGH (ref 70–99)
Potassium: 3.8 mmol/L (ref 3.5–5.1)
Sodium: 133 mmol/L — ABNORMAL LOW (ref 135–145)

## 2021-02-07 LAB — C4 COMPLEMENT: Complement C4, Body Fluid: 19 mg/dL (ref 12–38)

## 2021-02-07 LAB — C3 COMPLEMENT: C3 Complement: 114 mg/dL (ref 82–167)

## 2021-02-07 LAB — ANTI-DNA ANTIBODY, DOUBLE-STRANDED: ds DNA Ab: 1 IU/mL (ref 0–9)

## 2021-02-07 LAB — SODIUM, URINE, RANDOM: Sodium, Ur: <10 mmol/L

## 2021-02-07 LAB — CREATININE, URINE, RANDOM: Creatinine, Urine: 63.67 mg/dL

## 2021-02-07 NOTE — Progress Notes (Signed)
[facility] Progress Note   55 y.o. 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 Pain was improved when sitting up. He has a dry intermittent cough and was treated for CHF with a presenting BNP of 1400 with Lasix 80mg  IV x1; he sees Dr. Haroldine Laws for his heart failure and EDW is 212-213 lbs. His baseline creatinine is 1.4-1.6. Patient started on Colchicine 0.6mg  qd for pericarditis.  He is on Cellcept 750mg  BID, Prednisone 5mg  and Prograf 0.5mg  q12hrs.   Assessment/ Plan:   Acute renal failure on CKD 3a w/ BL cr 1.4-1.6 followed by Dr. Moshe Cipro and Dr. Esaw Dace @ [place] transplant. Unlikely to be flare given h/o ESRD + on Cellcept post transplant. Other DDx are prerenal azotemia (has not had a good appetite and appears to be relatively euvolemic and certainly not in florid CHF) vs proximal tubulopathy from the Colchicine. - Sending serologies but low suspicion for lupus flare. Urine microscopy and urine studies not suggestive of flare either. - Agree with gentle hydration + holding spironlactone, entresto and [place]. - Continue immunosuppresives and will check a tac trough as well.  - Ok to continue the Colchicine for now (already on low dose); no e/o  proximal tubulopathy. Would not increase Colchicine to treatment dose given risk of myopathy with Prograf.  Pleuritic chest pain thought to be secondary to pericarditis - improving already. CHF relatively well compensated.  Subjective:   Denies f/c/n/v/ dyspnea.   Objective:   BP (!) 109/58 (BP Location: Right Arm)   Pulse (!) 59   Temp 97.7 F (36.5 C) (Oral)   Resp 18   Ht 5\' 7"  (1.702 m)   Wt 96.6 kg   SpO2 95%   BMI 33.34 kg/m   Intake/Output Summary (Last 24 hours) at  02/07/2021 0846 Last data filed at 02/07/2021 0800 Gross per 24 hour  Intake 960 ml  Output 250 ml  Net 710 ml   Weight change:   Physical Exam: GEN: NAD, A&Ox3, NCAT HEENT: No conjunctival pallor, EOMI NECK: Supple, no thyromegaly, no JVD LUNGS: CTA B/L no rales, rhonchi or wheezing CV: RRR, rub present, no S3 or S4 ABD: SNDNT +BS, no tenderness over transplant in the RLQ EXT: No lower extremity edema  Imaging: DG Chest 1 View  Result Date: 02/05/2021 CLINICAL DATA:  Chest pain. EXAM: CHEST  1 VIEW COMPARISON:  02/04/2021 FINDINGS: Cardiac silhouette is mildly enlarged. Stable right anterior chest wall AICD. Clear lungs.  No pleural effusion or pneumothorax. Skeletal structures are grossly intact. IMPRESSION: No acute cardiopulmonary disease. Electronically Signed   By: Lajean Manes M.D.   On: 02/05/2021 14:35   NM Pulmonary Perfusion  Result Date: 02/05/2021 CLINICAL DATA:  Hx of CAD, AICD, Combined CHF, CKD III S/P renal transplant, HTN, HLD, Lupus, OSA o CPAP, PreDM presented with B/L chest pain radiating to his neck and back with a dry cough that is intermittent. EXAM: NUCLEAR MEDICINE PERFUSION LUNG SCAN TECHNIQUE: Perfusion images were obtained in multiple projections after intravenous injection of radiopharmaceutical. Ventilation scans intentionally deferred if perfusion scan and chest x-ray adequate for interpretation during COVID 19 epidemic. RADIOPHARMACEUTICALS:  3.75 mCi Tc-34m MAA IV COMPARISON:  Current chest radiograph FINDINGS: No segmental perfusion defects to suggest pulmonary thromboemboli. Focal defect  overlies the peripheral right lung from the patient's anterior chest wall AICD. Patchy relative decreased perfusion to the posterior aspect of the right upper lobe and apical segment of the right lower lobe. IMPRESSION: No evidence of a pulmonary thromboembolism. Electronically Signed   By: Lajean Manes M.D.   On: 02/05/2021 14:38   US Renal Transplant  w/Doppler  Result Date: 02/06/2021 CLINICAL DATA:  Renal transplant 8 years ago, elevated creatinine EXAM: ULTRASOUND OF RENAL TRANSPLANT WITH RENAL DOPPLER ULTRASOUND TECHNIQUE: Ultrasound examination of the renal transplant was performed with gray-scale, color and duplex doppler evaluation. COMPARISON:  None. FINDINGS: Transplant kidney location: RLQ Transplant Kidney: Renal measurements: 10.7 x 6.2 x 6.6 cm = volume: 226.9mL. Normal in size and parenchymal echogenicity. No evidence of mass or hydronephrosis. No peri-transplant fluid collection seen. Color flow in the main renal artery:  Yes Color flow in the main renal vein:  Yes Duplex Doppler Evaluation: Main Renal Artery Velocity: 141 cm/sec Main Renal Artery Resistive Index: 0.9 Venous waveform in main renal vein:  Present Intrarenal resistive index in upper pole:  0.6 (normal 0.6-0.8; equivocal 0.8-0.9; abnormal >= 0.9) Intrarenal resistive index in lower pole: 0.7 (normal 0.6-0.8; equivocal 0.8-0.9; abnormal >= 0.9) Bladder: Normal for degree of bladder distention. Other findings:  None. IMPRESSION: 1. Unremarkable right lower quadrant renal transplant. Electronically Signed   By: Randa Ngo M.D.   On: 02/06/2021 20:00    Labs: BMET Recent Labs  Lab 02/03/21 1315 02/03/21 1328 02/04/21 0543 02/05/21 0204 02/06/21 0123 02/06/21 1737 02/07/21 0424  NA 138 137 137 136 131*  --  133*  K 4.8 4.9 4.7 4.4 4.0  --  3.8  CL 102 104 99 99 94*  --  96*  CO2  --  26 25 26 26   --  28  GLUCOSE 185* 192* 135* 115* 95  --  106*  BUN 22* 20 22* 24* 33*  --  35*  CREATININE 1.60* 1.61* 1.86* 1.84* 2.04*  --  2.04*  CALCIUM  --  9.1 9.4 9.0 8.9  --  8.7*  PHOS  --   --   --   --   --  3.8  --    CBC Recent Labs  Lab 02/04/21 0640 02/05/21 0204 02/06/21 0123 02/07/21 0424  WBC 14.2* 14.9* 14.4* 9.4  HGB 17.2* 15.7 16.1 14.2  HCT 52.1* 49.4 49.5 43.5  MCV 89.4 90.0 90.5 88.4  PLT 171 171 181 176    Medications:     amiodarone  200 mg  Oral BID   apixaban  5 mg Oral BID   atorvastatin  80 mg Oral QHS   clopidogrel  75 mg Oral Daily   colchicine  0.6 mg Oral Daily   famotidine  40 mg Oral QHS   mexiletine  300 mg Oral BID   mycophenolate  750 mg Oral BID   predniSONE  5 mg Oral Daily   tacrolimus  0.5 mg Oral Q12H   traMADol  100 mg Oral Q12H      Otelia Santee, MD 02/07/2021, 8:46 AM

## 2021-02-07 NOTE — Progress Notes (Addendum)
Advanced Heart Failure Rounding Note   Subjective:    NSR/SB on tele. HR upper 50s-low 60s. Very few PVCs <4/hr overnight.   No dyspnea. Less pleuritic chest pain.   SCr remains elevated from baseline but stable past 24 hrs, 1.84>>2.04>2.04. Got 500 cc fluid bolus yesterday and diuretics held. Wt not checked yet. Nephrology now following and checking serologies (in process). UA unremarkable.   SBPs low 100s-110s.   Still feels a bit weak. Appetite still not great but improving some.   Echo 02/04/21: EF 30-35% No effusion   Objective:   Weight Range:  Vital Signs:   Temp:  [97.5 F (36.4 C)-98.3 F (36.8 C)] 97.7 F (36.5 C) (09/27 0730) Pulse Rate:  [59-65] 59 (09/27 0730) Resp:  [17-20] 18 (09/27 0730) BP: (103-111)/(52-69) 109/58 (09/27 0730) SpO2:  [93 %-97 %] 95 % (09/27 0730) Last BM Date: 02/06/21  Weight change: Filed Weights   02/03/21 1216 02/06/21 0446  Weight: 97.5 kg 96.6 kg    Intake/Output:   Intake/Output Summary (Last 24 hours) at 02/07/2021 0739 Last data filed at 02/06/2021 1800 Gross per 24 hour  Intake 720 ml  Output --  Net 720 ml     PHYSICAL EXAM: General:  fatigued appearing middle aged WM sitting up in bed No respiratory difficulty HEENT: normal Neck: supple. no JVD. Carotids 2+ bilat; no bruits. No lymphadenopathy or thyromegaly appreciated. Cor: PMI nondisplaced. Regular rate & rhythm. No rubs, gallops or murmurs. Lungs: clear Abdomen: soft, nontender, nondistended. No hepatosplenomegaly. No bruits or masses. Good bowel sounds. Extremities: no cyanosis, clubbing, rash, edema Neuro: alert & oriented x 3, cranial nerves grossly intact. moves all 4 extremities w/o difficulty. Affect pleasant.    Telemetry: NSR/SB upper 50s-low 60s, < 4 PVCs/hr overnight Personally reviewed   Labs: Basic Metabolic Panel: Recent Labs  Lab 02/03/21 1328 02/04/21 0543 02/04/21 0640 02/05/21 0204 02/06/21 0123 02/06/21 1737 02/07/21 0424   NA 137 137  --  136 131*  --  133*  K 4.9 4.7  --  4.4 4.0  --  3.8  CL 104 99  --  99 94*  --  96*  CO2 26 25  --  26 26  --  28  GLUCOSE 192* 135*  --  115* 95  --  106*  BUN 20 22*  --  24* 33*  --  35*  CREATININE 1.61* 1.86*  --  1.84* 2.04*  --  2.04*  CALCIUM 9.1 9.4  --  9.0 8.9  --  8.7*  MG 1.7  --  2.2  --   --  2.3  --   PHOS  --   --   --   --   --  3.8  --     Liver Function Tests: Recent Labs  Lab 02/03/21 1328  AST 17  ALT 14  ALKPHOS 92  BILITOT 0.7  PROT 5.6*  ALBUMIN 3.5   Recent Labs  Lab 02/03/21 1328  LIPASE 28   No results for input(s): AMMONIA in the last 168 hours.  CBC: Recent Labs  Lab 02/03/21 1328 02/04/21 0640 02/05/21 0204 02/06/21 0123 02/07/21 0424  WBC 12.5* 14.2* 14.9* 14.4* 9.4  HGB 16.9 17.2* 15.7 16.1 14.2  HCT 53.0* 52.1* 49.4 49.5 43.5  MCV 92.7 89.4 90.0 90.5 88.4  PLT 170 171 171 181 176    Cardiac Enzymes: No results for input(s): CKTOTAL, CKMB, CKMBINDEX, TROPONINI in the last 168 hours.  BNP: BNP (last  3 results) Recent Labs    05/19/20 0958 09/07/20 1047 02/03/21 1329  BNP 1,456.0* 792.8* 1,441.7*    ProBNP (last 3 results) No results for input(s): PROBNP in the last 8760 hours.    Other results:  Imaging: DG Chest 1 View  Result Date: 02/05/2021 CLINICAL DATA:  Chest pain. EXAM: CHEST  1 VIEW COMPARISON:  02/04/2021 FINDINGS: Cardiac silhouette is mildly enlarged. Stable right anterior chest wall AICD. Clear lungs.  No pleural effusion or pneumothorax. Skeletal structures are grossly intact. IMPRESSION: No acute cardiopulmonary disease. Electronically Signed   By: Lajean Manes M.D.   On: 02/05/2021 14:35   NM Pulmonary Perfusion  Result Date: 02/05/2021 CLINICAL DATA:  Hx of CAD, AICD, Combined CHF, CKD III S/P renal transplant, HTN, HLD, Lupus, OSA o CPAP, PreDM presented with B/L chest pain radiating to his neck and back with a dry cough that is intermittent. EXAM: NUCLEAR MEDICINE PERFUSION  LUNG SCAN TECHNIQUE: Perfusion images were obtained in multiple projections after intravenous injection of radiopharmaceutical. Ventilation scans intentionally deferred if perfusion scan and chest x-ray adequate for interpretation during COVID 19 epidemic. RADIOPHARMACEUTICALS:  3.75 mCi Tc-13m MAA IV COMPARISON:  Current chest radiograph FINDINGS: No segmental perfusion defects to suggest pulmonary thromboemboli. Focal defect overlies the peripheral right lung from the patient's anterior chest wall AICD. Patchy relative decreased perfusion to the posterior aspect of the right upper lobe and apical segment of the right lower lobe. IMPRESSION: No evidence of a pulmonary thromboembolism. Electronically Signed   By: Lajean Manes M.D.   On: 02/05/2021 14:38   US Renal Transplant w/Doppler  Result Date: 02/06/2021 CLINICAL DATA:  Renal transplant 8 years ago, elevated creatinine EXAM: ULTRASOUND OF RENAL TRANSPLANT WITH RENAL DOPPLER ULTRASOUND TECHNIQUE: Ultrasound examination of the renal transplant was performed with gray-scale, color and duplex doppler evaluation. COMPARISON:  None. FINDINGS: Transplant kidney location: RLQ Transplant Kidney: Renal measurements: 10.7 x 6.2 x 6.6 cm = volume: 226.66mL. Normal in size and parenchymal echogenicity. No evidence of mass or hydronephrosis. No peri-transplant fluid collection seen. Color flow in the main renal artery:  Yes Color flow in the main renal vein:  Yes Duplex Doppler Evaluation: Main Renal Artery Velocity: 141 cm/sec Main Renal Artery Resistive Index: 0.9 Venous waveform in main renal vein:  Present Intrarenal resistive index in upper pole:  0.6 (normal 0.6-0.8; equivocal 0.8-0.9; abnormal >= 0.9) Intrarenal resistive index in lower pole: 0.7 (normal 0.6-0.8; equivocal 0.8-0.9; abnormal >= 0.9) Bladder: Normal for degree of bladder distention. Other findings:  None. IMPRESSION: 1. Unremarkable right lower quadrant renal transplant. Electronically Signed   By:  Randa Ngo M.D.   On: 02/06/2021 20:00     Medications:     Scheduled Medications:  amiodarone  200 mg Oral BID   apixaban  5 mg Oral BID   atorvastatin  80 mg Oral QHS   clopidogrel  75 mg Oral Daily   colchicine  0.6 mg Oral Daily   famotidine  40 mg Oral QHS   mexiletine  300 mg Oral BID   mycophenolate  750 mg Oral BID   predniSONE  5 mg Oral Daily   tacrolimus  0.5 mg Oral Q12H   traMADol  100 mg Oral Q12H    Infusions:  sodium chloride Stopped (02/03/21 2329)    PRN Medications:    Assessment&Plan:   1. New onset atrial flutter: -Multiple AT/AF episodes since last interrogation. Exact onset not known.  -Remains in NSR/SB, HR 50s-60s  -Continue PO amio  200 mg bid  -Continue Eliquis 5 mg BID -Holding ? blocker for now w/ soft BP and bradycardia  -Echo 02/04/21: EF 30-35% No effusion    2. Pleuritic chest pain: - V/Q scan negative for PE  -? Pericarditis given symptoms and recent vaccine. Treating empirically with colchicine 0.6 mg BID. No NSAIDs as he is post renal transplant  - No effusion on echo - Can use colchicine 0.6 bid (per study guidelines for acute pericarditis) for several days w/ CKD. Risk of short-term adverse events is very low. Nephrology also following and ok w/ continuing colchicine.   3. Acute on chronic systolic heart failure/iCM - HX ICD in 2012 - Echo 12/17 EF 30%  - Cath 04/22/20 for worsening HF symptoms. Stable CAD. - Echo 6/22 EF 30-35%, Grade III DD, severe LAE, mild/mod TR - Echo 02/04/21: EF 30-35% No effusion  - Volume overloaded on admit by device interrogation. Volume status improved w/ IV Lasix  - ? Overdiuresis w/ AKI. 500 cc bolus given yesterday. Hold lasix - Hold Mearl Latin and Farxiga w/ AKI  - Hold bystolic. Need to avoid hypotension given AKI  - monitor daily wts and strict I/Os    3.  CAD with prior MI x2 and ischemic cardiomyopathy - Multiple stents to LAD, RCA and LCX - Cath 04/22/20 for worsening HF  symptoms with stable disease - HS troponin flat Does not appear to be ACS. ECG nonischemic. - No ASA given Eliquis  - continue Plavix  - On statin  - holding ? blocker w/ soft BP and bradycardia    4.  AKI on CKD 3a s/p renal transplant in 2012 (due to SLE): - Follows with nephrology, Dr. Clover Mealy and Dr. Lissa Merlin at [facility]. - Baseline Creatinine 1.4-1.6 - SCr up, 1.8>>2.0>>2.0   - Hold diuretics, Mearl Latin and Farxiga  - Avoid hypotension. Hold Bystolic  - Continue prednisone, Prograf and CellCept. - Nephrology now following. Serologies pending    5. Frequent PVCs - Zio 12/21 13.4% PVC burden on Zio with runs of SVT & NSVT. - PVCs reduced on mexilitene 300 mg bid.  - Zio (5/22): PVC 11.6 % burden - Zio (8/22): PVC 11.8% burden - Better suppressed w/ amio and mexiletine. <4/hr overnight    Length of Stay: 4   Brittainy Ladoris Gene  02/07/2021, 7:39 AM  Advanced Heart Failure Team Pager [phone] (M-F; 7a - 4p)  Please contact [facility] for night-coverage after hours (4p -7a ) and weekends on [email or web address]    Patient seen and examined with the above-signed Advanced Practice Provider and/or Housestaff. I personally reviewed laboratory data, imaging studies and relevant notes. I independently examined the patient and formulated the important aspects of the plan. I have edited the note to reflect any of my changes or salient points. I have personally discussed the plan with the patient and/or family.  Feels ok. CP better. A bit weak. SCr stable. h   General:  Weak appearing. No resp difficulty HEENT: normal Neck: supple. no JVD. Carotids 2+ bilat; no bruits. No lymphadenopathy or thryomegaly appreciated. Cor: PMI nondisplaced. Regular rate & rhythm. No rubs, gallops or murmurs. Lungs: clear Abdomen: soft, nontender, nondistended. No hepatosplenomegaly. No bruits or masses. Good bowel sounds. Extremities: no cyanosis, clubbing, rash, edema Neuro: alert & orientedx3,  cranial nerves grossly intact. moves all 4 extremities w/o difficulty. Affect pleasant  Likely can go home today if ok with nephrology. Would hold Lisabeth Register, Bystolic, spiro and torsemide. Can restart torsemide as needed  for volume overload at home. We will see back in clinic next week to restart GDMT as BP and renal function tolerate.   [name], MD  9:14 AM

## 2021-02-07 NOTE — Progress Notes (Signed)
[name] Interim Progress Note  S: Patient resting in bed, voiced no concerns tonight.  O: BP 119/75 (BP Location: Right Arm)   Pulse 61   Temp 98.2 F (36.8 C) (Oral)   Resp 18   Ht 5\' 7"  (1.702 m)   Wt 96.6 kg   SpO2 98%   BMI 33.34 kg/m    Gen: NAD, laying in bed responsive to voice Lungs: chest rises symmetrically, no iWOB  A/P: Chest Pain  HFrEF EF 30-35%  Pericarditis  -CBC, BMP -betablocker was held today  -colchicine continued daily  AKI -renal function panel -holding spironolactone,entresto,farxiga  Gerrit Heck, MD 02/07/2021, 11:27 PM PGY-1, [facility] Service pager [phone]

## 2021-02-07 NOTE — Progress Notes (Signed)
Family Medicine Teaching Service Daily Progress Note Intern Pager: [phone]  Patient name: Roy Dyer Medical record number: [id number] Date of birth: 1965-07-29 Age: 55 y.o. Gender: male  Primary Care Provider: [name], MD Consultants: Cardiology, Nephrology Code Status: Full  Pt Overview and Major Events to Date:  Pt admitted - 02/03/21  Assessment and Plan:  Roy Dyer is a 55 y.o. male presenting with chest pain and nausea. PMH is significant for severe premature CAD, ischemic CM/chronic systolic HF EF 72-09%, frequent PVCs, s/p AICD, SLE s/p renal transplant 2012, OSA on CPAP, HLD, HTN, prediabetes.     Chest Pain  HFrEF EF 30-35%  Pericarditis  Patient reports pain is slightly decreased from yesterday, but minimal. Feels well, able to take deep breaths, and lay in bed.  -Continue colchicine 0.6 mg daily, consider switching to 1 wk of steroids if colchicine d/c in setting of AKI -Continue tramdol 100 mg twice daily -F/u C3, C4, Anti-DNA, ANA, for concern of SLE flare   AKI Patient received 500 cc bolus yesterday. Cr 2.04 stable today, (2.04, 1.84), baseline 1.4-1.6, BUN 35 (33, 24) UA showed >500 Glc and otherwise negative. Mg and Phos at normal level thus no evidence of tubulopathy. Renal U/S of right transplanted kidney unremarkable. -Nephro consult appreciate recs -Hold Spironolactone, Delene Loll, Farxiga -F/u Urine NA, Urine Cr -Continue colchicine x 1 daily   Atrial Flutter  Frequent PVCs w/ AICD Is now in normal sinus rhythm, HR in 60's -Continue Eliquis 5 mg -Continue Clopidogrel 75 mg daily -Continue amiodarone 200 mg daily   Lupus  S/p Renal Transplant  -Continue tacrolimus 0.5 mg -Continue mycophenolate 50 mg -Continue prednisone 5 mg -F/u Tacrolimus trough  HLD -Continue Lipitor 80 mg -Hold niacin 500 mg for arrhythmia risk  HTN -BP 110's/60's today, stable -Hold Bystolic for soft BPs  Hx of Prediabetes  significantly elevated  glucose -Glucose 95-106 overnight, stable  GERD -Continue Pepcid  OSA -CPAP qhs   FEN/GI: Heart Healthy Diet PPx: Eliquis Dispo:Home pending clinical improvement    Subjective:  Patient reports slight improvement in pain, but overall has very little. Patient is feeling better and able to take deep breaths/lay down.  Objective: Temp:  [97.5 F (36.4 C)-98.3 F (36.8 C)] 98.3 F (36.8 C) (09/27 0446) Pulse Rate:  [61-65] 62 (09/27 0446) Resp:  [17-20] 17 (09/27 0446) BP: (103-111)/(52-69) 111/68 (09/27 0446) SpO2:  [93 %-97 %] 95 % (09/27 0446) Physical Exam: General: White male, appears stated age, no acute signs of distress, good affect Cardiovascular: Friction rub, no murmurs or gallops, regular rhythm Respiratory: CTABL, minimal crackles in lower lungs Abdomen: Soft, NT/ND, appreciate fluid wave Extremities: Pulses intact in all extremities, no edema  Laboratory: Recent Labs  Lab 02/05/21 0204 02/06/21 0123 02/07/21 0424  WBC 14.9* 14.4* 9.4  HGB 15.7 16.1 14.2  HCT 49.4 49.5 43.5  PLT 171 181 176   Recent Labs  Lab 02/03/21 1328 02/04/21 0543 02/05/21 0204 02/06/21 0123 02/07/21 0424  NA 137   < > 136 131* 133*  K 4.9   < > 4.4 4.0 3.8  CL 104   < > 99 94* 96*  CO2 26   < > 26 26 28   BUN 20   < > 24* 33* 35*  CREATININE 1.61*   < > 1.84* 2.04* 2.04*  CALCIUM 9.1   < > 9.0 8.9 8.7*  PROT 5.6*  --   --   --   --   BILITOT  0.7  --   --   --   --   ALKPHOS 92  --   --   --   --   ALT 14  --   --   --   --   AST 17  --   --   --   --   GLUCOSE 192*   < > 115* 95 106*   < > = values in this interval not displayed.     Imaging/Diagnostic Tests: Renal U/S of right transplanted kidney unremarkable.  Holley Bouche, MD 02/07/2021, 7:07 AM PGY-1, [place] Intern pager: [phone], text pages welcome

## 2021-02-08 ENCOUNTER — Other Ambulatory Visit (HOSPITAL_COMMUNITY): Payer: Self-pay

## 2021-02-08 DIAGNOSIS — R071 Chest pain on breathing: Secondary | ICD-10-CM | POA: Diagnosis not present

## 2021-02-08 DIAGNOSIS — I483 Typical atrial flutter: Secondary | ICD-10-CM | POA: Diagnosis not present

## 2021-02-08 DIAGNOSIS — Z9289 Personal history of other medical treatment: Secondary | ICD-10-CM

## 2021-02-08 DIAGNOSIS — I5023 Acute on chronic systolic (congestive) heart failure: Secondary | ICD-10-CM | POA: Diagnosis not present

## 2021-02-08 LAB — RENAL FUNCTION PANEL
Albumin: 3 g/dL — ABNORMAL LOW (ref 3.5–5.0)
Anion gap: 9 (ref 5–15)
BUN: 34 mg/dL — ABNORMAL HIGH (ref 6–20)
CO2: 27 mmol/L (ref 22–32)
Calcium: 8.9 mg/dL (ref 8.9–10.3)
Chloride: 99 mmol/L (ref 98–111)
Creatinine, Ser: 1.95 mg/dL — ABNORMAL HIGH (ref 0.61–1.24)
GFR, Estimated: 40 mL/min — ABNORMAL LOW (ref >60)
Glucose, Bld: 99 mg/dL (ref 70–99)
Phosphorus: 3.7 mg/dL (ref 2.5–4.6)
Potassium: 4 mmol/L (ref 3.5–5.1)
Sodium: 135 mmol/L (ref 135–145)

## 2021-02-08 LAB — ANTINUCLEAR ANTIBODIES, IFA: ANA Ab, IFA: NEGATIVE

## 2021-02-08 LAB — CBC
HCT: 43.6 % (ref 39.0–52.0)
Hemoglobin: 14.3 g/dL (ref 13.0–17.0)
MCH: 29.1 pg (ref 26.0–34.0)
MCHC: 32.8 g/dL (ref 30.0–36.0)
MCV: 88.6 fL (ref 80.0–100.0)
Platelets: 190 10*3/uL (ref 150–400)
RBC: 4.92 MIL/uL (ref 4.22–5.81)
RDW: 13.6 % (ref 11.5–15.5)
WBC: 8.8 10*3/uL (ref 4.0–10.5)
nRBC: 0 % (ref 0.0–0.2)

## 2021-02-08 LAB — TACROLIMUS LEVEL: Tacrolimus (FK506) - LabCorp: 8.8 ng/mL (ref 2.0–20.0)

## 2021-02-08 MED ORDER — COLCHICINE 0.6 MG PO TABS
0.6000 mg | ORAL_TABLET | Freq: Every day | ORAL | 0 refills | Status: DC
  Filled 2021-02-08: qty 30, 30d supply, fill #0

## 2021-02-08 MED ORDER — APIXABAN 5 MG PO TABS
5.0000 mg | ORAL_TABLET | Freq: Two times a day (BID) | ORAL | 0 refills | Status: DC
  Filled 2021-02-08: qty 60, 30d supply, fill #0

## 2021-02-08 MED ORDER — MELATONIN 3 MG PO TABS: 3.0000 mg | ORAL_TABLET | Freq: Every evening | ORAL | 0 refills | Status: AC | PRN

## 2021-02-08 MED ORDER — TORSEMIDE 20 MG PO TABS
20.0000 mg | ORAL_TABLET | ORAL | 0 refills | Status: DC | PRN
  Filled 2021-02-08: qty 30, 30d supply, fill #0

## 2021-02-08 MED ORDER — AMIODARONE HCL 200 MG PO TABS
200.0000 mg | ORAL_TABLET | Freq: Two times a day (BID) | ORAL | 0 refills | Status: DC
  Filled 2021-02-08: qty 60, 30d supply, fill #0

## 2021-02-08 MED ORDER — POTASSIUM CHLORIDE CRYS ER 20 MEQ PO TBCR
20.0000 meq | EXTENDED_RELEASE_TABLET | ORAL | 0 refills | Status: DC | PRN
  Filled 2021-02-08: qty 30, 30d supply, fill #0

## 2021-02-08 NOTE — Discharge Summary (Addendum)
[facility] Discharge Summary  Patient name: Roy Dyer Medical record number: [id number] Date of birth: 07-Dec-1965 Age: 55 y.o. 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 HTN OSA on CPAP Pre diabetes  Disposition: Home  Discharge Condition: Improved  Discharge Exam:  Temp:  [97.7 F (36.5 C)-98.2 F (36.8 C)] 98.2 F (36.8 C) (09/28 0626) Pulse Rate:  [59-64] 59 (09/28 0626) Resp:  [16-18] 16 (09/28 0626) BP: (103-120)/(57-75) 116/61 (09/28 0626) SpO2:  [95 %-98 %] 98 % (09/27 2235) Weight:  [94.9 kg] 94.9 kg (09/28 0500) Physical Exam: General: White male, well nourished, no acute signs of distress Cardiovascular: Friction rub, no murmurs or gallops Respiratory: CTABL w/ some coarse breath sounds in lower lung fields Abdomen: Soft, NT/ND, appreciate abdominal fluid wave Extremities: Pulses intact in all extremities, no edema  Brief Hospital Course:  Roy Dyer is a 55 y.o. male presenting with chest pain and nausea. PMH is significant for severe premature CAD, ischemic CM/chronic systolic HF EF 66-29%, frequent PVCs, s/p AICD, SLE s/p renal transplant 2012, OSA on CPAP, HLD, HTN, prediabetes.     Chest Pain  acute on chronic combined HFrEF 30-35%  Pericarditis  Patient presents after event of chest pain radiating into his neck and left side of his upper back.  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 Patient was tachycardic to 109 and tachypneic to 28 on admission, with EKG demonstrating atrial flutter with age-indeterminate  inferior and anterior infarct, with abnormal lateral Q waves. Labs demonstrated a glucose of 192 with a creatinine of 1.61 (baseline of 1.6), with a BNP of 1441.7 (792 4 months ago) and troponins of 21 and 20.  WBC was 12.5, Procalcitonin was 0.31, and a urinalysis showing > 500 glucose. CXR demonstrated mild left lateral lung base opacity that was concerning for atelectasis or pneumonia. Physical exam demonstrated crackles in lower lung fields, with heart exam notable for pericardial knock, and pleuritic chest pain.  Patient was given one dose of doxycycline and ceftriaxone for concern of pneumonia- later discontinued as primary diagnosis pericarditis. Cardiology was consulted and patient was later started on Colchicine for concern of pericarditis and tramadol BID for chest pain. Patient received diuresis. Home meds were continued, including Farxiga 10 daily, Spironolactone 47.6 mg daily, Bystolic 5 mg daily, Entresto 49/51 BID. However, these were later held due to AKI. At discharge Farxiga, Spironolactone, Bystolic, and Entresto were held in the setting of an AKI and low blood pressures, see below. Patient improved with low dose colchicine and diuresis.  Atrial Flutter  Frequent PVCs w/ AICD Patient noted to be in atrial flutter on EKG.  Patient with history of frequent PVCs (11.8% per 8/22 Zio) with AICD. Patient AICD had 41 AT/AF episodes since 8/19. Cardiology was consulted and patient was started on IV Amiodarone 9/23. Patient converted to NS rhythm 9/24 and was transitioned to oral amiodarone 9/25. Patient remained on home meds Plavix and Eliquis, but aspirin was held.  AKI Patient presented with a Cr of 1.61 on admission, with his baseline being 1.4-1.6. His Cr rose as high as 2.04 during admission. We gave him  a 500 cc fluid bolus and held his home meds (Farxiga, Spironolactone, Entresto, and Bystolic-held for soft BP). Nephrology team was following and found no evidence for proximal tubulopathy, or  SLE flare (unremarkable C3, C4, Anti-DNA, ANA), as etiology for AKI. At discharge patient Cr had improved to 1.95. We continued to hold his home meds at discharge, with expected close follow up with PCP and nephrology transplant specialist.   All other problems were chronic and stable.   Issues for Follow Up:  Follow up with heart failure team to evaluate restarting HF home medications. Follow up with renal transplant specialist PCP to check BMP at hospital follow to evaluate AKI  Significant Procedures: None  Significant Labs and Imaging:  Recent Labs  Lab 02/06/21 0123 02/07/21 0424 02/08/21 0231  WBC 14.4* 9.4 8.8  HGB 16.1 14.2 14.3  HCT 49.5 43.5 43.6  PLT 181 176 190   Recent Labs  Lab 02/03/21 1328 02/04/21 0543 02/04/21 0640 02/05/21 0204 02/06/21 0123 02/06/21 1737 02/07/21 0424 02/08/21 0231  NA 137 137  --  136 131*  --  133* 135  K 4.9 4.7  --  4.4 4.0  --  3.8 4.0  CL 104 99  --  99 94*  --  96* 99  CO2 26 25  --  26 26  --  28 27  GLUCOSE 192* 135*  --  115* 95  --  106* 99  BUN 20 22*  --  24* 33*  --  35* 34*  CREATININE 1.61* 1.86*  --  1.84* 2.04*  --  2.04* 1.95*  CALCIUM 9.1 9.4  --  9.0 8.9  --  8.7* 8.9  MG 1.7  --  2.2  --   --  2.3  --   --   PHOS  --   --   --   --   --  3.8  --  3.7  ALKPHOS 92  --   --   --   --   --   --   --   AST 17  --   --   --   --   --   --   --   ALT 14  --   --   --   --   --   --   --   ALBUMIN 3.5  --   --   --   --   --   --  3.0*   Imaging: DG Chest 1 View  Result Date: 02/05/2021 CLINICAL DATA:  Chest pain. EXAM: CHEST  1 VIEW COMPARISON:  02/04/2021 FINDINGS: Cardiac silhouette is mildly enlarged. Stable right anterior chest wall AICD. Clear lungs.  No pleural effusion or pneumothorax. Skeletal structures are grossly intact. IMPRESSION: No acute cardiopulmonary disease. Electronically Signed   By: Lajean Manes M.D.   On: 02/05/2021 14:35   DG Chest 2 View  Result Date: 02/04/2021 CLINICAL DATA:  Chest  discomfort EXAM: CHEST - 2 VIEW COMPARISON:  02/03/2021 FINDINGS: Right chest wall ICD noted with lead in the right ventricle. Mild cardiac enlargement, stable. No pleural effusion or edema. No airspace densities. IMPRESSION: No active cardiopulmonary abnormalities. Electronically Signed   By: Kerby Moors M.D.   On: 02/04/2021 09:03   NM Pulmonary Perfusion  Result Date: 02/05/2021 CLINICAL DATA:  Hx of CAD, AICD, Combined CHF, CKD III S/P renal transplant, HTN, HLD, Lupus, OSA o CPAP, PreDM presented with B/L chest pain radiating to his neck and back  with a dry cough that is intermittent. EXAM: NUCLEAR MEDICINE PERFUSION LUNG SCAN TECHNIQUE: Perfusion images were obtained in multiple projections after intravenous injection of radiopharmaceutical. Ventilation scans intentionally deferred if perfusion scan and chest x-ray adequate for interpretation during COVID 19 epidemic. RADIOPHARMACEUTICALS:  3.75 mCi Tc-22m MAA IV COMPARISON:  Current chest radiograph FINDINGS: No segmental perfusion defects to suggest pulmonary thromboemboli. Focal defect overlies the peripheral right lung from the patient's anterior chest wall AICD. Patchy relative decreased perfusion to the posterior aspect of the right upper lobe and apical segment of the right lower lobe. IMPRESSION: No evidence of a pulmonary thromboembolism. Electronically Signed   By: Lajean Manes M.D.   On: 02/05/2021 14:38   US Renal Transplant w/Doppler  Result Date: 02/06/2021 CLINICAL DATA:  Renal transplant 8 years ago, elevated creatinine EXAM: ULTRASOUND OF RENAL TRANSPLANT WITH RENAL DOPPLER ULTRASOUND TECHNIQUE: Ultrasound examination of the renal transplant was performed with gray-scale, color and duplex doppler evaluation. COMPARISON:  None. FINDINGS: Transplant kidney location: RLQ Transplant Kidney: Renal measurements: 10.7 x 6.2 x 6.6 cm = volume: 226.45mL. Normal in size and parenchymal echogenicity. No evidence of mass or hydronephrosis. No  peri-transplant fluid collection seen. Color flow in the main renal artery:  Yes Color flow in the main renal vein:  Yes Duplex Doppler Evaluation: Main Renal Artery Velocity: 141 cm/sec Main Renal Artery Resistive Index: 0.9 Venous waveform in main renal vein:  Present Intrarenal resistive index in upper pole:  0.6 (normal 0.6-0.8; equivocal 0.8-0.9; abnormal >= 0.9) Intrarenal resistive index in lower pole: 0.7 (normal 0.6-0.8; equivocal 0.8-0.9; abnormal >= 0.9) Bladder: Normal for degree of bladder distention. Other findings:  None. IMPRESSION: 1. Unremarkable right lower quadrant renal transplant. Electronically Signed   By: Randa Ngo M.D.   On: 02/06/2021 20:00   DG Chest Portable 1 View  Result Date: 02/03/2021 CLINICAL DATA:  Chest pain. EXAM: PORTABLE CHEST 1 VIEW COMPARISON:  05/18/2017. FINDINGS: Similar enlarged cardiac silhouette. Similar right subclavian approach single lead cardiac rhythm maintenance device. Mild left lateral basilar opacity. No definite pleural effusions. No visible pneumothorax. No acute osseous abnormality. IMPRESSION: 1. Mild left lateral lung base opacity, which could represent atelectasis or pneumonia. Dedicated PA and lateral radiographs could better characterize if clinically indicated. 2. Similar cardiomegaly. Electronically Signed   By: Margaretha Sheffield M.D.   On: 02/03/2021 13:11   ECHOCARDIOGRAM COMPLETE  Result Date: 02/04/2021    ECHOCARDIOGRAM REPORT   Patient Name:   Roy Dyer Date of Exam: 02/04/2021 Medical Rec #:  [id number]    Height:       67.0 in Accession #:    [id number]   Weight:       215.0 lb Date of Birth:  1966-03-11    BSA:          2.085 m Patient Age:    55 years     BP:           116/80 mmHg Patient Gender: M            HR:           74 bpm. Exam Location:  Inpatient Procedure: 2D Echo, Cardiac Doppler and Color Doppler Indications:    CHF  History:        Patient has prior history of Echocardiogram examinations, most                  recent 10/13/2020. CHF and Cardiomyopathy, Previous Myocardial  Infarction and CAD, Defibrillator, Arrythmias:Atrial Flutter;                 Signs/Symptoms:Chest Pain. Post op renal transplant.  Sonographer:    Dustin Flock RDCS Referring Phys: [id number] DANIEL R BENSIMHON IMPRESSIONS  1. Left ventricular ejection fraction, by estimation, is 30 to 35%. The left ventricle has moderately decreased function. The left ventricle demonstrates global hypokinesis. The left ventricular internal cavity size was moderately dilated. There is mild  left ventricular hypertrophy. Left ventricular diastolic parameters are consistent with Grade II diastolic dysfunction (pseudonormalization).  2. Right ventricular systolic function is normal. The right ventricular size is normal. There is moderately elevated pulmonary artery systolic pressure. The estimated right ventricular systolic pressure is 73.4 mmHg.  3. Left atrial size was severely dilated.  4. Right atrial size was severely dilated.  5. The mitral valve is normal in structure. Trivial mitral valve regurgitation. No evidence of mitral stenosis.  6. Tricuspid valve regurgitation is moderate.  7. The aortic valve is normal in structure. Aortic valve regurgitation is not visualized. Mild aortic valve sclerosis is present, with no evidence of aortic valve stenosis.  8. The inferior vena cava is dilated in size with <50% respiratory variability, suggesting right atrial pressure of 15 mmHg. Comparison(s): No significant change from prior study. Prior images reviewed side by side. FINDINGS  Left Ventricle: Left ventricular ejection fraction, by estimation, is 30 to 35%. The left ventricle has moderately decreased function. The left ventricle demonstrates global hypokinesis. The left ventricular internal cavity size was moderately dilated. There is mild left ventricular hypertrophy. Left ventricular diastolic parameters are consistent with Grade II diastolic dysfunction  (pseudonormalization). Right Ventricle: The right ventricular size is normal. No increase in right ventricular wall thickness. Right ventricular systolic function is normal. There is moderately elevated pulmonary artery systolic pressure. The tricuspid regurgitant velocity is 3.20 m/s, and with an assumed right atrial pressure of 15 mmHg, the estimated right ventricular systolic pressure is 19.3 mmHg. Left Atrium: Left atrial size was severely dilated. Right Atrium: Right atrial size was severely dilated. Pericardium: There is no evidence of pericardial effusion. Mitral Valve: The mitral valve is normal in structure. Trivial mitral valve regurgitation. No evidence of mitral valve stenosis. Tricuspid Valve: The tricuspid valve is normal in structure. Tricuspid valve regurgitation is moderate . No evidence of tricuspid stenosis. Aortic Valve: The aortic valve is normal in structure. Aortic valve regurgitation is not visualized. Mild aortic valve sclerosis is present, with no evidence of aortic valve stenosis. Pulmonic Valve: The pulmonic valve was normal in structure. Pulmonic valve regurgitation is not visualized. No evidence of pulmonic stenosis. Aorta: The aortic root is normal in size and structure. Venous: The inferior vena cava is dilated in size with less than 50% respiratory variability, suggesting right atrial pressure of 15 mmHg. IAS/Shunts: No atrial level shunt detected by color flow Doppler. Additional Comments: A device lead is visualized in the right ventricle.  LEFT VENTRICLE PLAX 2D LVIDd:         6.60 cm      Diastology LVIDs:         5.60 cm      LV e' medial:    7.83 cm/s LV PW:         1.30 cm      LV E/e' medial:  11.2 LV IVS:        1.40 cm      LV e' lateral:   9.25 cm/s LVOT diam:  2.40 cm      LV E/e' lateral: 9.5 LV SV:         93 LV SV Index:   45 LVOT Area:     4.52 cm  LV Volumes (MOD) LV vol d, MOD A4C: 216.0 ml LV vol s, MOD A4C: 134.0 ml LV SV MOD A4C:     216.0 ml RIGHT VENTRICLE  RV Basal diam:  3.50 cm RV S prime:     12.20 cm/s TAPSE (M-mode): 2.8 cm LEFT ATRIUM              Index       RIGHT ATRIUM           Index LA diam:        5.50 cm  2.64 cm/m  RA Area:     20.30 cm LA Vol (A2C):   101.0 ml 48.43 ml/m RA Volume:   59.90 ml  28.72 ml/m LA Vol (A4C):   100.0 ml 47.95 ml/m LA Biplane Vol: 105.0 ml 50.35 ml/m  AORTIC VALVE LVOT Vmax:   111.00 cm/s LVOT Vmean:  73.700 cm/s LVOT VTI:    0.206 m  AORTA Ao Root diam: 3.30 cm MITRAL VALVE               TRICUSPID VALVE MV Area (PHT): 5.97 cm    TR Peak grad:   41.0 mmHg MV Decel Time: 127 msec    TR Vmax:        320.00 cm/s MV E velocity: 87.50 cm/s MV A velocity: 30.00 cm/s  SHUNTS MV E/A ratio:  2.92        Systemic VTI:  0.21 m                            Systemic Diam: 2.40 cm Candee Furbish MD Electronically signed by Candee Furbish MD Signature Date/Time: 02/04/2021/3:22:25 PM    Final     Results/Tests Pending at Time of Discharge: None  Discharge Medications:  Allergies as of 02/08/2021       Reactions   Triptans Palpitations   Reaction to Maxalt        Medication List     STOP taking these medications    aspirin EC 81 MG tablet   Bystolic 5 MG tablet Generic drug: nebivolol   dapagliflozin propanediol 10 MG Tabs tablet Commonly known as: Farxiga   Dexilant 60 MG capsule Generic drug: dexlansoprazole   Entresto 49-51 MG Generic drug: sacubitril-valsartan   niacin 500 MG tablet   spironolactone 25 MG tablet Commonly known as: ALDACTONE       TAKE these medications    acetaminophen 500 MG tablet Commonly known as: TYLENOL Take 1,000 mg by mouth every 6 (six) hours as needed (for pain.).   allopurinol 100 MG tablet Commonly known as: ZYLOPRIM Take 1 tablet (100 mg total) by mouth daily.   amiodarone 200 MG tablet Commonly known as: PACERONE Take 1 tablet (200 mg total) by mouth 2 (two) times daily.   atorvastatin 80 MG tablet Commonly known as: LIPITOR Take 80 mg by mouth at  bedtime.   clopidogrel 75 MG tablet Commonly known as: PLAVIX TAKE 1 TABLET BY MOUTH DAILY   colchicine 0.6 MG tablet Take 1 tablet (0.6 mg total) by mouth daily. Start taking on: February 09, 2021   Eliquis 5 MG Tabs tablet Generic drug: apixaban Take 1 tablet (5 mg total) by mouth 2 (two) times daily.  famotidine 20 MG tablet Commonly known as: PEPCID Take 40 mg by mouth at bedtime.   fluticasone 50 MCG/ACT nasal spray Commonly known as: FLONASE Place 1 spray into both nostrils daily.   magnesium oxide 400 MG tablet Commonly known as: MAG-OX Take 400 mg by mouth daily.   melatonin 3 MG Tabs tablet Take 1 tablet (3 mg total) by mouth at bedtime as needed.   mexiletine 150 MG capsule Commonly known as: MEXITIL Take 2 capsules (300 mg total) by mouth 2 (two) times daily.   mycophenolate 250 MG capsule Commonly known as: CELLCEPT Take 3 capsules (750 mg total) by mouth 2 (two) times daily.   nitroGLYCERIN 0.4 MG SL tablet Commonly known as: NITROSTAT Place 1 tablet (0.4 mg total) under the tongue every 5 (five) minutes as needed for chest pain (DO NOT EXCEED 3 DOSES).   potassium chloride SA 20 MEQ tablet Commonly known as: KLOR-CON Take 1 tablet (20 mEq total) by mouth as needed (for heart failure symptoms per Dr. Jeffie Pollock).   predniSONE 5 MG tablet Commonly known as: DELTASONE TAKE 1 TABLET BY MOUTH DAILY What changed: when to take this   tacrolimus 0.5 MG capsule Commonly known as: PROGRAF Take 0.5 mg by mouth every 12 (twelve) hours.   torsemide 20 MG tablet Commonly known as: Demadex Take 1 tablet (20 mg total) by mouth as needed (for heart failure symptoms per Dr. Jeffie Pollock).   traMADol 50 MG tablet Commonly known as: ULTRAM Take 50 mg by mouth every 6 (six) hours as needed. for pain        Discharge Instructions: Please refer to Patient Instructions section of EMR for full details.  Patient was counseled important signs and symptoms that  should prompt return to medical care, changes in medications, dietary instructions, activity restrictions, and follow up appointments.   Follow-Up Appointments:  Follow-up Information     [facility] Follow up.   Specialty: Cardiology Why: 02/16/21 at 9:30 AM [facility]. Parking Garage Code 3009 Contact information: [address] [phone] Danice Goltz [place] [address] [phone]        [name], MD. Schedule an appointment as soon as possible for a visit.   Specialty: Family Medicine Why: Please make an appointment at your earliest convenience for a hospital follow up. Contact information: [address] [place] [address] [phone]         Josue Hector, MD .   Specialty: Cardiology Contact information: [phone] [address] [place] [address] [phone]                Holley Bouche, MD 02/08/2021, 3:48 PM PGY-1, [place] Upper-Level Resident Addendum   I have independently interviewed and examined the patient. I have discussed the above with the original author and agree with their documentation. I have made additional edits as necessary. Please see also any attending notes.   Gladys Damme, M.D. PGY-3 [facility] 02/08/2021 7:37 PM

## 2021-02-08 NOTE — Progress Notes (Signed)
Family Medicine Teaching Service Daily Progress Note Intern Pager: [phone]  Patient name: Roy Dyer Medical record number: [id number] Date of birth: [birth year] Age: 55 y.o. Gender: male  Primary Care Provider: [name], MD Consultants: Cardiology, Nephrology Code Status: Full  Pt Overview and Major Events to Date:  Pt admitted - 02/03/21  Assessment and Plan: Roy Dyer is a 55 y.o. male presenting with chest pain and nausea. PMH is significant for severe premature CAD, ischemic CM/chronic systolic HF EF 74-94%, frequent PVCs, s/p AICD, SLE s/p renal transplant 2012, OSA on CPAP, HLD, HTN, prediabetes.     Chest Pain  HFrEF EF 30-35%  Pericarditis  C3 114, C4 19, Anti DNA 1, all normal -F/u ANA -Continue colchicine 0.6 mg daily, can consider week of steroids and colchicine DC'd in setting of AKI   AKI Patient creatinine 1.95 today, (2.04, 2.04,1.84), baseline 1.4-1.6, BUN 34 (35, 33, 24). Urine Na < 10 and Urine Cr 63.67 -Nephro consult appreciate recs -Hold spironolactone, Entresto, Farxiga -Continue colchicine x1 daily  Atrial Flutter  Frequent PVCs w/ AICD Patient in normal sinus rhythm -Continue Eliquis 5 mg daily -Continue clopidogrel 75 mg daily -Continue amiodarone 200 mg daily  Lupus  S/p Renal Transplant  -F/u tacrolimus trough level  -Continue tacrolimus 0.5 mg -Continue mycophenolate 50 mg daily -Continue prednisone 5 mg daily  HLD -Continue Lipitor 80 mg -Hold Niacin 500 mg for arrhythmia risk  HTN BP 110-120's/50-70's today, stable Hold Bystolic for soft BP's  Hx of Prediabetes  significantly elevated glucose Glucose 106-99 overnight, stable  GERD -Continue Pepcid  OSA -CPAP qhs   FEN/GI: Healthy Heart Diet PPx: Eliquis Dispo:Home pending clinical improvement .    Subjective:  Patient with no complaints of pain today, slept with CPAP overnight but was uncomfortable, as it was not his home machine.  Objective: Temp:   [97.7 F (36.5 C)-98.2 F (36.8 C)] 98.2 F (36.8 C) (09/28 0626) Pulse Rate:  [59-64] 59 (09/28 0626) Resp:  [16-18] 16 (09/28 0626) BP: (103-120)/(57-75) 116/61 (09/28 0626) SpO2:  [95 %-98 %] 98 % (09/27 2235) Weight:  [94.9 kg] 94.9 kg (09/28 0500) Physical Exam: General: White male, well nourished, no acute signs of distress Cardiovascular: Friction rub, no murmurs or gallops Respiratory: CTABL w/ some coarse breath sounds in lower lung fields Abdomen: Soft, NT/ND, appreciate abdominal fluid wave Extremities: Pulses intact in all extremities, no edema  Laboratory: Recent Labs  Lab 02/06/21 0123 02/07/21 0424 02/08/21 0231  WBC 14.4* 9.4 8.8  HGB 16.1 14.2 14.3  HCT 49.5 43.5 43.6  PLT 181 176 190   Recent Labs  Lab 02/03/21 1328 02/04/21 0543 02/06/21 0123 02/07/21 0424 02/08/21 0231  NA 137   < > 131* 133* 135  K 4.9   < > 4.0 3.8 4.0  CL 104   < > 94* 96* 99  CO2 26   < > 26 28 27   BUN 20   < > 33* 35* 34*  CREATININE 1.61*   < > 2.04* 2.04* 1.95*  CALCIUM 9.1   < > 8.9 8.7* 8.9  PROT 5.6*  --   --   --   --   BILITOT 0.7  --   --   --   --   ALKPHOS 92  --   --   --   --   ALT 14  --   --   --   --   AST 17  --   --   --   --  GLUCOSE 192*   < > 95 106* 99   < > = values in this interval not displayed.      Imaging/Diagnostic Tests:   Holley Bouche, MD 02/08/2021, 6:56 AM PGY-1, [place] Intern pager: [phone], text pages welcome

## 2021-02-08 NOTE — Progress Notes (Addendum)
Advanced Heart Failure Rounding Note   Subjective:    NSR/SB 50s-60s overnight. Rare PVCs.   Feeling well. Pleuritic chest pain much improved. No dyspnea. Up ambulating the halls. Eager to go home.  SCr remains elevated from baseline but stable, 1.84>>2.04>2.04 > 1.95. Got 500 cc fluid bolus 09/26.  Diuretics remain on hold. Wt down 6 lb from admit. Nephrology now following and checking serologies (in process). UA unremarkable.   SBP somewhat improved, 110s-120 this am    Echo 02/04/21: EF 30-35% No effusion   Objective:   Weight Range:  Vital Signs:   Temp:  [97.7 F (36.5 C)-98.2 F (36.8 C)] 98.2 F (36.8 C) (09/28 0626) Pulse Rate:  [59-64] 59 (09/28 0626) Resp:  [16-18] 16 (09/28 0626) BP: (103-120)/(57-75) 116/61 (09/28 0626) SpO2:  [95 %-98 %] 98 % (09/27 2235) Weight:  [94.9 kg] 94.9 kg (09/28 0500) Last BM Date: 02/07/21  Weight change: Filed Weights   02/03/21 1216 02/06/21 0446 02/08/21 0500  Weight: 97.5 kg 96.6 kg 94.9 kg    Intake/Output:   Intake/Output Summary (Last 24 hours) at 02/08/2021 0913 Last data filed at 02/07/2021 2122 Gross per 24 hour  Intake 240 ml  Output 300 ml  Net -60 ml     PHYSICAL EXAM: General:  Well appearing middle aged male. No resp difficulty HEENT: normal Neck: supple. no JVD. Carotids 2+ bilat; no bruits. No lymphadenopathy or thryomegaly appreciated. Cor: PMI nondisplaced. Regular rate & rhythm. No rubs, gallops or murmurs. Lungs: clear Abdomen: soft, nontender, nondistended. No hepatosplenomegaly.  Extremities: no cyanosis, clubbing, rash, edema Neuro: alert & orientedx3, cranial nerves grossly intact. moves all 4 extremities w/o difficulty. Affect pleasant     Telemetry: NSR/Sinus brady overnight rate 50s-60s, rare PVCs   Labs: Basic Metabolic Panel: Recent Labs  Lab 02/03/21 1328 02/04/21 0543 02/04/21 0640 02/05/21 0204 02/06/21 0123 02/06/21 1737 02/07/21 0424 02/08/21 0231  NA 137 137  --   136 131*  --  133* 135  K 4.9 4.7  --  4.4 4.0  --  3.8 4.0  CL 104 99  --  99 94*  --  96* 99  CO2 26 25  --  26 26  --  28 27  GLUCOSE 192* 135*  --  115* 95  --  106* 99  BUN 20 22*  --  24* 33*  --  35* 34*  CREATININE 1.61* 1.86*  --  1.84* 2.04*  --  2.04* 1.95*  CALCIUM 9.1 9.4  --  9.0 8.9  --  8.7* 8.9  MG 1.7  --  2.2  --   --  2.3  --   --   PHOS  --   --   --   --   --  3.8  --  3.7    Liver Function Tests: Recent Labs  Lab 02/03/21 1328 02/08/21 0231  AST 17  --   ALT 14  --   ALKPHOS 92  --   BILITOT 0.7  --   PROT 5.6*  --   ALBUMIN 3.5 3.0*   Recent Labs  Lab 02/03/21 1328  LIPASE 28   No results for input(s): AMMONIA in the last 168 hours.  CBC: Recent Labs  Lab 02/04/21 0640 02/05/21 0204 02/06/21 0123 02/07/21 0424 02/08/21 0231  WBC 14.2* 14.9* 14.4* 9.4 8.8  HGB 17.2* 15.7 16.1 14.2 14.3  HCT 52.1* 49.4 49.5 43.5 43.6  MCV 89.4 90.0 90.5 88.4 88.6  PLT  171 171 181 176 190    Cardiac Enzymes: No results for input(s): CKTOTAL, CKMB, CKMBINDEX, TROPONINI in the last 168 hours.  BNP: BNP (last 3 results) Recent Labs    05/19/20 0958 09/07/20 1047 02/03/21 1329  BNP 1,456.0* 792.8* 1,441.7*    ProBNP (last 3 results) No results for input(s): PROBNP in the last 8760 hours.    Other results:  Imaging: US Renal Transplant w/Doppler  Result Date: 02/06/2021 CLINICAL DATA:  Renal transplant 8 years ago, elevated creatinine EXAM: ULTRASOUND OF RENAL TRANSPLANT WITH RENAL DOPPLER ULTRASOUND TECHNIQUE: Ultrasound examination of the renal transplant was performed with gray-scale, color and duplex doppler evaluation. COMPARISON:  None. FINDINGS: Transplant kidney location: RLQ Transplant Kidney: Renal measurements: 10.7 x 6.2 x 6.6 cm = volume: 226.32mL. Normal in size and parenchymal echogenicity. No evidence of mass or hydronephrosis. No peri-transplant fluid collection seen. Color flow in the main renal artery:  Yes Color flow in the main  renal vein:  Yes Duplex Doppler Evaluation: Main Renal Artery Velocity: 141 cm/sec Main Renal Artery Resistive Index: 0.9 Venous waveform in main renal vein:  Present Intrarenal resistive index in upper pole:  0.6 (normal 0.6-0.8; equivocal 0.8-0.9; abnormal >= 0.9) Intrarenal resistive index in lower pole: 0.7 (normal 0.6-0.8; equivocal 0.8-0.9; abnormal >= 0.9) Bladder: Normal for degree of bladder distention. Other findings:  None. IMPRESSION: 1. Unremarkable right lower quadrant renal transplant. Electronically Signed   By: Randa Ngo M.D.   On: 02/06/2021 20:00     Medications:     Scheduled Medications:  amiodarone  200 mg Oral BID   apixaban  5 mg Oral BID   atorvastatin  80 mg Oral QHS   clopidogrel  75 mg Oral Daily   colchicine  0.6 mg Oral Daily   famotidine  40 mg Oral QHS   mexiletine  300 mg Oral BID   mycophenolate  750 mg Oral BID   predniSONE  5 mg Oral Daily   tacrolimus  0.5 mg Oral Q12H   traMADol  100 mg Oral Q12H    Infusions:  sodium chloride Stopped (02/03/21 2329)    PRN Medications:    Assessment&Plan:   1. New onset atrial flutter: -Multiple AT/AF episodes since last interrogation. Exact onset not known. Converted with amio. -Remains in NSR/SB, HR 50s-60s  -Continue PO amio 200 mg bid, can decrease to 200 mg daily at f/u visit -Continue Eliquis 5 mg BID -Holding ? blocker for now w/ soft BP and bradycardia  -Echo 02/04/21: EF 30-35% No effusion    2. Pleuritic chest pain: - V/Q scan negative for PE  -? Pericarditis given symptoms and recent vaccine. Treating empirically with colchicine 0.6 mg daily X 2-3 months. No NSAIDs as he is post renal transplant  - No effusion on echo - Nephrology also following and ok w/ continuing colchicine. Recommend low-dose due to risk of myopathy with Prograf.  3. Acute on chronic systolic heart failure/iCM - HX ICD in 2012 - Echo 12/17 EF 30%  - Cath 04/22/20 for worsening HF symptoms. Stable CAD. - Echo  6/22 EF 30-35%, Grade III DD, severe LAE, mild/mod TR - Echo 02/04/21: EF 30-35% No effusion  - Volume overloaded on admit by device interrogation. Volume status improved w/ IV Lasix  - ? Overdiuresis w/ AKI. 500 cc bolus given 09/26. Now holding lasix - Hold Mearl Latin and Farxiga w/ AKI  - Hold bystolic. Need to avoid hypotension given AKI  - monitor daily wts and strict I/Os  3.  CAD with prior MI x2 and ischemic cardiomyopathy - Multiple stents to LAD, RCA and LCX - Cath 04/22/20 for worsening HF symptoms with stable disease - HS troponin flat Does not appear to be ACS. ECG nonischemic. - No ASA given Eliquis  - continue Plavix  - On statin  - holding ? blocker w/ soft BP and bradycardia   4.  AKI on CKD 3a s/p renal transplant in 2012 (due to SLE): - Follows with nephrology, Dr. Clover Mealy and Dr. Lissa Merlin at [facility]. - Baseline Creatinine 1.4-1.6 - SCr up, 1.8>>2.0>>2.0>>1.95 - Hold diuretics, Mearl Latin and Farxiga  - Avoid hypotension. Hold Bystolic  - Continue prednisone, Prograf and CellCept. - Nephrology now following. Serologies pending    5. Frequent PVCs - Zio 12/21 13.4% PVC burden on Zio with runs of SVT & NSVT. - PVCs reduced on mexilitene 300 mg bid.  - Zio (5/22): PVC 11.6 % burden - Zio (8/22): PVC 11.8% burden - Better suppressed w/ amio and mexiletine. Rare overnight.  Okay for discharge from HF perspective MEDICATIONS AT DISCHARGE: -Amiodarone 200 mg BID can reduce to 200 mg daily at f/u visit -Apixaban 5 mg BID -Atorvastatin 80 mg daily -Clopidogrel 75 mg daily -Colchicine 0.6 mg daily -Mexiletine 300 mg BID -Can use 20 mg torsemide + 20 mEq K prn for CHF symptoms -Hold bystolic, entresto, spiro and farxiga - will add back as able at f/u -Remain off aspirin  Has f/u scheduled in HF clinic   Length of Stay: [place], LINDSAY N PA-C  02/08/2021, 9:13 AM  Advanced Heart Failure Team Pager [phone] (M-F; 7a - 4p)  Please contact [facility] for night-coverage after hours (4p -7a ) and weekends on [email or web address]   Patient seen and examined with the above-signed Advanced Practice Provider and/or Housestaff. I personally reviewed laboratory data, imaging studies and relevant notes. I independently examined the patient and formulated the important aspects of the plan. I have edited the note to reflect any of my changes or salient points. I have personally discussed the plan with the patient and/or family.  Here is feeling better. CP resolved. SCr improving with IVF   General:  Well appearing. No resp difficulty HEENT: normal Neck: supple. no JVD. Carotids 2+ bilat; no bruits. No lymphadenopathy or thryomegaly appreciated. Cor: PMI nondisplaced. Regular rate & rhythm. No rubs, gallops or murmurs. Lungs: clear Abdomen: soft, nontender, nondistended. No hepatosplenomegaly. No bruits or masses. Good bowel sounds. Extremities: no cyanosis, clubbing, rash, edema Neuro: alert & orientedx3, cranial nerves grossly intact. moves all 4 extremities w/o difficulty. Affect pleasant  OK for d/c today. Hold HF meds as above (wil restart in clinic). I have asked him to resume [place] when fluid starts to re-accumulate.   [name], MD  10:42 AM

## 2021-02-08 NOTE — Progress Notes (Signed)
[facility] Progress Note   55 y.o. 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 Pain was improved when sitting up. He has a dry intermittent cough and was treated for CHF with a presenting BNP of 1400 with Lasix 80mg  IV x1; he sees Dr. Haroldine Laws for his heart failure and EDW is 212-213 lbs. His baseline creatinine is 1.4-1.6. Patient started on Colchicine 0.6mg  qd for pericarditis.  He is on Cellcept 750mg  BID, Prednisone 5mg  and Prograf 0.5mg  q12hrs.   Assessment/ Plan:   Acute renal failure on CKD 3a w/ BL cr 1.4-1.6 followed by Dr. Moshe Cipro and Dr. Esaw Dace @ [place] transplant. Unlikely to be flare given h/o ESRD + on Cellcept post transplant. Other DDx are prerenal azotemia (has not had a good appetite and appears to be relatively euvolemic and certainly not in florid CHF) vs proximal tubulopathy from the Colchicine. - Sending serologies but low suspicion for lupus flare. Urine microscopy and urine studies not suggestive of flare either. - Agree with gentle hydration + holding spironlactone, entresto and [place]. - Continue immunosuppresives and will check a tac trough as well.  - Ok to continue the Colchicine for now (already on low dose); no e/o  proximal tubulopathy. Would not increase Colchicine to treatment dose given risk of myopathy with Prograf.  - Fortunately renal function stabilizing, HF team holding medications to be restarted at clinic which is very reasonable. From renal standpoint stable for d/c. He has f/u with [facility] transplant in a few mths which is fine.  Pleuritic chest pain thought to be secondary to pericarditis - improving already. CHF relatively well compensated.  Subjective:   Denies f/c/n/v/ dyspnea. No further  CP.   Objective:   BP 116/61 (BP Location: Right Arm)   Pulse (!) 59   Temp 98.2 F (36.8 C) (Oral)   Resp 16   Ht 5\' 7"  (1.702 m)   Wt 94.9 kg   SpO2 98%   BMI 32.78 kg/m   Intake/Output Summary (Last 24 hours) at 02/08/2021 1112 Last data filed at 02/07/2021 2122 Gross per 24 hour  Intake --  Output 300 ml  Net -300 ml   Weight change:   Physical Exam: GEN: NAD, A&Ox3, NCAT HEENT: No conjunctival pallor, EOMI NECK: Supple, no thyromegaly, no JVD LUNGS: CTA B/L no rales, rhonchi or wheezing CV: RRR, rub present, no S3 or S4 ABD: SNDNT +BS, no tenderness over transplant in the RLQ EXT: No lower extremity edema  Imaging: US Renal Transplant w/Doppler  Result Date: 02/06/2021 CLINICAL DATA:  Renal transplant 8 years ago, elevated creatinine EXAM: ULTRASOUND OF RENAL TRANSPLANT WITH RENAL DOPPLER ULTRASOUND TECHNIQUE: Ultrasound examination of the renal transplant was performed with gray-scale, color and duplex doppler evaluation. COMPARISON:  None. FINDINGS: Transplant kidney location: RLQ Transplant Kidney: Renal measurements: 10.7 x 6.2 x 6.6 cm = volume: 226.12mL. Normal in size and parenchymal echogenicity. No evidence of mass or hydronephrosis. No peri-transplant fluid collection seen. Color flow in the main renal artery:  Yes Color flow in the main renal vein:  Yes Duplex Doppler Evaluation: Main Renal Artery Velocity: 141 cm/sec Main Renal Artery Resistive Index: 0.9 Venous waveform in main renal vein:  Present Intrarenal resistive index in upper pole:  0.6 (normal 0.6-0.8; equivocal  0.8-0.9; abnormal >= 0.9) Intrarenal resistive index in lower pole: 0.7 (normal 0.6-0.8; equivocal 0.8-0.9; abnormal >= 0.9) Bladder: Normal for degree of bladder distention. Other findings:  None. IMPRESSION: 1. Unremarkable right lower quadrant renal transplant. Electronically Signed   By: Randa Ngo M.D.   On: 02/06/2021 20:00    Labs: BMET Recent Labs  Lab 02/03/21 1315 02/03/21 1328  02/04/21 0543 02/05/21 0204 02/06/21 0123 02/06/21 1737 02/07/21 0424 02/08/21 0231  NA 138 137 137 136 131*  --  133* 135  K 4.8 4.9 4.7 4.4 4.0  --  3.8 4.0  CL 102 104 99 99 94*  --  96* 99  CO2  --  26 25 26 26   --  28 27  GLUCOSE 185* 192* 135* 115* 95  --  106* 99  BUN 22* 20 22* 24* 33*  --  35* 34*  CREATININE 1.60* 1.61* 1.86* 1.84* 2.04*  --  2.04* 1.95*  CALCIUM  --  9.1 9.4 9.0 8.9  --  8.7* 8.9  PHOS  --   --   --   --   --  3.8  --  3.7   CBC Recent Labs  Lab 02/05/21 0204 02/06/21 0123 02/07/21 0424 02/08/21 0231  WBC 14.9* 14.4* 9.4 8.8  HGB 15.7 16.1 14.2 14.3  HCT 49.4 49.5 43.5 43.6  MCV 90.0 90.5 88.4 88.6  PLT 171 181 176 190    Medications:     amiodarone  200 mg Oral BID   apixaban  5 mg Oral BID   atorvastatin  80 mg Oral QHS   clopidogrel  75 mg Oral Daily   colchicine  0.6 mg Oral Daily   famotidine  40 mg Oral QHS   mexiletine  300 mg Oral BID   mycophenolate  750 mg Oral BID   predniSONE  5 mg Oral Daily   tacrolimus  0.5 mg Oral Q12H   traMADol  100 mg Oral Q12H      Otelia Santee, MD 02/08/2021, 11:12 AM

## 2021-02-09 ENCOUNTER — Telehealth: Payer: Self-pay | Admitting: *Deleted

## 2021-02-09 NOTE — Telephone Encounter (Signed)
Transition Care Management Unsuccessful Follow-up Telephone Call  Date of discharge and from where:  [facility] 02-08-2021  Attempts:  1st Attempt  Reason for unsuccessful TCM follow-up call:  Left voice message

## 2021-02-10 ENCOUNTER — Telehealth: Payer: Self-pay

## 2021-02-10 NOTE — Telephone Encounter (Signed)
Transition Care Management Follow-up Telephone Call Date of discharge and from where: [facility] 02/08/2021 How have you been since you were released from the hospital? Patient notes he is feeling better, pain has resolved although he still feels winded. Any questions or concerns? Yes- Patient asked why he was taken off his Dexilant 60mg  as he thinks he should still be on medication.  He will discuss at PCP appointment or the appointment with his cardiologist.  Patient states he has a poor appetite.  Items Reviewed: Did the pt receive and understand the discharge instructions provided? Yes  Medications obtained and verified? Yes  Other? No  Any new allergies since your discharge? No  Dietary orders reviewed? Yes Do you have support at home? Yes   Home Care and Equipment/Supplies: Were home health services ordered? no If so, what is the name of the agency? N/a  Has the agency set up a time to come to the patient's home? not applicable Were any new equipment or medical supplies ordered?  No What is the name of the medical supply agency? N/a Were you able to get the supplies/equipment? not applicable Do you have any questions related to the use of the equipment or supplies? No  Functional Questionnaire: (I = Independent and D = Dependent) ADLs: I  Bathing/Dressing- I  Meal Prep- I  Eating- I  Maintaining continence- Yes  Transferring/Ambulation- I  Managing Meds- Yes  Follow up appointments reviewed:  PCP Hospital f/u appt confirmed? No   Specialist Hospital f/u appt confirmed? Yes  Scheduled to see cardiologist on 02/16/21 @ 02/10/21. Are transportation arrangements needed? No  If their condition worsens, is the pt aware to call PCP or go to the Emergency Dept.? Yes Was the patient provided with contact information for the PCP's office or ED? Yes Was to pt encouraged to call back with questions or concerns? Yes

## 2021-02-13 ENCOUNTER — Encounter: Payer: Self-pay | Admitting: Family Medicine

## 2021-02-13 LAB — MYCOPHENOLIC ACID (CELLCEPT)
MPA Glucuronide: 94 ug/mL (ref 15–125)
MPA: 3.9 ug/mL — ABNORMAL HIGH (ref 1.0–3.5)

## 2021-02-14 ENCOUNTER — Observation Stay (HOSPITAL_COMMUNITY)
Admission: EM | Admit: 2021-02-14 | Discharge: 2021-02-15 | Disposition: A | Discharge status: Home / Self Care | Payer: Managed Care, Other (non HMO) | Attending: Emergency Medicine | Admitting: Emergency Medicine

## 2021-02-14 ENCOUNTER — Emergency Department (HOSPITAL_COMMUNITY): Payer: Managed Care, Other (non HMO)

## 2021-02-14 ENCOUNTER — Telehealth: Payer: Self-pay | Admitting: Cardiology

## 2021-02-14 ENCOUNTER — Encounter (HOSPITAL_COMMUNITY): Payer: Self-pay

## 2021-02-14 DIAGNOSIS — N183 Chronic kidney disease, stage 3 unspecified: Secondary | ICD-10-CM | POA: Insufficient documentation

## 2021-02-14 DIAGNOSIS — Z940 Kidney transplant status: Secondary | ICD-10-CM

## 2021-02-14 DIAGNOSIS — Z7901 Long term (current) use of anticoagulants: Secondary | ICD-10-CM | POA: Diagnosis not present

## 2021-02-14 DIAGNOSIS — Z9989 Dependence on other enabling machines and devices: Secondary | ICD-10-CM

## 2021-02-14 DIAGNOSIS — Z79899 Other long term (current) drug therapy: Secondary | ICD-10-CM | POA: Insufficient documentation

## 2021-02-14 DIAGNOSIS — I509 Heart failure, unspecified: Secondary | ICD-10-CM | POA: Diagnosis not present

## 2021-02-14 DIAGNOSIS — I5043 Acute on chronic combined systolic (congestive) and diastolic (congestive) heart failure: Secondary | ICD-10-CM | POA: Diagnosis present

## 2021-02-14 DIAGNOSIS — R7303 Prediabetes: Secondary | ICD-10-CM | POA: Diagnosis not present

## 2021-02-14 DIAGNOSIS — I5020 Unspecified systolic (congestive) heart failure: Secondary | ICD-10-CM | POA: Diagnosis present

## 2021-02-14 DIAGNOSIS — Z87891 Personal history of nicotine dependence: Secondary | ICD-10-CM | POA: Insufficient documentation

## 2021-02-14 DIAGNOSIS — Z20822 Contact with and (suspected) exposure to covid-19: Secondary | ICD-10-CM | POA: Diagnosis not present

## 2021-02-14 DIAGNOSIS — I4891 Unspecified atrial fibrillation: Principal | ICD-10-CM | POA: Insufficient documentation

## 2021-02-14 DIAGNOSIS — I4892 Unspecified atrial flutter: Secondary | ICD-10-CM

## 2021-02-14 DIAGNOSIS — G4733 Obstructive sleep apnea (adult) (pediatric): Secondary | ICD-10-CM

## 2021-02-14 DIAGNOSIS — Z95810 Presence of automatic (implantable) cardiac defibrillator: Secondary | ICD-10-CM | POA: Diagnosis present

## 2021-02-14 DIAGNOSIS — I2510 Atherosclerotic heart disease of native coronary artery without angina pectoris: Secondary | ICD-10-CM | POA: Diagnosis not present

## 2021-02-14 DIAGNOSIS — I130 Hypertensive heart and chronic kidney disease with heart failure and stage 1 through stage 4 chronic kidney disease, or unspecified chronic kidney disease: Secondary | ICD-10-CM | POA: Diagnosis not present

## 2021-02-14 DIAGNOSIS — M329 Systemic lupus erythematosus, unspecified: Secondary | ICD-10-CM | POA: Diagnosis present

## 2021-02-14 DIAGNOSIS — I255 Ischemic cardiomyopathy: Secondary | ICD-10-CM | POA: Diagnosis present

## 2021-02-14 LAB — CBC
HCT: 45.9 % (ref 39.0–52.0)
Hemoglobin: 15 g/dL (ref 13.0–17.0)
MCH: 29.2 pg (ref 26.0–34.0)
MCHC: 32.7 g/dL (ref 30.0–36.0)
MCV: 89.3 fL (ref 80.0–100.0)
Platelets: 313 10*3/uL (ref 150–400)
RBC: 5.14 MIL/uL (ref 4.22–5.81)
RDW: 13.2 % (ref 11.5–15.5)
WBC: 10.2 10*3/uL (ref 4.0–10.5)
nRBC: 0 % (ref 0.0–0.2)

## 2021-02-14 LAB — RESP PANEL BY RT-PCR (FLU A&B, COVID) ARPGX2
Influenza A by PCR: NEGATIVE
Influenza B by PCR: NEGATIVE
SARS Coronavirus 2 by RT PCR: NEGATIVE

## 2021-02-14 LAB — BASIC METABOLIC PANEL
Anion gap: 11 (ref 5–15)
BUN: 23 mg/dL — ABNORMAL HIGH (ref 6–20)
CO2: 27 mmol/L (ref 22–32)
Calcium: 9.2 mg/dL (ref 8.9–10.3)
Chloride: 100 mmol/L (ref 98–111)
Creatinine, Ser: 2.06 mg/dL — ABNORMAL HIGH (ref 0.61–1.24)
GFR, Estimated: 38 mL/min — ABNORMAL LOW (ref >60)
Glucose, Bld: 124 mg/dL — ABNORMAL HIGH (ref 70–99)
Potassium: 4.5 mmol/L (ref 3.5–5.1)
Sodium: 138 mmol/L (ref 135–145)

## 2021-02-14 LAB — BRAIN NATRIURETIC PEPTIDE: B Natriuretic Peptide: 1742.3 pg/mL — ABNORMAL HIGH (ref 0.0–100.0)

## 2021-02-14 LAB — TROPONIN I (HIGH SENSITIVITY): Troponin I (High Sensitivity): 38 ng/L — ABNORMAL HIGH (ref <18)

## 2021-02-14 MED ORDER — PREDNISONE 5 MG PO TABS
5.0000 mg | ORAL_TABLET | Freq: Every day | ORAL | Status: DC
  Administered 2021-02-15: 5 mg via ORAL
  Filled 2021-02-14: qty 1

## 2021-02-14 MED ORDER — AMIODARONE HCL IN DEXTROSE 360-4.14 MG/200ML-% IV SOLN: 30.0000 mg/h | INTRAVENOUS | Status: DC

## 2021-02-14 MED ORDER — AMIODARONE LOAD VIA INFUSION
150.0000 mg | Freq: Once | INTRAVENOUS | Status: AC
  Administered 2021-02-14: 150 mg via INTRAVENOUS
  Filled 2021-02-14: qty 83.34

## 2021-02-14 MED ORDER — MAGNESIUM OXIDE -MG SUPPLEMENT 400 (240 MG) MG PO TABS
400.0000 mg | ORAL_TABLET | Freq: Every day | ORAL | Status: DC
  Administered 2021-02-15: 400 mg via ORAL
  Filled 2021-02-14: qty 1

## 2021-02-14 MED ORDER — MYCOPHENOLATE MOFETIL 250 MG PO CAPS
750.0000 mg | ORAL_CAPSULE | Freq: Two times a day (BID) | ORAL | Status: DC
  Administered 2021-02-15 (×2): 750 mg via ORAL
  Filled 2021-02-14 (×4): qty 3

## 2021-02-14 MED ORDER — ACETAMINOPHEN 650 MG RE SUPP: 650.0000 mg | Freq: Four times a day (QID) | RECTAL | Status: DC | PRN

## 2021-02-14 MED ORDER — TACROLIMUS 0.5 MG PO CAPS
0.5000 mg | ORAL_CAPSULE | Freq: Two times a day (BID) | ORAL | Status: DC
  Administered 2021-02-15 (×2): 0.5 mg via ORAL
  Filled 2021-02-14 (×4): qty 1

## 2021-02-14 MED ORDER — AMIODARONE HCL IN DEXTROSE 360-4.14 MG/200ML-% IV SOLN
60.0000 mg/h | INTRAVENOUS | Status: DC
  Administered 2021-02-14 – 2021-02-15 (×2): 60 mg/h via INTRAVENOUS
  Filled 2021-02-14 (×2): qty 200

## 2021-02-14 MED ORDER — ACETAMINOPHEN 325 MG PO TABS: 650.0000 mg | ORAL_TABLET | Freq: Four times a day (QID) | ORAL | Status: DC | PRN

## 2021-02-14 MED ORDER — CLOPIDOGREL BISULFATE 75 MG PO TABS
75.0000 mg | ORAL_TABLET | Freq: Every day | ORAL | Status: DC
  Administered 2021-02-15: 75 mg via ORAL
  Filled 2021-02-14: qty 1

## 2021-02-14 MED ORDER — APIXABAN 5 MG PO TABS
5.0000 mg | ORAL_TABLET | Freq: Two times a day (BID) | ORAL | Status: DC
  Administered 2021-02-15 (×2): 5 mg via ORAL
  Filled 2021-02-14 (×2): qty 1

## 2021-02-14 MED ORDER — ALLOPURINOL 100 MG PO TABS
100.0000 mg | ORAL_TABLET | Freq: Every day | ORAL | Status: DC
  Administered 2021-02-15: 100 mg via ORAL
  Filled 2021-02-14: qty 1

## 2021-02-14 MED ORDER — MEXILETINE HCL 150 MG PO CAPS
300.0000 mg | ORAL_CAPSULE | Freq: Two times a day (BID) | ORAL | Status: DC
  Administered 2021-02-15 (×2): 300 mg via ORAL
  Filled 2021-02-14 (×4): qty 2

## 2021-02-14 MED ORDER — ATORVASTATIN CALCIUM 40 MG PO TABS
80.0000 mg | ORAL_TABLET | Freq: Every day | ORAL | Status: DC
  Administered 2021-02-15: 80 mg via ORAL
  Filled 2021-02-14: qty 2

## 2021-02-14 MED ORDER — FAMOTIDINE 20 MG PO TABS
40.0000 mg | ORAL_TABLET | Freq: Every day | ORAL | Status: DC
  Administered 2021-02-15: 40 mg via ORAL
  Filled 2021-02-14: qty 2

## 2021-02-14 MED ORDER — COLCHICINE 0.6 MG PO TABS
0.6000 mg | ORAL_TABLET | Freq: Every day | ORAL | Status: DC
  Administered 2021-02-15: 0.6 mg via ORAL
  Filled 2021-02-14: qty 1

## 2021-02-14 MED ORDER — MELATONIN 3 MG PO TABS
3.0000 mg | ORAL_TABLET | Freq: Every evening | ORAL | Status: DC | PRN
  Administered 2021-02-15: 3 mg via ORAL
  Filled 2021-02-14: qty 1

## 2021-02-14 NOTE — H&P (Addendum)
[facility] Admission History and Physical Service Pager: [phone]  Patient name: Roy Dyer Medical record number: [id number] Date of birth: Nov 11, 1965 Age: 55 y.o. Gender: male  Primary Care Provider: [name], MD Consultants: Cardiology Code Status: Full Code Preferred Emergency Contact: Wife, Thedford Bunton [phone]  Chief Complaint: Tachycardia to 150s, shortness of breath, diaphoresis  Assessment and Plan: Roy Dyer is a 55 y.o. male presenting with tachycardia with heart rate in the 150s, shortness of breath, diaphoresis in setting of newly diagnosed atrial flutter. 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 He contacted Cecilie Kicks, NP cardiology who discussed with Dr. Gardiner Rhyme and recommended that the patient take an additional $RemoveBefor'5mg'[id number]$  dose of Bystolic.This did not relieve symptoms within 1 hour, so he was recommended to come to the ED for further evaluation.  He denied chest pain, but did have mild chest pressure.  No shortness of breath, cough, fever, chills, loss of consciousness. Upon arrival to the ED waiting area, he had a rapid irregular heart rate in 150s bpm.  Cardiology evaluated the patient where he was deemed hemodynamically stable but tachycardic in atrial flutter and he was given amiodarone 150 mg bolus and initiated on amiodarone drip.  Heart rate came down appropriately to low 90-100s bpm.  No hypoxia, mildly tachypneic and lower 20s RR/minute.  ED provider discussed with on-call cardiology fellow with plan to continue on amiodarone drip with reassessment in the morning. Per my exam, he was very  well-appearing and endorsed a mild tension headache in the occipital region with some mild anxiety. He was recently admitted 09/20 -9/28 for chest pain radiating to the back and neck in which he was treated for concern for pericarditis and new onset atrial flutter seen on EKG.  Will continue current management with Amiodarone drip, and follow cardiology reccommendations.  -Admit to cardiac telemetry, attending Dr. Dorris Singh -Continuous cardiac monitoring -Continue IV amiodarone drip, per cardiology -Cardiology consulted and will follow and evaluate patient in the morning 10/5 with potential cardioversion. Appreciate their care and recommendations. -Monitor and replete electrolytes as necessary -Continue home cardiac regimen including: plavix, eliquis, mexiletine - VS per unit  - NPO in preparation for cardiac procedure   Acute on chronic combined CHF On exam, patient does not appear significantly volume overloaded. No JVD or significant lower extremity pitting edema. He reports experiencing some abdominal swelling with increased weight per his scale. He took one dose of home torsemide and Klor-con on 10/1 and 10/3 which improved symptoms and decreased weight. He reports a dry cough with"pink sputum" for past few weeks since last hospital discharge.  Pulmonary exam today unremarkable with no crackles.  Chest x-ray impression revealed minimal left basilar atelectasis/airspace disease with small left pleural effusion.  BNP of 1742.  Last BNP 9/28 1441.  Last echocardiogram 02/04/2021 ith EF of 30-35% with G3 DD.  Home medications: Spironolactone, Delene Loll, Farxiga,  Bystolic, Klor-Con and torsemide 20 mg as needed for heart failure symptoms -Continue home medications -Monitor I's/O closely -Daily weights  CAD with prior MI x2 and ischemic cardiomyopathy Patient with history of multiple stents to LAD, RCA and LCx.  Cath 12/10/22021 for worsening heart  failure symptoms with stable disease.  Troponins  38, will continue to trend.  EKG with no ST elevation or ischemic changes.  No concern for ACS at this time.  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  Follows with nephrology, Dr. Moshe Cipro and Dr. Lissa Merlin at [facility].  Patient has AV fistula on left arm since 2012.  Serum creatinine elevated on admission (2.06) above baseline (1.46-1.61). Home medications include tacrolimus, mycophenolate, prednisone. - AM consult for nephrology - Avoid nephrotoxic agents - Monitor kidney function closely with daily labs - Continue tacrolimus 0.5 mg daily - Continue mycophenolate 250 mg daily - Continue prednisone 5 mg daily  Dry Cough with Hemoptysis   Patient reports 1 week of dry cough with production of pink sputum, denies dyspnea or SOB. CXR on admission notable for small left pleural effusion, atelectasis, and cardiomegaly. Oxygen saturations within normal limits, >93%, on RA. Pt former smoker.  - consider repeat CXR prior to DC    Hyperlipidemia Chronic and stable.  Most recent lipid panel 06/2020.  Home medications include Lipitor 80 mg and niacin 500 mg daily -Continue Lipitor -Hold niacin due to high potential for arrhythmias.  Hypertension Chronic and stable.  BP 127/91 on admission.  Home meds include Entresto -Continue home medications  History of prediabetes, significantly elevated glucose Last A1c 6.1 on 02/04/2021.  Glucose 124 on admission -Monitor glucose with daily labs  GERD, with history of Barrett's esophagus EGD in 2017 confirmed Barrett's esophagus.  Home medication includes famotidine 40 mg at bedtime.  -Continue home medications  OSA Patient uses CPAP nightly. -Continue CPAP nightly  FEN/GI: Heart healthy diet Prophylaxis: Eliquis  Disposition: Admit to cardiac telemetry  [name]  History of Present Illness:  Roy Dyer is a 55 y.o. male presenting with palpitations.   Patient reports that he noticed feelings of his heart fluttering around 1730 today. His apple watch showed that he was in atrial fibrillation so he referred to his discharge information for further recommendations. He then called the after hours line and was instructed to take extra bystolic. He reports feeling increased anxiety and related SOB to that but does not feel that he was having a difficulty time breathing. He denies any syncope, nausea, emesis. He states that he had his AM medications as instructed on DC but has not had evening medications today. He reports that his appetite has been slightly decreased since discharge from the hospital. He reports taking Torsemide diuretic dose on 10/1 and 10/3 for CHF symptoms and increased weight, which both improved after medication.  He says he has had a dry cough with mild "pink" sputum production since 9/23 hospital admission.   He denies receiving HD. Reports that he sees both [facility] and [facility] for renal transplant 7 years ago. He also sees Dr. Haroldine Laws for cardiology.   The patient had question about stopping his PPI Dexilant at DC and wanted to know why it was recommended that he stop it. He reports having some heartburn symptoms.   Review Of Systems: Per HPI with the following additions:   Review of Systems  Constitutional:  Negative for chills and fever.  HENT:  Negative for rhinorrhea and sore throat.   Eyes:  Negative for pain.  Respiratory:  Positive for cough. Negative for shortness of breath and wheezing.  Pink sputum occasionally   Cardiovascular:  Positive for palpitations. Negative for chest pain and leg swelling.  Gastrointestinal:  Negative for anal bleeding, nausea and vomiting.  Endocrine: Negative for polyuria.  Genitourinary:  Negative for difficulty urinating and dysuria.  Musculoskeletal:  Negative for myalgias.   Skin:  Negative for rash and wound.  Neurological:  Positive for headaches. Negative for syncope, weakness and numbness.    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   Chronic renal insufficiency, stage II (mild)    Cough with hemoptysis 07/15/2016   Hemoptysis 07/15/2016   Barrett's esophagus 04/17/2016   Immunosuppression ([place]) 04/17/2016   GERD (gastroesophageal reflux disease) 12/31/2013   History of renal transplant 09/02/2013   History of V/Q scan 12/24/2011   S/P ICD (internal cardiac defibrillator) procedure 12/24/2011   Heart failure ([place]) 12/24/2011   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[place]) 12/24/2011   Coronary artery disease with exertional angina ([place]) 09/12/2011   Ulnar neuropathy of both upper extremities 07/30/2011   Raynaud's disease 06/25/2011   Automatic implantable cardioverter-defibrillator in situ 03/07/2011   Erectile dysfunction 10/23/2010   Mixed hyperlipidemia 08/10/2009   Essential hypertension, benign 08/10/2009   ischemic cardiomyopathy status post anterolateral MI 02/01/2009   LUPUS 02/01/2009    Past Medical History: Past Medical History:  Diagnosis Date   AICD (automatic cardioverter/defibrillator) present    Dr. Paschal Dopp follows remotely-yrly checks, Dr. Jonne Ply   Atrial flutter ([place]) 01/2021   dx'd when in [place] for pericarditis->amiodarone+eliquis   Avascular necrosis of bone of hip ([place]) 2010 surg   Left hip arthroplasty: from chronic systemic steroids taken for  his Lupus   Barrett's esophagus 2017; 2021   Blood transfusion without reported diagnosis 2010, 2012   CAD (coronary artery disease)    **DAPT long term recommended by cards**.  a. stents RCA/Circ 2001 b. BMS to LAD 07/2010  c. 01/2017: s/p DES to RCA.    Cataract    left eye   CHF (congestive heart failure) (HCC)    Chronic headaches 02/2017   As of 01/2017, HA's no better, neurologist ordered CT.  ESR normal at that time.  HA'S COMPLETELY RESOLVED AFTER HE GOT ON CPAP 2019/2020.   Chronic renal insufficiency, stage 3 (moderate) (HCC)    GFR 55-60 (sCr 1.4-1.7 range)   Erythrocytosis    Pre and post transplant->on losartan for this   GERD (gastroesophageal reflux disease)    Hx of esoph stricture and dilatation.  +Barrett's esophagus+hx of aspiration pneumonia-->to get rpt EGD when he comes off DAPT (utd as of 01/29/18)   Gout    s/p renal transplant he was weened off of his allopurinol.   Hiatal hernia    History of adenomatous polyp of colon 09/2019   polyp x 1. Recall 5 yrs   History of end stage renal disease 04/24/2011   Secondary to SLE: HD 06/2011-10/2012 (then got renal transplant)   History of renal transplant 11/10/2012   10/2012 [facility]    Hyperlipidemia  Hypertension    implantable cardiac defibrillator single chamber    Medtronic (due to low EF)   Ischemic cardiomyopathy    Chronic systolic dysfunction--  65/9935 EF 30-35%.   Single chamber ICD 11/20/10 ([name])   Left ventricular thrombus 2012   Re-eval 02/2011 showed thrombus RESOLVED, so coumadin was d/c'd (was on it for 60mo)   Lupus ([place])    OSA on CPAP 2018   Dr. Annamaria Boots 01/2017: +OSA on home sleep studay; CPAP auto titrate 5-20 started 05/2017.   Osteoporosis    Pericarditis associated with systemic lupus erythematosus ([place]) 01/2021   hosp->colchicine   Prediabetes 12/2019   A1c stable long term at 5.6-5.7 until jump up to 6.2% Aug 2021.    Past Surgical History: Past Surgical History:  Procedure Laterality Date    AV FISTULA PLACEMENT  03/28/2011   Procedure: ARTERIOVENOUS (AV) FISTULA CREATION;  Surgeon: Rosetta Posner, MD;  Location: [place];  Service: Vascular;  Laterality: Left;  Creation of Left radiocephallic cimino fistula   AV FISTULA PLACEMENT  04/27/2011   Procedure: ARTERIOVENOUS (AV) FISTULA CREATION;  Surgeon: Rosetta Posner, MD;  Location: [place];  Service: Vascular;  Laterality: Left;  left basilic vein transposition   BIOPSY  09/14/2019   Procedure: BIOPSY;  Surgeon: Jerene Bears, MD;  [facility]: [facility];  Service: Gastroenterology;;   CARDIAC CATHETERIZATION  08/2010; 04/22/20   08/2010; 04/2020->mild/mod in-stent restenosis but none hemodynamically signif, severe LV dysfxn, frequenct PVCs-->referred to HF clinic for optimization of med mgmt   CARDIOVASCULAR STRESS TEST  07/2010; 03/2014   03/2014 showed large old infarct and EF 30-35% but no ischemia   COLONOSCOPY  09/14/2019   Adenoma x 1.  Recall 5 yrs   COLONOSCOPY WITH PROPOFOL N/A 09/14/2019   Procedure: COLONOSCOPY WITH PROPOFOL;  Surgeon: Jerene Bears, MD;  [facility];  Service: Gastroenterology;  Laterality: N/A;   CORONARY PRESSURE WIRE/FFR WITH 3D MAPPING N/A 04/22/2020   Procedure: Coronary Pressure Wire/FFR w/3D Mapping;  Surgeon: Wellington Hampshire, MD;  Location: [place] [facility];  Service: Cardiovascular;  Laterality: N/A;   DEXA  07/05/2017   Normal bone density in radius and spine.   ESOPHAGOGASTRODUODENOSCOPY  02/2009   Done by Dr. Fuller Plan for hematemesis; esophagitis found.  Repeat recommended 09/2016, at which time he will also likely get his initial screening colonoscopy.   ESOPHAGOGASTRODUODENOSCOPY  09/14/2019   esophagitis (improved compared to 2017 EGD), Barrett's.  Stomach and duodenum normal.   ESOPHAGOGASTRODUODENOSCOPY (EGD) WITH PROPOFOL N/A 09/29/2015   REPEAT EGD RECOMMENDED 09/2016.  Barrett's esoph + mild chronic gastritis.  H pylori NEG. Procedure: ESOPHAGOGASTRODUODENOSCOPY (EGD) WITH  PROPOFOL;  Surgeon: Jerene Bears, MD;  [facility];  Service: Gastroenterology;  Laterality: N/A;   ESOPHAGOGASTRODUODENOSCOPY (EGD) WITH PROPOFOL N/A 09/14/2019   Procedure: ESOPHAGOGASTRODUODENOSCOPY (EGD) WITH PROPOFOL;  Surgeon: Jerene Bears, MD;  [facility];  Service: Gastroenterology;  Laterality: N/A;   ICD GENERATOR CHANGEOUT N/A 04/11/2018   Procedure: ICD GENERATOR CHANGEOUT;  Surgeon: Deboraha Sprang, MD;  Location: [place] [facility];  Service: Cardiovascular;  Laterality: N/A;   INSERT / REPLACE / REMOVE PACEMAKER     medtronic        dr [name]    [place]   icd only   KIDNEY TRANSPLANT  10/2012   [facility] nephrologist--Dr. Blair Heys   LEFT HEART CATH AND CORONARY ANGIOGRAPHY N/A 02/08/2017   PCA and DES to mRCA--needs DAPT for at least 6  mo.   Procedure: LEFT HEART CATH AND CORONARY ANGIOGRAPHY;  Surgeon: Sherren Mocha, MD;  Location: [place] [facility];  Service: Cardiovascular;  Laterality: N/A;   LEFT HEART CATH AND CORONARY ANGIOGRAPHY N/A 04/22/2020   Procedure: LEFT HEART CATH AND CORONARY ANGIOGRAPHY;  Surgeon: Wellington Hampshire, MD;  Location: [place] [facility];  Service: Cardiovascular;  Laterality: N/A;   PARTIAL HIP ARTHROPLASTY Left 2010   left   POLYPECTOMY  09/14/2019   Procedure: POLYPECTOMY;  Surgeon: Jerene Bears, MD;  [facility];  Service: Gastroenterology;;   RENAL BIOPSY     stents     05-2010 and 2- in 2010 and 1 in 2018.   TOTAL HIP ARTHROPLASTY  07/09/2011   Procedure: TOTAL HIP ARTHROPLASTY;  Surgeon: Kerin Salen, MD;  Location: [place];  Service: Orthopedics;  Laterality: Right;   TRANSTHORACIC ECHOCARDIOGRAM  10/13/2020   EF 30-35% (slight improved), grd III DD, elev PA pressure, mold/mod tricusp regurg   TYMPANOSTOMY TUBE PLACEMENT  55 yrs old   US ECHOCARDIOGRAPHY  12/2010; 02/2014;08/2014;08/2019   02/2014 EF still 25-30%, increased PA pressures.  2016 EF 30-35%, sept/inf hypokinesis, mod tricusp regurg. 08/2019  EF 25-30%, global hypok, sev dil LV w/apical aneur, grd III DD, mod tricus reg, mod pulm htn   ZIO PATCH  04/2020   HIGH PVC BURDEN.  NSVT, SVT, BBB.    Social History: Social History   Tobacco Use   Smoking status: Former    Packs/day: 0.50    Years: 30.00    Pack years: 15.00    Types: Cigarettes    Quit date: 08/02/2010    Years since quitting: 10.5   Smokeless tobacco: Never  Vaping Use   Vaping Use: Never used  Substance Use Topics   Alcohol use: No    Alcohol/week: 0.0 standard drinks   Drug use: No    Family History: Family History  Problem Relation Age of Onset   Kidney failure Mother    Lupus Mother    Stroke Mother    Diabetes Mother        type 2   Heart attack Father        X 7   Hypertension Father    Hyperlipidemia Father    Heart attack Sister 72       X 1   Hyperlipidemia Sister    Hypertension Sister    Heart disease Sister    Heart attack Paternal Grandfather    Heart disease Paternal Aunt    Heart disease Paternal Uncle    Colon cancer Neg Hx    Esophageal cancer Neg Hx    Stomach cancer Neg Hx    Rectal cancer Neg Hx     Allergies and Medications: Allergies  Allergen Reactions   Triptans Palpitations    Reaction to [place]   No current facility-administered medications on file prior to encounter.   Current Outpatient Medications on File Prior to Encounter  Medication Sig Dispense Refill   acetaminophen (TYLENOL) 500 MG tablet Take 1,000 mg by mouth every 6 (six) hours as needed (for pain.).     allopurinol (ZYLOPRIM) 100 MG tablet Take 1 tablet (100 mg total) by mouth daily. 90 tablet 3   amiodarone (PACERONE) 200 MG tablet Take 1 tablet (200 mg total) by mouth 2 (two) times daily. 60 tablet 0   apixaban (ELIQUIS) 5 MG TABS tablet Take 1 tablet (5 mg total) by mouth 2 (two) times daily. 60 tablet 0  atorvastatin (LIPITOR) 80 MG tablet Take 80 mg by mouth at bedtime.     clopidogrel (PLAVIX) 75 MG tablet TAKE 1 TABLET BY MOUTH DAILY  (Patient taking differently: Take 75 mg by mouth daily.) 90 tablet 3   colchicine 0.6 MG tablet Take 1 tablet (0.6 mg total) by mouth daily. 30 tablet 0   famotidine (PEPCID) 20 MG tablet Take 40 mg by mouth at bedtime.      fluticasone (FLONASE) 50 MCG/ACT nasal spray Place 1 spray into both nostrils daily.     magnesium oxide (MAG-OX) 400 MG tablet Take 400 mg by mouth daily.      melatonin 3 MG TABS tablet Take 1 tablet (3 mg total) by mouth at bedtime as needed.  0   mexiletine (MEXITIL) 150 MG capsule Take 2 capsules (300 mg total) by mouth 2 (two) times daily. 360 capsule 3   mycophenolate (CELLCEPT) 250 MG capsule Take 3 capsules (750 mg total) by mouth 2 (two) times daily. 540 capsule 1   nitroGLYCERIN (NITROSTAT) 0.4 MG SL tablet Place 1 tablet (0.4 mg total) under the tongue every 5 (five) minutes as needed for chest pain (DO NOT EXCEED 3 DOSES). 25 tablet 3   potassium chloride SA (KLOR-CON) 20 MEQ tablet Take 1 tablet (20 mEq total) by mouth as needed (for heart failure symptoms per Dr. Jeffie Pollock). 30 tablet 0   predniSONE (DELTASONE) 5 MG tablet TAKE 1 TABLET BY MOUTH DAILY (Patient taking differently: Take 5 mg by mouth daily with breakfast.) 90 tablet 3   tacrolimus (PROGRAF) 0.5 MG capsule Take 0.5 mg by mouth every 12 (twelve) hours.      torsemide (DEMADEX) 20 MG tablet Take 1 tablet (20 mg total) by mouth as needed (for heart failure symptoms per Dr. Jeffie Pollock). 30 tablet 0   traMADol (ULTRAM) 50 MG tablet Take 50 mg by mouth every 6 (six) hours as needed. for pain  0    Objective: BP 121/86   Pulse (!) 123   Temp 98.3 F (36.8 C) (Oral)   Resp 19   SpO2 97%   Exam: General: Well-appearing, pleasant, in no distress Eyes: EOMI, PERRLA, no scleral icterus ENTM: MMM, no pharyngeal exudate or erythema Neck: No JVD. Neck supple, ROM normal Cardiovascular: Regularly irregular rhythm with HR in 90s. No lower extremity edema. Respiratory: Clear to auscultation in all lung  fields. No wheezing or crackles. Gastrointestinal: Abdomen soft, non-tender, non-distended MSK: No joint deformities. Patent AV fistula in L arm with palpable thrill. Derm: Warm, dry, well-perfused.  Neuro: No focal neurologic deficits Psych: Normal mood and affect. Answers questions appropriately.  Labs and Imaging: CBC BMET  Recent Labs  Lab 02/14/21 2032  WBC 10.2  HGB 15.0  HCT 45.9  PLT 313   Recent Labs  Lab 02/14/21 2032  NA 138  K 4.5  CL 100  CO2 27  BUN 23*  CREATININE 2.06*  GLUCOSE 124*  CALCIUM 9.2     EKG: Atrial flutter, 120 bpm, no ST elevations, no QT interval prolongation  Orvis Brill, DO 02/14/2021, 10:37 PM PGY-1, [place] Intern pager: [phone], text pages welcome   [name] Upper-Level Resident Addendum   I have independently interviewed and examined the patient. I have discussed the above with Dr.Dameron and agree with the documented plan. My edits for correction/addition/clarification are included above. Please see any attending notes.   Eulis Foster, MD PGY-3, [place] [facility] 02/15/2021 2:51 AM  [name] Service pager: [phone] (  text pages welcome through [facility])

## 2021-02-14 NOTE — ED Triage Notes (Signed)
Pt states that about 530 his heart began to race, hx of the a flutter, pt SOB speaking in short sentences and diaphoretic.

## 2021-02-14 NOTE — ED Notes (Signed)
Restricted arm band placed on L arm d/t dialysis fistula.

## 2021-02-14 NOTE — Telephone Encounter (Signed)
Pt called after discharge on the 28th.  He had a flutter then, and discharged on his eliquis and amiodarone 200 mg BID.  His bystolic was held due to soft BP, now HR  009 and systolic BP 233.  Discussed with Dr. Gardiner Rhyme and will ask him to take one of his bystolic 5 mg.  If no improvement in an hour or so to come to ER   he is on Mexiletine as well.    No chest pain, no SOB just aware of the pounding.  Pt agreeable to this approach.

## 2021-02-14 NOTE — ED Provider Notes (Signed)
I was called to triage to evaluate this patient due to concern for shortness of breath, diaphoresis and a flutter.  He has a known EF of 35% and a complex cardiac history including a recent admission for pericarditis and new A. fib.  He is currently anticoagulated with Xarelto and Plavix and symptoms began at 5:30 PM.  There is currently not a bed available for him in the emergency department and thus we will start amiodarone while he waits in the lobby.  There is a high chance he will require cardioversion but I will defer this decision to his primary ER provider when a room becomes available.  On my initial evaluation, patient is hemodynamically stable but tachycardic in the 150s.  No active chest pain or diaphoresis seen on initial exam.   Teressa Lower, MD 02/14/21 2050

## 2021-02-14 NOTE — ED Provider Notes (Addendum)
[facility] EMERGENCY DEPARTMENT Provider Note   CSN: [id number] Arrival date & time: 02/14/21  2009     History Chief Complaint  Patient presents with   Atrial Fibrillation    Roy Dyer is a 55 y.o. male.  Patient told to come in by cardiology.  Followed by Dr. Britta Mccreedy.  Patient known to have combined congestive heart failure.  Has an ejection fraction around 30%.  Also has a renal transplant patient.  And patient with recent admission.  Patient was admitted on September 23 and was discharged on September 28.  And patient was admitted that time for pericarditis secondary to his lupus.  Coronary artery disease.  Does have a day fibrillator in place AICD.  And as mentioned has end-stage renal disease with renal transplant also hypertension and prediabetic.  Today patient felt his heart rate was going extremely fast.  Around 150.  Cardiology recommended they take some additional Bystolic.  That did not help.  So they told him to come in.  Patient with some chest discomfort.  But no significant shortness of breath.  No severe chest pain.  Just sort of some discomfort.      Past Medical History:  Diagnosis Date   AICD (automatic cardioverter/defibrillator) present    Dr. Paschal Dopp follows remotely-yrly checks, Dr. Jonne Ply   Atrial flutter ([place]) 01/2021   dx'd when in [place] for pericarditis->amiodarone+eliquis   Avascular necrosis of bone of hip ([place]) 2010 surg   Left hip arthroplasty: from chronic systemic steroids taken for his Lupus   Barrett's esophagus 2017; 2021   Blood transfusion without reported diagnosis 2010, 2012   CAD (coronary artery disease)    **DAPT long term recommended by cards**.  a. stents RCA/Circ 2001 b. BMS to LAD 07/2010  c. 01/2017: s/p DES to RCA.    Cataract    left eye   CHF (congestive heart failure) (HCC)    Chronic headaches 02/2017   As of 01/2017, HA's no better, neurologist ordered CT.  ESR normal at that time.  HA'S COMPLETELY  RESOLVED AFTER HE GOT ON CPAP 2019/2020.   Chronic renal insufficiency, stage 3 (moderate) (HCC)    GFR 55-60 (sCr 1.4-1.7 range)   Erythrocytosis    Pre and post transplant->on losartan for this   GERD (gastroesophageal reflux disease)    Hx of esoph stricture and dilatation.  +Barrett's esophagus+hx of aspiration pneumonia-->to get rpt EGD when he comes off DAPT (utd as of 01/29/18)   Gout    s/p renal transplant he was weened off of his allopurinol.   Hiatal hernia    History of adenomatous polyp of colon 09/2019   polyp x 1. Recall 5 yrs   History of end stage renal disease 04/24/2011   Secondary to SLE: HD 06/2011-10/2012 (then got renal transplant)   History of renal transplant 11/10/2012   10/2012 [facility]    Hyperlipidemia    Hypertension    implantable cardiac defibrillator single chamber    Medtronic (due to low EF)   Ischemic cardiomyopathy    Chronic systolic dysfunction--  88/9169 EF 30-35%.   Single chamber ICD 11/20/10 ([name])   Left ventricular thrombus 2012   Re-eval 02/2011 showed thrombus RESOLVED, so coumadin was d/c'd (was on it for 63mo   [place]    OSA on CPAP 2018   Dr. [name]9/2018: +OSA on home sleep studay; CPAP auto titrate 5-20 started 05/2017.   Osteoporosis    Pericarditis associated with systemic lupus  erythematosus ([place]) 01/2021   hosp->colchicine   Prediabetes 12/2019   A1c stable long term at 5.6-5.7 until jump up to 6.2% Aug 2021.    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    Chronic renal insufficiency, stage II (mild)    Cough with hemoptysis 07/15/2016   Hemoptysis 07/15/2016   Barrett's esophagus 04/17/2016   Immunosuppression ([place]) 04/17/2016   GERD (gastroesophageal reflux disease) 12/31/2013   History of renal transplant 09/02/2013   History of V/Q scan 12/24/2011   S/P ICD (internal cardiac defibrillator) procedure 12/24/2011   Heart failure ([place]) 12/24/2011   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[place]) 12/24/2011   Coronary artery disease with exertional angina ([place]) 09/12/2011   Ulnar neuropathy of both upper extremities 07/30/2011   Raynaud's disease 06/25/2011   Automatic implantable cardioverter-defibrillator in situ 03/07/2011   Erectile dysfunction 10/23/2010   Mixed hyperlipidemia 08/10/2009   Essential hypertension, benign 08/10/2009   ischemic cardiomyopathy status post anterolateral MI 02/01/2009   LUPUS 02/01/2009    Past Surgical History:  Procedure Laterality Date   AV FISTULA PLACEMENT  03/28/2011   Procedure: ARTERIOVENOUS (AV) FISTULA CREATION;  Surgeon: Rosetta Posner, MD;  Location: [place];  Service: Vascular;  Laterality: Left;  Creation of Left radiocephallic cimino fistula   AV FISTULA PLACEMENT  04/27/2011   Procedure: ARTERIOVENOUS (AV) FISTULA CREATION;  Surgeon: Rosetta Posner, MD;  Location: [place];  Service: Vascular;  Laterality: Left;  left basilic vein transposition   BIOPSY  09/14/2019   Procedure: BIOPSY;  Surgeon: Jerene Bears, MD;  Location: [facility];  Service: Gastroenterology;;   CARDIAC CATHETERIZATION  08/2010; 04/22/20   08/2010; 04/2020->mild/mod in-stent restenosis but none hemodynamically signif, severe LV dysfxn, frequenct PVCs-->referred to HF clinic for optimization of med mgmt   CARDIOVASCULAR STRESS TEST  07/2010; 03/2014   03/2014 showed large old infarct and EF 30-35% but no ischemia   COLONOSCOPY  09/14/2019   [place] x 1.  Recall 5 yrs   COLONOSCOPY WITH PROPOFOL N/A 09/14/2019   Procedure:  COLONOSCOPY WITH PROPOFOL;  Surgeon: Jerene Bears, MD;  [facility]: [facility];  Service: Gastroenterology;  Laterality: N/A;   CORONARY PRESSURE WIRE/FFR WITH 3D MAPPING N/A 04/22/2020   Procedure: Coronary Pressure Wire/FFR w/3D Mapping;  Surgeon: Wellington Hampshire, MD;  Location: [place] [facility];  Service: Cardiovascular;  Laterality: N/A;   DEXA  07/05/2017   Normal bone density in radius and spine.   ESOPHAGOGASTRODUODENOSCOPY  02/2009   Done by Dr. Fuller Plan for hematemesis; esophagitis found.  Repeat recommended 09/2016, at which time he will also likely get his initial screening colonoscopy.   ESOPHAGOGASTRODUODENOSCOPY  09/14/2019   esophagitis (improved compared to 2017 EGD), Barrett's.  Stomach and duodenum normal.   ESOPHAGOGASTRODUODENOSCOPY (EGD) WITH PROPOFOL N/A 09/29/2015   REPEAT EGD RECOMMENDED 09/2016.  Barrett's esoph + mild chronic gastritis.  H pylori NEG. Procedure: ESOPHAGOGASTRODUODENOSCOPY (EGD) WITH PROPOFOL;  Surgeon: Jerene Bears, MD;  [facility];  Service:  Gastroenterology;  Laterality: N/A;   ESOPHAGOGASTRODUODENOSCOPY (EGD) WITH PROPOFOL N/A 09/14/2019   Procedure: ESOPHAGOGASTRODUODENOSCOPY (EGD) WITH PROPOFOL;  Surgeon: Jerene Bears, MD;  [facility];  Service: Gastroenterology;  Laterality: N/A;   ICD GENERATOR CHANGEOUT N/A 04/11/2018   Procedure: ICD GENERATOR CHANGEOUT;  Surgeon: Deboraha Sprang, MD;  Location: [place] [facility];  Service: Cardiovascular;  Laterality: N/A;   INSERT / REPLACE / REMOVE PACEMAKER     medtronic        dr [name]    [place]   icd only   KIDNEY TRANSPLANT  10/2012   [facility] nephrologist--Dr. Blair Heys   LEFT HEART CATH AND CORONARY ANGIOGRAPHY N/A 02/08/2017   PCA and DES to mRCA--needs DAPT for at least 6 mo.   Procedure: LEFT HEART CATH AND CORONARY ANGIOGRAPHY;  Surgeon: Sherren Mocha, MD;  Location: [place] [facility];  Service: Cardiovascular;  Laterality: N/A;   LEFT HEART CATH AND CORONARY  ANGIOGRAPHY N/A 04/22/2020   Procedure: LEFT HEART CATH AND CORONARY ANGIOGRAPHY;  Surgeon: Wellington Hampshire, MD;  Location: [place] [facility];  Service: Cardiovascular;  Laterality: N/A;   PARTIAL HIP ARTHROPLASTY Left 2010   left   POLYPECTOMY  09/14/2019   Procedure: POLYPECTOMY;  Surgeon: Jerene Bears, MD;  [facility];  Service: Gastroenterology;;   RENAL BIOPSY     stents     05-2010 and 2- in 2010 and 1 in 2018.   TOTAL HIP ARTHROPLASTY  07/09/2011   Procedure: TOTAL HIP ARTHROPLASTY;  Surgeon: Kerin Salen, MD;  Location: [place];  Service: Orthopedics;  Laterality: Right;   TRANSTHORACIC ECHOCARDIOGRAM  10/13/2020   EF 30-35% (slight improved), grd III DD, elev PA pressure, mold/mod tricusp regurg   TYMPANOSTOMY TUBE PLACEMENT  55 yrs old   US ECHOCARDIOGRAPHY  12/2010; 02/2014;08/2014;08/2019   02/2014 EF still 25-30%, increased PA pressures.  2016 EF 30-35%, sept/inf hypokinesis, mod tricusp regurg. 08/2019 EF 25-30%, global hypok, sev dil LV w/apical aneur, grd III DD, mod tricus reg, mod pulm htn   ZIO PATCH  04/2020   HIGH PVC BURDEN.  NSVT, SVT, BBB.       Family History  Problem Relation Age of Onset   Kidney failure Mother    Lupus Mother    Stroke Mother    Diabetes Mother        type 2   Heart attack Father        X 7   Hypertension Father    Hyperlipidemia Father    Heart attack Sister 58       X 1   Hyperlipidemia Sister    Hypertension Sister    Heart disease Sister    Heart attack Paternal Grandfather    Heart disease Paternal Aunt    Heart disease Paternal Uncle    Colon cancer Neg Hx    Esophageal cancer Neg Hx    Stomach cancer Neg Hx    Rectal cancer Neg Hx     Social History   Tobacco Use   Smoking status: Former    Packs/day: 0.50    Years: 30.00    Pack years: 15.00    Types: Cigarettes    Quit date: 08/02/2010    Years since quitting: 10.5   Smokeless tobacco: Never  Vaping Use   Vaping Use: Never used  Substance Use  Topics   Alcohol use: No    Alcohol/week: 0.0 standard drinks   Drug use: No  Home Medications Prior to Admission medications   Medication Sig Start Date End Date Taking? Authorizing Provider  acetaminophen (TYLENOL) 500 MG tablet Take 1,000 mg by mouth every 6 (six) hours as needed (for pain.).    [provider]  allopurinol (ZYLOPRIM) 100 MG tablet Take 1 tablet (100 mg total) by mouth daily. 03/22/16   McGowen, [name], MD  amiodarone (PACERONE) 200 MG tablet Take 1 tablet (200 mg total) by mouth 2 (two) times daily. 02/08/21 03/10/21  Donney Dice, DO  apixaban (ELIQUIS) 5 MG TABS tablet Take 1 tablet (5 mg total) by mouth 2 (two) times daily. 02/08/21 03/10/21  Ganta, Anupa, DO  atorvastatin (LIPITOR) 80 MG tablet Take 80 mg by mouth at bedtime.    [provider]  clopidogrel (PLAVIX) 75 MG tablet TAKE 1 TABLET BY MOUTH DAILY Patient taking differently: Take 75 mg by mouth daily. 08/16/16   McGowen, [name], MD  colchicine 0.6 MG tablet Take 1 tablet (0.6 mg total) by mouth daily. 02/09/21 03/11/21  Ganta, Anupa, DO  famotidine (PEPCID) 20 MG tablet Take 40 mg by mouth at bedtime.     [provider]  fluticasone (FLONASE) 50 MCG/ACT nasal spray Place 1 spray into both nostrils daily.    [provider]  magnesium oxide (MAG-OX) 400 MG tablet Take 400 mg by mouth daily.     [provider]  melatonin 3 MG TABS tablet Take 1 tablet (3 mg total) by mouth at bedtime as needed. 02/08/21   Ganta, Anupa, DO  mexiletine (MEXITIL) 150 MG capsule Take 2 capsules (300 mg total) by mouth 2 (two) times daily. 09/23/20   Bensimhon, Shaune Pascal, MD  mycophenolate (CELLCEPT) 250 MG capsule Take 3 capsules (750 mg total) by mouth 2 (two) times daily. 12/23/16   McGowen, [name], MD  nitroGLYCERIN (NITROSTAT) 0.4 MG SL tablet Place 1 tablet (0.4 mg total) under the tongue every 5 (five) minutes as needed for chest pain (DO NOT EXCEED 3 DOSES). 11/23/19   Josue Hector, MD  potassium chloride SA (KLOR-CON) 20 MEQ tablet Take 1 tablet (20 mEq total) by mouth as needed (for heart failure symptoms per Dr. Jeffie Pollock). 02/08/21 03/10/21  Ganta, Anupa, DO  predniSONE (DELTASONE) 5 MG tablet TAKE 1 TABLET BY MOUTH DAILY Patient taking differently: Take 5 mg by mouth daily with breakfast. 10/24/16   McGowen, [name], MD  tacrolimus (PROGRAF) 0.5 MG capsule Take 0.5 mg by mouth every 12 (twelve) hours.     [provider]  torsemide (DEMADEX) 20 MG tablet Take 1 tablet (20 mg total) by mouth as needed (for heart failure symptoms per Dr. Jeffie Pollock). 02/08/21 03/10/21  Donney Dice, DO  traMADol (ULTRAM) 50 MG tablet Take 50 mg by mouth every 6 (six) hours as needed. for pain 06/06/17   [provider]    Allergies    Triptans  Review of Systems   Review of Systems  Constitutional:  Negative for chills and fever.  HENT:  Negative for congestion, ear pain and sore throat.   Eyes:  Negative for pain and visual disturbance.  Respiratory:  Negative for cough and shortness of breath.   Cardiovascular:  Positive for chest pain and palpitations.  Gastrointestinal:  Negative for abdominal pain and vomiting.  Genitourinary:  Negative for dysuria and hematuria.  Musculoskeletal:  Negative for arthralgias and back pain.  Skin:  Negative for color change and rash.  Neurological:  Negative for seizures and syncope.  All  other systems reviewed and are negative.  Physical Exam Updated Vital Signs BP 121/86   Pulse (!) 123   Temp 98.3 F (36.8 C) (Oral)   Resp 19   SpO2 97%   Physical Exam Vitals and nursing note reviewed.  Constitutional:      General: He is in acute distress.     Appearance: Normal appearance. He is well-developed.  HENT:     Head: Normocephalic and atraumatic.  Eyes:     Extraocular Movements: Extraocular movements intact.     Conjunctiva/sclera: Conjunctivae normal.     Pupils: Pupils are equal, round, and reactive to  light.  Cardiovascular:     Rate and Rhythm: Tachycardia present. Rhythm irregular.     Heart sounds: No murmur heard. Pulmonary:     Effort: Pulmonary effort is normal. No respiratory distress.     Breath sounds: No wheezing or rales.  Abdominal:     Palpations: Abdomen is soft.     Tenderness: There is no abdominal tenderness.  Musculoskeletal:        General: No swelling. Normal range of motion.     Cervical back: Normal range of motion and neck supple.  Skin:    General: Skin is warm and dry.     Capillary Refill: Capillary refill takes less than 2 seconds.  Neurological:     General: No focal deficit present.     Mental Status: He is alert and oriented to person, place, and time.    ED Results / Procedures / Treatments   Labs (all labs ordered are listed, but only abnormal results are displayed) Labs Reviewed  BASIC METABOLIC PANEL - Abnormal; Notable for the following components:      Result Value   Glucose, Bld 124 (*)    BUN 23 (*)    Creatinine, Ser 2.06 (*)    GFR, Estimated 38 (*)    All other components within normal limits  BRAIN NATRIURETIC PEPTIDE - Abnormal; Notable for the following components:   B Natriuretic Peptide 1,742.3 (*)    All other components within normal limits  TROPONIN I (HIGH SENSITIVITY) - Abnormal; Notable for the following components:   Troponin I (High Sensitivity) 38 (*)    All other components within normal limits  RESP PANEL BY RT-PCR (FLU A&B, COVID) ARPGX2  CBC  TROPONIN I (HIGH SENSITIVITY)    EKG EKG Interpretation  Date/Time:  Tuesday February 14 2021 21:24:55 EDT Ventricular Rate:  120 PR Interval:    QRS Duration: 107 QT Interval:  354 QTC Calculation: 501 R Axis:   -79 Text Interpretation: Atrial flutter with predominant 2:1 AV block Anterolateral infarct, age indeterminate Prolonged QT interval Confirmed by Fredia Sorrow [phone]) on 02/14/2021 9:43:07 PM  Radiology DG Chest Portable 1 View  Result Date:  02/14/2021 CLINICAL DATA:  Chest pain. EXAM: PORTABLE CHEST 1 VIEW COMPARISON:  Chest x-ray 02/05/2021. FINDINGS: Right-sided pacemaker is unchanged in position. The cardiac silhouette is markedly enlarged, unchanged. There is some patchy opacities in the left lung base with likely small left pleural effusion. There is central pulmonary vascular congestion. There is no pneumothorax or acute fracture. IMPRESSION: Stable marked cardiomegaly with central pulmonary vascular congestion. Minimal left basilar atelectasis/airspace disease with small left pleural effusion. Electronically Signed   By: Ronney Asters M.D.   On: 02/14/2021 22:00    Procedures Procedures   Medications Ordered in ED Medications  amiodarone (NEXTERONE) 1.8 mg/mL load via infusion 150 mg (150 mg Intravenous Bolus from Bag 02/14/21  2109)    Followed by  amiodarone (NEXTERONE PREMIX) 360-4.14 MG/200ML-% (1.8 mg/mL) IV infusion (60 mg/hr Intravenous New Bag/Given 02/14/21 2125)    Followed by  amiodarone (NEXTERONE PREMIX) 360-4.14 MG/200ML-% (1.8 mg/mL) IV infusion (has no administration in time range)    ED Course  I have reviewed the triage vital signs and the nursing notes.  Pertinent labs & imaging results that were available during my care of the patient were reviewed by me and considered in my medical decision making (see chart for details).    MDM Rules/Calculators/A&P                         CRITICAL CARE Performed by: Fredia Sorrow Total critical care time: 60 minutes Critical care time was exclusive of separately billable procedures and treating other patients. Critical care was necessary to treat or prevent imminent or life-threatening deterioration. Critical care was time spent personally by me on the following activities: development of treatment plan with patient and/or surrogate as well as nursing, discussions with consultants, evaluation of patient's response to treatment, examination of patient, obtaining  history from patient or surrogate, ordering and performing treatments and interventions, ordering and review of laboratory studies, ordering and review of radiographic studies, pulse oximetry and re-evaluation of patient's condition.   Patient with rapid irregular heart rate around 120 when he came back.  Patient was screened out in triage.  Patient without any significant chest pain.  Patient feeling better with the heart rate being a little slower.  Just discharged on the 28th for similar concerns.  Patient is on Eliquis.  Option for cardioversion.  But since he was out in triage and there was no bed available he was started on amiodarone 150 mg bolus.  And then a drip.  He responded well to this.  Brought his heart rate down into the low 100s now is around 97.  Discussed with on-call cardiology fellow.  Plan is to keep him on the drip they will reassess in the morning.  And may cardiovert at that time.  Patient's initial troponin was 38 chest x-ray with maybe with a small pleural effusion no distinct opacities.  No distinct evidence of congestive heart failure.  But patient's BNP is elevated.  Patient's renal function is baseline.  Blood sugar 124.  Patient was admitted by the family medicine service on the last go around.  And recontacted them and they will readmit and cardiology will consult. Final Clinical Impression(s) / ED Diagnoses Final diagnoses:  Atrial flutter with rapid ventricular response [facility])    Rx / DC Orders ED Discharge Orders     None        Fredia Sorrow, MD 02/14/21 [id number]    Fredia Sorrow, MD 02/14/21 2320

## 2021-02-14 NOTE — ED Notes (Signed)
Received verbal report from Paden B RN at this time 

## 2021-02-15 ENCOUNTER — Other Ambulatory Visit: Payer: Self-pay

## 2021-02-15 DIAGNOSIS — I480 Paroxysmal atrial fibrillation: Secondary | ICD-10-CM

## 2021-02-15 DIAGNOSIS — I4892 Unspecified atrial flutter: Secondary | ICD-10-CM | POA: Diagnosis not present

## 2021-02-15 DIAGNOSIS — I4891 Unspecified atrial fibrillation: Secondary | ICD-10-CM | POA: Diagnosis not present

## 2021-02-15 DIAGNOSIS — I309 Acute pericarditis, unspecified: Secondary | ICD-10-CM | POA: Diagnosis not present

## 2021-02-15 DIAGNOSIS — I5022 Chronic systolic (congestive) heart failure: Secondary | ICD-10-CM

## 2021-02-15 DIAGNOSIS — I319 Disease of pericardium, unspecified: Secondary | ICD-10-CM

## 2021-02-15 DIAGNOSIS — N1831 Chronic kidney disease, stage 3a: Secondary | ICD-10-CM

## 2021-02-15 DIAGNOSIS — N179 Acute kidney failure, unspecified: Secondary | ICD-10-CM | POA: Insufficient documentation

## 2021-02-15 DIAGNOSIS — I2510 Atherosclerotic heart disease of native coronary artery without angina pectoris: Secondary | ICD-10-CM

## 2021-02-15 LAB — BASIC METABOLIC PANEL
Anion gap: 10 (ref 5–15)
BUN: 22 mg/dL — ABNORMAL HIGH (ref 6–20)
CO2: 27 mmol/L (ref 22–32)
Calcium: 8.9 mg/dL (ref 8.9–10.3)
Chloride: 100 mmol/L (ref 98–111)
Creatinine, Ser: 1.88 mg/dL — ABNORMAL HIGH (ref 0.61–1.24)
GFR, Estimated: 42 mL/min — ABNORMAL LOW (ref >60)
Glucose, Bld: 116 mg/dL — ABNORMAL HIGH (ref 70–99)
Potassium: 3.9 mmol/L (ref 3.5–5.1)
Sodium: 137 mmol/L (ref 135–145)

## 2021-02-15 LAB — TROPONIN I (HIGH SENSITIVITY)
Troponin I (High Sensitivity): 45 ng/L — ABNORMAL HIGH (ref <18)
Troponin I (High Sensitivity): 46 ng/L — ABNORMAL HIGH (ref <18)
Troponin I (High Sensitivity): 43 ng/L — ABNORMAL HIGH (ref <18)

## 2021-02-15 LAB — MAGNESIUM: Magnesium: 1.8 mg/dL (ref 1.7–2.4)

## 2021-02-15 MED ORDER — DAPAGLIFLOZIN PROPANEDIOL 10 MG PO TABS: 10.0000 mg | ORAL_TABLET | Freq: Every day | ORAL | 0 refills | Status: DC

## 2021-02-15 MED ORDER — DAPAGLIFLOZIN PROPANEDIOL 10 MG PO TABS
10.0000 mg | ORAL_TABLET | Freq: Every day | ORAL | Status: DC
  Filled 2021-02-15: qty 1

## 2021-02-15 MED ORDER — POTASSIUM CHLORIDE CRYS ER 20 MEQ PO TBCR
20.0000 meq | EXTENDED_RELEASE_TABLET | Freq: Once | ORAL | Status: AC
  Administered 2021-02-15: 20 meq via ORAL
  Filled 2021-02-15: qty 1

## 2021-02-15 NOTE — ED Notes (Signed)
Pt c/o pain at second PIV site. Attempted to flush line and noted it had infiltrated. PIV d/c and started a new line

## 2021-02-15 NOTE — ED Notes (Signed)
Provider at bedside

## 2021-02-15 NOTE — ED Notes (Signed)
DC instructions reviewed with pt. PT verbalized understanding. Pt DC °

## 2021-02-15 NOTE — Consult Note (Addendum)
ELECTROPHYSIOLOGY CONSULT NOTE    Patient ID: PROCTOR CARRIKER MRN: [id number], DOB/AGE: 54-19-1967 54 y.o.  Admit date: 02/14/2021 Date of Consult: 02/15/2021  Primary Physician: [name], MD Primary Cardiologist: [name], MD  Electrophysiologist: [name]  Referring Provider: Dr. Haroldine Laws  Patient Profile: Roy Dyer is a 55 y.o. male with a history of atrial flutter, pleuritic chest pain, ICM, CAD s/p multiple stents to LAD, RCA and LCX, CKD who is being seen today for the evaluation of Atrial fib/flutter at the request of Dr. Haroldine Laws.  HPI:  Roy Dyer is a 55 y.o. male with medical history as above.   Recently admitted 01/2021 with new atrial and acute pericarditis type symptoms. He converted to NSR w/ amiodarone and treated with colchicine. Echo with no effusion, EF 30-35%, RV normal.  Pt presented to [facility] overnight with HRs in the 150s by his apple watch. He denies SOB or CP. Started on amiodarone and converted overnight.   Pertinent labs on admission include Cr 2.06, K 4.5, WBC 10.2, Hgb 15.0, BNP 1742. HS-Trop 38 -> 45 -> 46 -> 43  Currently feeling better. He converted on Amio gtt to NSR/SB 50-60s. Denies dyspnea currently. He reports compliance of home amio and eliquis. Compliant with CPAP as well. Remains on colchicine without further pleuritic type CP.   Past Medical History:  Diagnosis Date   AICD (automatic cardioverter/defibrillator) present    Dr. Paschal Dopp follows remotely-yrly checks, Dr. Jonne Ply   Atrial flutter ([place]) 01/2021   dx'd when in [place] for pericarditis->amiodarone+eliquis   Avascular necrosis of bone of hip ([place]) 2010 surg   Left hip arthroplasty: from chronic systemic steroids taken for his Lupus   Barrett's esophagus 2017; 2021   Blood transfusion without reported diagnosis 2010, 2012   CAD (coronary artery disease)    **DAPT long term recommended by cards**.  a. stents RCA/Circ 2001 b. BMS to LAD 07/2010  c. 01/2017: s/p  DES to RCA.    Cataract    left eye   CHF (congestive heart failure) (HCC)    Chronic headaches 02/2017   As of 01/2017, HA's no better, neurologist ordered CT.  ESR normal at that time.  HA'S COMPLETELY RESOLVED AFTER HE GOT ON CPAP 2019/2020.   Chronic renal insufficiency, stage 3 (moderate) (HCC)    GFR 55-60 (sCr 1.4-1.7 range)   Erythrocytosis    Pre and post transplant->on losartan for this   GERD (gastroesophageal reflux disease)    Hx of esoph stricture and dilatation.  +Barrett's esophagus+hx of aspiration pneumonia-->to get rpt EGD when he comes off DAPT (utd as of 01/29/18)   Gout    s/p renal transplant he was weened off of his allopurinol.   Hiatal hernia    History of adenomatous polyp of colon 09/2019   polyp x 1. Recall 5 yrs   History of end stage renal disease 04/24/2011   Secondary to SLE: HD 06/2011-10/2012 (then got renal transplant)   History of renal transplant 11/10/2012   10/2012 [place]    Hyperlipidemia    Hypertension    implantable cardiac defibrillator single chamber    Medtronic (due to low EF)   Ischemic cardiomyopathy    Chronic systolic dysfunction--  04/1974 EF 30-35%.   Single chamber ICD 11/20/10 ([name])   Left ventricular thrombus 2012   Re-eval 02/2011 showed thrombus RESOLVED, so coumadin was d/c'd (was on it for 48mo   Lupus ([facility]    OSA on CPAP 2018  Dr. Annamaria Boots 01/2017: +OSA on home sleep studay; CPAP auto titrate 5-20 started 05/2017.   Osteoporosis    Pericarditis associated with systemic lupus erythematosus ([place]) 01/2021   hosp->colchicine   Prediabetes 12/2019   A1c stable long term at 5.6-5.7 until jump up to 6.2% Aug 2021.     Surgical History:  Past Surgical History:  Procedure Laterality Date   AV FISTULA PLACEMENT  03/28/2011   Procedure: ARTERIOVENOUS (AV) FISTULA CREATION;  Surgeon: Rosetta Posner, MD;  Location: [facility];  Service: Vascular;  Laterality: Left;  Creation of Left radiocephallic cimino fistula   AV FISTULA PLACEMENT   04/27/2011   Procedure: ARTERIOVENOUS (AV) FISTULA CREATION;  Surgeon: Rosetta Posner, MD;  Location: [facility];  Service: Vascular;  Laterality: Left;  left basilic vein transposition   BIOPSY  09/14/2019   Procedure: BIOPSY;  Surgeon: Jerene Bears, MD;  [facility];  Service: Gastroenterology;;   CARDIAC CATHETERIZATION  08/2010; 04/22/20   08/2010; 04/2020->mild/mod in-stent restenosis but none hemodynamically signif, severe LV dysfxn, frequenct PVCs-->referred to HF clinic for optimization of med mgmt   CARDIOVASCULAR STRESS TEST  07/2010; 03/2014   03/2014 showed large old infarct and EF 30-35% but no ischemia   COLONOSCOPY  09/14/2019   [place] x 1.  Recall 5 yrs   COLONOSCOPY WITH PROPOFOL N/A 09/14/2019   Procedure: COLONOSCOPY WITH PROPOFOL;  Surgeon: Jerene Bears, MD;  [facility];  Service: Gastroenterology;  Laterality: N/A;   CORONARY PRESSURE WIRE/FFR WITH 3D MAPPING N/A 04/22/2020   Procedure: Coronary Pressure Wire/FFR w/3D Mapping;  Surgeon: Wellington Hampshire, MD;  Location: [facility];  Service: Cardiovascular;  Laterality: N/A;   DEXA  07/05/2017   Normal bone density in radius and spine.   ESOPHAGOGASTRODUODENOSCOPY  02/2009   Done by Dr. Fuller Plan for hematemesis; esophagitis found.  Repeat recommended 09/2016, at which time he Kalima Saylor also likely get his initial screening colonoscopy.   ESOPHAGOGASTRODUODENOSCOPY  09/14/2019   esophagitis (improved compared to 2017 EGD), Barrett's.  Stomach and duodenum normal.   ESOPHAGOGASTRODUODENOSCOPY (EGD) WITH PROPOFOL N/A 09/29/2015   REPEAT EGD RECOMMENDED 09/2016.  Barrett's esoph + mild chronic gastritis.  H pylori NEG. Procedure: ESOPHAGOGASTRODUODENOSCOPY (EGD) WITH PROPOFOL;  Surgeon: Jerene Bears, MD;  [facility];  Service: Gastroenterology;  Laterality: N/A;   ESOPHAGOGASTRODUODENOSCOPY (EGD) WITH PROPOFOL N/A 09/14/2019   Procedure: ESOPHAGOGASTRODUODENOSCOPY (EGD) WITH PROPOFOL;  Surgeon:  Jerene Bears, MD;  [facility];  Service: Gastroenterology;  Laterality: N/A;   ICD GENERATOR CHANGEOUT N/A 04/11/2018   Procedure: ICD GENERATOR CHANGEOUT;  Surgeon: Deboraha Sprang, MD;  Location: [facility];  Service: Cardiovascular;  Laterality: N/A;   INSERT / REPLACE / REMOVE PACEMAKER     medtronic        dr [name]    [place]   icd only   KIDNEY TRANSPLANT  10/2012   [facility] nephrologist--Dr. Blair Heys   LEFT HEART CATH AND CORONARY ANGIOGRAPHY N/A 02/08/2017   PCA and DES to mRCA--needs DAPT for at least 6 mo.   Procedure: LEFT HEART CATH AND CORONARY ANGIOGRAPHY;  Surgeon: Sherren Mocha, MD;  Location: [facility];  Service: Cardiovascular;  Laterality: N/A;   LEFT HEART CATH AND CORONARY ANGIOGRAPHY N/A 04/22/2020   Procedure: LEFT HEART CATH AND CORONARY ANGIOGRAPHY;  Surgeon: Wellington Hampshire, MD;  Location: [facility];  Service: Cardiovascular;  Laterality: N/A;   PARTIAL HIP ARTHROPLASTY Left 2010  left   POLYPECTOMY  09/14/2019   Procedure: POLYPECTOMY;  Surgeon: Jerene Bears, MD;  [facility];  Service: Gastroenterology;;   RENAL BIOPSY     stents     05-2010 and 2- in 2010 and 1 in 2018.   TOTAL HIP ARTHROPLASTY  07/09/2011   Procedure: TOTAL HIP ARTHROPLASTY;  Surgeon: Kerin Salen, MD;  Location: [facility];  Service: Orthopedics;  Laterality: Right;   TRANSTHORACIC ECHOCARDIOGRAM  10/13/2020   EF 30-35% (slight improved), grd III DD, elev PA pressure, mold/mod tricusp regurg   TYMPANOSTOMY TUBE PLACEMENT  55 yrs old   US ECHOCARDIOGRAPHY  12/2010; 02/2014;08/2014;08/2019   02/2014 EF still 25-30%, increased PA pressures.  2016 EF 30-35%, sept/inf hypokinesis, mod tricusp regurg. 08/2019 EF 25-30%, global hypok, sev dil LV w/apical aneur, grd III DD, mod tricus reg, mod pulm htn   ZIO PATCH  04/2020   HIGH PVC BURDEN.  NSVT, SVT, BBB.     (Not in a hospital admission)   Inpatient Medications:   allopurinol  100 mg Oral  Daily   apixaban  5 mg Oral BID   atorvastatin  80 mg Oral QHS   clopidogrel  75 mg Oral Daily   colchicine  0.6 mg Oral Daily   famotidine  40 mg Oral QHS   magnesium oxide  400 mg Oral Daily   mexiletine  300 mg Oral BID   mycophenolate  750 mg Oral BID   predniSONE  5 mg Oral Daily   tacrolimus  0.5 mg Oral Q12H    Allergies:  Allergies  Allergen Reactions   Triptans Palpitations    Reaction to [place]    Social History   Socioeconomic History   Marital status: Married    Spouse name: Not on file   Number of children: 2   Years of education: Not on file   Highest education level: Not on file  Occupational History   Occupation: CAD Drafter     Employer: [employer]  Tobacco Use   Smoking status: Former    Packs/day: 0.50    Years: 30.00    Pack years: 15.00    Types: Cigarettes    Quit date: 08/02/2010    Years since quitting: 10.5   Smokeless tobacco: Never  Vaping Use   Vaping Use: Never used  Substance and Sexual Activity   Alcohol use: No    Alcohol/week: 0.0 standard drinks   Drug use: No   Sexual activity: Yes    Partners: Female  Other Topics Concern   Not on file  Social History Narrative   Married, one adult daughter and one adult son.    Occupation: Printmaker.1   5 pack-yr smoking hx, quit 07/2010.   Drug Use - noNo alcohol.   Social Determinants of Health   Financial Resource Strain: Low Risk    Difficulty of Paying Living Expenses: Not very hard  Food Insecurity: No Food Insecurity   Worried About Charity fundraiser in the Last Year: Never true   Ran Out of Food in the Last Year: Never true  Transportation Needs: No Transportation Needs   Lack of Transportation (Medical): No   Lack of Transportation (Non-Medical): No  Physical Activity: Not on file  Stress: Not on file  Social Connections: Not on file  Intimate Partner Violence: Not on file     Family History  Problem Relation Age of Onset   Kidney failure Mother    Lupus  Mother  Stroke Mother    Diabetes Mother        type 2   Heart attack Father        X 7   Hypertension Father    Hyperlipidemia Father    Heart attack Sister 81       X 1   Hyperlipidemia Sister    Hypertension Sister    Heart disease Sister    Heart attack Paternal Grandfather    Heart disease Paternal Aunt    Heart disease Paternal Uncle    Colon cancer Neg Hx    Esophageal cancer Neg Hx    Stomach cancer Neg Hx    Rectal cancer Neg Hx      Review of Systems: All other systems reviewed and are otherwise negative except as noted above.  Physical Exam: Vitals:   02/15/21 1000 02/15/21 1030 02/15/21 1100 02/15/21 1130  BP: 125/70 130/76 122/74 134/81  Pulse: (!) 59 (!) 59 (!) 59 (!) 53  Resp: (!) 25 (!) 26 19 19   Temp:      TempSrc:      SpO2: 96% 98% 96% 94%  Weight:      Height:        GEN- The patient is well appearing, alert and oriented x 3 today.   HEENT: normocephalic, atraumatic; sclera clear, conjunctiva pink; hearing intact; oropharynx clear; neck supple Lungs- Clear to ausculation bilaterally, normal work of breathing.  No wheezes, rales, rhonchi Heart- Regular rate and rhythm, no murmurs, rubs or gallops GI- soft, non-tender, non-distended, bowel sounds present Extremities- no clubbing, cyanosis, or edema; DP/PT/radial pulses 2+ bilaterally MS- no significant deformity or atrophy Skin- warm and dry, no rash or lesion Psych- euthymic mood, full affect Neuro- strength and sensation are intact  Labs:   Lab Results  Component Value Date   WBC 10.2 02/14/2021   HGB 15.0 02/14/2021   HCT 45.9 02/14/2021   MCV 89.3 02/14/2021   PLT 313 02/14/2021    Recent Labs  Lab 02/15/21 0045  NA 137  K 3.9  CL 100  CO2 27  BUN 22*  CREATININE 1.88*  CALCIUM 8.9  GLUCOSE 116*      Radiology/Studies: DG Chest 1 View  Result Date: 02/05/2021 CLINICAL DATA:  Chest pain. EXAM: CHEST  1 VIEW COMPARISON:  02/04/2021 FINDINGS: Cardiac silhouette is mildly  enlarged. Stable right anterior chest wall AICD. Clear lungs.  No pleural effusion or pneumothorax. Skeletal structures are grossly intact. IMPRESSION: No acute cardiopulmonary disease. Electronically Signed   By: Lajean Manes M.D.   On: 02/05/2021 14:35   DG Chest 2 View  Result Date: 02/04/2021 CLINICAL DATA:  Chest discomfort EXAM: CHEST - 2 VIEW COMPARISON:  02/03/2021 FINDINGS: Right chest wall ICD noted with lead in the right ventricle. Mild cardiac enlargement, stable. No pleural effusion or edema. No airspace densities. IMPRESSION: No active cardiopulmonary abnormalities. Electronically Signed   By: Kerby Moors M.D.   On: 02/04/2021 09:03   NM Pulmonary Perfusion  Result Date: 02/05/2021 CLINICAL DATA:  Hx of CAD, AICD, Combined CHF, CKD III S/P renal transplant, HTN, HLD, Lupus, OSA o CPAP, PreDM presented with B/L chest pain radiating to his neck and back with a dry cough that is intermittent. EXAM: NUCLEAR MEDICINE PERFUSION LUNG SCAN TECHNIQUE: Perfusion images were obtained in multiple projections after intravenous injection of radiopharmaceutical. Ventilation scans intentionally deferred if perfusion scan and chest x-ray adequate for interpretation during COVID 19 epidemic. RADIOPHARMACEUTICALS:  3.75 mCi Tc-36mMAA IV COMPARISON:  Current chest radiograph FINDINGS: No segmental perfusion defects to suggest pulmonary thromboemboli. Focal defect overlies the peripheral right lung from the patient's anterior chest wall AICD. Patchy relative decreased perfusion to the posterior aspect of the right upper lobe and apical segment of the right lower lobe. IMPRESSION: No evidence of a pulmonary thromboembolism. Electronically Signed   By: Lajean Manes M.D.   On: 02/05/2021 14:38   US Renal Transplant w/Doppler  Result Date: 02/06/2021 CLINICAL DATA:  Renal transplant 8 years ago, elevated creatinine EXAM: ULTRASOUND OF RENAL TRANSPLANT WITH RENAL DOPPLER ULTRASOUND TECHNIQUE: Ultrasound  examination of the renal transplant was performed with gray-scale, color and duplex doppler evaluation. COMPARISON:  None. FINDINGS: Transplant kidney location: RLQ Transplant Kidney: Renal measurements: 10.7 x 6.2 x 6.6 cm = volume: 226.59m. Normal in size and parenchymal echogenicity. No evidence of mass or hydronephrosis. No peri-transplant fluid collection seen. Color flow in the main renal artery:  Yes Color flow in the main renal vein:  Yes Duplex Doppler Evaluation: Main Renal Artery Velocity: 141 cm/sec Main Renal Artery Resistive Index: 0.9 Venous waveform in main renal vein:  Present Intrarenal resistive index in upper pole:  0.6 (normal 0.6-0.8; equivocal 0.8-0.9; abnormal >= 0.9) Intrarenal resistive index in lower pole: 0.7 (normal 0.6-0.8; equivocal 0.8-0.9; abnormal >= 0.9) Bladder: Normal for degree of bladder distention. Other findings:  None. IMPRESSION: 1. Unremarkable right lower quadrant renal transplant. Electronically Signed   By: MRanda NgoM.D.   On: 02/06/2021 20:00   DG Chest Portable 1 View  Result Date: 02/14/2021 CLINICAL DATA:  Chest pain. EXAM: PORTABLE CHEST 1 VIEW COMPARISON:  Chest x-ray 02/05/2021. FINDINGS: Right-sided pacemaker is unchanged in position. The cardiac silhouette is markedly enlarged, unchanged. There is some patchy opacities in the left lung base with likely small left pleural effusion. There is central pulmonary vascular congestion. There is no pneumothorax or acute fracture. IMPRESSION: Stable marked cardiomegaly with central pulmonary vascular congestion. Minimal left basilar atelectasis/airspace disease with small left pleural effusion. Electronically Signed   By: ARonney AstersM.D.   On: 02/14/2021 22:00   DG Chest Portable 1 View  Result Date: 02/03/2021 CLINICAL DATA:  Chest pain. EXAM: PORTABLE CHEST 1 VIEW COMPARISON:  05/18/2017. FINDINGS: Similar enlarged cardiac silhouette. Similar right subclavian approach single lead cardiac rhythm  maintenance device. Mild left lateral basilar opacity. No definite pleural effusions. No visible pneumothorax. No acute osseous abnormality. IMPRESSION: 1. Mild left lateral lung base opacity, which could represent atelectasis or pneumonia. Dedicated PA and lateral radiographs could better characterize if clinically indicated. 2. Similar cardiomegaly. Electronically Signed   By: FMargaretha SheffieldM.D.   On: 02/03/2021 13:11   ECHOCARDIOGRAM COMPLETE  Result Date: 02/04/2021    ECHOCARDIOGRAM REPORT   Patient Name:   TKOLE HILYARDDate of Exam: 02/04/2021 Medical Rec #:  [id number]   Height:       67.0 in Accession #:    [id number]  Weight:       215.0 lb Date of Birth:  104-09-1965   BSA:          2.085 m Patient Age:    89years     BP:           116/80 mmHg Patient Gender: M            HR:           74 bpm. Exam Location:  Inpatient Procedure: 2D Echo, Cardiac Doppler and Color Doppler Indications:  CHF  History:        Patient has prior history of Echocardiogram examinations, most                 recent 10/13/2020. CHF and Cardiomyopathy, Previous Myocardial                 Infarction and CAD, Defibrillator, Arrythmias:Atrial Flutter;                 Signs/Symptoms:Chest Pain. Post op renal transplant.  Sonographer:    Dustin Flock RDCS Referring Phys: [id number] DANIEL R BENSIMHON IMPRESSIONS  1. Left ventricular ejection fraction, by estimation, is 30 to 35%. The left ventricle has moderately decreased function. The left ventricle demonstrates global hypokinesis. The left ventricular internal cavity size was moderately dilated. There is mild  left ventricular hypertrophy. Left ventricular diastolic parameters are consistent with Grade II diastolic dysfunction (pseudonormalization).  2. Right ventricular systolic function is normal. The right ventricular size is normal. There is moderately elevated pulmonary artery systolic pressure. The estimated right ventricular systolic pressure is 32.4 mmHg.  3. Left  atrial size was severely dilated.  4. Right atrial size was severely dilated.  5. The mitral valve is normal in structure. Trivial mitral valve regurgitation. No evidence of mitral stenosis.  6. Tricuspid valve regurgitation is moderate.  7. The aortic valve is normal in structure. Aortic valve regurgitation is not visualized. Mild aortic valve sclerosis is present, with no evidence of aortic valve stenosis.  8. The inferior vena cava is dilated in size with <50% respiratory variability, suggesting right atrial pressure of 15 mmHg. Comparison(s): No significant change from prior study. Prior images reviewed side by side. FINDINGS  Left Ventricle: Left ventricular ejection fraction, by estimation, is 30 to 35%. The left ventricle has moderately decreased function. The left ventricle demonstrates global hypokinesis. The left ventricular internal cavity size was moderately dilated. There is mild left ventricular hypertrophy. Left ventricular diastolic parameters are consistent with Grade II diastolic dysfunction (pseudonormalization). Right Ventricle: The right ventricular size is normal. No increase in right ventricular wall thickness. Right ventricular systolic function is normal. There is moderately elevated pulmonary artery systolic pressure. The tricuspid regurgitant velocity is 3.20 m/s, and with an assumed right atrial pressure of 15 mmHg, the estimated right ventricular systolic pressure is 40.1 mmHg. Left Atrium: Left atrial size was severely dilated. Right Atrium: Right atrial size was severely dilated. Pericardium: There is no evidence of pericardial effusion. Mitral Valve: The mitral valve is normal in structure. Trivial mitral valve regurgitation. No evidence of mitral valve stenosis. Tricuspid Valve: The tricuspid valve is normal in structure. Tricuspid valve regurgitation is moderate . No evidence of tricuspid stenosis. Aortic Valve: The aortic valve is normal in structure. Aortic valve regurgitation is  not visualized. Mild aortic valve sclerosis is present, with no evidence of aortic valve stenosis. Pulmonic Valve: The pulmonic valve was normal in structure. Pulmonic valve regurgitation is not visualized. No evidence of pulmonic stenosis. Aorta: The aortic root is normal in size and structure. Venous: The inferior vena cava is dilated in size with less than 50% respiratory variability, suggesting right atrial pressure of 15 mmHg. IAS/Shunts: No atrial level shunt detected by color flow Doppler. Additional Comments: A device lead is visualized in the right ventricle.  LEFT VENTRICLE PLAX 2D LVIDd:         6.60 cm      Diastology LVIDs:         5.60 cm  LV e' medial:    7.83 cm/s LV PW:         1.30 cm      LV E/e' medial:  11.2 LV IVS:        1.40 cm      LV e' lateral:   9.25 cm/s LVOT diam:     2.40 cm      LV E/e' lateral: 9.5 LV SV:         93 LV SV Index:   45 LVOT Area:     4.52 cm  LV Volumes (MOD) LV vol d, MOD A4C: 216.0 ml LV vol s, MOD A4C: 134.0 ml LV SV MOD A4C:     216.0 ml RIGHT VENTRICLE RV Basal diam:  3.50 cm RV S prime:     12.20 cm/s TAPSE (M-mode): 2.8 cm LEFT ATRIUM              Index       RIGHT ATRIUM           Index LA diam:        5.50 cm  2.64 cm/m  RA Area:     20.30 cm LA Vol (A2C):   101.0 ml 48.43 ml/m RA Volume:   59.90 ml  28.72 ml/m LA Vol (A4C):   100.0 ml 47.95 ml/m LA Biplane Vol: 105.0 ml 50.35 ml/m  AORTIC VALVE LVOT Vmax:   111.00 cm/s LVOT Vmean:  73.700 cm/s LVOT VTI:    0.206 m  AORTA Ao Root diam: 3.30 cm MITRAL VALVE               TRICUSPID VALVE MV Area (PHT): 5.97 cm    TR Peak grad:   41.0 mmHg MV Decel Time: 127 msec    TR Vmax:        320.00 cm/s MV E velocity: 87.50 cm/s MV A velocity: 30.00 cm/s  SHUNTS MV E/A ratio:  2.92        Systemic VTI:  0.21 m                            Systemic Diam: 2.40 cm Candee Furbish MD Electronically signed by Candee Furbish MD Signature Date/Time: 02/04/2021/3:22:25 PM    Final     EKG: EKG 10/5 shows Afib at 120 bpm  (personally reviewed) EKG 02/03/21 shows Atrial flutter at 110 bpm  TELEMETRY: EKG 10/4 most likely shows atrial fibrillation (personally reviewed)  MDT single chamber ICD implanted 2012 for ICM , gen change 03/2018 History of appropriate therapy: No History of AAD therapy: Amiodarone for AF    Assessment/Plan: 1.  Paroxysmal atrial fibrillation / atrial flutter Recent EKGs show both coarse atrial fibrillation and atrial flutter Has been on amiodarone (and mexitil) for PVCs EP asked to see for ablation consideration. Pt has both coarse AF and flutter present on EKGs LVEF 30-35% by Echo 02/04/21 with LA diam 5.50 cm  2. PVCs/NSVT Monitor in May with 11.6% PVCs Monitor in August/Sept with 11.8% PVCs Has since been started on amiodarone. Previously treated with mexiletine alone.  3. ICM s/p ICD Echo 02/04/21 LVEF 30-35%  4. CAD  H/o stents to LAD, RCA, and LCX Denies s/s ischemia HS-trop 45->46->43  5. CKDIII Cr 1.88 on arrival which is near baseline.  6. Pericarditis Would not recommend ablation while being treated for pericarditis as the odds of exacerbating would be quite high.   Discussed with patient. He has HF  and LAE, both which decrease odds of success. He also remains on colchicine for recent pericarditis. Our recommendation is to continue to treat his pericarditis and optimize his GDMT where possible. Would suppress atrial arrhythmias (as well as PVCs) with amiodarone, and follow up as outpatient to re-assess and further discuss candidacy and likelihood of success of ablation.  He verbalizes understanding and agrees to the plan.   Have discussed above with Dr. Curt Bears who is to see patient prior to d/c from ED.   For questions or updates, please contact [facility] Please consult [email or web address] for contact info under Cardiology/STEMI.  Signed, Shirley Friar, PA-C  02/15/2021 11:39 AM   I have seen and examined this patient with Oda Kilts.  Agree with  above, note added to reflect my findings.  On exam, RRR, no murmurs. Patient presented to the ER with palpitations, found to have AF with RVR and HR in the 150s.  Has an ischemic cardiomyopathy and is s/p Medtronic ICD. Has converted to sinus rhythm on amiodarone. Would continue amiodarone. Would discharge from the ER on 400 mg BID for 2 weeks followed by 200 mg daily. For now, he is somewhat poor candidate for AF ablation due to his LA size (5.5cm). If he can stay sinus rhythm he could have positive remodeling and thus would be a better candidate. Shritha Bresee arrange follow up in EP clinic as an outpatient to discuss further.  Nayah Lukens M. Shantell Belongia MD 02/15/2021 2:54 PM

## 2021-02-15 NOTE — Progress Notes (Signed)
[name] Interim Progress Note  S: Reported to bedside to evaluate patient due to HR <60. Patient denies chest pain, SOB, reports feeling much better than when he initially presented to ED.   O: BP 121/76   Pulse (!) 57   Temp 98.3 F (36.8 C) (Oral)   Resp 15   Ht 5\' 7"  (1.702 m)   Wt 93.9 kg   SpO2 96%   BMI 32.42 kg/m   General: male appearing stated age in no acute distress, alert and sitting upright in bed  Cardio: Rhythm is regular.  Bilateral radial pulses palpable Pulm: Clear to auscultation bilaterally, no crackles, wheezing, or diminished breath sounds. Normal respiratory effort, stable on RA     A/P: 55 y.o. male admitted for atrial flutter on amiodarone gtt overnight, HR improved and most recently bradycardic.  DC amiodarone gtt, rhythm regular  Await further recommendations/interventions with cardiology  Repeat EKG ordered for this AM   Eulis Foster, MD 02/15/2021, 6:54 AM PGY-3, [place] [facility] pager [phone]

## 2021-02-15 NOTE — Progress Notes (Addendum)
Advanced Heart Failure Rounding Note  PCP-Cardiologist: [name], MD  AHF: Dr. Haroldine Laws   Patient Profile   55 y/o male w/ chronic systolic heart failure 2/2 ICM, CAD, Stage IIIa CKD s/p renal transplant in 2012 2/2 SLE w/ recent admit 9/22 for new atrial flutter and acute pericarditis, converted to NSR w/ amio and treated w/ colchicine. Echo no effusion, EF 30-35%, RV normal.  Now readmitted w/ recurrent, symptomatic, atrial flutter w/ RVR.   Subjective:    V-rates in 150s on arrival. Placed on amio gtt. Converted to NSR/SB upper 50s-low 60s. BP normotensive.   K 4.5>>3.9 Mg 1.8   PO K and Mg supp given   HS trop 38>>45>>46>>43  SCr 2.1>>1.88 (baseline ~1.5).  Feels better. Denies dyspnea. Reports full compliance w/ amio and eliquis at home. Compliant w/ CPAP. Continues w/ colchicine. Denies any further pleuritic CP.     Objective:   Weight Range: 93.9 kg Body mass index is 32.42 kg/m.   Vital Signs:   Temp:  [98.3 F (36.8 C)] 98.3 F (36.8 C) (10/04 2026) Pulse Rate:  [30-153] 58 (10/05 0730) Resp:  [15-32] 23 (10/05 0730) BP: (104-127)/(66-91) 125/73 (10/05 0730) SpO2:  [92 %-98 %] 95 % (10/05 0730) Weight:  [93.9 kg] 93.9 kg (10/05 0029)    Weight change: Filed Weights   02/15/21 0029  Weight: 93.9 kg    Intake/Output:   Intake/Output Summary (Last 24 hours) at 02/15/2021 1045 Last data filed at 02/15/2021 0304 Gross per 24 hour  Intake 180.14 ml  Output --  Net 180.14 ml      Physical Exam    General:  Well appearing. No resp difficulty HEENT: Normal Neck: Supple. No JVP . Carotids 2+ bilat; no bruits. No lymphadenopathy or thyromegaly appreciated. Cor: PMI nondisplaced. Regular rate & rhythm. No rubs, gallops or murmurs. Lungs: Clear Abdomen: Soft, nontender, nondistended. No hepatosplenomegaly. No bruits or masses. Good bowel sounds. Extremities: No cyanosis, clubbing, rash, edema Neuro: Alert & orientedx3, cranial nerves grossly  intact. moves all 4 extremities w/o difficulty. Affect pleasant   Telemetry   NSR/SB upper 50s-low 60s   EKG    Admit EKG: 3:1 Atrial flutter, HR 120  (reported up to 150s)   Repeat EKG, post conversion on amio gtt, NSR 61 bpm 1st degree HB    Labs    CBC Recent Labs    02/14/21 2032  WBC 10.2  HGB 15.0  HCT 45.9  MCV 89.3  PLT 716   Basic Metabolic Panel Recent Labs    02/14/21 2032 02/15/21 0045 02/15/21 0616  NA 138 137  --   K 4.5 3.9  --   CL 100 100  --   CO2 27 27  --   GLUCOSE 124* 116*  --   BUN 23* 22*  --   CREATININE 2.06* 1.88*  --   CALCIUM 9.2 8.9  --   MG  --   --  1.8   Liver Function Tests No results for input(s): AST, ALT, ALKPHOS, BILITOT, PROT, ALBUMIN in the last 72 hours. No results for input(s): LIPASE, AMYLASE in the last 72 hours. Cardiac Enzymes No results for input(s): CKTOTAL, CKMB, CKMBINDEX, TROPONINI in the last 72 hours.  BNP: BNP (last 3 results) Recent Labs    09/07/20 1047 02/03/21 1329 02/14/21 2032  BNP 792.8* 1,441.7* 1,742.3*    ProBNP (last 3 results) No results for input(s): PROBNP in the last 8760 hours.   D-Dimer No results for  input(s): DDIMER in the last 72 hours. Hemoglobin A1C No results for input(s): HGBA1C in the last 72 hours. Fasting Lipid Panel No results for input(s): CHOL, HDL, LDLCALC, TRIG, CHOLHDL, LDLDIRECT in the last 72 hours. Thyroid Function Tests No results for input(s): TSH, T4TOTAL, T3FREE, THYROIDAB in the last 72 hours.  Invalid input(s): FREET3  Other results:   Imaging    DG Chest Portable 1 View  Result Date: 02/14/2021 CLINICAL DATA:  Chest pain. EXAM: PORTABLE CHEST 1 VIEW COMPARISON:  Chest x-ray 02/05/2021. FINDINGS: Right-sided pacemaker is unchanged in position. The cardiac silhouette is markedly enlarged, unchanged. There is some patchy opacities in the left lung base with likely small left pleural effusion. There is central pulmonary vascular congestion.  There is no pneumothorax or acute fracture. IMPRESSION: Stable marked cardiomegaly with central pulmonary vascular congestion. Minimal left basilar atelectasis/airspace disease with small left pleural effusion. Electronically Signed   By: Ronney Asters M.D.   On: 02/14/2021 22:00     Medications:     Scheduled Medications:  allopurinol  100 mg Oral Daily   apixaban  5 mg Oral BID   atorvastatin  80 mg Oral QHS   clopidogrel  75 mg Oral Daily   colchicine  0.6 mg Oral Daily   famotidine  40 mg Oral QHS   magnesium oxide  400 mg Oral Daily   mexiletine  300 mg Oral BID   mycophenolate  750 mg Oral BID   potassium chloride  20 mEq Oral Once   predniSONE  5 mg Oral Daily   tacrolimus  0.5 mg Oral Q12H    Infusions:   PRN Medications: acetaminophen **OR** acetaminophen, melatonin  Assessment/Plan   1. Recurrent Atrial Flutter w/ RVR - recent admit for new AFL 9/22, converted on amio gtt - now w/ breakthrough AFL despite compliance w/ PO amio, 200 mg bid. V-rates 150s on admit - converted back to NSR/SB on amio gtt - K 3.9, Mg 1.8. Supp given  - Continue Eliquis 5 mg bid - Amio 200 mg bid  - Will need EP evaluation for potential AFL ablation, though rhythm control may be difficult given severely dilated LA. Recent echo measured 5.50 cm - Fully compliant w/ CPAP   2. Chronic Systolic Heart Failure  - HX ICD in 2012 - Echo 12/17 EF 30%  - Cath 04/22/20 for worsening HF symptoms. Stable CAD. - Echo 6/22 EF 30-35%, Grade III DD, severe LAE, mild/mod TR - Echo 02/04/21: EF 30-35% No effusion, RV normal  - NYHA Class II, euvolemic on exam   - Entresto, spiro and farxiga held recent hospital d/c due to AKI. SCr improving  - Restart Farxiga 10 mg daily  - Bystolic held due to bradycardia  - Monitor volume   3. Pericarditis  - diagnosed previous admit 9/22. Echo showed no effusion  - symptoms improved w/ colchicine. Continue 0.6 mg daily x 3 months. No NSAIDs as he is post renal  tx   4. CKD 3a s/p renal transplant in 2012 (due to SLE): - Follows with nephrology, Dr. Clover Mealy and Dr. Lissa Merlin at [facility]. - Baseline Creatinine 1.4-1.6 - Recent AKI w/ rise to 2.0, 1.88 tpday  - Continue to hold diuretics, Mearl Latin and Farxiga  - Avoid hypotension. Hold Bystolic  - Continue prednisone, Prograf and CellCept.  5.  CAD with prior MI x2 and ischemic cardiomyopathy - Multiple stents to LAD, RCA and LCX - Cath 04/22/20 for worsening HF symptoms with stable disease - HS  troponin flat Does not appear to be ACS. ECG nonischemic. - No ASA given Eliquis  - continue Plavix  - On statin  - holding ? blocker w/ soft BP and bradycardia   6.  Hypomagnesemia - Mg 1.8 on admit, given 400 mg PO Mg - K 3.9, KCl given  - F/u Mg level, keep > 2.0. May need IV Mg if PO supp not adequate   7. PVCs - Zio 12/21 13.4% PVC burden on Zio with runs of SVT & NSVT. - PVCs reduced on mexilitene 300 mg bid.  - Zio (5/22): PVC 11.6 % burden - Zio (8/22): PVC 11.8% burden - Better suppressed w/ amio and mexiletine. - Continue CPAP QHS  - Keep K > 4.0 and Mg > 2.0   Ok for d/c home from HF standpoint after EP evaluates. D/w EP, they will arrange for AFL ablation in 4 weeks.   Cardiac Meds for Discharge Eliquis 5 mg bid Amiodarone 200 mg bid  Atorvastatin 80 mg daily  Plavix 75 mg daily  Colchicine 0.6 mg daily  Farxiga 10 mg daily (restart) Mexiletine 300 mg bid  We will arrange post hospital f/u in the [facility] in 2-3 weeks and will place appt info in AVS   Length of Stay: [place], PA-C  02/15/2021, 10:45 AM  Advanced Heart Failure Team Pager [phone] (M-F; 7a - 5p)  Please contact [facility] for night-coverage after hours (5p -7a ) and weekends on [email or web address]  Patient seen and examined with the above-signed Advanced Practice Provider and/or Housestaff. I personally reviewed laboratory data, imaging studies and relevant notes. I independently examined the  patient and formulated the important aspects of the plan. I have edited the note to reflect any of my changes or salient points. I have personally discussed the plan with the patient and/or family.  55 y/o male with h/o kidney transplant, 2/2 SLE, CAD, PAF, systolic HF due to iCM recently admitted with pericarditis and AFL.   Has been doing well for 1 week. Presented to ER last night with recurrent AFL with RVR and SOB/palpitations. Started on IV amio and now back in NSR after 12 hours.   General:  Well appearing. No resp difficulty HEENT: normal Neck: supple. no JVD. Carotids 2+ bilat; no bruits. No lymphadenopathy or thryomegaly appreciated. Cor: PMI nondisplaced. Regular rate & rhythm. No rubs, gallops or murmurs. Lungs: clear Abdomen: soft, nontender, nondistended. No hepatosplenomegaly. No bruits or masses. Good bowel sounds. Extremities: no cyanosis, clubbing, rash, edema Neuro: alert & orientedx3, cranial nerves grossly intact. moves all 4 extremities w/o difficulty. Affect pleasant  Volume status looks good. He is back in NSR with IV amio. Can switch back to po amio. Have asked EP to see him to weigh in on AF/AFL ablation as outpatient. I think he can go home from ER. D/w FTPS who will facilitate d/c home from ER.   We will restart Wilder Glade as well.   [name], MD  3:06 PM

## 2021-02-15 NOTE — Progress Notes (Signed)
Patient declined CPAP use at this time. States he will bring his equipment from home if needed.Patient aware to call for Respiratory if needed.

## 2021-02-15 NOTE — Discharge Summary (Addendum)
Roy Dyer Discharge Summary  Patient name: Roy Dyer Medical record number: [id number] Date of birth: 04-17-1966 Age: 55 y.o. Gender: male Date of Admission: 02/14/2021  Date of Discharge: 02/15/2021 Admitting Physician: Martyn Malay, MD  Primary Care Provider: [name], MD Consultants: Cardiology  Indication for Hospitalization: Afib/Aflutter w/ RVR  Discharge Diagnoses/Problem List:  Afib w/ RVR CAD s/p AICD HFrEF Lupus CKD 3 s/p renal transplant HTN HLD OSA on CPAP Pre-diabetes  Disposition: Home  Discharge Condition: Stable  Discharge Exam:   General: Alert, Laying in bed, NAD Cardiovascular: RRR, no murmur, normal S1/S2 Respiratory: CTAB, no wheezing or crackles Abdomen: Soft, no distension, no tenderness Extremities: No edema bilaterally, +2 pedal and radial pulse     Brief Dyer Course:  Roy Dyer is a 55 y.o. male who presented with shortness of breath and diaphoresis and was found to be in a-flutter with RVR. PMH is significant for newly diagnosed a-flutter, recent pericarditis, severe premature CAD, s/p AICD, chronic combined HFrEF 30-35%, CKD 3 s/p renal transplant in 2012, hypertension, hyperlipidemia, lupus, OSA on CPAP, pre-DM.  Patient noticed atrial fibrillation reading on his apple watch with HR in the 150s and reached out to his cardiologist. Trial of one time 5mg  dose of Bystolic recommended by cardiology NP did not provide any relief after 1 hour and patient was recommend to come to the ED.  In the ED, he was found to be in a-flutter with RVR with HR in the 150s. His early labs in the ED showed BNP 1742, troponin 38,Cr 2.06, BUN 23, GFR 38, Na 138, K 4.5. Initial CXR showed significantly enlarged cardiac silhouette,  pulmonary vascular congestion and small pleural effusion with mild left basilar atelectasis. Cardiology was consulted. He was given one time bolus of amiodarone 150mg  and started on IV  amiodarone drip which brought his HR down to the 90-100s bpm. Plan was to consider DCCV if he remained in AFIB after amiodarone drip. Patient converted to sinus rhythm without need for DCCV.  At the time of discharge patient was in normal sinus rhythm and denied having any chest pains, dyspnea, palpitation.  Per Dr. Curt Bears (EP), patient will be sent home on Amiodarone 400 mg BID for 2 weeks followed by 200 mg daily. Patient will follow up outpatient with cardiology/EP for continued care.    Issues for Follow Up:  Follow up with cardiology  Significant Procedures: None   Significant Labs and Imaging:  Recent Labs  Lab 02/14/21 2032  WBC 10.2  HGB 15.0  HCT 45.9  PLT 313   Recent Labs  Lab 02/14/21 2032 02/15/21 0045 02/15/21 0616  NA 138 137  --   K 4.5 3.9  --   CL 100 100  --   CO2 27 27  --   GLUCOSE 124* 116*  --   BUN 23* 22*  --   CREATININE 2.06* 1.88*  --   CALCIUM 9.2 8.9  --   MG  --   --  1.8    DG Chest Portable 1 View  Result Date: 02/14/2021 CLINICAL DATA:  Chest pain. EXAM: PORTABLE CHEST 1 VIEW COMPARISON:  Chest x-ray 02/05/2021. FINDINGS: Right-sided pacemaker is unchanged in position. The cardiac silhouette is markedly enlarged, unchanged. There is some patchy opacities in the left lung base with likely small left pleural effusion. There is central pulmonary vascular congestion. There is no pneumothorax or acute fracture. IMPRESSION: Stable marked cardiomegaly with central pulmonary vascular congestion.  Minimal left basilar atelectasis/airspace disease with small left pleural effusion. Electronically Signed   By: Ronney Asters M.D.   On: 02/14/2021 22:00     Results/Tests Pending at Time of Discharge: None   Discharge Medications:  Allergies as of 02/15/2021       Reactions   Triptans Palpitations   Reaction to Maxalt        Medication List     STOP taking these medications    acetaminophen 500 MG tablet Commonly known as: TYLENOL    potassium chloride SA 20 MEQ tablet Commonly known as: KLOR-CON   torsemide 20 MG tablet Commonly known as: Demadex       TAKE these medications    allopurinol 100 MG tablet Commonly known as: ZYLOPRIM Take 1 tablet (100 mg total) by mouth daily.   amiodarone 200 MG tablet Commonly known as: PACERONE Take 1 tablet (200 mg total) by mouth 2 (two) times daily.   atorvastatin 80 MG tablet Commonly known as: LIPITOR Take 80 mg by mouth at bedtime.   clopidogrel 75 MG tablet Commonly known as: PLAVIX TAKE 1 TABLET BY MOUTH DAILY   colchicine 0.6 MG tablet Take 1 tablet (0.6 mg total) by mouth daily.   dapagliflozin propanediol 10 MG Tabs tablet Commonly known as: FARXIGA Take 1 tablet (10 mg total) by mouth daily.   Eliquis 5 MG Tabs tablet Generic drug: apixaban Take 1 tablet (5 mg total) by mouth 2 (two) times daily.   famotidine 20 MG tablet Commonly known as: PEPCID Take 40 mg by mouth at bedtime.   fluticasone 50 MCG/ACT nasal spray Commonly known as: FLONASE Place 1 spray into both nostrils daily as needed for allergies.   magnesium oxide 400 MG tablet Commonly known as: MAG-OX Take 400 mg by mouth daily.   melatonin 3 MG Tabs tablet Take 1 tablet (3 mg total) by mouth at bedtime as needed. What changed: reasons to take this   mexiletine 150 MG capsule Commonly known as: MEXITIL Take 2 capsules (300 mg total) by mouth 2 (two) times daily.   mycophenolate 250 MG capsule Commonly known as: CELLCEPT Take 3 capsules (750 mg total) by mouth 2 (two) times daily.   nitroGLYCERIN 0.4 MG SL tablet Commonly known as: NITROSTAT Place 1 tablet (0.4 mg total) under the tongue every 5 (five) minutes as needed for chest pain (DO NOT EXCEED 3 DOSES).   predniSONE 5 MG tablet Commonly known as: DELTASONE TAKE 1 TABLET BY MOUTH DAILY What changed: when to take this   ROLAIDS PO Take 1 tablet by mouth daily as needed (heartburn).   tacrolimus 0.5 MG  capsule Commonly known as: PROGRAF Take 0.5 mg by mouth every 12 (twelve) hours.   traMADol 50 MG tablet Commonly known as: ULTRAM Take 50 mg by mouth every 6 (six) hours as needed. for pain        Discharge Instructions: Please refer to Patient Instructions section of EMR for full details.  Patient was counseled important signs and symptoms that should prompt return to medical care, changes in medications, dietary instructions, activity restrictions, and follow up appointments.   Follow-Up Appointments:  Follow-up Information     [facility] Follow up.   Specialty: Cardiology Why: 03/06/21 at 10:30 AM [facility], [address] Code [phone] Contact information: [address] [id number] [place],  [name], MD. Schedule an appointment as soon as possible for a visit in 1 week(s).   Specialty: Family Medicine Contact information: [address] [place] [address] [phone]         Josue Hector, MD .   Specialty: Cardiology Contact information: [phone] [address] [place] [address] [phone]                 Alen Bleacher, MD 02/15/2021, 3:44 PM PGY-1, [place] Upper-Level Resident Addendum I have discussed the above with Dr. Adah Salvage and agree with the documented plan. My edits for correction/addition/clarification are included above. Please also see any attending notes.   Alcus Dad, MD PGY-2, [facility] 02/16/2021 8:22 AM

## 2021-02-15 NOTE — ED Notes (Signed)
Admit provider at bedside 

## 2021-02-15 NOTE — ED Notes (Signed)
Noted order for cpap. Spoke with pt and pt does not wish for one tonight. States if he is here tonight he will bring his from home. Denies any other needs at this time

## 2021-02-15 NOTE — Consult Note (Signed)
Cardiology Consultation:   Patient ID: Roy Dyer MRN: [id number]; DOB: [birth year]  Admit date: 02/14/2021 Date of Consult: 02/15/2021  PCP:  [name], MD   [facility] Providers Cardiologist:  [name], MD        Patient Profile:   Roy Dyer is a 55 y.o. 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 Owens Shark  History of Present Illness:   [name] s a 55 y.o. 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 Owens Shark. Of note he was recently discharged from the hospital  09/20 3-01/2027 for chest pain radiating to the back and neck in which he was treated for concern for pericarditis and new onset atrial flutter seen on EKG.  He was given colchicine and Amiodarone at discharge. However this afternoon went back in to [place] with RVR with HR in the 150s and his Apple watch showing he was in atrial fibrillation. He was told to come to the ED here. Had no SOB or CP. SBP were stable. Cardiology was consulted for Aflutter management.   Past Medical History:  Diagnosis Date   AICD (automatic cardioverter/defibrillator) present    Dr. Paschal Dopp follows remotely-yrly checks, Dr. Jonne Ply   Atrial flutter ([place]) 01/2021   dx'd when in [place] for pericarditis->amiodarone+eliquis   Avascular necrosis of bone of hip ([place]) 2010 surg   Left hip arthroplasty: from chronic systemic steroids taken for his Lupus   Barrett's esophagus 2017; 2021   Blood transfusion without reported diagnosis 2010, 2012   CAD (coronary artery disease)    **DAPT long term recommended by cards**.  a. stents RCA/Circ 2001 b. BMS  to LAD 07/2010  c. 01/2017: s/p DES to RCA.    Cataract    left eye   CHF (congestive heart failure) (HCC)    Chronic headaches 02/2017   As of 01/2017, HA's no better, neurologist ordered CT.  ESR normal at that time.  HA'S COMPLETELY RESOLVED AFTER HE GOT ON CPAP 2019/2020.   Chronic renal insufficiency, stage 3 (moderate) (HCC)    GFR 55-60 (sCr 1.4-1.7 range)   Erythrocytosis    Pre and post transplant->on losartan for this   GERD (gastroesophageal reflux disease)    Hx of esoph stricture and dilatation.  +Barrett's esophagus+hx of aspiration pneumonia-->to get rpt EGD when he comes off DAPT (utd as of 01/29/18)   Gout    s/p renal transplant he was weened off of his allopurinol.   Hiatal hernia    History of adenomatous polyp of colon 09/2019   polyp x 1. Recall 5 yrs   History of end stage renal disease 04/24/2011   Secondary to SLE: HD 06/2011-10/2012 (then got renal transplant)   History of renal transplant 11/10/2012   10/2012 [facility]    Hyperlipidemia    Hypertension    implantable cardiac defibrillator single chamber    Medtronic (due to low EF)   Ischemic cardiomyopathy    Chronic systolic dysfunction--  00/3491 EF 30-35%.   Single chamber ICD 11/20/10 ([name])   Left ventricular thrombus 2012   Re-eval 02/2011 showed thrombus RESOLVED, so coumadin was d/c'd (  was on it for 79mo   Lupus ([place]    OSA on CPAP 2018   Dr. [name]9/2018: +OSA on home sleep studay; CPAP auto titrate 5-20 started 05/2017.   Osteoporosis    Pericarditis associated with systemic lupus erythematosus [place] 01/2021   hosp->colchicine   Prediabetes 12/2019   A1c stable long term at 5.6-5.7 until jump up to 6.2% Aug 2021.    Past Surgical History:  Procedure Laterality Date   AV FISTULA PLACEMENT  03/28/2011   Procedure: ARTERIOVENOUS (AV) FISTULA CREATION;  Surgeon: TRosetta Posner MD;  [facility]  Service: Vascular;  Laterality: Left;  Creation of Left radiocephallic cimino fistula   AV FISTULA  PLACEMENT  04/27/2011   Procedure: ARTERIOVENOUS (AV) FISTULA CREATION;  Surgeon: TRosetta Posner MD;  [facility] [facility]  Service: Vascular;  Laterality: Left;  left basilic vein transposition   BIOPSY  09/14/2019   Procedure: BIOPSY;  Surgeon: PJerene Bears MD;  [facility];  Service: Gastroenterology;;   CARDIAC CATHETERIZATION  08/2010; 04/22/20   08/2010; 04/2020->mild/mod in-stent restenosis but none hemodynamically signif, severe LV dysfxn, frequenct PVCs-->referred to HF clinic for optimization of med mgmt   CARDIOVASCULAR STRESS TEST  07/2010; 03/2014   03/2014 showed large old infarct and EF 30-35% but no ischemia   COLONOSCOPY  09/14/2019   Adenoma x 1.  Recall 5 yrs   COLONOSCOPY WITH PROPOFOL N/A 09/14/2019   Procedure: COLONOSCOPY WITH PROPOFOL;  Surgeon: PJerene Bears MD;  [facility];  Service: Gastroenterology;  Laterality: N/A;   CORONARY PRESSURE WIRE/FFR WITH 3D MAPPING N/A 04/22/2020   Procedure: Coronary Pressure Wire/FFR w/3D Mapping;  Surgeon: AWellington Hampshire MD;  [facility];  Service: Cardiovascular;  Laterality: N/A;   DEXA  07/05/2017   Normal bone density in radius and spine.   ESOPHAGOGASTRODUODENOSCOPY  02/2009   Done by Dr. SFuller Planfor hematemesis; esophagitis found.  Repeat recommended 09/2016, at which time he will also likely get his initial screening colonoscopy.   ESOPHAGOGASTRODUODENOSCOPY  09/14/2019   esophagitis (improved compared to 2017 EGD), Barrett's.  Stomach and duodenum normal.   ESOPHAGOGASTRODUODENOSCOPY (EGD) WITH PROPOFOL N/A 09/29/2015   REPEAT EGD RECOMMENDED 09/2016.  Barrett's esoph + mild chronic gastritis.  H pylori NEG. Procedure: ESOPHAGOGASTRODUODENOSCOPY (EGD) WITH PROPOFOL;  Surgeon: JJerene Bears MD;  [facility];  Service: Gastroenterology;  Laterality: N/A;   ESOPHAGOGASTRODUODENOSCOPY (EGD) WITH PROPOFOL N/A 09/14/2019   Procedure: ESOPHAGOGASTRODUODENOSCOPY (EGD) WITH PROPOFOL;   Surgeon: PJerene Bears MD;  [facility];  Service: Gastroenterology;  Laterality: N/A;   ICD GENERATOR CHANGEOUT N/A 04/11/2018   Procedure: ICD GENERATOR CHANGEOUT;  Surgeon: KDeboraha Sprang MD;  [facility];  Service: Cardiovascular;  Laterality: N/A;   INSERT / REPLACE / REMOVE PACEMAKER     medtronic        dr nFrances Nickels   [place]   icd only   KIDNEY TRANSPLANT  10/2012   [facility] nephrologist--Dr. MBlair Heys  LEFT HEART CATH AND CORONARY ANGIOGRAPHY N/A 02/08/2017   PCA and DES to mRCA--needs DAPT for at least 6 mo.   Procedure: LEFT HEART CATH AND CORONARY ANGIOGRAPHY;  Surgeon: CSherren Mocha MD;  [facility];  Service: Cardiovascular;  Laterality: N/A;   LEFT HEART CATH AND CORONARY ANGIOGRAPHY N/A 04/22/2020   Procedure: LEFT HEART CATH AND CORONARY ANGIOGRAPHY;  Surgeon: AWellington Hampshire MD;  [facility];  Service: Cardiovascular;  Laterality: N/A;   PARTIAL HIP ARTHROPLASTY Left 2010   left   POLYPECTOMY  09/14/2019   Procedure: POLYPECTOMY;  Surgeon: Jerene Bears, MD;  [facility];  Service: Gastroenterology;;   RENAL BIOPSY     stents     05-2010 and 2- in 2010 and 1 in 2018.   TOTAL HIP ARTHROPLASTY  07/09/2011   Procedure: TOTAL HIP ARTHROPLASTY;  Surgeon: Kerin Salen, MD;  Location: [facility];  Service: Orthopedics;  Laterality: Right;   TRANSTHORACIC ECHOCARDIOGRAM  10/13/2020   EF 30-35% (slight improved), grd III DD, elev PA pressure, mold/mod tricusp regurg   TYMPANOSTOMY TUBE PLACEMENT  55 yrs old   US ECHOCARDIOGRAPHY  12/2010; 02/2014;08/2014;08/2019   02/2014 EF still 25-30%, increased PA pressures.  2016 EF 30-35%, sept/inf hypokinesis, mod tricusp regurg. 08/2019 EF 25-30%, global hypok, sev dil LV w/apical aneur, grd III DD, mod tricus reg, mod pulm htn   ZIO PATCH  04/2020   HIGH PVC BURDEN.  NSVT, SVT, BBB.       Inpatient Medications: Scheduled Meds:  allopurinol  100 mg Oral Daily    apixaban  5 mg Oral BID   atorvastatin  80 mg Oral QHS   clopidogrel  75 mg Oral Daily   colchicine  0.6 mg Oral Daily   famotidine  40 mg Oral QHS   magnesium oxide  400 mg Oral Daily   mexiletine  300 mg Oral BID   mycophenolate  750 mg Oral BID   predniSONE  5 mg Oral Daily   tacrolimus  0.5 mg Oral Q12H   Continuous Infusions:  amiodarone 60 mg/hr (02/15/21 0027)   Followed by   amiodarone     PRN Meds: acetaminophen **OR** acetaminophen, melatonin  Allergies:    Allergies  Allergen Reactions   Triptans Palpitations    Reaction to [place]    Social History:   Social History   Socioeconomic History   Marital status: Married    Spouse name: Not on file   Number of children: 2   Years of education: Not on file   Highest education level: Not on file  Occupational History   Occupation: CAD Drafter     Employer: [employer]  Tobacco Use   Smoking status: Former    Packs/day: 0.50    Years: 30.00    Pack years: 15.00    Types: Cigarettes    Quit date: 08/02/2010    Years since quitting: 10.5   Smokeless tobacco: Never  Vaping Use   Vaping Use: Never used  Substance and Sexual Activity   Alcohol use: No    Alcohol/week: 0.0 standard drinks   Drug use: No   Sexual activity: Yes    Partners: Female  Other Topics Concern   Not on file  Social History Narrative   Married, one adult daughter and one adult son.    Occupation: Printmaker.1   5 pack-yr smoking hx, quit 07/2010.   Drug Use - noNo alcohol.   Social Determinants of Health   Financial Resource Strain: Low Risk    Difficulty of Paying Living Expenses: Not very hard  Food Insecurity: No Food Insecurity   Worried About Charity fundraiser in the Last Year: Never true   Ran Out of Food in the Last Year: Never true  Transportation Needs: No Transportation Needs   Lack of Transportation (Medical): No   Lack of Transportation (Non-Medical): No  Physical Activity: Not on file  Stress:  Not on file   Social Connections: Not on file  Intimate Partner Violence: Not on file    Family History:   Family History  Problem Relation Age of Onset   Kidney failure Mother    Lupus Mother    Stroke Mother    Diabetes Mother        type 2   Heart attack Father        X 7   Hypertension Father    Hyperlipidemia Father    Heart attack Sister 18       X 1   Hyperlipidemia Sister    Hypertension Sister    Heart disease Sister    Heart attack Paternal Grandfather    Heart disease Paternal Aunt    Heart disease Paternal Uncle    Colon cancer Neg Hx    Esophageal cancer Neg Hx    Stomach cancer Neg Hx    Rectal cancer Neg Hx      ROS:  Please see the history of present illness.  All other ROS reviewed and negative.     Physical Exam/Data:   Vitals:   02/14/21 2315 02/15/21 0015 02/15/21 0029 02/15/21 0115  BP: 111/77 104/84  116/85  Pulse: (!) 108 90  67  Resp: (!) 26 (!) 32  (!) 31  Temp:      TempSrc:      SpO2: 95% 92%  96%  Weight:   93.9 kg   Height:   5' 7"  (1.702 m)    No intake or output data in the 24 hours ending 02/15/21 0239 Last 3 Weights 02/15/2021 02/08/2021 02/06/2021  Weight (lbs) 207 lb 209 lb 4.8 oz 212 lb 14.4 oz  Weight (kg) 93.895 kg 94.938 kg 96.571 kg     Body mass index is 32.42 kg/m.  General:  Well nourished, well developed, in no acute distress HEENT: normal Neck: no JVD Vascular: No carotid bruits; Distal pulses 2+ bilaterally Cardiac: Tachycardic; irregular Lungs:  clear to auscultation bilaterally, no wheezing, rhonchi or rales  Abd: soft, nontender, no hepatomegaly  Ext: no edema Musculoskeletal:  No deformities, BUE and BLE strength normal and equal Skin: warm and dry  Neuro:  CNs 2-12 intact, no focal abnormalities noted Psych:  Normal affect   EKG:  The EKG was personally reviewed and demonstrates: Aflutter   Laboratory Data:  High Sensitivity Troponin:   Recent Labs  Lab 02/03/21 1328 02/03/21 1426 02/14/21 2047  02/14/21 2247  TROPONINIHS 21* 20* 38* 45*     Chemistry Recent Labs  Lab 02/14/21 2032 02/15/21 0045  NA 138 137  K 4.5 3.9  CL 100 100  CO2 27 27  GLUCOSE 124* 116*  BUN 23* 22*  CREATININE 2.06* 1.88*  CALCIUM 9.2 8.9  GFRNONAA 38* 42*  ANIONGAP 11 10    No results for input(s): PROT, ALBUMIN, AST, ALT, ALKPHOS, BILITOT in the last 168 hours. Lipids No results for input(s): CHOL, TRIG, HDL, LABVLDL, LDLCALC, CHOLHDL in the last 168 hours.  Hematology Recent Labs  Lab 02/14/21 2032  WBC 10.2  RBC 5.14  HGB 15.0  HCT 45.9  MCV 89.3  MCH 29.2  MCHC 32.7  RDW 13.2  PLT 313   Thyroid No results for input(s): TSH, FREET4 in the last 168 hours.  BNP Recent Labs  Lab 02/14/21 2032  BNP 1,742.3*    DDimer No results for input(s): DDIMER in the last 168 hours.   Radiology/Studies:  DG Chest Portable 1 View  Result Date: 02/14/2021 CLINICAL DATA:  Chest pain. EXAM: PORTABLE CHEST 1 VIEW COMPARISON:  Chest x-ray 02/05/2021. FINDINGS: Right-sided pacemaker is unchanged in position. The cardiac silhouette is markedly enlarged, unchanged. There is some patchy opacities in the left lung base with likely small left pleural effusion. There is central pulmonary vascular congestion. There is no pneumothorax or acute fracture. IMPRESSION: Stable marked cardiomegaly with central pulmonary vascular congestion. Minimal left basilar atelectasis/airspace disease with small left pleural effusion. Electronically Signed   By: Ronney Asters M.D.   On: 02/14/2021 22:00     Assessment and Plan:   # Aflutter/Afib  # AICD; PVC  # Pericarditis  # Chronic HFrEF  # CAD  -Start Amiodarone drip after a bolus of 150 -Hold BB for now -Continue Elliquis -If remains in Afib till morning, can consider potentially DCCV. Please keep NPO at midnight. Will talk to our EP colleagues.  -K>4 and Mg >2 -C/w home medications for CAD   For questions or updates, please contact [place] Please  consult [email or web address] for contact info under    Signed, Jaci Lazier, MD  02/15/2021 2:39 AM

## 2021-02-15 NOTE — Progress Notes (Signed)
Family Medicine Teaching Service Daily Progress Note Intern Pager: [phone]  Patient name: Roy Dyer Medical record number: [id number] Date of birth: 29-Dec-1965 Age: 55 y.o. Gender: male  Primary Care Provider: [name], MD Consultants: Cardiology  Code Status: Full Code  Pt Overview and Major Events to Date:  10/04 - admitted   Roy Dyer is a 55 year old male who was admitted for acute A. fib/A-flutter.  PMH is significant for premature CAD, HFrEF 30 to 35%, AICD, HTN, HLP, lupus, CKD 3 s/p renal transplant 2012, prediabetes, OSA on CPAP  Assessment and Plan:  Atrial Fib/A.flutter with RVR Patient was admitted for Afib/Aflutter that was was picked up on his apple watch. Reading on the cardiac monitor in his room this morning showed patient is in SR. Patient is very in tune with his medicare and reported he noticed his rhythm converted to SR about 2 hours ago (sometime around 6 am). He denies any pain, palpitation or difficulty breathing.  He said the chest pressure he felt yesterday was most likely from the anxiety of finding out his rhythm was in A. fib.  He reports feeling much better and more relaxed. Cardiology following and recommend holding his beta-blocker.They will consider DCCV if Afib continues. Per cardiology goal is Magnesium >2 Potassium > 4. Mg this morning was 1.8 and K was 3.9. will replenish Mg and K  Cardiology following, appreciate recs -Continue Elliquis -Hold home medication of BB  -Monitor electrolyte -Order magnesium -Continue CRM -Continue routine vitals  Acute on Chronic CHF Most recent troponin is 45 this morning from 38 on admission. Will continue to trend his troponin. CXR showed stable enlarged cardiac Silhouette, pulmonary vascular congestion and small pleural effusion. On exam he has clear breath sounds, no crackles or wheezing or elevated JVP.    CAD with prior MI/Ischemic cardiomyopathy Most recent troponin is 45 this morning from 38 on  admission. Patient does not take aspirin and not sure why. He said it could be related to the fact that he is both on Plavix and Eliquis -Continue Plavix 70 mg daily -Continue Eliquis 5 mg twice daily -Continue cardiac monitoring  CKD STAGE IIIa s/p Kidney Transplant in 2012 Most recent creatinine is 1.88 which seems to be his baseline.  He has not voided today but said he is has no issue with voiding.  Hyperlipidemia Stable from last lipid panel in 02/22.  Home medication includes Lipitor 80 mg and niacin 500 mg daily.  We will hold niacin due to his dysrhythmia -Continue Lipitor 80 mg daily  HTN He has been normotensive since admission.   Prediabetes CBG this morning was 116.  OSA on home CPAP Declined using the hospital CPAP overnight. Will prefer using his home CPAP    FEN/GI: Heart healthy diet PPx: Eliquis Dispo:Home pending clinical improvement .   Subjective:  Roy Dyer said he is feeling much better today. He denies having any chest pain and attributes the chest pressure he felt yesterday to feeling anxious due to the AFib picked up by his apple watch. He denies having any pain, headache, dizziness or SOB.   Objective: Temp:  [98.3 F (36.8 C)] 98.3 F (36.8 C) (10/04 2026) Pulse Rate:  [30-153] 57 (10/05 0545) Resp:  [15-32] 15 (10/05 0545) BP: (104-127)/(76-91) 121/76 (10/05 0545) SpO2:  [92 %-97 %] 96 % (10/05 0545) Weight:  [93.9 kg] 93.9 kg (10/05 0029) Physical Exam: General: Alert, Laying in bed, NAD Cardiovascular: RRR, no murmur, normal S1/S2 Respiratory: CTAB,  no wheezing or crackles Abdomen: Soft, no distension, no tenderness Extremities: No edema bilaterally, +2 pedal and radial pulse   Laboratory: Recent Labs  Lab 02/14/21 2032  WBC 10.2  HGB 15.0  HCT 45.9  PLT 313   Recent Labs  Lab 02/14/21 2032 02/15/21 0045  NA 138 137  K 4.5 3.9  CL 100 100  CO2 27 27  BUN 23* 22*  CREATININE 2.06* 1.88*  CALCIUM 9.2 8.9  GLUCOSE 124* 116*       Imaging/Diagnostic Tests: DG Chest Portable 1 View  Result Date: 02/14/2021 CLINICAL DATA:  Chest pain. EXAM: PORTABLE CHEST 1 VIEW COMPARISON:  Chest x-ray 02/05/2021. FINDINGS: Right-sided pacemaker is unchanged in position. The cardiac silhouette is markedly enlarged, unchanged. There is some patchy opacities in the left lung base with likely small left pleural effusion. There is central pulmonary vascular congestion. There is no pneumothorax or acute fracture. IMPRESSION: Stable marked cardiomegaly with central pulmonary vascular congestion. Minimal left basilar atelectasis/airspace disease with small left pleural effusion. Electronically Signed   By: Ronney Asters M.D.   On: 02/14/2021 22:00     Roy Bleacher, MD 02/15/2021, 6:48 AM PGY-1, [place] Intern pager: [phone], text pages welcome

## 2021-02-15 NOTE — Discharge Instructions (Addendum)
You were admitted to the hospital with Atrial Fibrillation  You will need to follow up in 2-3 weeks with Cardiology You will need to follow up in 4 weeks with EP for ablation therapy   What to do after you leave the hospital: Recommended diet: cardiac diet Recommended activity: activity as tolerated  Please seek medical attention if you experience any chest pain, shortness of breath, fevers,  please call your Cardiologist or go to the nearest Emergency department.

## 2021-02-15 NOTE — Hospital Course (Addendum)
Roy Dyer is a 55 y.o. male presenting with tachycardia with heart rate in the 150s, shortness of breath, diaphoresis in setting of newly diagnosed atrial flutter. 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   Patience noticed atrial fibrillation reading on his apple watch yesterday evening with HR in the 150s and reached out to his cardiologist. Trial  of one time 5mg  dose of Bystolic recommended by cardiology NP did not provide any relieve after 1 hour and patient was recommend to come to the ED. In the ED, he was found to be in AFIB RVR with HR in the 150s. His early labs in the ED was BNP 1742, troponin 38,Cr 2.06, BUN 23, GFR 38, Na 138, K 4.5. Initial CXR showed significantly enlarge cardiac silhouette,  pulmonary vascular congestion and small pleura effusion with mild left basilar atelectasis. Cardiology was consulted. He was given one time bolus of amiodarone 150mg  and started on IV amiodarone drip which brought his HR down to the 90-100s bpm. Plan was to consider DCCV if he remained in AFIB after amiodarone drip. Patient eventual converted to SR without need for  DCCV. His home medication of Cardiology electrolyte goal was Mg > 2 and K >4. Patient's electrolyte were replenished to meet electrolyte goals. At the time of discharge patient was in SR with reassuring HR and denies having any chest pains, dyspnea, palpitation.  Per Dr. Curt Bears patient will be sent home on Amiodarone 400 mg BID for 2 weeks followed by 200 mg daily. Patient will follow up outpatient with cardiology for continued care.

## 2021-02-16 ENCOUNTER — Encounter (HOSPITAL_COMMUNITY): Payer: Self-pay

## 2021-02-16 ENCOUNTER — Encounter (HOSPITAL_COMMUNITY): Payer: Managed Care, Other (non HMO)

## 2021-02-23 ENCOUNTER — Encounter: Payer: Self-pay | Admitting: Family Medicine

## 2021-02-23 ENCOUNTER — Ambulatory Visit: Payer: Managed Care, Other (non HMO) | Admitting: Family Medicine

## 2021-02-23 ENCOUNTER — Other Ambulatory Visit (HOSPITAL_COMMUNITY): Payer: Self-pay

## 2021-02-23 ENCOUNTER — Other Ambulatory Visit: Payer: Self-pay

## 2021-02-23 ENCOUNTER — Telehealth (HOSPITAL_COMMUNITY): Payer: Self-pay

## 2021-02-23 VITALS — BP 120/70 | HR 71 | Temp 97.6°F | Ht 67.0 in | Wt 203.2 lb

## 2021-02-23 DIAGNOSIS — Z9225 Personal history of immunosupression therapy: Secondary | ICD-10-CM

## 2021-02-23 DIAGNOSIS — N183 Chronic kidney disease, stage 3 unspecified: Secondary | ICD-10-CM | POA: Diagnosis not present

## 2021-02-23 DIAGNOSIS — Z940 Kidney transplant status: Secondary | ICD-10-CM | POA: Diagnosis not present

## 2021-02-23 DIAGNOSIS — Z8679 Personal history of other diseases of the circulatory system: Secondary | ICD-10-CM

## 2021-02-23 DIAGNOSIS — I255 Ischemic cardiomyopathy: Secondary | ICD-10-CM

## 2021-02-23 DIAGNOSIS — N2889 Other specified disorders of kidney and ureter: Secondary | ICD-10-CM

## 2021-02-23 DIAGNOSIS — I4891 Unspecified atrial fibrillation: Secondary | ICD-10-CM | POA: Diagnosis not present

## 2021-02-23 LAB — BASIC METABOLIC PANEL
BUN: 15 mg/dL (ref 6–23)
CO2: 30 mEq/L (ref 19–32)
Calcium: 9.3 mg/dL (ref 8.4–10.5)
Chloride: 98 mEq/L (ref 96–112)
Creatinine, Ser: 1.72 mg/dL — ABNORMAL HIGH (ref 0.40–1.50)
GFR: 44.69 mL/min — ABNORMAL LOW (ref >60.00)
Glucose, Bld: 104 mg/dL — ABNORMAL HIGH (ref 70–99)
Potassium: 4.4 mEq/L (ref 3.5–5.1)
Sodium: 139 mEq/L (ref 135–145)

## 2021-02-23 MED ORDER — APIXABAN 5 MG PO TABS: 5.0000 mg | ORAL_TABLET | Freq: Two times a day (BID) | ORAL | 11 refills | Status: DC

## 2021-02-23 NOTE — Progress Notes (Signed)
OFFICE VISIT  02/23/2021  CC:  Chief Complaint  Patient presents with   Hospitalization Follow-up   HPI:    Patient is a 55 y.o. male who presents for hosp f/u. Eryn was admitted 9/23-9/28, 2022 for acute pericarditis d/t lupus-->colchicine.  Developed a-fib in [place] and went home on amiodarone and eliquis.  He was kept on plavix but ASA d/c'd.  He was then admitted 10/4-10/5, 2022 for afib/flutter with RVR->IV amio converted w/out need for DCCV.   Has f/u with cardiol 10/24. Home on amio 200 bid x 2wks then 400 qd and f/u with Dr. Curt Bears 03/28/21.  INTERIM HX: Doing okay, says he feels about 75%.  Is taking amiodarone 200 mg twice a day and he has not experienced any palpitations, racing heart, chest pain, dizziness, or shortness of breath. He is trying to find a balance with his torsemide.  He does not notice fluid retention but develops significant dry cough and takes torsemide and feels like it resolves.  He then goes a day or 2 without needing it. Taking colchicine 0.6 mg daily. Appetite is poor but he denies any abdominal pain, nausea, or dysphagia.  Continues to take Dexilant 60 mg a day.  ROS as above, plus--> no fevers, no wheezing, no HAs, no rashes, no melena/hematochezia.  No polyuria or polydipsia.  No myalgias or arthralgias.  No focal weakness, paresthesias, or tremors.  No acute vision or hearing abnormalities.  No dysuria or unusual/new urinary urgency or frequency.  No recent changes in lower legs. No constipation.  Has occ loose stool that he feels is med related.  Past Medical History:  Diagnosis Date   AICD (automatic cardioverter/defibrillator) present    Dr. Paschal Dopp follows remotely-yrly checks, Dr. Jonne Ply   Atrial flutter ([place]) 01/2021   dx'd when in [place] for pericarditis->amiodarone+eliquis   Avascular necrosis of bone of hip ([place]) 2010 surg   Left hip arthroplasty: from chronic systemic steroids taken for his Lupus   Barrett's esophagus 2017;  2021   Blood transfusion without reported diagnosis 2010, 2012   CAD (coronary artery disease)    **DAPT long term recommended by cards**.  a. stents RCA/Circ 2001 b. BMS to LAD 07/2010  c. 01/2017: s/p DES to RCA.    Cataract    left eye   CHF (congestive heart failure) (HCC)    Chronic headaches 02/2017   As of 01/2017, HA's no better, neurologist ordered CT.  ESR normal at that time.  HA'S COMPLETELY RESOLVED AFTER HE GOT ON CPAP 2019/2020.   Chronic renal insufficiency, stage 3 (moderate) (HCC)    GFR 55-60 (sCr 1.4-1.7 range)   Erythrocytosis    Pre and post transplant->on losartan for this   GERD (gastroesophageal reflux disease)    Hx of esoph stricture and dilatation.  +Barrett's esophagus+hx of aspiration pneumonia-->to get rpt EGD when he comes off DAPT (utd as of 01/29/18)   Gout    s/p renal transplant he was weened off of his allopurinol.   Hiatal hernia    History of adenomatous polyp of colon 09/2019   polyp x 1. Recall 5 yrs   History of end stage renal disease 04/24/2011   Secondary to SLE: HD 06/2011-10/2012 (then got renal transplant)   History of renal transplant 11/10/2012   10/2012 [facility]    Hyperlipidemia    Hypertension    implantable cardiac defibrillator single chamber    Medtronic (due to low EF)   Ischemic cardiomyopathy    Chronic systolic dysfunction--  02/2011 EF 30-35%.   Single chamber ICD 11/20/10 ([name])   Left ventricular thrombus 2012   Re-eval 02/2011 showed thrombus RESOLVED, so coumadin was d/c'd (was on it for 14mo   [place]    OSA on CPAP 2018   Dr. [name]9/2018: +OSA on home sleep studay; CPAP auto titrate 5-20 started 05/2017.   Osteoporosis    Pericarditis associated with systemic lupus erythematosus [place] 01/2021   hosp->colchicine   Prediabetes 12/2019   A1c stable long term at 5.6-5.7 until jump up to 6.2% Aug 2021.    Past Surgical History:  Procedure Laterality Date   AV FISTULA PLACEMENT  03/28/2011   Procedure: ARTERIOVENOUS  (AV) FISTULA CREATION;  Surgeon: TRosetta Posner MD;  [facility] [facility]  Service: Vascular;  Laterality: Left;  Creation of Left radiocephallic cimino fistula   AV FISTULA PLACEMENT  04/27/2011   Procedure: ARTERIOVENOUS (AV) FISTULA CREATION;  Surgeon: TRosetta Posner MD;  [place]  Service: Vascular;  Laterality: Left;  left basilic vein transposition   BIOPSY  09/14/2019   Procedure: BIOPSY;  Surgeon: PJerene Bears MD;  [facility];  Service: Gastroenterology;;   CARDIAC CATHETERIZATION  08/2010; 04/22/20   08/2010; 04/2020->mild/mod in-stent restenosis but none hemodynamically signif, severe LV dysfxn, frequenct PVCs-->referred to HF clinic for optimization of med mgmt   CARDIOVASCULAR STRESS TEST  07/2010; 03/2014   03/2014 showed large old infarct and EF 30-35% but no ischemia   COLONOSCOPY  09/14/2019   Adenoma x 1.  Recall 5 yrs   COLONOSCOPY WITH PROPOFOL N/A 09/14/2019   Procedure: COLONOSCOPY WITH PROPOFOL;  Surgeon: PJerene Bears MD;  [facility];  Service: Gastroenterology;  Laterality: N/A;   CORONARY PRESSURE WIRE/FFR WITH 3D MAPPING N/A 04/22/2020   Procedure: Coronary Pressure Wire/FFR w/3D Mapping;  Surgeon: AWellington Hampshire MD;  [facility];  Service: Cardiovascular;  Laterality: N/A;   DEXA  07/05/2017   Normal bone density in radius and spine.   ESOPHAGOGASTRODUODENOSCOPY  02/2009   Done by Dr. SFuller Planfor hematemesis; esophagitis found.  Repeat recommended 09/2016, at which time he will also likely get his initial screening colonoscopy.   ESOPHAGOGASTRODUODENOSCOPY  09/14/2019   esophagitis (improved compared to 2017 EGD), Barrett's.  Stomach and duodenum normal.   ESOPHAGOGASTRODUODENOSCOPY (EGD) WITH PROPOFOL N/A 09/29/2015   REPEAT EGD RECOMMENDED 09/2016.  Barrett's esoph + mild chronic gastritis.  H pylori NEG. Procedure: ESOPHAGOGASTRODUODENOSCOPY (EGD) WITH PROPOFOL;  Surgeon: JJerene Bears MD;  [facility];   Service: Gastroenterology;  Laterality: N/A;   ESOPHAGOGASTRODUODENOSCOPY (EGD) WITH PROPOFOL N/A 09/14/2019   Procedure: ESOPHAGOGASTRODUODENOSCOPY (EGD) WITH PROPOFOL;  Surgeon: PJerene Bears MD;  [facility];  Service: Gastroenterology;  Laterality: N/A;   ICD GENERATOR CHANGEOUT N/A 04/11/2018   Procedure: ICD GENERATOR CHANGEOUT;  Surgeon: KDeboraha Sprang MD;  [place];  Service: Cardiovascular;  Laterality: N/A;   INSERT / REPLACE / REMOVE PACEMAKER     medtronic        dr nFrances Nickels      icd only   KIDNEY TRANSPLANT  10/2012   [facility] nephrologist--Dr. MBlair Heys  LEFT HEART CATH AND CORONARY ANGIOGRAPHY N/A 02/08/2017   PCA and DES to mRCA--needs DAPT for at least 6 mo.   Procedure: LEFT HEART CATH AND CORONARY ANGIOGRAPHY;  Surgeon: CSherren Mocha MD;  [facility];  Service: Cardiovascular;  Laterality: N/A;   LEFT HEART  CATH AND CORONARY ANGIOGRAPHY N/A 04/22/2020   Procedure: LEFT HEART CATH AND CORONARY ANGIOGRAPHY;  Surgeon: Wellington Hampshire, MD;  Location: [place] [facility];  Service: Cardiovascular;  Laterality: N/A;   PARTIAL HIP ARTHROPLASTY Left 2010   left   POLYPECTOMY  09/14/2019   Procedure: POLYPECTOMY;  Surgeon: Jerene Bears, MD;  [facility];  Service: Gastroenterology;;   RENAL BIOPSY     stents     05-2010 and 2- in 2010 and 1 in 2018.   TOTAL HIP ARTHROPLASTY  07/09/2011   Procedure: TOTAL HIP ARTHROPLASTY;  Surgeon: Kerin Salen, MD;  Location: [place];  Service: Orthopedics;  Laterality: Right;   TRANSTHORACIC ECHOCARDIOGRAM  10/13/2020   EF 30-35% (slight improved), grd III DD, elev PA pressure, mold/mod tricusp regurg   TYMPANOSTOMY TUBE PLACEMENT  55 yrs old   US ECHOCARDIOGRAPHY  12/2010; 02/2014;08/2014;08/2019   02/2014 EF still 25-30%, increased PA pressures.  2016 EF 30-35%, sept/inf hypokinesis, mod tricusp regurg. 08/2019 EF 25-30%, global hypok, sev dil LV w/apical aneur, grd III DD,  mod tricus reg, mod pulm htn   ZIO PATCH  04/2020   HIGH PVC BURDEN.  NSVT, SVT, BBB.    Outpatient Medications Prior to Visit  Medication Sig Dispense Refill   allopurinol (ZYLOPRIM) 100 MG tablet Take 1 tablet (100 mg total) by mouth daily. 90 tablet 3   amiodarone (PACERONE) 200 MG tablet Take 1 tablet (200 mg total) by mouth 2 (two) times daily. 60 tablet 0   atorvastatin (LIPITOR) 80 MG tablet Take 80 mg by mouth at bedtime.     Ca Carbonate-Mag Hydroxide (ROLAIDS PO) Take 1 tablet by mouth daily as needed (heartburn).     clopidogrel (PLAVIX) 75 MG tablet TAKE 1 TABLET BY MOUTH DAILY (Patient taking differently: Take 75 mg by mouth daily.) 90 tablet 3   colchicine 0.6 MG tablet Take 1 tablet (0.6 mg total) by mouth daily. 30 tablet 0   dapagliflozin propanediol (FARXIGA) 10 MG TABS tablet Take 1 tablet (10 mg total) by mouth daily. 30 tablet 0   dexlansoprazole (DEXILANT) 60 MG capsule Take 60 mg by mouth daily.     famotidine (PEPCID) 20 MG tablet Take 40 mg by mouth at bedtime.      fluticasone (FLONASE) 50 MCG/ACT nasal spray Place 1 spray into both nostrils daily as needed for allergies.     magnesium oxide (MAG-OX) 400 MG tablet Take 400 mg by mouth daily.      melatonin 3 MG TABS tablet Take 1 tablet (3 mg total) by mouth at bedtime as needed. (Patient taking differently: Take 3 mg by mouth at bedtime as needed (sleep).)  0   mexiletine (MEXITIL) 150 MG capsule Take 2 capsules (300 mg total) by mouth 2 (two) times daily. 360 capsule 3   mycophenolate (CELLCEPT) 250 MG capsule Take 3 capsules (750 mg total) by mouth 2 (two) times daily. 540 capsule 1   nebivolol (BYSTOLIC) 5 MG tablet Take 5 mg by mouth as needed (increased heart rate).     nitroGLYCERIN (NITROSTAT) 0.4 MG SL tablet Place 1 tablet (0.4 mg total) under the tongue every 5 (five) minutes as needed for chest pain (DO NOT EXCEED 3 DOSES). 25 tablet 3   predniSONE (DELTASONE) 5 MG tablet TAKE 1 TABLET BY MOUTH DAILY  (Patient taking differently: Take 5 mg by mouth daily with breakfast.) 90 tablet 3   tacrolimus (PROGRAF) 0.5 MG capsule Take 0.5 mg by mouth every  12 (twelve) hours.      traMADol (ULTRAM) 50 MG tablet Take 50 mg by mouth every 6 (six) hours as needed. for pain  0   apixaban (ELIQUIS) 5 MG TABS tablet Take 1 tablet (5 mg total) by mouth 2 (two) times daily. 60 tablet 0   No facility-administered medications prior to visit.    Allergies  Allergen Reactions   Triptans Palpitations    Reaction to Maxalt    ROS As per HPI  PE: Vitals with BMI 02/23/2021 02/15/2021 02/15/2021  Height 5' 7"  - -  Weight 203 lbs 3 oz - -  BMI 51.10 - -  Systolic 211 173 567  Diastolic 70 70 76  Pulse 71 57 59    Gen: Alert, well appearing.  Patient is oriented to person, place, time, and situation. AFFECT: pleasant, lucid thought and speech. CV: RRR, no m/r/g.   LUNGS: CTA bilat, nonlabored resps, good aeration in all lung fields. EXT: no clubbing or cyanosis.  no edema.    LABS:    Chemistry      Component Value Date/Time   NA 137 02/15/2021 0045   NA 143 04/19/2020 1146   K 3.9 02/15/2021 0045   CL 100 02/15/2021 0045   CO2 27 02/15/2021 0045   BUN 22 (H) 02/15/2021 0045   BUN 15 04/19/2020 1146   CREATININE 1.88 (H) 02/15/2021 0045   GLU 119 02/02/2020 0000      Component Value Date/Time   CALCIUM 8.9 02/15/2021 0045   CALCIUM 9.1 04/23/2011 0820   ALKPHOS 92 02/03/2021 1328   AST 17 02/03/2021 1328   ALT 14 02/03/2021 1328   BILITOT 0.7 02/03/2021 1328     Lab Results  Component Value Date   TSH 1.942 02/04/2021   Lab Results  Component Value Date   WBC 10.2 02/14/2021   HGB 15.0 02/14/2021   HCT 45.9 02/14/2021   MCV 89.3 02/14/2021   PLT 313 02/14/2021   Lab Results  Component Value Date   HGBA1C 6.1 (H) 02/04/2021   Lab Results  Component Value Date   CHOL 115 07/07/2020   HDL 63.50 07/07/2020   LDLCALC 34 07/07/2020   TRIG 83.0 07/07/2020   CHOLHDL 2  07/07/2020    IMPRESSION AND PLAN:  1) A. fib/flutter, recent hospitalization for rapid ventricular response. Converted to sinus rhythm on amiodarone and has continued this at 200 twice daily. He has Bystolic to take as needed increased heart rate.  He is taking Eliquis 5 mg twice a day. He is in discussions with the electrophysiologist about next step. Checking electrolytes and kidney function today.  #2 ischemic cardiomyopathy, EF 30 to 35%. He continues on maximal tolerated medical therapy under the direction of Dr. Haroldine Laws. He has torsemide 20 mg to take daily as needed fluid retention.  He is working on trying to get dosing appropriate to help fluid retention but not cause acute kidney injury.  He may try to take a 10 mg dose daily to see if it helps any better than as needed dosing.  #3: History of renal transplant, on tacrolimus, mycophenolate, and daily prednisone. Current tacrolimus dose is 0.82m bid (he hasn't taken it yet today)--will check tacrolimus level today due to the potential for this being altered by amiodarone.  #4: Chronic renal insufficiency stage III.  Creatinine range typically 1.4-1.7 for him.  Most recent creatinine on 02/15/2021 was 1.88. BMET today.  #5  Pericarditis.  Question due to his lupus.  He  does say he took the flu vaccine a few days prior to developing his symptoms so it is hard to tell if this was related. Symptoms have resolved and he continues on colchicine 0.6 mg daily.  An After Visit Summary was printed and given to the patient.  FOLLOW UP: Return for keep f/u set with me for 07/07/21.  Signed:  Crissie Sickles, MD           02/23/2021

## 2021-02-23 NOTE — Telephone Encounter (Signed)
Transitions of Care Pharmacy  ° °Call attempted for a pharmacy transitions of care follow-up. HIPAA appropriate voicemail was left with call back information provided.  ° °Call attempt #1. Will follow-up in 2-3 days.  °  °

## 2021-02-24 ENCOUNTER — Telehealth (HOSPITAL_COMMUNITY): Payer: Self-pay

## 2021-02-24 LAB — TACROLIMUS LEVEL: Tacrolimus (FK506), Blood: 8.2 ng/mL

## 2021-02-24 LAB — EXTRA SPECIMEN

## 2021-02-24 NOTE — Telephone Encounter (Signed)
Transitions of Care Pharmacy   Call attempted for a pharmacy transitions of care follow-up. HIPAA appropriate voicemail was left with call back information provided.   Call attempt #2. Will follow-up in 2-3 days.    

## 2021-02-27 ENCOUNTER — Other Ambulatory Visit: Payer: Self-pay

## 2021-02-27 ENCOUNTER — Telehealth (HOSPITAL_COMMUNITY): Payer: Self-pay

## 2021-02-27 MED ORDER — POTASSIUM CHLORIDE CRYS ER 20 MEQ PO TBCR: 20.0000 meq | EXTENDED_RELEASE_TABLET | ORAL | 3 refills | Status: AC | PRN

## 2021-02-27 MED ORDER — AMIODARONE HCL 200 MG PO TABS: 200.0000 mg | ORAL_TABLET | Freq: Two times a day (BID) | ORAL | 3 refills | Status: DC

## 2021-02-27 MED ORDER — DEXLANSOPRAZOLE 60 MG PO CPDR: 60.0000 mg | DELAYED_RELEASE_CAPSULE | Freq: Every day | ORAL | 3 refills | Status: AC

## 2021-02-27 MED ORDER — TORSEMIDE 20 MG PO TABS: 20.0000 mg | ORAL_TABLET | ORAL | 3 refills | Status: DC | PRN

## 2021-02-27 MED ORDER — COLCHICINE 0.6 MG PO TABS: 0.6000 mg | ORAL_TABLET | Freq: Every day | ORAL | 3 refills | Status: DC

## 2021-02-27 NOTE — Telephone Encounter (Signed)
Pharmacy Transitions of Care Follow-up Telephone Call  Date of discharge: 02/08/21  Discharge Diagnosis: A flutter with RVR  How have you been since you were released from the hospital?  Patient doing well since discharge. Already has refills at home pharmacy and has been on blood thinners in the past.  Medication changes made at discharge:     START taking: amiodarone (PACERONE)  colchicine  Eliquis (apixaban)  melatonin  STOP taking: aspirin EC 81 MG tablet  Bystolic 5 MG tablet (nebivolol)  dapagliflozin propanediol 10 MG Tabs tablet (Farxiga)  Dexilant 60 MG capsule (dexlansoprazole)  Entresto 49-51 MG (sacubitril-valsartan)  niacin 500 MG tablet  spironolactone 25 MG tablet (ALDACTONE)   Medication changes verified by the patient? Yes    Medication Accessibility:  Home Pharmacy: not discussed   Was the patient provided with refills on discharged medications? No   Have all prescriptions been transferred from [facility] to home pharmacy? N/a   Is the patient able to afford medications? Patient has insurance    Medication Review:  APIXABAN (ELIQUIS)  Apixaban 5 mg BID initiated on 02/08/21 .  - Discussed importance of taking medication around the same time everyday  - Advised patient of medications to avoid (NSAIDs, ASA)  - Educated that Tylenol (acetaminophen) will be the preferred analgesic to prevent risk of bleeding  - Emphasized importance of monitoring for signs and symptoms of bleeding (abnormal bruising, prolonged bleeding, nose bleeds, bleeding from gums, discolored urine, black tarry stools)  - Advised patient to alert all providers of anticoagulation therapy prior to starting a new medication or having a procedure   CLOPIDOGREL (PLAVIX) Clopidogrel 75 mg once daily.  - Educated patient on expected duration of therapy of ASA with clopidogrel.  - Advised patient of medications to avoid (NSAIDs, ASA)  - Educated that Tylenol (acetaminophen) will be the preferred  analgesic to prevent risk of bleeding  - Emphasized importance of monitoring for signs and symptoms of bleeding (abnormal bruising, prolonged bleeding, nose bleeds, bleeding from gums, discolored urine, black tarry stools)  - Advised patient to alert all providers of anticoagulation therapy prior to starting a new medication or having a procedure   Follow-up Appointments:  PCP Hospital f/u appt confirmed? Scheduled to see Dr. Anitra Lauth on 07/07/21 @ 8:30am.   [facility] f/u appt confirmed? Scheduled to see D. Camnitz on 03/28/21 @ 3:00pm.   If their condition worsens, is the pt aware to call PCP or go to the Emergency Dept.? yes  Final Patient Assessment: Patient has follow up scheduled and has refills at home pharmacy

## 2021-02-27 NOTE — Telephone Encounter (Signed)
Pt would like meds pending refilled for 90 d supply with refills. Potassium and Torsemide were d/c due to being advised to stop taking at discharge.  Please review and advise if refills appropriate.

## 2021-03-02 ENCOUNTER — Ambulatory Visit (INDEPENDENT_AMBULATORY_CARE_PROVIDER_SITE_OTHER): Payer: Managed Care, Other (non HMO)

## 2021-03-02 DIAGNOSIS — I255 Ischemic cardiomyopathy: Secondary | ICD-10-CM

## 2021-03-02 LAB — CUP PACEART REMOTE DEVICE CHECK
Battery Remaining Longevity: 116 mo
Battery Voltage: 3 V
Brady Statistic RV Percent Paced: 0.01 %
Date Time Interrogation Session: 20221020042306
HighPow Impedance: 46 Ohm
HighPow Impedance: 56 Ohm
Implantable Lead Implant Date: 20120709
Implantable Lead Location: 753860
Implantable Lead Model: 6947
Implantable Pulse Generator Implant Date: 20191129
Lead Channel Impedance Value: 285 Ohm
Lead Channel Impedance Value: 361 Ohm
Lead Channel Pacing Threshold Amplitude: 1.25 V
Lead Channel Pacing Threshold Pulse Width: 0.4 ms
Lead Channel Sensing Intrinsic Amplitude: 13.875 mV
Lead Channel Sensing Intrinsic Amplitude: 13.875 mV
Lead Channel Setting Pacing Amplitude: 2.5 V
Lead Channel Setting Pacing Pulse Width: 0.5 ms
Lead Channel Setting Sensing Sensitivity: 0.3 mV

## 2021-03-06 ENCOUNTER — Other Ambulatory Visit (HOSPITAL_COMMUNITY): Payer: Self-pay | Admitting: Family Medicine

## 2021-03-06 ENCOUNTER — Encounter (HOSPITAL_COMMUNITY): Payer: Self-pay

## 2021-03-06 ENCOUNTER — Telehealth: Payer: Self-pay

## 2021-03-06 ENCOUNTER — Ambulatory Visit (HOSPITAL_COMMUNITY)
Admission: RE | Admit: 2021-03-06 | Discharge: 2021-03-06 | Disposition: A | Discharge status: Home / Self Care | Payer: Managed Care, Other (non HMO) | Source: Ambulatory Visit | Attending: Family Medicine | Admitting: Family Medicine

## 2021-03-06 ENCOUNTER — Other Ambulatory Visit: Payer: Self-pay

## 2021-03-06 VITALS — BP 108/66 | HR 86 | Wt 196.6 lb

## 2021-03-06 DIAGNOSIS — I5022 Chronic systolic (congestive) heart failure: Secondary | ICD-10-CM | POA: Diagnosis not present

## 2021-03-06 DIAGNOSIS — N186 End stage renal disease: Secondary | ICD-10-CM | POA: Diagnosis not present

## 2021-03-06 DIAGNOSIS — I4892 Unspecified atrial flutter: Secondary | ICD-10-CM | POA: Insufficient documentation

## 2021-03-06 DIAGNOSIS — Z940 Kidney transplant status: Secondary | ICD-10-CM | POA: Diagnosis not present

## 2021-03-06 DIAGNOSIS — M3214 Glomerular disease in systemic lupus erythematosus: Secondary | ICD-10-CM | POA: Insufficient documentation

## 2021-03-06 DIAGNOSIS — Z7984 Long term (current) use of oral hypoglycemic drugs: Secondary | ICD-10-CM | POA: Diagnosis not present

## 2021-03-06 DIAGNOSIS — G4733 Obstructive sleep apnea (adult) (pediatric): Secondary | ICD-10-CM | POA: Diagnosis not present

## 2021-03-06 DIAGNOSIS — I480 Paroxysmal atrial fibrillation: Secondary | ICD-10-CM | POA: Diagnosis not present

## 2021-03-06 DIAGNOSIS — Z7902 Long term (current) use of antithrombotics/antiplatelets: Secondary | ICD-10-CM | POA: Diagnosis not present

## 2021-03-06 DIAGNOSIS — I493 Ventricular premature depolarization: Secondary | ICD-10-CM

## 2021-03-06 DIAGNOSIS — E785 Hyperlipidemia, unspecified: Secondary | ICD-10-CM | POA: Insufficient documentation

## 2021-03-06 DIAGNOSIS — Z7952 Long term (current) use of systemic steroids: Secondary | ICD-10-CM | POA: Diagnosis not present

## 2021-03-06 DIAGNOSIS — I255 Ischemic cardiomyopathy: Secondary | ICD-10-CM | POA: Insufficient documentation

## 2021-03-06 DIAGNOSIS — Z888 Allergy status to other drugs, medicaments and biological substances status: Secondary | ICD-10-CM | POA: Insufficient documentation

## 2021-03-06 DIAGNOSIS — Z79899 Other long term (current) drug therapy: Secondary | ICD-10-CM | POA: Insufficient documentation

## 2021-03-06 DIAGNOSIS — Z87891 Personal history of nicotine dependence: Secondary | ICD-10-CM | POA: Diagnosis not present

## 2021-03-06 DIAGNOSIS — I132 Hypertensive heart and chronic kidney disease with heart failure and with stage 5 chronic kidney disease, or end stage renal disease: Secondary | ICD-10-CM | POA: Insufficient documentation

## 2021-03-06 DIAGNOSIS — I2510 Atherosclerotic heart disease of native coronary artery without angina pectoris: Secondary | ICD-10-CM | POA: Insufficient documentation

## 2021-03-06 DIAGNOSIS — I252 Old myocardial infarction: Secondary | ICD-10-CM | POA: Insufficient documentation

## 2021-03-06 DIAGNOSIS — Z841 Family history of disorders of kidney and ureter: Secondary | ICD-10-CM | POA: Insufficient documentation

## 2021-03-06 DIAGNOSIS — Z7901 Long term (current) use of anticoagulants: Secondary | ICD-10-CM | POA: Diagnosis not present

## 2021-03-06 DIAGNOSIS — Z95810 Presence of automatic (implantable) cardiac defibrillator: Secondary | ICD-10-CM | POA: Insufficient documentation

## 2021-03-06 DIAGNOSIS — Z833 Family history of diabetes mellitus: Secondary | ICD-10-CM | POA: Diagnosis not present

## 2021-03-06 DIAGNOSIS — Z8249 Family history of ischemic heart disease and other diseases of the circulatory system: Secondary | ICD-10-CM | POA: Diagnosis not present

## 2021-03-06 DIAGNOSIS — Z955 Presence of coronary angioplasty implant and graft: Secondary | ICD-10-CM | POA: Insufficient documentation

## 2021-03-06 LAB — CBC
HCT: 52 % (ref 39.0–52.0)
Hemoglobin: 16.1 g/dL (ref 13.0–17.0)
MCH: 28.5 pg (ref 26.0–34.0)
MCHC: 31 g/dL (ref 30.0–36.0)
MCV: 92.2 fL (ref 80.0–100.0)
Platelets: 265 10*3/uL (ref 150–400)
RBC: 5.64 MIL/uL (ref 4.22–5.81)
RDW: 14.6 % (ref 11.5–15.5)
WBC: 10.8 10*3/uL — ABNORMAL HIGH (ref 4.0–10.5)
nRBC: 0 % (ref 0.0–0.2)

## 2021-03-06 LAB — BASIC METABOLIC PANEL
Anion gap: 13 (ref 5–15)
BUN: 14 mg/dL (ref 6–20)
CO2: 27 mmol/L (ref 22–32)
Calcium: 9.4 mg/dL (ref 8.9–10.3)
Chloride: 97 mmol/L — ABNORMAL LOW (ref 98–111)
Creatinine, Ser: 1.72 mg/dL — ABNORMAL HIGH (ref 0.61–1.24)
GFR, Estimated: 47 mL/min — ABNORMAL LOW (ref >60)
Glucose, Bld: 99 mg/dL (ref 70–99)
Potassium: 4.4 mmol/L (ref 3.5–5.1)
Sodium: 137 mmol/L (ref 135–145)

## 2021-03-06 LAB — MAGNESIUM: Magnesium: 2.1 mg/dL (ref 1.7–2.4)

## 2021-03-06 MED ORDER — ONDANSETRON HCL 4 MG PO TABS: 4.0000 mg | ORAL_TABLET | Freq: Three times a day (TID) | ORAL | 0 refills | Status: DC | PRN

## 2021-03-06 MED ORDER — BISOPROLOL FUMARATE 5 MG PO TABS: 2.5000 mg | ORAL_TABLET | Freq: Every day | ORAL | 2 refills | Status: DC

## 2021-03-06 MED ORDER — AMIODARONE HCL 200 MG PO TABS: 200.0000 mg | ORAL_TABLET | Freq: Every day | ORAL | 1 refills | Status: DC

## 2021-03-06 NOTE — Progress Notes (Addendum)
ADVANCED HF CLINIC  NOTE    Primary Care: [name], MD  Nephrology: [place] ([facility]) Primary Cardiologist: Johnsie Cancel HF Cardiologist: Dr. Haroldine Laws   HPI: Roy Dyer is a 55 y.o. male with a severe premature CAD, SLE s/p renal transplant 2012 ([facility]), OSA on CPAP and systolic HF EF 95%   His first MI was in 2001 with stenting to the RCA and OM. Had BMS to LAD in 2012. Catheterization 9/18 showed 90% lesion in mRCA -> DES, LAD and OM stents patent.   Echo12/17 EF 30%   Echo 4/21 EF 25- 30% with severe LAE with mild MR and moderate TR.    Cath 12/21 for worsening HF symptoms. Stable CAD. With frequent PVCs. No RHC  Zio 12/21: PVCs 13.4%  Echo 6/22 EF 30-35% Apex akinetic. G3 DD RV ok Personally reviewed  Follow up 8/22 low-dose spiro restarted and repeat Zio placed to follow PVC burden. Zio showed 11.8% PVCs of one morphology.  Admit 9/22 for new atrial flutter and acute pericarditis, converted to NSR w/ amio and treated w/ colchicine. Echo no effusion, EF 30-35%, RV normal.   Seen in ED 10/22 w/ recurrent, symptomatic atrial flutter w/ RVR HR 150s. He converted to SR with IV amio. EP consulted and discussed possibility of AFL ablation after GDMT optimized and he is off colchicine; weight 203 lbs.   Today he returns for HF follow up with his wife. Main complaint is cough and weight loss; appetite is poor and food does not taste good. He is feeling his palpitations more. He gets SOB with taking a shower or walking a flight of stairs. Denies  CP, abnormal bleeding, dizziness, edema. He sleeps upright due to acid reflux. Weight at home 191 pounds. Taking all medications. BP at home 120s/75s. He is taking torsemide 10 mg daily. He is not sure if he has fluid on him as he is losing weight.  ROS:  Please see the history of present illness.   All other systems are personally reviewed and negative.   Past Medical History:  Diagnosis Date   AICD (automatic  cardioverter/defibrillator) present    Dr. Paschal Dopp follows remotely-yrly checks, Dr. Jonne Ply   Atrial flutter ([place]) 01/2021   dx'd when in [place] for pericarditis->amiodarone+eliquis   Avascular necrosis of bone of hip ([place]) 2010 surg   Left hip arthroplasty: from chronic systemic steroids taken for his Lupus   Barrett's esophagus 2017; 2021   Blood transfusion without reported diagnosis 2010, 2012   CAD (coronary artery disease)    **DAPT long term recommended by cards**.  a. stents RCA/Circ 2001 b. BMS to LAD 07/2010  c. 01/2017: s/p DES to RCA.    Cataract    left eye   CHF (congestive heart failure) (HCC)    Chronic headaches 02/2017   As of 01/2017, HA's no better, neurologist ordered CT.  ESR normal at that time.  HA'S COMPLETELY RESOLVED AFTER HE GOT ON CPAP 2019/2020.   Chronic renal insufficiency, stage 3 (moderate) (HCC)    GFR 55-60 (sCr 1.4-1.7 range)   Erythrocytosis    Pre and post transplant->on losartan for this   GERD (gastroesophageal reflux disease)    Hx of esoph stricture and dilatation.  +Barrett's esophagus+hx of aspiration pneumonia-->to get rpt EGD when he comes off DAPT (utd as of 01/29/18)   Gout    s/p renal transplant he was weened off of his allopurinol.   Hiatal hernia    History of adenomatous polyp  of colon 09/2019   polyp x 1. Recall 5 yrs   History of end stage renal disease 04/24/2011   Secondary to SLE: HD 06/2011-10/2012 (then got renal transplant)   History of renal transplant 11/10/2012   10/2012 [facility]    Hyperlipidemia    Hypertension    implantable cardiac defibrillator single chamber    [employer]tr[employer] (due to low EF)   Ischemic cardiomyopathy    Chronic systolic dysfunction--  84/6659 EF 30-35%.   Single chamber ICD 11/20/10 ([name])   Left ventricular thrombus 2012   Re-eval 02/2011 showed thrombus RESOLVED, so coumadin was d/c'd (was on it for 25mo   Lupus ([place]    OSA on CPAP 2018   Dr. [name]9/2018: +OSA on home sleep studay; CPAP auto  titrate 5-20 started 05/2017.   Osteoporosis    Pericarditis associated with systemic lupus erythematosus [place] 01/2021   hosp->colchicine   Prediabetes 12/2019   A1c stable long term at 5.6-5.7 until jump up to 6.2% Aug 2021.    Current Meds  Medication Sig   allopurinol (ZYLOPRIM) 100 MG tablet Take 1 tablet (100 mg total) by mouth daily.   amiodarone (PACERONE) 200 MG tablet Take 1 tablet (200 mg total) by mouth 2 (two) times daily.   apixaban (ELIQUIS) 5 MG TABS tablet Take 1 tablet (5 mg total) by mouth 2 (two) times daily.   atorvastatin (LIPITOR) 80 MG tablet Take 80 mg by mouth at bedtime.   Ca Carbonate-Mag Hydroxide (ROLAIDS PO) Take 1 tablet by mouth daily as needed (heartburn).   clopidogrel (PLAVIX) 75 MG tablet TAKE 1 TABLET BY MOUTH DAILY   colchicine 0.6 MG tablet Take 1 tablet (0.6 mg total) by mouth daily.   dapagliflozin propanediol (FARXIGA) 10 MG TABS tablet Take 1 tablet (10 mg total) by mouth daily.   dexlansoprazole (DEXILANT) 60 MG capsule Take 1 capsule (60 mg total) by mouth daily.   famotidine (PEPCID) 20 MG tablet Take 40 mg by mouth at bedtime.    fluticasone (FLONASE) 50 MCG/ACT nasal spray Place 1 spray into both nostrils daily as needed for allergies.   magnesium oxide (MAG-OX) 400 MG tablet Take 400 mg by mouth daily.    melatonin 3 MG TABS tablet Take 1 tablet (3 mg total) by mouth at bedtime as needed. (Patient taking differently: Take 3 mg by mouth at bedtime as needed (sleep).)   mexiletine (MEXITIL) 150 MG capsule Take 2 capsules (300 mg total) by mouth 2 (two) times daily.   mycophenolate (CELLCEPT) 250 MG capsule Take 3 capsules (750 mg total) by mouth 2 (two) times daily.   nebivolol (BYSTOLIC) 5 MG tablet Take 5 mg by mouth as needed (increased heart rate).   nitroGLYCERIN (NITROSTAT) 0.4 MG SL tablet Place 1 tablet (0.4 mg total) under the tongue every 5 (five) minutes as needed for chest pain (DO NOT EXCEED 3 DOSES).   potassium chloride SA  (KLOR-CON) 20 MEQ tablet Take 1 tablet (20 mEq total) by mouth as needed (for heart failure symptoms per Dr. BJeffie Pollock.   predniSONE (DELTASONE) 5 MG tablet TAKE 1 TABLET BY MOUTH DAILY   tacrolimus (PROGRAF) 0.5 MG capsule Take 0.5 mg by mouth every 12 (twelve) hours.    torsemide (DEMADEX) 20 MG tablet Take 1 tablet (20 mg total) by mouth as needed (for heart failure symptoms per Dr. BJeffie Pollock.   traMADol (ULTRAM) 50 MG tablet Take 50 mg by mouth every 6 (six) hours as needed. for pain  Allergies  Allergen Reactions   Triptans Palpitations      Reaction to [place]      Social History         Socioeconomic History   Marital status: Married      Spouse name: Not on file   Number of children: 2   Years of education: Not on file   Highest education level: Not on file  Occupational History   Occupation: CAD Drafter       Employer: [employer]  Tobacco Use   Smoking status: Former Smoker      Packs/day: 0.50      Years: 30.00      Pack years: 15.00      Types: Cigarettes      Quit date: 08/02/2010      Years since quitting: 9.7   Smokeless tobacco: Never Used  Vaping Use   Vaping Use: Never used  Substance and Sexual Activity   Alcohol use: No      Alcohol/week: 0.0 standard drinks   Drug use: No   Sexual activity: Yes      Partners: Female  Other Topics Concern   Not on file  Social History Narrative    Married, 1 teenage son and 1 teenage daughter.     Occupation: Printmaker.    15 pack-yr smoking hx, quit 07/2010.    Drug Use - no    No alcohol.                   Social Determinants of Health    Financial Resource Strain: Not on file  Food Insecurity: Not on file  Transportation Needs: Not on file  Physical Activity: Not on file  Stress: Not on file  Social Connections: Not on file  Intimate Partner Violence: Not on file           Family History  Problem Relation Age of Onset   Kidney failure Mother     Lupus Mother     Stroke Mother      Diabetes Mother          type 2   Heart attack Father          X 7   Hypertension Father     Hyperlipidemia Father     Heart attack Sister 83        X 1   Hyperlipidemia Sister     Hypertension Sister     Heart disease Sister     Heart attack Paternal Grandfather     Heart disease Paternal Aunt     Heart disease Paternal Uncle     Colon cancer Neg Hx     Esophageal cancer Neg Hx     Stomach cancer Neg Hx     Rectal cancer Neg Hx      BP 108/66   Pulse 86   Wt 89.2 kg   SpO2 98%   BMI 30.79 kg/m   Wt Readings from Last 3 Encounters:  03/06/21 89.2 kg  02/23/21 92.2 kg  02/15/21 93.9 kg   PHYSICAL EXAM: General:  NAD. No resp difficulty HEENT: Normal Neck: Supple. No JVD. Carotids 2+ bilat; no bruits. No lymphadenopathy or thryomegaly appreciated. Cor: PMI nondisplaced. Regular rate & rhythm. No rubs, gallops or murmurs. Lungs: Clear Abdomen: Soft, nontender, nondistended. No hepatosplenomegaly. No bruits or masses. Good bowel sounds. Extremities: No cyanosis, clubbing, rash, edema; LUA fistula Neuro: Alert & oriented x 3, cranial nerves grossly intact. Moves all 4 extremities  w/o difficulty. Affect pleasant.  ICD interrogation: Optivol low. No VT. Activity 2hr/day (personally reviewed).  ECG: SR w/ PVC AVB (personally reviewed)  ASSESSMENT & PLAN:   1. Recurrent Atrial Flutter w/ RVR - recent admit for new AFL 9/22, converted on amio gtt - now w/ breakthrough AFL despite compliance w/ PO amio, 200 mg bid. V-rates 150s on admit - SR w/ PVC today on ECG - Restart bisoprolol 2.5 mg daily. - Continue Eliquis 5 mg bid. No bleeding issues - Decrease amiodarone to 200 mg daily. PRN Zofran for nausea. - Has follow up with EF for potential AFL ablation, rhythm control may be difficult given severely dilated LA. Recent echo measured 5.50 cm.  - Fully compliant w/ CPAP  - CBC & Mag today.   2. Chronic Systolic Heart Failure  - HX ICD in 2012 - Echo 12/17 EF 30%  -  Cath 04/22/20 for worsening HF symptoms. Stable CAD. - Echo 6/22 EF 30-35%, Grade III DD, severe LAE, mild/mod TR - Echo 02/04/21: EF 30-35% No effusion, RV normal. - NYHA Class II-early III, euvolemic on exam and by device interrogation. - We discussed changing torsemide to 10 mg every other day or just PRN weight gain/edema.  - Restart bisoprolol 2.5 mg daily. - Continue Farxiga 10 mg daily. - Continue to hold Entresto and spiro with recent AKI.  Plan to add back as able.  - With steady weight loss, will have device RN send transmission in 2 weeks to follow volume. - BMET today.   3. Pericarditis  - Diagnosed previous admit 9/22. Echo showed no effusion.  - No CP. - Continue colchicine 0.6 mg daily x 3 months (~04/2021). No NSAIDs as he is post renal tx    4. CKD 3a s/p renal transplant in 2012 (due to SLE): - Follows with nephrology, Dr. Clover Mealy and Dr. Lissa Merlin at [facility]. - Baseline Creatinine 1.4-1.6. - Continue prednisone, Prograf and CellCept. - Labs today.   5.  CAD with prior MI x2 and ischemic cardiomyopathy - Multiple stents to LAD, RCA and LCX - Cath 04/22/20 for worsening HF symptoms with stable disease - No ASA given Eliquis  - Continue Plavix + statin. - Add beta blocker as above.   6. PVCs - Zio 12/21 13.4% PVC burden on Zio with runs of SVT & NSVT. - PVCs reduced on mexilitene 300 mg bid.  - Zio (5/22): PVC 11.6 % burden - Zio (8/22): PVC 11.8% burden - Better suppressed w/ amio and mexiletine. - Continue CPAP QHS. - Restart bisoprolol.  - Keep K > 4.0 and Mg > 2.0  - Labs today.   Follow up with Dr. Haroldine Laws as scheduled in 2 months.   Allena Katz, FNP-BC 03/06/21   Patient seen and examined with the above-signed Advanced Practice Provider and/or Housestaff. I personally reviewed laboratory data, imaging studies and relevant notes. I independently examined the patient and formulated the important aspects of the plan. I have edited the note to reflect  any of my changes or salient points. I have personally discussed the plan with the patient and/or family.  Remains in NSR on po amio 200 bid. Volume status well controlled. NYHA II. Main issue is nausea.   General:  Well appearing. No resp difficulty HEENT: normal Neck: supple. no JVD. Carotids 2+ bilat; no bruits. No lymphadenopathy or thryomegaly appreciated. Cor: PMI nondisplaced. Regular rate & rhythm. No rubs, gallops or murmurs. Lungs: clear Abdomen: soft, nontender, nondistended. No hepatosplenomegaly. No bruits or masses. Good  bowel sounds. Extremities: no cyanosis, clubbing, rash, edema Neuro: alert & orientedx3, cranial nerves grossly intact. moves all 4 extremities w/o difficulty. Affect pleasant  He remains in NSR. Volume looks good. NYHA II. Having nausea which may be related to amio. Decrease amio to 200 daily. Can give prn zofran. ICD interrogated in clinic. Volume ok. No AF/VT.   He has f/u with EP soon. He has had both AFl and AF but AFL really has been major issue and suspect he would benefit from AFL ablation.   [name], MD  2:14 PM

## 2021-03-06 NOTE — Patient Instructions (Addendum)
EKG was done today  Labs were done today, if any labs are abnormal the clinic will call you  DECREASE Amiodarone 200 mg 1 tablet daily  START Zofran 4 mg 1 tablet every 8 hours as needed  You are able to STOP your Torsemide or you can try as needed or 10 mg every other day  Continue Colchicine for 3 months until you follow up with Dr. Haroldine Laws   At the [facility], you and your health needs are our priority. As part of our continuing mission to provide you with exceptional heart care, we have created designated Provider Care Teams. These Care Teams include your primary Cardiologist (physician) and Advanced Practice Providers (APPs- Physician Assistants and Nurse Practitioners) who all work together to provide you with the care you need, when you need it.   You may see any of the following providers on your designated Care Team at your next follow up: Dr [name] Dr Haynes Kerns, NP Lyda Jester, [place] [facility] [place], [place] Audry Riles, PharmD   Please be sure to bring in all your medications bottles to every appointment.    If you have any questions or concerns before your next appointment please send [place] a message through [place] or call our office at [phone].    TO LEAVE A MESSAGE FOR THE NURSE SELECT OPTION 2, PLEASE LEAVE A MESSAGE INCLUDING: YOUR NAME DATE OF BIRTH CALL BACK NUMBER REASON FOR CALL**this is important as we prioritize the call backs  YOU WILL RECEIVE A CALL BACK THE SAME DAY AS LONG AS YOU CALL BEFORE 4:00 PM

## 2021-03-06 NOTE — Telephone Encounter (Signed)
Nothing further needed, no clarification needed.

## 2021-03-06 NOTE — Telephone Encounter (Signed)
[employer] calling to clarify directions.  Please call [phone] ref # [id number]

## 2021-03-10 NOTE — Progress Notes (Signed)
Remote ICD transmission.   

## 2021-03-13 ENCOUNTER — Other Ambulatory Visit: Payer: Self-pay | Admitting: Family Medicine

## 2021-03-24 ENCOUNTER — Telehealth: Payer: Self-pay

## 2021-03-24 NOTE — Telephone Encounter (Signed)
Attempted ICM call to patient on both numbers listed to request to send manual remote transmission. No answer and neither phone number has voice mail option.

## 2021-03-24 NOTE — Telephone Encounter (Signed)
[place], Maricela Bo, FNP  Correne Lalani Panda, RN Leonie Man,   Can you send over a device transmission in 2 weeks please?  Thanks!

## 2021-03-28 ENCOUNTER — Other Ambulatory Visit: Payer: Self-pay

## 2021-03-28 ENCOUNTER — Encounter: Payer: Self-pay | Admitting: Cardiology

## 2021-03-28 ENCOUNTER — Ambulatory Visit: Payer: Managed Care, Other (non HMO) | Admitting: Cardiology

## 2021-03-28 VITALS — BP 118/64 | HR 63 | Resp 18 | Ht 67.0 in | Wt 194.0 lb

## 2021-03-28 DIAGNOSIS — I4819 Other persistent atrial fibrillation: Secondary | ICD-10-CM

## 2021-03-28 LAB — CUP PACEART INCLINIC DEVICE CHECK
Battery Remaining Longevity: 112 mo
Battery Voltage: 2.99 V
Brady Statistic RV Percent Paced: 0.18 %
Date Time Interrogation Session: 20221115163251
HighPow Impedance: 50 Ohm
HighPow Impedance: 61 Ohm
Implantable Lead Implant Date: 20120709
Implantable Lead Location: 753860
Implantable Lead Model: 6947
Implantable Pulse Generator Implant Date: 20191129
Lead Channel Impedance Value: 304 Ohm
Lead Channel Impedance Value: 361 Ohm
Lead Channel Pacing Threshold Amplitude: 1.375 V
Lead Channel Pacing Threshold Pulse Width: 0.4 ms
Lead Channel Sensing Intrinsic Amplitude: 13.875 mV
Lead Channel Setting Pacing Amplitude: 2.5 V
Lead Channel Setting Pacing Pulse Width: 0.5 ms
Lead Channel Setting Sensing Sensitivity: 0.3 mV

## 2021-03-28 MED ORDER — AMIODARONE HCL 200 MG PO TABS
100.0000 mg | ORAL_TABLET | Freq: Every day | ORAL | 3 refills | Status: AC
Start: 2021-03-28 — End: ?

## 2021-03-28 NOTE — Progress Notes (Signed)
Electrophysiology Office Note   Date:  03/28/2021   ID:  Roy Dyer, DOB 1965/09/25, MRN [id number]  PCP:  [name], MD  Cardiologist:  Eber Jones Primary Electrophysiologist:  Vardaan Depascale [name], MD    Chief Complaint: Atrial fibrillation   History of Present Illness: Roy Dyer is a 55 y.o. male who is being seen today for the evaluation of atrial fibrillation at the request of McGowen, [name], MD. Presenting today for electrophysiology evaluation.  He has a history significant for atrial flutter, pleuritic chest pain, ischemic cardiomyopathy coronary artery disease with multiple stents, CKD, atrial fibrillation.  He was admitted September 22 2 with new onset atrial fibrillation and acute pericarditis type symptoms.  He he converted to sinus rhythm on amiodarone.  Echo showed an ejection fraction of 30 to 35% with a normal right ventricle.  He subsequently represented to the emergency room with heart rates of 150 by his apple watch.  He was started on amiodarone and converted to sinus rhythm overnight.  Today, he denies symptoms of palpitations, chest pain, shortness of breath, orthopnea, PND, lower extremity edema, claudication, dizziness, presyncope, syncope, bleeding, or neurologic sequela. The patient is tolerating medications without difficulties.  He has not noted any further episodes of atrial fibrillation.  He also states that his PVC burden he feels has improved.  He has no chest pain or shortness of breath.  His main complaint is GI upset.  He attributes this to his amiodarone.  His GI upset improved after decreasing the amiodarone from 400 mg to 200 mg a day.   Past Medical History:  Diagnosis Date   AICD (automatic cardioverter/defibrillator) present    Dr. Paschal Dopp follows remotely-yrly checks, Dr. Jonne Ply   Atrial flutter ([place]) 01/2021   dx'd when in [place] for pericarditis->amiodarone+eliquis   Avascular necrosis of bone of hip ([place]) 2010  surg   Left hip arthroplasty: from chronic systemic steroids taken for his Lupus   Barrett's esophagus 2017; 2021   Blood transfusion without reported diagnosis 2010, 2012   CAD (coronary artery disease)    **DAPT long term recommended by cards**.  a. stents RCA/Circ 2001 b. BMS to LAD 07/2010  c. 01/2017: s/p DES to RCA.    Cataract    left eye   CHF (congestive heart failure) (HCC)    Chronic headaches 02/2017   As of 01/2017, HA's no better, neurologist ordered CT.  ESR normal at that time.  HA'S COMPLETELY RESOLVED AFTER HE GOT ON CPAP 2019/2020.   Chronic renal insufficiency, stage 3 (moderate) (HCC)    GFR 55-60 (sCr 1.4-1.7 range)   Erythrocytosis    Pre and post transplant->on losartan for this   GERD (gastroesophageal reflux disease)    Hx of esoph stricture and dilatation.  +Barrett's esophagus+hx of aspiration pneumonia-->to get rpt EGD when he comes off DAPT (utd as of 01/29/18)   Gout    s/p renal transplant he was weened off of his allopurinol.   Hiatal hernia    History of adenomatous polyp of colon 09/2019   polyp x 1. Recall 5 yrs   History of end stage renal disease 04/24/2011   Secondary to SLE: HD 06/2011-10/2012 (then got renal transplant)   History of renal transplant 11/10/2012   10/2012 [facility]    Hyperlipidemia    Hypertension    implantable cardiac defibrillator single chamber    Medtronic (due to low EF)   Ischemic cardiomyopathy    Chronic systolic dysfunction--  39/5320  EF 30-35%.   Single chamber ICD 11/20/10 ([name])   Left ventricular thrombus 2012   Re-eval 02/2011 showed thrombus RESOLVED, so coumadin was d/c'd (was on it for 86mo   [place]    OSA on CPAP 2018   Dr. [name]9/2018: +OSA on home sleep studay; CPAP auto titrate 5-20 started 05/2017.   Osteoporosis    Pericarditis associated with systemic lupus erythematosus ([place] 01/2021   hosp->colchicine   Prediabetes 12/2019   A1c stable long term at 5.6-5.7 until jump up to 6.2% Aug 2021.   Past  Surgical History:  Procedure Laterality Date   AV FISTULA PLACEMENT  03/28/2011   Procedure: ARTERIOVENOUS (AV) FISTULA CREATION;  Surgeon: TRosetta Posner MD;  [place] [facility]  Service: Vascular;  Laterality: Left;  Creation of Left radiocephallic cimino fistula   AV FISTULA PLACEMENT  04/27/2011   Procedure: ARTERIOVENOUS (AV) FISTULA CREATION;  Surgeon: TRosetta Posner MD;  [place]  Service: Vascular;  Laterality: Left;  left basilic vein transposition   BIOPSY  09/14/2019   Procedure: BIOPSY;  Surgeon: PJerene Bears MD;  [facility];  Service: Gastroenterology;;   CARDIAC CATHETERIZATION  08/2010; 04/22/20   08/2010; 04/2020->mild/mod in-stent restenosis but none hemodynamically signif, severe LV dysfxn, frequenct PVCs-->referred to HF clinic for optimization of med mgmt   CARDIOVASCULAR STRESS TEST  07/2010; 03/2014   03/2014 showed large old infarct and EF 30-35% but no ischemia   COLONOSCOPY  09/14/2019   Adenoma x 1.  Recall 5 yrs   COLONOSCOPY WITH PROPOFOL N/A 09/14/2019   Procedure: COLONOSCOPY WITH PROPOFOL;  Surgeon: PJerene Bears MD;  [facility];  Service: Gastroenterology;  Laterality: N/A;   CORONARY PRESSURE WIRE/FFR WITH 3D MAPPING N/A 04/22/2020   Procedure: Coronary Pressure Wire/FFR w/3D Mapping;  Surgeon: AWellington Hampshire MD;  [facility];  Service: Cardiovascular;  Laterality: N/A;   DEXA  07/05/2017   Normal bone density in radius and spine.   ESOPHAGOGASTRODUODENOSCOPY  02/2009   Done by Dr. SFuller Planfor hematemesis; esophagitis found.  Repeat recommended 09/2016, at which time he Annslee Tercero also likely get his initial screening colonoscopy.   ESOPHAGOGASTRODUODENOSCOPY  09/14/2019   esophagitis (improved compared to 2017 EGD), Barrett's.  Stomach and duodenum normal.   ESOPHAGOGASTRODUODENOSCOPY (EGD) WITH PROPOFOL N/A 09/29/2015   REPEAT EGD RECOMMENDED 09/2016.  Barrett's esoph + mild chronic gastritis.  H pylori NEG. Procedure:  ESOPHAGOGASTRODUODENOSCOPY (EGD) WITH PROPOFOL;  Surgeon: JJerene Bears MD;  [facility];  Service: Gastroenterology;  Laterality: N/A;   ESOPHAGOGASTRODUODENOSCOPY (EGD) WITH PROPOFOL N/A 09/14/2019   Procedure: ESOPHAGOGASTRODUODENOSCOPY (EGD) WITH PROPOFOL;  Surgeon: PJerene Bears MD;  [facility];  Service: Gastroenterology;  Laterality: N/A;   ICD GENERATOR CHANGEOUT N/A 04/11/2018   Procedure: ICD GENERATOR CHANGEOUT;  Surgeon: KDeboraha Sprang MD;  [facility];  Service: Cardiovascular;  Laterality: N/A;   INSERT / REPLACE / REMOVE PACEMAKER     medtronic        dr nFrances Nickels      icd only   KIDNEY TRANSPLANT  10/2012   [facility] nephrologist--Dr. MBlair Heys  LEFT HEART CATH AND CORONARY ANGIOGRAPHY N/A 02/08/2017   PCA and DES to mRCA--needs DAPT for at least 6 mo.   Procedure: LEFT HEART CATH AND CORONARY ANGIOGRAPHY;  Surgeon: CSherren Mocha MD;  [place];  Service: Cardiovascular;  Laterality: N/A;   LEFT HEART CATH AND  CORONARY ANGIOGRAPHY N/A 04/22/2020   Procedure: LEFT HEART CATH AND CORONARY ANGIOGRAPHY;  Surgeon: Wellington Hampshire, MD;  Location: [place] [facility];  Service: Cardiovascular;  Laterality: N/A;   PARTIAL HIP ARTHROPLASTY Left 2010   left   POLYPECTOMY  09/14/2019   Procedure: POLYPECTOMY;  Surgeon: Jerene Bears, MD;  [facility];  Service: Gastroenterology;;   RENAL BIOPSY     stents     05-2010 and 2- in 2010 and 1 in 2018.   TOTAL HIP ARTHROPLASTY  07/09/2011   Procedure: TOTAL HIP ARTHROPLASTY;  Surgeon: Kerin Salen, MD;  Location: [place];  Service: Orthopedics;  Laterality: Right;   TRANSTHORACIC ECHOCARDIOGRAM  10/13/2020   EF 30-35% (slight improved), grd III DD, elev PA pressure, mold/mod tricusp regurg   TYMPANOSTOMY TUBE PLACEMENT  55 yrs old   US ECHOCARDIOGRAPHY  12/2010; 02/2014;08/2014;08/2019   02/2014 EF still 25-30%, increased PA pressures.  2016 EF 30-35%, sept/inf  hypokinesis, mod tricusp regurg. 08/2019 EF 25-30%, global hypok, sev dil LV w/apical aneur, grd III DD, mod tricus reg, mod pulm htn   ZIO PATCH  04/2020   HIGH PVC BURDEN.  NSVT, SVT, BBB.     Current Outpatient Medications  Medication Sig Dispense Refill   allopurinol (ZYLOPRIM) 100 MG tablet Take 1 tablet (100 mg total) by mouth daily. 90 tablet 3   apixaban (ELIQUIS) 5 MG TABS tablet Take 1 tablet (5 mg total) by mouth 2 (two) times daily. 60 tablet 11   atorvastatin (LIPITOR) 80 MG tablet Take 80 mg by mouth at bedtime.     Ca Carbonate-Mag Hydroxide (ROLAIDS PO) Take 1 tablet by mouth daily as needed (heartburn).     clopidogrel (PLAVIX) 75 MG tablet TAKE 1 TABLET BY MOUTH DAILY 90 tablet 3   colchicine 0.6 MG tablet Take 1 tablet (0.6 mg total) by mouth daily. 90 tablet 3   dexlansoprazole (DEXILANT) 60 MG capsule Take 1 capsule (60 mg total) by mouth daily. 90 capsule 3   famotidine (PEPCID) 20 MG tablet Take 40 mg by mouth at bedtime.      FARXIGA 10 MG TABS tablet TAKE 1 TABLET DAILY BEFORE BREAKFAST 90 tablet 3   fluticasone (FLONASE) 50 MCG/ACT nasal spray Place 1 spray into both nostrils daily as needed for allergies.     magnesium oxide (MAG-OX) 400 MG tablet Take 400 mg by mouth daily.      melatonin 3 MG TABS tablet Take 1 tablet (3 mg total) by mouth at bedtime as needed. (Patient taking differently: Take 3 mg by mouth at bedtime as needed (sleep).)  0   mexiletine (MEXITIL) 150 MG capsule Take 2 capsules (300 mg total) by mouth 2 (two) times daily. 360 capsule 3   mycophenolate (CELLCEPT) 250 MG capsule Take 3 capsules (750 mg total) by mouth 2 (two) times daily. 540 capsule 1   nebivolol (BYSTOLIC) 5 MG tablet Take 2.5 mg by mouth as needed (increased heart rate).     nitroGLYCERIN (NITROSTAT) 0.4 MG SL tablet Place 1 tablet (0.4 mg total) under the tongue every 5 (five) minutes as needed for chest pain (DO NOT EXCEED 3 DOSES). 25 tablet 3   potassium chloride SA (KLOR-CON)  20 MEQ tablet Take 1 tablet (20 mEq total) by mouth as needed (for heart failure symptoms per Dr. Jeffie Pollock). 90 tablet 3   predniSONE (DELTASONE) 5 MG tablet TAKE 1 TABLET BY MOUTH DAILY 90 tablet 3   tacrolimus (PROGRAF) 0.5 MG capsule  Take 0.5 mg by mouth every 12 (twelve) hours.      torsemide (DEMADEX) 20 MG tablet Take 1 tablet (20 mg total) by mouth as needed (for heart failure symptoms per Dr. Jeffie Pollock). 90 tablet 3   traMADol (ULTRAM) 50 MG tablet Take 50 mg by mouth every 6 (six) hours as needed. for pain  0   bisoprolol (ZEBETA) 5 MG tablet Take 0.5 tablets (2.5 mg total) by mouth daily. (Patient not taking: Reported on 03/28/2021) 30 tablet 2   No current facility-administered medications for this visit.    Allergies:   Triptans   Social History:  The patient  reports that he quit smoking about 10 years ago. His smoking use included cigarettes. He has a 15.00 pack-year smoking history. He has never used smokeless tobacco. He reports that he does not drink alcohol and does not use drugs.   Family History:  The patient's family history includes Diabetes in his mother; Heart attack in his father and paternal grandfather; Heart attack (age of onset: 1) in his sister; Heart disease in his paternal aunt, paternal uncle, and sister; Hyperlipidemia in his father and sister; Hypertension in his father and sister; Kidney failure in his mother; Lupus in his mother; Stroke in his mother.    ROS:  Please see the history of present illness.   Otherwise, review of systems is positive for none.   All other systems are reviewed and negative.    PHYSICAL EXAM: VS:  BP 118/64   Pulse 63   Resp 18   Ht 5' 7"  (1.702 m)   Wt 194 lb (88 kg)   SpO2 98%   BMI 30.38 kg/m  , BMI Body mass index is 30.38 kg/m. GEN: Well nourished, well developed, in no acute distress  HEENT: normal  Neck: no JVD, carotid bruits, or masses Cardiac: RRR; no murmurs, rubs, or gallops,no edema  Respiratory:  clear to  auscultation bilaterally, normal work of breathing GI: soft, nontender, nondistended, + BS MS: no deformity or atrophy  Skin: warm and dry, device pocket is well healed Neuro:  Strength and sensation are intact Psych: euthymic mood, full affect  EKG:  EKG is ordered today. Personal review of the ekg ordered shows sinus rhythm  Device interrogation is reviewed today in detail.  See PaceArt for details.   Recent Labs: 02/03/2021: ALT 14 02/04/2021: TSH 1.942 02/14/2021: B Natriuretic Peptide 1,742.3 03/06/2021: BUN 14; Creatinine, Ser 1.72; Hemoglobin 16.1; Magnesium 2.1; Platelets 265; Potassium 4.4; Sodium 137    Lipid Panel     Component Value Date/Time   CHOL 115 07/07/2020 0827   TRIG 83.0 07/07/2020 0827   HDL 63.50 07/07/2020 0827   CHOLHDL 2 07/07/2020 0827   VLDL 16.6 07/07/2020 0827   LDLCALC 34 07/07/2020 0827     Wt Readings from Last 3 Encounters:  03/28/21 194 lb (88 kg)  03/06/21 196 lb 9.6 oz (89.2 kg)  02/23/21 203 lb 3.2 oz (92.2 kg)      Other studies Reviewed: Additional studies/ records that were reviewed today include: TTE 02/04/21  Review of the above records today demonstrates:   1. Left ventricular ejection fraction, by estimation, is 30 to 35%. The  left ventricle has moderately decreased function. The left ventricle  demonstrates global hypokinesis. The left ventricular internal cavity size  was moderately dilated. There is mild   left ventricular hypertrophy. Left ventricular diastolic parameters are  consistent with Grade II diastolic dysfunction (pseudonormalization).   2. Right ventricular systolic  function is normal. The right ventricular  size is normal. There is moderately elevated pulmonary artery systolic  pressure. The estimated right ventricular systolic pressure is 67.3 mmHg.   3. Left atrial size was severely dilated.   4. Right atrial size was severely dilated.   5. The mitral valve is normal in structure. Trivial mitral valve   regurgitation. No evidence of mitral stenosis.   6. Tricuspid valve regurgitation is moderate.   7. The aortic valve is normal in structure. Aortic valve regurgitation is  not visualized. Mild aortic valve sclerosis is present, with no evidence  of aortic valve stenosis.   8. The inferior vena cava is dilated in size with <50% respiratory  variability, suggesting right atrial pressure of 15 mmHg.   Cardiac monitor 01/21/2021 personally reviewed 1. Sinus rhythm with 1AV block - avg HR of 59 bpm.  2. Four runs of non-sustained VT 4 The run with the fastest interval lasting 10 beats with a max rate of 154 bpm (avg 130 bpm); the run with the fastest interval was also the longest. 3. Rare PACs. 4.  Isolated SVEs were rare (<1.0%) 5. Frequent PVCs (11.8%, [id number]) with one morphology  6. Ventricular Couplets were  occasional (4.2%, 12144), and VE Triplets were rare (<1.0%, 386). Ventricular Bigeminy and Trigeminy were present.  ASSESSMENT AND PLAN:  1.  Persistent atrial fibrillation/atrial flutter: Currently on bisoprolol 2.5 mg daily, amiodarone 200 mg daily, Eliquis 2.5 mg twice daily.  High risk medication monitoring for amiodarone.  CHA2DS2-VASc of 3.  He has maintained sinus rhythm since his hospitalization.  He is unfortunately had symptoms of GI upset on amiodarone.  We Bettymae Yott reduce the dose to 100 mg daily.  He would be a candidate for ablation, would like to give it a little bit longer to see if he Danean Marner have some positive remodeling.  I Jadence Kinlaw see him back in 3 months for further discussions.  2.  PVCs/nonsustained VT: Cardiac monitor with a 4% burden.  Currently on mexiletine 150 mg twice daily and amiodarone as above.  High risk medication monitoring via ECG.  3.  Ischemic cardiomyopathy: Status post Medtronic ICD.  Currently on optimal medical therapy.  4.  Coronary artery disease: Multiple stents to the LAD, RCA, circumflex.  No ischemic symptoms.  5.  Pericarditis: Found on admission  to the hospital September 2022.  Therapy per primary cardiology.  Current medicines are reviewed at length with the patient today.   The patient does not have concerns regarding his medicines.  The following changes were made today: Decrease amiodarone  Labs/ tests ordered today include:  Orders Placed This Encounter  Procedures   EKG 12-Lead     Disposition:   FU with Acxel Dingee 3 months  Signed, Lesia Monica [name], MD  03/28/2021 3:40 PM     [facility] [address] [place] [address] [phone] (office) [phone] (fax)

## 2021-03-28 NOTE — Patient Instructions (Signed)
Medication Instructions:  Your physician has recommended you make the following change in your medication: DECREASE Amiodarone to 100 mg once daily  *If you need a refill on your cardiac medications before your next appointment, please call your pharmacy*   Lab Work: None ordered   Testing/Procedures: None ordered   Follow-Up: At [facility], you and your health needs are our priority.  As part of our continuing mission to provide you with exceptional heart care, we have created designated Provider Care Teams.  These Care Teams include your primary Cardiologist (physician) and Advanced Practice Providers (APPs -  Physician Assistants and Nurse Practitioners) who all work together to provide you with the care you need, when you need it.  Your next appointment:   3 month(s)  The format for your next appointment:   In Person  Provider:   Allegra Lai, MD    Thank you for choosing [facility]!!   Trinidad Curet, RN [phone]   Other Instructions

## 2021-03-29 NOTE — Telephone Encounter (Signed)
Unable to reach patient at home to send remote transmission.  Following thoracic impedance from 11/15 OV with Dr Curt Bears suggesting fluid levels returned to normal.    Fluid index returned to normal threshold.    Copy sent to [facility], NP for 2 week follow up after last OV.

## 2021-04-13 LAB — CBC AND DIFFERENTIAL
HCT: 48 (ref 41–53)
Hemoglobin: 14.6 (ref 13.5–17.5)
Platelets: 234 (ref 150–399)
WBC: 13.2

## 2021-05-02 ENCOUNTER — Other Ambulatory Visit: Payer: Self-pay

## 2021-05-02 ENCOUNTER — Ambulatory Visit (HOSPITAL_COMMUNITY)
Admission: RE | Admit: 2021-05-02 | Discharge: 2021-05-02 | Disposition: A | Discharge status: Home / Self Care | Payer: Managed Care, Other (non HMO) | Source: Ambulatory Visit | Attending: Internal Medicine | Admitting: Internal Medicine

## 2021-05-02 ENCOUNTER — Encounter (HOSPITAL_COMMUNITY): Payer: Self-pay | Admitting: Internal Medicine

## 2021-05-02 VITALS — BP 140/80 | HR 65 | Wt 194.2 lb

## 2021-05-02 DIAGNOSIS — N1831 Chronic kidney disease, stage 3a: Secondary | ICD-10-CM

## 2021-05-02 DIAGNOSIS — I2510 Atherosclerotic heart disease of native coronary artery without angina pectoris: Secondary | ICD-10-CM | POA: Insufficient documentation

## 2021-05-02 DIAGNOSIS — Z940 Kidney transplant status: Secondary | ICD-10-CM | POA: Diagnosis not present

## 2021-05-02 DIAGNOSIS — Z955 Presence of coronary angioplasty implant and graft: Secondary | ICD-10-CM | POA: Insufficient documentation

## 2021-05-02 DIAGNOSIS — I5022 Chronic systolic (congestive) heart failure: Secondary | ICD-10-CM | POA: Insufficient documentation

## 2021-05-02 DIAGNOSIS — I471 Supraventricular tachycardia: Secondary | ICD-10-CM | POA: Diagnosis not present

## 2021-05-02 DIAGNOSIS — I255 Ischemic cardiomyopathy: Secondary | ICD-10-CM | POA: Diagnosis not present

## 2021-05-02 DIAGNOSIS — I252 Old myocardial infarction: Secondary | ICD-10-CM | POA: Diagnosis not present

## 2021-05-02 DIAGNOSIS — Z95810 Presence of automatic (implantable) cardiac defibrillator: Secondary | ICD-10-CM | POA: Insufficient documentation

## 2021-05-02 DIAGNOSIS — I472 Ventricular tachycardia, unspecified: Secondary | ICD-10-CM | POA: Diagnosis not present

## 2021-05-02 DIAGNOSIS — I132 Hypertensive heart and chronic kidney disease with heart failure and with stage 5 chronic kidney disease, or end stage renal disease: Secondary | ICD-10-CM | POA: Diagnosis not present

## 2021-05-02 DIAGNOSIS — I493 Ventricular premature depolarization: Secondary | ICD-10-CM | POA: Diagnosis not present

## 2021-05-02 DIAGNOSIS — Z9989 Dependence on other enabling machines and devices: Secondary | ICD-10-CM | POA: Diagnosis not present

## 2021-05-02 DIAGNOSIS — I319 Disease of pericardium, unspecified: Secondary | ICD-10-CM | POA: Insufficient documentation

## 2021-05-02 DIAGNOSIS — M3214 Glomerular disease in systemic lupus erythematosus: Secondary | ICD-10-CM | POA: Diagnosis not present

## 2021-05-02 DIAGNOSIS — Z7901 Long term (current) use of anticoagulants: Secondary | ICD-10-CM | POA: Insufficient documentation

## 2021-05-02 DIAGNOSIS — I4892 Unspecified atrial flutter: Secondary | ICD-10-CM | POA: Diagnosis not present

## 2021-05-02 DIAGNOSIS — G4733 Obstructive sleep apnea (adult) (pediatric): Secondary | ICD-10-CM | POA: Diagnosis not present

## 2021-05-02 DIAGNOSIS — Z79899 Other long term (current) drug therapy: Secondary | ICD-10-CM | POA: Diagnosis not present

## 2021-05-02 DIAGNOSIS — E875 Hyperkalemia: Secondary | ICD-10-CM | POA: Diagnosis not present

## 2021-05-02 DIAGNOSIS — Z7952 Long term (current) use of systemic steroids: Secondary | ICD-10-CM | POA: Diagnosis not present

## 2021-05-02 DIAGNOSIS — I5020 Unspecified systolic (congestive) heart failure: Secondary | ICD-10-CM

## 2021-05-02 DIAGNOSIS — Z7984 Long term (current) use of oral hypoglycemic drugs: Secondary | ICD-10-CM | POA: Diagnosis not present

## 2021-05-02 DIAGNOSIS — Z7902 Long term (current) use of antithrombotics/antiplatelets: Secondary | ICD-10-CM | POA: Diagnosis not present

## 2021-05-02 DIAGNOSIS — I480 Paroxysmal atrial fibrillation: Secondary | ICD-10-CM | POA: Insufficient documentation

## 2021-05-02 LAB — BASIC METABOLIC PANEL
Anion gap: 5 (ref 5–15)
BUN: 13 mg/dL (ref 6–20)
CO2: 27 mmol/L (ref 22–32)
Calcium: 9.1 mg/dL (ref 8.9–10.3)
Chloride: 106 mmol/L (ref 98–111)
Creatinine, Ser: 1.7 mg/dL — ABNORMAL HIGH (ref 0.61–1.24)
GFR, Estimated: 47 mL/min — ABNORMAL LOW (ref >60)
Glucose, Bld: 132 mg/dL — ABNORMAL HIGH (ref 70–99)
Potassium: 4.8 mmol/L (ref 3.5–5.1)
Sodium: 138 mmol/L (ref 135–145)

## 2021-05-02 LAB — BRAIN NATRIURETIC PEPTIDE: B Natriuretic Peptide: 1152.1 pg/mL — ABNORMAL HIGH (ref 0.0–100.0)

## 2021-05-02 MED ORDER — TORSEMIDE 20 MG PO TABS: 20.0000 mg | ORAL_TABLET | ORAL | 3 refills | Status: AC

## 2021-05-02 NOTE — Patient Instructions (Addendum)
Medication Changes:  Stop Colchine  Take Torsemide Every other Day  Lab Work:  Labs done today, your results will be available in MyChart, we will contact you for abnormal readings.   Testing/Procedures:  Your physician has requested that you have an echocardiogram. Echocardiography is a painless test that uses sound waves to create images of your heart. It provides your doctor with information about the size and shape of your heart and how well your hearts chambers and valves are working. This procedure takes approximately one hour. There are no restrictions for this procedure.   Referrals:  none  Special Instructions // Education:  none  Follow-Up in:  4 Months (April 2023) with Echocardiogram ** Call in March for Appointment**   At the [facility], you and your health needs are our priority. We have a designated team specialized in the treatment of Heart Failure. This Care Team includes your primary Heart Failure Specialized Cardiologist (physician), Advanced Practice Providers (APPs- Physician Assistants and Nurse Practitioners), and Pharmacist who all work together to provide you with the care you need, when you need it.   You may see any of the following providers on your designated Care Team at your next follow up:  Dr [name] Dr Haynes Kerns, NP Lyda Jester, [place] [facility], [place] Audry Riles, PharmD   Please be sure to bring in all your medications bottles to every appointment.   Need to Contact us:  If you have any questions or concerns before your next appointment please send [place] a message through [place] or call our office at [phone].    TO LEAVE A MESSAGE FOR THE NURSE SELECT OPTION 2, PLEASE LEAVE A MESSAGE INCLUDING: YOUR NAME DATE OF BIRTH CALL BACK NUMBER REASON FOR CALL**this is important as we prioritize the call backs  YOU WILL RECEIVE A CALL BACK THE SAME DAY AS LONG AS YOU CALL  BEFORE 4:00 PM

## 2021-05-02 NOTE — Progress Notes (Signed)
ADV[facility]ED [facility]IN[facility]  NOTE    Primary Care: [name], MD  Nephrology: [place] ([facility]) Primary Cardiologist: Johnsie Cancel HF Cardiologist: Dr. Haroldine Laws   HPI: Roy Dyer is a 55 y.o. male with a severe premature CAD, SLE s/p renal transplant 2012 ([facility]), OSA on CPAP and systolic HF EF 57%   His first MI was in 2001 with stenting to the RCA and OM. Had BMS to LAD in 2012. Catheterization 9/18 showed 90% lesion in mRCA -> DES, LAD and OM stents patent.   Echo12/17 EF 30%   Echo 4/21 EF 25- 30% with severe LAE with mild MR and moderate TR.    Cath 12/21 for worsening HF symptoms. Stable CAD. With frequent PVCs. No RHC  Zio 12/21: PVCs 13.4%  Echo 6/22 EF 30-35% Apex akinetic. G3 DD RV ok Personally reviewed  Follow up 8/22 low-dose spiro restarted and repeat Zio placed to follow PVC burden. Zio showed 11.8% PVCs of one morphology.  Admit 9/22 for new atrial flutter and acute pericarditis, converted to NSR w/ amio and treated w/ colchicine. Echo no effusion, EF 30-35%, RV normal.   Seen in ED 10/22 w/ recurrent, symptomatic atrial flutter w/ RVR HR 150s. He converted to SR with IV amio. EP consulted and discussed possibility of AFL ablation after GDMT optimized and he is off colchicine; weight 203 lbs.   Today he returns for HF follow up. Feeling pretty good. Doing most activities without too much problem. Saw Dr. Curt Bears last month for possible AF/AFL ablation. Dr. Curt Bears wanted to defer due to LA size. Cut amio to 100 daily due to GI side effects which have now imprved. Seen at [facility] 04/13/21 and Scr up to 2. Taking torsemide every 2-3 days. SBP 125-144  ROS:  Please see the history of present illness.   All other systems are personally reviewed and negative.   Past Medical History:  Diagnosis Date   AICD (automatic cardioverter/defibrillator) present    Dr. Paschal Dopp follows remotely-yrly checks, Dr. Jonne Ply   Atrial flutter ([place]) 01/2021   dx'd when in  [place] for pericarditis->amiodarone+eliquis   Avascular necrosis of bone of hip ([place]) 2010 surg   Left hip arthroplasty: from chronic systemic steroids taken for his Lupus   Barrett's esophagus 2017; 2021   Blood transfusion without reported diagnosis 2010, 2012   CAD (coronary artery disease)    **DAPT long term recommended by cards**.  a. stents RCA/Circ 2001 b. BMS to LAD 07/2010  c. 01/2017: s/p DES to RCA.    Cataract    left eye   CHF (congestive heart failure) (HCC)    Chronic headaches 02/2017   As of 01/2017, HA's no better, neurologist ordered CT.  ESR normal at that time.  HA'S COMPLETELY RESOLVED AFTER HE GOT ON CPAP 2019/2020.   Chronic renal insufficiency, stage 3 (moderate) (HCC)    GFR 55-60 (sCr 1.4-1.7 range)   Erythrocytosis    Pre and post transplant->on losartan for this   GERD (gastroesophageal reflux disease)    Hx of esoph stricture and dilatation.  +Barrett's esophagus+hx of aspiration pneumonia-->to get rpt EGD when he comes off DAPT (utd as of 01/29/18)   Gout    s/p renal transplant he was weened off of his allopurinol.   Hiatal hernia    History of adenomatous polyp of colon 09/2019   polyp x 1. Recall 5 yrs   History of end stage renal disease 04/24/2011   Secondary to SLE: HD 06/2011-10/2012 (  then got renal transplant)   History of renal transplant 11/10/2012   10/2012 [facility]    Hyperlipidemia    Hypertension    implantable cardiac defibrillator single chamber    [employer]tr[employer] (due to low EF)   Ischemic cardiomyopathy    Chronic systolic dysfunction--  17/4944 EF 30-35%.   Single chamber ICD 11/20/10 ([name])   Left ventricular thrombus 2012   Re-eval 02/2011 showed thrombus RESOLVED, so coumadin was d/c'd (was on it for 40mo   Lupus ([place]    OSA on CPAP 2018   Dr. [name]9/2018: +OSA on home sleep studay; CPAP auto titrate 5-20 started 05/2017.   Osteoporosis    Pericarditis associated with systemic lupus erythematosus ([place] 01/2021   hosp->colchicine    Prediabetes 12/2019   A1c stable long term at 5.6-5.7 until jump up to 6.2% Aug 2021.    Current Meds  Medication Sig   allopurinol (ZYLOPRIM) 100 MG tablet Take 1 tablet (100 mg total) by mouth daily.   amiodarone (PACERONE) 200 MG tablet Take 0.5 tablets (100 mg total) by mouth daily.   apixaban (ELIQUIS) 5 MG TABS tablet Take 1 tablet (5 mg total) by mouth 2 (two) times daily.   atorvastatin (LIPITOR) 80 MG tablet Take 80 mg by mouth at bedtime.   Ca Carbonate-Mag Hydroxide (ROLAIDS PO) Take 1 tablet by mouth daily as needed (heartburn).   clopidogrel (PLAVIX) 75 MG tablet TAKE 1 TABLET BY MOUTH DAILY   colchicine 0.6 MG tablet Take 1 tablet (0.6 mg total) by mouth daily.   dexlansoprazole (DEXILANT) 60 MG capsule Take 1 capsule (60 mg total) by mouth daily.   famotidine (PEPCID) 20 MG tablet Take 40 mg by mouth at bedtime.    FARXIGA 10 MG TABS tablet TAKE 1 TABLET DAILY BEFORE BREAKFAST   fluticasone (FLONASE) 50 MCG/ACT nasal spray Place 1 spray into both nostrils daily as needed for allergies.   magnesium oxide (MAG-OX) 400 MG tablet Take 400 mg by mouth daily.    melatonin 3 MG TABS tablet Take 1 tablet (3 mg total) by mouth at bedtime as needed.   mexiletine (MEXITIL) 150 MG capsule Take 2 capsules (300 mg total) by mouth 2 (two) times daily.   mycophenolate (CELLCEPT) 250 MG capsule Take 3 capsules (750 mg total) by mouth 2 (two) times daily.   nebivolol (BYSTOLIC) 5 MG tablet Take 2.5 mg by mouth as needed (increased heart rate).   nitroGLYCERIN (NITROSTAT) 0.4 MG SL tablet Place 1 tablet (0.4 mg total) under the tongue every 5 (five) minutes as needed for chest pain (DO NOT EXCEED 3 DOSES).   potassium chloride SA (KLOR-CON) 20 MEQ tablet Take 1 tablet (20 mEq total) by mouth as needed (for heart failure symptoms per Dr. BJeffie Pollock.   predniSONE (DELTASONE) 5 MG tablet TAKE 1 TABLET BY MOUTH DAILY   tacrolimus (PROGRAF) 0.5 MG capsule Take 0.5 mg by mouth every 12 (twelve) hours.     torsemide (DEMADEX) 20 MG tablet Take 1 tablet (20 mg total) by mouth as needed (for heart failure symptoms per Dr. BJeffie Pollock.   traMADol (ULTRAM) 50 MG tablet Take 50 mg by mouth every 6 (six) hours as needed. for pain         Allergies  Allergen Reactions   Triptans Palpitations      Reaction to Max[place]      Social History         Socioeconomic History   Marital status: Married  name: Not on file  ° Number of children: 2  ° Years of education: Not on file  ° Highest education level: Not on file  °Occupational History  ° Occupation: CAD Drafter   °    Employer: [employer]  °Tobacco Use  ° Smoking status: Former Smoker  °    Packs/day: 0.50  °    Years: 30.00  °    Pack years: 15.00  °    Types: Cigarettes  °    Quit date: 08/02/2010  °    Years since quitting: 9.7  ° Smokeless tobacco: Never Used  °Vaping Use  ° Vaping Use: Never used  °Substance and Sexual Activity  ° Alcohol use: No  °    Alcohol/week: 0.0 standard drinks  ° Drug use: No  ° Sexual activity: Yes  °    Partners: Female  °Other Topics Concern  ° Not on file  °Social History Narrative  °  Married, 1 teenage son and 1 teenage daughter.   °  Occupation: electronic drafter.  °  15 pack-yr smoking hx, quit 07/2010.  °  Drug Use - no  °  No alcohol.  °     °     °     °  °Social Determinants of Health  °  °Financial Resource Strain: Not on file  °Food Insecurity: Not on file  °Transportation Needs: Not on file  °Physical Activity: Not on file  °Stress: Not on file  °Social Connections: Not on file  °Intimate Partner Violence: Not on file  °  °  °     °Family History  °Problem Relation Age of Onset  ° Kidney failure Mother    ° Lupus Mother    ° Stroke Mother    ° Diabetes Mother    °      type 2  ° Heart attack Father    °      X 7  ° Hypertension Father    ° Hyperlipidemia Father    ° Heart attack Sister 47  °      X 1  ° Hyperlipidemia Sister    ° Hypertension Sister    ° Heart disease Sister    ° Heart attack Paternal  Grandfather    ° Heart disease Paternal Aunt    ° Heart disease Paternal Uncle    ° Colon cancer Neg Hx    ° Esophageal cancer Neg Hx    ° Stomach cancer Neg Hx    ° Rectal cancer Neg Hx    °  °BP 140/80    Pulse 65    Wt 88.1 kg (194 lb 3.2 oz)    SpO2 98%    BMI 30.42 kg/m²  ° °Wt Readings from Last 3 Encounters:  °05/02/21 88.1 kg (194 lb 3.2 oz)  °03/28/21 88 kg (194 lb)  °03/06/21 89.2 kg (196 lb 9.6 oz)  ° °PHYSICAL EXAM: °General:  Well appearing. No resp difficulty °HEENT: normal °Neck: supple. JVP 7 Carotids 2+ bilat; no bruits. No lymphadenopathy or thryomegaly appreciated. °Cor: PMI nondisplaced. Regular rate & rhythm. No rubs, gallops or murmurs. °Lungs: clear °Abdomen: soft, nontender, nondistended. No hepatosplenomegaly. No bruits or masses. Good bowel sounds. °Extremities: no cyanosis, clubbing, rash, trace edema °Neuro: alert & orientedx3, cranial nerves grossly intact. moves all 4 extremities w/o difficulty. Affect pleasant ° ° °ICD interrogation: Optivol high. No VT/AF. Activity 2-3hr/day (personally reviewed). ° °ECG: SR 70 1AVB 240 LVH (personally reviewed) ° °ASSESSMENT &   PLAN:   1. Paroxysmal Atrial Fib/Flutter - Admit for new AFL 9/22, converted on amio gtt - Back in NSR on amio.  Seen by Dr. Curt Bears 11/22 for possible AF/AFL ablation. Dr. Curt Bears wanted to defer due to LA size. Cut amio to 100 daily due to GI side effects which have now improved. - Continue bisoprolol 2.5 mg daily. - Continue Eliquis 5 mg bid. No bleeding issues - Decrease amiodarone to 200 mg daily. PRN Zofran for nausea.   2. Chronic Systolic Heart Failure  - HX ICD in 2012 - Echo 12/17 EF 30%  - Cath 04/22/20 for worsening HF symptoms. Stable CAD. - Echo 6/22 EF 30-35%, Grade III DD, severe LAE, mild/mod TR - Echo 02/04/21: EF 30-35% No effusion, RV normal. - NYHA II Volume status mildly elevated on exam and Optivol - Take torsemide 10 mg every other day + PRN - Continue bisoprolol 2.5 mg daily. - Continue  Farxiga 10 mg daily. - Continue to hold Entresto with recent AKI.  Plan to add back as able  being careful with transplanted kidney - We have not been able to try spiro due to hyperkalemia - Labs today   3. Pericarditis  - Diagnosed previous admit 9/22. Echo showed no effusion.  - No CP. - Can stop colchicine   4. CKD 3a s/p renal transplant in 2012 (due to SLE): - Follows with nephrology, Dr. Clover Mealy and Dr. Lissa Merlin at [facility]. - Baseline Creatinine 1.4-1.7 - Recent Scr 2.0 at [facility] 04/13/21 - Will continue to hold Entresto. Continue Farxiga - Continue prednisone, Prograf and CellCept. Prograf recently changed to new formulation. - Labs today.   5.  CAD with prior MI x2 and ischemic cardiomyopathy - Multiple stents to LAD, RCA and LCX - Cath 04/22/20 for worsening HF symptoms with stable disease - No ASA given Eliquis  - Continue Plavix + statin. - No s/s ischemia   6. PVCs - Zio 12/21 13.4% PVC burden on Zio with runs of SVT & NSVT. - PVCs reduced on mexilitene 300 mg bid. No PVCs on ECG today - Zio (5/22): PVC 11.6 % burden - Zio (8/22): PVC 11.8% burden - Better suppressed w/ amio and mexiletine. - Continue CPAP QHS. - Continue bisoprolol.  - Keep K > 4.0 and Mg > 2.0  - Labs today. - Check echo in 4 months    [name] MD 05/02/21

## 2021-05-14 DIAGNOSIS — I447 Left bundle-branch block, unspecified: Secondary | ICD-10-CM

## 2021-05-14 HISTORY — DX: Left bundle-branch block, unspecified: I44.7

## 2021-05-17 ENCOUNTER — Other Ambulatory Visit: Payer: Self-pay

## 2021-05-17 MED ORDER — APIXABAN 5 MG PO TABS: 5.0000 mg | ORAL_TABLET | Freq: Two times a day (BID) | ORAL | 3 refills | Status: AC

## 2021-06-01 ENCOUNTER — Ambulatory Visit (INDEPENDENT_AMBULATORY_CARE_PROVIDER_SITE_OTHER): Payer: Managed Care, Other (non HMO)

## 2021-06-01 DIAGNOSIS — I255 Ischemic cardiomyopathy: Secondary | ICD-10-CM

## 2021-06-01 LAB — CUP PACEART REMOTE DEVICE CHECK
Battery Remaining Longevity: 111 mo
Battery Voltage: 3 V
Brady Statistic RV Percent Paced: 0.47 %
Date Time Interrogation Session: 20230119022824
HighPow Impedance: 45 Ohm
HighPow Impedance: 52 Ohm
Implantable Lead Implant Date: 20120709
Implantable Lead Location: 753860
Implantable Lead Model: 6947
Implantable Pulse Generator Implant Date: 20191129
Lead Channel Impedance Value: 247 Ohm
Lead Channel Impedance Value: 342 Ohm
Lead Channel Pacing Threshold Amplitude: 1.25 V
Lead Channel Pacing Threshold Pulse Width: 0.4 ms
Lead Channel Sensing Intrinsic Amplitude: 15.125 mV
Lead Channel Sensing Intrinsic Amplitude: 15.125 mV
Lead Channel Setting Pacing Amplitude: 2.5 V
Lead Channel Setting Pacing Pulse Width: 0.5 ms
Lead Channel Setting Sensing Sensitivity: 0.3 mV

## 2021-06-05 ENCOUNTER — Telehealth (HOSPITAL_COMMUNITY): Payer: Self-pay | Admitting: *Deleted

## 2021-06-05 NOTE — Telephone Encounter (Signed)
Spoke with pt he said he has kidney issues and was only taking colchicine 0.6 daily. Pt scheduled for ekg tomorrow.

## 2021-06-05 NOTE — Telephone Encounter (Signed)
Pt left vm stating he is having shortness of breath and the same symptoms he had when had pericarditis. Pt said tylenol is not taking the pain away he asked if he needs to be back on colchicine or any advice on what he needs to do.   Routed to [place] for advice

## 2021-06-06 ENCOUNTER — Other Ambulatory Visit: Payer: Self-pay

## 2021-06-06 ENCOUNTER — Ambulatory Visit (HOSPITAL_COMMUNITY)
Admission: RE | Admit: 2021-06-06 | Discharge: 2021-06-06 | Disposition: A | Discharge status: Home / Self Care | Payer: Managed Care, Other (non HMO) | Source: Ambulatory Visit | Attending: Cardiology | Admitting: Cardiology

## 2021-06-06 DIAGNOSIS — R0600 Dyspnea, unspecified: Secondary | ICD-10-CM | POA: Diagnosis present

## 2021-06-14 NOTE — Progress Notes (Signed)
Remote ICD transmission.   

## 2021-07-06 ENCOUNTER — Other Ambulatory Visit: Payer: Self-pay

## 2021-07-07 ENCOUNTER — Encounter: Payer: Self-pay | Admitting: Family Medicine

## 2021-07-07 ENCOUNTER — Ambulatory Visit: Payer: Managed Care, Other (non HMO) | Admitting: Family Medicine

## 2021-07-07 VITALS — BP 125/68 | HR 62 | Temp 97.0°F | Ht 67.0 in | Wt 189.8 lb

## 2021-07-07 DIAGNOSIS — I255 Ischemic cardiomyopathy: Secondary | ICD-10-CM

## 2021-07-07 DIAGNOSIS — Z940 Kidney transplant status: Secondary | ICD-10-CM

## 2021-07-07 DIAGNOSIS — D849 Immunodeficiency, unspecified: Secondary | ICD-10-CM

## 2021-07-07 DIAGNOSIS — E7800 Pure hypercholesterolemia, unspecified: Secondary | ICD-10-CM

## 2021-07-07 DIAGNOSIS — R7303 Prediabetes: Secondary | ICD-10-CM | POA: Diagnosis not present

## 2021-07-07 DIAGNOSIS — F411 Generalized anxiety disorder: Secondary | ICD-10-CM | POA: Diagnosis not present

## 2021-07-07 DIAGNOSIS — Z23 Encounter for immunization: Secondary | ICD-10-CM

## 2021-07-07 DIAGNOSIS — N1831 Chronic kidney disease, stage 3a: Secondary | ICD-10-CM | POA: Diagnosis not present

## 2021-07-07 DIAGNOSIS — I4891 Unspecified atrial fibrillation: Secondary | ICD-10-CM

## 2021-07-07 DIAGNOSIS — I10 Essential (primary) hypertension: Secondary | ICD-10-CM

## 2021-07-07 LAB — COMPREHENSIVE METABOLIC PANEL
ALT: 9 U/L (ref 0–53)
AST: 12 U/L (ref 0–37)
Albumin: 4 g/dL (ref 3.5–5.2)
Alkaline Phosphatase: 113 U/L (ref 39–117)
BUN: 17 mg/dL (ref 6–23)
CO2: 36 mEq/L — ABNORMAL HIGH (ref 19–32)
Calcium: 9.7 mg/dL (ref 8.4–10.5)
Chloride: 101 mEq/L (ref 96–112)
Creatinine, Ser: 1.68 mg/dL — ABNORMAL HIGH (ref 0.40–1.50)
GFR: 45.85 mL/min — ABNORMAL LOW (ref >60.00)
Glucose, Bld: 101 mg/dL — ABNORMAL HIGH (ref 70–99)
Potassium: 4.4 mEq/L (ref 3.5–5.1)
Sodium: 141 mEq/L (ref 135–145)
Total Bilirubin: 1.1 mg/dL (ref 0.2–1.2)
Total Protein: 5.9 g/dL — ABNORMAL LOW (ref 6.0–8.3)

## 2021-07-07 LAB — LIPID PANEL
Cholesterol: 129 mg/dL (ref 0–200)
HDL: 51.7 mg/dL (ref >39.00)
LDL Cholesterol: 62 mg/dL (ref 0–99)
NonHDL: 77.22
Total CHOL/HDL Ratio: 2
Triglycerides: 75 mg/dL (ref 0.0–149.0)
VLDL: 15 mg/dL (ref 0.0–40.0)

## 2021-07-07 LAB — HEMOGLOBIN A1C: Hgb A1c MFr Bld: 6.3 % (ref 4.6–6.5)

## 2021-07-07 MED ORDER — PAROXETINE HCL 20 MG PO TABS: 20.0000 mg | ORAL_TABLET | Freq: Every day | ORAL | 1 refills | Status: DC

## 2021-07-07 MED ORDER — COLCHICINE 0.6 MG PO TABS: 0.6000 mg | ORAL_TABLET | Freq: Two times a day (BID) | ORAL | 3 refills | Status: AC

## 2021-07-07 NOTE — Progress Notes (Signed)
OFFICE VISIT  07/07/2021  CC:  Chief Complaint  Patient presents with   Hypertension    RCI; fasting    HPI:    Patient is a 56 y.o. male who presents for f/u prediabetes, HTN, CRI, and has worsening anxiety. I last saw him for hosp f/u about 4 mo ago. A/P as of that visit: "1) A. fib/flutter, recent hospitalization for rapid ventricular response. Converted to sinus rhythm on amiodarone and has continued this at 200 twice daily. He has Bystolic to take as needed increased heart rate.  He is taking Eliquis 5 mg twice a day. He is in discussions with the electrophysiologist about next step. Checking electrolytes and kidney function today.   #2 ischemic cardiomyopathy, EF 30 to 35%. He continues on maximal tolerated medical therapy under the direction of Dr. Haroldine Laws. He has torsemide 20 mg to take daily as needed fluid retention.  He is working on trying to get dosing appropriate to help fluid retention but not cause acute kidney injury.  He may try to take a 10 mg dose daily to see if it helps any better than as needed dosing.   #3: History of renal transplant, on tacrolimus, mycophenolate, and daily prednisone. Current tacrolimus dose is 0.5m bid (he hasn't taken it yet today)--will check tacrolimus level today due to the potential for this being altered by amiodarone.   #4: Chronic renal insufficiency stage III.  Creatinine range typically 1.4-1.7 for him.  Most recent creatinine on 02/15/2021 was 1.88. BMET today.  #5  Pericarditis.  Question due to his lupus.  He does say he took the flu vaccine a few days prior to developing his symptoms so it is hard to tell if this was related. Symptoms have resolved and he continues on colchicine 0.6 mg daily."  INTERIM HX: TSenaidescribes a long history of excessive worry, gradually worsening over the last year or 2. Wife encouraged him to talk about today because he seems to be getting more more irritable at home.  He has some impaired  concentration, feels keyed up, chronic poor energy level, does not sleep well.  Denies depressed mood.  Has never been on anxiety or depression medication.  Home blood pressures in the 1737Tto 1062systolic range. He was having some mild tacrolimus elevation so that he was switched to the extended release form by his transplant team. Last dose was about 24 hours ago--checking level today.  He says his last level was around 5 and he not told to change dose any at that time.  His colchicine was stopped in December 2022 but he had a recurrence of left-sided chest discomfort, went to his cardiologist, was felt to have recurrence of his pericarditis and was restarted on colchicine and all symptoms resolved.  He will remain on this indefinitely.   Electrophysiology follow-up since I last saw him--they decided to hold off on DCCV, kept him on amiodarone, bisoprolol, and Eliquis. Heart failure regimen continues to be optimized by CHF clinic: Torsemide 10 mg every other day and as needed, bisoprolol, Farxiga.  Entresto held due to acute kidney injury.  Spironolactone held due to hyperkalemia.   Past Medical History:  Diagnosis Date   AICD (automatic cardioverter/defibrillator) present    [name] remotely-yrly checks, [name]  [place]   [place]   [place]   Left hip arthroplasty: from chronic systemic steroids taken for his  Lupus   Barrett's esophagus 2017; 2021   Blood transfusion without reported diagnosis 2010, 2012   CAD (coronary artery disease)    **DAPT long term recommended by cards**.  a. stents RCA/Circ 2001 b. BMS to LAD 07/2010  c. 01/2017: s/p DES to RCA.    Cataract    left eye   CHF (congestive heart failure) (HCC)    Chronic headaches 02/2017   As of 01/2017, HA's no better, neurologist ordered CT.  ESR normal at that time.  HA'S COMPLETELY RESOLVED  AFTER HE GOT ON CPAP 2019/2020.   Chronic renal insufficiency, stage 3 (moderate) (HCC)    GFR 55-60 (sCr 1.4-1.7 range)   Erythrocytosis    Pre and post transplant->on losartan for this   GERD (gastroesophageal reflux disease)    Hx of esoph stricture and dilatation.  +Barrett's esophagus+hx of aspiration pneumonia-->to get rpt EGD when he comes off DAPT (utd as of 01/29/18)   Gout    s/p renal transplant he was weened off of his allopurinol.   Hiatal hernia    History of adenomatous polyp of colon 09/2019   polyp x 1. 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   Single chamber ICD 11/20/10 ([name])   Left ventricular thrombus 2012   Re-eval 02/2011 showed thrombus RESOLVED, so coumadin was d/c'd (was on it for 7mo   [place]    OSA on CPAP 2018   Dr. [name]9/2018: +OSA on home sleep studay; CPAP auto titrate 5-20 started 05/2017.   Osteoporosis    Pericarditis associated with systemic lupus erythematosus [place] 01/[place]   hosp->colchicine   Prediabetes 12/2019   A1c stable long term at 5.6-5.7 until jump up to 6.2% Aug 2021.    Past Surgical History:  Procedure Laterality Date   AV FISTULA PLACEMENT  03/28/2011   Procedure: ARTERIOVENOUS (AV) FISTULA CREATION;  Surgeon: TRosetta Posner MD;  [facility]  Service: Vascular;  Laterality: Left;  Creation of Left radiocephallic cimino fistula   AV FISTULA PLACEMENT  04/27/2011   Procedure: ARTERIOVENOUS (AV) FISTULA CREATION;  Surgeon: TRosetta Posner MD;  [facility] [facility]  Service: Vascular;  Laterality: Left;  left basilic vein transposition   BIOPSY  09/14/2019   Procedure: BIOPSY;  Surgeon:  PJerene Bears MD;  [facility];  Service: Gastroenterology;;   CARDIAC CATHETERIZATION  08/2010; 04/22/20   08/2010; 04/2020->mild/mod in-stent restenosis but none hemodynamically signif, severe LV dysfxn, frequenct PVCs-->referred to HF clinic for optimization of med mgmt   CARDIOVASCULAR STRESS TEST  07/2010; 03/2014   03/2014 showed large old infarct and EF 30-35% but no ischemia   COLONOSCOPY  09/14/2019   Adenoma x 1.  Recall 5 yrs   COLONOSCOPY WITH PROPOFOL N/A 09/14/2019   Procedure: COLONOSCOPY WITH PROPOFOL;  Surgeon: PJerene Bears MD;  [facility];  Service: Gastroenterology;  Laterality: N/A;   CORONARY PRESSURE WIRE/FFR WITH 3D MAPPING N/A 04/22/2020   Procedure: Coronary Pressure Wire/FFR w/3D Mapping;  Surgeon: AWellington Hampshire MD;  [facility];  Service: Cardiovascular;  Laterality: N/A;   DEXA  07/05/2017   Normal bone density in radius and spine.   ESOPHAGOGASTRODUODENOSCOPY  02/2009   Done by Dr. Fuller Plan for hematemesis; esophagitis found.  Repeat recommended 09/2016, at which time he will also likely get his initial screening colonoscopy.   ESOPHAGOGASTRODUODENOSCOPY  09/14/2019   esophagitis (improved compared to 2017 EGD), Barrett's.  Stomach and duodenum normal.   ESOPHAGOGASTRODUODENOSCOPY (EGD) WITH PROPOFOL N/A 09/29/2015   REPEAT EGD RECOMMENDED 09/2016.  Barrett's esoph + mild chronic gastritis.  H pylori NEG. Procedure: ESOPHAGOGASTRODUODENOSCOPY (EGD) WITH PROPOFOL;  Surgeon: Jerene Bears, MD;  [facility];  Service: Gastroenterology;  Laterality: N/A;   ESOPHAGOGASTRODUODENOSCOPY (EGD) WITH PROPOFOL N/A 09/14/2019   Procedure: ESOPHAGOGASTRODUODENOSCOPY (EGD) WITH PROPOFOL;  Surgeon: Jerene Bears, MD;  [facility];  Service: Gastroenterology;  Laterality: N/A;   ICD GENERATOR CHANGEOUT N/A 04/11/2018   Procedure: ICD GENERATOR CHANGEOUT;  Surgeon: Deboraha Sprang, MD;  Location: [facility] [facility];   Service: Cardiovascular;  Laterality: N/A;   INSERT / REPLACE / REMOVE PACEMAKER     medtronic        dr [name]    [place]   icd only   KIDNEY TRANSPLANT  10/2012   [facility] nephrologist--Dr. Blair Heys   LEFT HEART CATH AND CORONARY ANGIOGRAPHY N/A 02/08/2017   PCA and DES to mRCA--needs DAPT for at least 6 mo.   Procedure: LEFT HEART CATH AND CORONARY ANGIOGRAPHY;  Surgeon: Sherren Mocha, MD;  Location: [facility];  Service: Cardiovascular;  Laterality: N/A;   LEFT HEART CATH AND CORONARY ANGIOGRAPHY N/A 04/22/2020   Procedure: LEFT HEART CATH AND CORONARY ANGIOGRAPHY;  Surgeon: Wellington Hampshire, MD;  Location: [place] [facility];  Service: Cardiovascular;  Laterality: N/A;   PARTIAL HIP ARTHROPLASTY Left 2010   left   POLYPECTOMY  09/14/2019   Procedure: POLYPECTOMY;  Surgeon: Jerene Bears, MD;  [facility];  Service: Gastroenterology;;   RENAL BIOPSY     stents     05-2010 and 2- in 2010 and 1 in 2018.   TOTAL HIP ARTHROPLASTY  07/09/2011   Procedure: TOTAL HIP ARTHROPLASTY;  Surgeon: Kerin Salen, MD;  Location: [facility];  Service: Orthopedics;  Laterality: Right;   TRANSTHORACIC ECHOCARDIOGRAM  10/13/2020   EF 30-35% (slight improved), grd III DD, elev PA pressure, mold/mod tricusp regurg   TYMPANOSTOMY TUBE PLACEMENT  56 yrs old   US ECHOCARDIOGRAPHY  12/2010; 02/2014;08/2014;08/2019   02/2014 EF still 25-30%, increased PA pressures.  2016 EF 30-35%, sept/inf hypokinesis, mod tricusp regurg. 08/2019 EF 25-30%, global hypok, sev dil LV w/apical aneur, grd III DD, mod tricus reg, mod pulm htn   ZIO PATCH  04/2020   HIGH PVC BURDEN.  NSVT, SVT, BBB.    Outpatient Medications Prior to Visit  Medication Sig Dispense Refill   allopurinol (ZYLOPRIM) 100 MG tablet Take 1 tablet (100 mg total) by mouth daily. 90 tablet 3   amiodarone (PACERONE) 200 MG tablet Take 0.5 tablets (100 mg total) by mouth daily. 45 tablet 3   apixaban (ELIQUIS) 5 MG TABS tablet Take 1 tablet  (5 mg total) by mouth 2 (two) times daily. 180 tablet 3   atorvastatin (LIPITOR) 80 MG tablet Take 80 mg by mouth at bedtime.     Ca Carbonate-Mag Hydroxide (ROLAIDS PO) Take 1 tablet by mouth daily as needed (heartburn).     clopidogrel (PLAVIX) 75 MG tablet TAKE 1 TABLET BY MOUTH DAILY 90 tablet 3   dexlansoprazole (DEXILANT) 60  MG capsule Take 1 capsule (60 mg total) by mouth daily. 90 capsule 3   ENVARSUS XR 0.75 MG TB24 tablet Take 0.75 mg by mouth daily.     famotidine (PEPCID) 20 MG tablet Take 40 mg by mouth at bedtime.      FARXIGA 10 MG TABS tablet TAKE 1 TABLET DAILY BEFORE BREAKFAST 90 tablet 3   fluticasone (FLONASE) 50 MCG/ACT nasal spray Place 1 spray into both nostrils daily as needed for allergies.     magnesium oxide (MAG-OX) 400 MG tablet Take 400 mg by mouth daily.      melatonin 3 MG TABS tablet Take 1 tablet (3 mg total) by mouth at bedtime as needed.  0   mexiletine (MEXITIL) 150 MG capsule Take 2 capsules (300 mg total) by mouth 2 (two) times daily. 360 capsule 3   mycophenolate (CELLCEPT) 250 MG capsule Take 3 capsules (750 mg total) by mouth 2 (two) times daily. 540 capsule 1   nebivolol (BYSTOLIC) 5 MG tablet Take 2.5 mg by mouth as needed (increased heart rate).     nitroGLYCERIN (NITROSTAT) 0.4 MG SL tablet Place 1 tablet (0.4 mg total) under the tongue every 5 (five) minutes as needed for chest pain (DO NOT EXCEED 3 DOSES). 25 tablet 3   potassium chloride SA (KLOR-CON) 20 MEQ tablet Take 1 tablet (20 mEq total) by mouth as needed (for heart failure symptoms per Dr. Jeffie Pollock). 90 tablet 3   predniSONE (DELTASONE) 5 MG tablet TAKE 1 TABLET BY MOUTH DAILY 90 tablet 3   torsemide (DEMADEX) 20 MG tablet Take 1 tablet (20 mg total) by mouth every other day. 90 tablet 3   traMADol (ULTRAM) 50 MG tablet Take 50 mg by mouth every 6 (six) hours as needed. for pain  0   tacrolimus (PROGRAF) 0.5 MG capsule Take 0.5 mg by mouth every 12 (twelve) hours.  (Patient not taking:  Reported on 07/07/2021)     No facility-administered medications prior to visit.    Allergies  Allergen Reactions   Triptans Palpitations    Reaction to Maxalt    ROS As per HPI  PE: Vitals with BMI 07/07/2021 07/07/2021 05/02/2021  Height - 5' 7"  -  Weight - 189 lbs 13 oz 194 lbs 3 oz  BMI - 20.25 -  Systolic 427 062 376  Diastolic 68 74 80  Pulse - 62 65  02 sat 97% on RA today Rpt bp manual today 125/68  Physical Exam  Gen: Alert, well appearing.  Patient is oriented to person, place, time, and situation. AFFECT: pleasant, lucid thought and speech. CV: RRR, 2/6 systolic murmur, no diastolic murmur, no r/g.   LUNGS: CTA bilat, nonlabored resps, good aeration in all lung fields. EXT: no clubbing or cyanosis.  no edema.    LABS:  Last CBC Lab Results  Component Value Date   WBC 13.2 04/13/2021   HGB 14.6 04/13/2021   HCT 48 04/13/2021   MCV 92.2 03/06/2021   MCH 28.5 03/06/2021   RDW 14.6 03/06/2021   PLT 234 28/31/5176   Last metabolic panel Lab Results  Component Value Date   GLUCOSE 132 (H) 05/02/2021   NA 138 05/02/2021   K 4.8 05/02/2021   CL 106 05/02/2021   CO2 27 05/02/2021   BUN 13 05/02/2021   CREATININE 1.70 (H) 05/02/2021   GFRNONAA 47 (L) 05/02/2021   CALCIUM 9.1 05/02/2021   PHOS 3.7 02/08/2021   PROT 5.6 (L) 02/03/2021   ALBUMIN 3.0 (L)  02/08/2021   BILITOT 0.7 02/03/2021   ALKPHOS 92 02/03/2021   AST 17 02/03/2021   ALT 14 02/03/2021   ANIONGAP 5 05/02/2021   Last lipids Lab Results  Component Value Date   CHOL 115 07/07/2020   HDL 63.50 07/07/2020   LDLCALC 34 07/07/2020   TRIG 83.0 07/07/2020   CHOLHDL 2 07/07/2020   Last hemoglobin A1c Lab Results  Component Value Date   HGBA1C 6.1 (H) 02/04/2021   Last thyroid functions Lab Results  Component Value Date   TSH 1.942 02/04/2021   Lab Results  Component Value Date   PSA 0.88 01/04/2021   PSA 1.22 01/06/2020   PSA 1.70 07/15/2018   IMPRESSION AND PLAN:  #1  GAD. Start paroxetine 20 mg a day.  Therapeutic expectations and side effect profile of medication discussed today.  Patient's questions answered.  #2 prediabetes.  He is on [place] for his CHF. Checking hemoglobin A1c and fasting glucose today.  3.  Hypertension.  Blood pressure is good on bisoprolol 5 mg/day. Electrolytes and creatinine checked today.  4.  Chronic renal insufficiency, history of renal transplant. He was changed to extended release tacrolimus---envarsus 0.75 mg qd. We will check tacrolimus trough today. Electrolytes and creatinine check as well.  #5 coronary artery disease, ischemic cardiomyopathy, A-fib, history of pericarditis. He is followed by specialist and is maintained on bisoprolol, Eliquis, amiodarone, mexiletine, Plavix, Farxiga, a atorvastatin, colchicine, and torsemide.  An After Visit Summary was printed and given to the patient.  FOLLOW UP: Return for 3-4 wks f/u anxiety. Next cpe 12/2021  Signed:  Crissie Sickles, MD           07/07/2021

## 2021-07-08 LAB — EXTRA SPECIMEN

## 2021-07-08 LAB — TACROLIMUS LEVEL: Tacrolimus (FK506), Blood: 5.6 ng/mL

## 2021-07-20 ENCOUNTER — Ambulatory Visit: Payer: Managed Care, Other (non HMO) | Admitting: Cardiology

## 2021-07-20 ENCOUNTER — Other Ambulatory Visit: Payer: Self-pay

## 2021-07-20 ENCOUNTER — Encounter: Payer: Self-pay | Admitting: Cardiology

## 2021-07-20 VITALS — BP 108/68 | HR 60 | Ht 67.0 in | Wt 190.0 lb

## 2021-07-20 DIAGNOSIS — I480 Paroxysmal atrial fibrillation: Secondary | ICD-10-CM

## 2021-07-20 DIAGNOSIS — I5022 Chronic systolic (congestive) heart failure: Secondary | ICD-10-CM

## 2021-07-20 DIAGNOSIS — D6869 Other thrombophilia: Secondary | ICD-10-CM

## 2021-07-20 NOTE — Patient Instructions (Signed)
?   Medication Instructions:  ?Your physician recommends that you continue on your current medications as directed. Please refer to the Current Medication list given to you today. ? ?*If you need a refill on your cardiac medications before your next appointment, please call your pharmacy* ? ? ?Lab Work: ?None ordered ? ? ?Testing/Procedures: ?Your physician has requested that you have an echocardiogram. Echocardiography is a painless test that uses sound waves to create images of your heart. It provides your doctor with information about the size and shape of your heart and how well your heart?s chambers and valves are working. This procedure takes approximately one hour. There are no restrictions for this procedure. ? ? ?Follow-Up: ?At [facility], you and your health needs are our priority.  As part of our continuing mission to provide you with exceptional heart care, we have created designated Provider Care Teams.  These Care Teams include your primary Cardiologist (physician) and Advanced Practice Providers (APPs -  Physician Assistants and Nurse Practitioners) who all work together to provide you with the care you need, when you need it. ? ?Remote monitoring is used to monitor your Pacemaker or ICD from home. This monitoring reduces the number of office visits required to check your device to one time per year. It allows [place] to keep an eye on the functioning of your device to ensure it is working properly. You are scheduled for a device check from home on 08/31/2021. You may send your transmission at any time that day. If you have a wireless device, the transmission will be sent automatically. After your physician reviews your transmission, you will receive a postcard with your next transmission date. ? ?Your next appointment:   ?To be determined  after echocardiogram  (if all looks good will follow up in 6 months) ? ?The format for your next appointment:   ?In Person ? ?Provider:   ?Allegra Lai, MD ? ? ?Thank  you for choosing [facility]!! ? ? ?Trinidad Curet, RN ?[phone] ? ? ? ?Other Instructions ?  ?

## 2021-07-20 NOTE — Progress Notes (Signed)
Electrophysiology Office Note   Date:  07/20/2021   ID:  Roy Dyer, DOB May 20, 1965, MRN [id number]  PCP:  Roy Sou, MD  Cardiologist:  Roy Dyer Primary Electrophysiologist:  Roy Caridi [name], MD    Chief Complaint: Atrial fibrillation   History of Present Illness: Roy Dyer is a 56 y.o. male who is being seen today for the evaluation of atrial fibrillation at the request of Roy Dyer, Roy Blackwater, MD. Presenting today for electrophysiology evaluation.  He has a history significant for atrial flutter, pleuritic chest pain due to pericarditis, ischemic cardiomyopathy, coronary artery disease with multiple stents, CKD, atrial fibrillation.  He is admitted to the hospital September 2022 with new onset atrial fibrillation and acute pericarditis type symptoms.  You converted to sinus rhythm on amiodarone.  Echo showed an ejection fraction of 30 to 35% with a normal right ventricle.  He then presented to the emergency room with heart rates of 150 by his Apple Watch.  Amiodarone was restarted and converted to sinus rhythm overnight.  Today, denies symptoms of palpitations, chest pain, shortness of breath, orthopnea, PND, lower extremity edema, claudication, dizziness, presyncope, syncope, bleeding, or neurologic sequela. The patient is tolerating medications without difficulties.  He overall feels well.  He is noted no further arrhythmias.  Device transmission also shows ventricular arrhythmias.  His main issue today is that he has not been sleeping.  He does remain interested in atrial fibrillation ablation.   Past Medical History:  Diagnosis Date   AICD (automatic cardioverter/defibrillator) present    Dr. Paschal Dyer follows remotely-yrly checks, Dr. Jonne Dyer   Atrial flutter ([facility]) 01/2021   dx'd when in [facility] for pericarditis->amiodarone+eliquis   Avascular necrosis of bone of hip ([place]) 2010 surg   Left hip arthroplasty: from chronic systemic steroids taken for his  Lupus   Barrett's esophagus 2017; 2021   Blood transfusion without reported diagnosis 2010, 2012   CAD (coronary artery disease)    **DAPT long term recommended by cards**.  a. stents RCA/Circ 2001 b. BMS to LAD 07/2010  c. 01/2017: s/p DES to RCA.    Cataract    left eye   CHF (congestive heart failure) (HCC)    Chronic headaches 02/2017   As of 01/2017, HA's no better, neurologist ordered CT.  ESR normal at that time.  HA'S COMPLETELY RESOLVED AFTER HE GOT ON CPAP 2019/2020.   Chronic renal insufficiency, stage 3 (moderate) (HCC)    GFR 55-60 (sCr 1.4-1.7 range)   Erythrocytosis    Pre and post transplant->on losartan for this   GERD (gastroesophageal reflux disease)    Hx of esoph stricture and dilatation.  +Barrett's esophagus+hx of aspiration pneumonia-->to get rpt EGD when he Dyer off DAPT (utd as of 01/29/18)   Gout    s/p renal transplant he was weened off of his allopurinol.   Hiatal hernia    History of adenomatous polyp of colon 09/2019   polyp x 1. Recall 5 yrs   History of end stage renal disease 04/24/2011   Secondary to SLE: HD 06/2011-10/2012 (then got renal transplant)   History of renal transplant 11/10/2012   10/2012 [facility]    Hyperlipidemia    Hypertension    implantable cardiac defibrillator single chamber    Medtronic (due to low EF)   Ischemic cardiomyopathy    Chronic systolic dysfunction--  65/0354 EF 30-35%.   Single chamber ICD 11/20/10 (Dr. Caryl Dyer)   Left ventricular thrombus 2012   Re-eval 02/2011 showed thrombus RESOLVED,  so coumadin was d/c'd (was on it for 62mo   Lupus ([facility]    OSA on CPAP 2018   Roy Dyer: +OSA on home sleep studay; CPAP auto titrate 5-20 started 05/2017.   Osteoporosis    Pericarditis associated with systemic lupus erythematosus [place] 01/2021   hosp->colchicine   Prediabetes 12/2019   A1c stable long term at 5.6-5.7 until jump up to 6.2% Aug 2021.   Past Surgical History:  Procedure Laterality Date   AV FISTULA PLACEMENT   03/28/2011   Procedure: ARTERIOVENOUS (AV) FISTULA CREATION;  Surgeon: Roy Posner MD;  Location: [facility]  Service: Vascular;  Laterality: Left;  Creation of Left radiocephallic cimino fistula   AV FISTULA PLACEMENT  04/27/2011   Procedure: ARTERIOVENOUS (AV) FISTULA CREATION;  Surgeon: Roy Posner MD;  Location: [facility]  Service: Vascular;  Laterality: Left;  left basilic vein transposition   BIOPSY  09/14/2019   Procedure: BIOPSY;  Surgeon: Roy Bears MD;  Location: [facility];  Service: Gastroenterology;;   CARDIAC CATHETERIZATION  08/2010; 04/22/20   08/2010; 04/2020->mild/mod in-stent restenosis but none hemodynamically signif, severe LV dysfxn, frequenct PVCs-->referred to HF clinic for optimization of med mgmt   CARDIOVASCULAR STRESS TEST  07/2010; 03/2014   03/2014 showed large old infarct and EF 30-35% but no ischemia   COLONOSCOPY  09/14/2019   Adenoma x 1.  Recall 5 yrs   COLONOSCOPY WITH PROPOFOL N/A 09/14/2019   Procedure: COLONOSCOPY WITH PROPOFOL;  Surgeon: Roy Bears MD;  Location: [facility];  Service: Gastroenterology;  Laterality: N/A;   CORONARY PRESSURE WIRE/FFR WITH 3D MAPPING N/A 04/22/2020   Procedure: Coronary Pressure Wire/FFR w/3D Mapping;  Surgeon: Roy Hampshire MD;  Location: [facility];  Service: Cardiovascular;  Laterality: N/A;   DEXA  07/05/2017   Normal bone density in radius and spine.   ESOPHAGOGASTRODUODENOSCOPY  02/2009   Done by Dr. SFuller Planfor hematemesis; esophagitis found.  Repeat recommended 09/2016, at which time he Roy Dyer also likely get his initial screening colonoscopy.   ESOPHAGOGASTRODUODENOSCOPY  09/14/2019   esophagitis (improved compared to 2017 EGD), Barrett's.  Stomach and duodenum normal.   ESOPHAGOGASTRODUODENOSCOPY (EGD) WITH PROPOFOL N/A 09/29/2015   REPEAT EGD RECOMMENDED 09/2016.  Barrett's esoph + mild chronic gastritis.  H pylori NEG. Procedure: ESOPHAGOGASTRODUODENOSCOPY (EGD) WITH PROPOFOL;  Surgeon: JJerene Bears MD;  Location: [facility];  Service: Gastroenterology;  Laterality: N/A;   ESOPHAGOGASTRODUODENOSCOPY (EGD) WITH PROPOFOL N/A 09/14/2019   Procedure: ESOPHAGOGASTRODUODENOSCOPY (EGD) WITH PROPOFOL;  Surgeon: Roy Bears MD;  Location: [facility];  Service: Gastroenterology;  Laterality: N/A;   ICD GENERATOR CHANGEOUT N/A 04/11/2018   Procedure: ICD GENERATOR CHANGEOUT;  Surgeon: KDeboraha Sprang MD;  [facility];  Service: Cardiovascular;  Laterality: N/A;   INSERT / REPLACE / REMOVE PACEMAKER     medtronic        dr nFrances Nickels   [place]   icd only   KIDNEY TRANSPLANT  10/2012   [facility] nephrologist--Dr. MBlair Heys  LEFT HEART CATH AND CORONARY ANGIOGRAPHY N/A 02/08/2017   PCA and DES to mRCA--needs DAPT for at least 6 mo.   Procedure: LEFT HEART CATH AND CORONARY ANGIOGRAPHY;  Surgeon: CSherren Mocha MD;  [facility];  Service: Cardiovascular;  Laterality: N/A;   LEFT HEART CATH AND CORONARY ANGIOGRAPHY N/A 04/22/2020   Procedure: LEFT HEART CATH AND CORONARY ANGIOGRAPHY;  Surgeon: Roy Hampshire MD;  [facility];  Service: Cardiovascular;  Laterality: N/A;   PARTIAL HIP ARTHROPLASTY Left 2010   left   POLYPECTOMY  09/14/2019   Procedure: POLYPECTOMY;  Surgeon: Roy Bears, MD;  Location: [facility];  Service: Gastroenterology;;   RENAL BIOPSY     stents     05-2010 and 2- in 2010 and 1 in 2018.   TOTAL HIP ARTHROPLASTY  07/09/2011   Procedure: TOTAL HIP ARTHROPLASTY;  Surgeon: Kerin Salen, MD;  Location: [facility];  Service: Orthopedics;  Laterality: Right;   TRANSTHORACIC ECHOCARDIOGRAM  10/13/2020   EF 30-35% (slight improved), grd III DD, elev PA pressure, mold/mod tricusp regurg   TYMPANOSTOMY TUBE PLACEMENT  56 yrs old   US ECHOCARDIOGRAPHY  12/2010; 02/2014;08/2014;08/2019   02/2014 EF still 25-30%, increased PA pressures.  2016 EF 30-35%, sept/inf hypokinesis, mod tricusp regurg. 08/2019 EF 25-30%, global hypok,  sev dil LV w/apical aneur, grd III DD, mod tricus reg, mod pulm htn   ZIO PATCH  04/2020   HIGH PVC BURDEN.  NSVT, SVT, BBB.     Current Outpatient Medications  Medication Sig Dispense Refill   allopurinol (ZYLOPRIM) 100 MG tablet Take 1 tablet (100 mg total) by mouth daily. 90 tablet 3   amiodarone (PACERONE) 200 MG tablet Take 0.5 tablets (100 mg total) by mouth daily. 45 tablet 3   apixaban (ELIQUIS) 5 MG TABS tablet Take 1 tablet (5 mg total) by mouth 2 (two) times daily. 180 tablet 3   atorvastatin (LIPITOR) 80 MG tablet Take 80 mg by mouth at bedtime.     Ca Carbonate-Mag Hydroxide (ROLAIDS PO) Take 1 tablet by mouth daily as needed (heartburn).     clopidogrel (PLAVIX) 75 MG tablet TAKE 1 TABLET BY MOUTH DAILY 90 tablet 3   colchicine 0.6 MG tablet Take 1 tablet (0.6 mg total) by mouth 2 (two) times daily. 180 tablet 3   dexlansoprazole (DEXILANT) 60 MG capsule Take 1 capsule (60 mg total) by mouth daily. 90 capsule 3   ENVARSUS XR 0.75 MG TB24 tablet Take 0.75 mg by mouth daily.     famotidine (PEPCID) 20 MG tablet Take 40 mg by mouth at bedtime.      FARXIGA 10 MG TABS tablet TAKE 1 TABLET DAILY BEFORE BREAKFAST 90 tablet 3   fluticasone (FLONASE) 50 MCG/ACT nasal spray Place 1 spray into both nostrils daily as needed for allergies.     magnesium oxide (MAG-OX) 400 MG tablet Take 400 mg by mouth daily.      melatonin 3 MG TABS tablet Take 1 tablet (3 mg total) by mouth at bedtime as needed.  0   mexiletine (MEXITIL) 150 MG capsule Take 2 capsules (300 mg total) by mouth 2 (two) times daily. 360 capsule 3   mycophenolate (CELLCEPT) 250 MG capsule Take 3 capsules (750 mg total) by mouth 2 (two) times daily. 540 capsule 1   nebivolol (BYSTOLIC) 5 MG tablet Take 2.5 mg by mouth as needed (increased heart rate).     nitroGLYCERIN (NITROSTAT) 0.4 MG SL tablet Place 1 tablet (0.4 mg total) under the tongue every 5 (five) minutes as needed for chest pain (DO NOT EXCEED 3 DOSES). 25 tablet 3    PARoxetine (PAXIL) 20 MG tablet Take 1 tablet (20 mg total) by mouth daily. 30 tablet 1   potassium chloride SA (KLOR-CON) 20 MEQ tablet Take 1 tablet (20 mEq total) by mouth as needed (for heart failure symptoms per Dr. Jeffie Pollock). 90 tablet 3  predniSONE (DELTASONE) 5 MG tablet TAKE 1 TABLET BY MOUTH DAILY 90 tablet 3   torsemide (DEMADEX) 20 MG tablet Take 1 tablet (20 mg total) by mouth every other day. 90 tablet 3   traMADol (ULTRAM) 50 MG tablet Take 50 mg by mouth every 6 (six) hours as needed. for pain  0   No current facility-administered medications for this visit.    Allergies:   Triptans   Social History:  The patient  reports that he quit smoking about 10 years ago. His smoking use included cigarettes. He has a 15.00 pack-year smoking history. He has never used smokeless tobacco. He reports that he does not drink alcohol and does not use drugs.   Family History:  The patient's family history includes Diabetes in his mother; Heart attack in his father and paternal grandfather; Heart attack (age of onset: 76) in his sister; Heart disease in his paternal aunt, paternal uncle, and sister; Hyperlipidemia in his father and sister; Hypertension in his father and sister; Kidney failure in his mother; Lupus in his mother; Stroke in his mother.   ROS:  Please see the history of present illness.   Otherwise, review of systems is positive for none.   All other systems are reviewed and negative.   PHYSICAL EXAM: VS:  BP 108/68    Pulse 60    Ht 5' 7"  (1.702 m)    Wt 190 lb (86.2 kg)    SpO2 97%    BMI 29.76 kg/m  , BMI Body mass index is 29.76 kg/m. GEN: Well nourished, well developed, in no acute distress  HEENT: normal  Neck: no JVD, carotid bruits, or masses Cardiac: RRR; no murmurs, rubs, or gallops,no edema  Respiratory:  clear to auscultation bilaterally, normal work of breathing GI: soft, nontender, nondistended, + BS MS: no deformity or atrophy  Skin: warm and dry, device site  well healed Neuro:  Strength and sensation are intact Psych: euthymic mood, full affect  EKG:  EKG is not ordered today. Personal review of the ekg ordered 06/06/21 shows sinus rhythm, rate 59, left bundle branch block  Personal review of the device interrogation today. Results in [facility]: 02/04/2021: TSH 1.942 03/06/2021: Magnesium 2.1 04/13/2021: Hemoglobin 14.6; Platelets 234 05/02/2021: B Natriuretic Peptide 1,152.1 07/07/2021: ALT 9; BUN 17; Creatinine, Ser 1.68; Potassium 4.4; Sodium 141    Lipid Panel     Component Value Date/Time   CHOL 129 07/07/2021 0905   TRIG 75.0 07/07/2021 0905   HDL 51.70 07/07/2021 0905   CHOLHDL 2 07/07/2021 0905   VLDL 15.0 07/07/2021 0905   LDLCALC 62 07/07/2021 0905     Wt Readings from Last 3 Encounters:  07/20/21 190 lb (86.2 kg)  07/07/21 189 lb 12.8 oz (86.1 kg)  05/02/21 194 lb 3.2 oz (88.1 kg)      Other studies Reviewed: Additional studies/ records that were reviewed today include: TTE 02/04/21  Review of the above records today demonstrates:   1. Left ventricular ejection fraction, by estimation, is 30 to 35%. The  left ventricle has moderately decreased function. The left ventricle  demonstrates global hypokinesis. The left ventricular internal cavity size  was moderately dilated. There is mild   left ventricular hypertrophy. Left ventricular diastolic parameters are  consistent with Grade II diastolic dysfunction (pseudonormalization).   2. Right ventricular systolic function is normal. The right ventricular  size is normal. There is moderately elevated pulmonary artery systolic  pressure. The estimated right ventricular  systolic pressure is 01.7 mmHg.   3. Left atrial size was severely dilated.   4. Right atrial size was severely dilated.   5. The mitral valve is normal in structure. Trivial mitral valve  regurgitation. No evidence of mitral stenosis.   6. Tricuspid valve regurgitation is moderate.   7.  The aortic valve is normal in structure. Aortic valve regurgitation is  not visualized. Mild aortic valve sclerosis is present, with no evidence  of aortic valve stenosis.   8. The inferior vena cava is dilated in size with <50% respiratory  variability, suggesting right atrial pressure of 15 mmHg.   Cardiac monitor 01/21/2021 personally reviewed 1. Sinus rhythm with 1AV block - avg HR of 59 bpm.  2. Four runs of non-sustained VT 4 The run with the fastest interval lasting 10 beats with a max rate of 154 bpm (avg 130 bpm); the run with the fastest interval was also the longest. 3. Rare PACs. 4.  Isolated SVEs were rare (<1.0%) 5. Frequent PVCs (11.8%, [id number]) with one morphology  6. Ventricular Couplets were  occasional (4.2%, 12144), and VE Triplets were rare (<1.0%, 386). Ventricular Bigeminy and Trigeminy were present.  ASSESSMENT AND Dyer:  1.  Persistent atrial fibrillation/flutter: Currently on bisoprolol 2.5 mg daily, amiodarone 100 mg daily, Eliquis 2.5 mg twice daily.  CHA2DS2-VASc of 3.  High risk medication monitoring.  We Samari Gorby Dyer for repeat echo as his left atrium was severely enlarged.  As he is remained in sinus rhythm, the hope is that he would have positive remodeling.  If he does, ablation would be a reasonable option to get off of amiodarone.  2.  PVCs/nonsustained VT: Cardiac monitor with a 4% burden.  Currently on mexiletine 150 mg twice daily and amiodarone as above.  High risk medication monitoring via ECG.  Continue with current management  3.  Ischemic cardiomyopathy: Status post Medtronic ICD.  Device functioning appropriately.  No changes at this time.  He appears to have developed a left bundle branch block.  He may require device upgrade and generator change.  4.  Coronary artery disease: Multiple stents to the L's, device upgrade may be reasonable.  AD, RCA, circumflex.  No ischemic symptoms.  5.  Pericarditis: On admission to the hospital September 2022.   Therapy per primary cardiology.  6.  Secondary hypercoagulable state: Continue anticoagulation for atrial fibrillation as above.  Current medicines are reviewed at length with the patient today.   The patient does not have concerns regarding his medicines.  The following changes were made today: none  Labs/ tests ordered today include:  Orders Placed This Encounter  Procedures   ECHOCARDIOGRAM COMPLETE     Disposition:   FU with Jawanza Zambito 6 months  Signed, Leyland Kenna [name], MD  07/20/2021 9:06 AM     [facility] [address] [place] [address] [phone] (office) [phone] (fax)

## 2021-07-27 ENCOUNTER — Other Ambulatory Visit: Payer: Self-pay

## 2021-07-27 ENCOUNTER — Ambulatory Visit: Payer: Managed Care, Other (non HMO) | Admitting: Family Medicine

## 2021-07-27 ENCOUNTER — Encounter: Payer: Self-pay | Admitting: Family Medicine

## 2021-07-27 VITALS — BP 110/65 | HR 62 | Temp 97.6°F | Ht 67.0 in | Wt 187.4 lb

## 2021-07-27 DIAGNOSIS — F411 Generalized anxiety disorder: Secondary | ICD-10-CM | POA: Diagnosis not present

## 2021-07-27 MED ORDER — PAROXETINE HCL 40 MG PO TABS: 40.0000 mg | ORAL_TABLET | Freq: Every evening | ORAL | 1 refills | Status: DC

## 2021-07-27 NOTE — Progress Notes (Signed)
OFFICE VISIT ? ?07/27/2021 ? ?CC:  ?Chief Complaint  ?Patient presents with  ? Anxiety  ? ?Patient is a 56 y.o. male who presents for 3-week follow-up generalized anxiety. ?A/P as of last visit: ?"#1 GAD. ?Start paroxetine 20 mg a day.  Therapeutic expectations and side effect profile of medication discussed today.  Patient's questions answered." ?Labs last visit: "Creatinine stable at 1.68, ?Tacrolimus level 5.6, which is appropriate. ?Hba1c stable at 6.3%. ?No new recommendations". ? ?INTERIM HX: ?He does not note any significant difference in his anxiety but his wife has commented that she feels like he is less irritable. ?Still thinking about what he has to do at work all the time, mind will not shut down, does not sleep well.  Denies panic and denies any depressed mood.  No side effects from Paxil. ? ?Past Medical History:  ?Diagnosis Date  ? AICD (automatic cardioverter/defibrillator) present   ? Dr. Paschal Dopp follows remotely-yrly checks, Dr. Nishan,cardiology  ? Atrial flutter ([place]) 01/2021  ? dx'd when in hosp for pericarditis->amiodarone+eliquis  ? Avascular necrosis of bone of hip ([place]) 2010 surg  ? Left hip arthroplasty: from chronic systemic steroids taken for his Lupus  ? Barrett's esophagus 2017; 2021  ? Blood transfusion without reported diagnosis 2010, 2012  ? CAD (coronary artery disease)   ? **DAPT long term recommended by cards**.  a. stents RCA/Circ 2001 b. BMS to LAD 07/2010  c. 01/2017: s/p DES to RCA.   ? Cataract   ? left eye  ? CHF (congestive heart failure) ([place])   ? Chronic headaches 02/2017  ? As of 01/2017, HA's no better, neurologist ordered CT.  ESR normal at that time.  HA'S COMPLETELY RESOLVED AFTER HE GOT ON CPAP 2019/2020.  ? Chronic renal insufficiency, stage 3 (moderate) (HCC)   ? GFR 55-60 (sCr 1.4-1.7 range)  ? Erythrocytosis   ? Pre and post transplant->on losartan for this  ? GERD (gastroesophageal reflux disease)   ? Hx of esoph stricture and dilatation.  +Barrett's esophagus+hx  of aspiration pneumonia-->to get rpt EGD when he comes off DAPT (utd as of 01/29/18)  ? Gout   ? s/p renal transplant he was weened off of his allopurinol.  ? Hiatal hernia   ? History of adenomatous polyp of colon 09/2019  ? polyp x 1. Recall 5 yrs  ? 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 Secondary to SLE: HD 06/2011-10/2012 (then got renal transplant)  ? History of renal transplant 11/10/2012  ? 10/2012 [facility]   ? Hyperlipidemia   ? Hypertension   ? implantable cardiac defibrillator single chamber   ? Medtronic (due to low EF)  ? Ischemic cardiomyopathy   ? Chronic systolic dysfunction--  56/3875 EF 30-35%.   Single chamber ICD 11/20/10 ([name])  ? LBBB (left bundle branch block) 2023  ? Left ventricular thrombus 2012  ? Re-eval 02/2011 showed thrombus RESOLVED, so coumadin was d/c'd (was on it for 56mo  ? [place]   ? OSA on CPAP 2018  ? Dr. [name]9/2018: +OSA on home sleep studay; CPAP auto titrate 5-20 started 05/2017.  ? Osteoporosis   ? Pericarditis associated with systemic lupus erythematosus ([place] 01/20[place]  ? hosp->colchicine  ? Prediabetes 12/2019  ? A1c stable long term at 5.6-5.7 until jump up to 6.2% Aug 2021.  ? ? ?Past Surgical History:  ?Procedure Laterality Date  ? AV FISTULA PLACEMENT  03/28/2011  ? Procedure: ARTERIOVENOUS (AV) FISTULA CREATION;  Surgeon: TRosetta Posner MD;  [facility];  Service: Vascular;  Laterality: Left;  Creation of Left radiocephallic cimino fistula  ? AV FISTULA PLACEMENT  04/27/2011  ? Procedure: ARTERIOVENOUS (AV) FISTULA CREATION;  Surgeon: Rosetta Posner, MD;  Location: [facility];  Service: Vascular;  Laterality: Left;  left basilic vein transposition  ? BIOPSY  09/14/2019  ? Procedure: BIOPSY;  Surgeon: Jerene Bears, MD;  [facility];  Service: Gastroenterology;;  ? CARDIAC CATHETERIZATION  08/2010; 04/22/20  ? 08/2010; 04/2020->mild/mod in-stent restenosis but none hemodynamically signif, severe LV dysfxn, frequenct PVCs-->referred to HF clinic for  optimization of med mgmt  ? CARDIOVASCULAR STRESS TEST  07/2010; 03/2014  ? 03/2014 showed large old infarct and EF 30-35% but no ischemia  ? COLONOSCOPY  09/14/2019  ? Adenoma x 1.  Recall 5 yrs  ? COLONOSCOPY WITH PROPOFOL N/A 09/14/2019  ? Procedure: COLONOSCOPY WITH PROPOFOL;  Surgeon: Jerene Bears, MD;  [facility];  Service: Gastroenterology;  Laterality: N/A;  ? CORONARY PRESSURE WIRE/FFR WITH 3D MAPPING N/A 04/22/2020  ? Procedure: Coronary Pressure Wire/FFR w/3D Mapping;  Surgeon: Wellington Hampshire, MD;  Location: [facility] [facility];  Service: Cardiovascular;  Laterality: N/A;  ? DEXA  07/05/2017  ? Normal bone density in radius and spine.  ? ESOPHAGOGASTRODUODENOSCOPY  02/2009  ? Done by Dr. Fuller Plan for hematemesis; esophagitis found.  Repeat recommended 09/2016, at which time he will also likely get his initial screening colonoscopy.  ? ESOPHAGOGASTRODUODENOSCOPY  09/14/2019  ? esophagitis (improved compared to 2017 EGD), Barrett's.  Stomach and duodenum normal.  ? ESOPHAGOGASTRODUODENOSCOPY (EGD) WITH PROPOFOL N/A 09/29/2015  ? REPEAT EGD RECOMMENDED 09/2016.  Barrett's esoph + mild chronic gastritis.  H pylori NEG. Procedure: ESOPHAGOGASTRODUODENOSCOPY (EGD) WITH PROPOFOL;  Surgeon: Jerene Bears, MD;  [facility];  Service: Gastroenterology;  Laterality: N/A;  ? ESOPHAGOGASTRODUODENOSCOPY (EGD) WITH PROPOFOL N/A 09/14/2019  ? Procedure: ESOPHAGOGASTRODUODENOSCOPY (EGD) WITH PROPOFOL;  Surgeon: Jerene Bears, MD;  [facility];  Service: Gastroenterology;  Laterality: N/A;  ? ICD GENERATOR CHANGEOUT N/A 04/11/2018  ? Procedure: ICD GENERATOR CHANGEOUT;  Surgeon: Deboraha Sprang, MD;  Location: [place] [facility];  Service: Cardiovascular;  Laterality: N/A;  ? INSERT / REPLACE / REMOVE PACEMAKER    ? medtronic        dr [name]    [place]   icd only  ? KIDNEY TRANSPLANT  10/2012  ? [place] nephrologist--Dr. Blair Heys  ? LEFT HEART CATH AND CORONARY ANGIOGRAPHY N/A  02/08/2017  ? PCA and DES to mRCA--needs DAPT for at least 6 mo.   Procedure: LEFT HEART CATH AND CORONARY ANGIOGRAPHY;  Surgeon: Sherren Mocha, MD;  Location: [place] [facility];  Service: Cardiovascular;  Laterality: N/A;  ? LEFT HEART CATH AND CORONARY ANGIOGRAPHY N/A 04/22/2020  ? Procedure: LEFT HEART CATH AND CORONARY ANGIOGRAPHY;  Surgeon: Wellington Hampshire, MD;  Location: [facility];  Service: Cardiovascular;  Laterality: N/A;  ? PARTIAL HIP ARTHROPLASTY Left 2010  ? left  ? POLYPECTOMY  09/14/2019  ? Procedure: POLYPECTOMY;  Surgeon: Jerene Bears, MD;  [facility];  Service: Gastroenterology;;  ? RENAL BIOPSY    ? stents    ? 05-2010 and 2- in 2010 and 1 in 2018.  ? TOTAL HIP ARTHROPLASTY  07/09/2011  ? Procedure: TOTAL HIP ARTHROPLASTY;  Surgeon: Kerin Salen, MD;  Location: [facility];  Service: Orthopedics;  Laterality: Right;  ? TRANSTHORACIC ECHOCARDIOGRAM  10/13/2020  ? EF 30-35% (slight  improved), grd III DD, elev PA pressure, mold/mod tricusp regurg  ? TYMPANOSTOMY TUBE PLACEMENT  56 yrs old  ? US ECHOCARDIOGRAPHY  12/2010; 02/2014;08/2014;08/2019  ? 02/2014 EF still 25-30%, increased PA pressures.  2016 EF 30-35%, sept/inf hypokinesis, mod tricusp regurg. 08/2019 EF 25-30%, global hypok, sev dil LV w/apical aneur, grd III DD, mod tricus reg, mod pulm htn  ? ZIO PATCH  04/2020  ? HIGH PVC BURDEN.  NSVT, SVT, BBB.  ? ? ?Outpatient Medications Prior to Visit  ?Medication Sig Dispense Refill  ? allopurinol (ZYLOPRIM) 100 MG tablet Take 1 tablet (100 mg total) by mouth daily. 90 tablet 3  ? amiodarone (PACERONE) 200 MG tablet Take 0.5 tablets (100 mg total) by mouth daily. 45 tablet 3  ? apixaban (ELIQUIS) 5 MG TABS tablet Take 1 tablet (5 mg total) by mouth 2 (two) times daily. 180 tablet 3  ? atorvastatin (LIPITOR) 80 MG tablet Take 80 mg by mouth at bedtime.    ? Ca Carbonate-Mag Hydroxide (ROLAIDS PO) Take 1 tablet by mouth daily as needed (heartburn).    ? clopidogrel (PLAVIX) 75 MG  tablet TAKE 1 TABLET BY MOUTH DAILY 90 tablet 3  ? colchicine 0.6 MG tablet Take 1 tablet (0.6 mg total) by mouth 2 (two) times daily. 180 tablet 3  ? dexlansoprazole (DEXILANT) 60 MG capsule Take 1 capsule (60 mg tot

## 2021-07-31 ENCOUNTER — Other Ambulatory Visit: Payer: Self-pay

## 2021-07-31 ENCOUNTER — Ambulatory Visit (HOSPITAL_COMMUNITY): Payer: Managed Care, Other (non HMO) | Attending: Cardiology

## 2021-07-31 DIAGNOSIS — I5022 Chronic systolic (congestive) heart failure: Secondary | ICD-10-CM | POA: Diagnosis present

## 2021-07-31 DIAGNOSIS — I480 Paroxysmal atrial fibrillation: Secondary | ICD-10-CM | POA: Diagnosis present

## 2021-07-31 LAB — ECHOCARDIOGRAM COMPLETE
Area-P 1/2: 4.36 cm2
MV M vel: 4.63 m/s
MV Peak grad: 85.7 mmHg
Radius: 0.55 cm
S' Lateral: 5.1 cm

## 2021-07-31 MED ORDER — PERFLUTREN LIPID MICROSPHERE
1.0000 mL | INTRAVENOUS | Status: AC | PRN
  Administered 2021-07-31: 1 mL via INTRAVENOUS

## 2021-08-10 ENCOUNTER — Encounter: Payer: Self-pay | Admitting: Family Medicine

## 2021-08-11 ENCOUNTER — Encounter: Payer: Self-pay | Admitting: Cardiology

## 2021-08-15 ENCOUNTER — Telehealth: Payer: Self-pay | Admitting: Cardiology

## 2021-08-15 NOTE — Telephone Encounter (Addendum)
See echo result ?

## 2021-08-15 NOTE — Telephone Encounter (Signed)
Patient returned call. Transferred to RN.  ?

## 2021-08-19 ENCOUNTER — Other Ambulatory Visit: Payer: Self-pay | Admitting: Family Medicine

## 2021-08-24 ENCOUNTER — Ambulatory Visit: Payer: Managed Care, Other (non HMO) | Admitting: Family Medicine

## 2021-08-24 ENCOUNTER — Encounter: Payer: Self-pay | Admitting: Family Medicine

## 2021-08-24 VITALS — BP 122/75 | HR 68 | Temp 98.1°F | Ht 67.0 in | Wt 183.6 lb

## 2021-08-24 DIAGNOSIS — F5101 Primary insomnia: Secondary | ICD-10-CM

## 2021-08-24 MED ORDER — CLONAZEPAM 0.5 MG PO TABS: ORAL_TABLET | ORAL | 1 refills | Status: AC

## 2021-08-24 MED ORDER — PAROXETINE HCL 40 MG PO TABS: 40.0000 mg | ORAL_TABLET | Freq: Every evening | ORAL | 1 refills | Status: AC

## 2021-08-24 NOTE — Progress Notes (Signed)
OFFICE VISIT ? ?08/24/2021 ? ?CC:  ?Chief Complaint  ?Patient presents with  ? Anxiety  ? ?Patient is a 56 y.o. male who presents for 1 month follow-up generalized anxiety. ?A/P as of last visit: ?"GAD. ?Mild positive response to Paxil 20 mg, particularly noted by his wife. ?Will increase Paxil to 40 mg a day and recheck in 1 month. ?  ?Of note, he has a repeat echo scheduled for early next week.  Electrophysiology is considering ablation after that." ? ?INTERIM HX: ?Feels like anxiety has gradually lessened.  Does not carry as much "weight on his shoulders "about all his stresses and medical problems. ?He is pretty pleased with Paxil at the 40 mg dose at this point. ?However, he still has a bad problem with insomnia that has been going on for 3 to 4 months or so. ?Denies any persistent restless feeling in his legs.  His wife notes he moves his legs a lot in his sleep. ?He basically says he lays there a lot trying to go to sleep, falls asleep for 10 to 15 minutes but feels like he has been asleep for a long time.  This frustrates him in the cycle perpetuates. ?No change in daily or evening routine.  No recent new meds.  He does not associate onset of the symptoms with any of his medications. ?Typically gets up 1-2 times a night to pee, unchanged. ? ?Has a remote history of similar insomnia and was on Ambien and does recall that it helped. ? ?Past Medical History:  ?Diagnosis Date  ? AICD (automatic cardioverter/defibrillator) present   ? Dr. Paschal Dopp follows remotely-yrly checks, Dr. Nishan,cardiology  ? Atrial flutter ([place]) 01/2021  ? dx'd when in hosp for pericarditis->amiodarone+eliquis  ? Avascular necrosis of bone of hip ([place]) 2010 surg  ? Left hip arthroplasty: from chronic systemic steroids taken for his Lupus  ? Barrett's esophagus 2017; 2021  ? Blood transfusion without reported diagnosis 2010, 2012  ? CAD (coronary artery disease)   ? **DAPT long term recommended by cards**.  a. stents RCA/Circ 2001 b. BMS to  LAD 07/2010  c. 01/2017: s/p DES to RCA.   ? Cataract   ? left eye  ? CHF (congestive heart failure) ([place])   ? Chronic headaches 02/2017  ? As of 01/2017, HA's no better, neurologist ordered CT.  ESR normal at that time.  HA'S COMPLETELY RESOLVED AFTER HE GOT ON CPAP 2019/2020.  ? Chronic renal insufficiency, stage 3 (moderate) (HCC)   ? GFR 55-60 (sCr 1.4-1.7 range)  ? Erythrocytosis   ? Pre and post transplant->on losartan for this  ? GERD (gastroesophageal reflux disease)   ? Hx of esoph stricture and dilatation.  +Barrett's esophagus+hx of aspiration pneumonia-->to get rpt EGD when he comes off DAPT (utd as of 01/29/18)  ? Gout   ? s/p renal transplant he was weened off of his allopurinol.  ? Hiatal hernia   ? History of adenomatous polyp of colon 09/2019  ? polyp x 1. Recall 5 yrs  ? 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 Secondary to SLE: HD 06/2011-10/2012 (then got renal transplant)  ? History of renal transplant 11/10/2012  ? [facility]   ? Hyperlipidemia   ? Hypertension   ? implantable cardiac defibrillator single chamber   ? Medtronic (due to low EF)  ? Ischemic cardiomyopathy   ? Chronic systolic dysfunction--  96/2229 EF 30-35%.   Single chamber ICD 11/20/10 ([name])  ? LBBB (left bundle  branch block) 2023  ? Left ventricular thrombus 2012  ? Re-eval 02/2011 showed thrombus RESOLVED, so coumadin was d/c'd (was on it for 48mo  ? [place]us ([place]   ? OSA on CPAP 2018  ? Dr. [name]9/2018: +OSA on home sleep studay; CPAP auto titrate 5-20 started 05/2017.  ? Osteoporosis   ? Pericarditis [place]mat[place] 01[place]  ? hosp->colchicine  ? Prediabetes 12/2019  ? A1c stable long term at 5.6-5.7 until jump up to 6.2% Aug 2021.  ? ? ?Past Surgical History:  ?Procedure Laterality Date  ? AV FISTULA PLACEMENT  03/28/2011  ? Procedure: ARTERIOVENOUS (AV) FISTULA CREATION;  Surgeon: TRosetta Posner MD;  [facility]  Service: Vascular;  Laterality: Left;  Creation of Left  radiocephallic cimino fistula  ? AV FISTULA PLACEMENT  04/27/2011  ? Procedure: ARTERIOVENOUS (AV) FISTULA CREATION;  Surgeon: TRosetta Posner MD;  [facility]  Service: Vascular;  Laterality: Left;  left basilic vein transposition  ? BIOPSY  09/14/2019  ? Procedure: BIOPSY;  Surgeon: PJerene Bears MD;  [facility];  Service: Gastroenterology;;  ? CARDIAC CATHETERIZATION  08/2010; 04/22/20  ? 08/2010; 04/2020->mild/mod in-stent restenosis but none hemodynamically signif, severe LV dysfxn, frequenct PVCs-->referred to HF clinic for optimization of med mgmt  ? CARDIOVASCULAR STRESS TEST  07/2010; 03/2014  ? 03/2014 showed large old infarct and EF 30-35% but no ischemia  ? COLONOSCOPY  09/14/2019  ? Adenoma x 1.  Recall 5 yrs  ? COLONOSCOPY WITH PROPOFOL N/A 09/14/2019  ? Procedure: COLONOSCOPY WITH PROPOFOL;  Surgeon: PJerene Bears MD;  [facility];  Service: Gastroenterology;  Laterality: N/A;  ? CORONARY PRESSURE WIRE/FFR WITH 3D MAPPING N/A 04/22/2020  ? Procedure: Coronary Pressure Wire/FFR w/3D Mapping;  Surgeon: AWellington Hampshire MD;  [facility];  Service: Cardiovascular;  Laterality: N/A;  ? DEXA  07/05/2017  ? Normal bone density in radius and spine.  ? ESOPHAGOGASTRODUODENOSCOPY  02/2009  ? Done by Dr. SFuller Planfor hematemesis; esophagitis found.  Repeat recommended 09/2016, at which time he will also likely get his initial screening colonoscopy.  ? ESOPHAGOGASTRODUODENOSCOPY  09/14/2019  ? esophagitis (improved compared to 2017 EGD), Barrett's.  Stomach and duodenum normal.  ? ESOPHAGOGASTRODUODENOSCOPY (EGD) WITH PROPOFOL N/A 09/29/2015  ? REPEAT EGD RECOMMENDED 09/2016.  Barrett's esoph + mild chronic gastritis.  H pylori NEG. Procedure: ESOPHAGOGASTRODUODENOSCOPY (EGD) WITH PROPOFOL;  Surgeon: JJerene Bears MD;  [facility];  Service: Gastroenterology;  Laterality: N/A;  ? ESOPHAGOGASTRODUODENOSCOPY (EGD) WITH PROPOFOL N/A 09/14/2019  ? Procedure:  ESOPHAGOGASTRODUODENOSCOPY (EGD) WITH PROPOFOL;  Surgeon: PJerene Bears MD;  [facility];  Service: Gastroenterology;  Laterality: N/A;  ? ICD GENERATOR CHANGEOUT N/A 04/11/2018  ? Procedure: ICD GENERATOR CHANGEOUT;  Surgeon: KDeboraha Sprang MD;  [facility];  Service: Cardiovascular;  Laterality: N/A;  ? INSERT / REPLACE / REMOVE PACEMAKER    ? medtronic        dr nFrances Nickels   [place]   icd only  ? KIDNEY TRANSPLANT  10/2012  ? [place]--Dr. MBlair Heys ? LEFT HEART CATH AND CORONARY ANGIOGRAPHY N/A 02/08/2017  ? PCA and DES to mRCA--needs DAPT for at least 6 mo.   Procedure: LEFT HEART CATH AND CORONARY ANGIOGRAPHY;  Surgeon: CSherren Mocha MD;  [facility];  Service: Cardiovascular;  Laterality: N/A;  ? LEFT HEART CATH AND CORONARY ANGIOGRAPHY N/A 04/22/2020  ?  Procedure: LEFT HEART CATH AND CORONARY ANGIOGRAPHY;  Surgeon: Wellington Hampshire, MD;  Location: [facility];  Service: Cardiovascular;  Laterality: N/A;  ? PARTIAL HIP ARTHROPLASTY Left 2010  ? left  ? POLYPECTOMY  09/14/2019  ? Procedure: POLYPECTOMY;  Surgeon: Jerene Bears, MD;  [facility];  Service: Gastroenterology;;  ? RENAL BIOPSY    ? stents    ? 05-2010 and 2- in 2010 and 1 in 2018.  ? TOTAL HIP ARTHROPLASTY  07/09/2011  ? Procedure: TOTAL HIP ARTHROPLASTY;  Surgeon: Kerin Salen, MD;  Location: [facility];  Service: Orthopedics;  Laterality: Right;  ? TRANSTHORACIC ECHOCARDIOGRAM  10/13/2020  ? 10/2020 EF 30-35% (slight improved), grd III DD, elev PA pressure, mold/mod tricusp regurg.  01/2021 and 07/2021 echo essentially unchanged.  ? TYMPANOSTOMY TUBE PLACEMENT  56 yrs old  ? US ECHOCARDIOGRAPHY  12/2010; 02/2014;08/2014;08/2019  ? 2015 EF still 25-30%, inc'd  PA press.  2016 EF 30-35%, sept/inf hypokin, mod tricusp regurg. 08/2019 EF 25-30%, global hypok, sev dil LV w/apical aneur, grd III DD, mod tricus reg, mod pulm htn  ? ZIO PATCH  04/2020  ? HIGH PVC BURDEN.  NSVT, SVT,  BBB.  ? ? ?Outpatient Medications Prior to Visit  ?Medication Sig Dispense Refill  ? allopurinol (ZYLOPRIM) 100 MG tablet Take 1 tablet (100 mg total) by mouth daily. 90 tablet 3  ? amiodarone (PACERONE) 200 MG tablet

## 2021-08-31 ENCOUNTER — Ambulatory Visit (INDEPENDENT_AMBULATORY_CARE_PROVIDER_SITE_OTHER): Payer: Managed Care, Other (non HMO)

## 2021-08-31 DIAGNOSIS — I255 Ischemic cardiomyopathy: Secondary | ICD-10-CM | POA: Diagnosis not present

## 2021-08-31 LAB — CUP PACEART REMOTE DEVICE CHECK
Battery Remaining Longevity: 103 mo
Battery Voltage: 3 V
Brady Statistic RV Percent Paced: 0.07 %
Date Time Interrogation Session: 20230419222042
HighPow Impedance: 41 Ohm
HighPow Impedance: 49 Ohm
Implantable Lead Implant Date: 20120709
Implantable Lead Location: 753860
Implantable Lead Model: 6947
Implantable Pulse Generator Implant Date: 20191129
Lead Channel Impedance Value: 285 Ohm
Lead Channel Impedance Value: 342 Ohm
Lead Channel Pacing Threshold Amplitude: 1.375 V
Lead Channel Pacing Threshold Pulse Width: 0.4 ms
Lead Channel Sensing Intrinsic Amplitude: 13.125 mV
Lead Channel Sensing Intrinsic Amplitude: 13.125 mV
Lead Channel Setting Pacing Amplitude: 2.5 V
Lead Channel Setting Pacing Pulse Width: 0.5 ms
Lead Channel Setting Sensing Sensitivity: 0.3 mV

## 2021-09-11 ENCOUNTER — Encounter: Payer: Self-pay | Admitting: Family Medicine

## 2021-09-11 ENCOUNTER — Other Ambulatory Visit: Payer: Self-pay

## 2021-09-11 ENCOUNTER — Encounter (HOSPITAL_COMMUNITY): Payer: Self-pay | Admitting: Internal Medicine

## 2021-09-11 ENCOUNTER — Emergency Department (HOSPITAL_COMMUNITY): Payer: Managed Care, Other (non HMO)

## 2021-09-11 ENCOUNTER — Ambulatory Visit: Payer: Managed Care, Other (non HMO) | Admitting: Family Medicine

## 2021-09-11 ENCOUNTER — Inpatient Hospital Stay (HOSPITAL_COMMUNITY)
Admission: EM | Admit: 2021-09-11 | Discharge: 2021-10-12 | DRG: 286 | Disposition: E | Payer: Managed Care, Other (non HMO) | Attending: Student | Admitting: Student

## 2021-09-11 VITALS — BP 123/74 | HR 79 | Temp 97.6°F | Ht 67.0 in | Wt 183.8 lb

## 2021-09-11 DIAGNOSIS — M3214 Glomerular disease in systemic lupus erythematosus: Secondary | ICD-10-CM | POA: Diagnosis present

## 2021-09-11 DIAGNOSIS — I4892 Unspecified atrial flutter: Secondary | ICD-10-CM | POA: Diagnosis present

## 2021-09-11 DIAGNOSIS — I272 Pulmonary hypertension, unspecified: Secondary | ICD-10-CM | POA: Diagnosis present

## 2021-09-11 DIAGNOSIS — I25118 Atherosclerotic heart disease of native coronary artery with other forms of angina pectoris: Secondary | ICD-10-CM | POA: Diagnosis present

## 2021-09-11 DIAGNOSIS — I5043 Acute on chronic combined systolic (congestive) and diastolic (congestive) heart failure: Secondary | ICD-10-CM | POA: Diagnosis present

## 2021-09-11 DIAGNOSIS — I130 Hypertensive heart and chronic kidney disease with heart failure and stage 1 through stage 4 chronic kidney disease, or unspecified chronic kidney disease: Secondary | ICD-10-CM | POA: Diagnosis present

## 2021-09-11 DIAGNOSIS — R0609 Other forms of dyspnea: Secondary | ICD-10-CM

## 2021-09-11 DIAGNOSIS — I10 Essential (primary) hypertension: Secondary | ICD-10-CM | POA: Diagnosis present

## 2021-09-11 DIAGNOSIS — J690 Pneumonitis due to inhalation of food and vomit: Secondary | ICD-10-CM | POA: Diagnosis present

## 2021-09-11 DIAGNOSIS — M329 Systemic lupus erythematosus, unspecified: Secondary | ICD-10-CM | POA: Diagnosis present

## 2021-09-11 DIAGNOSIS — K219 Gastro-esophageal reflux disease without esophagitis: Secondary | ICD-10-CM | POA: Diagnosis present

## 2021-09-11 DIAGNOSIS — I319 Disease of pericardium, unspecified: Secondary | ICD-10-CM

## 2021-09-11 DIAGNOSIS — Z83438 Family history of other disorder of lipoprotein metabolism and other lipidemia: Secondary | ICD-10-CM

## 2021-09-11 DIAGNOSIS — R6521 Severe sepsis with septic shock: Secondary | ICD-10-CM | POA: Diagnosis not present

## 2021-09-11 DIAGNOSIS — N189 Chronic kidney disease, unspecified: Secondary | ICD-10-CM | POA: Diagnosis not present

## 2021-09-11 DIAGNOSIS — E871 Hypo-osmolality and hyponatremia: Secondary | ICD-10-CM | POA: Diagnosis present

## 2021-09-11 DIAGNOSIS — N1831 Chronic kidney disease, stage 3a: Secondary | ICD-10-CM | POA: Diagnosis present

## 2021-09-11 DIAGNOSIS — Z940 Kidney transplant status: Secondary | ICD-10-CM

## 2021-09-11 DIAGNOSIS — I25119 Atherosclerotic heart disease of native coronary artery with unspecified angina pectoris: Secondary | ICD-10-CM | POA: Diagnosis present

## 2021-09-11 DIAGNOSIS — E782 Mixed hyperlipidemia: Secondary | ICD-10-CM | POA: Diagnosis not present

## 2021-09-11 DIAGNOSIS — Z8249 Family history of ischemic heart disease and other diseases of the circulatory system: Secondary | ICD-10-CM

## 2021-09-11 DIAGNOSIS — Z66 Do not resuscitate: Secondary | ICD-10-CM | POA: Diagnosis not present

## 2021-09-11 DIAGNOSIS — M810 Age-related osteoporosis without current pathological fracture: Secondary | ICD-10-CM | POA: Diagnosis present

## 2021-09-11 DIAGNOSIS — I5082 Biventricular heart failure: Secondary | ICD-10-CM | POA: Diagnosis not present

## 2021-09-11 DIAGNOSIS — Z955 Presence of coronary angioplasty implant and graft: Secondary | ICD-10-CM

## 2021-09-11 DIAGNOSIS — R0902 Hypoxemia: Secondary | ICD-10-CM

## 2021-09-11 DIAGNOSIS — R079 Chest pain, unspecified: Secondary | ICD-10-CM

## 2021-09-11 DIAGNOSIS — M879 Osteonecrosis, unspecified: Secondary | ICD-10-CM | POA: Diagnosis present

## 2021-09-11 DIAGNOSIS — Z7960 Long term (current) use of unspecified immunomodulators and immunosuppressants: Secondary | ICD-10-CM

## 2021-09-11 DIAGNOSIS — J189 Pneumonia, unspecified organism: Secondary | ICD-10-CM | POA: Diagnosis present

## 2021-09-11 DIAGNOSIS — Z87891 Personal history of nicotine dependence: Secondary | ICD-10-CM

## 2021-09-11 DIAGNOSIS — J80 Acute respiratory distress syndrome: Secondary | ICD-10-CM | POA: Diagnosis present

## 2021-09-11 DIAGNOSIS — I483 Typical atrial flutter: Secondary | ICD-10-CM | POA: Diagnosis not present

## 2021-09-11 DIAGNOSIS — Z832 Family history of diseases of the blood and blood-forming organs and certain disorders involving the immune mechanism: Secondary | ICD-10-CM

## 2021-09-11 DIAGNOSIS — I480 Paroxysmal atrial fibrillation: Secondary | ICD-10-CM | POA: Diagnosis present

## 2021-09-11 DIAGNOSIS — E876 Hypokalemia: Secondary | ICD-10-CM | POA: Diagnosis not present

## 2021-09-11 DIAGNOSIS — Z888 Allergy status to other drugs, medicaments and biological substances status: Secondary | ICD-10-CM

## 2021-09-11 DIAGNOSIS — I252 Old myocardial infarction: Secondary | ICD-10-CM

## 2021-09-11 DIAGNOSIS — Z833 Family history of diabetes mellitus: Secondary | ICD-10-CM

## 2021-09-11 DIAGNOSIS — J9601 Acute respiratory failure with hypoxia: Secondary | ICD-10-CM

## 2021-09-11 DIAGNOSIS — A419 Sepsis, unspecified organism: Secondary | ICD-10-CM | POA: Diagnosis not present

## 2021-09-11 DIAGNOSIS — Z96643 Presence of artificial hip joint, bilateral: Secondary | ICD-10-CM | POA: Diagnosis present

## 2021-09-11 DIAGNOSIS — Z95810 Presence of automatic (implantable) cardiac defibrillator: Secondary | ICD-10-CM

## 2021-09-11 DIAGNOSIS — D84821 Immunodeficiency due to drugs: Secondary | ICD-10-CM | POA: Diagnosis present

## 2021-09-11 DIAGNOSIS — I469 Cardiac arrest, cause unspecified: Secondary | ICD-10-CM | POA: Diagnosis not present

## 2021-09-11 DIAGNOSIS — T8619 Other complication of kidney transplant: Secondary | ICD-10-CM | POA: Diagnosis present

## 2021-09-11 DIAGNOSIS — R778 Other specified abnormalities of plasma proteins: Secondary | ICD-10-CM | POA: Diagnosis not present

## 2021-09-11 DIAGNOSIS — Z20822 Contact with and (suspected) exposure to covid-19: Secondary | ICD-10-CM | POA: Diagnosis present

## 2021-09-11 DIAGNOSIS — Z515 Encounter for palliative care: Secondary | ICD-10-CM

## 2021-09-11 DIAGNOSIS — I083 Combined rheumatic disorders of mitral, aortic and tricuspid valves: Secondary | ICD-10-CM | POA: Diagnosis present

## 2021-09-11 DIAGNOSIS — I5023 Acute on chronic systolic (congestive) heart failure: Secondary | ICD-10-CM | POA: Diagnosis not present

## 2021-09-11 DIAGNOSIS — N170 Acute kidney failure with tubular necrosis: Secondary | ICD-10-CM | POA: Diagnosis present

## 2021-09-11 DIAGNOSIS — R570 Cardiogenic shock: Secondary | ICD-10-CM | POA: Diagnosis not present

## 2021-09-11 DIAGNOSIS — Z7952 Long term (current) use of systemic steroids: Secondary | ICD-10-CM

## 2021-09-11 DIAGNOSIS — K2270 Barrett's esophagus without dysplasia: Secondary | ICD-10-CM | POA: Diagnosis not present

## 2021-09-11 DIAGNOSIS — I472 Ventricular tachycardia, unspecified: Secondary | ICD-10-CM | POA: Diagnosis present

## 2021-09-11 DIAGNOSIS — E440 Moderate protein-calorie malnutrition: Secondary | ICD-10-CM | POA: Diagnosis present

## 2021-09-11 DIAGNOSIS — G4733 Obstructive sleep apnea (adult) (pediatric): Secondary | ICD-10-CM | POA: Diagnosis present

## 2021-09-11 DIAGNOSIS — N179 Acute kidney failure, unspecified: Secondary | ICD-10-CM | POA: Diagnosis present

## 2021-09-11 DIAGNOSIS — F419 Anxiety disorder, unspecified: Secondary | ICD-10-CM | POA: Diagnosis present

## 2021-09-11 DIAGNOSIS — Z841 Family history of disorders of kidney and ureter: Secondary | ICD-10-CM

## 2021-09-11 DIAGNOSIS — E1122 Type 2 diabetes mellitus with diabetic chronic kidney disease: Secondary | ICD-10-CM | POA: Diagnosis present

## 2021-09-11 DIAGNOSIS — N182 Chronic kidney disease, stage 2 (mild): Secondary | ICD-10-CM | POA: Diagnosis not present

## 2021-09-11 DIAGNOSIS — Y830 Surgical operation with transplant of whole organ as the cause of abnormal reaction of the patient, or of later complication, without mention of misadventure at the time of the procedure: Secondary | ICD-10-CM | POA: Diagnosis present

## 2021-09-11 DIAGNOSIS — I255 Ischemic cardiomyopathy: Secondary | ICD-10-CM | POA: Diagnosis present

## 2021-09-11 DIAGNOSIS — Z7902 Long term (current) use of antithrombotics/antiplatelets: Secondary | ICD-10-CM

## 2021-09-11 DIAGNOSIS — R34 Anuria and oliguria: Secondary | ICD-10-CM | POA: Diagnosis not present

## 2021-09-11 DIAGNOSIS — I309 Acute pericarditis, unspecified: Secondary | ICD-10-CM | POA: Diagnosis not present

## 2021-09-11 DIAGNOSIS — I493 Ventricular premature depolarization: Secondary | ICD-10-CM | POA: Diagnosis present

## 2021-09-11 DIAGNOSIS — Z6826 Body mass index (BMI) 26.0-26.9, adult: Secondary | ICD-10-CM

## 2021-09-11 DIAGNOSIS — R627 Adult failure to thrive: Secondary | ICD-10-CM | POA: Diagnosis not present

## 2021-09-11 DIAGNOSIS — G9341 Metabolic encephalopathy: Secondary | ICD-10-CM | POA: Diagnosis present

## 2021-09-11 DIAGNOSIS — I468 Cardiac arrest due to other underlying condition: Secondary | ICD-10-CM | POA: Diagnosis not present

## 2021-09-11 DIAGNOSIS — E1165 Type 2 diabetes mellitus with hyperglycemia: Secondary | ICD-10-CM | POA: Diagnosis present

## 2021-09-11 DIAGNOSIS — Z9989 Dependence on other enabling machines and devices: Secondary | ICD-10-CM

## 2021-09-11 DIAGNOSIS — G931 Anoxic brain damage, not elsewhere classified: Secondary | ICD-10-CM | POA: Diagnosis not present

## 2021-09-11 DIAGNOSIS — Y95 Nosocomial condition: Secondary | ICD-10-CM | POA: Diagnosis not present

## 2021-09-11 DIAGNOSIS — Z7901 Long term (current) use of anticoagulants: Secondary | ICD-10-CM

## 2021-09-11 DIAGNOSIS — I4729 Other ventricular tachycardia: Secondary | ICD-10-CM

## 2021-09-11 DIAGNOSIS — F05 Delirium due to known physiological condition: Secondary | ICD-10-CM | POA: Diagnosis not present

## 2021-09-11 DIAGNOSIS — I3139 Other pericardial effusion (noninflammatory): Secondary | ICD-10-CM | POA: Diagnosis not present

## 2021-09-11 DIAGNOSIS — Z79899 Other long term (current) drug therapy: Secondary | ICD-10-CM

## 2021-09-11 DIAGNOSIS — R7982 Elevated C-reactive protein (CRP): Secondary | ICD-10-CM | POA: Diagnosis present

## 2021-09-11 DIAGNOSIS — M109 Gout, unspecified: Secondary | ICD-10-CM | POA: Diagnosis present

## 2021-09-11 LAB — COMPREHENSIVE METABOLIC PANEL
ALT: 32 U/L (ref 0–44)
AST: 36 U/L (ref 15–41)
Albumin: 3.3 g/dL — ABNORMAL LOW (ref 3.5–5.0)
Alkaline Phosphatase: 123 U/L (ref 38–126)
Anion gap: 15 (ref 5–15)
BUN: 42 mg/dL — ABNORMAL HIGH (ref 6–20)
CO2: 22 mmol/L (ref 22–32)
Calcium: 9.2 mg/dL (ref 8.9–10.3)
Chloride: 96 mmol/L — ABNORMAL LOW (ref 98–111)
Creatinine, Ser: 2.72 mg/dL — ABNORMAL HIGH (ref 0.61–1.24)
GFR, Estimated: 27 mL/min — ABNORMAL LOW (ref >60)
Glucose, Bld: 115 mg/dL — ABNORMAL HIGH (ref 70–99)
Potassium: 4.1 mmol/L (ref 3.5–5.1)
Sodium: 133 mmol/L — ABNORMAL LOW (ref 135–145)
Total Bilirubin: 1.6 mg/dL — ABNORMAL HIGH (ref 0.3–1.2)
Total Protein: 6.5 g/dL (ref 6.5–8.1)

## 2021-09-11 LAB — HEPATIC FUNCTION PANEL
ALT: 26 U/L (ref 0–44)
AST: 28 U/L (ref 15–41)
Albumin: 2.6 g/dL — ABNORMAL LOW (ref 3.5–5.0)
Alkaline Phosphatase: 99 U/L (ref 38–126)
Bilirubin, Direct: 0.5 mg/dL — ABNORMAL HIGH (ref 0.0–0.2)
Indirect Bilirubin: 0.9 mg/dL (ref 0.3–0.9)
Total Bilirubin: 1.4 mg/dL — ABNORMAL HIGH (ref 0.3–1.2)
Total Protein: 5.5 g/dL — ABNORMAL LOW (ref 6.5–8.1)

## 2021-09-11 LAB — I-STAT VENOUS BLOOD GAS, ED
Acid-base deficit: 1 mmol/L (ref 0.0–2.0)
Bicarbonate: 22.5 mmol/L (ref 20.0–28.0)
Calcium, Ion: 1.01 mmol/L — ABNORMAL LOW (ref 1.15–1.40)
HCT: 42 % (ref 39.0–52.0)
Hemoglobin: 14.3 g/dL (ref 13.0–17.0)
O2 Saturation: 99 %
Potassium: 3.9 mmol/L (ref 3.5–5.1)
Sodium: 132 mmol/L — ABNORMAL LOW (ref 135–145)
TCO2: 23 mmol/L (ref 22–32)
pCO2, Ven: 31.8 mmHg — ABNORMAL LOW (ref 44–60)
pH, Ven: 7.458 — ABNORMAL HIGH (ref 7.25–7.43)
pO2, Ven: 151 mmHg — ABNORMAL HIGH (ref 32–45)

## 2021-09-11 LAB — CBC WITH DIFFERENTIAL/PLATELET
Abs Immature Granulocytes: 0.31 10*3/uL — ABNORMAL HIGH (ref 0.00–0.07)
Basophils Absolute: 0.1 10*3/uL (ref 0.0–0.1)
Basophils Relative: 0 %
Eosinophils Absolute: 0 10*3/uL (ref 0.0–0.5)
Eosinophils Relative: 0 %
HCT: 47.9 % (ref 39.0–52.0)
Hemoglobin: 13.9 g/dL (ref 13.0–17.0)
Immature Granulocytes: 2 %
Lymphocytes Relative: 3 %
Lymphs Abs: 0.4 10*3/uL — ABNORMAL LOW (ref 0.7–4.0)
MCH: 23 pg — ABNORMAL LOW (ref 26.0–34.0)
MCHC: 29 g/dL — ABNORMAL LOW (ref 30.0–36.0)
MCV: 79.2 fL — ABNORMAL LOW (ref 80.0–100.0)
Monocytes Absolute: 1.6 10*3/uL — ABNORMAL HIGH (ref 0.1–1.0)
Monocytes Relative: 9 %
Neutro Abs: 14.9 10*3/uL — ABNORMAL HIGH (ref 1.7–7.7)
Neutrophils Relative %: 86 %
Platelets: 408 10*3/uL — ABNORMAL HIGH (ref 150–400)
RBC: 6.05 MIL/uL — ABNORMAL HIGH (ref 4.22–5.81)
RDW: 19.2 % — ABNORMAL HIGH (ref 11.5–15.5)
WBC: 17.3 10*3/uL — ABNORMAL HIGH (ref 4.0–10.5)
nRBC: 0 % (ref 0.0–0.2)

## 2021-09-11 LAB — URINALYSIS, COMPLETE (UACMP) WITH MICROSCOPIC
Bilirubin Urine: NEGATIVE
Glucose, UA: 150 mg/dL — AB
Hgb urine dipstick: NEGATIVE
Ketones, ur: NEGATIVE mg/dL
Leukocytes,Ua: NEGATIVE
Nitrite: NEGATIVE
Protein, ur: NEGATIVE mg/dL
Specific Gravity, Urine: 1.013 (ref 1.005–1.030)
pH: 5 (ref 5.0–8.0)

## 2021-09-11 LAB — RESPIRATORY PANEL BY PCR

## 2021-09-11 LAB — RESP PANEL BY RT-PCR (FLU A&B, COVID) ARPGX2
Influenza A by PCR: NEGATIVE
Influenza B by PCR: NEGATIVE
SARS Coronavirus 2 by RT PCR: NEGATIVE

## 2021-09-11 LAB — TROPONIN I (HIGH SENSITIVITY)
Troponin I (High Sensitivity): 43 ng/L — ABNORMAL HIGH (ref <18)
Troponin I (High Sensitivity): 36 ng/L — ABNORMAL HIGH (ref <18)
Troponin I (High Sensitivity): 36 ng/L — ABNORMAL HIGH (ref <18)

## 2021-09-11 LAB — SEDIMENTATION RATE: Sed Rate: 5 mm/hr (ref 0–16)

## 2021-09-11 LAB — OSMOLALITY, URINE: Osmolality, Ur: 363 mOsm/kg (ref 300–900)

## 2021-09-11 LAB — BRAIN NATRIURETIC PEPTIDE: B Natriuretic Peptide: 1553.9 pg/mL — ABNORMAL HIGH (ref 0.0–100.0)

## 2021-09-11 LAB — MAGNESIUM: Magnesium: 1.8 mg/dL (ref 1.7–2.4)

## 2021-09-11 LAB — PHOSPHORUS: Phosphorus: 4.8 mg/dL — ABNORMAL HIGH (ref 2.5–4.6)

## 2021-09-11 LAB — STREP PNEUMONIAE URINARY ANTIGEN: Strep Pneumo Urinary Antigen: NEGATIVE

## 2021-09-11 LAB — CK: Total CK: 37 U/L — ABNORMAL LOW (ref 49–397)

## 2021-09-11 MED ORDER — CLOPIDOGREL BISULFATE 75 MG PO TABS
75.0000 mg | ORAL_TABLET | Freq: Every day | ORAL | Status: DC
  Administered 2021-09-12 – 2021-09-19 (×8): 75 mg via ORAL
  Filled 2021-09-11 (×8): qty 1

## 2021-09-11 MED ORDER — POLYETHYLENE GLYCOL 3350 17 G PO PACK: 17.0000 g | PACK | Freq: Every day | ORAL | Status: DC | PRN

## 2021-09-11 MED ORDER — FAMOTIDINE 20 MG PO TABS
40.0000 mg | ORAL_TABLET | Freq: Every day | ORAL | Status: DC
  Administered 2021-09-11 – 2021-09-15 (×5): 40 mg via ORAL
  Filled 2021-09-11 (×5): qty 2

## 2021-09-11 MED ORDER — ONDANSETRON HCL 4 MG PO TABS
4.0000 mg | ORAL_TABLET | Freq: Four times a day (QID) | ORAL | Status: DC | PRN
  Filled 2021-09-11: qty 1

## 2021-09-11 MED ORDER — CLONAZEPAM 0.5 MG PO TABS: 0.2500 mg | ORAL_TABLET | Freq: Every evening | ORAL | Status: DC | PRN

## 2021-09-11 MED ORDER — SODIUM CHLORIDE 0.9 % IV SOLN
500.0000 mg | Freq: Once | INTRAVENOUS | Status: AC
  Administered 2021-09-11: 500 mg via INTRAVENOUS
  Filled 2021-09-11: qty 5

## 2021-09-11 MED ORDER — TORSEMIDE 20 MG PO TABS: 20.0000 mg | ORAL_TABLET | ORAL | Status: DC

## 2021-09-11 MED ORDER — SODIUM CHLORIDE 0.9% FLUSH
3.0000 mL | Freq: Two times a day (BID) | INTRAVENOUS | Status: DC
  Administered 2021-09-12 – 2021-09-22 (×4): 3 mL via INTRAVENOUS

## 2021-09-11 MED ORDER — CLOPIDOGREL BISULFATE 75 MG PO TABS: 75.0000 mg | ORAL_TABLET | Freq: Every day | ORAL | Status: DC

## 2021-09-11 MED ORDER — TRAMADOL HCL 50 MG PO TABS
50.0000 mg | ORAL_TABLET | Freq: Four times a day (QID) | ORAL | Status: DC | PRN
Start: 2021-09-11 — End: 2021-09-16

## 2021-09-11 MED ORDER — SODIUM CHLORIDE 0.9 % IV SOLN
1.0000 g | Freq: Once | INTRAVENOUS | Status: AC
  Administered 2021-09-11: 1 g via INTRAVENOUS
  Filled 2021-09-11: qty 10

## 2021-09-11 MED ORDER — AMIODARONE HCL 200 MG PO TABS
100.0000 mg | ORAL_TABLET | Freq: Every day | ORAL | Status: DC
Start: 2021-09-11 — End: 2021-09-11

## 2021-09-11 MED ORDER — PANTOPRAZOLE SODIUM 40 MG PO TBEC
40.0000 mg | DELAYED_RELEASE_TABLET | Freq: Every day | ORAL | Status: DC
  Administered 2021-09-12 – 2021-09-19 (×8): 40 mg via ORAL
  Filled 2021-09-11 (×8): qty 1

## 2021-09-11 MED ORDER — AMIODARONE HCL 200 MG PO TABS
100.0000 mg | ORAL_TABLET | Freq: Every day | ORAL | Status: DC
  Administered 2021-09-12 – 2021-09-19 (×8): 100 mg via ORAL
  Filled 2021-09-11 (×8): qty 1

## 2021-09-11 MED ORDER — MELATONIN 3 MG PO TABS
3.0000 mg | ORAL_TABLET | Freq: Every evening | ORAL | Status: DC | PRN
  Administered 2021-09-17 – 2021-09-19 (×3): 3 mg via ORAL
  Filled 2021-09-11 (×3): qty 1

## 2021-09-11 MED ORDER — HYDROCODONE-ACETAMINOPHEN 5-325 MG PO TABS
1.0000 | ORAL_TABLET | ORAL | Status: DC | PRN
  Administered 2021-09-14: 1 via ORAL
  Administered 2021-09-15: 2 via ORAL
  Administered 2021-09-16: 1 via ORAL
  Administered 2021-09-17 – 2021-09-19 (×2): 2 via ORAL
  Filled 2021-09-11: qty 2
  Filled 2021-09-11: qty 1
  Filled 2021-09-11 (×2): qty 2
  Filled 2021-09-11: qty 1

## 2021-09-11 MED ORDER — COLCHICINE 0.6 MG PO TABS
0.6000 mg | ORAL_TABLET | Freq: Two times a day (BID) | ORAL | Status: DC
  Filled 2021-09-11: qty 1

## 2021-09-11 MED ORDER — PREDNISONE 5 MG PO TABS
5.0000 mg | ORAL_TABLET | Freq: Every day | ORAL | Status: DC
  Administered 2021-09-12 – 2021-09-16 (×5): 5 mg via ORAL
  Filled 2021-09-11 (×5): qty 1

## 2021-09-11 MED ORDER — CLONAZEPAM 0.25 MG PO TBDP
0.2500 mg | ORAL_TABLET | Freq: Every evening | ORAL | Status: DC | PRN
  Filled 2021-09-11: qty 1

## 2021-09-11 MED ORDER — TACROLIMUS ER 0.75 MG PO TB24
0.7500 mg | ORAL_TABLET | Freq: Every day | ORAL | Status: DC
Start: 2021-09-12 — End: 2021-09-20
  Administered 2021-09-13 – 2021-09-19 (×7): 0.75 mg via ORAL
  Filled 2021-09-11 (×9): qty 1

## 2021-09-11 MED ORDER — PAROXETINE HCL 20 MG PO TABS
40.0000 mg | ORAL_TABLET | Freq: Every evening | ORAL | Status: DC
  Administered 2021-09-11 – 2021-09-15 (×5): 40 mg via ORAL
  Filled 2021-09-11 (×6): qty 2

## 2021-09-11 MED ORDER — SODIUM CHLORIDE 0.9% FLUSH
3.0000 mL | INTRAVENOUS | Status: DC | PRN
Start: 2021-09-11 — End: 2021-09-23

## 2021-09-11 MED ORDER — PANTOPRAZOLE SODIUM 40 MG PO TBEC: 40.0000 mg | DELAYED_RELEASE_TABLET | Freq: Every day | ORAL | Status: DC

## 2021-09-11 MED ORDER — ALBUTEROL SULFATE (2.5 MG/3ML) 0.083% IN NEBU: 2.5000 mg | INHALATION_SOLUTION | RESPIRATORY_TRACT | Status: DC | PRN

## 2021-09-11 MED ORDER — FUROSEMIDE 10 MG/ML IJ SOLN
40.0000 mg | Freq: Once | INTRAMUSCULAR | Status: AC
  Administered 2021-09-11: 40 mg via INTRAVENOUS
  Filled 2021-09-11: qty 4

## 2021-09-11 MED ORDER — APIXABAN 5 MG PO TABS
5.0000 mg | ORAL_TABLET | Freq: Two times a day (BID) | ORAL | Status: DC
  Administered 2021-09-11 – 2021-09-13 (×4): 5 mg via ORAL
  Filled 2021-09-11 (×4): qty 1

## 2021-09-11 MED ORDER — MEXILETINE HCL 150 MG PO CAPS
300.0000 mg | ORAL_CAPSULE | Freq: Two times a day (BID) | ORAL | Status: DC
  Administered 2021-09-11 – 2021-09-19 (×17): 300 mg via ORAL
  Filled 2021-09-11 (×19): qty 2

## 2021-09-11 MED ORDER — SODIUM CHLORIDE 0.9 % IV SOLN
2.0000 g | INTRAVENOUS | Status: DC
  Administered 2021-09-12 – 2021-09-13 (×2): 2 g via INTRAVENOUS
  Filled 2021-09-11 (×3): qty 20

## 2021-09-11 MED ORDER — TACROLIMUS ER 0.75 MG PO TB24: 0.7500 mg | ORAL_TABLET | Freq: Every day | ORAL | Status: DC

## 2021-09-11 MED ORDER — ACETAMINOPHEN 325 MG PO TABS: 650.0000 mg | ORAL_TABLET | Freq: Four times a day (QID) | ORAL | Status: DC | PRN

## 2021-09-11 MED ORDER — GUAIFENESIN ER 600 MG PO TB12
600.0000 mg | ORAL_TABLET | Freq: Two times a day (BID) | ORAL | Status: DC
  Administered 2021-09-12 – 2021-09-19 (×16): 600 mg via ORAL
  Filled 2021-09-11 (×17): qty 1

## 2021-09-11 MED ORDER — MYCOPHENOLATE MOFETIL 250 MG PO CAPS
750.0000 mg | ORAL_CAPSULE | Freq: Two times a day (BID) | ORAL | Status: DC
  Administered 2021-09-11 – 2021-09-19 (×17): 750 mg via ORAL
  Filled 2021-09-11 (×20): qty 3

## 2021-09-11 MED ORDER — COLCHICINE 0.6 MG PO TABS
0.6000 mg | ORAL_TABLET | Freq: Every day | ORAL | Status: DC
  Administered 2021-09-12 – 2021-09-16 (×5): 0.6 mg via ORAL
  Filled 2021-09-11 (×5): qty 1

## 2021-09-11 MED ORDER — FLUTICASONE PROPIONATE 50 MCG/ACT NA SUSP
2.0000 | Freq: Every day | NASAL | Status: DC
  Administered 2021-09-12 – 2021-09-16 (×4): 2 via NASAL
  Filled 2021-09-11 (×2): qty 16

## 2021-09-11 MED ORDER — ATORVASTATIN CALCIUM 80 MG PO TABS
80.0000 mg | ORAL_TABLET | Freq: Every day | ORAL | Status: DC
  Administered 2021-09-11 – 2021-09-19 (×9): 80 mg via ORAL
  Filled 2021-09-11 (×3): qty 1
  Filled 2021-09-11: qty 2
  Filled 2021-09-11 (×5): qty 1

## 2021-09-11 MED ORDER — SODIUM CHLORIDE 0.9 % IV SOLN
500.0000 mg | INTRAVENOUS | Status: DC
  Administered 2021-09-12 – 2021-09-13 (×2): 500 mg via INTRAVENOUS
  Filled 2021-09-11 (×3): qty 5

## 2021-09-11 MED ORDER — SODIUM CHLORIDE 0.9 % IV SOLN
250.0000 mL | INTRAVENOUS | Status: DC | PRN
Start: 2021-09-11 — End: 2021-09-23

## 2021-09-11 MED ORDER — ONDANSETRON HCL 4 MG/2ML IJ SOLN: 4.0000 mg | Freq: Four times a day (QID) | INTRAMUSCULAR | Status: DC | PRN

## 2021-09-11 MED ORDER — ACETAMINOPHEN 650 MG RE SUPP: 650.0000 mg | Freq: Four times a day (QID) | RECTAL | Status: DC | PRN

## 2021-09-11 NOTE — Assessment & Plan Note (Signed)
Thought to be most likely secondary to combination of CHF exacerbation versus pneumonia. ? this patient has acute respiratory failure with Hypoxia  as documented by the presence of following: ?O2 saturatio< 90% on RA  ?Likely due to:  Pneumonia vs CHF exacerbation Provide O2 therapy and titrate as needed ? Continuous pulse ox ?  check Pulse ox with ambulation prior to discharge ?  may need  TC consult for home O2 set up ?  ?

## 2021-09-11 NOTE — H&P (Signed)
? ? ?[name]:[id number] DOB: October 31, 1965 DOA: 10/07/2021 ? ? ?  ?PCP: [name], MD   ?Outpatient Specialists:   ?CARDS:   Dr.Camnitz ?NEphrology: [facility] and [facility] ?  ?GI Dr.  Marland KitchenPyrtle, Lajuan Lines, MD ?  ? ?Patient arrived to ER on 09/24/2021 at 1520 ?Referred by Attending Horton, Alvin Critchley, DO ? ? ?Patient coming from:   ? home Lives With family ?  ? ?Chief Complaint:   ?Chief Complaint  ?Patient presents with  ? Shortness of Breath  ? ? ?HPI: ?Roy Dyer is a 56 y.o. male with medical history significant of atrial flutter, pleuritic chest pain due to pericarditis, ischemic cardiomyopathy, coronary artery disease with multiple stents, CKD, atrial fibrillation. ?Status post renal transplant prediabetes, lupus status post AICD avascular necrosis of the hip Barrett's esophagus on PPI ? ?Presented with worsening shortness of breath ?Worsening shortness of breath when he walks.  He has been having significant anxiety as well.  Patient is status post renal pancreatic transplant. ?Progressive shortness of breath for past few weeks sharp chest pain similar to when he had pericarditis before.  If he even walks across the room he is to feel short of breath.  Not sleeping at night but sleeping during the day.  Has not been eating as well.  No fevers no chills he has been having some morning cough and produces occasional pinkish sputum. ?Known history of CHF with EF of 35% as well as grade 2 diastolic dysfunction per echogram in March 2020 Free severe TR and severe pulmonary hypertension ?Patient is an anticoagulation with Eliquis ? Noted to be hypoxic on RA ?Reports no CP now, had a few stabbing pains but resolved ? Decreased PO intake, feels lightheaded when stands ?Occasional leg swelling ? ?No tobacco no EtOh ? Initial COVID TEST  ?NEGATIVE  ? ?Lab Results  ?Component Value Date  ? [place] NEGATIVE 09/24/2021  ? [place] NEGATIVE 02/14/2021  ? [place] NEGATIVE 02/03/2021  ? [place] NEGATIVE  04/19/2020  ? ?  ?Regarding pertinent Chronic problems:   ? Hyperlipidemia -  on statins Lipitor ?Lipid Panel  ?   ?Component Value Date/Time  ? CHOL 129 07/07/2021 0905  ? TRIG 75.0 07/07/2021 0905  ? HDL 51.70 07/07/2021 0905  ? CHOLHDL 2 07/07/2021 0905  ? VLDL 15.0 07/07/2021 0905  ? [place] 62 07/07/2021 0905  ?  ? ? chronic CHF diastolic/systolic/ combined - last echo march 2023 ?EF of 35% as well as grade 2 diastolic dysfunction per echogram in March 2020 Free severe TR and severe pulmonary hypertension ?On torsemide ?  CAD  - On Aspirin, statin,   Plavix  ?               -  followed by cardiology ?               - last cardiac cath 2001 with stents to RCA and circumflex  ?bare-metal stent to LAD in 2012  ?DES to RCA in 2018 ?  ? ?  DM 2 -  ?Lab Results  ?Component Value Date  ? HGBA1C 6.3 07/07/2021  ? farxiga ?   ?   ?OSA on CPAP ? ?Pericarditis associated with systemic lupus in 2022 ? ? A. Fib -  - CHA2DS2 vas score 4    ? current  on anticoagulation with  Eliquis,  ?  ?       -  Rate control:  Currently controlled with   ?       -  Rhythm control: amiodarone,  ? ?  ? ? CKD stage IIIb- baseline Cr 1.7 ?Estimated Creatinine Clearance: 32 mL/min (A) (by C-G formula based on SCr of 2.72 mg/dL (H)). ? ?Lab Results  ?Component Value Date  ? CREATININE 2.72 (H) 09/18/2021  ? CREATININE 1.68 (H) 07/07/2021  ? CREATININE 1.70 (H) 05/02/2021  ?  ? 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 ? ?While in ER: ?  ? ?Chest x-ray not definitive for further evidence of fluid overload versus pneumonia ? ?Ordered ?  ? ?CXR -  Chest x-ray showing stable cardiomegaly with congestive heart failure mild to moderate bilateral infrahilar atelectasis or infiltrate ?  ? ?Following Medications were ordered in ER: ?Medications  ?cefTRIAXone (ROCEPHIN) 1 g in sodium chloride 0.9 % 100 mL IVPB (has no administration in time range)  ?azithromycin (ZITHROMAX) 500 mg in sodium chloride 0.9 % 250 mL IVPB (has  no administration in time range)  ?furosemide (LASIX) injection 40 mg (40 mg Intravenous Given 09/12/2021 1955)  ?  ?_______________________________________________________ ?ER Provider Called:   Nephrology Dr. Hollie Salk ?They Recommend admit to medicine   ?One dose of lasix ? ?ER Provider Called:   Cardiology Dr.  Clayton Bibles ?They Recommend admit to medicine  echo in AM ?Cardiology see in AM    ?  ?ED Triage Vitals  ?Enc Vitals Group  ?   BP 09/12/2021 1534 118/75  ?   Pulse Rate 09/20/2021 1534 74  ?   Resp 10/07/2021 1534 15  ?   Temp 10/01/2021 1534 97.8 ?F (36.6 ?C)  ?   Temp Source 10/10/2021 1534 Oral  ?   SpO2 09/26/2021 1534 95 %  ?   Weight 10/11/2021 1959 182 lb 15.7 oz (83 kg)  ?   Height 09/25/2021 1959 '5\' 7"'$  (1.702 m)  ?   Head Circumference --   ?   Peak Flow --   ?   Pain Score 09/27/2021 1535 0  ?   Pain Loc --   ?   Pain Edu? --   ?   Excl. in [place]? --   ?NKNL(97)@    ? _________________________________________ ?Significant initial  Findings: ?Abnormal Labs Reviewed  ?CBC WITH DIFFERENTIAL/PLATELET - Abnormal; Notable for the following components:  ?    Result Value  ? WBC 17.3 (*)   ? RBC 6.05 (*)   ? MCV 79.2 (*)   ? MCH 23.0 (*)   ? MCHC 29.0 (*)   ? RDW 19.2 (*)   ? Platelets 408 (*)   ? Neutro Abs 14.9 (*)   ? Lymphs Abs 0.4 (*)   ? Monocytes Absolute 1.6 (*)   ? Abs Immature Granulocytes 0.31 (*)   ? All other components within normal limits  ?COMPREHENSIVE METABOLIC PANEL - Abnormal; Notable for the following components:  ? Sodium 133 (*)   ? Chloride 96 (*)   ? Glucose, Bld 115 (*)   ? BUN 42 (*)   ? Creatinine, Ser 2.72 (*)   ? Albumin 3.3 (*)   ? Total Bilirubin 1.6 (*)   ? GFR, Estimated 27 (*)   ? All other components within normal limits  ?BRAIN NATRIURETIC PEPTIDE - Abnormal; Notable for the following components:  ? B Natriuretic Peptide 1,553.9 (*)   ? All other components within normal limits  ?TROPONIN I (HIGH SENSITIVITY) - Abnormal; Notable for the following components:  ? Troponin I (High Sensitivity) 43 (*)    ? All other components within normal  limits  ?TROPONIN I (HIGH SENSITIVITY) - Abnormal; Notable for the following components:  ? Troponin I (High Sensitivity) 36 (*)   ? All other components within normal limits  ? ?   ?_________________________ ?Troponin 43 - 35 ?ECG: Ordered ?Personally reviewed by me showing: ?HR : 74 ?Rhythm:  LBBB,   ?  nonspecific changes  ?QTC 488 ?   ?The recent clinical data is shown below. ?Vitals:  ? 10/11/2021 2000 09/25/2021 2015 09/20/2021 2030 10/08/2021 2045  ?BP: 122/69 131/80 125/78 121/73  ?Pulse: 69 70 71 72  ?Resp: (!) 29 (!) 22 (!) 27 (!) 23  ?Temp:      ?TempSrc:      ?SpO2: 97% 95% 94% 94%  ?Weight:      ?Height:      ?  ?WBC ? ?   ?Component Value Date/Time  ? WBC 17.3 (H) 10/10/2021 1617  ? LYMPHSABS 0.4 (L) 09/12/2021 1617  ? MONOABS 1.6 (H) 09/20/2021 1617  ? EOSABS 0.0 10/08/2021 1617  ? BASOSABS 0.1 10/01/2021 1617  ? ?  ?Procalcitonin  Ordered ? ? UA  no evidence of UTI    ?  ?Urine analysis: ?   ?Component Value Date/Time  ? [place] 09/28/2021 2145  ? APPEARANCEUR CLEAR 09/30/2021 2145  ? LABSPEC 1.013 09/18/2021 2145  ? PHURINE 5.0 10/02/2021 2145  ? GLUCOSEU 150 (A) 09/20/2021 2145  ? [place] NEGATIVE 09/30/2021 2145  ? [place] NEGATIVE 09/15/2021 2145  ? [place] NEGATIVE 10/07/2021 2145  ? [place] NEGATIVE 09/17/2021 2145  ? UROBILINOGEN 0.2 07/02/2011 1132  ? NITRITE NEGATIVE 09/29/2021 2145  ? LEUKOCYTESUR NEGATIVE 10/11/2021 2145  ? ? ?Results for orders placed or performed during the hospital encounter of 09/24/2021  ?Resp Panel by RT-PCR (Flu A&B, Covid) Nasopharyngeal Swab     Status: None  ? Collection Time: 10/10/2021  7:37 PM  ? Specimen: Nasopharyngeal Swab; Nasopharyngeal(NP) swabs in vial transport medium  ?Result Value Ref Range Status  ? SARS Coronavirus 2 by RT PCR NEGATIVE NEGATIVE Final  ?      ? Influenza A by PCR NEGATIVE NEGATIVE Final  ? Influenza B by PCR NEGATIVE NEGATIVE Final  ?      ?   ? ?_______________________________________________ ?Hospitalist was called for admission for acute respiratory failure with hypoxia in the setting of possible CHF exacerbation versus pneumonia ? ? ?The following Work up has been ordered so far: ? ?Orders Pl

## 2021-09-11 NOTE — Assessment & Plan Note (Addendum)
Resume Bystolic if BP allows and cardiology agrees ?

## 2021-09-11 NOTE — ED Provider Notes (Signed)
?[facility] ?Provider Note ? ? ?CSN: [id number] ?Arrival date & time: 09/27/2021  1520 ? ?  ? ?History ? ?Chief Complaint  ?Patient presents with  ? Shortness of Breath  ? ? ?SHAWN Dyer is a 56 y.o. male. ? ?HPI ? ?56 year old male with extensive past medical history including SLE, pericarditis secondary to SLE, a flutter on Eliquis, CKD status post renal transplant, CHF, HTN presents to the emergency department with concern for episode of hypoxia.  Patient states for the past week that he has been having shortness of breath, productive cough of pink sputum.  He is also been intermittently having left-sided stabbing chest pain that he attributes similar to his previous pericarditis episodes.  Patient states he is otherwise been compliant with his medications.  Significant decrease in p.o. intake over the past couple days.  Denies any significant lower extremity swelling. ? ?Home Medications ?Prior to Admission medications   ?Medication Sig Start Date End Date Taking? Authorizing Provider  ?allopurinol (ZYLOPRIM) 100 MG tablet Take 1 tablet (100 mg total) by mouth daily. 03/22/16   McGowen, [name], MD  ?amiodarone (PACERONE) 200 MG tablet Take 0.5 tablets (100 mg total) by mouth daily. 03/28/21   Camnitz, Ocie Doyne, MD  ?apixaban (ELIQUIS) 5 MG TABS tablet Take 1 tablet (5 mg total) by mouth 2 (two) times daily. 05/17/21 05/12/22  McGowen, [name], MD  ?atorvastatin (LIPITOR) 80 MG tablet Take 80 mg by mouth at bedtime.    [provider]  ?Ca Carbonate-Mag Hydroxide (ROLAIDS PO) Take 1 tablet by mouth daily as needed (heartburn).    [provider]  ?clonazePAM (KLONOPIN) 0.5 MG tablet 1-2 tabs po qhs for insomnia 08/24/21   McGowen, [name], MD  ?clopidogrel (PLAVIX) 75 MG tablet TAKE 1 TABLET BY MOUTH DAILY 08/16/16   McGowen, [name], MD  ?colchicine 0.6 MG tablet Take 1 tablet (0.6 mg total) by mouth 2 (two) times daily. 07/07/21   McGowen, [name], MD   ?dexlansoprazole (DEXILANT) 60 MG capsule Take 1 capsule (60 mg total) by mouth daily. 02/27/21   McGowen, [name], MD  ?ENVARSUS XR 0.75 MG TB24 tablet Take 0.75 mg by mouth daily. 06/16/21   [provider]  ?famotidine (PEPCID) 20 MG tablet Take 40 mg by mouth at bedtime.     [provider]  ?FARXIGA 10 MG TABS tablet TAKE 1 TABLET DAILY BEFORE BREAKFAST 03/13/21   McGowen, [name], MD  ?fluticasone (FLONASE) 50 MCG/ACT nasal spray Place 1 spray into both nostrils daily as needed for allergies.    [provider]  ?magnesium oxide (MAG-OX) 400 MG tablet Take 400 mg by mouth daily.     [provider]  ?melatonin 3 MG TABS tablet Take 1 tablet (3 mg total) by mouth at bedtime as needed. 02/08/21   Donney Dice, DO  ?mexiletine (MEXITIL) 150 MG capsule Take 2 capsules (300 mg total) by mouth 2 (two) times daily. 09/23/20   Bensimhon, Shaune Pascal, MD  ?mycophenolate (CELLCEPT) 250 MG capsule Take 3 capsules (750 mg total) by mouth 2 (two) times daily. 12/23/16   McGowen, [name], MD  ?nebivolol (BYSTOLIC) 5 MG tablet Take 2.5 mg by mouth as needed (increased heart rate).    [provider]  ?nitroGLYCERIN (NITROSTAT) 0.4 MG SL tablet Place 1 tablet (0.4 mg total) under the tongue every 5 (five) minutes as needed for chest pain (DO NOT EXCEED 3 DOSES). ?Patient not taking: Reported on  08/24/2021 11/23/19   Josue Hector, MD  ?PARoxetine (PAXIL) 40 MG tablet Take 1 tablet (40 mg total) by mouth every evening. 08/24/21   McGowen, [name], MD  ?potassium chloride SA (KLOR-CON) 20 MEQ tablet Take 1 tablet (20 mEq total) by mouth as needed (for heart failure symptoms per Dr. Jeffie Pollock). 02/27/21 05/02/22  McGowen, [name], MD  ?predniSONE (DELTASONE) 5 MG tablet TAKE 1 TABLET BY MOUTH DAILY 10/24/16   McGowen, [name], MD  ?torsemide (DEMADEX) 20 MG tablet Take 1 tablet (20 mg total) by mouth every other day. 05/02/21   Bensimhon, Shaune Pascal, MD  ?traMADol (ULTRAM) 50 MG tablet  Take 50 mg by mouth every 6 (six) hours as needed. for pain 06/06/17   [provider]  ?   ? ?Allergies    ?Triptans   ? ?Review of Systems   ?Review of Systems  ?Constitutional:  Positive for fatigue. Negative for fever.  ?Respiratory:  Positive for cough and shortness of breath.   ?Cardiovascular:  Positive for chest pain. Negative for palpitations and leg swelling.  ?Gastrointestinal:  Negative for abdominal pain, diarrhea and vomiting.  ?Musculoskeletal:  Negative for back pain.  ?Skin:  Negative for rash.  ?Neurological:  Negative for headaches.  ? ?Physical Exam ?Updated Vital Signs ?BP 121/73   Pulse 72   Temp 97.8 ?F (36.6 ?C) (Oral)   Resp (!) 23   Ht '5\' 7"'$  (1.702 m)   Wt 83 kg   SpO2 94%   BMI 28.66 kg/m?  ?Physical Exam ?Vitals and nursing note reviewed.  ?Constitutional:   ?   General: He is not in acute distress. ?   Appearance: Normal appearance. He is ill-appearing.  ?HENT:  ?   Head: Normocephalic.  ?   Mouth/Throat:  ?   Mouth: Mucous membranes are moist.  ?Cardiovascular:  ?   Rate and Rhythm: Normal rate.  ?Pulmonary:  ?   Effort: Pulmonary effort is normal. No respiratory distress.  ?   Breath sounds: Rales present.  ?Abdominal:  ?   Palpations: Abdomen is soft.  ?   Tenderness: There is no abdominal tenderness.  ?Musculoskeletal:  ?   Right lower leg: No edema.  ?   Left lower leg: No edema.  ?Skin: ?   General: Skin is warm.  ?Neurological:  ?   Mental Status: He is alert and oriented to person, place, and time. Mental status is at baseline.  ?Psychiatric:     ?   Mood and Affect: Mood normal.  ? ? ?ED Results / Procedures / Treatments   ?Labs ?(all labs ordered are listed, but only abnormal results are displayed) ?Labs Reviewed  ?CBC WITH DIFFERENTIAL/PLATELET - Abnormal; Notable for the following components:  ?    Result Value  ? WBC 17.3 (*)   ? RBC 6.05 (*)   ? MCV 79.2 (*)   ? MCH 23.0 (*)   ? MCHC 29.0 (*)   ? RDW 19.2 (*)   ? Platelets 408 (*)   ? Neutro Abs 14.9 (*)   ?  Lymphs Abs 0.4 (*)   ? Monocytes Absolute 1.6 (*)   ? Abs Immature Granulocytes 0.31 (*)   ? All other components within normal limits  ?COMPREHENSIVE METABOLIC PANEL - Abnormal; Notable for the following components:  ? Sodium 133 (*)   ? Chloride 96 (*)   ? Glucose, Bld 115 (*)   ? BUN 42 (*)   ? Creatinine, Ser 2.72 (*)   ?  Albumin 3.3 (*)   ? Total Bilirubin 1.6 (*)   ? GFR, Estimated 27 (*)   ? All other components within normal limits  ?BRAIN NATRIURETIC PEPTIDE - Abnormal; Notable for the following components:  ? B Natriuretic Peptide 1,553.9 (*)   ? All other components within normal limits  ?TROPONIN I (HIGH SENSITIVITY) - Abnormal; Notable for the following components:  ? Troponin I (High Sensitivity) 43 (*)   ? All other components within normal limits  ?TROPONIN I (HIGH SENSITIVITY) - Abnormal; Notable for the following components:  ? Troponin I (High Sensitivity) 36 (*)   ? All other components within normal limits  ?RESP PANEL BY RT-PCR (FLU A&B, COVID) [id number]  ?SEDIMENTATION RATE  ? ? ?EKG ?EKG Interpretation ? ?Date/Time:  Monday Sep 11 2021 15:28:43 EDT ?Ventricular Rate:  74 ?PR Interval:  255 ?QRS Duration: 145 ?QT Interval:  439 ?QTC Calculation: 488 ?R Axis:   -83 ?Text Interpretation: Sinus rhythm Ventricular premature complex Prolonged PR interval Probable left atrial enlargement Nonspecific IVCD with LAD Left ventricular hypertrophy Probable inferior infarct, acute Anterior infarct, old Lateral leads are also involved >>> Acute MI <<< LBBB old Confirmed by Lavenia Atlas [id number]) on 10/03/2021 3:32:36 PM ? ?Radiology ?DG Chest 2 View ? ?Result Date: 10/02/2021 ?CLINICAL DATA:  Shortness of breath. EXAM: CHEST - 2 VIEW COMPARISON:  February 14, 2021 FINDINGS: A single lead ventricular pacer is seen. Mild diffusely increased interstitial lung markings are seen. There is mild to moderate severity bilateral infrahilar atelectasis and/or infiltrate. There is no evidence of a pleural effusion or  pneumothorax. Mild bilateral perihilar pulmonary vascular prominence is seen. The cardiac silhouette is markedly enlarged and unchanged in size. The visualized skeletal structures are unremarkable. IMPRESSION: 1. Stable card

## 2021-09-11 NOTE — Subjective & Objective (Signed)
Worsening shortness of breath when he walks.  He has been having significant anxiety as well.  Patient is status post renal pancreatic transplant. ?Progressive shortness of breath for past few weeks sharp chest pain similar to when he had pericarditis before.  If he even walks across the room he is to feel short of breath.  Not sleeping at night but sleeping during the day.  Has not been eating as well.  No fevers no chills he has been having some morning cough and produces occasional pinkish sputum. ?Known history of CHF with EF of 35% as well as grade 2 diastolic dysfunction per echogram in March 2020 Free severe TR and severe pulmonary hypertension ?Patient is an anticoagulation with Eliquis ?

## 2021-09-11 NOTE — Assessment & Plan Note (Signed)
Chronic continue Lipitor ?

## 2021-09-11 NOTE — Assessment & Plan Note (Signed)
Continue CellCept tacrolimus and prednisone ?Appreciate nephrology involvement. ?Continue to follow renal function ?Obtain renal ultrasound ?

## 2021-09-11 NOTE — Assessment & Plan Note (Signed)
Continue PPI ?

## 2021-09-11 NOTE — Assessment & Plan Note (Signed)
Status post renal transplant ? -chronic avoid nephrotoxic medications such as NSAIDs, Vanco Zosyn combo,  avoid hypotension, continue to follow renal function ?Nephrology is aware agree to 1 dose of Lasix and continue to follow ?

## 2021-09-11 NOTE — Assessment & Plan Note (Signed)
-   Pt diagnosed with CHF based on presence of the following  OA, rales on exam,   cardiomegaly, Pulmonary edema on CXR, DOE,   ?  ?admit on telemetry,  ?cycle cardiac enzymes, 43 - 37 ?  ?obtain serial ECG  to evaluate for ischemia as a cause of heart failure ? monitor daily weight:  ?Danley Danker Weights  ? 10/06/2021 1959  ?Weight: 83 kg  ? ?Last BNP BNP (last 3 results) ?Recent Labs  ?  02/14/21 ?2032 05/02/21 ?1234 09/16/2021 ?1617  ?BNP 1,742.3* 1,152.1* 1,553.9*  ? ?  ? diurese with IV lasix and monitor orthostatics and creatinine to avoid over diuresis. ? Order echogram to evaluate EF and valves ? ACE/ARBi  Contraindicated* ?  cardiology consulted  ?Discussed with cardiology and nephrology and we will 1 dose of Lasix today and reassess how he does to see able to put out fluid and what his kidney function does ?

## 2021-09-11 NOTE — ED Triage Notes (Signed)
Pt here via [facility] from PCP office for shortness of breath x2 weeks but got worse in the last few days. At PCP, pt RA SpO2 was in the 60s, placed pt on 2L O2 via nasal cannula and it increased to 94%. Pt reports having random stabbing chest pain over the last few weeks but hasn't happened within the last week. Hx chf and pericarditis. 118/78, 72HR. Pt now denies shob at rest, only with exertion ?

## 2021-09-11 NOTE — ED Notes (Signed)
Patient transported to X-ray 

## 2021-09-11 NOTE — Assessment & Plan Note (Signed)
Order CPAP. 

## 2021-09-11 NOTE — Assessment & Plan Note (Signed)
Chronic sed rate unremarkable check C3-C4 ?

## 2021-09-11 NOTE — Assessment & Plan Note (Signed)
Continue Lipitor restart beta-blocker when able ?

## 2021-09-11 NOTE — Assessment & Plan Note (Signed)
Chronic stable continue PPI followed by [facility] ?

## 2021-09-11 NOTE — Assessment & Plan Note (Signed)
Cycle cardiac enzymes ?Echo in the morning ?No evidence of pericarditis on EKG patient already on colchicine.  Cardiology is aware ?

## 2021-09-11 NOTE — Assessment & Plan Note (Signed)
- -  Patient presenting with  productive cough,    hypoxi  and infiltrate in  on chest x-ray ?-Infiltrate on CXR and 2-3 characteristics (fever, leukocytosis, purulent sputum) are consistent with pneumonia. ?-This appears to be most likely community-acquired pneumonia.  ?  ? will admit for treatment of CAP will start on appropriate antibiotic coverage. - Rocephin/azithromycin ?  Obtain:  sputum cultures, ?                 Obtain respiratory panel  ?                 influenza serologies negative ?                 COVID PCR negative   ?                 blood cultures and sputum cultures ordered  ?                 strep pneumo UA antigen,  ?                  check for Legionella antigen. ?               Provide oxygen as needed.  ? ? ?

## 2021-09-11 NOTE — Progress Notes (Signed)
OFFICE VISIT ? ?10/09/2021 ? ?CC:  ?Chief Complaint  ?Patient presents with  ? Shortness of Breath  ? ? ?Patient is a 56 y.o. male who presents accompanied by Roy Dyer for 2-week follow-up insomnia.  Additionally he is noting increased shortness of breath with walking. ?A/P as of last visit: ?"#1 GAD, improving on Paxil 40 mg a day. ?Would continue to expect gradual improvement at least over the next 1 to 2 months. ?New prescription today. ?  ?#2 insomnia. ?Does not sound like he has restless legs. ?We will do trial of clonazepam 0.5 mg, 1-2 nightly, #60, refill x1. ?Therapeutic expectations and side effect profile of medication discussed today.  Patient's questions answered." ? ?INTERIM HX: ?Roy Dyer has been getting short of breath worse and worse over the last couple weeks.  It started with a episode of left-sided chest pain that waxed and waned for a couple days.  He says it was sharp and tended to be position dependent somewhat, the same as when he had pericarditis.  After couple days it went away but Roy shortness of breath has continued.  If he just walks across the room he feels winded.  When asked about palpitations he says "nothing unusual for me".  He has been sleeping more during the day,, although this increased daytime sleepiness could be a result of having started clonazepam for Roy insomnia a couple weeks ago. ?He has not noted any weight gain and or fluid accumulation in the abdomen or legs. ?Significantly decreased p.o. intake the last several days.  No fever, no nausea or vomiting, no diarrhea. ?He has had a morning cough for the last year that produces some pinkish sputum and then does not bother him the rest of the day. ? ?No recent new medications other than the clonazepam I prescribed him. ?He does take Demadex 20 mg every other day. ? ?Of note:  ?Repeat echo 07/31/2021 showed EF 44%, grade 2 diastolic dysfunction, moderate MR, severe TR and severe pulmonary hypertension.   ?Roy ejection  fraction was unchanged.  He was told that we will discuss upgrade to CRT at next follow-up. ?Roy next scheduled appointment with Dr. Curt Bears in electrophysiology is 01/17/2022. ? ?ROS as above, plus--> no wheezing,no dizziness, no HAs, no rashes, no melena/hematochezia.  No polyuria or polydipsia.  No myalgias or arthralgias.  No focal weakness, paresthesias, or tremors.  No acute vision or hearing abnormalities.  No dysuria or unusual/new urinary urgency or frequency.  No recent changes in lower legs. ?No n/v/d or abd pain.  No palpitations.   ? ?Past Medical History:  ?Diagnosis Date  ? AICD (automatic cardioverter/defibrillator) present   ? Dr. Paschal Dopp follows remotely-yrly checks, Dr. Nishan,cardiology  ? Atrial flutter ([place]) 01/2021  ? dx'd when in hosp for pericarditis->amiodarone+eliquis  ? Avascular necrosis of bone of hip ([place]) 2010 surg  ? Left hip arthroplasty: from chronic systemic steroids taken for Roy Lupus  ? Barrett's esophagus 2017; 2021  ? Blood transfusion without reported diagnosis 2010, 2012  ? CAD (coronary artery disease)   ? **DAPT long term recommended by cards**.  a. stents RCA/Circ 2001 b. BMS to LAD 07/2010  c. 01/2017: s/p DES to RCA.   ? Cataract   ? left eye  ? CHF (congestive heart failure) ([place])   ? Chronic headaches 02/2017  ? As of 01/2017, HA's no better, neurologist ordered CT.  ESR normal at that time.  HA'S COMPLETELY RESOLVED AFTER HE GOT ON CPAP 2019/2020.  ? Chronic  renal insufficiency, stage 3 (moderate) (HCC)   ? GFR 55-60 (sCr 1.4-1.7 range)  ? Erythrocytosis   ? Pre and post transplant->on losartan for this  ? GERD (gastroesophageal reflux disease)   ? Hx of esoph stricture and dilatation.  +Barrett's esophagus+hx of aspiration pneumonia-->to get rpt EGD when he comes off DAPT (utd as of 01/29/18)  ? Gout   ? s/p renal transplant he was weened off of Roy allopurinol.  ? Hiatal hernia   ? History of adenomatous polyp of colon 09/2019  ? polyp x 1. Recall 5 yrs  ? History of end  stage renal disease 04/24/2011  ? Secondary to SLE: HD 06/2011-10/2012 (then got renal transplant)  ? History of renal transplant 11/10/2012  ? 10/2012 [facility]   ? Hyperlipidemia   ? Hypertension   ? implantable cardiac defibrillator single chamber   ? Medtronic (due to low EF)  ? Ischemic cardiomyopathy   ? Chronic systolic dysfunction--  06/1113 EF 30-35%.   Single chamber ICD 11/20/10 ([name])  ? LBBB (left bundle branch block) 2023  ? Left ventricular thrombus 2012  ? Re-eval 02/2011 showed thrombus RESOLVED, so coumadin was d/c'd (was on it for 4mo  ? Lupus ([place]   ? OSA on CPAP 2018  ? Dr. [name]9/2018: +OSA on home sleep studay; CPAP auto titrate 5-20 started 05/2017.  ? Osteoporosis   ? Pericarditis associated with systemic lupus erythematosus ([place] 01/2021  ? hosp->colchicine  ? Prediabetes 12/2019  ? A1c stable long term at 5.6-5.7 until jump up to 6.2% Aug 2021.  ? ? ?Past Surgical History:  ?Procedure Laterality Date  ? AV FISTULA PLACEMENT  03/28/2011  ? Procedure: ARTERIOVENOUS (AV) FISTULA CREATION;  Surgeon: TRosetta Posner MD;  [place]  Service: Vascular;  Laterality: Left;  Creation of Left radiocephallic cimino fistula  ? AV FISTULA PLACEMENT  04/27/2011  ? Procedure: ARTERIOVENOUS (AV) FISTULA CREATION;  Surgeon: TRosetta Posner MD;  [place]  Service: Vascular;  Laterality: Left;  left basilic vein transposition  ? BIOPSY  09/14/2019  ? Procedure: BIOPSY;  Surgeon: PJerene Bears MD;  [facility];  Service: Gastroenterology;;  ? CARDIAC CATHETERIZATION  08/2010; 04/22/20  ? 08/2010; 04/2020->mild/mod in-stent restenosis but none hemodynamically signif, severe LV dysfxn, frequenct PVCs-->referred to HF clinic for optimization of med mgmt  ? CARDIOVASCULAR STRESS TEST  07/2010; 03/2014  ? 03/2014 showed large old infarct and EF 30-35% but no ischemia  ? COLONOSCOPY  09/14/2019  ? Adenoma x 1.  Recall 5 yrs  ? COLONOSCOPY WITH PROPOFOL N/A 09/14/2019  ? Procedure: COLONOSCOPY WITH  PROPOFOL;  Surgeon: PJerene Bears MD;  [facility];  Service: Gastroenterology;  Laterality: N/A;  ? CORONARY PRESSURE WIRE/FFR WITH 3D MAPPING N/A 04/22/2020  ? Procedure: Coronary Pressure Wire/FFR w/3D Mapping;  Surgeon: AWellington Hampshire MD;  [facility];  Service: Cardiovascular;  Laterality: N/A;  ? DEXA  07/05/2017  ? Normal bone density in radius and spine.  ? ESOPHAGOGASTRODUODENOSCOPY  02/2009  ? Done by Dr. SFuller Planfor hematemesis; esophagitis found.  Repeat recommended 09/2016, at which time he will also likely get Roy initial screening colonoscopy.  ? ESOPHAGOGASTRODUODENOSCOPY  09/14/2019  ? esophagitis (improved compared to 2017 EGD), Barrett's.  Stomach and duodenum normal.  ? ESOPHAGOGASTRODUODENOSCOPY (EGD) WITH PROPOFOL N/A 09/29/2015  ? REPEAT EGD RECOMMENDED 09/2016.  Barrett's esoph + mild chronic gastritis.  H pylori NEG. Procedure: ESOPHAGOGASTRODUODENOSCOPY (EGD) WITH PROPOFOL;  Surgeon:  Jerene Bears, MD;  [facility];  Service: Gastroenterology;  Laterality: N/A;  ? ESOPHAGOGASTRODUODENOSCOPY (EGD) WITH PROPOFOL N/A 09/14/2019  ? Procedure: ESOPHAGOGASTRODUODENOSCOPY (EGD) WITH PROPOFOL;  Surgeon: Jerene Bears, MD;  [facility];  Service: Gastroenterology;  Laterality: N/A;  ? ICD GENERATOR CHANGEOUT N/A 04/11/2018  ? Procedure: ICD GENERATOR CHANGEOUT;  Surgeon: Deboraha Sprang, MD;  Location: [place] [facility];  Service: Cardiovascular;  Laterality: N/A;  ? INSERT / REPLACE / REMOVE PACEMAKER    ? medtronic        dr [name]    [place]   icd only  ? KIDNEY TRANSPLANT  10/2012  ? [place] nephrologist--Dr. Blair Heys  ? LEFT HEART CATH AND CORONARY ANGIOGRAPHY N/A 02/08/2017  ? PCA and DES to mRCA--needs DAPT for at least 6 mo.   Procedure: LEFT HEART CATH AND CORONARY ANGIOGRAPHY;  Surgeon: Sherren Mocha, MD;  Location: [place] [facility];  Service: Cardiovascular;  Laterality: N/A;  ? LEFT HEART CATH AND CORONARY ANGIOGRAPHY N/A  04/22/2020  ? Procedure: LEFT HEART CATH AND CORONARY ANGIOGRAPHY;  Surgeon: Wellington Hampshire, MD;  Location: [place] [facility];  Service: Cardiovascular;  Laterality: N/A;  ? PARTIAL HIP ARTHROPLASTY Left

## 2021-09-11 NOTE — Assessment & Plan Note (Signed)
Versus atrial fibrillation.  Continue amiodarone continue Eliquis beta-blocker appreciate cardiology input ?

## 2021-09-11 NOTE — Assessment & Plan Note (Signed)
Nephrology is aware patient is status post renal transplant continue ?Immunosuppressive's.  Nephrology recommended 1 dose of Lasix and monitor renal function thereafter.  Obtain urine electrolytes ?

## 2021-09-11 NOTE — Assessment & Plan Note (Signed)
Patient is on Mexitil we will continue monitor on telemetry ?

## 2021-09-11 NOTE — Assessment & Plan Note (Signed)
Chronic-stable.

## 2021-09-12 ENCOUNTER — Inpatient Hospital Stay (HOSPITAL_COMMUNITY): Payer: Managed Care, Other (non HMO)

## 2021-09-12 ENCOUNTER — Other Ambulatory Visit: Payer: Self-pay

## 2021-09-12 ENCOUNTER — Other Ambulatory Visit (HOSPITAL_COMMUNITY): Payer: Managed Care, Other (non HMO)

## 2021-09-12 DIAGNOSIS — N179 Acute kidney failure, unspecified: Secondary | ICD-10-CM | POA: Diagnosis not present

## 2021-09-12 DIAGNOSIS — I5023 Acute on chronic systolic (congestive) heart failure: Secondary | ICD-10-CM | POA: Diagnosis not present

## 2021-09-12 DIAGNOSIS — R778 Other specified abnormalities of plasma proteins: Secondary | ICD-10-CM | POA: Diagnosis not present

## 2021-09-12 DIAGNOSIS — J9601 Acute respiratory failure with hypoxia: Secondary | ICD-10-CM | POA: Diagnosis not present

## 2021-09-12 DIAGNOSIS — I5043 Acute on chronic combined systolic (congestive) and diastolic (congestive) heart failure: Secondary | ICD-10-CM | POA: Diagnosis not present

## 2021-09-12 LAB — PROCALCITONIN
Procalcitonin: 1.16 ng/mL
Procalcitonin: 1.23 ng/mL

## 2021-09-12 LAB — COMPREHENSIVE METABOLIC PANEL
ALT: 26 U/L (ref 0–44)
AST: 29 U/L (ref 15–41)
Albumin: 2.6 g/dL — ABNORMAL LOW (ref 3.5–5.0)
Alkaline Phosphatase: 107 U/L (ref 38–126)
Anion gap: 13 (ref 5–15)
BUN: 43 mg/dL — ABNORMAL HIGH (ref 6–20)
CO2: 23 mmol/L (ref 22–32)
Calcium: 8.4 mg/dL — ABNORMAL LOW (ref 8.9–10.3)
Chloride: 95 mmol/L — ABNORMAL LOW (ref 98–111)
Creatinine, Ser: 2.73 mg/dL — ABNORMAL HIGH (ref 0.61–1.24)
GFR, Estimated: 27 mL/min — ABNORMAL LOW (ref >60)
Glucose, Bld: 106 mg/dL — ABNORMAL HIGH (ref 70–99)
Potassium: 4.4 mmol/L (ref 3.5–5.1)
Sodium: 131 mmol/L — ABNORMAL LOW (ref 135–145)
Total Bilirubin: 1.4 mg/dL — ABNORMAL HIGH (ref 0.3–1.2)
Total Protein: 5.2 g/dL — ABNORMAL LOW (ref 6.5–8.1)

## 2021-09-12 LAB — ECHOCARDIOGRAM COMPLETE
AR max vel: 2.6 cm2
AV Peak grad: 9.6 mmHg
Ao pk vel: 1.55 m/s
Area-P 1/2: 5.42 cm2
Calc EF: 32.7 %
Height: 67 in
S' Lateral: 5.7 cm
Single Plane A2C EF: 30 %
Single Plane A4C EF: 32.3 %
Weight: 2927.71 oz

## 2021-09-12 LAB — PHOSPHORUS: Phosphorus: 5 mg/dL — ABNORMAL HIGH (ref 2.5–4.6)

## 2021-09-12 LAB — CBC
HCT: 39.8 % (ref 39.0–52.0)
Hemoglobin: 12.3 g/dL — ABNORMAL LOW (ref 13.0–17.0)
MCH: 24.1 pg — ABNORMAL LOW (ref 26.0–34.0)
MCHC: 30.9 g/dL (ref 30.0–36.0)
MCV: 78 fL — ABNORMAL LOW (ref 80.0–100.0)
Platelets: 330 10*3/uL (ref 150–400)
RBC: 5.1 MIL/uL (ref 4.22–5.81)
RDW: 17.9 % — ABNORMAL HIGH (ref 11.5–15.5)
WBC: 17.7 10*3/uL — ABNORMAL HIGH (ref 4.0–10.5)
nRBC: 0 % (ref 0.0–0.2)

## 2021-09-12 LAB — TSH: TSH: 4.881 u[IU]/mL — ABNORMAL HIGH (ref 0.350–4.500)

## 2021-09-12 LAB — CREATININE, URINE, RANDOM: Creatinine, Urine: 101.92 mg/dL

## 2021-09-12 LAB — PREALBUMIN: Prealbumin: 6.1 mg/dL — ABNORMAL LOW (ref 18–38)

## 2021-09-12 LAB — SODIUM, URINE, RANDOM: Sodium, Ur: 28 mmol/L

## 2021-09-12 LAB — MAGNESIUM: Magnesium: 1.8 mg/dL (ref 1.7–2.4)

## 2021-09-12 LAB — TROPONIN I (HIGH SENSITIVITY): Troponin I (High Sensitivity): 35 ng/L — ABNORMAL HIGH (ref <18)

## 2021-09-12 MED ORDER — SODIUM CHLORIDE 0.9 % IV SOLN
INTRAVENOUS | Status: DC
Start: 2021-09-13 — End: 2021-09-13

## 2021-09-12 MED ORDER — FUROSEMIDE 10 MG/ML IJ SOLN
80.0000 mg | Freq: Once | INTRAMUSCULAR | Status: AC
  Administered 2021-09-12: 80 mg via INTRAVENOUS
  Filled 2021-09-12: qty 8

## 2021-09-12 MED ORDER — SODIUM CHLORIDE 0.9 % IV SOLN: 250.0000 mL | INTRAVENOUS | Status: DC | PRN

## 2021-09-12 MED ORDER — SODIUM CHLORIDE 0.9% FLUSH
3.0000 mL | Freq: Two times a day (BID) | INTRAVENOUS | Status: DC
Start: 2021-09-12 — End: 2021-09-23
  Administered 2021-09-12 – 2021-09-22 (×6): 3 mL via INTRAVENOUS

## 2021-09-12 MED ORDER — SODIUM CHLORIDE 0.9% FLUSH: 3.0000 mL | INTRAVENOUS | Status: DC | PRN

## 2021-09-12 MED ORDER — ASPIRIN 81 MG PO CHEW
81.0000 mg | CHEWABLE_TABLET | ORAL | Status: AC
  Administered 2021-09-13: 81 mg via ORAL
  Filled 2021-09-12: qty 1

## 2021-09-12 MED ORDER — BOOST / RESOURCE BREEZE PO LIQD CUSTOM
1.0000 | Freq: Three times a day (TID) | ORAL | Status: DC
  Administered 2021-09-13: 1 via ORAL

## 2021-09-12 MED ORDER — PERFLUTREN LIPID MICROSPHERE
1.0000 mL | INTRAVENOUS | Status: AC | PRN
  Administered 2021-09-12: 2 mL via INTRAVENOUS
  Filled 2021-09-12: qty 10

## 2021-09-12 NOTE — ED Notes (Signed)
Placed breakfast order ?

## 2021-09-12 NOTE — ED Notes (Signed)
Instructed patient to bring tacrolimus due to pharm not stocking it ?

## 2021-09-12 NOTE — Plan of Care (Signed)
?  Problem: Activity: ?Goal: Ability to tolerate increased activity will improve ?09/12/2021 1656 by Su Hoff, RN ?Outcome: Progressing ?09/12/2021 1655 by Su Hoff, RN ?Outcome: Progressing ?  ?Problem: Respiratory: ?Goal: Ability to maintain adequate ventilation will improve ?09/12/2021 1656 by Su Hoff, RN ?Outcome: Progressing ?09/12/2021 1655 by Su Hoff, RN ?Outcome: Progressing ?  ?Problem: Respiratory: ?Goal: Ability to maintain a clear airway will improve ?09/12/2021 1656 by Su Hoff, RN ?Outcome: Progressing ?09/12/2021 1655 by Su Hoff, RN ?Outcome: Progressing ?  ?Problem: Activity: ?Goal: Risk for activity intolerance will decrease ?Outcome: Progressing ?  ?Problem: Pain Managment: ?Goal: General experience of comfort will improve ?Outcome: Progressing ?  ?Problem: Safety: ?Goal: Ability to remain free from injury will improve ?Outcome: Progressing ?  ?

## 2021-09-12 NOTE — Progress Notes (Signed)
Received patient from ED via stretcher.  Patient is able to ambulate from the stretcher to bed.  Assisted in position of comfort in bed.  Oriented to room and unit routine.  Noted to be short of breath on exertion, denies chest pain, able to participate with care and do things for himself at this time.  Needs addressed. ?

## 2021-09-12 NOTE — Consult Note (Signed)
[facility] ?Nephrology Consultation Note ? ?Reason for Consult: AKI on CKD, s/p renal transplant ?Referring Physician: Barb Merino, MD ? ?Roy Dyer is a 56 y.o. male with past medical history significant for CKD stage IIIb, lupus s/p renal transplant, CAD s/p multiple stents, ischemic cardiomyopathy w/most recent EF 35% s/p AICD, HTN, a-flutter, and prediabetes who presented to the ED with worsening shortness of breath and is admitted for acute hypoxic respiratory failure secondary to CHF exacerbation vs pneumonia. Patient was also found to have AKI with creatinine 2.76 on admission. ? ?Patient follows with [facility] transplant team and was last seen Dec 2022. Baseline creatinine around 1.5-1.7 although Cr in Dec 2022 was 2.0. Also follows with nephrology outpatient, Dr Clover Mealy. ? ?He endorses several days to weeks of worsening shortness of breath as well as productive cough. Reports he has not been eating or drinking well over the past few weeks due to overall lack of appetite. No GI losses. Endorses excellent compliance with his medications and no recent med changes.  ?He denies significant leg swelling or abdominal fullness. Weighs himself daily and has not noted any recent change in weight. Urinating at his normal baseline. ? ?In the ED patient received 23m IV Lasix x1 and subsequently had 400 cc urine output. ? ? ?Past Medical History:  ?Diagnosis Date  ? AICD (automatic cardioverter/defibrillator) present   ? [name] remotely-yrly checks, Dr. Nishan,cardiology  ? Atrial flutter ([place] 01/2021  ? dx'd when in hosp for pericarditis->amiodarone+eliquis  ? [place] bone of [place] 2010 surg  ? Left hip arthroplasty: from chronic systemic steroids taken for his Lupus  ? Barrett's esophagus 2017; 2021  ? Blood transfusion without reported diagnosis 2010, 2012  ? CAD (coronary artery disease)   ? **DAPT long term recommended by cards**.  a. stents RCA/Circ 2001 b. BMS to  LAD 07/2010  c. 01/2017: s/p DES to RCA.   ? Cataract   ? left eye  ? CHF (congestive heart failure) ([place]   ? Chronic headaches 02/2017  ? As of 01/2017, HA's no better, neurologist ordered CT.  ESR normal at that time.  HA'S COMPLETELY RESOLVED AFTER HE GOT ON CPAP 2019/2020.  ? Chronic renal insufficiency, stage 3 (moderate) (HCC)   ? GFR 55-60 (sCr 1.4-1.7 range)  ? Erythrocytosis   ? Pre and post transplant->on losartan for this  ? GERD (gastroesophageal reflux disease)   ? Hx of esoph stricture and dilatation.  +Barrett's esophagus+hx of aspiration pneumonia-->to get rpt EGD when he comes off DAPT (utd as of 01/29/18)  ? Gout   ? s/p renal transplant he was weened off of his allopurinol.  ? Hiatal hernia   ? History of adenomatous polyp of colon 09/2019  ? polyp x 1. Recall 5 yrs  ? 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 Secondary to SLE: HD 06/2011-10/2012 (then got renal transplant)  ? History of renal transplant 11/10/2012  ? [facility]   ? Hyperlipidemia   ? Hypertension   ? implantable cardiac defibrillator single chamber   ? Medtronic (due to low EF)  ? Ischemic cardiomyopathy   ? Chronic systolic dysfunction--  142/6834EF 30-35%.   Single chamber ICD 11/20/10 (Dr. KCaryl Comes  ? LBBB (left bundle branch block) 2023  ? Left ventricular thrombus 2012  ? Re-eval 02/2011 showed thrombus RESOLVED, so coumadin was d/c'd (was on it for 631mo ? Lupus ([place]  ? OSA on CPAP 2018  ?  Dr. Annamaria Boots 01/2017: +OSA on home sleep studay; CPAP auto titrate 5-20 started 05/2017.  ? Osteoporosis   ? Pericarditis associated with systemic lupus erythematosus ([place]) 01/2021  ? hosp->colchicine  ? Prediabetes 12/2019  ? A1c stable long term at 5.6-5.7 until jump up to 6.2% Aug 2021.  ? ? ?Past Surgical History:  ?Procedure Laterality Date  ? AV FISTULA PLACEMENT  03/28/2011  ? Procedure: ARTERIOVENOUS (AV) FISTULA CREATION;  Surgeon: Rosetta Posner, MD;  Location: [place];  Service: Vascular;  Laterality: Left;  Creation of Left  radiocephallic cimino fistula  ? AV FISTULA PLACEMENT  04/27/2011  ? Procedure: ARTERIOVENOUS (AV) FISTULA CREATION;  Surgeon: Rosetta Posner, MD;  Location: [place];  Service: Vascular;  Laterality: Left;  left basilic vein transposition  ? BIOPSY  09/14/2019  ? Procedure: BIOPSY;  Surgeon: Jerene Bears, MD;  [facility];  Service: Gastroenterology;;  ? CARDIAC CATHETERIZATION  08/2010; 04/22/20  ? 08/2010; 04/2020->mild/mod in-stent restenosis but none hemodynamically signif, severe LV dysfxn, frequenct PVCs-->referred to HF clinic for optimization of med mgmt  ? CARDIOVASCULAR STRESS TEST  07/2010; 03/2014  ? 03/2014 showed large old infarct and EF 30-35% but no ischemia  ? COLONOSCOPY  09/14/2019  ? Adenoma x 1.  Recall 5 yrs  ? COLONOSCOPY WITH PROPOFOL N/A 09/14/2019  ? Procedure: COLONOSCOPY WITH PROPOFOL;  Surgeon: Jerene Bears, MD;  [facility];  Service: Gastroenterology;  Laterality: N/A;  ? CORONARY PRESSURE WIRE/FFR WITH 3D MAPPING N/A 04/22/2020  ? Procedure: Coronary Pressure Wire/FFR w/3D Mapping;  Surgeon: Wellington Hampshire, MD;  Location: [place] [facility];  Service: Cardiovascular;  Laterality: N/A;  ? DEXA  07/05/2017  ? Normal bone density in radius and spine.  ? ESOPHAGOGASTRODUODENOSCOPY  02/2009  ? Done by Dr. Fuller Plan for hematemesis; esophagitis found.  Repeat recommended 09/2016, at which time he will also likely get his initial screening colonoscopy.  ? ESOPHAGOGASTRODUODENOSCOPY  09/14/2019  ? esophagitis (improved compared to 2017 EGD), Barrett's.  Stomach and duodenum normal.  ? ESOPHAGOGASTRODUODENOSCOPY (EGD) WITH PROPOFOL N/A 09/29/2015  ? REPEAT EGD RECOMMENDED 09/2016.  Barrett's esoph + mild chronic gastritis.  H pylori NEG. Procedure: ESOPHAGOGASTRODUODENOSCOPY (EGD) WITH PROPOFOL;  Surgeon: Jerene Bears, MD;  [facility];  Service: Gastroenterology;  Laterality: N/A;  ? ESOPHAGOGASTRODUODENOSCOPY (EGD) WITH PROPOFOL N/A 09/14/2019  ? Procedure:  ESOPHAGOGASTRODUODENOSCOPY (EGD) WITH PROPOFOL;  Surgeon: Jerene Bears, MD;  [facility];  Service: Gastroenterology;  Laterality: N/A;  ? ICD GENERATOR CHANGEOUT N/A 04/11/2018  ? Procedure: ICD GENERATOR CHANGEOUT;  Surgeon: Deboraha Sprang, MD;  Location: [place] [facility];  Service: Cardiovascular;  Laterality: N/A;  ? INSERT / REPLACE / REMOVE PACEMAKER    ? medtronic        dr [name]    [place]   icd only  ? KIDNEY TRANSPLANT  10/2012  ? [place] nephrologist--Dr. Blair Heys  ? LEFT HEART CATH AND CORONARY ANGIOGRAPHY N/A 02/08/2017  ? PCA and DES to mRCA--needs DAPT for at least 6 mo.   Procedure: LEFT HEART CATH AND CORONARY ANGIOGRAPHY;  Surgeon: Sherren Mocha, MD;  Location: [place] [facility];  Service: Cardiovascular;  Laterality: N/A;  ? LEFT HEART CATH AND CORONARY ANGIOGRAPHY N/A 04/22/2020  ? Procedure: LEFT HEART CATH AND CORONARY ANGIOGRAPHY;  Surgeon: Wellington Hampshire, MD;  Location: [place] [facility];  Service: Cardiovascular;  Laterality: N/A;  ? PARTIAL HIP ARTHROPLASTY Left 2010  ? left  ? POLYPECTOMY  09/14/2019  ? Procedure: POLYPECTOMY;  Surgeon: Jerene Bears, MD;  [facility];  Service: Gastroenterology;;  ? RENAL BIOPSY    ? stents    ? 05-2010 and 2- in 2010 and 1 in 2018.  ? TOTAL HIP ARTHROPLASTY  07/09/2011  ? Procedure: TOTAL HIP ARTHROPLASTY;  Surgeon: Kerin Salen, MD;  Location: [place];  Service: Orthopedics;  Laterality: Right;  ? TRANSTHORACIC ECHOCARDIOGRAM  10/13/2020  ? 10/2020 EF 30-35% (slight improved), grd III DD, elev PA pressure, mold/mod tricusp regurg.  01/2021 and 07/2021 echo essentially unchanged.  ? TYMPANOSTOMY TUBE PLACEMENT  56 yrs old  ? US ECHOCARDIOGRAPHY  12/2010; 02/2014;08/2014;08/2019  ? 2015 EF still 25-30%, inc'd  PA press.  2016 EF 30-35%, sept/inf hypokin, mod tricusp regurg. 08/2019 EF 25-30%, global hypok, sev dil LV w/apical aneur, grd III DD, mod tricus reg, mod pulm htn  ? ZIO PATCH  04/2020  ? HIGH PVC BURDEN.  NSVT, SVT,  BBB.  ? ? ?Family History  ?Problem Relation Age of Onset  ? Kidney failure Mother   ? Lupus Mother   ? Stroke Mother   ? Diabetes Mother   ?     type 2  ? Heart attack Father   ?     X 7  ? Hypertension Father   ? Hyper

## 2021-09-12 NOTE — Plan of Care (Signed)
  Problem: Activity: Goal: Ability to tolerate increased activity will improve Outcome: Progressing   Problem: Clinical Measurements: Goal: Ability to maintain a body temperature in the normal range will improve Outcome: Progressing   Problem: Respiratory: Goal: Ability to maintain adequate ventilation will improve Outcome: Progressing Goal: Ability to maintain a clear airway will improve Outcome: Progressing   

## 2021-09-12 NOTE — ED Notes (Signed)
Tacrolimus med bottle from patient delivered to pharmacy ?

## 2021-09-12 NOTE — Progress Notes (Signed)
?PROGRESS NOTE ? ? ? ?Roy Dyer  KKX:[id number] DOB: 18-Jun-1965 DOA: 09/16/2021 ?PCP: [name], MD  ? ? ?Brief Narrative:  ?56 year old gentleman with history of CKD stage IIIb, lupus status post renal transplant, coronary artery disease status post multiple stents, ischemic cardiomyopathy, AICD, hypertension, a flutter and prediabetes admitted with worsening shortness of breath, found to have AKI as well as fluid overload.  Baseline creatinine about 1.5-1.7.  Hypoxemic on arrival.  Chest x-ray with cardiovascular prominence.  Admitted with nephrology and cardiology consultations. ? ? ?Assessment & Plan: ?  ?Acute respiratory failure with hypoxemia: Presented with shortness of breath, 89% on room air at rest and 81% on mobility with respiratory distress.  Multifactorial. ? ?Acute on chronic biventricular heart failure: With known ischemic cardiomyopathy and ejection fraction 35%. ?Currently remains on IV Lasix 80 mg twice daily, continue intake and output monitoring.  He cannot tolerate other heart failure therapy given renal failure and low blood pressures.  Repeat echocardiogram today.  Concern about recent pericarditis, however currently no evidence of it. ?Also has severe tricuspid regurgitation with dilated RV.  Heart failure team following. ? ?Pulmonary hypertension: Severely elevated RVSP, severe TR.  On CPAP at night. ? ?AKI on CKD stage IIIb, status post renal transplant in 2012.  Baseline creatinine reported 1.4-2.  Holding [place]. ?On immunosuppressive therapy with prednisone, Prograf and CellCept.  Intake output monitoring.  Discussed with nephrology for consultation. ? ?Community-acquired pneumonia: Less likely.  Reasonable to continue antibiotics for 48 hours pending cultures.  He has enough other conditions to cause elevated white count and abnormal chest x-ray. ? ?Coronary artery disease: As above.  Remains on Eliquis along with Plavix and a statin.  Troponins flat.  No evidence of  ACS. ? ?Paroxysmal A-fib and history of PVCs: In normal sinus rhythm.  On amiodarone.  Therapeutic on Eliquis. ? ? ?DVT prophylaxis:  ?apixaban (ELIQUIS) tablet 5 mg  ? ?Code Status: Full code ?Family Communication: None ?Disposition Plan: Status is: Inpatient ?Remains inpatient appropriate because: Complicated heart failure, transplant patient with AKI. ?  ? ? ?Consultants:  ?Nephrology ?Cardiology ? ?Procedures:  ?None ? ?Antimicrobials:  ?Rocephin and azithromycin 5/1--- ? ? ?Subjective: ?Patient seen and examined.  He was in the emergency room.  When I examined him, he had gone to the bathroom in the hallway and came back with shortness of breath and oxygen saturation 82%.  On 2 L oxygen at rest he felt better.  Currently denies any chest pain. ? ?Objective: ?Vitals:  ? 09/12/21 1015 09/12/21 1115 09/12/21 1145 09/12/21 1315  ?BP: 114/71 119/73 108/67 116/72  ?Pulse: 66 69 68 66  ?Resp: 19 (!) 32 (!) 23 (!) 34  ?Temp:      ?TempSrc:      ?SpO2: 94% 92% 94% 94%  ?Weight:      ?Height:      ? ? ?Intake/Output Summary (Last 24 hours) at 09/12/2021 1415 ?Last data filed at 10/09/2021 2239 ?Gross per 24 hour  ?Intake --  ?Output 400 ml  ?Net -400 ml  ? ?Filed Weights  ? 09/23/2021 1959  ?Weight: 83 kg  ? ? ?Examination: ? ?General exam: Appears calm and comfortable  ?Mildly anxious with distress when walking in the hallway without oxygen. ?Respiratory system: No added sounds. ?Cardiovascular system: S1 & S2 heard, RRR.  AICD in place. ?Gastrointestinal system: Abdomen is nondistended, soft and nontender. No organomegaly or masses felt. Normal bowel sounds heard. ?Central nervous system: Alert and oriented. No focal neurological  deficits. ?Extremities: Symmetric 5 x 5 power. ? ? ?Data Reviewed: I have personally reviewed following labs and imaging studies ? ?CBC: ?Recent Labs  ?Lab 09/20/2021 ?1617 09/15/2021 ?2224 09/12/21 ?0408  ?WBC 17.3*  --  17.7*  ?NEUTROABS 14.9*  --   --   ?HGB 13.9 14.3 12.3*  ?HCT 47.9 42.0 39.8  ?MCV  79.2*  --  78.0*  ?PLT 408*  --  330  ? ?Basic Metabolic Panel: ?Recent Labs  ?Lab 10/02/2021 ?1617 09/24/2021 ?2202 09/23/2021 ?2224 09/12/21 ?0408  ?NA 133*  --  132* 131*  ?K 4.1  --  3.9 4.4  ?CL 96*  --   --  95*  ?CO2 22  --   --  23  ?GLUCOSE 115*  --   --  106*  ?BUN 42*  --   --  43*  ?CREATININE 2.72*  --   --  2.73*  ?CALCIUM 9.2  --   --  8.4*  ?MG  --  1.8  --  1.8  ?PHOS  --  4.8*  --  5.0*  ? ?GFR: ?Estimated Creatinine Clearance: 31.9 mL/min (A) (by C-G formula based on SCr of 2.73 mg/dL (H)). ?Liver Function Tests: ?Recent Labs  ?Lab 10/04/2021 ?1617 09/18/2021 ?2202 09/12/21 ?0408  ?AST 36 28 29  ?ALT 32 26 26  ?ALKPHOS 123 99 107  ?BILITOT 1.6* 1.4* 1.4*  ?PROT 6.5 5.5* 5.2*  ?ALBUMIN 3.3* 2.6* 2.6*  ? ?No results for input(s): LIPASE, AMYLASE in the last 168 hours. ?No results for input(s): AMMONIA in the last 168 hours. ?Coagulation Profile: ?No results for input(s): INR, PROTIME in the last 168 hours. ?Cardiac Enzymes: ?Recent Labs  ?Lab 10/08/2021 ?2202  ?CKTOTAL 37*  ? ?BNP (last 3 results) ?No results for input(s): PROBNP in the last 8760 hours. ?HbA1C: ?No results for input(s): HGBA1C in the last 72 hours. ?CBG: ?No results for input(s): GLUCAP in the last 168 hours. ?Lipid Profile: ?No results for input(s): CHOL, HDL, LDLCALC, TRIG, CHOLHDL, LDLDIRECT in the last 72 hours. ?Thyroid Function Tests: ?Recent Labs  ?  09/12/21 ?0408  ?TSH 4.881*  ? ?Anemia Panel: ?No results for input(s): VITAMINB12, FOLATE, FERRITIN, TIBC, IRON, RETICCTPCT in the last 72 hours. ?Sepsis Labs: ?Recent Labs  ?Lab 09/20/2021 ?2202 09/12/21 ?0408  ?PROCALCITON 1.16 1.23  ? ? ?Recent Results (from the past 240 hour(s))  ?Resp Panel by RT-PCR (Flu A&B, Covid) Nasopharyngeal Swab     Status: None  ? Collection Time: 09/14/2021  7:37 PM  ? Specimen: Nasopharyngeal Swab; Nasopharyngeal(NP) swabs in vial transport medium  ?Result Value Ref Range Status  ? SARS Coronavirus 2 by RT PCR NEGATIVE NEGATIVE Final  ?  Comment:  (NOTE) ?SARS-CoV-2 target nucleic acids are NOT DETECTED. ? ?The SARS-CoV-2 RNA is generally detectable in upper respiratory ?specimens during the acute phase of infection. The lowest ?concentration of SARS-CoV-2 viral copies this assay can detect is ?138 copies/mL. A negative result does not preclude SARS-Cov-2 ?infection and should not be used as the sole basis for treatment or ?other patient management decisions. A negative result may occur with  ?improper specimen collection/handling, submission of specimen other ?than nasopharyngeal swab, presence of viral mutation(s) within the ?areas targeted by this assay, and inadequate number of viral ?copies(<138 copies/mL). A negative result must be combined with ?clinical observations, patient history, and epidemiological ?information. The expected result is Negative. ? ?Fact Sheet for Patients:  ?EntrepreneurPulse.com.au ? ?Fact Sheet for Healthcare Providers:  ?IncredibleEmployment.be ? ?This test is no  t yet approved or cleared by the [place] FDA and  ?has been authorized for detection and/or diagnosis of SARS-CoV-2 by ?FDA under an Emergency Use Authorization (EUA). This EUA will remain  ?in effect (meaning this test can be used) for the duration of the ?COVID-19 declaration under Section 564(b)(1) of the Act, 21 ?U.S.C.section 360bbb-3(b)(1), unless the authorization is terminated  ?or revoked sooner.  ? ? ?  ? Influenza A by PCR NEGATIVE NEGATIVE Final  ? Influenza B by PCR NEGATIVE NEGATIVE Final  ?  Comment: (NOTE) ?The Xpert Xpress SARS-CoV-2/FLU/RSV plus assay is intended as an aid ?in the diagnosis of influenza from Nasopharyngeal swab specimens and ?should not be used as a sole basis for treatment. Nasal washings and ?aspirates are unacceptable for Xpert Xpress SARS-CoV-2/FLU/RSV ?testing. ? ?Fact Sheet for Patients: ?EntrepreneurPulse.com.au ? ?Fact Sheet for Healthcare  Providers: ?IncredibleEmployment.be ? ?This test is not yet approved or cleared by the [place] FDA and ?has been authorized for detection and/or diagnosis of SARS-CoV-2 by ?FDA under an Emergency Use Authorization (EUA). This E

## 2021-09-12 NOTE — ED Notes (Signed)
Patient ambulated to bathroom without oxygen and sats were 79% and he wac tachypneic.  Patient recovered quickly when placed back on 2L [place] ?

## 2021-09-12 NOTE — Progress Notes (Signed)
?  Echocardiogram ?2D Echocardiogram has been performed. ?Pt did have back pain after using definity. However did not have a reaction when he has had it in the past two uses. Discussed with Dr. Sallyanne Kuster.  ? ?Roy Dyer ?09/12/2021, 3:30 PM ?

## 2021-09-12 NOTE — Consult Note (Addendum)
?  ?Advanced Heart Failure Team Consult Note ? ? ?Primary Physician: [name], MD ?PCP-Cardiologist:  [name], MD ?[facility]: Dr. Haroldine Laws  ? ?Reason for Consultation: acute on chronic systolic heart failure  ? ?HPI:   ? ?Roy Dyer is seen today for evaluation of acute on chronic systolic heart failure at the request of Dr. Sloan Leiter, Internal Medicine.  ? ?Roy Dyer is a 56 y.o. male with a severe premature CAD, SLE s/p renal transplant 2012 ([facility]), OSA on CPAP and systolic HF EF 96% ?  ?His first MI was in 2001 with stenting to the RCA and OM. Had BMS to LAD in 2012. Catheterization 9/18 showed 90% lesion in mRCA -> DES, LAD and OM stents patent.  ?  ?Echo12/17 EF 30% ?  ?Echo 4/21 EF 25- 30% with severe LAE with mild MR and moderate TR.  ?  ?Cath 12/21 for worsening HF symptoms. Stable CAD. With frequent PVCs. No RHC ?  ?Zio 12/21: PVCs 13.4% ?  ?Echo 6/22 EF 30-35% Apex akinetic. G3 DD RV ok Personally reviewed ?  ?Follow up 8/22 low-dose spiro restarted and repeat Zio placed to follow PVC burden. Zio showed 11.8% PVCs of one morphology. Placed on PO amio and mexiletine.  ?  ?Admit 9/22 for new atrial flutter and acute pericarditis, converted to NSR w/ amio and treated w/ colchicine. Echo no effusion, EF 30-35%, RV normal. ?  ?Seen in ED 10/22 w/ recurrent, symptomatic atrial flutter w/ RVR HR 150s. He converted to SR with IV amio. EP consulted and discussed possibility of AFL ablation after GDMT optimized and he is off colchicine; weight 203 lbs.  ? ?Repeat Zio on amio and Mexilitine showed decreased PVC burden, down to 4%.  ? ?Saw Dr. Curt Bears last month for possible AF/AFL ablation. Dr. Curt Bears wanted to defer due to LA size. He repeated echo which showed severe LAE, 5.8 cm, LVEF 35%, RV moderately reduced w/ severely elevated RVSP 68 mmHg. Only mild-mod MR, Severe TR.   ? ?Presents to ED w/ complaints of progressive dyspnea. Present for the last 3-4 weeks and gradually worsened, NYHA Class  IIIb. Reports full compliance w/ diuretics. Admits to some dietary indiscretion w/ sodium. Denies CP. Some recent palpitations but currently in NSR.  ? ?BNP elevated, 1,553. CXR w/ cardiomegaly and mild b/l perihilar pulmonary vascular prominence, mild to moderate severity bilateral infrahilar atelectasis and/or infiltrate. WBC elevated at 17K but AF. + cough w/ scant pink tinged sputum. Respiratory panel negative. Primary team treating for possible CAP.  On Azithromycin and Rocephin. AHF team consulted for CHF management.   ? ?Also w/ AKI on CKD. SCr elevated at 2.72. prior BL ~1.7. AST/ALT normal. Tbili elevated at 1.4. Na 131, K 4.4, CO2 23.   ? ?He reports ~45 lb wt loss in the last severely months due to anorexia and decreased PO intake. Increased fatigue.  ? ?On chronic amio. ESR normal at 5 mm/hr. ? ?Echo 07/31/21 ?left ventricular ejection fraction by 3D volume is 35 %. The left ventricle has ?moderately decreased function. The left ventricle demonstrates global hypokinesis. ?The left ventricular internal cavity size was severely dilated. Left ventricular diastolic ?parameters are consistent with Grade II diastolic dysfunction (pseudonormalization). ?1. ?Right ventricular systolic function is moderately reduced. The right ventricular size is ?normal. There is severely elevated pulmonary artery systolic pressure. ?2. ?3. Left atrial size was severely dilated. ?4. Right atrial size was severely dilated. ?The mitral valve is normal in structure. Mild to moderate  mitral valve regurgitation. No ?evidence of mitral stenosis. ?5. ?6. Tricuspid valve regurgitation is severe. ?The aortic valve is normal in structure. Aortic valve regurgitation is mild. No aortic ?stenosis is present. ?7. ?The inferior vena cava is normal in size with greater than 50% respiratory variability, ?suggesting right atrial pressure of 3 mmHg. ? ? ?Review of Systems: [y] = yes, [ ]  = no  ? ?General: Weight gain [ ] ; Weight loss [Y ]; Anorexia  [ Y]; Fatigue [ Y]; Fever [ ] ; Chills [ ] ; Weakness [ Y]  ?Cardiac: Chest pain/pressure [ ] ; Resting SOB [ ] ; Exertional SOB [ Y]; Orthopnea [ ] ; Pedal Edema [ ] ; Palpitations [ ] ; Syncope [ ] ; Presyncope [ ] ; Paroxysmal nocturnal dyspnea[ ]   ?Pulmonary: Cough [ Y]; Wheezing[ ] ; Hemoptysis[ ] ; Sputum [Y ]; Snoring [Y ]  ?GI: Vomiting[ ] ; Dysphagia[ ] ; Melena[ ] ; Hematochezia [ ] ; Heartburn[ ] ; Abdominal pain [ ] ; Constipation [ ] ; Diarrhea [ ] ; BRBPR [ ]   ?GU: Hematuria[ ] ; Dysuria [ ] ; Nocturia[ ]   ?Vascular: Pain in legs with walking [ ] ; Pain in feet with lying flat [ ] ; Non-healing sores [ ] ; Stroke [ ] ; TIA [ ] ; Slurred speech [ ] ;  ?Neuro: Headaches[ ] ; Vertigo[ ] ; Seizures[ ] ; Paresthesias[ ] ;Blurred vision [ ] ; Diplopia [ ] ; Vision changes [ ]   ?Ortho/Skin: Arthritis [ ] ; Joint pain [ ] ; Muscle pain [ ] ; Joint swelling [ ] ; Back Pain [ ] ; Rash [ ]   ?Psych: Depression[ ] ; Anxiety[ ]   ?Heme: Bleeding problems [ ] ; Clotting disorders [ ] ; Anemia [ ]   ?Endocrine: Diabetes [ ] ; Thyroid dysfunction[ ]  ? ?Home Medications ?Prior to Admission medications   ?Medication Sig Start Date End Date Taking? Authorizing Provider  ?acetaminophen (TYLENOL) 500 MG tablet Take 1,000 mg by mouth every 6 (six) hours as needed for moderate pain or headache.   Yes [provider]  ?allopurinol (ZYLOPRIM) 100 MG tablet Take 1 tablet (100 mg total) by mouth daily. 03/22/16  Yes McGowen, [name], MD  ?amiodarone (PACERONE) 200 MG tablet Take 0.5 tablets (100 mg total) by mouth daily. ?Patient taking differently: Take 100 mg by mouth every evening. 03/28/21  Yes Camnitz, Ocie Doyne, MD  ?apixaban (ELIQUIS) 5 MG TABS tablet Take 1 tablet (5 mg total) by mouth 2 (two) times daily. 05/17/21 05/12/22 Yes McGowen, [name], MD  ?atorvastatin (LIPITOR) 80 MG tablet Take 80 mg by mouth at bedtime.   Yes [provider]  ?Ca Carbonate-Mag Hydroxide (ROLAIDS PO) Take 1 tablet by mouth daily as needed (heartburn).   Yes [provider]  ?clonazePAM (KLONOPIN) 0.5 MG tablet 1-2 tabs po qhs for insomnia ?Patient taking differently: Take 0.5 mg by mouth at bedtime. 08/24/21  Yes McGowen, [name], MD  ?clopidogrel (PLAVIX) 75 MG tablet TAKE 1 TABLET BY MOUTH DAILY ?Patient taking differently: Take 75 mg by mouth daily. 08/16/16  Yes McGowen, [name], MD  ?colchicine 0.6 MG tablet Take 1 tablet (0.6 mg total) by mouth 2 (two) times daily. ?Patient taking differently: Take 0.6 mg by mouth daily. 07/07/21  Yes McGowen, [name], MD  ?dexlansoprazole (DEXILANT) 60 MG capsule Take 1 capsule (60 mg total) by mouth daily. 02/27/21  Yes McGowen, [name], MD  ?ENVARSUS XR 0.75 MG TB24 tablet Take 0.75 mg by mouth daily. 06/16/21  Yes [provider]  ?famotidine (PEPCID) 20 MG tablet Take 40 mg by mouth at bedtime.    Yes [provider]  ?Wilder Glade 10  MG TABS tablet TAKE 1 TABLET DAILY BEFORE BREAKFAST ?Patient taking differently: Take 10 mg by mouth daily. 03/13/21  Yes McGowen, [name], MD  ?fluticasone (FLONASE) 50 MCG/ACT nasal spray Place 1 spray into both nostrils daily.   Yes [provider]  ?magnesium oxide (MAG-OX) 400 MG tablet Take 400 mg by mouth daily.    Yes [provider]  ?mexiletine (MEXITIL) 150 MG capsule Take 2 capsules (300 mg total) by mouth 2 (two) times daily. 09/23/20  Yes Bensimhon, Shaune Pascal, MD  ?mycophenolate (CELLCEPT) 250 MG capsule Take 3 capsules (750 mg total) by mouth 2 (two) times daily. 12/23/16  Yes McGowen, [name], MD  ?nebivolol (BYSTOLIC) 5 MG tablet Take 2.5 mg by mouth daily.   Yes [provider]  ?nitroGLYCERIN (NITROSTAT) 0.4 MG SL tablet Place 1 tablet (0.4 mg total) under the tongue every 5 (five) minutes as needed for chest pain (DO NOT EXCEED 3 DOSES). 11/23/19  Yes Josue Hector, MD  ?NON FORMULARY CPAP at bedtime   Yes [provider]  ?PARoxetine (PAXIL) 40 MG tablet Take 1 tablet (40 mg total) by mouth every evening. 08/24/21  Yes McGowen,  [name], MD  ?potassium chloride SA (KLOR-CON) 20 MEQ tablet Take 1 tablet (20 mEq total) by mouth as needed (for heart failure symptoms per Dr. Jeffie Pollock). ?Patient taking differently: Take 20 mEq by m

## 2021-09-12 NOTE — Addendum Note (Signed)
Addended by: [name] on: 09/12/2021 02:08 PM ? ? Modules accepted: Level of Service ? ?

## 2021-09-13 ENCOUNTER — Encounter (HOSPITAL_COMMUNITY): Admission: EM | Disposition: E | Payer: Self-pay | Source: Home / Self Care | Attending: Internal Medicine

## 2021-09-13 DIAGNOSIS — I5043 Acute on chronic combined systolic (congestive) and diastolic (congestive) heart failure: Secondary | ICD-10-CM | POA: Diagnosis not present

## 2021-09-13 DIAGNOSIS — N189 Chronic kidney disease, unspecified: Secondary | ICD-10-CM

## 2021-09-13 DIAGNOSIS — I5082 Biventricular heart failure: Secondary | ICD-10-CM | POA: Diagnosis not present

## 2021-09-13 DIAGNOSIS — J189 Pneumonia, unspecified organism: Secondary | ICD-10-CM | POA: Diagnosis not present

## 2021-09-13 DIAGNOSIS — I272 Pulmonary hypertension, unspecified: Secondary | ICD-10-CM

## 2021-09-13 DIAGNOSIS — J9601 Acute respiratory failure with hypoxia: Secondary | ICD-10-CM | POA: Diagnosis not present

## 2021-09-13 DIAGNOSIS — E440 Moderate protein-calorie malnutrition: Secondary | ICD-10-CM | POA: Diagnosis present

## 2021-09-13 DIAGNOSIS — I5023 Acute on chronic systolic (congestive) heart failure: Secondary | ICD-10-CM | POA: Diagnosis not present

## 2021-09-13 DIAGNOSIS — N179 Acute kidney failure, unspecified: Secondary | ICD-10-CM | POA: Diagnosis not present

## 2021-09-13 DIAGNOSIS — I10 Essential (primary) hypertension: Secondary | ICD-10-CM | POA: Diagnosis not present

## 2021-09-13 HISTORY — PX: RIGHT HEART CATH: CATH118263

## 2021-09-13 LAB — COMPREHENSIVE METABOLIC PANEL
ALT: 20 U/L (ref 0–44)
AST: 26 U/L (ref 15–41)
Albumin: 2.3 g/dL — ABNORMAL LOW (ref 3.5–5.0)
Alkaline Phosphatase: 92 U/L (ref 38–126)
Anion gap: 13 (ref 5–15)
BUN: 43 mg/dL — ABNORMAL HIGH (ref 6–20)
CO2: 24 mmol/L (ref 22–32)
Calcium: 8.3 mg/dL — ABNORMAL LOW (ref 8.9–10.3)
Chloride: 98 mmol/L (ref 98–111)
Creatinine, Ser: 2.4 mg/dL — ABNORMAL HIGH (ref 0.61–1.24)
GFR, Estimated: 31 mL/min — ABNORMAL LOW (ref >60)
Glucose, Bld: 106 mg/dL — ABNORMAL HIGH (ref 70–99)
Potassium: 3.8 mmol/L (ref 3.5–5.1)
Sodium: 135 mmol/L (ref 135–145)
Total Bilirubin: 1.3 mg/dL — ABNORMAL HIGH (ref 0.3–1.2)
Total Protein: 4.7 g/dL — ABNORMAL LOW (ref 6.5–8.1)

## 2021-09-13 LAB — POCT I-STAT EG7
Acid-base deficit: 1 mmol/L (ref 0.0–2.0)
Acid-base deficit: 2 mmol/L (ref 0.0–2.0)
Acid-base deficit: 2 mmol/L (ref 0.0–2.0)
Bicarbonate: 24.6 mmol/L (ref 20.0–28.0)
Bicarbonate: 22.8 mmol/L (ref 20.0–28.0)
Bicarbonate: 23 mmol/L (ref 20.0–28.0)
Calcium, Ion: 1.16 mmol/L (ref 1.15–1.40)
Calcium, Ion: 1.03 mmol/L — ABNORMAL LOW (ref 1.15–1.40)
Calcium, Ion: 1.09 mmol/L — ABNORMAL LOW (ref 1.15–1.40)
HCT: 43 % (ref 39.0–52.0)
HCT: 41 % (ref 39.0–52.0)
HCT: 42 % (ref 39.0–52.0)
Hemoglobin: 14.6 g/dL (ref 13.0–17.0)
Hemoglobin: 13.9 g/dL (ref 13.0–17.0)
Hemoglobin: 14.3 g/dL (ref 13.0–17.0)
O2 Saturation: 71 %
O2 Saturation: 77 %
O2 Saturation: 86 %
Potassium: 3.6 mmol/L (ref 3.5–5.1)
Potassium: 3.3 mmol/L — ABNORMAL LOW (ref 3.5–5.1)
Potassium: 3.4 mmol/L — ABNORMAL LOW (ref 3.5–5.1)
Sodium: 136 mmol/L (ref 135–145)
Sodium: 137 mmol/L (ref 135–145)
Sodium: 138 mmol/L (ref 135–145)
TCO2: 26 mmol/L (ref 22–32)
TCO2: 24 mmol/L (ref 22–32)
TCO2: 24 mmol/L (ref 22–32)
pCO2, Ven: 43.1 mmHg — ABNORMAL LOW (ref 44–60)
pCO2, Ven: 38.8 mmHg — ABNORMAL LOW (ref 44–60)
pCO2, Ven: 39.5 mmHg — ABNORMAL LOW (ref 44–60)
pH, Ven: 7.365 (ref 7.25–7.43)
pH, Ven: 7.369 (ref 7.25–7.43)
pH, Ven: 7.382 (ref 7.25–7.43)
pO2, Ven: 38 mmHg (ref 32–45)
pO2, Ven: 42 mmHg (ref 32–45)
pO2, Ven: 53 mmHg — ABNORMAL HIGH (ref 32–45)

## 2021-09-13 LAB — LEGIONELLA PNEUMOPHILA SEROGP 1 UR AG: L. pneumophila Serogp 1 Ur Ag: NEGATIVE

## 2021-09-13 LAB — C4 COMPLEMENT: Complement C4, Body Fluid: 22 mg/dL (ref 12–38)

## 2021-09-13 LAB — C-REACTIVE PROTEIN: CRP: 30.8 mg/dL — ABNORMAL HIGH (ref <1.0)

## 2021-09-13 LAB — PROCALCITONIN: Procalcitonin: 1.16 ng/mL

## 2021-09-13 LAB — C3 COMPLEMENT: C3 Complement: 122 mg/dL (ref 82–167)

## 2021-09-13 SURGERY — RIGHT HEART CATH
Anesthesia: LOCAL

## 2021-09-13 MED ORDER — HYDRALAZINE HCL 20 MG/ML IJ SOLN: 10.0000 mg | INTRAMUSCULAR | Status: AC | PRN

## 2021-09-13 MED ORDER — SODIUM CHLORIDE 0.9% FLUSH
3.0000 mL | Freq: Two times a day (BID) | INTRAVENOUS | Status: DC
  Administered 2021-09-13 – 2021-09-22 (×9): 3 mL via INTRAVENOUS

## 2021-09-13 MED ORDER — MIDAZOLAM HCL 2 MG/2ML IJ SOLN
INTRAMUSCULAR | Status: AC
  Filled 2021-09-13: qty 2

## 2021-09-13 MED ORDER — ENSURE ENLIVE PO LIQD
237.0000 mL | Freq: Three times a day (TID) | ORAL | Status: DC
  Administered 2021-09-13 – 2021-09-17 (×4): 237 mL via ORAL

## 2021-09-13 MED ORDER — ADULT MULTIVITAMIN W/MINERALS CH
1.0000 | ORAL_TABLET | Freq: Every day | ORAL | Status: DC
  Administered 2021-09-13 – 2021-09-19 (×7): 1 via ORAL
  Filled 2021-09-13 (×7): qty 1

## 2021-09-13 MED ORDER — POTASSIUM CHLORIDE CRYS ER 20 MEQ PO TBCR
40.0000 meq | EXTENDED_RELEASE_TABLET | Freq: Once | ORAL | Status: AC
  Administered 2021-09-13: 40 meq via ORAL
  Filled 2021-09-13: qty 2

## 2021-09-13 MED ORDER — UREA 10 % EX LOTN
TOPICAL_LOTION | Freq: Two times a day (BID) | CUTANEOUS | Status: DC
  Administered 2021-09-17 – 2021-09-19 (×2): 1 via TOPICAL
  Filled 2021-09-13: qty 237

## 2021-09-13 MED ORDER — ONDANSETRON HCL 4 MG/2ML IJ SOLN
4.0000 mg | Freq: Four times a day (QID) | INTRAMUSCULAR | Status: DC | PRN
  Administered 2021-09-14 – 2021-09-18 (×2): 4 mg via INTRAVENOUS
  Filled 2021-09-13 (×2): qty 2

## 2021-09-13 MED ORDER — FENTANYL CITRATE (PF) 100 MCG/2ML IJ SOLN
INTRAMUSCULAR | Status: AC
  Filled 2021-09-13: qty 2

## 2021-09-13 MED ORDER — FENTANYL CITRATE (PF) 100 MCG/2ML IJ SOLN
INTRAMUSCULAR | Status: DC | PRN
  Administered 2021-09-13: 25 ug via INTRAVENOUS

## 2021-09-13 MED ORDER — FUROSEMIDE 10 MG/ML IJ SOLN
80.0000 mg | Freq: Once | INTRAMUSCULAR | Status: AC
  Administered 2021-09-13: 80 mg via INTRAVENOUS
  Filled 2021-09-13: qty 8

## 2021-09-13 MED ORDER — LABETALOL HCL 5 MG/ML IV SOLN: 10.0000 mg | INTRAVENOUS | Status: AC | PRN

## 2021-09-13 MED ORDER — SODIUM CHLORIDE 0.9% FLUSH: 3.0000 mL | INTRAVENOUS | Status: DC | PRN

## 2021-09-13 MED ORDER — MIDAZOLAM HCL 2 MG/2ML IJ SOLN
INTRAMUSCULAR | Status: DC | PRN
Start: 2021-09-13 — End: 2021-09-13
  Administered 2021-09-13: 1 mg via INTRAVENOUS

## 2021-09-13 MED ORDER — APIXABAN 5 MG PO TABS
5.0000 mg | ORAL_TABLET | Freq: Two times a day (BID) | ORAL | Status: DC
  Administered 2021-09-13 – 2021-09-16 (×7): 5 mg via ORAL
  Filled 2021-09-13 (×7): qty 1

## 2021-09-13 MED ORDER — ACETAMINOPHEN 325 MG PO TABS: 650.0000 mg | ORAL_TABLET | ORAL | Status: DC | PRN

## 2021-09-13 MED ORDER — HEPARIN (PORCINE) IN NACL 1000-0.9 UT/500ML-% IV SOLN
INTRAVENOUS | Status: AC
  Filled 2021-09-13: qty 500

## 2021-09-13 MED ORDER — HEPARIN (PORCINE) IN NACL 1000-0.9 UT/500ML-% IV SOLN
INTRAVENOUS | Status: DC | PRN
  Administered 2021-09-13: 500 mL

## 2021-09-13 MED ORDER — LIDOCAINE HCL (PF) 1 % IJ SOLN
INTRAMUSCULAR | Status: DC | PRN
  Administered 2021-09-13: 10 mL via INTRADERMAL

## 2021-09-13 MED ORDER — SODIUM CHLORIDE 0.9 % IV SOLN
250.0000 mL | INTRAVENOUS | Status: DC | PRN
  Administered 2021-09-15: 250 mL via INTRAVENOUS

## 2021-09-13 MED ORDER — LIDOCAINE HCL (PF) 1 % IJ SOLN
INTRAMUSCULAR | Status: AC
  Filled 2021-09-13: qty 30

## 2021-09-13 SURGICAL SUPPLY — 5 items
CATH SWAN GANZ 7F STRAIGHT (CATHETERS) ×1 IMPLANT
KIT MICROPUNCTURE NIT STIFF (SHEATH) ×1 IMPLANT
PACK CARDIAC CATHETERIZATION (CUSTOM PROCEDURE TRAY) ×2 IMPLANT
SHEATH PINNACLE 7F 10CM (SHEATH) ×1 IMPLANT
TRANSDUCER W/STOPCOCK (MISCELLANEOUS) ×2 IMPLANT

## 2021-09-13 NOTE — Assessment & Plan Note (Signed)
Stable with no acute flare.  ?

## 2021-09-13 NOTE — Assessment & Plan Note (Signed)
Continue antiacid therapy  ?

## 2021-09-13 NOTE — Assessment & Plan Note (Signed)
Non sustained ventricular tachycardia,  ? ?Continue with amiodarone and anticoagulation with apixaban.  ?

## 2021-09-13 NOTE — Assessment & Plan Note (Signed)
Continue medical therapy. ?Patient with no chest pain.  ?

## 2021-09-13 NOTE — Assessment & Plan Note (Addendum)
Chest radiograph with cardiomegaly and bilateral interstitial infiltrates, more confluent right lower lobe. ? ?Follow up chest film with persistent pulmonary edema.   ?Elevated procalcitonin  ? ?Patient completed 5 days of antibiotic therapy ?Wbc is improving.  ? ? ?

## 2021-09-13 NOTE — Assessment & Plan Note (Signed)
Continue with statin therapy.  ?

## 2021-09-13 NOTE — Progress Notes (Addendum)
? ? Advanced Heart Failure Rounding Note ? ?PCP-Cardiologist: [name], MD  ? ?Subjective:   ? ?5/2: Admitted w/ a/c CHF,  AKI on CKD and ? CAP.  ? ?Echo: EF 30-35% (stable), severe BAE, RV mod reduced, mod TR, Mod Pericardial Effusion w/o tamponade, IVC dilated, RVSP severely elevated 75 mmHg  ? ?ESR normal @ 5. CRP pending.   ? ?Strict I/Os not recorded yesterday but Wt trending down w/ IV Lasix, down 4 lb.  ? ?Scr trending down, 2.73>>2.40. Tacrolimus level pending  ?K 3.8  ? ?On rocephin and azithromycin for PNA. WBC 17K. PCT 1.16>>1.23>>1.16. Strep pneumo negative. Legionella pending  ? ?Feels ok currently. No current resting dyspnea. Had pleuritic CP ~3 wks ago but not currently. BP norotensive. No syncope/ near syncope.  ? ?Scheduled for RHC this afternoon.  ? ? ?Objective:   ?Weight Range: ?81.1 kg ?Body mass index is 27.99 kg/m?.  ? ?Vital Signs:   ?Temp:  [97.6 ?F (36.4 ?C)-98 ?F (36.7 ?C)] 97.7 ?F (36.5 ?C) (05/03 9470) ?Pulse Rate:  [66-84] 72 (05/03 0909) ?Resp:  [18-34] 20 (05/03 0809) ?BP: (108-127)/(67-79) 127/77 (05/03 9628) ?SpO2:  [85 %-97 %] 85 % (05/03 0909) ?Weight:  [81.1 kg] 81.1 kg (05/03 0549) ?Last BM Date : 09/12/21 ? ?Weight change: ?Filed Weights  ? 09/30/2021 1959 09/26/2021 0549  ?Weight: 83 kg 81.1 kg  ? ? ?Intake/Output:  ? ?Intake/Output Summary (Last 24 hours) at 09/29/2021 1136 ?Last data filed at 10/08/2021 0400 ?Gross per 24 hour  ?Intake 450 ml  ?Output 700 ml  ?Net -250 ml  ?  ? ? ?Physical Exam  ?  ?General:  Well appearing. No resp difficulty ?HEENT: Normal ?Neck: Supple. JVP 10 cm . Carotids 2+ bilat; no bruits. No lymphadenopathy or thyromegaly appreciated. ?Cor: PMI nondisplaced. Regular rate & rhythm.  + Friction rub LSB, 2/6 TR murmur  ?Lungs: decreased BS at the bases bilaterally w/ LLL crackles  ?Abdomen: Soft, nontender, nondistended. No hepatosplenomegaly. No bruits or masses. Good bowel sounds. ?Extremities: No cyanosis, clubbing, rash, edema ?Neuro: Alert &  orientedx3, cranial nerves grossly intact. moves all 4 extremities w/o difficulty. Affect pleasant ? ? ?Telemetry  ? ?NSR 77 bpm  ? ?EKG  ?  ?No new EKG to review  ? ?Labs  ?  ?CBC ?Recent Labs  ?  09/18/2021 ?1617 09/25/2021 ?2224 09/12/21 ?0408  ?WBC 17.3*  --  17.7*  ?NEUTROABS 14.9*  --   --   ?HGB 13.9 14.3 12.3*  ?HCT 47.9 42.0 39.8  ?MCV 79.2*  --  78.0*  ?PLT 408*  --  330  ? ?Basic Metabolic Panel ?Recent Labs  ?  10/02/2021 ?2202 09/27/2021 ?2224 09/12/21 ?0408 10/08/2021 ?3662  ?NA  --    < > 131* 135  ?K  --    < > 4.4 3.8  ?CL  --   --  95* 98  ?CO2  --   --  23 24  ?GLUCOSE  --   --  106* 106*  ?BUN  --   --  43* 43*  ?CREATININE  --   --  2.73* 2.40*  ?CALCIUM  --   --  8.4* 8.3*  ?MG 1.8  --  1.8  --   ?PHOS 4.8*  --  5.0*  --   ? < > = values in this interval not displayed.  ? ?Liver Function Tests ?Recent Labs  ?  09/12/21 ?0408 09/16/2021 ?9476  ?AST 29 26  ?ALT 26 20  ?  ALKPHOS 107 92  ?BILITOT 1.4* 1.3*  ?PROT 5.2* 4.7*  ?ALBUMIN 2.6* 2.3*  ? ?No results for input(s): LIPASE, AMYLASE in the last 72 hours. ?Cardiac Enzymes ?Recent Labs  ?  10/11/2021 ?2202  ?CKTOTAL 37*  ? ? ?BNP: ?BNP (last 3 results) ?Recent Labs  ?  02/14/21 ?2032 05/02/21 ?1234 10/03/2021 ?1617  ?BNP 1,742.3* 1,152.1* 1,553.9*  ? ? ?ProBNP (last 3 results) ?No results for input(s): PROBNP in the last 8760 hours. ? ? ?D-Dimer ?No results for input(s): DDIMER in the last 72 hours. ?Hemoglobin A1C ?No results for input(s): HGBA1C in the last 72 hours. ?Fasting Lipid Panel ?No results for input(s): CHOL, HDL, LDLCALC, TRIG, CHOLHDL, LDLDIRECT in the last 72 hours. ?Thyroid Function Tests ?Recent Labs  ?  09/12/21 ?0408  ?TSH 4.881*  ? ? ?Other results: ? ? ?Imaging  ? ? ?ECHOCARDIOGRAM COMPLETE ? ?Result Date: 09/12/2021 ?   ECHOCARDIOGRAM REPORT   Patient Name:   Roy Dyer Date of Exam: 09/12/2021 Medical Rec #:  [id number]    Height:       67.0 in Accession #:    [id number]   Weight:       183.0 lb Date of Birth:  06-15-1965    BSA:          1.947  m? Patient Age:    56 years     BP:           117/66 mmHg Patient Gender: M            HR:           79 bpm. Exam Location:  Inpatient Procedure: 2D Echo, Cardiac Doppler, Color Doppler and Intracardiac            Opacification Agent Indications:    Elevated troponin  History:        Patient has prior history of Echocardiogram examinations. CHF,                 CAD, Arrythmias:Atrial Flutter; Risk Factors:Hypertension and                 Sleep Apnea.  Sonographer:    Jyl Heinz Referring Phys: [place]  1. Left ventricular ejection fraction, by estimation, is 30 to 35%. The left ventricle has moderately decreased function. The left ventricle demonstrates global hypokinesis. The left ventricular internal cavity size was severely dilated. There is mild left ventricular hypertrophy. Left ventricular diastolic parameters are consistent with Grade II diastolic dysfunction (pseudonormalization). Elevated left atrial pressure.  2. Right ventricular systolic function is mildly reduced. The right ventricular size is moderately enlarged. Moderately increased right ventricular wall thickness. There is severely elevated pulmonary artery systolic pressure. The estimated right ventricular systolic pressure is 93.2 mmHg.  3. Left atrial size was severely dilated.  4. Right atrial size was severely dilated.  5. Moderate pericardial effusion. IVC fixed/dilated, but no RA/RV collapse seen to suggest tamponade  6. The mitral valve is normal in structure. Mild mitral valve regurgitation. No evidence of mitral stenosis.  7. Tricuspid valve regurgitation is moderate.  8. The aortic valve is tricuspid. Aortic valve regurgitation is trivial. No aortic stenosis is present.  9. The inferior vena cava is dilated in size with <50% respiratory variability, suggesting right atrial pressure of 15 mmHg. FINDINGS  Left Ventricle: Left ventricular ejection fraction, by estimation, is 30 to 35%. The left ventricle has  moderately decreased function. The left ventricle demonstrates global hypokinesis. Definity contrast agent  was given IV to delineate the left ventricular endocardial borders. The left ventricular internal cavity size was severely dilated. There is mild left ventricular hypertrophy. Left ventricular diastolic parameters are consistent with Grade II diastolic dysfunction (pseudonormalization). Elevated left atrial pressure. Right Ventricle: The right ventricular size is moderately enlarged. Moderately increased right ventricular wall thickness. Right ventricular systolic function is mildly reduced. There is severely elevated pulmonary artery systolic pressure. The tricuspid  regurgitant velocity is 3.88 m/s, and with an assumed right atrial pressure of 15 mmHg, the estimated right ventricular systolic pressure is 37.1 mmHg. Left Atrium: Left atrial size was severely dilated. Right Atrium: Right atrial size was severely dilated. Pericardium: A moderately sized pericardial effusion is present. Mitral Valve: The mitral valve is normal in structure. Mild mitral valve regurgitation. No evidence of mitral valve stenosis. Tricuspid Valve: The tricuspid valve is normal in structure. Tricuspid valve regurgitation is moderate. Aortic Valve: The aortic valve is tricuspid. Aortic valve regurgitation is trivial. No aortic stenosis is present. Aortic valve peak gradient measures 9.6 mmHg. Pulmonic Valve: The pulmonic valve was normal in structure. Pulmonic valve regurgitation is trivial. Aorta: The aortic root and ascending aorta are structurally normal, with no evidence of dilitation. Venous: The inferior vena cava is dilated in size with less than 50% respiratory variability, suggesting right atrial pressure of 15 mmHg. IAS/Shunts: The interatrial septum was not well visualized.  LEFT VENTRICLE PLAX 2D LVIDd:         6.80 cm      Diastology LVIDs:         5.70 cm      LV e' medial:    4.90 cm/s LV PW:         1.20 cm      LV E/e'  medial:  15.1 LV IVS:        1.20 cm      LV e' lateral:   7.51 cm/s LVOT diam:     2.20 cm      LV E/e' lateral: 9.9 LV SV:         59 LV SV Index:   30 LVOT Area:     3.80 cm?                              3

## 2021-09-13 NOTE — TOC CM/SW Note (Signed)
.. ?  Transition of Care (TOC) Screening Note ? ? ?Patient Details  ?Name: Roy Dyer ?Date of Birth: 07-30-1965 ? ? ?Transition of Care [facility]) CM/SW Contact:    ?Marcheta Grammes Rexene Alberts, RN ?Phone Number: [phone] ?09/19/2021, 9:03 AM ? ? ? ?Transition of Care Department [facility]) has reviewed patient. We will continue to monitor patient advancement through interdisciplinary progression rounds. Waiting PT/OT recommendations. Roy Dyer RN3 CCM, Heart Failure TOC CM [phone]  ?

## 2021-09-13 NOTE — Progress Notes (Signed)
Initial Nutrition Assessment ? ?DOCUMENTATION CODES:  ? ?Non-severe (moderate) malnutrition in context of chronic illness ? ?INTERVENTION:  ?- Given pt with ongoing poor appetite and very minimal PO intake, pt would benefit from a liberalized diet from a heart healthy to a regular diet to provide widest variety of menu options to enhance nutritional adequacy ? ?- Ensure Enlive po TID, each supplement provides 350 kcal and 20 grams of protein. ? ?- MVI with minerals daily ? ?NUTRITION DIAGNOSIS:  ? ?Moderate Malnutrition related to chronic illness (pericarditis, cardiomyopathy, CAD, renal disease, lupus) as evidenced by mild fat depletion, moderate muscle depletion, energy intake < or equal to 75% for > or equal to 1 month, percent weight loss. ? ?GOAL:  ? ?Patient will meet greater than or equal to 90% of their needs ? ?MONITOR:  ? ?PO intake, Supplement acceptance, Labs, Weight trends, I & O's, Diet advancement ? ?REASON FOR ASSESSMENT:  ? ?Consult ?Assessment of nutrition requirement/status ? ?ASSESSMENT:  ? ?Pt admitted with acute respiratory failure with hypoxia. PMH significant for aflutter, pleuritic chest pain d/t pericarditis, ischemic cardiomyopathy, CAD with multiple stents, CKD, afib, s/p renal transplant, prediabetes, lupus s/p AICD, avascular necrosis or the hip, Barrett's esophagus on PPI. ? ?Noted plans for cardiac cath today.  ? ?Pt states that since his admission in October 2022 for pericarditis, with the addition of medications he has been eating significantly less than he used to. Within the last 3 weeks, he has had no appetite given increased difficulty breathing and weakness. Within the last week he has only been able to eat a banana and occasionally orange juice. Pt does not drink protein supplements at home but is agreeable to try them during admission.  ? ?Pt would benefit from a liberalized diet given malnutrition and chronically poor PO intake. Given he continues with poor appetite, it's  unlikely he will exceed any nutrient limits.  ? ?Pt reports good mobility and was previously working PTA.  ? ?Pt states that he used to weigh 245 lbs last year. About 6 months ago he was ~230 lbs and endorses weight loss since then with a current weight ~176 lbs. Reviewed weight history. Pt's weight noted to be trending down within the last year. He has had a 20% weight loss within the last year which is clinically significant for time frame.  ? ?Medications: pepcid, protonix, prednisone, IV abx ? ?Labs: BUN 43, Cr 2.40, ionized calcium 1.01, phosphorus 5.0 ? ?UOP: 783m x24 hours ?I/O's: -6560msince admission ? ?NUTRITION - FOCUSED PHYSICAL EXAM: ? ?Flowsheet Row Most Recent Value  ?Orbital Region Mild depletion  ?Upper Arm Region Moderate depletion  ?Thoracic and Lumbar Region No depletion  ?Buccal Region Mild depletion  ?Temple Region No depletion  ?Clavicle Bone Region No depletion  ?Clavicle and Acromion Bone Region No depletion  ?Scapular Bone Region No depletion  ?Dorsal Hand Mild depletion  ?Patellar Region Moderate depletion  ?Anterior Thigh Region Moderate depletion  ?Posterior Calf Region No depletion  ?Edema (RD Assessment) None  ?Hair Reviewed  ?Eyes Reviewed  ?Mouth Reviewed  ?Skin Reviewed  ?Nails Reviewed  ? ?  ? ?Diet Order:   ?Diet Order   ? ?       ?  Diet Heart Room service appropriate? Yes; Fluid consistency: Thin; Fluid restriction: 1500 mL Fluid  Diet effective now       ?  ? ?  ?  ? ?  ? ?EDUCATION NEEDS:  ? ?Education needs have been addressed ? ?Skin:  Skin Assessment: Reviewed RN Assessment ? ?Last BM:  5/2 ? ?Height:  ? ?Ht Readings from Last 1 Encounters:  ?09/16/2021 '5\' 7"'$  (1.702 m)  ? ? ?Weight:  ? ?Wt Readings from Last 1 Encounters:  ?09/18/2021 81.1 kg  ? ?BMI:  Body mass index is 27.99 kg/m?. ? ?Estimated Nutritional Needs:  ? ?Kcal:  2300-2500 ? ?Protein:  115-130g ? ?Fluid:  >/=2.3L ? ?Clayborne Dana, RDN, LDN ?Clinical Nutrition ?

## 2021-09-13 NOTE — Assessment & Plan Note (Addendum)
Acute hypoxemic respiratory failure.  ? ?Echocardiogram with reduced LV systolic function with EF 30 to 35%, global hypokinesis, mild reduction in RV systolic function, severe elevated pulmonary artery pressures, RVSP 75.2 mmHg  ?Left and right atrium with severe dilatation. Moderate pericardial effusion. Moderate TR,  ? ?Pulmonary hypertension, acute on chronic core pulmonale.  ? ?05/03 catheterization: PA 79/35, PA mean 55. PCWP 24. CO/CI 6,9/3.1 Fick. ? ?05/05 poor urine output despite aggressive diuresis, patient placed on dobutamine drip (3 mcg/kg/min) and furosemide drip 15 mg/hr.  ?Metolazone 5 mg 05/04.  ? ?Urine output is 650 ml over last 24 hrs  ?Systolic blood pressure 147 to 111 mmHg.  ? ?Metabolic encephalopathy: Patient with somnolence today and mild confusion. Possible hypoperfusion.  ?Discontinue clonazepam and baclofen.  ?Decrease paroxetine to 20 mg.  ? ?Continue with inotropic support along with aggressive diuresis. ?Patient may need CRRT for further fluid removal.  ?Add stress dose steroids for suspected adrenal insufficiency in the setting of chronic prednisone use.  ?

## 2021-09-13 NOTE — Assessment & Plan Note (Addendum)
Continue inotropic support with dobutamine and blood pressure support with midodrine. ? ?Add stress dose steroids, for potential adrenal insufficiency.  ?

## 2021-09-13 NOTE — Progress Notes (Signed)
IVT consulted for difficult stick.  Upon arrival, pt not in room.  Called RN Poonam.  Advised if CT needs IV to place new consult. ?

## 2021-09-13 NOTE — H&P (View-Only) (Signed)
? ? Advanced Heart Failure Rounding Note ? ?PCP-Cardiologist: [name], MD  ? ?Subjective:   ? ?5/2: Admitted w/ a/c CHF,  AKI on CKD and ? CAP.  ? ?Echo: EF 30-35% (stable), severe BAE, RV mod reduced, mod TR, Mod Pericardial Effusion w/o tamponade, IVC dilated, RVSP severely elevated 75 mmHg  ? ?ESR normal @ 5. CRP pending.   ? ?Strict I/Os not recorded yesterday but Wt trending down w/ IV Lasix, down 4 lb.  ? ?Scr trending down, 2.73>>2.40. Tacrolimus level pending  ?K 3.8  ? ?On rocephin and azithromycin for PNA. WBC 17K. PCT 1.16>>1.23>>1.16. Strep pneumo negative. Legionella pending  ? ?Feels ok currently. No current resting dyspnea. Had pleuritic CP ~3 wks ago but not currently. BP norotensive. No syncope/ near syncope.  ? ?Scheduled for RHC this afternoon.  ? ? ?Objective:   ?Weight Range: ?81.1 kg ?Body mass index is 27.99 kg/m?.  ? ?Vital Signs:   ?Temp:  [97.6 ?F (36.4 ?C)-98 ?F (36.7 ?C)] 97.7 ?F (36.5 ?C) (05/03 8891) ?Pulse Rate:  [66-84] 72 (05/03 0909) ?Resp:  [18-34] 20 (05/03 0809) ?BP: (108-127)/(67-79) 127/77 (05/03 6945) ?SpO2:  [85 %-97 %] 85 % (05/03 0909) ?Weight:  [81.1 kg] 81.1 kg (05/03 0549) ?Last BM Date : 09/12/21 ? ?Weight change: ?Filed Weights  ? 09/24/2021 1959 09/12/2021 0549  ?Weight: 83 kg 81.1 kg  ? ? ?Intake/Output:  ? ?Intake/Output Summary (Last 24 hours) at 09/15/2021 1136 ?Last data filed at 10/10/2021 0400 ?Gross per 24 hour  ?Intake 450 ml  ?Output 700 ml  ?Net -250 ml  ?  ? ? ?Physical Exam  ?  ?General:  Well appearing. No resp difficulty ?HEENT: Normal ?Neck: Supple. JVP 10 cm . Carotids 2+ bilat; no bruits. No lymphadenopathy or thyromegaly appreciated. ?Cor: PMI nondisplaced. Regular rate & rhythm.  + Friction rub LSB, 2/6 TR murmur  ?Lungs: decreased BS at the bases bilaterally w/ LLL crackles  ?Abdomen: Soft, nontender, nondistended. No hepatosplenomegaly. No bruits or masses. Good bowel sounds. ?Extremities: No cyanosis, clubbing, rash, edema ?Neuro: Alert &  orientedx3, cranial nerves grossly intact. moves all 4 extremities w/o difficulty. Affect pleasant ? ? ?Telemetry  ? ?NSR 77 bpm  ? ?EKG  ?  ?No new EKG to review  ? ?Labs  ?  ?CBC ?Recent Labs  ?  10/05/2021 ?1617 10/09/2021 ?2224 09/12/21 ?0408  ?WBC 17.3*  --  17.7*  ?NEUTROABS 14.9*  --   --   ?HGB 13.9 14.3 12.3*  ?HCT 47.9 42.0 39.8  ?MCV 79.2*  --  78.0*  ?PLT 408*  --  330  ? ?Basic Metabolic Panel ?Recent Labs  ?  09/21/2021 ?2202 09/12/2021 ?2224 09/12/21 ?0408 10/02/2021 ?0388  ?NA  --    < > 131* 135  ?K  --    < > 4.4 3.8  ?CL  --   --  95* 98  ?CO2  --   --  23 24  ?GLUCOSE  --   --  106* 106*  ?BUN  --   --  43* 43*  ?CREATININE  --   --  2.73* 2.40*  ?CALCIUM  --   --  8.4* 8.3*  ?MG 1.8  --  1.8  --   ?PHOS 4.8*  --  5.0*  --   ? < > = values in this interval not displayed.  ? ?Liver Function Tests ?Recent Labs  ?  09/12/21 ?0408 10/01/2021 ?8280  ?AST 29 26  ?ALT 26 20  ?  ALKPHOS 107 92  ?BILITOT 1.4* 1.3*  ?PROT 5.2* 4.7*  ?ALBUMIN 2.6* 2.3*  ? ?No results for input(s): LIPASE, AMYLASE in the last 72 hours. ?Cardiac Enzymes ?Recent Labs  ?  09/12/2021 ?2202  ?CKTOTAL 37*  ? ? ?BNP: ?BNP (last 3 results) ?Recent Labs  ?  02/14/21 ?2032 05/02/21 ?1234 09/14/2021 ?1617  ?BNP 1,742.3* 1,152.1* 1,553.9*  ? ? ?ProBNP (last 3 results) ?No results for input(s): PROBNP in the last 8760 hours. ? ? ?D-Dimer ?No results for input(s): DDIMER in the last 72 hours. ?Hemoglobin A1C ?No results for input(s): HGBA1C in the last 72 hours. ?Fasting Lipid Panel ?No results for input(s): CHOL, HDL, LDLCALC, TRIG, CHOLHDL, LDLDIRECT in the last 72 hours. ?Thyroid Function Tests ?Recent Labs  ?  09/12/21 ?0408  ?TSH 4.881*  ? ? ?Other results: ? ? ?Imaging  ? ? ?ECHOCARDIOGRAM COMPLETE ? ?Result Date: 09/12/2021 ?   ECHOCARDIOGRAM REPORT   Patient Name:   DEKE TILGHMAN Date of Exam: 09/12/2021 Medical Rec #:  [id number]    Height:       67.0 in Accession #:    [id number]   Weight:       183.0 lb Date of Birth:  1965-07-11    BSA:          1.947  m? Patient Age:    56 years     BP:           117/66 mmHg Patient Gender: M            HR:           79 bpm. Exam Location:  Inpatient Procedure: 2D Echo, Cardiac Doppler, Color Doppler and Intracardiac            Opacification Agent Indications:    Elevated troponin  History:        Patient has prior history of Echocardiogram examinations. CHF,                 CAD, Arrythmias:Atrial Flutter; Risk Factors:Hypertension and                 Sleep Apnea.  Sonographer:    Jyl Heinz Referring Phys: [place]  1. Left ventricular ejection fraction, by estimation, is 30 to 35%. The left ventricle has moderately decreased function. The left ventricle demonstrates global hypokinesis. The left ventricular internal cavity size was severely dilated. There is mild left ventricular hypertrophy. Left ventricular diastolic parameters are consistent with Grade II diastolic dysfunction (pseudonormalization). Elevated left atrial pressure.  2. Right ventricular systolic function is mildly reduced. The right ventricular size is moderately enlarged. Moderately increased right ventricular wall thickness. There is severely elevated pulmonary artery systolic pressure. The estimated right ventricular systolic pressure is 66.4 mmHg.  3. Left atrial size was severely dilated.  4. Right atrial size was severely dilated.  5. Moderate pericardial effusion. IVC fixed/dilated, but no RA/RV collapse seen to suggest tamponade  6. The mitral valve is normal in structure. Mild mitral valve regurgitation. No evidence of mitral stenosis.  7. Tricuspid valve regurgitation is moderate.  8. The aortic valve is tricuspid. Aortic valve regurgitation is trivial. No aortic stenosis is present.  9. The inferior vena cava is dilated in size with <50% respiratory variability, suggesting right atrial pressure of 15 mmHg. FINDINGS  Left Ventricle: Left ventricular ejection fraction, by estimation, is 30 to 35%. The left ventricle has  moderately decreased function. The left ventricle demonstrates global hypokinesis. Definity contrast agent  was given IV to delineate the left ventricular endocardial borders. The left ventricular internal cavity size was severely dilated. There is mild left ventricular hypertrophy. Left ventricular diastolic parameters are consistent with Grade II diastolic dysfunction (pseudonormalization). Elevated left atrial pressure. Right Ventricle: The right ventricular size is moderately enlarged. Moderately increased right ventricular wall thickness. Right ventricular systolic function is mildly reduced. There is severely elevated pulmonary artery systolic pressure. The tricuspid  regurgitant velocity is 3.88 m/s, and with an assumed right atrial pressure of 15 mmHg, the estimated right ventricular systolic pressure is 51.0 mmHg. Left Atrium: Left atrial size was severely dilated. Right Atrium: Right atrial size was severely dilated. Pericardium: A moderately sized pericardial effusion is present. Mitral Valve: The mitral valve is normal in structure. Mild mitral valve regurgitation. No evidence of mitral valve stenosis. Tricuspid Valve: The tricuspid valve is normal in structure. Tricuspid valve regurgitation is moderate. Aortic Valve: The aortic valve is tricuspid. Aortic valve regurgitation is trivial. No aortic stenosis is present. Aortic valve peak gradient measures 9.6 mmHg. Pulmonic Valve: The pulmonic valve was normal in structure. Pulmonic valve regurgitation is trivial. Aorta: The aortic root and ascending aorta are structurally normal, with no evidence of dilitation. Venous: The inferior vena cava is dilated in size with less than 50% respiratory variability, suggesting right atrial pressure of 15 mmHg. IAS/Shunts: The interatrial septum was not well visualized.  LEFT VENTRICLE PLAX 2D LVIDd:         6.80 cm      Diastology LVIDs:         5.70 cm      LV e' medial:    4.90 cm/s LV PW:         1.20 cm      LV E/e'  medial:  15.1 LV IVS:        1.20 cm      LV e' lateral:   7.51 cm/s LVOT diam:     2.20 cm      LV E/e' lateral: 9.9 LV SV:         59 LV SV Index:   30 LVOT Area:     3.80 cm?                              3

## 2021-09-13 NOTE — Consult Note (Signed)
WOC Nurse Consult Note: ?Reason for Consult:Patient request for consult due to chronic cracking of bilateral posterior heels. Has tried Eucerin cream. ?Wound type:Partial thickness ?Pressure Injury POA: N/A ?Measurement:<0.5cm x 0.2cm x 0.1cm ?Wound bed: dry, red with dried serum ?Drainage (amount, consistency, odor) none ?Periwound:intact ?Dressing procedure/placement/frequency:I have provided guidance to patient  and Nursing for application of 16% urea lotion to bilateral heels twice daily. Patient instructed to wear socks to prevent shoe wear from rubbing area and top lotion before retiring each evening. ? ?[place] nursing team will not follow, but will remain available to this patient, the nursing and medical teams.  Please re-consult if needed. ?Thanks, ?Maudie Flakes, MSN, RN, [place], [place], [facility], [place]  ?Pager# [phone]  ? ? ? ?  ?

## 2021-09-13 NOTE — Progress Notes (Signed)
?  Progress Note ? ? ?Patient: Roy Dyer VOJ:[id number] DOB: 1965-06-30 DOA: 10/10/2021     2 ?DOS: the patient was seen and examined on 09/28/2021 ?  ?Brief hospital course: ?56 year old gentleman with history of CKD stage IIIb, lupus status post renal transplant, coronary artery disease status post multiple stents, ischemic cardiomyopathy, AICD, hypertension, a flutter and prediabetes admitted with worsening shortness of breath, found to have AKI as well as fluid overload.  Baseline creatinine about 1.5-1.7.  Hypoxemic on arrival.  Chest x-ray with cardiovascular prominence.  Admitted with nephrology and cardiology consultations. ?  ?Placed on aggressive diuresis. ? ?05/03 cardiac catheterization with elevated filling pressures.  ? ?Assessment and Plan: ?Acute on chronic combined systolic and diastolic CHF (congestive heart failure) ([place]) ?Acute hypoxemic respiratory failure.  ? ?Echocardiogram with reduced LV systolic function with EF 35%.  ?Cardiac catheterization with PCWP  ? ?Pulmonary hypertension, acute on chronic core pulmonale.  ? ?05/03 catheterization: PA 79/35, PA mean 55. PCWP 24. CO/CI 6,9/3.1 Fick. ? ?Documented urine output is 675 ml over last 24 hrs.  ?Plan to continue diuresis with furosemide  80 mg x1.  ? ? ? ? ?CAP (community acquired pneumonia) ?Positive leukocytosis. ?Chest radiograph with cardiomegaly and bilateral interstitial infiltrates, more confluent right lower lobe.  ? ?Plan to follow up chest film and cell count am. ?If improvement will plan to discontinue antibiotic therapy.   ? ?Acute kidney injury superimposed on chronic kidney disease ([place]) ?CKD stage 3b. Hypokalemia, hyponatremia  ?Sp renal transplant.  ? ?Renal function with serum cr at 2.4 with K at 3,8 down to 3,3 with serum bicarbonate at 24. ? ?Plan to contineue K correction with Kcl. Follow renal function in am.  ? ?Continue immunosuppressive therapy for renal transplant.  ? ?Essential hypertension, benign ?Continue blood  pressure monitoring.  ? ?Mixed hyperlipidemia ?Continue with statin therapy.  ? ?GERD (gastroesophageal reflux disease) ?Continue antiacid therapy.  ? ?LUPUS ?Stable with no acute flare.  ? ?Atrial flutter ([place]) ?Non sustained ventricular tachycardia,  ? ?Continue with amiodarone and anticoagulation with apixaban.  ? ?Coronary artery disease with exertional angina ([place]) ?Continue medical therapy. ?Patient with no chest pain.  ? ?Malnutrition of moderate degree ?Continue with nutritional supplements.  ? ? ? ? ?  ? ?Subjective: Patient continue to have dyspnea, no chest pain, edema improving.  ? ?Physical Exam: ?Vitals:  ? 09/27/2021 1528 10/04/2021 1543 10/05/2021 1650 10/06/2021 1937  ?BP: 107/72 118/69 121/75 120/74  ?Pulse:    87  ?Resp:    18  ?Temp:   97.6 ?F (36.4 ?C) 98.5 ?F (36.9 ?C)  ?TempSrc:    Axillary  ?SpO2: 92% 92% 92% 91%  ?Weight:      ?Height:      ? ?Neurology awake and alert ?ENT with mild pallor ?Cardiovascular with S1 and S2 present and rhythmic with no gallops, or rubs positive systolic murmur at the right sternal border ?No JVD ?Trace lower extremity edema ?Respiratory with no rales or wheezing ?Abdomen not ditended  ?Data Reviewed: ? ? ? ?Family Communication: no family at the bedside  ? ?Disposition: ?Status is: Inpatient ?Remains inpatient appropriate because: heart failure  ? Planned Discharge Destination: Home ? ? ? ? ?Author: ?Tawni Millers, MD ?10/08/2021 9:35 PM ? ?For on call review [email or web address].  ?

## 2021-09-13 NOTE — Assessment & Plan Note (Addendum)
CKD stage 3b. Hypokalemia, hyponatremia. Hyperphosphatemia  ?Sp renal transplant.  ? ?Renal function with serum cr at 3,98 with K at 4,1 and serum bicarbonate at 22, NA 131. P 6,2  ? ?Has a left upper extremity fistula in place.  ? ?Continue immunosuppressive therapy for renal transplant.  ?Continue diuresis with furosemide and inotropic support with dobutamine. ?Add phosphate binder.   ?May need CRRT if not clinically improving.  ? ?

## 2021-09-13 NOTE — Progress Notes (Addendum)
?[facility] ?Progress Note  ? ? ?Assessment/ Plan:   ?1. AKI on CKD Stage IIIB ?Etiology thought to be secondary to CHF exacerbation. Kidney function improving today (Cr down to 2.4 from 2.7) after increased dose of diuretics yesterday ('80mg'$  IV Lasix x1) which further supports a cardiorenal etiology. Baseline Scr ~1.7. ?-Continue diuresis per CHF team, agree with plan for Lasix '80mg'$  IV BID ?-Scheduled for right heart cath later today ?-Strict I/Os ?-Avoid nephrotoxic agents ?-Daily renal function panel ? ?2. S/p renal transplant; Lupus ?No concern for transplant-related complication at this time. Renal ultrasound on admission was unremarkable. ?-Continue home immunosuppression regimen (Envarsus, CellCept, Prednisone) ? ?3. Acute on Chronic HFrEF ?-Plan per CHF team ?-Diuresis/RHC as above ?-Holding Entresto in light of AKI ? ?4. Hyponatremia ?Resolved. Monitor on daily labs ? ?Remainder including CAP, CAD, severe tricuspid regurg, pulmonary HTN, pAF, recent pericarditis per primary team and cardiology. ? ? ?Subjective:   ?No acute events overnight. Patient without specific complaints this morning. Reports his breathing is unchanged- he feels fine on 2L nasal cannula but gets short of breath on room air or with any exertion. Denies chest pain.  ? ?Objective:   ?BP 127/77 (BP Location: Right Arm)   Pulse 72   Temp 97.7 ?F (36.5 ?C) (Oral)   Resp 20   Ht '5\' 7"'$  (1.702 m)   Wt 81.1 kg   SpO2 (!) 85%   BMI 27.99 kg/m?  ? ?Intake/Output Summary (Last 24 hours) at 09/29/2021 1153 ?Last data filed at 09/23/2021 0400 ?Gross per 24 hour  ?Intake 450 ml  ?Output 700 ml  ?Net -250 ml  ? ?Weight change: -1.942 kg ? ?Physical Exam: ?Gen: alert, resting comfortably in bed, NAD ?CVS: III/VI systolic murmur, pericardial rub noted ?Resp: mild crackles at L base, otherwise clear ?Abd: soft, nontender, nondistended ?Ext: no peripheral edema ? ?Imaging: ?DG Chest 2 View ? ?Result Date: 09/21/2021 ?CLINICAL DATA:   Shortness of breath. EXAM: CHEST - 2 VIEW COMPARISON:  February 14, 2021 FINDINGS: A single lead ventricular pacer is seen. Mild diffusely increased interstitial lung markings are seen. There is mild to moderate severity bilateral infrahilar atelectasis and/or infiltrate. There is no evidence of a pleural effusion or pneumothorax. Mild bilateral perihilar pulmonary vascular prominence is seen. The cardiac silhouette is markedly enlarged and unchanged in size. The visualized skeletal structures are unremarkable. IMPRESSION: 1. Stable cardiomegaly with mild congestive heart failure. 2. Mild to moderate severity bilateral infrahilar atelectasis and/or infiltrate. Electronically Signed   By: Virgina Norfolk M.D.   On: 09/14/2021 16:59  ? ?US Renal Transplant w/Doppler ? ?Result Date: 09/12/2021 ?CLINICAL DATA:  Acute kidney injury.  Renal transplant. EXAM: ULTRASOUND OF RENAL TRANSPLANT WITH RENAL DOPPLER ULTRASOUND TECHNIQUE: Ultrasound examination of the renal transplant was performed with gray-scale, color and duplex doppler evaluation. COMPARISON:  Renal transplant ultrasound 02/06/2021. FINDINGS: Transplant kidney location: RLQ Transplant Kidney: Renal measurements: 10.3 x 4.8 x 5.4 cm = volume: 152m. Normal in size and parenchymal echogenicity. No evidence of mass or hydronephrosis. No peri-transplant fluid collection seen. Color flow in the main renal artery:  Yes Color flow in the main renal vein:  Yes Duplex Doppler Evaluation: Main Renal Artery Velocity: 87.89 cm/sec Main Renal Artery Resistive Index: 0.84 Venous waveform in main renal vein:  Present Intrarenal resistive index in upper pole:  0.76 (normal 0.6-0.8; equivocal 0.8-0.9; abnormal >= 0.9) Intrarenal resistive index in lower pole: 0.75 (normal 0.6-0.8; equivocal 0.8-0.9; abnormal >= 0.9) Bladder: Normal for degree of bladder  distention. Other findings:  None. IMPRESSION: 1. Right lower quadrant renal transplant within normal limits. Electronically  Signed   By: Ronney Asters M.D.   On: 09/12/2021 02:05  ? ?ECHOCARDIOGRAM COMPLETE ? ?Result Date: 09/12/2021 ?   ECHOCARDIOGRAM REPORT   Patient Name:   Roy Dyer Date of Exam: 09/12/2021 Medical Rec #:  [id number]    Height:       67.0 in Accession #:    [id number]   Weight:       183.0 lb Date of Birth:  08/30/1965    BSA:          1.947 m? Patient Age:    56 years     BP:           117/66 mmHg Patient Gender: M            HR:           79 bpm. Exam Location:  Inpatient Procedure: 2D Echo, Cardiac Doppler, Color Doppler and Intracardiac            Opacification Agent Indications:    Elevated troponin  History:        Patient has prior history of Echocardiogram examinations. CHF,                 CAD, Arrythmias:Atrial Flutter; Risk Factors:Hypertension and                 Sleep Apnea.  Sonographer:    Jyl Heinz Referring Phys: Scott  1. Left ventricular ejection fraction, by estimation, is 30 to 35%. The left ventricle has moderately decreased function. The left ventricle demonstrates global hypokinesis. The left ventricular internal cavity size was severely dilated. There is mild left ventricular hypertrophy. Left ventricular diastolic parameters are consistent with Grade II diastolic dysfunction (pseudonormalization). Elevated left atrial pressure.  2. Right ventricular systolic function is mildly reduced. The right ventricular size is moderately enlarged. Moderately increased right ventricular wall thickness. There is severely elevated pulmonary artery systolic pressure. The estimated right ventricular systolic pressure is 54.6 mmHg.  3. Left atrial size was severely dilated.  4. Right atrial size was severely dilated.  5. Moderate pericardial effusion. IVC fixed/dilated, but no RA/RV collapse seen to suggest tamponade  6. The mitral valve is normal in structure. Mild mitral valve regurgitation. No evidence of mitral stenosis.  7. Tricuspid valve regurgitation is moderate.  8. The  aortic valve is tricuspid. Aortic valve regurgitation is trivial. No aortic stenosis is present.  9. The inferior vena cava is dilated in size with <50% respiratory variability, suggesting right atrial pressure of 15 mmHg. FINDINGS  Left Ventricle: Left ventricular ejection fraction, by estimation, is 30 to 35%. The left ventricle has moderately decreased function. The left ventricle demonstrates global hypokinesis. Definity contrast agent was given IV to delineate the left ventricular endocardial borders. The left ventricular internal cavity size was severely dilated. There is mild left ventricular hypertrophy. Left ventricular diastolic parameters are consistent with Grade II diastolic dysfunction (pseudonormalization). Elevated left atrial pressure. Right Ventricle: The right ventricular size is moderately enlarged. Moderately increased right ventricular wall thickness. Right ventricular systolic function is mildly reduced. There is severely elevated pulmonary artery systolic pressure. The tricuspid  regurgitant velocity is 3.88 m/s, and with an assumed right atrial pressure of 15 mmHg, the estimated right ventricular systolic pressure is 50.3 mmHg. Left Atrium: Left atrial size was severely dilated. Right Atrium: Right atrial size was severely dilated. Pericardium: A moderately  sized pericardial effusion is present. Mitral Valve: The mitral valve is normal in structure. Mild mitral valve regurgitation. No evidence of mitral valve stenosis. Tricuspid Valve: The tricuspid valve is normal in structure. Tricuspid valve regurgitation is moderate. Aortic Valve: The aortic valve is tricuspid. Aortic valve regurgitation is trivial. No aortic stenosis is present. Aortic valve peak gradient measures 9.6 mmHg. Pulmonic Valve: The pulmonic valve was normal in structure. Pulmonic valve regurgitation is trivial. Aorta: The aortic root and ascending aorta are structurally normal, with no evidence of dilitation. Venous: The  inferior vena cava is dilated in size with less than 50% respiratory variability, suggesting right atrial pressure of 15 mmHg. IAS/Shunts: The interatrial septum was not well visualized.  LEFT VENTRICLE PLAX 2D LVIDd:

## 2021-09-13 NOTE — Assessment & Plan Note (Signed)
Continue with nutritional supplements.  

## 2021-09-13 NOTE — Interval H&P Note (Signed)
History and Physical Interval Note: ? ?10/04/2021 ?1:14 PM ? ?Roy Dyer  has presented today for surgery, with the diagnosis of heart failure.  The various methods of treatment have been discussed with the patient and family. After consideration of risks, benefits and other options for treatment, the patient has consented to  Procedure(s): ?RIGHT HEART CATH (N/A) as a surgical intervention.  The patient's history has been reviewed, patient examined, no change in status, stable for surgery.  I have reviewed the patient's chart and labs.  Questions were answered to the patient's satisfaction.   ? ? ?Roy Dyer ? ? ?

## 2021-09-13 NOTE — Progress Notes (Signed)
Attempted room air. Patient desat to 86%. Placed back on 2L oxygen via nasal cannula and CPAP with 2L while sleeping. NPO since midnight for anticipated heart cath today.  ?

## 2021-09-13 NOTE — Hospital Course (Addendum)
56 year old gentleman with history of CKD stage IIIb, lupus status post renal transplant, coronary artery disease status post multiple stents, ischemic cardiomyopathy, AICD, hypertension, a flutter and prediabetes admitted with worsening shortness of breath, found to have AKI as well as fluid overload.  Baseline creatinine about 1.5-1.7.  Hypoxemic on arrival.  Chest x-ray with cardiovascular prominence.  Admitted with nephrology and cardiology consultations. ?  ?Placed on aggressive diuresis. ? ?05/03 cardiac catheterization with elevated filling pressures.  ?05/06 continue with signs of pulmonary edema and increased 02 requirements. ?Placed on high flow nasal cannula and inotropic support. ?Stress dose steroids, for suspected adrenal insufficiency in the setting of chronic prednisone use.  ?

## 2021-09-14 ENCOUNTER — Inpatient Hospital Stay (HOSPITAL_COMMUNITY): Payer: Managed Care, Other (non HMO)

## 2021-09-14 ENCOUNTER — Encounter (HOSPITAL_COMMUNITY): Payer: Self-pay | Admitting: Internal Medicine

## 2021-09-14 DIAGNOSIS — E440 Moderate protein-calorie malnutrition: Secondary | ICD-10-CM

## 2021-09-14 DIAGNOSIS — N179 Acute kidney failure, unspecified: Secondary | ICD-10-CM | POA: Diagnosis not present

## 2021-09-14 DIAGNOSIS — J189 Pneumonia, unspecified organism: Secondary | ICD-10-CM | POA: Diagnosis not present

## 2021-09-14 DIAGNOSIS — I10 Essential (primary) hypertension: Secondary | ICD-10-CM | POA: Diagnosis not present

## 2021-09-14 DIAGNOSIS — I5043 Acute on chronic combined systolic (congestive) and diastolic (congestive) heart failure: Secondary | ICD-10-CM | POA: Diagnosis not present

## 2021-09-14 LAB — CBC WITH DIFFERENTIAL/PLATELET
Abs Immature Granulocytes: 0.35 10*3/uL — ABNORMAL HIGH (ref 0.00–0.07)
Basophils Absolute: 0 10*3/uL (ref 0.0–0.1)
Basophils Relative: 0 %
Eosinophils Absolute: 0 10*3/uL (ref 0.0–0.5)
Eosinophils Relative: 0 %
HCT: 42.5 % (ref 39.0–52.0)
Hemoglobin: 13.2 g/dL (ref 13.0–17.0)
Immature Granulocytes: 2 %
Lymphocytes Relative: 2 %
Lymphs Abs: 0.4 10*3/uL — ABNORMAL LOW (ref 0.7–4.0)
MCH: 24 pg — ABNORMAL LOW (ref 26.0–34.0)
MCHC: 31.1 g/dL (ref 30.0–36.0)
MCV: 77.1 fL — ABNORMAL LOW (ref 80.0–100.0)
Monocytes Absolute: 2.4 10*3/uL — ABNORMAL HIGH (ref 0.1–1.0)
Monocytes Relative: 11 %
Neutro Abs: 18 10*3/uL — ABNORMAL HIGH (ref 1.7–7.7)
Neutrophils Relative %: 85 %
Platelets: 354 10*3/uL (ref 150–400)
RBC: 5.51 MIL/uL (ref 4.22–5.81)
RDW: 18.7 % — ABNORMAL HIGH (ref 11.5–15.5)
WBC: 21.2 10*3/uL — ABNORMAL HIGH (ref 4.0–10.5)
nRBC: 0 % (ref 0.0–0.2)

## 2021-09-14 LAB — BLOOD GAS, ARTERIAL
Acid-base deficit: 3.5 mmol/L — ABNORMAL HIGH (ref 0.0–2.0)
Bicarbonate: 22.1 mmol/L (ref 20.0–28.0)
Drawn by: 27027
O2 Saturation: 96.3 %
Patient temperature: 37.1
pCO2 arterial: 41 mmHg (ref 32–48)
pH, Arterial: 7.34 — ABNORMAL LOW (ref 7.35–7.45)
pO2, Arterial: 80 mmHg — ABNORMAL LOW (ref 83–108)

## 2021-09-14 LAB — RENAL FUNCTION PANEL
Albumin: 2.3 g/dL — ABNORMAL LOW (ref 3.5–5.0)
Anion gap: 15 (ref 5–15)
BUN: 49 mg/dL — ABNORMAL HIGH (ref 6–20)
CO2: 19 mmol/L — ABNORMAL LOW (ref 22–32)
Calcium: 8.6 mg/dL — ABNORMAL LOW (ref 8.9–10.3)
Chloride: 99 mmol/L (ref 98–111)
Creatinine, Ser: 2.4 mg/dL — ABNORMAL HIGH (ref 0.61–1.24)
GFR, Estimated: 31 mL/min — ABNORMAL LOW (ref >60)
Glucose, Bld: 159 mg/dL — ABNORMAL HIGH (ref 70–99)
Phosphorus: 4.5 mg/dL (ref 2.5–4.6)
Potassium: 4.1 mmol/L (ref 3.5–5.1)
Sodium: 133 mmol/L — ABNORMAL LOW (ref 135–145)

## 2021-09-14 LAB — C-REACTIVE PROTEIN: CRP: 31 mg/dL — ABNORMAL HIGH (ref <1.0)

## 2021-09-14 LAB — TROPONIN I (HIGH SENSITIVITY): Troponin I (High Sensitivity): 45 ng/L — ABNORMAL HIGH (ref <18)

## 2021-09-14 LAB — PROCALCITONIN: Procalcitonin: 1.25 ng/mL

## 2021-09-14 LAB — TACROLIMUS LEVEL: Tacrolimus (FK506) - LabCorp: 12.9 ng/mL (ref 2.0–20.0)

## 2021-09-14 LAB — GLUCOSE, CAPILLARY: Glucose-Capillary: 151 mg/dL — ABNORMAL HIGH (ref 70–99)

## 2021-09-14 LAB — BRAIN NATRIURETIC PEPTIDE: B Natriuretic Peptide: 1395.1 pg/mL — ABNORMAL HIGH (ref 0.0–100.0)

## 2021-09-14 MED ORDER — METOLAZONE 5 MG PO TABS
5.0000 mg | ORAL_TABLET | Freq: Once | ORAL | Status: AC
  Administered 2021-09-14: 5 mg via ORAL
  Filled 2021-09-14: qty 1

## 2021-09-14 MED ORDER — AMOXICILLIN-POT CLAVULANATE 875-125 MG PO TABS
1.0000 | ORAL_TABLET | Freq: Two times a day (BID) | ORAL | Status: AC
  Administered 2021-09-14 – 2021-09-15 (×3): 1 via ORAL
  Filled 2021-09-14 (×3): qty 1

## 2021-09-14 MED ORDER — FUROSEMIDE 10 MG/ML IJ SOLN
80.0000 mg | Freq: Two times a day (BID) | INTRAMUSCULAR | Status: DC
  Administered 2021-09-14 – 2021-09-15 (×3): 80 mg via INTRAVENOUS
  Filled 2021-09-14 (×3): qty 8

## 2021-09-14 MED ORDER — AZITHROMYCIN 250 MG PO TABS
500.0000 mg | ORAL_TABLET | Freq: Every day | ORAL | Status: AC
  Administered 2021-09-14 – 2021-09-15 (×2): 500 mg via ORAL
  Filled 2021-09-14 (×2): qty 2

## 2021-09-14 MED ORDER — MILRINONE LACTATE IN DEXTROSE 20-5 MG/100ML-% IV SOLN
0.1250 ug/kg/min | INTRAVENOUS | Status: DC
  Administered 2021-09-14: 0.125 ug/kg/min via INTRAVENOUS
  Filled 2021-09-14: qty 100

## 2021-09-14 NOTE — Progress Notes (Signed)
?  Mobility Specialist Criteria Algorithm Info. ? ? 09/14/21 1455  ?Mobility  ?Activity Ambulated with assistance in room;Ambulated with assistance to bathroom;Stood at bedside;Dangled on edge of bed  ?Range of Motion/Exercises Active;All extremities  ?Level of Assistance Contact guard assist, steadying assist  ?Assistive Device None  ?Distance Ambulated (ft) 20 ft  ?Activity Response Tolerated well  ? ?Patient received in supine, reluctant to participate in mobility. Requested assistance to restroom but declined further ambulation. Complained of lightheadedness, feeling "faint" and overall not feeling well. VSS throughout. Was left dangling EOB with all needs met, call bell in reach. ? ?09/14/2021 ?4:13 PM ? ?Roy Dyer, CMS, BS EXP ?[facility] Services  ?KJZPH:[phone] ?Office: [phone] ? ?

## 2021-09-14 NOTE — Progress Notes (Signed)
?Progress Note ? ? ?Patient: Roy Dyer KGM:[id number] DOB: 04-07-1966 DOA: 09/29/2021     3 ?DOS: the patient was seen and examined on 09/14/2021 ?  ?Brief hospital course: ?56 year old gentleman with history of CKD stage IIIb, lupus status post renal transplant, coronary artery disease status post multiple stents, ischemic cardiomyopathy, AICD, hypertension, a flutter and prediabetes admitted with worsening shortness of breath, found to have AKI as well as fluid overload.  Baseline creatinine about 1.5-1.7.  Hypoxemic on arrival.  Chest x-ray with cardiovascular prominence.  Admitted with nephrology and cardiology consultations. ?  ?Placed on aggressive diuresis. ? ?05/03 cardiac catheterization with elevated filling pressures.  ? ?Assessment and Plan: ?Acute on chronic combined systolic and diastolic CHF (congestive heart failure) ([place]) ?Acute hypoxemic respiratory failure.  ? ?Echocardiogram with reduced LV systolic function with EF 30 to 35%, global hypokinesis, mild reduction in RV systolic function, severe elevated pulmonary artery pressures, RVSP 75.2 mmHg  ?Left and right atrium with severe dilatation. Moderate pericardial effusion. Moderate TR,  ? ?Pulmonary hypertension, acute on chronic core pulmonale.  ? ?05/03 catheterization: PA 79/35, PA mean 55. PCWP 24. CO/CI 6,9/3.1 Fick. ? ?Continue patient with signs of pulmonary edema. ?Urine output over last 24 hrs is 875 ml.  ?Systolic blood pressure 644 to 98 mmHg.  ? ?Continue diuresis with furosemide 80 mg IV q12  ?Metolazone 5 mg today.  ? ?CAP (community acquired pneumonia) ?Positive leukocytosis. ?Chest radiograph with cardiomegaly and bilateral interstitial infiltrates, more confluent right lower lobe. ? ?Follow up chest film today with persistent pulmonary edema.   ?Elevated procalcitonin  ? ?Plan to continue antibiotic therapy with Augmentin and azithromycin. ?Plan for 5 day therapy.  ? ? ? ?Acute kidney injury superimposed on chronic kidney disease  ([place]) ?CKD stage 3b. Hypokalemia, hyponatremia  ?Sp renal transplant.  ? ?Renal function with serum cr at 2,40 with K at 4,1 and serum bicarbonate at 19.  ?Plan to continue diuresis with furosemide and metolazone.  ? ?Continue immunosuppressive therapy for renal transplant.  ? ?Essential hypertension, benign ?Continue blood pressure monitoring.  ? ?Mixed hyperlipidemia ?Continue with statin therapy.  ? ?GERD (gastroesophageal reflux disease) ?Continue antiacid therapy.  ? ?LUPUS ?Stable with no acute flare.  ? ?Atrial flutter ([place]) ?Non sustained ventricular tachycardia,  ? ?Continue with amiodarone and anticoagulation with apixaban.  ? ?Coronary artery disease with exertional angina ([place]) ?Continue medical therapy. ?Patient with no chest pain.  ? ?Malnutrition of moderate degree ?Continue with nutritional supplements.  ? ? ? ? ?  ? ?Subjective: Patient continue to have significant dyspnea on exertion.  ? ?Physical Exam: ?Vitals:  ? 09/14/21 0902 09/14/21 0932 09/14/21 1312 09/14/21 1550  ?BP: 103/65 1'07/71 97/70 98/73 '$  ?Pulse:  83 87 84  ?Resp:  '20 20 20  '$ ?Temp:  98 ?F (36.7 ?C)  98 ?F (36.7 ?C)  ?TempSrc:  Oral  Oral  ?SpO2: 99% 98% 90% 92%  ?Weight:      ?Height:      ? ?Neurology awake and alert, deconditioned and ill looking appearing ?ENT with mild pallor ?Cardiovascular with S1 and S2 present, positive systolic murmur at the apex ?Positive JVD ?Trace lower extremity edema ?Respiratory with rales bilateral at the lower lobes ?Abdomen soft and not distended.  ?Data Reviewed: ? ? ? ?Family Communication: no family at the bedside  ? ?Disposition: ?Status is: Inpatient ?Remains inpatient appropriate because: heart failure  ? Planned Discharge Destination: Home ? ? ? ? ? ?Author: ?Tawni Millers, MD ?09/14/2021 4:23  PM ? ?For on call review [email or web address].  ?

## 2021-09-14 NOTE — Progress Notes (Addendum)
? ? Advanced Heart Failure Rounding Note ? ?PCP-Cardiologist: [name], MD  ? ?Subjective:   ? ?5/2: Admitted w/ a/c CHF,  AKI on CKD and ? CAP.  ? ?Echo: EF 30-35% (stable), severe BAE, RV mod reduced, mod TR, Mod Pericardial Effusion w/o tamponade, IVC dilated, RVSP severely elevated 75 mmHg  ? ?ESR normal @ 5. CRP elevated at 31 ? ?RHC yesterday w/ elevated biventricular filling pressures w/ high CO in setting of known peripheral shunt (AVF) + severe mixed pulmonary HTN  ? ?RA = 11 (no significant v-waves) ?RV = 77/15 ?PA = 79/35 (55) ?PCW = 24 ?Fick cardiac output/index = 6.96/3.61 ?Thermo = 6.92/3.59 ?PVR = 4.5 WU ?FA sat = 99% ?PA sat = 71%, 77%, 79% ?SVC sat = 86% ? ?Rapid response activated overnight w/ increased dyspnea w/ increased WOB and hypoxia, O2 sats in 80s. Placed on HFNC @ 10L/min ?- STAT CXR w/ worsening CHF w/ increased vascular congestion. No other focal abnormality /infiltrates noted.  ?- WBC up 17>>21K  ?- PCT 1.23>>1.16>>1.25. Remains on abx for CAP.  Strep pneumo and Legionella negative ?- Hs trop 35>>45  ?- EKG NSR 93 bpm w/ LBBB (old)  ? ?It appears he did not get dose of Lasix last PM post cath  ? ?Scr trending down, 2.73>>2.40. Tacrolimus level pending  ?K 4.1  ? ?He is feeling better this morning. Off HFNC, reduced from 10L/min down to 2L/min currently w/ O2 sats ~94%. No increased WOB. + slight pleuritic chest pain. BP normotensive. +hiccups  ? ? ?Objective:   ?Weight Range: ?81.7 kg ?Body mass index is 28.21 kg/m?.  ? ?Vital Signs:   ?Temp:  [96.9 ?F (36.1 ?C)-98.5 ?F (36.9 ?C)] 98 ?F (36.7 ?C) (05/04 0932) ?Pulse Rate:  [73-87] 83 (05/04 0932) ?Resp:  [17-38] 20 (05/04 0932) ?BP: (103-132)/(62-87) 107/71 (05/04 0932) ?SpO2:  [77 %-100 %] 98 % (05/04 0932) ?Weight:  [81.7 kg] 81.7 kg (05/04 0535) ?Last BM Date : 09/16/2021 ? ?Weight change: ?Filed Weights  ? 09/30/2021 1959 10/11/2021 0549 09/14/21 0535  ?Weight: 83 kg 81.1 kg 81.7 kg  ? ? ?Intake/Output:  ? ?Intake/Output Summary  (Last 24 hours) at 09/14/2021 1106 ?Last data filed at 09/14/2021 1000 ?Gross per 24 hour  ?Intake 1532.27 ml  ?Output 875 ml  ?Net 657.27 ml  ?  ? ? ?Physical Exam  ? ?General:  fatigue appearing. No respiratory difficulty ?HEENT: normal ?Neck: supple. JVD elevated to jaw. Carotids 2+ bilat; no bruits. No lymphadenopathy or thyromegaly appreciated. ?Cor: PMI nondisplaced. Regular rate & rhythm. + friction rub LSB  ?Lungs: decreased BS at the bases  ?Abdomen: obse, soft, nontender, nondistended. No hepatosplenomegaly. No bruits or masses. Good bowel sounds. ?Extremities: no cyanosis, clubbing, rash, trace b/l LE edema ?Neuro: alert & oriented x 3, cranial nerves grossly intact. moves all 4 extremities w/o difficulty. Affect pleasant. ? ? ?Telemetry  ? ?NSR 70s  ? ?EKG  ?  ?NSR 93 bpm w/ LBBB  ? ?Labs  ?  ?CBC ?Recent Labs  ?  09/29/2021 ?1617 10/03/2021 ?2224 09/12/21 ?0408 09/21/2021 ?1329 10/04/2021 ?1334 09/14/21 ?0308  ?WBC 17.3*  --  17.7*  --   --  21.2*  ?NEUTROABS 14.9*  --   --   --   --  18.0*  ?HGB 13.9   < > 12.3*   < > 13.9 13.2  ?HCT 47.9   < > 39.8   < > 41.0 42.5  ?MCV 79.2*  --  78.0*  --   --  77.1*  ?PLT 408*  --  330  --   --  354  ? < > = values in this interval not displayed.  ? ?Basic Metabolic Panel ?Recent Labs  ?  10/01/2021 ?2202 10/03/2021 ?2224 09/12/21 ?0408 10/03/2021 ?8675 09/21/2021 ?1329 09/30/2021 ?1334 09/14/21 ?0308  ?NA  --    < > 131* 135   < > 138 133*  ?K  --    < > 4.4 3.8   < > 3.3* 4.1  ?CL  --   --  95* 98  --   --  99  ?CO2  --   --  23 24  --   --  19*  ?GLUCOSE  --   --  106* 106*  --   --  159*  ?BUN  --   --  43* 43*  --   --  49*  ?CREATININE  --   --  2.73* 2.40*  --   --  2.40*  ?CALCIUM  --   --  8.4* 8.3*  --   --  8.6*  ?MG 1.8  --  1.8  --   --   --   --   ?PHOS 4.8*  --  5.0*  --   --   --  4.5  ? < > = values in this interval not displayed.  ? ?Liver Function Tests ?Recent Labs  ?  09/12/21 ?0408 10/11/2021 ?0808 09/14/21 ?0308  ?AST 29 26  --   ?ALT 26 20  --   ?ALKPHOS 107 92  --    ?BILITOT 1.4* 1.3*  --   ?PROT 5.2* 4.7*  --   ?ALBUMIN 2.6* 2.3* 2.3*  ? ?No results for input(s): LIPASE, AMYLASE in the last 72 hours. ?Cardiac Enzymes ?Recent Labs  ?  09/14/2021 ?2202  ?CKTOTAL 37*  ? ? ?BNP: ?BNP (last 3 results) ?Recent Labs  ?  05/02/21 ?1234 09/12/2021 ?1617 09/14/21 ?0308  ?BNP 1,152.1* 1,553.9* 1,395.1*  ? ? ?ProBNP (last 3 results) ?No results for input(s): PROBNP in the last 8760 hours. ? ? ?D-Dimer ?No results for input(s): DDIMER in the last 72 hours. ?Hemoglobin A1C ?No results for input(s): HGBA1C in the last 72 hours. ?Fasting Lipid Panel ?No results for input(s): CHOL, HDL, LDLCALC, TRIG, CHOLHDL, LDLDIRECT in the last 72 hours. ?Thyroid Function Tests ?Recent Labs  ?  09/12/21 ?0408  ?TSH 4.881*  ? ? ?Other results: ? ? ?Imaging  ? ? ?CARDIAC CATHETERIZATION ? ?Result Date: 10/05/2021 ?Findings: RA = 11 (no significant v-waves) RV = 77/15 PA = 79/35 (55) PCW = 24 Fick cardiac output/index = 6.96/3.61 Thermo = 6.92/3.59 PVR = 4.5 WU FA sat = 99% PA sat = 71%, 77%, 79% SVC sat = 86% Assessment: 1. Elevated biventricular pressures with high cardiac output in setting of known peripheral shunt (AVF) 2. Severe mixed pulmonary HTN Plan/Discussion: Continue diuresis [name], MD 1:41 PM ? ?DG CHEST PORT 1 VIEW ? ?Result Date: 09/14/2021 ?CLINICAL DATA:  Difficulty breathing EXAM: PORTABLE CHEST 1 VIEW COMPARISON:  10/09/2021 FINDINGS: Cardiac shadow remains enlarged. Defibrillator is again seen. Increased vascular congestion is noted with mild edema consistent with congestive failure. No other focal abnormality is noted. IMPRESSION: Changes of congestive failure worsened in the interval from the prior exam Electronically Signed   By: Inez Catalina M.D.   On: 09/14/2021 00:48   ? ? ?Medications:   ? ? ?Scheduled Medications: ? amiodarone  100 mg Oral Daily  ? apixaban  5 mg Oral BID  ? atorvastatin  80 mg Oral QHS  ? clopidogrel  75 mg Oral Daily  ? colchicine  0.6 mg Oral Daily  ?  famotidine  40 mg Oral QHS  ? feeding supplement  237 mL Oral TID BM  ? fluticasone  2 spray Each Nare Daily  ? guaiFENesin  600 mg Oral BID  ? mexiletine  300 mg Oral BID  ? multivitamin with minerals  1 tablet Oral Daily  ? mycophenolate  750 mg Oral BID  ? pantoprazole  40 mg Oral Daily  ? PARoxetine  40 mg Oral QPM  ? predniSONE  5 mg Oral Daily  ? sodium chloride flush  3 mL Intravenous Q12H  ? sodium chloride flush  3 mL Intravenous Q12H  ? sodium chloride flush  3 mL Intravenous Q12H  ? tacrolimus ER  0.75 mg Oral Daily  ? urea   Topical BID  ? ? ?Infusions: ? sodium chloride    ? sodium chloride    ? azithromycin 500 mg (09/26/2021 2133)  ? cefTRIAXone (ROCEPHIN)  IV 2 g (09/24/2021 2030)  ? ? ?PRN Medications: ?sodium chloride, sodium chloride, acetaminophen, albuterol, clonazepam, HYDROcodone-acetaminophen, melatonin, ondansetron (ZOFRAN) IV, ondansetron **OR** [DISCONTINUED] ondansetron (ZOFRAN) IV, polyethylene glycol, sodium chloride flush, sodium chloride flush, traMADol ? ? ? ?Patient Profile  ? ?56 y/o male w/ chronic systolic heart failure due to ICM, CAD, h/o pericarditis, Stage IIIa CKD s/p renal transplant in 2012 due to SLE, PAF/AFL and frequent PVCs admitted for a/c CHF.  ?  ? ?Assessment/Plan  ? ?Acute on Chronic Biventricular Heart Failure ?- HX ICD in 2012 ?- Echo 12/17 EF 30%  ?- Cath 04/22/20 for worsening HF symptoms. Stable CAD. ?- Echo 6/22 EF 30-35%, Grade III DD, severe LAE, mild/mod TR ?- Echo 02/04/21: EF 30-35% No effusion, RV normal. ?- Echo 3/23: EF 35%, RV mod reduced, severe TR ?- Admitted w/ a/c CHF w/  worsening NYHA Functional Class IIIb and fluid overload w/ AKI on CKD  ?- Echo EF 30-35% (stable), severe BAE, RV mod reduced, mod TR, Mod Pericardial Effusion w/o tamponade, IVC dilated, RVSP severely elevated 75 mmHg  ?- RHC w/ elevated biventricular filling pressures w/ high CO in setting of known peripheral shunt (AVF) + severe mixed pulmonary HTN, RA 11, PCWP 24 ?-Continue  diuresis w/ IV Lasix 80 mg bid. D/w nephrology ok to give 5 mg of metolazone today   ?-GDMT limited by CKD ?-No Imdur/hydral given AKI no CKD and need to avoid hypotension  ?-D/w nephrology potential AVF ligatio

## 2021-09-14 NOTE — Significant Event (Addendum)
Rapid Response Event Note  ? ?Reason for Call : Hypoxia and dyspnea after being up ?Initial Focused Assessment:  ?Called by nursing staff regarding pt with sats in the 80s and c/o SOB. Upon arrival, pt is alert, oriented and states in 3-4 word phrases that this is worse than how SOB he normally gets after activity. He stated he felt like he was going to pass out. Pt placed on Salter HF at 10L and his sats are 94-95%. Moderate increased WOB with some abdominal accessory muscle use. No retractions. Skin is pale, warm and diaphoretic. BBS rales in left posterior base. PCXR previously ordered for follow up so Radiology notified to be taken now. Pt was wearing CPAP prior to ambulating and does not want to put it on right now. EKG shows baseline 1st degree HB around 90. Pt is on Lasix 80 mg BID however only one dose today about noon d/t [place] rising (2.4).  EF 5/2 30-35%.  ? ?0030-HR 87 SR with 1st degree HB, 131/87 (100), RR 22 with sats 94-97% on Salter HFNC at 10L.  ? ? ?Interventions:  ?-EKG ?-PCXR ? ?Plan of Care:  ?-May need NIPPV if WOB does not improve.  ?-Notify MD for further orders ?-Notify MD and/or RRRN for any further assistance ? ? ? ?MD Notified: Dr. Cyd Silence per primary RN ?Call Time: 0023 ?Arrival Time: 0028 ?End Time: 0107 ? ?Madelynn Done, RN ?

## 2021-09-14 NOTE — Progress Notes (Signed)
Pt self administers home CPAP. 

## 2021-09-14 NOTE — Progress Notes (Signed)
Patient ID: OBE AHLERS, male   DOB: March 17, 1966, 56 y.o.   MRN: [id number] ?[facility] ?Progress Note  ? ?Assessment/ Plan:   ?1. 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  Acute worsening suspected to be related to exacerbation of congestive heart failure.  Overnight with unchanged renal function after what appears to be acute worsening of respiratory status/hypoxemia and fixed urine output.  Diuretics restarted this morning and would favor addition of metolazone 5 mg to furosemide 80 mg IV twice daily to see if we can augment volume unloading. ?2.  Acute exacerbation of congestive heart failure: Right heart catheterization yesterday showed elevated biventricular pressures with high cardiac output and severe mixed pulmonary hypertension.  Unfortunately he missed his p.m. dose of furosemide which has been thereafter restarted at 80 mg IV twice daily.  Discussed with heart failure service to add metolazone 5 mg x 1 dose today.  May need ligation of his left brachiocephalic fistula that may be contributing to his high-output heart failure. ?3.  Status post cadaveric kidney transplant: Continue maintenance immunosuppressive therapy with Envarsus, mycophenolate and prednisone.  Optimizing volume status with diuretics in the setting of CHF exacerbation. ?4.  Hyponatremia: Secondary to CHF exacerbation and acute kidney injury, monitor with ongoing diuretics. ? ?Subjective:   ?Rapid response event noted overnight with worsening hypoxemia and presyncopal event after ambulating to bathroom and attempting bowel movement.  ? ?Objective:   ?BP 107/71 (BP Location: Right Arm)   Pulse 83   Temp 98 ?F (36.7 ?C) (Oral)   Resp 20   Ht '5\' 7"'$  (1.702 m)   Wt 81.7 kg   SpO2 98%   BMI 28.21 kg/m?  ? ?Intake/Output Summary (Last 24 hours) at 09/14/2021  1150 ?Last data filed at 09/14/2021 1000 ?Gross per 24 hour  ?Intake 1532.27 ml  ?Output 875 ml  ?Net 657.27 ml  ? ?Weight change: 0.635 kg ? ?Physical Exam: ?Gen: Comfortably resting in bed on oxygen via nasal cannula, wife at bedside ?CVS: Pulse regular rhythm and normal rate.  Pericardial rub audible over the precordium ?Resp: Diminished breath sounds over bases with fine rales left mid lung field. ?Abd: Soft, flat, nontender, bowel sounds normal ?Ext: No lower extremity edema ? ?Imaging: ?CARDIAC CATHETERIZATION ? ?Result Date: 09/12/2021 ?Findings: RA = 11 (no significant v-waves) RV = 77/15 PA = 79/35 (55) PCW = 24 Fick cardiac output/index = 6.96/3.61 Thermo = 6.92/3.59 PVR = 4.5 WU FA sat = 99% PA sat = 71%, 77%, 79% SVC sat = 86% Assessment: 1. Elevated biventricular pressures with high cardiac output in setting of known peripheral shunt (AVF) 2. Severe mixed pulmonary HTN Plan/Discussion: Continue diuresis [name], MD 1:41 PM ? ?DG CHEST PORT 1 VIEW ? ?Result Date: 09/14/2021 ?CLINICAL DATA:  Difficulty breathing EXAM: PORTABLE CHEST 1 VIEW COMPARISON:  09/12/2021 FINDINGS: Cardiac shadow remains enlarged. Defibrillator is again seen. Increased vascular congestion is noted with mild edema consistent with congestive failure. No other focal abnormality is noted. IMPRESSION: Changes of congestive failure worsened in the interval from the prior exam Electronically Signed   By: Inez Catalina M.D.   On: 09/14/2021 00:48  ? ?ECHOCARDIOGRAM COMPLETE ? ?Result Date: 09/12/2021 ?   ECHOCARDIOGRAM REPORT   Patient Name:   TYMEL CONELY Date of Exam: 09/12/2021 Medical Rec #:  [id number]    Height:  67.0 in Accession #:    [id number]   Weight:       183.0 lb Date of Birth:  04-Mar-1966    BSA:          1.947 m? Patient Age:    43 years     BP:           117/66 mmHg Patient Gender: M            HR:           79 bpm. Exam Location:  Inpatient Procedure: 2D Echo, Cardiac Doppler, Color Doppler and Intracardiac             Opacification Agent Indications:    Elevated troponin  History:        Patient has prior history of Echocardiogram examinations. CHF,                 CAD, Arrythmias:Atrial Flutter; Risk Factors:Hypertension and                 Sleep Apnea.  Sonographer:    Jyl Heinz Referring Phys: [place]  1. Left ventricular ejection fraction, by estimation, is 30 to 35%. The left ventricle has moderately decreased function. The left ventricle demonstrates global hypokinesis. The left ventricular internal cavity size was severely dilated. There is mild left ventricular hypertrophy. Left ventricular diastolic parameters are consistent with Grade II diastolic dysfunction (pseudonormalization). Elevated left atrial pressure.  2. Right ventricular systolic function is mildly reduced. The right ventricular size is moderately enlarged. Moderately increased right ventricular wall thickness. There is severely elevated pulmonary artery systolic pressure. The estimated right ventricular systolic pressure is 23.3 mmHg.  3. Left atrial size was severely dilated.  4. Right atrial size was severely dilated.  5. Moderate pericardial effusion. IVC fixed/dilated, but no RA/RV collapse seen to suggest tamponade  6. The mitral valve is normal in structure. Mild mitral valve regurgitation. No evidence of mitral stenosis.  7. Tricuspid valve regurgitation is moderate.  8. The aortic valve is tricuspid. Aortic valve regurgitation is trivial. No aortic stenosis is present.  9. The inferior vena cava is dilated in size with <50% respiratory variability, suggesting right atrial pressure of 15 mmHg. FINDINGS  Left Ventricle: Left ventricular ejection fraction, by estimation, is 30 to 35%. The left ventricle has moderately decreased function. The left ventricle demonstrates global hypokinesis. Definity contrast agent was given IV to delineate the left ventricular endocardial borders. The left ventricular internal cavity size  was severely dilated. There is mild left ventricular hypertrophy. Left ventricular diastolic parameters are consistent with Grade II diastolic dysfunction (pseudonormalization). Elevated left atrial pressure. Right Ventricle: The right ventricular size is moderately enlarged. Moderately increased right ventricular wall thickness. Right ventricular systolic function is mildly reduced. There is severely elevated pulmonary artery systolic pressure. The tricuspid  regurgitant velocity is 3.88 m/s, and with an assumed right atrial pressure of 15 mmHg, the estimated right ventricular systolic pressure is 00.7 mmHg. Left Atrium: Left atrial size was severely dilated. Right Atrium: Right atrial size was severely dilated. Pericardium: A moderately sized pericardial effusion is present. Mitral Valve: The mitral valve is normal in structure. Mild mitral valve regurgitation. No evidence of mitral valve stenosis. Tricuspid Valve: The tricuspid valve is normal in structure. Tricuspid valve regurgitation is moderate. Aortic Valve: The aortic valve is tricuspid. Aortic valve regurgitation is trivial. No aortic stenosis is present. Aortic valve peak gradient measures 9.6 mmHg. Pulmonic Valve: The pulmonic valve was normal in  structure. Pulmonic valve regurgitation is trivial. Aorta: The aortic root and ascending aorta are structurally normal, with no evidence of dilitation. Venous: The inferior vena cava is dilated in size with less than 50% respiratory variability, suggesting right atrial pressure of 15 mmHg. IAS/Shunts: The interatrial septum was not well visualized.  LEFT VENTRICLE PLAX 2D LVIDd:         6.80 cm      Diastology LVIDs:         5.70 cm      LV e' medial:    4.90 cm/s LV PW:         1.20 cm      LV E/e' medial:  15.1 LV IVS:        1.20 cm      LV e' lateral:   7.51 cm/s LVOT diam:     2.20 cm      LV E/e' lateral: 9.9 LV SV:         59 LV SV Index:   30 LVOT Area:     3.80 cm?                              3D Volume  EF: LV Volumes (MOD)            3D EF:        46 % LV vol d, MOD A2C: 230.0 ml LV EDV:       311 ml LV vol d, MOD A4C: 235.0 ml LV ESV:       167 ml LV vol s, MOD A2C: 161.0 ml LV SV:        144 ml LV vol s, MOD

## 2021-09-14 NOTE — Progress Notes (Signed)
Pt has urinated only 175 ml in my shift even after getting IV lasix and Metolazone. Pt stated Dr. Haroldine Laws aware. ?

## 2021-09-14 NOTE — Progress Notes (Signed)
Patient using the bathroom and called c/o nausea ,abdl pain stated feel like having diarrhea , stool soft and formed , assisited back in bed , c/o SOB , kept on O2 2l , noted desaturating to 80's o2 increase fr 4l -8l Patient stated not feeling well., noted diaphoretic , denies cp , c/o  abdominal pain , described"cramp like "RR team activated , charge nurse at [facility]. Zofran  given for nausea , no vomiting noted., respiratory notified ?EKG , CXR done at [facility], cbg 131. ?Dr Marlyce Huge paged/notified. ? Vital signs monitored OP2 maintained 92-95% ?

## 2021-09-15 DIAGNOSIS — N189 Chronic kidney disease, unspecified: Secondary | ICD-10-CM | POA: Diagnosis not present

## 2021-09-15 DIAGNOSIS — I5043 Acute on chronic combined systolic (congestive) and diastolic (congestive) heart failure: Secondary | ICD-10-CM | POA: Diagnosis not present

## 2021-09-15 DIAGNOSIS — J189 Pneumonia, unspecified organism: Secondary | ICD-10-CM | POA: Diagnosis not present

## 2021-09-15 DIAGNOSIS — J9601 Acute respiratory failure with hypoxia: Secondary | ICD-10-CM | POA: Diagnosis not present

## 2021-09-15 DIAGNOSIS — N179 Acute kidney failure, unspecified: Secondary | ICD-10-CM | POA: Diagnosis not present

## 2021-09-15 DIAGNOSIS — I5082 Biventricular heart failure: Secondary | ICD-10-CM | POA: Diagnosis not present

## 2021-09-15 DIAGNOSIS — I10 Essential (primary) hypertension: Secondary | ICD-10-CM | POA: Diagnosis not present

## 2021-09-15 LAB — BASIC METABOLIC PANEL
Anion gap: 12 (ref 5–15)
BUN: 61 mg/dL — ABNORMAL HIGH (ref 6–20)
CO2: 23 mmol/L (ref 22–32)
Calcium: 8.5 mg/dL — ABNORMAL LOW (ref 8.9–10.3)
Chloride: 98 mmol/L (ref 98–111)
Creatinine, Ser: 3.48 mg/dL — ABNORMAL HIGH (ref 0.61–1.24)
GFR, Estimated: 20 mL/min — ABNORMAL LOW (ref >60)
Glucose, Bld: 120 mg/dL — ABNORMAL HIGH (ref 70–99)
Potassium: 4.6 mmol/L (ref 3.5–5.1)
Sodium: 133 mmol/L — ABNORMAL LOW (ref 135–145)

## 2021-09-15 LAB — CBC
HCT: 39.5 % (ref 39.0–52.0)
Hemoglobin: 12.2 g/dL — ABNORMAL LOW (ref 13.0–17.0)
MCH: 24.4 pg — ABNORMAL LOW (ref 26.0–34.0)
MCHC: 30.9 g/dL (ref 30.0–36.0)
MCV: 78.8 fL — ABNORMAL LOW (ref 80.0–100.0)
Platelets: 299 10*3/uL (ref 150–400)
RBC: 5.01 MIL/uL (ref 4.22–5.81)
RDW: 18.5 % — ABNORMAL HIGH (ref 11.5–15.5)
WBC: 16.2 10*3/uL — ABNORMAL HIGH (ref 4.0–10.5)
nRBC: 0 % (ref 0.0–0.2)

## 2021-09-15 LAB — LIPOPROTEIN A (LPA): Lipoprotein (a): 33 nmol/L — ABNORMAL HIGH (ref <75.0)

## 2021-09-15 MED ORDER — BACLOFEN 10 MG PO TABS
5.0000 mg | ORAL_TABLET | Freq: Once | ORAL | Status: AC
  Administered 2021-09-15: 5 mg via ORAL
  Filled 2021-09-15: qty 1

## 2021-09-15 MED ORDER — BACLOFEN 10 MG PO TABS
5.0000 mg | ORAL_TABLET | Freq: Three times a day (TID) | ORAL | Status: DC | PRN
  Administered 2021-09-15: 5 mg via ORAL
  Filled 2021-09-15: qty 1

## 2021-09-15 MED ORDER — MIDODRINE HCL 5 MG PO TABS
5.0000 mg | ORAL_TABLET | Freq: Three times a day (TID) | ORAL | Status: DC
  Administered 2021-09-15 – 2021-09-19 (×15): 5 mg via ORAL
  Filled 2021-09-15 (×15): qty 1

## 2021-09-15 MED ORDER — FUROSEMIDE 10 MG/ML IJ SOLN
15.0000 mg/h | INTRAVENOUS | Status: DC
  Administered 2021-09-15 – 2021-09-16 (×3): 15 mg/h via INTRAVENOUS
  Filled 2021-09-15 (×4): qty 20

## 2021-09-15 MED ORDER — DOBUTAMINE IN D5W 4-5 MG/ML-% IV SOLN
3.0000 ug/kg/min | INTRAVENOUS | Status: DC
  Administered 2021-09-15: 3 ug/kg/min via INTRAVENOUS
  Filled 2021-09-15: qty 250

## 2021-09-15 NOTE — Progress Notes (Signed)
HOSPITAL MEDICINE OVERNIGHT EVENT NOTE   ? ?Nursing reports the patient has been complaining of ongoing hiccups throughout the evening shift which have been quite disruptive for the patient. ? ?Chart reviewed, patient is already on daily PPI.  We will go ahead and order a one-time dose of 5 mg of baclofen and reassess. ? ?Vernelle Emerald  MD ?[facility]  ? ? ? ? ? ? ? ? ? ? ?

## 2021-09-15 NOTE — Progress Notes (Signed)
Pt had a total urine output of 100 cc  overnight. Bladder scanned showing 56 ml. Dr. Ples Specter ( Cardiology on call) and Dr. Cyd Silence (hospitalist on call) notified. Will continue to monitor pt.  ?

## 2021-09-15 NOTE — Progress Notes (Signed)
Patient ID: Roy Dyer, male   DOB: [birth year], 56 y.o.   MRN: [id number] ?[facility] ?Progress Note  ? ?Assessment/ Plan:   ?1. 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 Acute worsening suspected to be related to exacerbation of congestive heart failure. Now oliguric with minimal UOP (275cc's) over past 24hrs despite IV Lasix '80mg'$  BID and Metolazone '5mg'$  yesterday. Renal function significantly worse this morning (Cr 3.48 from 2.40). Continue aggressive management of HFrEF as below and close monitoring of renal function. Suspect he will need CRRT this hospitalization. Likely unable to tolerate dialysis at the present time due to soft BP. Remains quite tenuous overall. ?2.  Acute exacerbation of congestive heart failure: Right heart catheterization showed elevated biventricular pressures with high cardiac output and severe mixed pulmonary hypertension.  Now on milrinone per cardiology in addition to Lasix '80mg'$  IV BID and metolazone. ?No plans for ligation of his left brachiocephalic fistula that may be contributing to his high-output heart failure. ?3. Hypotension ?BP remains soft at 90s/60s this morning. Started in midodrine per primary team. ?3.  Status post cadaveric kidney transplant: Continue maintenance immunosuppressive therapy with Envarsus, mycophenolate and prednisone.   ?4.  Hyponatremia: Secondary to CHF exacerbation and acute kidney injury, monitor with ongoing diuretics. ? ?Subjective:   ?Patient with minimal urine output. Otherwise denies complaints this morning.  ? ?Objective:   ?BP 94/64   Pulse 76   Temp 97.6 ?F (36.4 ?C) (Oral)   Resp (!) 21   Ht '5\' 7"'$  (1.702 m)   Wt 82.7 kg   SpO2 98%   BMI 28.55 kg/m?  ? ?Intake/Output Summary (Last 24 hours) at 09/15/2021 0751 ?Last data filed at 09/14/2021 2223 ?Gross per 24  hour  ?Intake 1212.19 ml  ?Output 275 ml  ?Net 937.19 ml  ? ?Weight change: 0.998 kg ? ?Physical Exam: ?Gen: alert, resting comfortably in bed ?CVS: RRR, II/VI pansystolic murmur, +pericardial rub ?Resp: mildly tachypneic ?Abd: Soft, nontender, nondistended ?Ext: No lower extremity edema ? ?Imaging: ?CARDIAC CATHETERIZATION ? ?Result Date: 10/05/2021 ?Findings: RA = 11 (no significant v-waves) RV = 77/15 PA = 79/35 (55) PCW = 24 Fick cardiac output/index = 6.96/3.61 Thermo = 6.92/3.59 PVR = 4.5 WU FA sat = 99% PA sat = 71%, 77%, 79% SVC sat = 86% Assessment: 1. Elevated biventricular pressures with high cardiac output in setting of known peripheral shunt (AVF) 2. Severe mixed pulmonary HTN Plan/Discussion: Continue diuresis Roy Bickers, MD 1:41 PM ? ?DG CHEST PORT 1 VIEW ? ?Result Date: 09/14/2021 ?CLINICAL DATA:  Difficulty breathing EXAM: PORTABLE CHEST 1 VIEW COMPARISON:  10/08/2021 FINDINGS: Cardiac shadow remains enlarged. Defibrillator is again seen. Increased vascular congestion is noted with mild edema consistent with congestive failure. No other focal abnormality is noted. IMPRESSION: Changes of congestive failure worsened in the interval from the prior exam Electronically Signed   By: Inez Catalina M.D.   On: 09/14/2021 00:48   ? ?Labs: ?BMET ?Recent Labs  ?Lab 09/30/2021 ?1617 10/11/2021 ?2202 09/26/2021 ?2224 09/12/21 ?0408 10/09/2021 ?1749 09/16/2021 ?1329 10/10/2021 ?1333 10/09/2021 ?1334 09/14/21 ?0308  ?NA 133*  --  132* 131* 135 136 137 138 133*  ?K 4.1  --  3.9 4.4 3.8 3.6 3.4* 3.3* 4.1  ?CL 96*  --   --  95* 98  --   --   --  99  ?  CO2 22  --   --  23 24  --   --   --  19*  ?GLUCOSE 115*  --   --  106* 106*  --   --   --  159*  ?BUN 42*  --   --  43* 43*  --   --   --  49*  ?CREATININE 2.72*  --   --  2.73* 2.40*  --   --   --  2.40*  ?CALCIUM 9.2  --   --  8.4* 8.3*  --   --   --  8.6*  ?PHOS  --  4.8*  --  5.0*  --   --   --   --  4.5  ? ?CBC ?Recent Labs  ?Lab 10/05/2021 ?1617 10/03/2021 ?2224 09/12/21 ?0408  10/02/2021 ?1329 09/29/2021 ?1333 10/11/2021 ?1334 09/14/21 ?0308  ?WBC 17.3*  --  17.7*  --   --   --  21.2*  ?NEUTROABS 14.9*  --   --   --   --   --  18.0*  ?HGB 13.9   < > 12.3* 14.6 14.3 13.9 13.2  ?HCT 47.9   < > 39.8 43.0 42.0 41.0 42.5  ?MCV 79.2*  --  78.0*  --   --   --  77.1*  ?PLT 408*  --  330  --   --   --  354  ? < > = values in this interval not displayed.  ? ? ?Medications:   ? ? amiodarone  100 mg Oral Daily  ? amoxicillin-clavulanate  1 tablet Oral Q12H  ? apixaban  5 mg Oral BID  ? atorvastatin  80 mg Oral QHS  ? azithromycin  500 mg Oral Daily  ? clopidogrel  75 mg Oral Daily  ? colchicine  0.6 mg Oral Daily  ? famotidine  40 mg Oral QHS  ? feeding supplement  237 mL Oral TID BM  ? fluticasone  2 spray Each Nare Daily  ? furosemide  80 mg Intravenous BID  ? guaiFENesin  600 mg Oral BID  ? mexiletine  300 mg Oral BID  ? multivitamin with minerals  1 tablet Oral Daily  ? mycophenolate  750 mg Oral BID  ? pantoprazole  40 mg Oral Daily  ? PARoxetine  40 mg Oral QPM  ? predniSONE  5 mg Oral Daily  ? sodium chloride flush  3 mL Intravenous Q12H  ? sodium chloride flush  3 mL Intravenous Q12H  ? sodium chloride flush  3 mL Intravenous Q12H  ? tacrolimus ER  0.75 mg Oral Daily  ? urea   Topical BID  ? ?Alcus Dad, MD ?09/15/2021, 7:51 AM  ? ?

## 2021-09-15 NOTE — Progress Notes (Addendum)
? ? Advanced Heart Failure Rounding Note ? ?PCP-Cardiologist: [name], MD  ? ?Subjective:   ? ?5/2: Admitted w/ a/c CHF,  AKI on CKD and ? CAP.  ? ?Echo: EF 30-35% (stable), severe BAE, RV mod reduced, mod TR, Mod Pericardial Effusion w/o tamponade, IVC dilated, RVSP severely elevated 75 mmHg  ? ?ESR normal @ 5. CRP elevated at 31 ? ?5/3 RHC w/ elevated biventricular filling pressures w/ high CO in setting of known peripheral shunt (AVF) + severe mixed pulmonary HTN  ?5/4 Diuresed with IV lasix 80 mg x2 + 5 mg metolazone. Only 275 cc  urine output..  ? ?Remains on milrinone 0.125 mcg.  ? ?Creatinine 2.73>>2.40>> pending.  Tacrolimus level 12.9  ? ? ?Denies SOB. Denies chest pain.  ? ? ?Objective:   ?Weight Range: ?82.7 kg ?Body mass index is 28.55 kg/m?.  ? ?Vital Signs:   ?Temp:  [96.5 ?F (35.8 ?C)-98 ?F (36.7 ?C)] 96.5 ?F (35.8 ?C) (05/05 0542) ?Pulse Rate:  [76-87] 76 (05/05 0542) ?Resp:  [20-22] 21 (05/05 0542) ?BP: (91-118)/(59-80) 94/64 (05/05 0551) ?SpO2:  [87 %-99 %] 98 % (05/05 0551) ?Weight:  [82.7 kg] 82.7 kg (05/05 0542) ?Last BM Date : 09/14/21 ? ?Weight change: ?Filed Weights  ? 09/17/2021 0549 09/14/21 0535 09/15/21 0542  ?Weight: 81.1 kg 81.7 kg 82.7 kg  ? ? ?Intake/Output:  ? ?Intake/Output Summary (Last 24 hours) at 09/15/2021 0724 ?Last data filed at 09/14/2021 2223 ?Gross per 24 hour  ?Intake 1212.19 ml  ?Output 275 ml  ?Net 937.19 ml  ?  ? ? ?Physical Exam  ?General:  Appears weak. No resp difficulty ?HEENT: normal ?Neck: supple. JVP 11-12  Carotids 2+ bilat; no bruits. No lymphadenopathy or thryomegaly appreciated. ?Cor: PMI nondisplaced. Regular rate & rhythm. No rubs, gallops or murmurs. ?Lungs: clear ?Abdomen: soft, nontender, nondistended. No hepatosplenomegaly. No bruits or masses. Good bowel sounds. ?Extremities: no cyanosis, clubbing, rash, edema ?Neuro: alert & orientedx3, cranial nerves grossly intact. moves all 4 extremities w/o difficulty. Affect pleasant ? ? ? ?Telemetry  ?SR  70-80s  ? ?EKG  ?  ?NSR 93 bpm w/ LBBB  ? ?Labs  ?  ?CBC ?Recent Labs  ?  09/26/2021 ?1334 09/14/21 ?0308  ?WBC  --  21.2*  ?NEUTROABS  --  18.0*  ?HGB 13.9 13.2  ?HCT 41.0 42.5  ?MCV  --  77.1*  ?PLT  --  354  ? ?Basic Metabolic Panel ?Recent Labs  ?  09/20/2021 ?0808 09/25/2021 ?1329 09/16/2021 ?1334 09/14/21 ?0308  ?NA 135   < > 138 133*  ?K 3.8   < > 3.3* 4.1  ?CL 98  --   --  99  ?CO2 24  --   --  19*  ?GLUCOSE 106*  --   --  159*  ?BUN 43*  --   --  49*  ?CREATININE 2.40*  --   --  2.40*  ?CALCIUM 8.3*  --   --  8.6*  ?PHOS  --   --   --  4.5  ? < > = values in this interval not displayed.  ? ?Liver Function Tests ?Recent Labs  ?  09/29/2021 ?0808 09/14/21 ?0308  ?AST 26  --   ?ALT 20  --   ?ALKPHOS 92  --   ?BILITOT 1.3*  --   ?PROT 4.7*  --   ?ALBUMIN 2.3* 2.3*  ? ?No results for input(s): LIPASE, AMYLASE in the last 72 hours. ?Cardiac Enzymes ?No results for input(s): CKTOTAL,  CKMB, CKMBINDEX, TROPONINI in the last 72 hours. ? ? ?BNP: ?BNP (last 3 results) ?Recent Labs  ?  05/02/21 ?1234 10/07/2021 ?1617 09/14/21 ?0308  ?BNP 1,152.1* 1,553.9* 1,395.1*  ? ? ?ProBNP (last 3 results) ?No results for input(s): PROBNP in the last 8760 hours. ? ? ?D-Dimer ?No results for input(s): DDIMER in the last 72 hours. ?Hemoglobin A1C ?No results for input(s): HGBA1C in the last 72 hours. ?Fasting Lipid Panel ?No results for input(s): CHOL, HDL, LDLCALC, TRIG, CHOLHDL, LDLDIRECT in the last 72 hours. ?Thyroid Function Tests ?No results for input(s): TSH, T4TOTAL, T3FREE, THYROIDAB in the last 72 hours. ? ?Invalid input(s): FREET3 ? ? ?Other results: ? ? ?Imaging  ? ? ?No results found. ? ? ?Medications:   ? ? ?Scheduled Medications: ? amiodarone  100 mg Oral Daily  ? amoxicillin-clavulanate  1 tablet Oral Q12H  ? apixaban  5 mg Oral BID  ? atorvastatin  80 mg Oral QHS  ? azithromycin  500 mg Oral Daily  ? clopidogrel  75 mg Oral Daily  ? colchicine  0.6 mg Oral Daily  ? famotidine  40 mg Oral QHS  ? feeding supplement  237 mL Oral TID  BM  ? fluticasone  2 spray Each Nare Daily  ? furosemide  80 mg Intravenous BID  ? guaiFENesin  600 mg Oral BID  ? mexiletine  300 mg Oral BID  ? multivitamin with minerals  1 tablet Oral Daily  ? mycophenolate  750 mg Oral BID  ? pantoprazole  40 mg Oral Daily  ? PARoxetine  40 mg Oral QPM  ? predniSONE  5 mg Oral Daily  ? sodium chloride flush  3 mL Intravenous Q12H  ? sodium chloride flush  3 mL Intravenous Q12H  ? sodium chloride flush  3 mL Intravenous Q12H  ? tacrolimus ER  0.75 mg Oral Daily  ? urea   Topical BID  ? ? ?Infusions: ? sodium chloride    ? sodium chloride    ? milrinone 0.125 mcg/kg/min (09/14/21 1957)  ? ? ?PRN Medications: ?sodium chloride, sodium chloride, acetaminophen, albuterol, clonazepam, HYDROcodone-acetaminophen, melatonin, ondansetron (ZOFRAN) IV, ondansetron **OR** [DISCONTINUED] ondansetron (ZOFRAN) IV, polyethylene glycol, sodium chloride flush, sodium chloride flush, traMADol ? ? ? ?Patient Profile  ? ?56 y/o male w/ chronic systolic heart failure due to ICM, CAD, h/o pericarditis, Stage IIIa CKD s/p renal transplant in 2012 due to SLE, PAF/AFL and frequent PVCs admitted for a/c CHF.  ?  ? ?Assessment/Plan  ? ?Acute on Chronic Biventricular Heart Failure ?- HX ICD in 2012 ?- Echo 12/17 EF 30%  ?- Cath 04/22/20 for worsening HF symptoms. Stable CAD. ?- Echo 6/22 EF 30-35%, Grade III DD, severe LAE, mild/mod TR ?- Echo 02/04/21: EF 30-35% No effusion, RV normal. ?- Echo 3/23: EF 35%, RV mod reduced, severe TR ?- Admitted w/ a/c CHF w/  worsening NYHA Functional Class IIIb and fluid overload w/ AKI on CKD  ?- Echo EF 30-35% (stable), severe BAE, RV mod reduced, mod TR, Mod Pericardial Effusion w/o tamponade, IVC dilated, RVSP severely elevated 75 mmHg  ?- RHC w/ elevated biventricular filling pressures w/ high CO in setting of known peripheral shunt (AVF) + severe mixed pulmonary HTN, RA 11, PCWP 24 ?- On empiric milrinone. Poor diuresis with IV lasix + metolazone.  ?-SBP in the 90s.  Add midodrine 5 mg tid.  ?-GDMT limited by CKD/ hypotension ?-No Imdur/hydral to avoid hypotension  ?-?Potential AVF ligation given high output HF ?- BMET  pending.  ? ?2. Pericardial Effusion ?- moderate on echo. No tamponade. BP stable  ?- Suspect 2/2 pericarditis + CHF  ?- Continue diuresis w/ IV Lasix ?- repeat limited echo in several days to reassess size  ?  ?3. Mod- Severe Tricuspid Regurgitation  ?- Recent progression, Echo 9/22 TR moderate ?- Echo 3/23 TR Severe  ?-Mod on Echo this admit  ?- functional in setting of dilated RV ?- no significant v-waves on RHC  ?- HF optimization per above ?  ?4. Pulmonary HTN  ?- Echo 3/23 w/ severely elevated RVSP, 68 mmHg and severe TR.  ?- Echo this admit RVSP 75  ?- Combination WHO Groups 2+3.  ?- Given H/o SLE ? WHO Group 1 component  ?- RHC w/ severe mixed pulmonary HTN, PVR = 4.5 WU ?- V/Q scan 9/22 Negative for PE  ?- on CPAP for OSA. Reports intermittent compliance  ?  ?5. AKI no CKD Stage IIIa S/p  Renal transplant in 2012 (due to SLE): ?- Follows with nephrology, Dr. Clover Mealy and Dr. Lissa Merlin at [facility] ?- Baseline Creatinine 1.4-1.7 ?- Recent Scr 2.0 at [facility] 04/13/21 ?- SCr 2.73 on admit>>2.4>>3.5.  Follow closely w/ diuresis  ?- Will continue to hold Entresto. Continue Farxiga. May need to stop.  ?- Continue prednisone, Prograf and CellCept.  ?- Tacrolimus level 12.9  ? ?6. ? PNA  ?- WBC elevated 17>21K>pending - ? PNA on CXR ?- PCT 1.23 >>1.16 >1.25 ?- on empiric abx per IM ?  ?7. CAD ?- Multiple stents to LAD, RCA and LCX ?- Cath 04/22/20 for worsening HF symptoms with stable disease ?- No ASA given Eliquis  ?- Continue Plavix + statin. ?- No chest pain.  ?- HS trop flat and low level, 43>>36>>35  ?  ?8. PAF/AFL  ?- no candidate for Ablation per EP given severe LAE (5.8 cm)  ?- in NSR currently  ?- continue amio 100 mg daily (reduced dose due to GI SE)  ?- Eliquis for a/c, 5 mg bid  ?  ?9. H/o PVCs  ?- Prior Zio w/ high burden, 11% ?- Burden reduced with amio and  Mexiletine .  ?- continue AAD therapy  ?  ?9. Recurrent Pericarditis ?- on chronic colchicine ?- exam + friction rub + recent pleuritic CP 3 wks ago ?- ESR normal, CRP elevated at 31  ?- mod effusion on ech

## 2021-09-15 NOTE — Progress Notes (Signed)
?Progress Note ? ? ?Patient: Roy Dyer VZC:[id number] DOB: 09/05/1965 DOA: 10/10/2021     4 ?DOS: the patient was seen and examined on 09/15/2021 ?  ?Brief hospital course: ?56 year old gentleman with history of CKD stage IIIb, lupus status post renal transplant, coronary artery disease status post multiple stents, ischemic cardiomyopathy, AICD, hypertension, a flutter and prediabetes admitted with worsening shortness of breath, found to have AKI as well as fluid overload.  Baseline creatinine about 1.5-1.7.  Hypoxemic on arrival.  Chest x-ray with cardiovascular prominence.  Admitted with nephrology and cardiology consultations. ?  ?Placed on aggressive diuresis. ? ?05/03 cardiac catheterization with elevated filling pressures.  ? ?Assessment and Plan: ?Acute on chronic combined systolic and diastolic CHF (congestive heart failure) ([place]) ?Acute hypoxemic respiratory failure.  ? ?Echocardiogram with reduced LV systolic function with EF 30 to 35%, global hypokinesis, mild reduction in RV systolic function, severe elevated pulmonary artery pressures, RVSP 75.2 mmHg  ?Left and right atrium with severe dilatation. Moderate pericardial effusion. Moderate TR,  ? ?Pulmonary hypertension, acute on chronic core pulmonale.  ? ?05/03 catheterization: PA 79/35, PA mean 55. PCWP 24. CO/CI 6,9/3.1 Fick. ? ?Urine output over last 24 hrs is  275 ml ?Blood pressure systolic has been 91 to 99 mmHg.  ? ?Continue diuresis with furosemide 80 mg IV q12  ?Metolazone 5 mg 05/04 ?Added midodrine for blood pressure support, and continue inotropic support with milrinone.  ? ?CAP (community acquired pneumonia) ?Positive leukocytosis. ?Chest radiograph with cardiomegaly and bilateral interstitial infiltrates, more confluent right lower lobe. ? ?Follow up chest film with persistent pulmonary edema.   ?Elevated procalcitonin  ? ?Plan to complete 5 days of antibiotic therapy, currently on oral Augmentin.  ? ? ? ?Acute kidney injury superimposed on  chronic kidney disease ([place]) ?CKD stage 3b. Hypokalemia, hyponatremia  ?Sp renal transplant.  ? ?Pending renal function this am.  ?Patient with very poor urine output and positive hypervolemia.  ?Has a left upper extremity fistula in place.  ? ?Continue immunosuppressive therapy for renal transplant.  ? ?Essential hypertension, benign ?Hypotension, continue with midodrine and milrinone.  ? ?Mixed hyperlipidemia ?Continue with statin therapy.  ? ?GERD (gastroesophageal reflux disease) ?Continue antiacid therapy.  ? ?LUPUS ?Stable with no acute flare.  ? ?Atrial flutter ([place]) ?Non sustained ventricular tachycardia,  ? ?Continue with amiodarone and anticoagulation with apixaban.  ? ?Coronary artery disease with exertional angina ([place]) ?Continue medical therapy. ?Patient with no chest pain.  ? ?Malnutrition of moderate degree ?Continue with nutritional supplements.  ? ? ? ? ?  ? ?Subjective: Patient continue to very weak and deconditioned, positive dyspnea on exertion. Positive hiccups.  ? ?Physical Exam: ?Vitals:  ? 09/15/21 0542 09/15/21 0551 09/15/21 0907 09/15/21 0952  ?BP: (!) 91/59 94/64 101/61   ?Pulse: 76  74   ?Resp: (!) 21  20   ?Temp: (!) 96.5 ?F (35.8 ?C) 97.6 ?F (36.4 ?C) (!) 96.4 ?F (35.8 ?C)   ?TempSrc: Axillary Oral Axillary   ?SpO2: 94% 98% 93% 98%  ?Weight: 82.7 kg     ?Height:      ? ?Neurology awake and alert ?ENT with mild pallor ?Cardiovascular with S1 and S2 present and rhythmic, positive systolic murmur at the right sternal border, 3/6  ?Positive JVD mild ?No lower extremity edema ?Respiratory with bilateral rales but no wheezing ?Abdomen not distended ?Data Reviewed: ? ? ? ?Family Communication: no family at the bedside  ? ?Disposition: ?Status is: Inpatient ?Remains inpatient appropriate because: heart failure with inotropic  support.  ? Planned Discharge Destination: Home ? ? ? ? ?Author: ?Tawni Millers, MD ?09/15/2021 10:03 AM ? ?For on call review [email or web address].  ?

## 2021-09-15 NOTE — Progress Notes (Signed)
? ? ? ?  On milrinone 0.125 mcg. Given 80 mg IV lasix this morning.  ? ?No urine output.  Bladder scan with 54 cc of urine. Oxygen requirement increased to 7 liters.  ? ?He is complaining of back pain and increased shortness of breath.  ? ?He appears uncomfortable. Will start  lasix drip at 15 mg per hour.  ? ?Concerned he may need CRRT. Will update Dr Haroldine Laws.  ? ?Omarr Hann NP-C  ?2:12 PM ? ? ? ? ?

## 2021-09-15 NOTE — Progress Notes (Signed)
?   09/14/21 1949  ?Assess: MEWS Score  ?Temp 97.6 ?F (36.4 ?C)  ?BP 99/65  ?ECG Heart Rate 84  ?Resp (!) 22  ?Level of Consciousness Alert  ?SpO2 (!) 87 %  ?O2 Device Nasal Cannula  ?O2 Flow Rate (L/min) 4 L/min  ?Assess: MEWS Score  ?MEWS Temp 0  ?MEWS Systolic 1  ?MEWS Pulse 0  ?MEWS RR 1  ?MEWS LOC 0  ?MEWS Score 2  ?MEWS Score Color Yellow  ?Assess: if the MEWS score is Yellow or Red  ?Were vital signs taken at a resting state? Yes  ?Focused Assessment No change from prior assessment  ?Early Detection of Sepsis Score *See Row Information* High  ?MEWS guidelines implemented *See Row Information* No, previously yellow, continue vital signs every 4 hours ?(not activated during the 1st yellow MEWS on 10/10/2021, but VS on q4)  ?Treat  ?Pain Scale 0-10  ?Pain Score 0  ?Notify: Charge Nurse/RN  ?Name of Charge Nurse/RN Notified Garey Ham  ?Date Charge Nurse/RN Notified 09/14/21  ?Time Charge Nurse/RN Notified 1949  ?Document  ?Patient Outcome Other (Comment) ?(remains stable)  ? ? ?

## 2021-09-15 NOTE — Progress Notes (Signed)
Mobility Specialist Progress Note ? ? 09/15/21 1642  ?Mobility  ?Activity Contraindicated/medical hold  ? ?Pt inappropriate for mobility specialist at this time given level of complexity, physical assist, and/or precautions as advised by RN, (Claimed too much fluid is on pt rn and focused on regulating them and letting them rest) . Mobility specialist to hold today, will continue to follow for readiness. ? ?Holland Falling ?Mobility Specialist ?Phone Number [phone] ? ?

## 2021-09-16 ENCOUNTER — Inpatient Hospital Stay (HOSPITAL_COMMUNITY): Payer: Managed Care, Other (non HMO)

## 2021-09-16 DIAGNOSIS — I5082 Biventricular heart failure: Secondary | ICD-10-CM | POA: Diagnosis not present

## 2021-09-16 DIAGNOSIS — J9601 Acute respiratory failure with hypoxia: Secondary | ICD-10-CM | POA: Diagnosis not present

## 2021-09-16 DIAGNOSIS — J189 Pneumonia, unspecified organism: Secondary | ICD-10-CM | POA: Diagnosis not present

## 2021-09-16 DIAGNOSIS — I10 Essential (primary) hypertension: Secondary | ICD-10-CM | POA: Diagnosis not present

## 2021-09-16 DIAGNOSIS — N189 Chronic kidney disease, unspecified: Secondary | ICD-10-CM | POA: Diagnosis not present

## 2021-09-16 DIAGNOSIS — N179 Acute kidney failure, unspecified: Secondary | ICD-10-CM | POA: Diagnosis not present

## 2021-09-16 DIAGNOSIS — I5043 Acute on chronic combined systolic (congestive) and diastolic (congestive) heart failure: Secondary | ICD-10-CM | POA: Diagnosis not present

## 2021-09-16 LAB — RENAL FUNCTION PANEL
Albumin: 2.2 g/dL — ABNORMAL LOW (ref 3.5–5.0)
Albumin: 2.1 g/dL — ABNORMAL LOW (ref 3.5–5.0)
Anion gap: 15 (ref 5–15)
Anion gap: 15 (ref 5–15)
BUN: 70 mg/dL — ABNORMAL HIGH (ref 6–20)
BUN: 63 mg/dL — ABNORMAL HIGH (ref 6–20)
CO2: 22 mmol/L (ref 22–32)
CO2: 19 mmol/L — ABNORMAL LOW (ref 22–32)
Calcium: 8.5 mg/dL — ABNORMAL LOW (ref 8.9–10.3)
Calcium: 8.1 mg/dL — ABNORMAL LOW (ref 8.9–10.3)
Chloride: 94 mmol/L — ABNORMAL LOW (ref 98–111)
Chloride: 91 mmol/L — ABNORMAL LOW (ref 98–111)
Creatinine, Ser: 3.98 mg/dL — ABNORMAL HIGH (ref 0.61–1.24)
Creatinine, Ser: 3.5 mg/dL — ABNORMAL HIGH (ref 0.61–1.24)
GFR, Estimated: 17 mL/min — ABNORMAL LOW (ref >60)
GFR, Estimated: 20 mL/min — ABNORMAL LOW (ref >60)
Glucose, Bld: 102 mg/dL — ABNORMAL HIGH (ref 70–99)
Glucose, Bld: 311 mg/dL — ABNORMAL HIGH (ref 70–99)
Phosphorus: 6.2 mg/dL — ABNORMAL HIGH (ref 2.5–4.6)
Phosphorus: 5.6 mg/dL — ABNORMAL HIGH (ref 2.5–4.6)
Potassium: 4.1 mmol/L (ref 3.5–5.1)
Potassium: 4.6 mmol/L (ref 3.5–5.1)
Sodium: 131 mmol/L — ABNORMAL LOW (ref 135–145)
Sodium: 125 mmol/L — ABNORMAL LOW (ref 135–145)

## 2021-09-16 LAB — GLUCOSE, CAPILLARY
Glucose-Capillary: 112 mg/dL — ABNORMAL HIGH (ref 70–99)
Glucose-Capillary: 116 mg/dL — ABNORMAL HIGH (ref 70–99)

## 2021-09-16 LAB — MRSA NEXT GEN BY PCR, NASAL: MRSA by PCR Next Gen: NOT DETECTED

## 2021-09-16 LAB — POCT I-STAT 7, (LYTES, BLD GAS, ICA,H+H)
Acid-base deficit: 2 mmol/L (ref 0.0–2.0)
Bicarbonate: 24.1 mmol/L (ref 20.0–28.0)
Calcium, Ion: 1.18 mmol/L (ref 1.15–1.40)
HCT: 41 % (ref 39.0–52.0)
Hemoglobin: 13.9 g/dL (ref 13.0–17.0)
O2 Saturation: 91 %
Patient temperature: 97.7
Potassium: 4.3 mmol/L (ref 3.5–5.1)
Sodium: 134 mmol/L — ABNORMAL LOW (ref 135–145)
TCO2: 25 mmol/L (ref 22–32)
pCO2 arterial: 42.4 mmHg (ref 32–48)
pH, Arterial: 7.36 (ref 7.35–7.45)
pO2, Arterial: 63 mmHg — ABNORMAL LOW (ref 83–108)

## 2021-09-16 LAB — CBC
HCT: 37.2 % — ABNORMAL LOW (ref 39.0–52.0)
Hemoglobin: 11.3 g/dL — ABNORMAL LOW (ref 13.0–17.0)
MCH: 23.3 pg — ABNORMAL LOW (ref 26.0–34.0)
MCHC: 30.4 g/dL (ref 30.0–36.0)
MCV: 76.5 fL — ABNORMAL LOW (ref 80.0–100.0)
Platelets: 300 10*3/uL (ref 150–400)
RBC: 4.86 MIL/uL (ref 4.22–5.81)
RDW: 18 % — ABNORMAL HIGH (ref 11.5–15.5)
WBC: 11.3 10*3/uL — ABNORMAL HIGH (ref 4.0–10.5)
nRBC: 0 % (ref 0.0–0.2)

## 2021-09-16 MED ORDER — PRISMASOL BGK 4/2.5 32-4-2.5 MEQ/L EC SOLN: Status: DC

## 2021-09-16 MED ORDER — HEPARIN (PORCINE) 2000 UNITS/L FOR CRRT: INTRAVENOUS_CENTRAL | Status: DC | PRN

## 2021-09-16 MED ORDER — SODIUM CHLORIDE 0.9 % IV SOLN
500.0000 [IU]/h | INTRAVENOUS | Status: DC
  Administered 2021-09-16: 750 [IU]/h via INTRAVENOUS_CENTRAL
  Administered 2021-09-17 – 2021-09-18 (×3): 500 [IU]/h via INTRAVENOUS_CENTRAL
  Filled 2021-09-16: qty 2
  Filled 2021-09-16: qty 10000
  Filled 2021-09-16: qty 2
  Filled 2021-09-16: qty 10000

## 2021-09-16 MED ORDER — PAROXETINE HCL 20 MG PO TABS
20.0000 mg | ORAL_TABLET | Freq: Every evening | ORAL | Status: DC
  Administered 2021-09-16 – 2021-09-19 (×4): 20 mg via ORAL
  Filled 2021-09-16 (×4): qty 1

## 2021-09-16 MED ORDER — PRISMASOL BGK 4/2.5 32-4-2.5 MEQ/L REPLACEMENT SOLN: Status: DC

## 2021-09-16 MED ORDER — CALCIUM ACETATE (PHOS BINDER) 667 MG PO CAPS
667.0000 mg | ORAL_CAPSULE | Freq: Three times a day (TID) | ORAL | Status: DC
  Administered 2021-09-16 – 2021-09-18 (×6): 667 mg via ORAL
  Filled 2021-09-16 (×6): qty 1

## 2021-09-16 MED ORDER — HYDROCORTISONE SOD SUC (PF) 100 MG IJ SOLR
75.0000 mg | Freq: Two times a day (BID) | INTRAMUSCULAR | Status: DC
Start: 2021-09-16 — End: 2021-09-18
  Administered 2021-09-16 – 2021-09-17 (×4): 75 mg via INTRAVENOUS
  Filled 2021-09-16: qty 1.5
  Filled 2021-09-16 (×2): qty 2
  Filled 2021-09-16: qty 1.5
  Filled 2021-09-16: qty 2

## 2021-09-16 MED ORDER — FAMOTIDINE 20 MG PO TABS
20.0000 mg | ORAL_TABLET | Freq: Every day | ORAL | Status: DC
  Administered 2021-09-16 – 2021-09-19 (×4): 20 mg via ORAL
  Filled 2021-09-16 (×4): qty 1

## 2021-09-16 MED ORDER — ORAL CARE MOUTH RINSE
15.0000 mL | Freq: Two times a day (BID) | OROMUCOSAL | Status: DC
  Administered 2021-09-18 – 2021-09-19 (×4): 15 mL via OROMUCOSAL

## 2021-09-16 MED ORDER — HEPARIN SODIUM (PORCINE) 1000 UNIT/ML DIALYSIS
1000.0000 [IU] | INTRAMUSCULAR | Status: DC | PRN
  Administered 2021-09-19: 2400 [IU] via INTRAVENOUS_CENTRAL
  Filled 2021-09-16: qty 6
  Filled 2021-09-16: qty 4

## 2021-09-16 MED ORDER — NOREPINEPHRINE 4 MG/250ML-% IV SOLN
0.0000 ug/min | INTRAVENOUS | Status: DC
  Administered 2021-09-19 – 2021-09-22 (×4): 2 ug/min via INTRAVENOUS
  Filled 2021-09-16 (×4): qty 250

## 2021-09-16 MED ORDER — CHLORHEXIDINE GLUCONATE CLOTH 2 % EX PADS
6.0000 | MEDICATED_PAD | Freq: Every day | CUTANEOUS | Status: DC
  Administered 2021-09-16 – 2021-09-22 (×7): 6 via TOPICAL

## 2021-09-16 MED ORDER — CHLORHEXIDINE GLUCONATE 0.12 % MT SOLN
15.0000 mL | Freq: Two times a day (BID) | OROMUCOSAL | Status: DC
  Administered 2021-09-16 – 2021-09-20 (×8): 15 mL via OROMUCOSAL
  Filled 2021-09-16 (×8): qty 15

## 2021-09-16 NOTE — Progress Notes (Addendum)
eLink Physician-Brief Progress Note ?Patient Name: Roy Dyer ?DOB: Aug 06, 1965 ?MRN: [id number] ? ? ?Date of Service ? 09/16/2021  ?HPI/Events of Note ? Nursing reports increased lethargy and confusion. BP = 137/77 and Sat = 99%.  ?eICU Interventions ? Plan: ?Blood glucose STAT. ?ABG STAT. ?D/C Tramadol.  ? ? ? ?Intervention Category ?Major Interventions: Change in mental status - evaluation and management ? ?Jerime Arif Cornelia Copa ?09/16/2021, 9:29 PM ?

## 2021-09-16 NOTE — Progress Notes (Signed)
Patient ID: Roy Dyer, male   DOB: 1966/03/05, 56 y.o.   MRN: [id number] ?[facility] ?Progress Note  ? ?Assessment/ Plan:   ?1. 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  Labs this morning indicate worsening renal function with creatinine up to 3.98 after seeming improvement earlier this hospitalization when it went down from 2.7 to 2.4.  I suspect that this is most likely hemodynamically mediated in the setting of CHF exacerbation rather than de novo renal process such as rejection.  He had some improvement of his urine output overnight on furosemide drip/dobutamine but remains volume overloaded with worsening respiratory status/oxygen requirements.  At this point, would favor transfer to ICU for CRRT as his blood pressures likely will not favor intermittent hemodialysis. ?2.  Acute exacerbation of congestive heart failure: Right heart catheterization yesterday showed elevated biventricular pressures with high cardiac output and severe mixed pulmonary hypertension.  Poor response to escalation of diuretics noted so far and with continued worsening of renal function.  Begin CRRT today for volume unloading. ?3.  Status post cadaveric kidney transplant: Continue maintenance immunosuppressive therapy with Envarsus, mycophenolate and prednisone.  With acute kidney injury that appears to be hemodynamically mediated in the setting of CHF exacerbation. ?4.  Hyponatremia: Secondary to CHF exacerbation and acute kidney injury, monitor with ongoing diuretics. ? ?Subjective:   ?With worsening shortness of breath/hypoxemia overnight.  Intermittently somnolent/confused.  ? ?Objective:   ?BP 100/69 (BP Location: Right Arm)   Pulse 81   Temp 97.7 ?F (36.5 ?C) (Oral)   Resp 20   Ht '5\' 7"'$  (1.702 m)   Wt 83 kg   SpO2 96%   BMI 28.65 kg/m?   ? ?Intake/Output Summary (Last 24 hours) at 09/16/2021 1050 ?Last data filed at 09/16/2021 1049 ?Gross per 24 hour  ?Intake 835.21 ml  ?Output 900 ml  ?Net -64.79 ml  ? ?Weight change: 0.272 kg ? ?Physical Exam: ?Gen: Appears dyspneic on conversation while wearing oxygen via nasal cannula ?CVS: Pulse regular rhythm and normal rate.  Pericardial rub audible over the precordium ?Resp: Poor inspiratory effort with decreased breath sounds over both lung bases ?Abd: Soft, flat, nontender, bowel sounds normal ?Ext: Trace lower extremity edema ? ?Imaging: ?No results found. ? ?Labs: ?BMET ?Recent Labs  ?Lab 09/20/2021 ?1617 09/30/2021 ?2202 09/18/2021 ?2224 09/12/21 ?0408 10/01/2021 ?6295 10/07/2021 ?1329 09/21/2021 ?1333 09/29/2021 ?1334 09/14/21 ?0308 09/15/21 ?2841 09/16/21 ?0345  ?NA 133*  --    < > 131* 135 136 137 138 133* 133* 131*  ?K 4.1  --    < > 4.4 3.8 3.6 3.4* 3.3* 4.1 4.6 4.1  ?CL 96*  --   --  95* 98  --   --   --  99 98 94*  ?CO2 22  --   --  23 24  --   --   --  19* 23 22  ?GLUCOSE 115*  --   --  106* 106*  --   --   --  159* 120* 102*  ?BUN 42*  --   --  43* 43*  --   --   --  49* 61* 70*  ?CREATININE 2.72*  --   --  2.73* 2.40*  --   --   --  2.40* 3.48* 3.98*  ?CALCIUM 9.2  --   --  8.4* 8.3*  --   --   --  8.6* 8.5* 8.5*  ?PHOS  --  4.8*  --  5.0*  --   --   --   --  4.5  --  6.2*  ? < > = values in this interval not displayed.  ? ?CBC ?Recent Labs  ?Lab 09/19/2021 ?1617 10/02/2021 ?2224 09/12/21 ?0408 10/08/2021 ?1329 10/11/2021 ?1334 09/14/21 ?0308 09/15/21 ?9211 09/16/21 ?0345  ?WBC 17.3*  --  17.7*  --   --  21.2* 16.2* 11.3*  ?NEUTROABS 14.9*  --   --   --   --  18.0*  --   --   ?HGB 13.9   < > 12.3*   < > 13.9 13.2 12.2* 11.3*  ?HCT 47.9   < > 39.8   < > 41.0 42.5 39.5 37.2*  ?MCV 79.2*  --  78.0*  --   --  77.1* 78.8* 76.5*  ?PLT 408*  --  330  --   --  354 299 300  ? < > = values in this interval not displayed.  ? ? ?Medications:   ? ? amiodarone  100 mg Oral Daily  ? apixaban  5 mg Oral BID  ? atorvastatin  80 mg  Oral QHS  ? calcium acetate  667 mg Oral TID WC  ? clopidogrel  75 mg Oral Daily  ? colchicine  0.6 mg Oral Daily  ? famotidine  40 mg Oral QHS  ? feeding supplement  237 mL Oral TID BM  ? fluticasone  2 spray Each Nare Daily  ? guaiFENesin  600 mg Oral BID  ? hydrocortisone sod succinate (SOLU-CORTEF) inj  75 mg Intravenous Q12H  ? mexiletine  300 mg Oral BID  ? midodrine  5 mg Oral TID WC  ? multivitamin with minerals  1 tablet Oral Daily  ? mycophenolate  750 mg Oral BID  ? pantoprazole  40 mg Oral Daily  ? PARoxetine  20 mg Oral QPM  ? sodium chloride flush  3 mL Intravenous Q12H  ? sodium chloride flush  3 mL Intravenous Q12H  ? sodium chloride flush  3 mL Intravenous Q12H  ? tacrolimus ER  0.75 mg Oral Daily  ? urea   Topical BID  ? ?Elmarie Shiley, MD ?09/16/2021, 10:50 AM  ? ?

## 2021-09-16 NOTE — Progress Notes (Signed)
PT on home Cpap. No resp distress noted. ?

## 2021-09-16 NOTE — Progress Notes (Signed)
?Progress Note ? ? ?Patient: Roy Dyer:[id number] DOB: 1965/12/28 DOA: 09/24/2021     5 ?DOS: the patient was seen and examined on 09/16/2021 ?  ?Brief hospital course: ?56 year old gentleman with history of CKD stage IIIb, lupus status post renal transplant, coronary artery disease status post multiple stents, ischemic cardiomyopathy, AICD, hypertension, a flutter and prediabetes admitted with worsening shortness of breath, found to have AKI as well as fluid overload.  Baseline creatinine about 1.5-1.7.  Hypoxemic on arrival.  Chest x-ray with cardiovascular prominence.  Admitted with nephrology and cardiology consultations. ?  ?Placed on aggressive diuresis. ? ?05/03 cardiac catheterization with elevated filling pressures.  ?05/06 continue with signs of pulmonary edema and increased 02 requirements. ?Placed on high flow nasal cannula and inotropic support. ?Stress dose steroids, for suspected adrenal insufficiency in the setting of chronic prednisone use.  ? ?Assessment and Plan: ?Acute on chronic combined systolic and diastolic CHF (congestive heart failure) ([place]) ?Acute hypoxemic respiratory failure.  ? ?Echocardiogram with reduced LV systolic function with EF 30 to 35%, global hypokinesis, mild reduction in RV systolic function, severe elevated pulmonary artery pressures, RVSP 75.2 mmHg  ?Left and right atrium with severe dilatation. Moderate pericardial effusion. Moderate TR,  ? ?Pulmonary hypertension, acute on chronic core pulmonale.  ? ?05/03 catheterization: PA 79/35, PA mean 55. PCWP 24. CO/CI 6,9/3.1 Fick. ? ?05/05 poor urine output despite aggressive diuresis, patient placed on dobutamine drip (3 mcg/kg/min) and furosemide drip 15 mg/hr.  ?Metolazone 5 mg 05/04.  ? ?Urine output is 650 ml over last 24 hrs  ?Systolic blood pressure 353 to 111 mmHg.  ? ?Metabolic encephalopathy: Patient with somnolence today and mild confusion. Possible hypoperfusion.  ?Discontinue clonazepam and baclofen.  ?Decrease  paroxetine to 20 mg.  ? ?Continue with inotropic support along with aggressive diuresis. ?Patient may need CRRT for further fluid removal.  ?Add stress dose steroids for suspected adrenal insufficiency in the setting of chronic prednisone use.  ? ?CAP (community acquired pneumonia) ?Chest radiograph with cardiomegaly and bilateral interstitial infiltrates, more confluent right lower lobe. ? ?Follow up chest film with persistent pulmonary edema.   ?Elevated procalcitonin  ? ?Patient completed 5 days of antibiotic therapy ?Wbc is improving.  ? ? ? ?Acute kidney injury superimposed on chronic kidney disease ([place]) ?CKD stage 3b. Hypokalemia, hyponatremia. Hyperphosphatemia  ?Sp renal transplant.  ? ?Renal function with serum cr at 3,98 with K at 4,1 and serum bicarbonate at 22, NA 131. P 6,2  ? ?Has a left upper extremity fistula in place.  ? ?Continue immunosuppressive therapy for renal transplant.  ?Continue diuresis with furosemide and inotropic support with dobutamine. ?Add phosphate binder.   ?May need CRRT if not clinically improving.  ? ? ?Essential hypertension, benign ?Continue inotropic support with dobutamine and blood pressure support with midodrine. ? ?Add stress dose steroids, for potential adrenal insufficiency.  ? ?Mixed hyperlipidemia ?Continue with statin therapy.  ? ?GERD (gastroesophageal reflux disease) ?Continue antiacid therapy.  ? ?LUPUS ?Stable with no acute flare.  ? ?Atrial flutter ([place]) ?Non sustained ventricular tachycardia,  ? ?Continue with amiodarone and anticoagulation with apixaban.  ? ?Coronary artery disease with exertional angina ([place]) ?Continue medical therapy. ?Patient with no chest pain.  ? ?Malnutrition of moderate degree ?Continue with nutritional supplements.  ? ? ? ? ?  ? ?Subjective: Patient is somnolent, very weak and deconditioned, continue with poor urine output.  ? ?Physical Exam: ?Vitals:  ? 09/15/21 2023 09/16/21 2992 09/16/21 0524 09/16/21 0716  ?BP: 103/68 99/68  112/77 100/69  ?Pulse: 83 80 84 81  ?Resp: (!) 22 (!) 23 (!) 24 20  ?Temp: (!) 96.7 ?F (35.9 ?C)  (!) 96.2 ?F (35.7 ?C) 97.7 ?F (36.5 ?C)  ?TempSrc: Axillary  Axillary Oral  ?SpO2: 100% 98% 90% 96%  ?Weight:   83 kg   ?Height:      ? ?Neurology somnolent, deconditioned and ill looking appearing, mild confusion ?ENT mild pallor ?Cardiovascular with S1 and S2 present with positive systolic murmur at the right sternal border, no rubs ?Positive JVD ?Trace lower extremity edema ?Respiratory with rales bilaterally with no wheezing ?Abdomen not distended  ?Data Reviewed: ? ? ? ?Family Communication: I spoke over the phone with the patient's wife about patient's  condition, plan of care, prognosis and all questions were addressed.  ? ?Disposition: ?Status is: Inpatient ?Remains inpatient appropriate because: heart failure on IV inotropic support  ? Planned Discharge Destination: Home ? ? ? ? ? ?Author: ?Tawni Millers, MD ?09/16/2021 10:37 AM ? ?For on call review [email or web address].  ?

## 2021-09-16 NOTE — Consult Note (Signed)
? ?NAME:  Roy Dyer, MRN:  [id number], DOB:  1965/08/01, LOS: 5 ?ADMISSION DATE:  09/23/2021, CONSULTATION DATE:  09/16/21 ?REFERRING MD:  TRH, CHIEF COMPLAINT:  SOB  ? ?History of Present Illness:  ?56 year old man with hx of ESRD s/p renal transplant now with CKD, CAD, Afib who presented with insidious onset SOB.  Found to be in biventricular failure, failed diuretic+inotrope trial.  Due to low Bps, worsening dyspnea and edema, transferred to ICU and PCCM to assume care from [facility] ? ?Patient doing okay as long as he sits up, dyspnea with minimal movement. ? ?Pertinent  Medical History  ?History of renal transplant ?Aflutter ?SLE ?CAD ?OSA ? ? ?Significant Hospital Events: ?Including procedures, antibiotic start and stop dates in addition to other pertinent events   ?5/1 admitted ?5/3 R/HC ?[place] 09/14/21 ?RA = 11 (no significant v-waves) ?RV = 77/15 ?PA = 79/35 (55) ?PCW = 24 ?Fick cardiac output/index = 6.96/3.61 ?Thermo = 6.92/3.59 ?PVR = 4.5 WU ?RA sat = 99% ?PA sat = 71%, 77%, 79% ?SVC sat = 86% ?5/6 worsening resp distress, to ICU ? ?Interim History / Subjective:  ?consulted ? ?Objective   ?Blood pressure 115/74, pulse 83, temperature 97.7 ?F (36.5 ?C), temperature source Oral, resp. rate 19, height '5\' 7"'$  (1.702 m), weight 83 kg, SpO2 100 %. ?   ?   ? ?Intake/Output Summary (Last 24 hours) at 09/16/2021 1500 ?Last data filed at 09/16/2021 1436 ?Gross per 24 hour  ?Intake 826.74 ml  ?Output 650 ml  ?Net 176.74 ml  ? ?Filed Weights  ? 09/14/21 0535 09/15/21 0542 09/16/21 0524  ?Weight: 81.7 kg 82.7 kg 83 kg  ? ? ?Examination: ?General: anxious man laying in bed ?HENT: MMM, trachea midline ?Lungs: Crackles bases, mild accessory muscle use ?Cardiovascular: RRR, ext warm ?Abdomen: Soft, +BS ?Extremities: 1+ edema to thighs, LUE fistula ?Neuro: Moves all 4 ext to command ?Psych: Aox3, anxious ? ?Resolved Hospital Problem list   ?N/A ? 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 ?- CRRT with inotropes neg pull per CHF and nephro discussion ?- BIPAP PRN ?- Levophed and midodrine titrated to MAP 65 ?- Completed 5 days abx ?- Eliquis for now, maybe transition to heparin tomorrow? ?- Check AM cortisol, consider stress steroids ? ?Best Practice (right click and "Reselect all SmartList Selections" daily)  ? ?Diet/type: Regular consistency (see orders) ?DVT prophylaxis: DOAC ?GI prophylaxis: H2B ?Lines: Dialysis Catheter ?Foley:  N/A ?Code Status:  full code ?Last date of multidisciplinary goals of care discussion [Pending] ? ?Labs   ?CBC: ?Recent Labs  ?Lab 09/28/2021 ?1617 09/21/2021 ?2224 09/12/21 ?0408 09/30/2021 ?1329 09/27/2021 ?1333 09/26/2021 ?1334 09/14/21 ?0308 09/15/21 ?5885 09/16/21 ?0345  ?WBC 17.3*  --  17.7*  --   --   --  21.2* 16.2* 11.3*  ?NEUTROABS 14.9*  --   --   --   --   --  18.0*  --   --   ?HGB 13.9   < > 12.3*   < > 14.3 13.9 13.2 12.2* 11.3*  ?HCT 47.9   < > 39.8   < > 42.0 41.0 42.5 39.5 37.2*  ?MCV 79.2*  --  78.0*  --   --   --  77.1* 78.8* 76.5*  ?PLT 408*  --  330  --   --   --  354 299 300  ? < > = values in this interval not displayed.  ? ? ?Basic Metabolic Panel: ?Recent Labs  ?Lab 09/17/2021 ?2202 09/26/2021 ?2224 09/12/21 ?0408 10/05/2021 ?0109 09/20/2021 ?1329 09/26/2021 ?1333 09/23/2021 ?1334 09/14/21 ?0308 09/15/21 ?3235 09/16/21 ?0345  ?NA  --    < > 131* 135   < > 137 138 133* 133* 131*  ?K  --    < > 4.4 3.8   < > 3.4* 3.3* 4.1 4.6 4.1  ?CL  --   --  95* 98  --   --   --  99 98 94*  ?CO2  --   --  23 24  --   --   --  19* 23 22  ?GLUCOSE  --   --  106* 106*  --   --   --  159* 120* 102*  ?BUN  --   --  43* 43*  --   --   --  49* 61*  70*  ?CREATININE  --   --  2.73* 2.40*  --   --   --  2.40* 3.48* 3.98*  ?CALCIUM  --   --  8.4* 8.3*  --   --   --  8.6* 8.5* 8.5*  ?MG 1.8  --  1.8  --   --   --   --   --   --   --   ?PHOS 4.8*  --  5.0*  --   --   --   --  4.5  --  6.2*  ? < > = values in this interval not displayed.  ? ?GFR: ?Estimated Creatinine Clearance: 21.9 mL/min (A) (by C-G formula based on SCr of 3.98 mg/dL (H)). ?Recent Labs  ?Lab 09/24/2021 ?2202 09/12/21 ?0408 10/10/2021 ?0321 09/14/21 ?0308 09/15/21 ?5732 09/16/21 ?0345  ?PROCALCITON 1.16 1.23 1.16 1.25  --   --   ?WBC  --  17.7*  --  21.2* 16.2* 11.3*  ? ? ?Liver Function Tests: ?Recent Labs  ?Lab 09/20/2021 ?1617 09/28/2021 ?2202 09/12/21 ?0408 09/23/2021 ?2025 09/14/21 ?0308 09/16/21 ?0345  ?AST 36 '28 29 26  '$ --   --   ?ALT 32 '26 26 20  '$ --   --   ?ALKPHOS 123 99 107 92  --   --   ?BILITOT 1.6* 1.4* 1.4* 1.3*  --   --   ?PROT 6.5 5.5* 5.2* 4.7*  --   --   ?ALBUMIN 3.3* 2.6* 2.6* 2.3* 2.3* 2.2*  ? ?No results for input(s): LIPASE, AMYLASE in the last 168 hours. ?No results for input(s): AMMONIA in the last 168 hours. ? ?ABG ?   ?Component Value Date/Time  ? PHART 7.34 (L) 09/14/2021 0309  ? PCO2ART 41 09/14/2021 0309  ? PO2ART 80 (L) 09/14/2021 0309  ? HCO3 22.1 09/14/2021 0309  ? TCO2 24 09/28/2021 1334  ? ACIDBASEDEF 3.5 (H) 09/14/2021 0309  ? O2SAT 96.3 09/14/2021 0309  ?  ? ?Coagulation Profile: ?No results for input(s): INR, PROTIME in the last 168 hours. ? ?Cardiac Enzymes: ?Recent Labs  ?Lab 09/23/2021 ?2202  ?CKTOTAL 37*  ? ? ?HbA1C: ?Hemoglobin A1C  ?Date/Time Value Ref Range Status  ?04/15/2018 12:00 AM 5.6  Final  ? ?Hgb A1c MFr Bld  ?Date/Time Value Ref Range Status  ?07/07/2021 09:05 AM 6.3 4.6 -  6.5 % Final  ?  Comment:  ?  Glycemic Control Guidelines for People with Diabetes:Non Diabetic:  <6%Goal of Therapy: <7%Additional Action Suggested:  >8%   ?02/04/2021 06:40 AM 6.1 (H) 4.8 - 5.6 % Final  ?  Comment:  ?  (NOTE) ?Pre diabetes:          5.7%-6.4% ? ?Diabetes:               >6.4% ? ?Glycemic control for   <7.0% ?adults with diabetes ?  ? ? ?CBG: ?Recent Labs  ?Lab 09/14/21 ?4680  ?GLUCAP 151*  ? ? ?Review of Systems:   ? ?Positive Symptoms in bold: ? ?Constitutional fevers, chills, weight loss, fatigue, anorexia, malaise  ?Eyes decreased vision, double vision, eye irritation  ?Ears, Nose, Mouth, Throat sore throat, trouble swallowing, sinus congestion  ?Cardiovascular chest pain, paroxysmal nocturnal dyspnea, lower ext edema, palpitations ?  ?Respiratory SOB, cough, DOE, hemoptysis, wheezing  ?Gastrointestinal nausea, vomiting, diarrhea  ?Genitourinary burning with urination, trouble urinating  ?Musculoskeletal joint aches, joint swelling, back pain  ?Integumentary  rashes, skin lesions  ?Neurological focal weakness, focal numbness, trouble speaking, headaches  ?Psychiatric depression, anxiety, confusion  ?Endocrine polyuria, polydipsia, cold intolerance, heat intolerance  ?Hematologic abnormal bruising, abnormal bleeding, unexplained nose bleeds  ?Allergic/Immunologic recurrent infections, hives, swollen lymph nodes  ? ? ? ?Past Medical History:  ?He,  has a past medical history of AICD (automatic cardioverter/defibrillator) present, Atrial flutter ([place]) (01/2021), Avascular necrosis of bone of hip ([place]) (2010 surg), Barrett's esophagus (2017; 2021), Blood transfusion without reported diagnosis (2010, 2012), CAD (coronary artery disease), Cataract, CHF (congestive heart failure) ([place]), Chronic headaches (02/2017), Chronic renal insufficiency, stage 3 (moderate) ([place]), Erythrocytosis, GERD (gastroesophageal reflux disease), Gout, Hiatal hernia, History of adenomatous polyp of colon (09/2019), History of end stage renal disease (04/24/2011), History of renal transplant (11/10/2012), Hyperlipidemia, Hypertension, implantable cardiac defibrillator single chamber, Ischemic cardiomyopathy, LBBB (left bundle branch block) (2023), Left ventricular thrombus (2012), Lupus ([place]), OSA on CPAP  (2018), Osteoporosis, 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  ? ?Surgical History:  ? ?Past Surgical History:  ?Procedure Laterality Date  ? AV F

## 2021-09-16 NOTE — Progress Notes (Signed)
Lasix gtt still running while on CRRT. RN clarified orders and received verbal orders to DC lasix drip. ? ?

## 2021-09-16 NOTE — Progress Notes (Addendum)
? ? Advanced Heart Failure Rounding Note ? ?PCP-Cardiologist: [name], MD  ? ?Subjective:   ? ?Echo: EF 30-35% (stable), severe BAE, RV mod reduced, mod TR, Mod Pericardial Effusion w/o tamponade, IVC dilated, RVSP severely elevated 75 mmHg  ? ?[place] 09/14/21 ?RA = 11 (no significant v-waves) ?RV = 77/15 ?PA = 79/35 (55) ?PCW = 24 ?Fick cardiac output/index = 6.96/3.61 ?Thermo = 6.92/3.59 ?PVR = 4.5 WU ?FA sat = 99% ?PA sat = 71%, 77%, 79% ?SVC sat = 86% ?  ? ?Switched from milrinone to DBA 3 yesterday. Now on lasix gtt at 15. Only 250cc out.  ? ?Remains SOB. + orthopnea Scr continues to climb  2.4 -> 3.4 -> 4.0 ? ? ?Objective:   ?Weight Range: ?83 kg ?Body mass index is 28.65 kg/m?.  ? ?Vital Signs:   ?Temp:  [96.2 ?F (35.7 ?C)-97.7 ?F (36.5 ?C)] 97.7 ?F (36.5 ?C) (05/06 8841) ?Pulse Rate:  [80-84] 81 (05/06 0716) ?Resp:  [20-24] 20 (05/06 0716) ?BP: (99-112)/(65-77) 100/69 (05/06 0716) ?SpO2:  [90 %-100 %] 96 % (05/06 0716) ?Weight:  [83 kg] 83 kg (05/06 0524) ?Last BM Date : 09/14/21 ? ?Weight change: ?Filed Weights  ? 09/14/21 0535 09/15/21 0542 09/16/21 0524  ?Weight: 81.7 kg 82.7 kg 83 kg  ? ? ?Intake/Output:  ? ?Intake/Output Summary (Last 24 hours) at 09/16/2021 1101 ?Last data filed at 09/16/2021 1049 ?Gross per 24 hour  ?Intake 835.21 ml  ?Output 900 ml  ?Net -64.79 ml  ? ?  ? ? ?Physical Exam  ? ?General:  Weak  tachypneic sats in high 80  ?HEENT: normal ?Neck: supple. JVP to ear Carotids 2+ bilat; no bruits. No lymphadenopathy or thryomegaly appreciated. ?Cor: PMI nondisplaced. Regular rate & rhythm. 2/6 TR ?Lungs: + crackles ?Abdomen: soft, nontender, nondistended. No hepatosplenomegaly. No bruits or masses. Good bowel sounds. ?Extremities: no cyanosis, clubbing, rash, 1-2+ edema  Large AVF in LUE ?Neuro: alert & orientedx3, cranial nerves grossly intact. moves all 4 extremities w/o difficulty. Affect pleasant ? ? ?Telemetry  ?  ?SR 80s Personally reviewed ? ?Labs  ?  ?CBC ?Recent Labs  ?  09/14/21 ?0308  09/15/21 ?6606 09/16/21 ?0345  ?WBC 21.2* 16.2* 11.3*  ?NEUTROABS 18.0*  --   --   ?HGB 13.2 12.2* 11.3*  ?HCT 42.5 39.5 37.2*  ?MCV 77.1* 78.8* 76.5*  ?PLT 354 299 300  ? ? ?Basic Metabolic Panel ?Recent Labs  ?  09/14/21 ?0308 09/15/21 ?3016 09/16/21 ?0345  ?NA 133* 133* 131*  ?K 4.1 4.6 4.1  ?CL 99 98 94*  ?CO2 19* 23 22  ?GLUCOSE 159* 120* 102*  ?BUN 49* 61* 70*  ?CREATININE 2.40* 3.48* 3.98*  ?CALCIUM 8.6* 8.5* 8.5*  ?PHOS 4.5  --  6.2*  ? ? ?Liver Function Tests ?Recent Labs  ?  09/14/21 ?0308 09/16/21 ?0345  ?ALBUMIN 2.3* 2.2*  ? ? ?No results for input(s): LIPASE, AMYLASE in the last 72 hours. ?Cardiac Enzymes ?No results for input(s): CKTOTAL, CKMB, CKMBINDEX, TROPONINI in the last 72 hours. ? ? ?BNP: ?BNP (last 3 results) ?Recent Labs  ?  05/02/21 ?1234 10/04/2021 ?1617 09/14/21 ?0308  ?BNP 1,152.1* 1,553.9* 1,395.1*  ? ? ? ?ProBNP (last 3 results) ?No results for input(s): PROBNP in the last 8760 hours. ? ? ?D-Dimer ?No results for input(s): DDIMER in the last 72 hours. ?Hemoglobin A1C ?No results for input(s): HGBA1C in the last 72 hours. ?Fasting Lipid Panel ?No results for input(s): CHOL, HDL, LDLCALC, TRIG, CHOLHDL, LDLDIRECT in the  last 72 hours. ?Thyroid Function Tests ?No results for input(s): TSH, T4TOTAL, T3FREE, THYROIDAB in the last 72 hours. ? ?Invalid input(s): FREET3 ? ? ?Other results: ? ? ?Imaging  ? ? ?No results found. ? ? ?Medications:   ? ? ?Scheduled Medications: ? amiodarone  100 mg Oral Daily  ? apixaban  5 mg Oral BID  ? atorvastatin  80 mg Oral QHS  ? calcium acetate  667 mg Oral TID WC  ? clopidogrel  75 mg Oral Daily  ? colchicine  0.6 mg Oral Daily  ? famotidine  40 mg Oral QHS  ? feeding supplement  237 mL Oral TID BM  ? fluticasone  2 spray Each Nare Daily  ? guaiFENesin  600 mg Oral BID  ? hydrocortisone sod succinate (SOLU-CORTEF) inj  75 mg Intravenous Q12H  ? mexiletine  300 mg Oral BID  ? midodrine  5 mg Oral TID WC  ? multivitamin with minerals  1 tablet Oral Daily  ?  mycophenolate  750 mg Oral BID  ? pantoprazole  40 mg Oral Daily  ? PARoxetine  20 mg Oral QPM  ? sodium chloride flush  3 mL Intravenous Q12H  ? sodium chloride flush  3 mL Intravenous Q12H  ? sodium chloride flush  3 mL Intravenous Q12H  ? tacrolimus ER  0.75 mg Oral Daily  ? urea   Topical BID  ? ? ?Infusions: ? sodium chloride    ? sodium chloride 250 mL (09/15/21 1627)  ? DOBUTamine 3 mcg/kg/min (09/15/21 1832)  ? furosemide (LASIX) 200 mg in dextrose 5% 100 mL (26m/mL) infusion 15 mg/hr (09/16/21 0440)  ? ? ?PRN Medications: ?sodium chloride, sodium chloride, acetaminophen, albuterol, HYDROcodone-acetaminophen, melatonin, ondansetron (ZOFRAN) IV, ondansetron **OR** [DISCONTINUED] ondansetron (ZOFRAN) IV, polyethylene glycol, sodium chloride flush, sodium chloride flush, traMADol ? ? ? ?Patient Profile  ? ?56y/o male w/ chronic systolic heart failure due to ICM, CAD, h/o pericarditis, Stage IIIa CKD s/p renal transplant in 2012 due to SLE, PAF/AFL and frequent PVCs admitted for a/c CHF.  ?  ? ?Assessment/Plan  ? ?Acute on Chronic Biventricular Heart Failure ?- HX ICD in 2012 ?- Echo 12/17 EF 30%  ?- Cath 04/22/20 for worsening HF symptoms. Stable CAD. ?- Echo 6/22 EF 30-35%, Grade III DD, severe LAE, mild/mod TR ?- Echo 02/04/21: EF 30-35% No effusion, RV normal. ?- Echo 3/23: EF 35%, RV mod reduced, severe TR ?- Admitted w/ a/c CHF w/  worsening NYHA Functional Class IIIb and fluid overload w/ AKI on CKD  ?- Echo EF 30-35% (stable), severe BAE, RV mod reduced, mod TR, Mod Pericardial Effusion w/o tamponade, IVC dilated, RVSP severely elevated 75 mmHg  ?- RHC w/ elevated biventricular filling pressures w/ high CO in setting of known peripheral shunt (AVF) + severe mixed pulmonary HTN, RA 11, PCWP 24 ?- No benefit from milrinone or DBA. Renal function and volume status worse.  ?- Based on RHC low output does not seem to be the issue. Not sure ther is much we have left to offer him other than CVVHD to improve his  situation.  ?- I d/w Dr. PPosey Pronto Will move to ICU for CVVHD initiation  ? ?2. Pericardial Effusion ?- moderate on echo. No tamponade. BP stable  ?- Suspect 2/2 pericarditis + CHF  ?- Likely related to renal failure/volume overload. Should improved with HD ?  ?3. Mod- Severe Tricuspid Regurgitation  ?- Recent progression, Echo 9/22 TR moderate ?- Echo 3/23 TR Severe  ?- Mod  on Echo this admit  ?- functional in setting of dilated RV ?- no significant v-waves on RHC  ?- HF optimization per above ?  ?4. Pulmonary HTN  ?- Echo 3/23 w/ severely elevated RVSP, 68 mmHg and severe TR.  ?- Echo this admit RVSP 75  ?- Combination WHO Groups 2+3.  ?- Given H/o SLE ? WHO Group 1 component  ?- RHC w/ severe mixed pulmonary HTN, PVR = 4.5 WU ?- V/Q scan 9/22 Negative for PE  ?- on CPAP for OSA. Reports intermittent compliance  ?  ?5. AKI no CKD Stage IIIa S/p  Renal transplant in 2012 (due to SLE): ?- Follows with nephrology, Dr. Clover Mealy and Dr. Lissa Merlin at [facility]. ?- Baseline Creatinine 1.4-1.7 ?- Recent Scr 2.0 at [facility] 04/13/21 ?- SCr 2.73 on admit>>2.4>>3.5\ -> 4.0   Follow closely w/ diuresis  ?- Continue prednisone, Prograf and CellCept.  ?- Tacrolimus level 12.9  ?- Plan as above -> CVVHD ? ?6. ? PNA  ?- WBC elevated 17>21K>pending - ? PNA on CXR ?- PCT 1.23 >>1.16 >1.25 ?- on empiric abx per IM ?  ?7. CAD ?- Multiple stents to LAD, RCA and LCX ?- Cath 04/22/20 for worsening HF symptoms with stable disease ?- No ASA given Eliquis  ?- Continue Plavix + statin. ?- No s/s ischemia ?- HS trop flat and low level, 43>>36>>35  ?  ?8. PAF/AFL  ?- no candidate for Ablation per EP given severe LAE (5.8 cm)  ?- in NSR currently  ?- continue amio 100 mg daily (reduced dose due to GI SE)  ?- Likely will need to switch Eliquis to IV heparin  ?  ?9. H/o PVCs  ?- Prior Zio w/ high burden, 11% ?- Burden reduced with amio and Mexiletine .  ?- continue AAD therapy  ?  ?9. Recurrent Pericarditis ?- on chronic colchicine ?- exam + friction rub +  recent pleuritic CP 3 wks ago ?- ESR normal, CRP elevated at 31  ?- mod effusion on echo, see above  ?-will stop colchicine with worsening renal function  ? ?CRITICAL CARE ?Performed by: [name]

## 2021-09-16 NOTE — Procedures (Signed)
Central Venous Catheter Insertion Procedure Note ? ?Roy Dyer  ?[id number]  ?1965-06-28 ? ?Date:09/16/21  ?Time:4:13 PM  ? ?Provider Performing:Alastor Kneale C Tamala Julian  ? ?Procedure: Insertion of Non-tunneled Central Venous Catheter(36556) with US guidance (19622)  ? ?Indication(s) ?Hemodialysis ? ?Consent ?Risks of the procedure as well as the alternatives and risks of each were explained to the patient and/or caregiver.  Consent for the procedure was obtained and is signed in the bedside chart ? ?Anesthesia ?Topical only with 1% lidocaine  ? ?Timeout ?Verified patient identification, verified procedure, site/side was marked, verified correct patient position, special equipment/implants available, medications/allergies/relevant history reviewed, required imaging and test results available. ? ?Sterile Technique ?Maximal sterile technique including full sterile barrier drape, hand hygiene, sterile gown, sterile gloves, mask, hair covering, sterile ultrasound probe cover (if used). ? ?Procedure Description ?Area of catheter insertion was cleaned with chlorhexidine and draped in sterile fashion.  With real-time ultrasound guidance a HD catheter was placed into the right internal jugular vein. Nonpulsatile blood flow and easy flushing noted in all ports.  The catheter was sutured in place and sterile dressing applied. ? ?Complications/Tolerance ?None; patient tolerated the procedure well. ?Chest X-ray is ordered to verify placement for internal jugular or subclavian cannulation.   Chest x-ray is not ordered for femoral cannulation. ? ?EBL ?Minimal ? ?Specimen(s) ?None ? ?

## 2021-09-17 DIAGNOSIS — R0902 Hypoxemia: Secondary | ICD-10-CM

## 2021-09-17 DIAGNOSIS — N179 Acute kidney failure, unspecified: Secondary | ICD-10-CM | POA: Diagnosis not present

## 2021-09-17 DIAGNOSIS — I5082 Biventricular heart failure: Secondary | ICD-10-CM | POA: Diagnosis not present

## 2021-09-17 DIAGNOSIS — J9601 Acute respiratory failure with hypoxia: Secondary | ICD-10-CM | POA: Diagnosis not present

## 2021-09-17 DIAGNOSIS — N189 Chronic kidney disease, unspecified: Secondary | ICD-10-CM | POA: Diagnosis not present

## 2021-09-17 LAB — POCT I-STAT 7, (LYTES, BLD GAS, ICA,H+H)
Acid-Base Excess: 1 mmol/L (ref 0.0–2.0)
Acid-Base Excess: 2 mmol/L (ref 0.0–2.0)
Bicarbonate: 25.5 mmol/L (ref 20.0–28.0)
Bicarbonate: 27.3 mmol/L (ref 20.0–28.0)
Calcium, Ion: 1.23 mmol/L (ref 1.15–1.40)
Calcium, Ion: 1.24 mmol/L (ref 1.15–1.40)
HCT: 44 % (ref 39.0–52.0)
HCT: 44 % (ref 39.0–52.0)
Hemoglobin: 15 g/dL (ref 13.0–17.0)
Hemoglobin: 15 g/dL (ref 13.0–17.0)
O2 Saturation: 90 %
O2 Saturation: 100 %
Patient temperature: 97.3
Patient temperature: 98.6
Potassium: 4.5 mmol/L (ref 3.5–5.1)
Potassium: 4.5 mmol/L (ref 3.5–5.1)
Sodium: 135 mmol/L (ref 135–145)
Sodium: 136 mmol/L (ref 135–145)
TCO2: 27 mmol/L (ref 22–32)
TCO2: 29 mmol/L (ref 22–32)
pCO2 arterial: 37.7 mmHg (ref 32–48)
pCO2 arterial: 45.2 mmHg (ref 32–48)
pH, Arterial: 7.437 (ref 7.35–7.45)
pH, Arterial: 7.389 (ref 7.35–7.45)
pO2, Arterial: 55 mmHg — ABNORMAL LOW (ref 83–108)
pO2, Arterial: 317 mmHg — ABNORMAL HIGH (ref 83–108)

## 2021-09-17 LAB — RENAL FUNCTION PANEL
Albumin: 2.4 g/dL — ABNORMAL LOW (ref 3.5–5.0)
Albumin: 2.3 g/dL — ABNORMAL LOW (ref 3.5–5.0)
Anion gap: 12 (ref 5–15)
Anion gap: 13 (ref 5–15)
BUN: 47 mg/dL — ABNORMAL HIGH (ref 6–20)
BUN: 32 mg/dL — ABNORMAL HIGH (ref 6–20)
CO2: 23 mmol/L (ref 22–32)
CO2: 22 mmol/L (ref 22–32)
Calcium: 8.6 mg/dL — ABNORMAL LOW (ref 8.9–10.3)
Calcium: 8.8 mg/dL — ABNORMAL LOW (ref 8.9–10.3)
Chloride: 100 mmol/L (ref 98–111)
Chloride: 100 mmol/L (ref 98–111)
Creatinine, Ser: 2.64 mg/dL — ABNORMAL HIGH (ref 0.61–1.24)
Creatinine, Ser: 2 mg/dL — ABNORMAL HIGH (ref 0.61–1.24)
GFR, Estimated: 28 mL/min — ABNORMAL LOW (ref >60)
GFR, Estimated: 39 mL/min — ABNORMAL LOW (ref >60)
Glucose, Bld: 128 mg/dL — ABNORMAL HIGH (ref 70–99)
Glucose, Bld: 178 mg/dL — ABNORMAL HIGH (ref 70–99)
Phosphorus: 4.9 mg/dL — ABNORMAL HIGH (ref 2.5–4.6)
Phosphorus: 3.3 mg/dL (ref 2.5–4.6)
Potassium: 4.9 mmol/L (ref 3.5–5.1)
Potassium: 4.7 mmol/L (ref 3.5–5.1)
Sodium: 135 mmol/L (ref 135–145)
Sodium: 135 mmol/L (ref 135–145)

## 2021-09-17 LAB — APTT: aPTT: 42 seconds — ABNORMAL HIGH (ref 24–36)

## 2021-09-17 LAB — COOXEMETRY PANEL
Carboxyhemoglobin: 1.8 % — ABNORMAL HIGH (ref 0.5–1.5)
Methemoglobin: 0.7 % (ref 0.0–1.5)
O2 Saturation: 81 %
Total hemoglobin: 13.2 g/dL (ref 12.0–16.0)

## 2021-09-17 LAB — CBC
HCT: 42.2 % (ref 39.0–52.0)
Hemoglobin: 12.8 g/dL — ABNORMAL LOW (ref 13.0–17.0)
MCH: 23.3 pg — ABNORMAL LOW (ref 26.0–34.0)
MCHC: 30.3 g/dL (ref 30.0–36.0)
MCV: 76.7 fL — ABNORMAL LOW (ref 80.0–100.0)
Platelets: 351 10*3/uL (ref 150–400)
RBC: 5.5 MIL/uL (ref 4.22–5.81)
RDW: 18.5 % — ABNORMAL HIGH (ref 11.5–15.5)
WBC: 12.8 10*3/uL — ABNORMAL HIGH (ref 4.0–10.5)
nRBC: 0 % (ref 0.0–0.2)

## 2021-09-17 LAB — GLUCOSE, CAPILLARY: Glucose-Capillary: 127 mg/dL — ABNORMAL HIGH (ref 70–99)

## 2021-09-17 LAB — CORTISOL: Cortisol, Plasma: 81.5 ug/dL

## 2021-09-17 LAB — MAGNESIUM: Magnesium: 2.4 mg/dL (ref 1.7–2.4)

## 2021-09-17 MED ORDER — "THROMBI-PAD 3""X3"" EX PADS": 1.0000 | MEDICATED_PAD | Freq: Once | CUTANEOUS | Status: DC

## 2021-09-17 MED ORDER — SALINE SPRAY 0.65 % NA SOLN
1.0000 | NASAL | Status: DC | PRN
  Filled 2021-09-17: qty 44

## 2021-09-17 MED ORDER — HEPARIN (PORCINE) 25000 UT/250ML-% IV SOLN
1100.0000 [IU]/h | INTRAVENOUS | Status: DC
  Administered 2021-09-17: 1000 [IU]/h via INTRAVENOUS
  Administered 2021-09-18: 700 [IU]/h via INTRAVENOUS
  Administered 2021-09-20: 750 [IU]/h via INTRAVENOUS
  Administered 2021-09-21: 1100 [IU]/h via INTRAVENOUS
  Filled 2021-09-17 (×4): qty 250

## 2021-09-17 MED ORDER — HEPARIN (PORCINE) 25000 UT/250ML-% IV SOLN
1000.0000 [IU]/h | INTRAVENOUS | Status: DC
  Administered 2021-09-17: 1000 [IU]/h via INTRAVENOUS
  Filled 2021-09-17: qty 250

## 2021-09-17 MED ORDER — "THROMBI-PAD 3""X3"" EX PADS"
1.0000 | MEDICATED_PAD | Freq: Once | CUTANEOUS | Status: DC
  Filled 2021-09-17: qty 1

## 2021-09-17 NOTE — Progress Notes (Signed)
? ? Advanced Heart Failure Rounding Note ? ?PCP-Cardiologist: [name], MD  ? ?Subjective:   ? ?Echo: EF 30-35% (stable), severe BAE, RV mod reduced, mod TR, Mod Pericardial Effusion w/o tamponade, IVC dilated, RVSP severely elevated 75 mmHg  ? ?[place] 09/14/21 ?RA = 11 (no significant v-waves) ?RV = 77/15 ?PA = 79/35 (55) ?PCW = 24 ?Fick cardiac output/index = 6.96/3.61 ?Thermo = 6.92/3.59 ?PVR = 4.5 WU ?FA sat = 99% ?PA sat = 71%, 77%, 79% ?SVC sat = 86% ?  ?Moved to ICU yesterday and CVVHD started. 2.7 L off over night. Weight down 12 pounds. Remains SOB and tachypneic. Seems confused.  ? ?Still on DBA 3.  ? ? ?Objective:   ?Weight Range: ?77.3 kg ?Body mass index is 26.69 kg/m?.  ? ?Vital Signs:   ?Temp:  [97.5 ?F (36.4 ?C)-97.7 ?F (36.5 ?C)] 97.5 ?F (36.4 ?C) (05/07 0400) ?Pulse Rate:  [72-85] 79 (05/07 0700) ?Resp:  [13-25] 18 (05/07 0700) ?BP: (103-132)/(63-80) 121/68 (05/07 0700) ?SpO2:  [94 %-100 %] 97 % (05/07 0700) ?Weight:  [77.3 kg] 77.3 kg (05/07 0600) ?Last BM Date : 10/08/2021 ? ?Weight change: ?Filed Weights  ? 09/15/21 0542 09/16/21 0524 09/17/21 0600  ?Weight: 82.7 kg 83 kg 77.3 kg  ? ? ?Intake/Output:  ? ?Intake/Output Summary (Last 24 hours) at 09/17/2021 0809 ?Last data filed at 09/17/2021 0800 ?Gross per 24 hour  ?Intake 1261.49 ml  ?Output 4613 ml  ?Net -3351.51 ml  ? ?  ? ? ?Physical Exam  ? ?General:  Sitting up in bed.  Tachypneic ?HEENT: normal ?Neck: supple. RIJ HD cath Carotids 2+ bilat; no bruits. No lymphadenopathy or thryomegaly appreciated. ?Cor: PMI nondisplaced. Regular rate & rhythm. 2/6 TR ?Lungs: dull at bases tachypneic  ?Abdomen: soft, nontender, nondistended. No hepatosplenomegaly. No bruits or masses. Good bowel sounds. ?Extremities: no cyanosis, clubbing, rash, 1+ edema ?Neuro: alert. Mildly confused, cranial nerves grossly intact. moves all 4 extremities w/o difficulty. Affect pleasant ? ? ?Telemetry  ?  ?SR 70-80s Personally reviewed ? ?Labs  ?  ?CBC ?Recent Labs  ?   09/16/21 ?0345 09/16/21 ?2139 09/17/21 ?0211  ?WBC 11.3*  --  12.8*  ?HGB 11.3* 13.9 12.8*  ?HCT 37.2* 41.0 42.2  ?MCV 76.5*  --  76.7*  ?PLT 300  --  351  ? ? ?Basic Metabolic Panel ?Recent Labs  ?  09/16/21 ?1637 09/16/21 ?2139 09/17/21 ?0211  ?NA 125* 134* 135  ?K 4.6 4.3 4.9  ?CL 91*  --  100  ?CO2 19*  --  23  ?GLUCOSE 311*  --  128*  ?BUN 63*  --  47*  ?CREATININE 3.50*  --  2.64*  ?CALCIUM 8.1*  --  8.6*  ?MG  --   --  2.4  ?PHOS 5.6*  --  4.9*  ? ? ?Liver Function Tests ?Recent Labs  ?  09/16/21 ?1637 09/17/21 ?0211  ?ALBUMIN 2.1* 2.4*  ? ? ?No results for input(s): LIPASE, AMYLASE in the last 72 hours. ?Cardiac Enzymes ?No results for input(s): CKTOTAL, CKMB, CKMBINDEX, TROPONINI in the last 72 hours. ? ? ?BNP: ?BNP (last 3 results) ?Recent Labs  ?  05/02/21 ?1234 10/01/2021 ?1617 09/14/21 ?0308  ?BNP 1,152.1* 1,553.9* 1,395.1*  ? ? ? ?ProBNP (last 3 results) ?No results for input(s): PROBNP in the last 8760 hours. ? ? ?D-Dimer ?No results for input(s): DDIMER in the last 72 hours. ?Hemoglobin A1C ?No results for input(s): HGBA1C in the last 72 hours. ?Fasting Lipid Panel ?No results  for input(s): CHOL, HDL, LDLCALC, TRIG, CHOLHDL, LDLDIRECT in the last 72 hours. ?Thyroid Function Tests ?No results for input(s): TSH, T4TOTAL, T3FREE, THYROIDAB in the last 72 hours. ? ?Invalid input(s): FREET3 ? ? ?Other results: ? ? ?Imaging  ? ? ?DG CHEST PORT 1 VIEW ? ?Result Date: 09/16/2021 ?CLINICAL DATA:  Shortness of breath. Respiratory failure. Central line placement. EXAM: PORTABLE CHEST 1 VIEW COMPARISON:  09/14/2021 FINDINGS: New right jugular dual-lumen central venous catheter is seen with tip overlying the superior cavoatrial junction. No evidence of pneumothorax. Stable cardiomegaly. Pacemaker remains in place. Worsening diffuse bilateral pulmonary airspace disease is seen, likely due to pulmonary edema. IMPRESSION: New right jugular dual-lumen central venous catheter in appropriate position. No evidence of  pneumothorax. Worsening diffuse bilateral pulmonary airspace disease, likely due to pulmonary edema. Electronically Signed   By: Marlaine Hind M.D.   On: 09/16/2021 14:09   ? ? ?Medications:   ? ? ?Scheduled Medications: ? amiodarone  100 mg Oral Daily  ? apixaban  5 mg Oral BID  ? atorvastatin  80 mg Oral QHS  ? calcium acetate  667 mg Oral TID WC  ? chlorhexidine  15 mL Mouth Rinse BID  ? Chlorhexidine Gluconate Cloth  6 each Topical Daily  ? clopidogrel  75 mg Oral Daily  ? famotidine  20 mg Oral QHS  ? feeding supplement  237 mL Oral TID BM  ? fluticasone  2 spray Each Nare Daily  ? guaiFENesin  600 mg Oral BID  ? hydrocortisone sod succinate (SOLU-CORTEF) inj  75 mg Intravenous Q12H  ? mouth rinse  15 mL Mouth Rinse q12n4p  ? mexiletine  300 mg Oral BID  ? midodrine  5 mg Oral TID WC  ? multivitamin with minerals  1 tablet Oral Daily  ? mycophenolate  750 mg Oral BID  ? pantoprazole  40 mg Oral Daily  ? PARoxetine  20 mg Oral QPM  ? sodium chloride flush  3 mL Intravenous Q12H  ? sodium chloride flush  3 mL Intravenous Q12H  ? sodium chloride flush  3 mL Intravenous Q12H  ? tacrolimus ER  0.75 mg Oral Daily  ? urea   Topical BID  ? ? ?Infusions: ?  prismasol BGK 4/2.5 400 mL/hr at 09/17/21 0300  ?  prismasol BGK 4/2.5 400 mL/hr at 09/17/21 0300  ? sodium chloride    ? sodium chloride 7.5 mL/hr at 09/16/21 1700  ? DOBUTamine 3 mcg/kg/min (09/17/21 0800)  ? heparin 10,000 units/ 20 mL infusion syringe 500 Units/hr (09/17/21 0800)  ? norepinephrine (LEVOPHED) Adult infusion    ? prismasol BGK 4/2.5 1,500 mL/hr at 09/17/21 0650  ? ? ?PRN Medications: ?sodium chloride, sodium chloride, acetaminophen, albuterol, heparin, heparin, HYDROcodone-acetaminophen, melatonin, ondansetron (ZOFRAN) IV, ondansetron **OR** [DISCONTINUED] ondansetron (ZOFRAN) IV, polyethylene glycol, sodium chloride flush, sodium chloride flush ? ? ? ?Patient Profile  ? ?56 y/o male w/ chronic systolic heart failure due to ICM, CAD, h/o  pericarditis, Stage IIIa CKD s/p renal transplant in 2012 due to SLE, PAF/AFL and frequent PVCs admitted for a/c CHF.  ?  ? ?Assessment/Plan  ? ?Acute on Chronic Biventricular Heart Failure ?- HX ICD in 2012 ?- Echo 12/17 EF 30%  ?- Cath 04/22/20 for worsening HF symptoms. Stable CAD. ?- Echo 6/22 EF 30-35%, Grade III DD, severe LAE, mild/mod TR ?- Echo 02/04/21: EF 30-35% No effusion, RV normal. ?- Echo 3/23: EF 35%, RV mod reduced, severe TR ?- Admitted w/ a/c CHF w/  worsening  NYHA Functional Class IIIb and fluid overload w/ AKI on CKD  ?- Echo EF 30-35% (stable), severe BAE, RV mod reduced, mod TR, Mod Pericardial Effusion w/o tamponade, IVC dilated, RVSP severely elevated 75 mmHg  ?- RHC w/ elevated biventricular filling pressures w/ high CO in setting of known peripheral shunt (AVF) + severe mixed pulmonary HTN, RA 11, PCWP 24 ?- No benefit from milrinone or DBA. Based on RHC low output does not seem to be the issue ?- Renal function and volume status worsened. Moved to ICU on 5/6 for CVVHD  ?- Weight down 12 pounds overnight. Still overloaded. Continue CVVHD ? ?2. Pericardial Effusion ?- moderate on echo. No tamponade. BP stable  ?- Suspect 2/2 pericarditis + CHF  ?- Likely related to renal failure/volume overload. Should improve with HD ?- Will repeat limited echo prio to d/c ?  ?3. Mod- Severe Tricuspid Regurgitation  ?- Recent progression, Echo 9/22 TR moderate ?- Echo 3/23 TR Severe  ?- Mod on Echo this admit  ?- functional in setting of dilated RV ?- no significant v-waves on RHC  ?- HF optimization per above ?  ?4. Pulmonary HTN  ?- Echo 3/23 w/ severely elevated RVSP, 68 mmHg and severe TR.  ?- Echo this admit RVSP 75  ?- Combination WHO Groups 2+3.  ?- Given H/o SLE ? WHO Group 1 component  ?- RHC w/ severe mixed pulmonary HTN, PVR = 4.5 WU ?- V/Q scan 9/22 Negative for PE  ?- on CPAP for OSA. Reports intermittent compliance  ?- Will improve some with volume removal ?  ?5. AKI no CKD Stage IIIa S/p   Renal transplant in 2012 (due to SLE): ?- Follows with nephrology, Dr. Clover Mealy and Dr. Lissa Merlin at [facility]. ?- Baseline Creatinine 1.4-1.7 ?- Recent Scr 2.0 at [facility] 04/13/21 ?- SCr 2.73 on admit>>2.4>>3.5\ -> 4.0   Follow closely w/

## 2021-09-17 NOTE — Progress Notes (Signed)
eLink Physician-Brief Progress Note ?Patient Name: Roy Dyer ?DOB: 1965/12/18 ?MRN: [id number] ? ? ?Date of Service ? 09/17/2021  ?HPI/Events of Note ? ABG review. Now on HF o2 at 30 L/100%.  ?RN asking for a art line. ? ?Camera: ?Discussed with RN. ?Patient says unable to breath. VS stable. Sinus rhythm. ? ?ABG: ?Pco2 at 37. ?Cr 2.  ?Hg 15.  ?Able to protect airways. Confused.  ? ? ?CHF/KtxCAP. Lupus hx.  ?CRRT  ?  ?eICU Interventions ? - BiPAP ordered. Asp precautions ?- ABG after an hour.  ?No need for art line at this time.   ? ? ? ?Intervention Category ?Intermediate Interventions: Respiratory distress - evaluation and management;Other: ? ?Elmer Sow ?09/17/2021, 8:05 PM ?

## 2021-09-17 NOTE — Progress Notes (Addendum)
ANTICOAGULATION CONSULT NOTE - Initial Consult ? ?Pharmacy Consult for apixaban>heparin ?Indication: atrial fibrillation ? ?Allergies  ?Allergen Reactions  ? Triptans Palpitations  ?  Reaction to Maxalt  ? ? ?Patient Measurements: ?Height: _0  (170.2 cm) ?Weight: 77.3 kg (170 lb 6.7 oz) ?IBW/kg (Calculated) : 66.1 ?Heparin Dosing Weight: 77kg ? ?Vital Signs: ?Temp: 97.5 ?F (36.4 ?C) (05/07 0400) ?Temp Source: Axillary (05/07 0400) ?BP: 117/67 (05/07 0800) ?Pulse Rate: 78 (05/07 0800) ? ?Labs: ?Recent Labs  ?  09/15/21 ?5945 09/16/21 ?0345 09/16/21 ?1637 09/16/21 ?2139 09/17/21 ?0211  ?HGB 12.2* 11.3*  --  13.9 12.8*  ?HCT 39.5 37.2*  --  41.0 42.2  ?PLT 299 300  --   --  351  ?APTT  --   --   --   --  42*  ?CREATININE 3.48* 3.98* 3.50*  --  2.64*  ? ? ?Estimated Creatinine Clearance: 29.9 mL/min (A) (by C-G formula based on SCr of 2.64 mg/dL (H)). ? ? ?Medical History: ?Past Medical History:  ?Diagnosis Date  ? AICD (automatic cardioverter/defibrillator) present   ? Dr. Paschal Dopp follows remotely-yrly checks, Dr. Nishan,cardiology  ? Atrial flutter ([place]) 01/2021  ? dx'd when in hosp for pericarditis->amiodarone+eliquis  ? Avascular necrosis of bone of hip ([place]) 2010 surg  ? Left hip arthroplasty: from chronic systemic steroids taken for his Lupus  ? Barrett's esophagus 2017; 2021  ? Blood transfusion without reported diagnosis 2010, 2012  ? CAD (coronary artery disease)   ? **DAPT long term recommended by cards**.  a. stents RCA/Circ 2001 b. BMS to LAD 07/2010  c. 01/2017: s/p DES to RCA.   ? Cataract   ? left eye  ? CHF (congestive heart failure) ([place])   ? Chronic headaches 02/2017  ? As of 01/2017, HA's no better, neurologist ordered CT.  ESR normal at that time.  HA'S COMPLETELY RESOLVED AFTER HE GOT ON CPAP 2019/2020.  ? Chronic renal insufficiency, stage 3 (moderate) (HCC)   ? GFR 55-60 (sCr 1.4-1.7 range)  ? Erythrocytosis   ? Pre and post transplant->on losartan for this  ? GERD (gastroesophageal reflux disease)    ? Hx of esoph stricture and dilatation.  +Barrett's esophagus+hx of aspiration pneumonia-->to get rpt EGD when he comes off DAPT (utd as of 01/29/18)  ? Gout   ? s/p renal transplant he was weened off of his allopurinol.  ? Hiatal hernia   ? History of adenomatous polyp of colon 09/2019  ? polyp x 1. Recall 5 yrs  ? 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 Secondary to SLE: HD 06/2011-10/2012 (then got renal transplant)  ? History of renal transplant 11/10/2012  ? 10/2012 [facility]   ? Hyperlipidemia   ? Hypertension   ? implantable cardiac defibrillator single chamber   ? Medtronic (due to low EF)  ? Ischemic cardiomyopathy   ? Chronic systolic dysfunction--  85/9292 EF 30-35%.   Single chamber ICD 11/20/10 ([name])  ? LBBB (left bundle branch block) 2023  ? Left ventricular thrombus 2012  ? Re-eval 02/2011 showed thrombus RESOLVED, so coumadin was d/c'd (was on it for 48mo  ? Lupus ([place]   ? OSA on CPAP 2018  ? Dr. [name]9/2018: +OSA on home sleep studay; CPAP auto titrate 5-20 started 05/2017.  ? Osteoporosis   ? Pericarditis associated with systemic lupus erythematosus ([place] 01/2021  ? hosp->colchicine  ? Prediabetes 12/2019  ? A1c stable long term at 5.6-5.7 until jump up to 6.2% Aug 2021.  ? ?  Assessment: ?56 year old male with history of afib on chronic apixaban. Also has history of SLE and post kidney transplant currently in volume overload and renal failure, transferred to ICU yesterday 5/6 to start crrt. Will transition patient over to heparin to better closely manage anticoagulation now that he is also on heparin syringe for renal replacement. CBC has been stable, no bleeding issues noted this admit.  ? ?Will need to follow and dose adjust based on aptt levels due to apixaban interference with heparin levels.  ? ?Goal of Therapy:  ?Heparin level 0.3-0.7 units/ml ?aPTT 66-102 seconds ?Monitor platelets by anticoagulation protocol: Yes ?  ?Plan:  ?Start heparin infusion at 1000 units/hr ?Check aptt in 8  hours and daily heparin level, cbc and aptt while on heparin ? ?Erin Hearing PharmD., BCPS ?Clinical Pharmacist ?09/17/2021 9:06 AM ? ?Addendum: ?Rn noted lots of bleeding/oozing around catheter sites. Patient on plavix, heparin through crrt, and likely still has apixaban on board. Will hold on heparin today and restart tonight. ? ?09/17/2021 11:06 AM ? ? ?

## 2021-09-17 NOTE — Progress Notes (Signed)
? ?  NAME:  Roy Dyer, MRN:  [id number], DOB:  1966/03/31, LOS: 6 ?ADMISSION DATE:  09/16/2021, CONSULTATION DATE:  09/16/21 ?REFERRING MD:  TRH, CHIEF COMPLAINT:  SOB  ? ?History of Present Illness:  ?56 year old man with hx of ESRD s/p renal transplant now with CKD, CAD, Afib who presented with insidious onset SOB.  Found to be in biventricular failure, failed diuretic+inotrope trial.  Due to low Bps, worsening dyspnea and edema, transferred to ICU and PCCM to assume care from [facility]. ? ?Patient doing okay as long as he sits up, dyspnea with minimal movement. ? ?Pertinent  Medical History  ?History of renal transplant ?Aflutter ?SLE ?CAD ?OSA ? ? ?Significant Hospital Events: ?Including procedures, antibiotic start and stop dates in addition to other pertinent events   ?5/1 admitted ?5/3 R/HC ?[place] 09/14/21 ?RA = 11 (no significant v-waves) ?RV = 77/15 ?PA = 79/35 (55) ?PCW = 24 ?Fick cardiac output/index = 6.96/3.61 ?Thermo = 6.92/3.59 ?PVR = 4.5 WU ?RA sat = 99% ?PA sat = 71%, 77%, 79% ?SVC sat = 86% ?5/6 worsening resp distress, to ICU, CRRT started ? ?Interim History / Subjective:  ?Mental status more clear this am ?Still needs good bit on oxygen, now on 15LPM ?Thinks breathing is slightly better ?Denies cough, fever, chills ?CVP 8? ? ?Objective   ?Blood pressure 122/80, pulse 77, temperature (!) 97.5 ?F (36.4 ?C), temperature source Axillary, resp. rate 18, height '5\' 7"'$  (1.702 m), weight 77.3 kg, SpO2 100 %. ?CVP:  [8 mmHg-10 mmHg] 8 mmHg  ?   ? ?Intake/Output Summary (Last 24 hours) at 09/17/2021 0942 ?Last data filed at 09/17/2021 0900 ?Gross per 24 hour  ?Intake 1265.21 ml  ?Output 4891 ml  ?Net -3625.79 ml  ? ? ?Filed Weights  ? 09/15/21 0542 09/16/21 0524 09/17/21 0600  ?Weight: 82.7 kg 83 kg 77.3 kg  ? ? ?Examination: ?No distress ?MMM, trachea midline ?RRR, ext warm ?Minimal edema ?Continued crackles at bases ?Moves all 4 ext to command ?Oriented to self/place/year for me ? ?BMP improved with CRRT ?CBC benign ?CXR  pending ? ?Resolved Hospital Problem list   ?N/A ? ?Assessment & Plan:  ?Acute on chronic biventricular failure- preserved output, exacerbated by high flow from AVF. 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 ?- CRRT with inotropes neg pull per CHF and nephro discussion ?- Titrate O2 for sats > 90% ?- Continue midodrine ?- Completed 5 days abx ?- Heparin gtt for now ?- Should we consider fistula ligation? ? ?Best Practice (right click and "Reselect all SmartList Selections" daily)  ? ?Diet/type: Regular consistency (see orders) ?DVT prophylaxis: heparin gtt ?GI prophylaxis: H2B ?Lines: Dialysis Catheter ?Foley:  N/A ?Code Status:  full code ?Last date of multidisciplinary goals of care discussion [Pending] ? ?Erskine Emery MD PCCM ? ? ?

## 2021-09-17 NOTE — Progress Notes (Addendum)
Patient exhibiting worsening confusion. Dr. Tamala Julian made aware, ABG ordered. Order received for heated high flow, RT notified. ?

## 2021-09-17 NOTE — Progress Notes (Signed)
Patient ID: Roy Dyer, male   DOB: 1966-01-21, 56 y.o.   MRN: [id number] ?[facility] ?Progress Note  ? ?Assessment/ Plan:   ?1. 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  Presented with acute kidney injury that was suspected to be from CHF decompensation however, continued to worsen with diuretic refractoriness.  Yesterday transferred to ICU for CRRT which she has tolerated well overnight with net -2.7 L fluid balance overnight, continue this for additional extracorporeal volume unloading.  Unfortunately, I fear that he will have some stepwise progression of chronic kidney disease of his renal allograft with the hemodynamic injury.  Optimistic that he will be able to come off of renal replacement therapy at some point. ?2.  Acute exacerbation of congestive heart failure: Right heart catheterization yesterday showed elevated biventricular pressures with high cardiac output and severe mixed pulmonary hypertension.  Started on CRRT for extracorporeal volume unloading after refractory to diuretics. ?3.  Status post cadaveric kidney transplant: Continue maintenance immunosuppressive therapy with Envarsus, mycophenolate and prednisone.  With acute kidney injury that appears to be hemodynamically mediated in the setting of CHF exacerbation.  Unfortunately, needing to limit fluid intake and currently on CRRT for extracorporeal volume unloading after he was refractory to diuretics. ?4.  Hyponatremia: Secondary to CHF exacerbation and acute kidney injury, improving with volume unloading/CRRT. ? ?Subjective:   ?Transferred to ICU yesterday for initiation of CRRT because of diuretic refractory volume overload/congestive heart failure and worsening renal function.  ? ?Objective:   ?BP 121/68   Pulse 79   Temp (!) 97.5 ?F (36.4 ?C) (Axillary)   Resp  18   Ht '5\' 7"'$  (1.702 m)   Wt 77.3 kg   SpO2 97%   BMI 26.69 kg/m?  ? ?Intake/Output Summary (Last 24 hours) at 09/17/2021 0759 ?Last data filed at 09/17/2021 2778 ?Gross per 24 hour  ?Intake 1617.77 ml  ?Output 4410 ml  ?Net -2792.23 ml  ? ?Weight change: -5.663 kg ? ?Physical Exam: ?Gen: Appears comfortable while sitting up in bed, awake/alert but gets dyspneic with conversation ?CVS: Pulse regular rhythm and normal rate.  Pericardial rub audible over the precordium ?Resp: Poor inspiratory effort with decreased breath sounds over both lung bases ?Abd: Soft, flat, nontender, bowel sounds normal ?Ext: Trace lower extremity edema, palpable thrill over left upper arm BCF ? ?Imaging: ?DG CHEST PORT 1 VIEW ? ?Result Date: 09/16/2021 ?CLINICAL DATA:  Shortness of breath. Respiratory failure. Central line placement. EXAM: PORTABLE CHEST 1 VIEW COMPARISON:  09/14/2021 FINDINGS: New right jugular dual-lumen central venous catheter is seen with tip overlying the superior cavoatrial junction. No evidence of pneumothorax. Stable cardiomegaly. Pacemaker remains in place. Worsening diffuse bilateral pulmonary airspace disease is seen, likely due to pulmonary edema. IMPRESSION: New right jugular dual-lumen central venous catheter in appropriate position. No evidence of pneumothorax. Worsening diffuse bilateral pulmonary airspace disease, likely due to pulmonary edema. Electronically Signed   By: Marlaine Hind M.D.   On: 09/16/2021 14:09   ? ?Labs: ?BMET ?Recent Labs  ?Lab 09/25/2021 ?2202 10/01/2021 ?2224 09/12/21 ?0408 09/19/2021 ?2423 10/01/2021 ?1329 10/06/2021 ?1334 09/14/21 ?0308 09/15/21 ?5361 09/16/21 ?0345 09/16/21 ?1637 09/16/21 ?2139 09/17/21 ?0211  ?NA  --    < > 131* 135   < > 138 133* 133* 131* 125* 134* 135  ?K  --    < > 4.4 3.8   < >  3.3* 4.1 4.6 4.1 4.6 4.3 4.9  ?CL  --   --  95* 98  --   --  99 98 94* 91*  --  100  ?CO2  --   --  23 24  --   --  19* 23 22 19*  --  23  ?GLUCOSE  --   --  106* 106*  --   --  159* 120* 102* 311*   --  128*  ?BUN  --   --  43* 43*  --   --  49* 61* 70* 63*  --  47*  ?CREATININE  --   --  2.73* 2.40*  --   --  2.40* 3.48* 3.98* 3.50*  --  2.64*  ?CALCIUM  --   --  8.4* 8.3*  --   --  8.6* 8.5* 8.5* 8.1*  --  8.6*  ?PHOS 4.8*  --  5.0*  --   --   --  4.5  --  6.2* 5.6*  --  4.9*  ? < > = values in this interval not displayed.  ? ?CBC ?Recent Labs  ?Lab 10/09/2021 ?1617 10/02/2021 ?2224 09/14/21 ?0308 09/15/21 ?2536 09/16/21 ?0345 09/16/21 ?2139 09/17/21 ?0211  ?WBC 17.3*   < > 21.2* 16.2* 11.3*  --  12.8*  ?NEUTROABS 14.9*  --  18.0*  --   --   --   --   ?HGB 13.9   < > 13.2 12.2* 11.3* 13.9 12.8*  ?HCT 47.9   < > 42.5 39.5 37.2* 41.0 42.2  ?MCV 79.2*   < > 77.1* 78.8* 76.5*  --  76.7*  ?PLT 408*   < > 354 299 300  --  351  ? < > = values in this interval not displayed.  ? ? ?Medications:   ? ? amiodarone  100 mg Oral Daily  ? apixaban  5 mg Oral BID  ? atorvastatin  80 mg Oral QHS  ? calcium acetate  667 mg Oral TID WC  ? chlorhexidine  15 mL Mouth Rinse BID  ? Chlorhexidine Gluconate Cloth  6 each Topical Daily  ? clopidogrel  75 mg Oral Daily  ? famotidine  20 mg Oral QHS  ? feeding supplement  237 mL Oral TID BM  ? fluticasone  2 spray Each Nare Daily  ? guaiFENesin  600 mg Oral BID  ? hydrocortisone sod succinate (SOLU-CORTEF) inj  75 mg Intravenous Q12H  ? mouth rinse  15 mL Mouth Rinse q12n4p  ? mexiletine  300 mg Oral BID  ? midodrine  5 mg Oral TID WC  ? multivitamin with minerals  1 tablet Oral Daily  ? mycophenolate  750 mg Oral BID  ? pantoprazole  40 mg Oral Daily  ? PARoxetine  20 mg Oral QPM  ? sodium chloride flush  3 mL Intravenous Q12H  ? sodium chloride flush  3 mL Intravenous Q12H  ? sodium chloride flush  3 mL Intravenous Q12H  ? tacrolimus ER  0.75 mg Oral Daily  ? urea   Topical BID  ? ?Elmarie Shiley, MD ?09/17/2021, 7:59 AM  ? ?

## 2021-09-18 ENCOUNTER — Inpatient Hospital Stay (HOSPITAL_COMMUNITY): Payer: Managed Care, Other (non HMO)

## 2021-09-18 DIAGNOSIS — J9601 Acute respiratory failure with hypoxia: Secondary | ICD-10-CM | POA: Diagnosis not present

## 2021-09-18 DIAGNOSIS — N189 Chronic kidney disease, unspecified: Secondary | ICD-10-CM | POA: Diagnosis not present

## 2021-09-18 DIAGNOSIS — R0902 Hypoxemia: Secondary | ICD-10-CM | POA: Diagnosis not present

## 2021-09-18 DIAGNOSIS — N179 Acute kidney failure, unspecified: Secondary | ICD-10-CM | POA: Diagnosis not present

## 2021-09-18 DIAGNOSIS — I5082 Biventricular heart failure: Secondary | ICD-10-CM | POA: Diagnosis not present

## 2021-09-18 LAB — CBC
HCT: 45.2 % (ref 39.0–52.0)
Hemoglobin: 13.3 g/dL (ref 13.0–17.0)
MCH: 23 pg — ABNORMAL LOW (ref 26.0–34.0)
MCHC: 29.4 g/dL — ABNORMAL LOW (ref 30.0–36.0)
MCV: 78.1 fL — ABNORMAL LOW (ref 80.0–100.0)
Platelets: 448 10*3/uL — ABNORMAL HIGH (ref 150–400)
RBC: 5.79 MIL/uL (ref 4.22–5.81)
RDW: 19.3 % — ABNORMAL HIGH (ref 11.5–15.5)
WBC: 17.3 10*3/uL — ABNORMAL HIGH (ref 4.0–10.5)
nRBC: 0.1 % (ref 0.0–0.2)

## 2021-09-18 LAB — RENAL FUNCTION PANEL
Albumin: 2.3 g/dL — ABNORMAL LOW (ref 3.5–5.0)
Albumin: 2.5 g/dL — ABNORMAL LOW (ref 3.5–5.0)
Anion gap: 8 (ref 5–15)
Anion gap: 10 (ref 5–15)
BUN: 29 mg/dL — ABNORMAL HIGH (ref 6–20)
BUN: 24 mg/dL — ABNORMAL HIGH (ref 6–20)
CO2: 26 mmol/L (ref 22–32)
CO2: 27 mmol/L (ref 22–32)
Calcium: 9 mg/dL (ref 8.9–10.3)
Calcium: 9.1 mg/dL (ref 8.9–10.3)
Chloride: 101 mmol/L (ref 98–111)
Chloride: 101 mmol/L (ref 98–111)
Creatinine, Ser: 1.88 mg/dL — ABNORMAL HIGH (ref 0.61–1.24)
Creatinine, Ser: 1.83 mg/dL — ABNORMAL HIGH (ref 0.61–1.24)
GFR, Estimated: 42 mL/min — ABNORMAL LOW (ref >60)
GFR, Estimated: 43 mL/min — ABNORMAL LOW (ref >60)
Glucose, Bld: 189 mg/dL — ABNORMAL HIGH (ref 70–99)
Glucose, Bld: 132 mg/dL — ABNORMAL HIGH (ref 70–99)
Phosphorus: 2.9 mg/dL (ref 2.5–4.6)
Phosphorus: 2.4 mg/dL — ABNORMAL LOW (ref 2.5–4.6)
Potassium: 6 mmol/L — ABNORMAL HIGH (ref 3.5–5.1)
Potassium: 4.6 mmol/L (ref 3.5–5.1)
Sodium: 135 mmol/L (ref 135–145)
Sodium: 138 mmol/L (ref 135–145)

## 2021-09-18 LAB — POTASSIUM: Potassium: 5.1 mmol/L (ref 3.5–5.1)

## 2021-09-18 LAB — MAGNESIUM: Magnesium: 2.8 mg/dL — ABNORMAL HIGH (ref 1.7–2.4)

## 2021-09-18 LAB — PROCALCITONIN: Procalcitonin: 1.72 ng/mL

## 2021-09-18 LAB — APTT
aPTT: 108 seconds — ABNORMAL HIGH (ref 24–36)
aPTT: 109 seconds — ABNORMAL HIGH (ref 24–36)

## 2021-09-18 LAB — HEPARIN LEVEL (UNFRACTIONATED): Heparin Unfractionated: >1.1 IU/mL — ABNORMAL HIGH (ref 0.30–0.70)

## 2021-09-18 MED ORDER — PREDNISONE 10 MG PO TABS
5.0000 mg | ORAL_TABLET | Freq: Every day | ORAL | Status: DC
  Administered 2021-09-19: 5 mg via ORAL
  Filled 2021-09-18: qty 1

## 2021-09-18 MED ORDER — RENA-VITE PO TABS: 1.0000 | ORAL_TABLET | Freq: Every day | ORAL | Status: DC

## 2021-09-18 MED ORDER — BACLOFEN 5 MG HALF TABLET
5.0000 mg | ORAL_TABLET | Freq: Three times a day (TID) | ORAL | Status: DC | PRN
  Filled 2021-09-18: qty 1

## 2021-09-18 MED ORDER — SODIUM PHOSPHATES 45 MMOLE/15ML IV SOLN
15.0000 mmol | Freq: Once | INTRAVENOUS | Status: AC
  Administered 2021-09-18: 15 mmol via INTRAVENOUS
  Filled 2021-09-18: qty 5

## 2021-09-18 MED ORDER — PRISMASOL BGK 0/2.5 32-2.5 MEQ/L EC SOLN
Status: DC
  Filled 2021-09-18 (×3): qty 5000

## 2021-09-18 MED ORDER — CLONAZEPAM 0.25 MG PO TBDP
0.5000 mg | ORAL_TABLET | Freq: Every day | ORAL | Status: DC
  Administered 2021-09-18 – 2021-09-19 (×2): 0.5 mg via ORAL
  Filled 2021-09-18 (×2): qty 2

## 2021-09-18 MED ORDER — PRISMASOL BGK 0/2.5 32-2.5 MEQ/L EC SOLN
Status: DC
  Filled 2021-09-18 (×11): qty 5000

## 2021-09-18 MED ORDER — DOBUTAMINE IN D5W 4-5 MG/ML-% IV SOLN
1.0000 ug/kg/min | INTRAVENOUS | Status: DC
  Administered 2021-09-18: 1 ug/kg/min via INTRAVENOUS
  Filled 2021-09-18: qty 250

## 2021-09-18 NOTE — Progress Notes (Signed)
Remote ICD transmission.   

## 2021-09-18 NOTE — Progress Notes (Addendum)
ANTICOAGULATION CONSULT NOTE ? ?Pharmacy Consult for heparin ?Indication: atrial fibrillation ? ?Allergies  ?Allergen Reactions  ? Triptans Palpitations  ?  Reaction to Maxalt  ? ? ?Patient Measurements: ?Height: '5\' 7"'$  (170.2 cm) ?Weight: 72.7 kg (160 lb 4.4 oz) ?IBW/kg (Calculated) : 66.1 ?Heparin Dosing Weight: 77kg ? ?Vital Signs: ?Temp: 97.7 ?F (36.5 ?C) (05/08 2000) ?Temp Source: Oral (05/08 2000) ?BP: 116/64 (05/08 2000) ?Pulse Rate: 78 (05/08 2042) ? ?Labs: ?Recent Labs  ?  09/16/21 ?0345 09/16/21 ?1637 09/17/21 ?0211 09/17/21 ?1643 09/17/21 ?1906 09/17/21 ?2113 09/18/21 ?1062 09/18/21 ?1537 09/18/21 ?2004  ?HGB 11.3*   < > 12.8*  --  15.0 15.0 13.3  --   --   ?HCT 37.2*   < > 42.2  --  44.0 44.0 45.2  --   --   ?PLT 300  --  351  --   --   --  448*  --   --   ?APTT  --   --  42*  --   --   --  108*  --  109*  ?HEPARINUNFRC  --   --   --   --   --   --  >1.10*  --   --   ?CREATININE 3.98*   < > 2.64* 2.00*  --   --  1.88* 1.83*  --   ? < > = values in this interval not displayed.  ? ? ? ?Estimated Creatinine Clearance: 43.1 mL/min (A) (by C-G formula based on SCr of 1.83 mg/dL (H)). ? ?Assessment: ?56 y.o. male with history of Afib, Eliquis on hold, for heparin.  aPTT remains supra-therapeutic.  RN reports increased bleeding from dialysis catheter.  Aim for lower goals with bleeding. ? ?Goal of Therapy:  ?Heparin level 0.3-0.5 units/ml ?aPTT 66-85 seconds ?Monitor platelets by anticoagulation protocol: Yes ?  ?Plan:  ?Reduce systemic heparin to 750 units/hr ?Currently getting heparin through CRRT at 500 units/hr ?Consider consolidating heparin ?F/U AM labs ?Monitor for bleeding resolution ? ?Dafne Nield D. Mina Marble, PharmD, BCPS, BCCCP ?09/18/2021, 9:39 PM ? ?

## 2021-09-18 NOTE — Progress Notes (Signed)
Initial Nutrition Assessment ? ?DOCUMENTATION CODES:  ? ?Non-severe (moderate) malnutrition in context of chronic illness ? ?INTERVENTION:  ? ?Add Renal MVI daily ? ?Ensure Enlive po to TID, each supplement provides 350 kcal and 20 grams of protein. ? ?Consider removal of fluid restriction for now while on CRRT if impacting po intake negatively ? ?If pt continues with inadequate po intake, may need to consider Cortrak  placement ? ?Recommend discontinuing PhosLo (phosphorus binder for now); pt on CRRT, phosphorus level supplemented this AM ? ? ?NUTRITION DIAGNOSIS:  ? ?Moderate Malnutrition related to chronic illness (pericarditis, cardiomyopathy, CAD, renal disease, lupus) as evidenced by mild fat depletion, moderate muscle depletion, energy intake < or equal to 75% for > or equal to 1 month, percent weight loss. ? ?Being addressed via supplements ? ?GOAL:  ? ?Patient will meet greater than or equal to 90% of their needs ? ?Progressing ? ?MONITOR:  ? ?PO intake, Supplement acceptance, Labs, Weight trends, I & O's, Diet advancement ? ?REASON FOR ASSESSMENT:  ? ?Consult ?Assessment of nutrition requirement/status ? ?ASSESSMENT:  ? ?Pt admitted with acute respiratory failure with hypoxia. PMH significant for aflutter, pleuritic chest pain d/t pericarditis, ischemic cardiomyopathy, CAD with multiple stents, CKD, afib, s/p renal transplant, prediabetes, lupus s/p AICD, avascular necrosis or the hip, Barrett's esophagus on PPI. ? ? ?5/01 Admitted ?5/03 RHC ?5/06 Transferred to ICU for initiation of CRRT ? ?Pt confused, family reports pt has been hallucination.  Currently on HFNC; CRRT with UF 100-150 ml/hr, lowering potassium bath due to hyperkalemia, repeat level 4.6 ? ?Recorded po intake 10-15% of meals, Ensure ordered BID ? ?Phosphorus trending down, now 2.4, supplemented today. Need to monitor as phosphorus will likely continue to trend down while on CRRT. Pt also on PhosLo with meals-recommend discontinuing  phosphorus binder for now ? ?Current wt 72.7 kg; weight of 83 kg upon initiation of CRRT. Net negative 7 L per I/O flow sheet. Down 10 kg per weight ? ?Labs: phosphorus 2.4 (5.6 2 days ago) ?Meds: sodium phosphate, prednisone, cellcept, tacrolimus ER, PhosLo with meals ? ? ?Diet Order:   ?Diet Order   ? ?       ?  Diet regular Room service appropriate? Yes; Fluid consistency: Thin; Fluid restriction: 1500 mL Fluid  Diet effective now       ?  ? ?  ?  ? ?  ? ? ?EDUCATION NEEDS:  ? ?Education needs have been addressed ? ?Skin:  Skin Assessment: Reviewed RN Assessment ? ?Last BM:  5/2 ? ?Height:  ? ?Ht Readings from Last 1 Encounters:  ?09/15/2021 '5\' 7"'$  (1.702 m)  ? ? ?Weight:  ? ?Wt Readings from Last 1 Encounters:  ?09/18/21 72.7 kg  ? ? ?Ideal Body Weight:    ? ?BMI:  Body mass index is 25.1 kg/m?. ? ?Estimated Nutritional Needs:  ? ?Kcal:  2300-2500 ? ?Protein:  115-130g ? ?Fluid:  >/=2.3L ? ? ?Kerman Passey MS, RDN, LDN, CNSC ?Registered Dietitian III ?Clinical Nutrition ?RD Pager and On-Call Pager Number Located in [place]  ? ?

## 2021-09-18 NOTE — Progress Notes (Signed)
? ?NAME:  LEMARCUS Dyer, MRN:  [id number], DOB:  [birth year], LOS: 7 ?ADMISSION DATE:  10/07/2021, CONSULTATION DATE:  09/16/21 ?REFERRING MD:  TRH, CHIEF COMPLAINT:  SOB  ? ?History of Present Illness:  ?56 year old man with hx of ESRD s/p renal transplant now with CKD, CAD, Afib who presented with insidious onset SOB.  Found to be in biventricular failure, failed diuretic+inotrope trial.  Due to low Bps, worsening dyspnea and edema, transferred to ICU and PCCM to assume care from [facility]. ? ?Patient doing okay as long as he sits up, dyspnea with minimal movement. ? ?Pertinent  Medical History  ?History of renal transplant ?Aflutter ?SLE ?CAD ?OSA ? ? ?Significant Hospital Events: ?Including procedures, antibiotic start and stop dates in addition to other pertinent events   ?5/1 admitted ?5/3 R/HC ?Roy Dyer 09/14/21 ?RA = 11 (no significant v-waves) ?RV = 77/15 ?PA = 79/35 (55) ?PCW = 24 ?Fick cardiac output/index = 6.96/3.61 ?Thermo = 6.92/3.59 ?PVR = 4.5 WU ?RA sat = 99% ?PA sat = 71%, 77%, 79% ?SVC sat = 86% ?5/6 worsening resp distress, to ICU, CRRT started ? ?Interim History / Subjective:  ?Got confused again last night. ?Now on heated high flow. ?Pulling 100/hr on CRRT. ?Family at bedside updated. ? ?Objective   ?Blood pressure 95/67, pulse 76, temperature (!) 96.6 ?F (35.9 ?C), temperature source Axillary, resp. rate (!) 22, height '5\' 7"'$  (1.702 m), weight 72.7 kg, SpO2 100 %. ?CVP:  [59 mmHg] 59 mmHg  ?Vent Mode: PCV;BIPAP ?FiO2 (%):  [60 %-100 %] 60 % ?Set Rate:  [15 bmp] 15 bmp ?Pressure Support:  [8 cmH20] 8 cmH20  ? ?Intake/Output Summary (Last 24 hours) at 09/18/2021 0818 ?Last data filed at 09/18/2021 0700 ?Gross per 24 hour  ?Intake 842.83 ml  ?Output 5223 ml  ?Net -4380.17 ml  ? ? ?Filed Weights  ? 09/16/21 0524 09/17/21 0600 09/18/21 0500  ?Weight: 83 kg 77.3 kg 72.7 kg  ? ? ?Examination: ?No distress ?Pleasantly confused ?Crackles at bases ?Minimal edema ?Central line still a bit oozy, heparin turned down  overnight ?Moves all 4 ext to command ? ?K erroneously high ?WBC/plts up a bit ? ? ?Resolved Hospital Problem list   ?N/A ? ?Assessment & Plan:  ?Acute on chronic biventricular failure- preserved output, exacerbated by high flow from AVF. 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 ?- CRRT with inotropes neg pull per CHF and nephro discussion ?- Titrate heated high flow O2 for sats > 90%, keep in ICU for CRRT and intubation watch ?- Continue midodrine ?- DC stress steroids, suspect this may be culprit for his delirium ?- Completed 5 days abx ?- Heparin gtt for now ?- Should we consider fistula ligation? Primary team to discuss ?- Family updated and bedside and reassured regarding delirium, had questions about ultimate destination; told them too early at present to tell ? ?Best Practice (right click and "Reselect all SmartList Selections" daily)  ? ?Diet/type: Regular consistency (see orders) ?DVT prophylaxis: heparin gtt ?GI prophylaxis: H2B ?Lines: Dialysis Catheter ?Foley:  N/A ?Code Status:  full code ?Last date of multidisciplinary goals of care discussion [Pending] ? ?33 min cc  time ?Roy Emery MD PCCM ? ? ?

## 2021-09-18 NOTE — Progress Notes (Signed)
Patient ID: Roy Dyer, male   DOB: 1965/07/01, 56 y.o.   MRN: [id number] ?[facility] ?Progress Note  ? ?Assessment/ Plan:   ?1. 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  Presented with acute kidney injury that was suspected to be from CHF decompensation however, continued to worsen and was refractory to diuretics.  He was transferred to the ICU for CRRT which was initiated on 5/6 ?- Continue CRRT  ?- UF goal of 100 to 200 currently - will adjust slightly to 100 to 150 ml/hr   ?- change to all 2K bath for now - K is 6.0  ?- will repeat K for accuracy ?- hopeful for renal recovery  ? ?2.  Acute exacerbation of congestive heart failure: Right heart catheterization with elevated biventricular pressures with high cardiac output and severe mixed pulmonary hypertension.  Started on CRRT for extracorporeal volume unloading after refractory to diuretics.  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 ?3. Acute hypoxic respiratory failure  ?- optimize volume status with CRRT ?- supportive care per pulm ? ?4. CKD stage III  ?- baseline Cr around 1.7  ? ?5.  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  ? ?6.  Immunosuppression   ?- Continue maintenance immunosuppressive therapy with Envarsus, mycophenolate.  He's normally on prednisone and note that this is currently off and he's on hydrocortisone IV.  ? ?7. Hypotension  ?- on midodrine   ?- optimize volume status  ? ?8. Atrial flutter ?- noted. Per primary team  ? ?9.  Hyponatremia: Secondary to CHF exacerbation and acute kidney injury, improving with volume unloading/CRRT. ? ?Subjective:   ?He had 5.1 liters UF  with CRRT over 5/7.  Had 300 mL uop over 5/7.  89/64 on CRRT.  Seen and examined on CRRT.  A little confused per nursing as below.  They have changed him to heated high flow oxygen and tried bipap overnight.  On dobutamine.  She's about to try bipap again ? ?Review of systems: ?He reports shortness of breath  ?No chest pain  ?Denies n/v ?Per nursing has had some confusion  ?  ? ?Objective:   ?BP 93/65   Pulse 81   Temp (!) 97.5 ?F (36.4 ?C) (Axillary)   Resp (!) 24   Ht '5\' 7"'$  (1.702 m)   Wt 72.7 kg   SpO2 100%   BMI 25.10 kg/m?  ? ?Intake/Output Summary (Last 24 hours) at 09/18/2021 0601 ?Last data filed at 09/18/2021 0600 ?Gross per 24 hour  ?Intake 1178.51 ml  ?Output 5413 ml  ?Net -4234.49 ml  ? ?Weight change: -4.6 kg ? ?Physical Exam:   ?Gen: Appears comfortable while sitting up in bed, awake/alert but gets dyspneic with conversation ?CVS: S1S2 do not appreciate Pericardial rub this am - neither does RN ?Resp: decreased breath sounds; on heated high flow; increased work of breathing with exertion or speech  ?Abd: Soft, flat, nontender ?Ext: Trace if any lower extremity edema ?Neuro-oriented to person, 2023 and at "cos[place] m[place]"  ?Psych no anxiety or agitation  ?Access RIJ nontunn HD catheter; bruit and thrill LUE AVF ? ? ?Imaging: ?DG CHEST PORT 1 VIEW ? ?Result Date: 09/16/2021 ?  CLINICAL DATA:  Shortness of breath. Respiratory failure. Central line placement. EXAM: PORTABLE CHEST 1 VIEW COMPARISON:  09/14/2021 FINDINGS: New right jugular dual-lumen central venous catheter is seen with tip overlying the superior cavoatrial junction. No evidence of pneumothorax. Stable cardiomegaly. Pacemaker remains in place. Worsening diffuse bilateral pulmonary airspace disease is seen, likely due to pulmonary edema. IMPRESSION: New right jugular dual-lumen central venous catheter in appropriate position. No evidence of pneumothorax. Worsening diffuse bilateral pulmonary airspace disease, likely due to pulmonary edema.  Electronically Signed   By: Marlaine Hind M.D.   On: 09/16/2021 14:09   ? ?Labs: ?BMET ?Recent Labs  ?Lab 09/12/21 ?0408 09/27/2021 ?6789 09/14/21 ?0308 09/15/21 ?3810 09/16/21 ?0345 09/16/21 ?1637 09/16/21 ?2139 09/17/21 ?0211 09/17/21 ?1643 09/17/21 ?1906 09/17/21 ?2113 09/18/21 ?1751  ?NA 131*   < > 133* 133* 131* 125* 134* 135 135 135 136 135  ?K 4.4   < > 4.1 4.6 4.1 4.6 4.3 4.9 4.7 4.5 4.5 6.0*  ?CL 95*   < > 99 98 94* 91*  --  100 100  --   --  101  ?CO2 23   < > 19* 23 22 19*  --  23 22  --   --  26  ?GLUCOSE 106*   < > 159* 120* 102* 311*  --  128* 178*  --   --  189*  ?BUN 43*   < > 49* 61* 70* 63*  --  47* 32*  --   --  29*  ?CREATININE 2.73*   < > 2.40* 3.48* 3.98* 3.50*  --  2.64* 2.00*  --   --  1.88*  ?CALCIUM 8.4*   < > 8.6* 8.5* 8.5* 8.1*  --  8.6* 8.8*  --   --  9.0  ?PHOS 5.0*  --  4.5  --  6.2* 5.6*  --  4.9* 3.3  --   --  2.9  ? < > = values in this interval not displayed.  ? ?CBC ?Recent Labs  ?Lab 10/03/2021 ?1617 09/29/2021 ?2224 09/14/21 ?0308 09/15/21 ?0258 09/16/21 ?0345 09/16/21 ?2139 09/17/21 ?0211 09/17/21 ?1906 09/17/21 ?2113 09/18/21 ?5277  ?WBC 17.3*   < > 21.2* 16.2* 11.3*  --  12.8*  --   --  17.3*  ?NEUTROABS 14.9*  --  18.0*  --   --   --   --   --   --   --   ?HGB 13.9   < > 13.2 12.2* 11.3*   < > 12.8* 15.0 15.0 13.3  ?HCT 47.9   < > 42.5 39.5 37.2*   < > 42.2 44.0 44.0 45.2  ?MCV 79.2*   < > 77.1* 78.8* 76.5*  --  76.7*  --   --  78.1*  ?PLT 408*   < > 354 299 300  --  351  --   --  448*  ? < > = values in this interval not displayed.  ? ? ?Medications:   ? ? amiodarone  100 mg Oral Daily  ? atorvastatin  80 mg Oral QHS  ? calcium acetate  667 mg Oral TID WC  ? chlorhexidine  15 mL Mouth Rinse BID  ? Chlorhexidine Gluconate Cloth  6 each Topical Daily  ? clopidogrel  75 mg Oral Daily  ? famotidine  20 mg Oral QHS  ? feeding supplement  237 mL Oral TID BM  ? fluticasone  2 spray Each Nare Daily  ? guaiFENesin  600 mg Oral BID  ?  hydrocortisone sod succinate (SOLU-CORTEF) inj  75 mg  Intravenous Q12H  ? mouth rinse  15 mL Mouth Rinse q12n4p  ? mexiletine  300 mg Oral BID  ? midodrine  5 mg Oral TID WC  ? multivitamin with minerals  1 tablet Oral Daily  ? mycophenolate  750 mg Oral BID  ? pantoprazole  40 mg Oral Daily  ? PARoxetine  20 mg Oral QPM  ? sodium chloride flush  3 mL Intravenous Q12H  ? sodium chloride flush  3 mL Intravenous Q12H  ? sodium chloride flush  3 mL Intravenous Q12H  ? tacrolimus ER  0.75 mg Oral Daily  ? Thrombi-Pad  1 each Topical Once  ? urea   Topical BID  ? ?Claudia Desanctis, MD ?09/18/2021, 6:29 AM ? ? ?

## 2021-09-18 NOTE — Progress Notes (Signed)
ANTICOAGULATION CONSULT NOTE ?Pharmacy Consult for heparin ?Indication: atrial fibrillation ?Brief A/P: aPTT supratherapeutic Decrease Heparin rate ?Allergies  ?Allergen Reactions  ? Triptans Palpitations  ?  Reaction to Maxalt  ? ? ?Patient Measurements: ?Height: '5\' 7"'$  (170.2 cm) ?Weight: 77.3 kg (170 lb 6.7 oz) ?IBW/kg (Calculated) : 66.1 ?Heparin Dosing Weight: 77kg ? ?Vital Signs: ?Temp: 97.5 ?F (36.4 ?C) (05/08 0400) ?Temp Source: Axillary (05/08 0400) ?BP: 94/70 (05/08 0400) ?Pulse Rate: 81 (05/08 0400) ? ?Labs: ?Recent Labs  ?  09/16/21 ?0345 09/16/21 ?1637 09/17/21 ?0211 09/17/21 ?1643 09/17/21 ?1906 09/17/21 ?2113 09/18/21 ?0786  ?HGB 11.3*   < > 12.8*  --  15.0 15.0 13.3  ?HCT 37.2*   < > 42.2  --  44.0 44.0 45.2  ?PLT 300  --  351  --   --   --  448*  ?APTT  --   --  42*  --   --   --  108*  ?HEPARINUNFRC  --   --   --   --   --   --  >1.10*  ?CREATININE 3.98*   < > 2.64* 2.00*  --   --  1.88*  ? < > = values in this interval not displayed.  ? ? ? ?Estimated Creatinine Clearance: 42 mL/min (A) (by C-G formula based on SCr of 1.88 mg/dL (H)). ? ?Assessment: ?56 y.o. male with h/o Afib, Eliquis on hold, for heparin ? ?Goal of Therapy:  ?Heparin level 0.3-0.7 units/ml ?aPTT 66-102 seconds ?Monitor platelets by anticoagulation protocol: Yes ?  ?Plan:  ?Decrease Heparin 900 units/hr ? ?Phillis Knack, PharmD, BCPS  ?09/18/2021 5:22 AM ? ? ?

## 2021-09-18 NOTE — Progress Notes (Addendum)
? ? Advanced Heart Failure Rounding Note ? ?PCP-Cardiologist: [name], MD  ? ?Subjective:   ? ?Echo: EF 30-35% (stable), severe BAE, RV mod reduced, mod TR, Mod Pericardial Effusion w/o tamponade, IVC dilated, RVSP severely elevated 75 mmHg  ? ?[place] 09/14/21 ?RA = 11 (no significant v-waves) ?RV = 77/15 ?PA = 79/35 (55) ?PCW = 24 ?Fick cardiac output/index = 6.96/3.61 ?Thermo = 6.92/3.59 ?PVR = 4.5 WU ?FA sat = 99% ?PA sat = 71%, 77%, 79% ?SVC sat = 86% ?  ?Transferred to ICU 05/06 and started CRRT ? ?5.1L volume removed yesterday. Weight down another 10 lb. Currently pulling for UF goal 100-150/hr.  ? ?On HF Wal[place], FiO2 70% ? ?Continues on DBA 3.   ? ?Reports feeling okay. Appears confused. Family reports he has been hallucinating. ? ? ?Objective:   ?Weight Range: ?72.7 kg ?Body mass index is 25.1 kg/m?.  ? ?Vital Signs:   ?Temp:  [96.6 ?F (35.9 ?C)-98.4 ?F (36.9 ?C)] 96.6 ?F (35.9 ?C) (05/08 7858) ?Pulse Rate:  [75-101] 76 (05/08 0700) ?Resp:  [17-30] 22 (05/08 0700) ?BP: (89-127)/(63-84) 95/67 (05/08 0700) ?SpO2:  [89 %-100 %] 100 % (05/08 0700) ?FiO2 (%):  [60 %-100 %] 60 % (05/08 0625) ?Weight:  [72.7 kg] 72.7 kg (05/08 0500) ?Last BM Date : 09/18/2021 ? ?Weight change: ?Filed Weights  ? 09/16/21 0524 09/17/21 0600 09/18/21 0500  ?Weight: 83 kg 77.3 kg 72.7 kg  ? ? ?Intake/Output:  ? ?Intake/Output Summary (Last 24 hours) at 09/18/2021 0756 ?Last data filed at 09/18/2021 0700 ?Gross per 24 hour  ?Intake 846.55 ml  ?Output 5426 ml  ?Net -4579.45 ml  ?  ? ? ?Physical Exam  ? ?General:  Sitting up in bed. Appears weak. ?HEENT: normal ?Neck: supple. JVP 8-10 R IJ HD with a little oozing. cath Carotids 2+ bilat; no bruits.  ?Cor: PMI nondisplaced. Regular rate & rhythm. No rubs, gallops or murmurs. ?Lungs: coarse ?Abdomen: soft, nontender, nondistended. ?Extremities: no cyanosis, clubbing, rash, 1-2+ edema, LUE AV fistula ?Neuro: alert & orientedx3, cranial nerves grossly intact. moves all 4 extremities w/o difficulty.  Appears confused. ? ? ? ?Telemetry  ?  ?SR 70s-80s (personally reviewed) ? ?Labs  ?  ?CBC ?Recent Labs  ?  09/17/21 ?0211 09/17/21 ?1906 09/17/21 ?2113 09/18/21 ?0418  ?WBC 12.8*  --   --  17.3*  ?HGB 12.8*   < > 15.0 13.3  ?HCT 42.2   < > 44.0 45.2  ?MCV 76.7*  --   --  78.1*  ?PLT 351  --   --  448*  ? < > = values in this interval not displayed.  ? ?Basic Metabolic Panel ?Recent Labs  ?  09/17/21 ?0211 09/17/21 ?1643 09/17/21 ?1906 09/17/21 ?2113 09/18/21 ?8502 09/18/21 ?7741  ?NA 135 135   < > 136 135  --   ?K 4.9 4.7   < > 4.5 6.0* 5.1  ?CL 100 100  --   --  101  --   ?CO2 23 22  --   --  26  --   ?GLUCOSE 128* 178*  --   --  189*  --   ?BUN 47* 32*  --   --  29*  --   ?CREATININE 2.64* 2.00*  --   --  1.88*  --   ?CALCIUM 8.6* 8.8*  --   --  9.0  --   ?MG 2.4  --   --   --  2.8*  --   ?PHOS  4.9* 3.3  --   --  2.9  --   ? < > = values in this interval not displayed.  ? ?Liver Function Tests ?Recent Labs  ?  09/17/21 ?1643 09/18/21 ?0418  ?ALBUMIN 2.3* 2.3*  ? ?No results for input(s): LIPASE, AMYLASE in the last 72 hours. ?Cardiac Enzymes ?No results for input(s): CKTOTAL, CKMB, CKMBINDEX, TROPONINI in the last 72 hours. ? ? ?BNP: ?BNP (last 3 results) ?Recent Labs  ?  05/02/21 ?1234 10/10/2021 ?1617 09/14/21 ?0308  ?BNP 1,152.1* 1,553.9* 1,395.1*  ? ? ?ProBNP (last 3 results) ?No results for input(s): PROBNP in the last 8760 hours. ? ? ?D-Dimer ?No results for input(s): DDIMER in the last 72 hours. ?Hemoglobin A1C ?No results for input(s): HGBA1C in the last 72 hours. ?Fasting Lipid Panel ?No results for input(s): CHOL, HDL, LDLCALC, TRIG, CHOLHDL, LDLDIRECT in the last 72 hours. ?Thyroid Function Tests ?No results for input(s): TSH, T4TOTAL, T3FREE, THYROIDAB in the last 72 hours. ? ?Invalid input(s): FREET3 ? ? ?Other results: ? ? ?Imaging  ? ? ?No results found. ? ? ?Medications:   ? ? ?Scheduled Medications: ? amiodarone  100 mg Oral Daily  ? atorvastatin  80 mg Oral QHS  ? calcium acetate  667 mg Oral TID  WC  ? chlorhexidine  15 mL Mouth Rinse BID  ? Chlorhexidine Gluconate Cloth  6 each Topical Daily  ? clopidogrel  75 mg Oral Daily  ? famotidine  20 mg Oral QHS  ? feeding supplement  237 mL Oral TID BM  ? fluticasone  2 spray Each Nare Daily  ? guaiFENesin  600 mg Oral BID  ? hydrocortisone sod succinate (SOLU-CORTEF) inj  75 mg Intravenous Q12H  ? mouth rinse  15 mL Mouth Rinse q12n4p  ? mexiletine  300 mg Oral BID  ? midodrine  5 mg Oral TID WC  ? multivitamin with minerals  1 tablet Oral Daily  ? mycophenolate  750 mg Oral BID  ? pantoprazole  40 mg Oral Daily  ? PARoxetine  20 mg Oral QPM  ? sodium chloride flush  3 mL Intravenous Q12H  ? sodium chloride flush  3 mL Intravenous Q12H  ? sodium chloride flush  3 mL Intravenous Q12H  ? tacrolimus ER  0.75 mg Oral Daily  ? Thrombi-Pad  1 each Topical Once  ? urea   Topical BID  ? ? ?Infusions: ? sodium chloride    ? sodium chloride 7.5 mL/hr at 09/16/21 1700  ? DOBUTamine 3 mcg/kg/min (09/18/21 0700)  ? heparin 10,000 units/ 20 mL infusion syringe 500 Units/hr (09/18/21 0030)  ? heparin 900 Units/hr (09/18/21 0700)  ? norepinephrine (LEVOPHED) Adult infusion    ? prismasol BGK 2/2.5 dialysis solution 1,500 mL/hr at 09/18/21 0645  ? prismasol BGK 2/2.5 replacement solution 400 mL/hr at 09/18/21 0646  ? prismasol BGK 2/2.5 replacement solution 400 mL/hr at 09/18/21 0646  ? ? ?PRN Medications: ?sodium chloride, sodium chloride, acetaminophen, albuterol, heparin, heparin, HYDROcodone-acetaminophen, melatonin, ondansetron (ZOFRAN) IV, ondansetron **OR** [DISCONTINUED] ondansetron (ZOFRAN) IV, polyethylene glycol, sodium chloride, sodium chloride flush, sodium chloride flush ? ? ? ?Patient Profile  ? ?56 y/o male w/ chronic systolic heart failure due to ICM, CAD, h/o pericarditis, Stage IIIa CKD s/p renal transplant in 2012 due to SLE, PAF/AFL and frequent PVCs admitted for a/c CHF.  ?  ? ?Assessment/Plan  ? ?Acute on Chronic Biventricular Heart Failure ?- HX ICD in  2012 ?- Echo 12/17 EF 30%  ?- Cath 04/22/20  for worsening HF symptoms. Stable CAD. ?- Echo 6/22 EF 30-35%, Grade III DD, severe LAE, mild/mod TR ?- Echo 02/04/21: EF 30-35% No effusion, RV normal. ?- Echo 3/23: EF 35%, RV mod reduced, severe TR ?- Admitted w/ a/c CHF w/  worsening NYHA Functional Class IIIb and fluid overload w/ AKI on CKD  ?- Echo EF 30-35% (stable), severe BAE, RV mod reduced, mod TR, Mod Pericardial Effusion w/o tamponade, IVC dilated, RVSP severely elevated 75 mmHg  ?- RHC w/ elevated biventricular filling pressures w/ high CO in setting of known peripheral shunt (AVF) + severe mixed pulmonary HTN, RA 11, PCWP 24 ?- No benefit from milrinone or DBA. Based on RHC low output does not seem to be the issue ?- Renal function and volume status worsened. Moved to ICU on 5/6 for CVVHD  ?- Weight down 20 lb last 2 days. Set up CVP monitoring. Slow volume removal with CVVHD if CVP < 10. Reduce DBA to 1. ?- Worry may end up needing iHD ? ?2. Pericardial Effusion ?- moderate on echo. No tamponade. BP stable  ?- Suspect 2/2 pericarditis + CHF  ?- Likely related to renal failure/volume overload. Should improve with HD ?- Will repeat limited echo prio to d/c ?  ?3. Mod- Severe Tricuspid Regurgitation  ?- Recent progression, Echo 9/22 TR moderate ?- Echo 3/23 TR Severe  ?- Mod on Echo this admit  ?- functional in setting of dilated RV ?- no significant v-waves on RHC  ?- HF optimization per above ?  ?4. Pulmonary HTN  ?- Echo 3/23 w/ severely elevated RVSP, 68 mmHg and severe TR.  ?- Echo this admit RVSP 75  ?- Combination WHO Groups 2+3.  ?- Given H/o SLE ? WHO Group 1 component  ?- RHC w/ severe mixed pulmonary HTN, PVR = 4.5 WU ?- V/Q scan 9/22 Negative for PE  ?- on CPAP for OSA. Reports intermittent compliance  ?- Will improve some with volume removal ?  ?5. AKI no CKD Stage IIIa S/p  Renal transplant in 2012 (due to SLE): ?- Follows with nephrology, Dr. Clover Mealy and Dr. Lissa Merlin at [facility]. ?- Baseline  Creatinine 1.4-1.7 ?- Recent Scr 2.0 at [facility] 04/13/21 ?- SCr 2.73 on admit>>2.4>>3.5\ -> 4.0   Follow closely w/ diuresis  ?- Continue steroids, Prograf and CellCept.  ?- Tacrolimus level 12.9  ?- CVVHD started 5/6.

## 2021-09-19 ENCOUNTER — Other Ambulatory Visit: Payer: Self-pay | Admitting: Family Medicine

## 2021-09-19 ENCOUNTER — Inpatient Hospital Stay (HOSPITAL_COMMUNITY): Payer: Managed Care, Other (non HMO)

## 2021-09-19 DIAGNOSIS — I5043 Acute on chronic combined systolic (congestive) and diastolic (congestive) heart failure: Secondary | ICD-10-CM | POA: Diagnosis not present

## 2021-09-19 DIAGNOSIS — N179 Acute kidney failure, unspecified: Secondary | ICD-10-CM | POA: Diagnosis not present

## 2021-09-19 DIAGNOSIS — J189 Pneumonia, unspecified organism: Secondary | ICD-10-CM | POA: Diagnosis not present

## 2021-09-19 DIAGNOSIS — I3139 Other pericardial effusion (noninflammatory): Secondary | ICD-10-CM

## 2021-09-19 DIAGNOSIS — N189 Chronic kidney disease, unspecified: Secondary | ICD-10-CM | POA: Diagnosis not present

## 2021-09-19 LAB — CBC
HCT: 45.1 % (ref 39.0–52.0)
Hemoglobin: 13.2 g/dL (ref 13.0–17.0)
MCH: 23 pg — ABNORMAL LOW (ref 26.0–34.0)
MCHC: 29.3 g/dL — ABNORMAL LOW (ref 30.0–36.0)
MCV: 78.7 fL — ABNORMAL LOW (ref 80.0–100.0)
Platelets: 402 10*3/uL — ABNORMAL HIGH (ref 150–400)
RBC: 5.73 MIL/uL (ref 4.22–5.81)
RDW: 19.4 % — ABNORMAL HIGH (ref 11.5–15.5)
WBC: 16.6 10*3/uL — ABNORMAL HIGH (ref 4.0–10.5)
nRBC: 0 % (ref 0.0–0.2)

## 2021-09-19 LAB — POCT I-STAT 7, (LYTES, BLD GAS, ICA,H+H)
Acid-base deficit: 5 mmol/L — ABNORMAL HIGH (ref 0.0–2.0)
Bicarbonate: 21 mmol/L (ref 20.0–28.0)
Calcium, Ion: 1.36 mmol/L (ref 1.15–1.40)
HCT: 41 % (ref 39.0–52.0)
Hemoglobin: 13.9 g/dL (ref 13.0–17.0)
O2 Saturation: 99 %
Patient temperature: 97.9
Potassium: 4.7 mmol/L (ref 3.5–5.1)
Sodium: 134 mmol/L — ABNORMAL LOW (ref 135–145)
TCO2: 22 mmol/L (ref 22–32)
pCO2 arterial: 40.4 mmHg (ref 32–48)
pH, Arterial: 7.321 — ABNORMAL LOW (ref 7.35–7.45)
pO2, Arterial: 178 mmHg — ABNORMAL HIGH (ref 83–108)

## 2021-09-19 LAB — COOXEMETRY PANEL
Carboxyhemoglobin: 1.5 % (ref 0.5–1.5)
Methemoglobin: <0.7 % (ref 0.0–1.5)
O2 Saturation: 77.8 %
Total hemoglobin: 11.6 g/dL — ABNORMAL LOW (ref 12.0–16.0)

## 2021-09-19 LAB — RENAL FUNCTION PANEL
Albumin: 2.4 g/dL — ABNORMAL LOW (ref 3.5–5.0)
Anion gap: 9 (ref 5–15)
BUN: 18 mg/dL (ref 6–20)
CO2: 27 mmol/L (ref 22–32)
Calcium: 8.9 mg/dL (ref 8.9–10.3)
Chloride: 100 mmol/L (ref 98–111)
Creatinine, Ser: 1.7 mg/dL — ABNORMAL HIGH (ref 0.61–1.24)
GFR, Estimated: 47 mL/min — ABNORMAL LOW (ref >60)
Glucose, Bld: 106 mg/dL — ABNORMAL HIGH (ref 70–99)
Phosphorus: 2.4 mg/dL — ABNORMAL LOW (ref 2.5–4.6)
Potassium: 4.1 mmol/L (ref 3.5–5.1)
Sodium: 136 mmol/L (ref 135–145)

## 2021-09-19 LAB — RESPIRATORY PANEL BY PCR

## 2021-09-19 LAB — RESP PANEL BY RT-PCR (FLU A&B, COVID) ARPGX2
Influenza A by PCR: NEGATIVE
Influenza B by PCR: NEGATIVE
SARS Coronavirus 2 by RT PCR: NEGATIVE

## 2021-09-19 LAB — ECHOCARDIOGRAM LIMITED
Height: 67 in
S' Lateral: 6.1 cm
Weight: 2592.61 oz

## 2021-09-19 LAB — EXPECTORATED SPUTUM ASSESSMENT W GRAM STAIN, RFLX TO RESP C

## 2021-09-19 LAB — SEDIMENTATION RATE: Sed Rate: 27 mm/hr — ABNORMAL HIGH (ref 0–16)

## 2021-09-19 LAB — HEPARIN LEVEL (UNFRACTIONATED): Heparin Unfractionated: >1.1 IU/mL — ABNORMAL HIGH (ref 0.30–0.70)

## 2021-09-19 LAB — TACROLIMUS LEVEL: Tacrolimus (FK506) - LabCorp: 13.7 ng/mL (ref 2.0–20.0)

## 2021-09-19 LAB — APTT: aPTT: 90 seconds — ABNORMAL HIGH (ref 24–36)

## 2021-09-19 LAB — MAGNESIUM: Magnesium: 2.8 mg/dL — ABNORMAL HIGH (ref 1.7–2.4)

## 2021-09-19 LAB — LACTATE DEHYDROGENASE: LDH: 402 U/L — ABNORMAL HIGH (ref 98–192)

## 2021-09-19 LAB — C-REACTIVE PROTEIN: CRP: 33.3 mg/dL — ABNORMAL HIGH (ref <1.0)

## 2021-09-19 MED ORDER — SODIUM CHLORIDE 0.9 % IV SOLN
1.0000 g | Freq: Three times a day (TID) | INTRAVENOUS | Status: DC
  Filled 2021-09-19: qty 20

## 2021-09-19 MED ORDER — SODIUM CHLORIDE 0.9 % IV SOLN
1.0000 g | Freq: Two times a day (BID) | INTRAVENOUS | Status: DC
  Administered 2021-09-19 – 2021-09-21 (×4): 1 g via INTRAVENOUS
  Filled 2021-09-19 (×4): qty 20

## 2021-09-19 MED ORDER — PRISMASOL BGK 4/2.5 32-4-2.5 MEQ/L REPLACEMENT SOLN: Status: DC

## 2021-09-19 MED ORDER — VANCOMYCIN HCL 750 MG/150ML IV SOLN
750.0000 mg | INTRAVENOUS | Status: DC
Start: 2021-09-20 — End: 2021-09-19

## 2021-09-19 MED ORDER — SODIUM PHOSPHATES 45 MMOLE/15ML IV SOLN
15.0000 mmol | Freq: Once | INTRAVENOUS | Status: AC
  Administered 2021-09-19: 15 mmol via INTRAVENOUS
  Filled 2021-09-19: qty 5

## 2021-09-19 MED ORDER — DEXMEDETOMIDINE HCL IN NACL 400 MCG/100ML IV SOLN
0.4000 ug/kg/h | INTRAVENOUS | Status: DC
  Administered 2021-09-20: 0.4 ug/kg/h via INTRAVENOUS
  Administered 2021-09-21: 0.8 ug/kg/h via INTRAVENOUS
  Administered 2021-09-21: 0.4 ug/kg/h via INTRAVENOUS
  Administered 2021-09-22 (×2): 0.8 ug/kg/h via INTRAVENOUS
  Filled 2021-09-19 (×5): qty 100

## 2021-09-19 MED ORDER — VANCOMYCIN VARIABLE DOSE PER UNSTABLE RENAL FUNCTION (PHARMACIST DOSING): Status: DC

## 2021-09-19 MED ORDER — SODIUM CHLORIDE 0.9 % IV SOLN
500.0000 mg | INTRAVENOUS | Status: DC
  Administered 2021-09-19 – 2021-09-22 (×4): 500 mg via INTRAVENOUS
  Filled 2021-09-19 (×4): qty 5

## 2021-09-19 MED ORDER — VANCOMYCIN HCL 1500 MG/300ML IV SOLN
1500.0000 mg | INTRAVENOUS | Status: AC
  Administered 2021-09-19: 1500 mg via INTRAVENOUS
  Filled 2021-09-19: qty 300

## 2021-09-19 MED ORDER — METHYLPREDNISOLONE SODIUM SUCC 125 MG IJ SOLR
80.0000 mg | INTRAMUSCULAR | Status: DC
  Administered 2021-09-19: 80 mg via INTRAVENOUS
  Filled 2021-09-19: qty 2

## 2021-09-19 MED ORDER — LIDOCAINE HCL (PF) 1 % IJ SOLN
5.0000 mL | Freq: Once | INTRAMUSCULAR | Status: AC
  Administered 2021-09-19: 5 mL via INTRADERMAL

## 2021-09-19 MED ORDER — PRISMASOL BGK 4/2.5 32-4-2.5 MEQ/L EC SOLN: Status: DC

## 2021-09-19 MED ORDER — LIDOCAINE HCL 1 % IJ SOLN: 10.0000 mL | Freq: Once | INTRAMUSCULAR | Status: DC

## 2021-09-19 MED ORDER — SODIUM CHLORIDE 3 % IN NEBU
4.0000 mL | INHALATION_SOLUTION | Freq: Once | RESPIRATORY_TRACT | Status: AC
  Administered 2021-09-19: 4 mL via RESPIRATORY_TRACT
  Filled 2021-09-19: qty 15

## 2021-09-19 MED ORDER — LIDOCAINE HCL (PF) 1 % IJ SOLN
INTRAMUSCULAR | Status: AC
  Filled 2021-09-19: qty 5

## 2021-09-19 NOTE — TOC CM/SW Note (Signed)
HF TOC CM spoke to pt and family at bedside. Pt states wife is working getting his paperwork from his job for [employer], disability. Provided sister with my contact number. Will give paperwork to attending to complete for his FMLA or disability.Roy Dyer RN3 CCM, Heart Failure TOC CM [phone]  ?

## 2021-09-19 NOTE — Progress Notes (Addendum)
? ? Advanced Heart Failure Rounding Note ? ?PCP-Cardiologist: [name], MD  ? ?Subjective:   ? ?Echo: EF 30-35% (stable), severe BAE, RV mod reduced, mod TR, Mod Pericardial Effusion w/o tamponade, IVC dilated, RVSP severely elevated 75 mmHg  ? ?[place] 09/14/21 ?RA = 11 (no significant v-waves) ?RV = 77/15 ?PA = 79/35 (55) ?PCW = 24 ?Fick cardiac output/index = 6.96/3.61 ?Thermo = 6.92/3.59 ?PVR = 4.5 WU ?FA sat = 99% ?PA sat = 71%, 77%, 79% ?SVC sat = 86% ?  ?Transferred to ICU 05/06 and started CRRT ? ?Continues on CRRT. 800 cc volume removal yesterday. Pulling for net even. Up 2 lb, but down total of 20 lb since 05/06.  ? ?DBA down to 1 mcg/kg/min ? ?On HF [place], FiO2 60% ? ?MAPs stable in 70s-low 80s ? ?Some oozing from HD cath site. Heparin stopped in circuit, now on just systemic heparin ? ?CVP 2. Denies worsening dyspnea but appears visibly short of breath when attempting to lie flat. ? ?Less confused today.  ? ? ?Objective:   ?Weight Range: ?73.5 kg ?Body mass index is 25.38 kg/m?.  ? ?Vital Signs:   ?Temp:  [97.6 ?F (36.4 ?C)-97.7 ?F (36.5 ?C)] 97.6 ?F (36.4 ?C) (05/09 0400) ?Pulse Rate:  [72-90] 82 (05/09 0730) ?Resp:  [16-32] 20 (05/09 0730) ?BP: (91-133)/(59-85) 108/63 (05/09 0730) ?SpO2:  [85 %-100 %] 95 % (05/09 0730) ?FiO2 (%):  [60 %-70 %] 60 % (05/09 0710) ?Weight:  [73.5 kg] 73.5 kg (05/09 0557) ?Last BM Date : 09/25/2021 ? ?Weight change: ?Filed Weights  ? 09/17/21 0600 09/18/21 0500 09/19/21 0557  ?Weight: 77.3 kg 72.7 kg 73.5 kg  ? ? ?Intake/Output:  ? ?Intake/Output Summary (Last 24 hours) at 09/19/2021 0809 ?Last data filed at 09/19/2021 0700 ?Gross per 24 hour  ?Intake 606.76 ml  ?Output 718 ml  ?Net -111.24 ml  ?  ? ? ?Physical Exam  ? ?General:  Chronically ill appearing. ?HEENT: normal ?Neck: supple. R IJ HD cath. Carotids 2+ bilat; no bruits.  ?Cor: PMI nondisplaced. Regular rate & rhythm. No rubs, gallops or murmurs. ?Lungs: bibasilar crackles, O2 stable on HFNC ?Abdomen: soft, nontender,  nondistended. No hepatosplenomegaly.  ?Extremities: no cyanosis, clubbing, rash, edema ?Neuro: alert & orientedx3, cranial nerves grossly intact. moves all 4 extremities w/o difficulty. Affect pleasant ? ? ? ? ?Telemetry  ?  ?SR 70s-80s (personally reviewed) ? ?Labs  ?  ?CBC ?Recent Labs  ?  09/18/21 ?0418 09/19/21 ?0441  ?WBC 17.3* 16.6*  ?HGB 13.3 13.2  ?HCT 45.2 45.1  ?MCV 78.1* 78.7*  ?PLT 448* 402*  ? ?Basic Metabolic Panel ?Recent Labs  ?  09/18/21 ?0418 09/18/21 ?0641 09/18/21 ?1537 09/19/21 ?0441  ?NA 135  --  138 136  ?K 6.0*   < > 4.6 4.1  ?CL 101  --  101 100  ?CO2 26  --  27 27  ?GLUCOSE 189*  --  132* 106*  ?BUN 29*  --  24* 18  ?CREATININE 1.88*  --  1.83* 1.70*  ?CALCIUM 9.0  --  9.1 8.9  ?MG 2.8*  --   --  2.8*  ?PHOS 2.9  --  2.4* 2.4*  ? < > = values in this interval not displayed.  ? ?Liver Function Tests ?Recent Labs  ?  09/18/21 ?1537 09/19/21 ?0441  ?ALBUMIN 2.5* 2.4*  ? ?No results for input(s): LIPASE, AMYLASE in the last 72 hours. ?Cardiac Enzymes ?No results for input(s): CKTOTAL, CKMB, CKMBINDEX, TROPONINI in the last  72 hours. ? ? ?BNP: ?BNP (last 3 results) ?Recent Labs  ?  05/02/21 ?1234 09/24/2021 ?1617 09/14/21 ?0308  ?BNP 1,152.1* 1,553.9* 1,395.1*  ? ? ?ProBNP (last 3 results) ?No results for input(s): PROBNP in the last 8760 hours. ? ? ?D-Dimer ?No results for input(s): DDIMER in the last 72 hours. ?Hemoglobin A1C ?No results for input(s): HGBA1C in the last 72 hours. ?Fasting Lipid Panel ?No results for input(s): CHOL, HDL, LDLCALC, TRIG, CHOLHDL, LDLDIRECT in the last 72 hours. ?Thyroid Function Tests ?No results for input(s): TSH, T4TOTAL, T3FREE, THYROIDAB in the last 72 hours. ? ?Invalid input(s): FREET3 ? ? ?Other results: ? ? ?Imaging  ? ? ?No results found. ? ? ?Medications:   ? ? ?Scheduled Medications: ? amiodarone  100 mg Oral Daily  ? atorvastatin  80 mg Oral QHS  ? chlorhexidine  15 mL Mouth Rinse BID  ? Chlorhexidine Gluconate Cloth  6 each Topical Daily  ? clonazepam   0.5 mg Oral QHS  ? clopidogrel  75 mg Oral Daily  ? famotidine  20 mg Oral QHS  ? feeding supplement  237 mL Oral TID BM  ? fluticasone  2 spray Each Nare Daily  ? guaiFENesin  600 mg Oral BID  ? mouth rinse  15 mL Mouth Rinse q12n4p  ? mexiletine  300 mg Oral BID  ? midodrine  5 mg Oral TID WC  ? multivitamin with minerals  1 tablet Oral Daily  ? mycophenolate  750 mg Oral BID  ? pantoprazole  40 mg Oral Daily  ? PARoxetine  20 mg Oral QPM  ? predniSONE  5 mg Oral Q breakfast  ? sodium chloride flush  3 mL Intravenous Q12H  ? sodium chloride flush  3 mL Intravenous Q12H  ? sodium chloride flush  3 mL Intravenous Q12H  ? tacrolimus ER  0.75 mg Oral Daily  ? Thrombi-Pad  1 each Topical Once  ? urea   Topical BID  ? ? ?Infusions: ?  prismasol BGK 4/2.5 400 mL/hr at 09/19/21 5176  ?  prismasol BGK 4/2.5 400 mL/hr at 09/19/21 1607  ? sodium chloride    ? sodium chloride 7.5 mL/hr at 09/16/21 1700  ? DOBUTamine 1 mcg/kg/min (09/19/21 0700)  ? heparin 700 Units/hr (09/19/21 0700)  ? norepinephrine (LEVOPHED) Adult infusion    ? prismasol BGK 4/2.5 1,500 mL/hr at 09/19/21 3710  ? sodium phosphate  Dextrose 5% IVPB 43 mL/hr at 09/19/21 0700  ? ? ?PRN Medications: ?sodium chloride, sodium chloride, acetaminophen, albuterol, baclofen, heparin, heparin, HYDROcodone-acetaminophen, melatonin, ondansetron (ZOFRAN) IV, ondansetron **OR** [DISCONTINUED] ondansetron (ZOFRAN) IV, polyethylene glycol, sodium chloride, sodium chloride flush, sodium chloride flush ? ? ? ?Patient Profile  ? ?56 y/o male w/ chronic systolic heart failure due to ICM, CAD, h/o pericarditis, Stage IIIa CKD s/p renal transplant in 2012 due to SLE, PAF/AFL and frequent PVCs admitted for a/c CHF.  ?  ? ?Assessment/Plan  ? ?Acute on Chronic Biventricular Heart Failure ?- HX ICD in 2012 ?- Echo 12/17 EF 30%  ?- Cath 04/22/20 for worsening HF symptoms. Stable CAD. ?- Echo 6/22 EF 30-35%, Grade III DD, severe LAE, mild/mod TR ?- Echo 02/04/21: EF 30-35% No effusion,  RV normal. ?- Echo 3/23: EF 35%, RV mod reduced, severe TR ?- Admitted w/ a/c CHF w/  worsening NYHA Functional Class IIIb and fluid overload w/ AKI on CKD  ?- Echo EF 30-35% (stable), severe BAE, RV mod reduced, mod TR, Mod Pericardial Effusion w/o tamponade, IVC  dilated, RVSP severely elevated 75 mmHg  ?- RHC w/ elevated biventricular filling pressures w/ high CO in setting of known peripheral shunt (AVF) + severe mixed pulmonary HTN, RA 11, PCWP 24 ?- No benefit from milrinone or DBA. Based on RHC low output does not seem to be the issue ?- Renal function and volume status worsened. Moved to ICU on 5/6 for CVVHD  ?- Weight down total of 20 lb. Now pulling for net even. CVP only 2 but still with high O2 requirements. Check CO-OX. Lung opacities improving on CXR yesterday. ?-? Role of chest CT to further evaluate ?- Repeat limited echo ?- Worry may end up needing iHD ? ?2. Pericardial Effusion ?- moderate on echo. No tamponade. BP stable  ?- Suspect 2/2 pericarditis + CHF  ?- Likely related to renal failure/volume overload. Should improve with HD ?- Will repeat limited echo ?  ?3. Mod- Severe Tricuspid Regurgitation  ?- Recent progression, Echo 9/22 TR moderate ?- Echo 3/23 TR Severe  ?- Mod on Echo this admit  ?- functional in setting of dilated RV ?- no significant v-waves on RHC  ?- HF optimization per above ?  ?4. Pulmonary HTN  ?- Echo 3/23 w/ severely elevated RVSP, 68 mmHg and severe TR.  ?- Echo this admit RVSP 75  ?- Combination WHO Groups 2+3.  ?- Given H/o SLE ? WHO Group 1 component  ?- RHC w/ severe mixed pulmonary HTN, PVR = 4.5 WU ?- V/Q scan 9/22 Negative for PE  ?- on CPAP for OSA. Reports intermittent compliance  ?- Will improve some with volume removal ?  ?5. AKI no CKD Stage IIIa S/p  Renal transplant in 2012 (due to SLE): ?- Follows with nephrology, Dr. Clover Mealy and Dr. Lissa Merlin at [facility]. ?- Baseline Creatinine 1.4-1.7 ?- Recent Scr 2.0 at [facility] 04/13/21 ?- SCr 2.73 on admit>>2.4>>3.5\ -> 4.0    Follow closely w/ diuresis  ?- Continue steroids, Prograf and CellCept.  ?- Tacrolimus level 12.9  ?- CVVHD started 5/6. Down 20 lb. CVP only 2. Not sure how much O2 requirements d/t volume overload vs ? Possi

## 2021-09-19 NOTE — Progress Notes (Signed)
?  Echocardiogram ?2D Echocardiogram has been performed. ? ?Joette Catching ?09/19/2021, 12:05 PM ?

## 2021-09-19 NOTE — Progress Notes (Signed)
? ?  NAME:  Roy Dyer, MRN:  [id number], DOB:  10-10-1965, LOS: 8 ?ADMISSION DATE:  10/10/2021, CONSULTATION DATE:  09/16/21 ?REFERRING MD:  TRH, CHIEF COMPLAINT:  SOB  ? ?History of Present Illness:  ?56 year old man with hx of ESRD s/p renal transplant now with CKD, CAD, Afib who presented with insidious onset SOB.  Found to be in biventricular failure, failed diuretic+inotrope trial.  Due to low Bps, worsening dyspnea and edema, transferred to ICU and PCCM to assume care from [facility]. ? ?Patient doing okay as long as he sits up, dyspnea with minimal movement. ? ?Pertinent  Medical History  ?History of renal transplant ?Aflutter ?SLE ?CAD ?OSA ? ? ?Significant Hospital Events: ?Including procedures, antibiotic start and stop dates in addition to other pertinent events   ?5/1 admitted ?5/3 R/HC ?[place] 09/14/21 ?RA = 11 (no significant v-waves) ?RV = 77/15 ?PA = 79/35 (55) ?PCW = 24 ?Fick cardiac output/index = 6.96/3.61 ?Thermo = 6.92/3.59 ?PVR = 4.5 WU ?RA sat = 99% ?PA sat = 71%, 77%, 79% ?SVC sat = 86% ?5/6 worsening resp distress, to ICU, CRRT started ? ?Interim History / Subjective:  ?More clear headed this AM. ?CVP now near 0. ?CRRT to stop. ?Still with significant O2 need. ? ?Objective   ?Blood pressure 105/70, pulse 89, temperature 97.6 ?F (36.4 ?C), temperature source Oral, resp. rate (!) 22, height '5\' 7"'$  (1.702 m), weight 73.5 kg, SpO2 96 %. ?CVP:  [2 mmHg-11 mmHg] 2 mmHg  ?FiO2 (%):  [60 %-70 %] 60 %  ? ?Intake/Output Summary (Last 24 hours) at 09/19/2021 1005 ?Last data filed at 09/19/2021 1000 ?Gross per 24 hour  ?Intake 965.35 ml  ?Output 669 ml  ?Net 296.35 ml  ? ? ?Filed Weights  ? 09/17/21 0600 09/18/21 0500 09/19/21 0557  ?Weight: 77.3 kg 72.7 kg 73.5 kg  ? ? ?Examination: ?No distress ?Pleasantly confused ?Crackles at bases ?Noe  edema ?Central line still a bit oozy, Dr. Haroldine Laws to reinforce with sutures ?Moves all 4 ext to command ?AOx3 ? ?WBC/plts stable ?Pct 1.7 ? ? ?Resolved Hospital Problem list    ?N/A ? ?Assessment & Plan:  ?Acute on chronic biventricular failure- this diagnosis is now being revisited. 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 ?Now euvolemic but continued diffuse airspace disease on CXR. May need to entertain ILD or atypical infection.  Will check RVP, rheum panel, CT chest.  Known SLE.  Titrate HFNC to sats > 90%.  May need bronch.  Discussed with CHF team and family.  ? ?Best Practice (right click and "Reselect all SmartList Selections" daily)  ? ?Diet/type: Regular consistency (see orders) ?DVT prophylaxis: heparin gtt ?GI prophylaxis: H2B ?Lines: Dialysis Catheter ?Foley:  N/A ?Code Status:  full code ?Last date of multidisciplinary goals of care discussion [Pending] ? ?31 min cc time ?Erskine Emery MD PCCM ? ? ?

## 2021-09-19 NOTE — Progress Notes (Signed)
eLink Physician-Brief Progress Note ?Patient Name: Roy Dyer ?DOB: 11-30-1965 ?MRN: [id number] ? ? ?Date of Service ? 09/19/2021  ?HPI/Events of Note ? Patient with a Code Blue, on camera into the room the PCCM ground crew is in the room running the code.  ?eICU Interventions ? Deferred code management to PCCM ground crew.  ? ? ? ?  ? ?Kerry Kass [name] ?09/19/2021, 10:33 PM ?

## 2021-09-19 NOTE — Progress Notes (Addendum)
Patient ID: Roy Dyer, male   DOB: 04/15/1966, 56 y.o.   MRN: [id number] ?[facility] ?Progress Note  ? ?Assessment/ Plan:   ?1. 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  Presented with acute kidney injury that was suspected to be from CHF decompensation however, continued to worsen and was refractory to diuretics.  He was transferred to the ICU for CRRT which was initiated on 5/6 ?- Continue CRRT  ?- UF goal keep even  ?- Will transition back to 4 K fluids  ?- hopeful for renal recovery  ?- will discuss timing of trial off of CRRT with CHF ?- stopped heparin through circuit - he's on systemic heparin and per RN trouble with bleeding from line insertion site  ? ?2.  Acute exacerbation of congestive heart failure: Right heart catheterization with elevated biventricular pressures with high cardiac output and severe mixed pulmonary hypertension.  Started on CRRT for extracorporeal volume unloading after refractory to diuretics.  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 ?3. Acute hypoxic respiratory failure  ?- optimize volume status with CRRT ?- supportive care per pulm ? ?4. CKD stage III  ?- baseline Cr around 1.7  ? ?5.  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  ? ?6.  Immunosuppression   ?- Continue maintenance immunosuppressive therapy with Envarsus, mycophenolate.   ?- Patient is now back on home prednisone 5 mg daily as well  ? ?7. Hypotension  ?- on midodrine   ? ?8. Atrial flutter ?- noted. Per primary team  ? ?9.  Hyponatremia: Secondary to CHF exacerbation  and acute kidney injury, improving with volume unloading/CRRT. ? ?10. Hypophos - repleting; now off phoslo ? ?Disposition - continue monitoring in ICU ? ?Subjective:   ?He had 819 mL UF with CRRT over 5/8.  Had no uop charted over 5/8.  Seen and examined on CRRT.  Feels ok this am.  Breathing is better and per RN confusion is better; his wife is at bedside and she agrees.  ? ?Review of systems:  ?He reports shortness of breath "doing better" ?No chest pain  ?Denies n/v ?  ? ?Objective:   ?BP 106/65   Pulse 85   Temp 97.6 ?F (36.4 ?C) (Oral)   Resp 17   Ht '5\' 7"'$  (1.702 m)   Wt 72.7 kg   SpO2 (!) 87%   BMI 25.10 kg/m?  ? ?Intake/Output Summary (Last 24 hours) at 09/19/2021 0536 ?Last data filed at 09/19/2021 0500 ?Gross per 24 hour  ?Intake 603.26 ml  ?Output 1031 ml  ?Net -427.74 ml  ? ?Weight change:  ? ?Physical Exam:    ?Gen: adult male in bed on heated high flow  ?CVS: S1S2 do not appreciate Pericardial rub this am  ?Resp: clear and decreased breath sounds; on heated high flow; increased work of breathing with exertion or speech - improved ?Abd: Soft, flat, nontender ?Ext: no lower extremity edema ?Neuro-oriented to person more conversant this am ?Psych no anxiety or agitation  ?Access RIJ nontunn HD catheter; bruit and thrill LUE AVF ? ? ?Imaging: ?DG Chest  Port 1 View ? ?Result Date: 09/18/2021 ?CLINICAL DATA:  ARDS. EXAM: PORTABLE CHEST 1 VIEW COMPARISON:  Sep 16, 2021. FINDINGS: Stable cardiomegaly. Stable right sided defibrillator. Decreased bilateral lung opacities are noted suggesting improving edema or pneumonia. Bony thorax is unremarkable. IMPRESSION: Decreased bilateral lung opacities are noted suggesting improving edema or pneumonia. Electronically Signed   By: Marijo Conception M.D.   On: 09/18/2021 08:05   ? ?Labs: ?BMET ?Recent Labs  ?Lab 09/14/21 ?0308 09/15/21 ?7322 09/16/21 ?0345 09/16/21 ?1637 09/16/21 ?2139 09/17/21 ?0211 09/17/21 ?1643 09/17/21 ?1906 09/17/21 ?2113 09/18/21 ?0254 09/18/21 ?2706  09/18/21 ?1537  ?NA 133* 133* 131* 125* 134* 135 135 135 136 135  --  138  ?K 4.1 4.6 4.1 4.6 4.3 4.9 4.7 4.5 4.5 6.0* 5.1 4.6  ?CL 99 98 94* 91*  --  100 100  --   --  101  --  101  ?CO2 19* 23 22 19*  --  23 22  --   --  26  --  27  ?GLUCOSE 159* 120* 102* 311*  --  128* 178*  --   --  189*  --  132*  ?BUN 49* 61* 70* 63*  --  47* 32*  --   --  29*  --  24*  ?CREATININE 2.40* 3.48* 3.98* 3.50*  --  2.64* 2.00*  --   --  1.88*  --  1.83*  ?CALCIUM 8.6* 8.5* 8.5* 8.1*  --  8.6* 8.8*  --   --  9.0  --  9.1  ?PHOS 4.5  --  6.2* 5.6*  --  4.9* 3.3  --   --  2.9  --  2.4*  ? ?CBC ?Recent Labs  ?Lab 09/14/21 ?0308 09/15/21 ?2376 09/16/21 ?0345 09/16/21 ?2139 09/17/21 ?0211 09/17/21 ?1906 09/17/21 ?2113 09/18/21 ?2831 09/19/21 ?0441  ?WBC 21.2*   < > 11.3*  --  12.8*  --   --  17.3* 16.6*  ?NEUTROABS 18.0*  --   --   --   --   --   --   --   --   ?HGB 13.2   < > 11.3*   < > 12.8* 15.0 15.0 13.3 13.2  ?HCT 42.5   < > 37.2*   < > 42.2 44.0 44.0 45.2 45.1  ?MCV 77.1*   < > 76.5*  --  76.7*  --   --  78.1* 78.7*  ?PLT 354   < > 300  --  351  --   --  448* 402*  ? < > = values in this interval not displayed.  ? ? ?Medications:   ? ? amiodarone  100 mg Oral Daily  ? atorvastatin  80 mg Oral QHS  ? chlorhexidine  15 mL Mouth Rinse BID  ? Chlorhexidine Gluconate Cloth  6 each Topical Daily  ? clonazepam  0.5 mg Oral QHS  ? clopidogrel  75 mg Oral Daily  ? famotidine  20 mg Oral QHS  ? feeding supplement  237 mL Oral TID BM  ? fluticasone  2 spray Each Nare Daily  ? guaiFENesin  600 mg Oral BID  ? mouth rinse  15 mL Mouth Rinse q12n4p  ? mexiletine  300 mg Oral BID  ? midodrine  5 mg Oral TID WC  ? multivitamin with minerals  1 tablet Oral Daily  ? mycophenolate  750 mg Oral BID  ? pantoprazole  40 mg Oral Daily  ? PARoxetine  20 mg Oral QPM  ?  predniSONE  5 mg Oral Q breakfast  ? sodium chloride flush  3 mL Intravenous Q12H  ? sodium chloride flush  3 mL Intravenous Q12H  ? sodium chloride flush  3 mL Intravenous Q12H  ?  tacrolimus ER  0.75 mg Oral Daily  ? Thrombi-Pad  1 each Topical Once  ? urea   Topical BID  ? ?Roy Desanctis, MD ?09/19/2021, 5:59 AM ? ? ?Addendum ?- stopping CRRT for his CT scan.  We are not restarting.   ? ?Note pt stated that Cr had been 2 recently pre-admission ? ?Rechecking tacrolimus level - not really possible for lower envarsus dose per pharmacy.  Pt is now s/p CRRT as well and may be in range with change in clearance.  ? ?Roy Desanctis, MD ?10:12 AM ?09/19/2021 ? ? ?

## 2021-09-19 NOTE — Progress Notes (Signed)
Pharmacy Antibiotic Note ? ?Roy Dyer is a 56 y.o. male admitted on 10/08/2021 with pneumonia.  Pharmacy has been consulted for Vancomycin and Meropenem dosing. Pt s/p CAP treatment 5/1-5/5. Continues with high 02 needs and diffuse airspace disease on CXR.  ? ?Pt was on CRRT for AKI but trialing off for now. ? ?Plan: ?Meropenem 1gm IV q12h ?Vancomycin '1500mg'$  IV now. Will f/u SCr in a.m. for further dosing since trialing off CRRT ?Will f/u renal function, micro data, and pt's clinical condition ?Vanc levels prn ? ? ?Height: '5\' 7"'$  (170.2 cm) ?Weight: 73.5 kg (162 lb 0.6 oz) ?IBW/kg (Calculated) : 66.1 ? ?Temp (24hrs), Avg:97.6 ?F (36.4 ?C), Min:97.6 ?F (36.4 ?C), Max:97.7 ?F (36.5 ?C) ? ?Recent Labs  ?Lab 09/15/21 ?7092 09/16/21 ?0345 09/16/21 ?1637 09/17/21 ?0211 09/17/21 ?1643 09/18/21 ?9574 09/18/21 ?1537 09/19/21 ?0441  ?WBC 16.2* 11.3*  --  12.8*  --  17.3*  --  16.6*  ?CREATININE 3.48* 3.98*   < > 2.64* 2.00* 1.88* 1.83* 1.70*  ? < > = values in this interval not displayed.  ?  ?Estimated Creatinine Clearance: 46.4 mL/min (A) (by C-G formula based on SCr of 1.7 mg/dL (H)).   ? ?Allergies  ?Allergen Reactions  ? Triptans Palpitations  ?  Reaction to Maxalt  ? ? ?Antimicrobials this admission: ?5/9 Meropenem >>  ?5/9 Vanc >>  ?5/1 Azith >>5/5 ?5/1 Rocephin >>5/3 ?5/4 Augmentin >>5/5 ? ?Microbiology results: ?5/6 MRSA PCR: negative ? ?Thank you for allowing pharmacy to be a part of this patient?s care. ? ?Sherlon Handing, PharmD, BCPS ?Please see amion for complete clinical pharmacist phone list ?09/19/2021 3:54 PM ? ?

## 2021-09-20 ENCOUNTER — Inpatient Hospital Stay (HOSPITAL_COMMUNITY): Payer: Managed Care, Other (non HMO)

## 2021-09-20 ENCOUNTER — Other Ambulatory Visit: Payer: Self-pay

## 2021-09-20 DIAGNOSIS — I469 Cardiac arrest, cause unspecified: Secondary | ICD-10-CM

## 2021-09-20 DIAGNOSIS — I255 Ischemic cardiomyopathy: Secondary | ICD-10-CM

## 2021-09-20 DIAGNOSIS — I5043 Acute on chronic combined systolic (congestive) and diastolic (congestive) heart failure: Secondary | ICD-10-CM | POA: Diagnosis not present

## 2021-09-20 DIAGNOSIS — G931 Anoxic brain damage, not elsewhere classified: Secondary | ICD-10-CM | POA: Diagnosis not present

## 2021-09-20 DIAGNOSIS — N189 Chronic kidney disease, unspecified: Secondary | ICD-10-CM | POA: Diagnosis not present

## 2021-09-20 DIAGNOSIS — R0902 Hypoxemia: Secondary | ICD-10-CM | POA: Diagnosis not present

## 2021-09-20 DIAGNOSIS — N179 Acute kidney failure, unspecified: Secondary | ICD-10-CM | POA: Diagnosis not present

## 2021-09-20 LAB — BODY FLUID CELL COUNT WITH DIFFERENTIAL
Eos, Fluid: 0 %
Lymphs, Fluid: 13 %
Monocyte-Macrophage-Serous Fluid: 39 % — ABNORMAL LOW (ref 50–90)
Neutrophil Count, Fluid: 48 % — ABNORMAL HIGH (ref 0–25)
Total Nucleated Cell Count, Fluid: 181 cu mm (ref 0–1000)

## 2021-09-20 LAB — ENA+DNA/DS+ANTICH+CENTRO+JO...
Anti JO-1: <0.2 AI (ref 0.0–0.9)
Centromere Ab Screen: >8 AI — ABNORMAL HIGH (ref 0.0–0.9)
Chromatin Ab SerPl-aCnc: <0.2 AI (ref 0.0–0.9)
ENA SM Ab Ser-aCnc: <0.2 AI (ref 0.0–0.9)
Ribonucleic Protein: 2.2 AI — ABNORMAL HIGH (ref 0.0–0.9)
SSA (Ro) (ENA) Antibody, IgG: <0.2 AI (ref 0.0–0.9)
SSB (La) (ENA) Antibody, IgG: <0.2 AI (ref 0.0–0.9)
Scleroderma (Scl-70) (ENA) Antibody, IgG: <0.2 AI (ref 0.0–0.9)
ds DNA Ab: 1 IU/mL (ref 0–9)

## 2021-09-20 LAB — GLUCOSE, CAPILLARY
Glucose-Capillary: 125 mg/dL — ABNORMAL HIGH (ref 70–99)
Glucose-Capillary: 151 mg/dL — ABNORMAL HIGH (ref 70–99)
Glucose-Capillary: 177 mg/dL — ABNORMAL HIGH (ref 70–99)
Glucose-Capillary: 181 mg/dL — ABNORMAL HIGH (ref 70–99)
Glucose-Capillary: 191 mg/dL — ABNORMAL HIGH (ref 70–99)
Glucose-Capillary: 232 mg/dL — ABNORMAL HIGH (ref 70–99)

## 2021-09-20 LAB — CBC
HCT: 39.7 % (ref 39.0–52.0)
HCT: 41.5 % (ref 39.0–52.0)
Hemoglobin: 11.8 g/dL — ABNORMAL LOW (ref 13.0–17.0)
Hemoglobin: 12 g/dL — ABNORMAL LOW (ref 13.0–17.0)
MCH: 23.1 pg — ABNORMAL LOW (ref 26.0–34.0)
MCH: 23.2 pg — ABNORMAL LOW (ref 26.0–34.0)
MCHC: 28.9 g/dL — ABNORMAL LOW (ref 30.0–36.0)
MCHC: 29.7 g/dL — ABNORMAL LOW (ref 30.0–36.0)
MCV: 78.1 fL — ABNORMAL LOW (ref 80.0–100.0)
MCV: 80 fL (ref 80.0–100.0)
Platelets: 348 10*3/uL (ref 150–400)
Platelets: 421 10*3/uL — ABNORMAL HIGH (ref 150–400)
RBC: 5.08 MIL/uL (ref 4.22–5.81)
RBC: 5.19 MIL/uL (ref 4.22–5.81)
RDW: 19 % — ABNORMAL HIGH (ref 11.5–15.5)
RDW: 19 % — ABNORMAL HIGH (ref 11.5–15.5)
WBC: 21.8 10*3/uL — ABNORMAL HIGH (ref 4.0–10.5)
WBC: 23.2 10*3/uL — ABNORMAL HIGH (ref 4.0–10.5)
nRBC: 0.1 % (ref 0.0–0.2)
nRBC: 0.3 % — ABNORMAL HIGH (ref 0.0–0.2)

## 2021-09-20 LAB — POCT I-STAT 7, (LYTES, BLD GAS, ICA,H+H)
Acid-Base Excess: 0 mmol/L (ref 0.0–2.0)
Bicarbonate: 23.9 mmol/L (ref 20.0–28.0)
Calcium, Ion: 1.15 mmol/L (ref 1.15–1.40)
HCT: 38 % — ABNORMAL LOW (ref 39.0–52.0)
Hemoglobin: 12.9 g/dL — ABNORMAL LOW (ref 13.0–17.0)
O2 Saturation: 100 %
Patient temperature: 99.5
Potassium: 4.7 mmol/L (ref 3.5–5.1)
Sodium: 135 mmol/L (ref 135–145)
TCO2: 25 mmol/L (ref 22–32)
pCO2 arterial: 36.8 mmHg (ref 32–48)
pH, Arterial: 7.422 (ref 7.35–7.45)
pO2, Arterial: 453 mmHg — ABNORMAL HIGH (ref 83–108)

## 2021-09-20 LAB — T4, FREE: Free T4: 1.26 ng/dL — ABNORMAL HIGH (ref 0.61–1.12)

## 2021-09-20 LAB — LACTIC ACID, PLASMA
Lactic Acid, Venous: 1.4 mmol/L (ref 0.5–1.9)
Lactic Acid, Venous: 1.1 mmol/L (ref 0.5–1.9)

## 2021-09-20 LAB — RENAL FUNCTION PANEL
Albumin: 2.2 g/dL — ABNORMAL LOW (ref 3.5–5.0)
Anion gap: 11 (ref 5–15)
BUN: 41 mg/dL — ABNORMAL HIGH (ref 6–20)
CO2: 21 mmol/L — ABNORMAL LOW (ref 22–32)
Calcium: 9 mg/dL (ref 8.9–10.3)
Chloride: 102 mmol/L (ref 98–111)
Creatinine, Ser: 3.09 mg/dL — ABNORMAL HIGH (ref 0.61–1.24)
GFR, Estimated: 23 mL/min — ABNORMAL LOW (ref >60)
Glucose, Bld: 228 mg/dL — ABNORMAL HIGH (ref 70–99)
Phosphorus: 5.7 mg/dL — ABNORMAL HIGH (ref 2.5–4.6)
Potassium: 4.6 mmol/L (ref 3.5–5.1)
Sodium: 134 mmol/L — ABNORMAL LOW (ref 135–145)

## 2021-09-20 LAB — MAGNESIUM
Magnesium: 2.8 mg/dL — ABNORMAL HIGH (ref 1.7–2.4)
Magnesium: 3 mg/dL — ABNORMAL HIGH (ref 1.7–2.4)

## 2021-09-20 LAB — COMPREHENSIVE METABOLIC PANEL
ALT: 36 U/L (ref 0–44)
AST: 88 U/L — ABNORMAL HIGH (ref 15–41)
Albumin: 2.1 g/dL — ABNORMAL LOW (ref 3.5–5.0)
Alkaline Phosphatase: 191 U/L — ABNORMAL HIGH (ref 38–126)
Anion gap: 17 — ABNORMAL HIGH (ref 5–15)
BUN: 30 mg/dL — ABNORMAL HIGH (ref 6–20)
CO2: 18 mmol/L — ABNORMAL LOW (ref 22–32)
Calcium: 10.1 mg/dL (ref 8.9–10.3)
Chloride: 100 mmol/L (ref 98–111)
Creatinine, Ser: 2.75 mg/dL — ABNORMAL HIGH (ref 0.61–1.24)
GFR, Estimated: 27 mL/min — ABNORMAL LOW (ref >60)
Glucose, Bld: 162 mg/dL — ABNORMAL HIGH (ref 70–99)
Potassium: 4.7 mmol/L (ref 3.5–5.1)
Sodium: 135 mmol/L (ref 135–145)
Total Bilirubin: 2.2 mg/dL — ABNORMAL HIGH (ref 0.3–1.2)
Total Protein: 4.9 g/dL — ABNORMAL LOW (ref 6.5–8.1)

## 2021-09-20 LAB — TROPONIN I (HIGH SENSITIVITY)
Troponin I (High Sensitivity): 396 ng/L (ref <18)
Troponin I (High Sensitivity): 589 ng/L (ref <18)
Troponin I (High Sensitivity): 646 ng/L (ref <18)

## 2021-09-20 LAB — ECHOCARDIOGRAM LIMITED
Height: 67 in
Weight: 2592.61 oz

## 2021-09-20 LAB — COOXEMETRY PANEL
Carboxyhemoglobin: 1.4 % (ref 0.5–1.5)
Carboxyhemoglobin: 1.6 % — ABNORMAL HIGH (ref 0.5–1.5)
Methemoglobin: 0.7 % (ref 0.0–1.5)
Methemoglobin: 0.7 % (ref 0.0–1.5)
O2 Saturation: 94.2 %
O2 Saturation: 96.4 %
Total hemoglobin: 11.4 g/dL — ABNORMAL LOW (ref 12.0–16.0)
Total hemoglobin: 13.3 g/dL (ref 12.0–16.0)

## 2021-09-20 LAB — PROTIME-INR
INR: 1.9 — ABNORMAL HIGH (ref 0.8–1.2)
Prothrombin Time: 21.9 seconds — ABNORMAL HIGH (ref 11.4–15.2)

## 2021-09-20 LAB — ANA W/REFLEX IF POSITIVE: Anti Nuclear Antibody (ANA): POSITIVE — AB

## 2021-09-20 LAB — RHEUMATOID FACTOR: Rheumatoid fact SerPl-aCnc: 12.8 IU/mL (ref <14.0)

## 2021-09-20 LAB — APTT
aPTT: 35 seconds (ref 24–36)
aPTT: 58 seconds — ABNORMAL HIGH (ref 24–36)

## 2021-09-20 LAB — TSH: TSH: 10.826 u[IU]/mL — ABNORMAL HIGH (ref 0.350–4.500)

## 2021-09-20 LAB — PROCALCITONIN: Procalcitonin: 2.9 ng/mL

## 2021-09-20 LAB — PHOSPHORUS: Phosphorus: 6.3 mg/dL — ABNORMAL HIGH (ref 2.5–4.6)

## 2021-09-20 LAB — HEPARIN LEVEL (UNFRACTIONATED): Heparin Unfractionated: >1.1 IU/mL — ABNORMAL HIGH (ref 0.30–0.70)

## 2021-09-20 MED ORDER — ORAL CARE MOUTH RINSE
15.0000 mL | OROMUCOSAL | Status: DC
  Administered 2021-09-20 (×3): 15 mL via OROMUCOSAL

## 2021-09-20 MED ORDER — CLONAZEPAM 0.25 MG PO TBDP
0.5000 mg | ORAL_TABLET | Freq: Every day | ORAL | Status: DC
  Administered 2021-09-20 – 2021-09-21 (×2): 0.5 mg
  Filled 2021-09-20 (×2): qty 2

## 2021-09-20 MED ORDER — VASOPRESSIN 20 UNITS/100 ML INFUSION FOR SHOCK
0.0000 [IU]/min | INTRAVENOUS | Status: DC
  Administered 2021-09-20: 0.03 [IU]/min via INTRAVENOUS
  Filled 2021-09-20: qty 100

## 2021-09-20 MED ORDER — ADULT MULTIVITAMIN W/MINERALS CH
1.0000 | ORAL_TABLET | Freq: Every day | ORAL | Status: DC
  Administered 2021-09-20: 1
  Filled 2021-09-20: qty 1

## 2021-09-20 MED ORDER — PROPOFOL 1000 MG/100ML IV EMUL
5.0000 ug/kg/min | INTRAVENOUS | Status: DC
  Administered 2021-09-20: 10 ug/kg/min via INTRAVENOUS
  Administered 2021-09-21: 30 ug/kg/min via INTRAVENOUS
  Administered 2021-09-21: 5 ug/kg/min via INTRAVENOUS
  Administered 2021-09-22: 10 ug/kg/min via INTRAVENOUS
  Filled 2021-09-20 (×3): qty 100

## 2021-09-20 MED ORDER — PROSOURCE TF PO LIQD
45.0000 mL | Freq: Three times a day (TID) | ORAL | Status: DC
  Administered 2021-09-20 – 2021-09-22 (×7): 45 mL
  Filled 2021-09-20 (×7): qty 45

## 2021-09-20 MED ORDER — PRIMAQUINE PHOSPHATE 26.3 (15 BASE) MG PO TABS
30.0000 mg | ORAL_TABLET | Freq: Every day | ORAL | Status: DC
  Administered 2021-09-20 – 2021-09-22 (×3): 30 mg
  Filled 2021-09-20 (×3): qty 2

## 2021-09-20 MED ORDER — SODIUM CHLORIDE 0.9 % IV SOLN
100.0000 mg | Freq: Every day | INTRAVENOUS | Status: DC
  Administered 2021-09-20 – 2021-09-22 (×3): 100 mg via INTRAVENOUS
  Filled 2021-09-20 (×3): qty 5

## 2021-09-20 MED ORDER — MIDODRINE HCL 5 MG PO TABS
5.0000 mg | ORAL_TABLET | Freq: Three times a day (TID) | ORAL | Status: DC
  Filled 2021-09-20: qty 1

## 2021-09-20 MED ORDER — CLINDAMYCIN PHOSPHATE 900 MG/50ML IV SOLN
900.0000 mg | Freq: Three times a day (TID) | INTRAVENOUS | Status: DC
  Administered 2021-09-20 – 2021-09-22 (×7): 900 mg via INTRAVENOUS
  Filled 2021-09-20 (×9): qty 50

## 2021-09-20 MED ORDER — PAROXETINE HCL 20 MG PO TABS
20.0000 mg | ORAL_TABLET | Freq: Every evening | ORAL | Status: DC
  Administered 2021-09-20 – 2021-09-21 (×2): 20 mg
  Filled 2021-09-20 (×2): qty 1

## 2021-09-20 MED ORDER — CHLORHEXIDINE GLUCONATE 0.12% ORAL RINSE (MEDLINE KIT)
15.0000 mL | Freq: Two times a day (BID) | OROMUCOSAL | Status: DC
  Administered 2021-09-20 – 2021-09-22 (×4): 15 mL via OROMUCOSAL

## 2021-09-20 MED ORDER — FAMOTIDINE 20 MG PO TABS
20.0000 mg | ORAL_TABLET | Freq: Every day | ORAL | Status: DC
  Administered 2021-09-20 – 2021-09-21 (×2): 20 mg
  Filled 2021-09-20 (×2): qty 1

## 2021-09-20 MED ORDER — CHLORHEXIDINE GLUCONATE 0.12% ORAL RINSE (MEDLINE KIT)
15.0000 mL | Freq: Two times a day (BID) | OROMUCOSAL | Status: DC
  Administered 2021-09-20 (×2): 15 mL via OROMUCOSAL

## 2021-09-20 MED ORDER — PANTOPRAZOLE 2 MG/ML SUSPENSION
40.0000 mg | Freq: Every day | ORAL | Status: DC
  Administered 2021-09-20 – 2021-09-22 (×3): 40 mg
  Filled 2021-09-20 (×3): qty 20

## 2021-09-20 MED ORDER — AMIODARONE HCL 200 MG PO TABS
100.0000 mg | ORAL_TABLET | Freq: Every day | ORAL | Status: DC
  Administered 2021-09-20: 100 mg
  Filled 2021-09-20: qty 1

## 2021-09-20 MED ORDER — INSULIN ASPART 100 UNIT/ML IJ SOLN
0.0000 [IU] | INTRAMUSCULAR | Status: DC
  Administered 2021-09-20 (×3): 2 [IU] via SUBCUTANEOUS
  Administered 2021-09-20: 1 [IU] via SUBCUTANEOUS
  Administered 2021-09-20: 3 [IU] via SUBCUTANEOUS
  Administered 2021-09-20: 2 [IU] via SUBCUTANEOUS
  Administered 2021-09-21: 5 [IU] via SUBCUTANEOUS
  Administered 2021-09-21 (×3): 3 [IU] via SUBCUTANEOUS
  Administered 2021-09-21: 2 [IU] via SUBCUTANEOUS

## 2021-09-20 MED ORDER — LEVETIRACETAM IN NACL 1500 MG/100ML IV SOLN
1500.0000 mg | Freq: Once | INTRAVENOUS | Status: AC
  Administered 2021-09-20: 1500 mg via INTRAVENOUS
  Filled 2021-09-20: qty 100

## 2021-09-20 MED ORDER — CLOPIDOGREL BISULFATE 75 MG PO TABS
75.0000 mg | ORAL_TABLET | Freq: Every day | ORAL | Status: DC
  Administered 2021-09-20 – 2021-09-22 (×3): 75 mg
  Filled 2021-09-20 (×3): qty 1

## 2021-09-20 MED ORDER — MEXILETINE HCL 150 MG PO CAPS
300.0000 mg | ORAL_CAPSULE | Freq: Two times a day (BID) | ORAL | Status: DC
  Administered 2021-09-20: 300 mg
  Filled 2021-09-20: qty 2

## 2021-09-20 MED ORDER — VITAL 1.5 CAL PO LIQD
1000.0000 mL | ORAL | Status: DC
  Administered 2021-09-20 – 2021-09-21 (×2): 1000 mL

## 2021-09-20 MED ORDER — SODIUM CHLORIDE 0.9 % IV SOLN
1000.0000 mg | INTRAVENOUS | Status: DC
  Administered 2021-09-20 – 2021-09-22 (×3): 1000 mg via INTRAVENOUS
  Filled 2021-09-20 (×3): qty 16

## 2021-09-20 MED ORDER — TACROLIMUS 0.5 MG PO CAPS
0.5000 mg | ORAL_CAPSULE | Freq: Two times a day (BID) | ORAL | Status: DC
  Administered 2021-09-20: 0.5 mg via ORAL
  Filled 2021-09-20: qty 1

## 2021-09-20 MED ORDER — MYCOPHENOLATE 200 MG/ML ORAL SUSPENSION
250.0000 mg | Freq: Two times a day (BID) | ORAL | Status: DC
  Filled 2021-09-20: qty 1.25

## 2021-09-20 MED ORDER — RENA-VITE PO TABS
1.0000 | ORAL_TABLET | Freq: Every day | ORAL | Status: DC
  Administered 2021-09-20 – 2021-09-21 (×2): 1
  Filled 2021-09-20 (×2): qty 1

## 2021-09-20 MED ORDER — MIDODRINE HCL 5 MG PO TABS
5.0000 mg | ORAL_TABLET | Freq: Three times a day (TID) | ORAL | Status: DC
  Administered 2021-09-20 (×2): 5 mg
  Filled 2021-09-20: qty 1

## 2021-09-20 MED ORDER — ORAL CARE MOUTH RINSE
15.0000 mL | OROMUCOSAL | Status: DC
  Administered 2021-09-20 – 2021-09-22 (×22): 15 mL via OROMUCOSAL

## 2021-09-20 MED ORDER — ALBUMIN HUMAN 5 % IV SOLN: 12.5000 g | Freq: Once | INTRAVENOUS | Status: DC

## 2021-09-20 MED ORDER — FENTANYL CITRATE PF 50 MCG/ML IJ SOSY
25.0000 ug | PREFILLED_SYRINGE | INTRAMUSCULAR | Status: DC | PRN
  Administered 2021-09-20 (×2): 50 ug via INTRAVENOUS
  Administered 2021-09-21 – 2021-09-22 (×7): 100 ug via INTRAVENOUS
  Filled 2021-09-20 (×9): qty 2

## 2021-09-20 MED ORDER — MYCOPHENOLATE MOFETIL 250 MG PO CAPS
250.0000 mg | ORAL_CAPSULE | Freq: Two times a day (BID) | ORAL | Status: DC
Start: 2021-09-20 — End: 2021-09-20
  Filled 2021-09-20: qty 1

## 2021-09-20 MED ORDER — LEVETIRACETAM IN NACL 1500 MG/100ML IV SOLN
1500.0000 mg | Freq: Once | INTRAVENOUS | Status: DC
  Filled 2021-09-20: qty 100

## 2021-09-20 MED ORDER — ATORVASTATIN CALCIUM 80 MG PO TABS
80.0000 mg | ORAL_TABLET | Freq: Every day | ORAL | Status: DC
  Administered 2021-09-20 – 2021-09-21 (×2): 80 mg
  Filled 2021-09-20 (×2): qty 1

## 2021-09-20 MED ORDER — PROPOFOL 1000 MG/100ML IV EMUL
INTRAVENOUS | Status: AC
Start: 2021-09-20 — End: 2021-09-20
  Filled 2021-09-20: qty 100

## 2021-09-20 NOTE — Progress Notes (Signed)
? ?  NAME:  Roy Dyer, MRN:  [id number], DOB:  June 27, 1965, LOS: 9 ?ADMISSION DATE:  09/12/2021, CONSULTATION DATE:  09/16/21 ?REFERRING MD:  TRH, CHIEF COMPLAINT:  SOB  ? ?History of Present Illness:  ?56 year old man with hx of ESRD s/p renal transplant now with CKD, CAD, Afib who presented with insidious onset SOB.  Found to be in biventricular failure, failed diuretic+inotrope trial.  Due to low Bps, worsening dyspnea and edema, transferred to ICU and PCCM to assume care from [facility]. ? ?Patient doing okay as long as he sits up, dyspnea with minimal movement. ? ?Pertinent  Medical History  ?History of renal transplant ?Aflutter ?SLE ?CAD ?OSA ? ? ?Significant Hospital Events: ?Including procedures, antibiotic start and stop dates in addition to other pertinent events   ?5/1 admitted ?5/3 R/HC ?[place] 09/14/21 ?RA = 11 (no significant v-waves) ?RV = 77/15 ?PA = 79/35 (55) ?PCW = 24 ?Fick cardiac output/index = 6.96/3.61 ?Thermo = 6.92/3.59 ?PVR = 4.5 WU ?RA sat = 99% ?PA sat = 71%, 77%, 79% ?SVC sat = 86% ?5/6 worsening resp distress, to ICU, CRRT started ?5/9 euvolemic, CRRT Dc'd, witnessed PEA arrest x 12 mins ? ?Interim History / Subjective:  ?Sedated on vent. ?Family at bedside. ?vEEG ongoing ?Bps have recovered post arrest. ? ?Objective   ?Blood pressure (!) 94/46, pulse 90, temperature 98.8 ?F (37.1 ?C), resp. rate 19, height '5\' 7"'$  (1.702 m), weight 73.5 kg, SpO2 100 %. ?CVP:  [5 mmHg-13 mmHg] 8 mmHg  ?Vent Mode: PRVC ?FiO2 (%):  [70 %-100 %] 90 % ?Set Rate:  [20 bmp] 20 bmp ?Vt Set:  [530 mL] 530 mL ?PEEP:  [10 cmH20] 10 cmH20 ?Plateau Pressure:  [25 TIR44-31 cmH20] 28 cmH20  ? ?Intake/Output Summary (Last 24 hours) at 09/20/2021 1134 ?Last data filed at 09/20/2021 1100 ?Gross per 24 hour  ?Intake 1395.77 ml  ?Output 0 ml  ?Net 1395.77 ml  ? ? ?Filed Weights  ? 09/17/21 0600 09/18/21 0500 09/19/21 0557  ?Weight: 77.3 kg 72.7 kg 73.5 kg  ? ? ?Examination: ?Intubated, sedated, triggering vent ?Lungs with crackles  bilaterally ?Ext warm ?Withdraw x 4 ?Abdomen soft ? ?Resolved Hospital Problem list   ?N/A ? ?Assessment & Plan:  ?Diffuse alveolar damage- in immunocompromised patient with hx SLE.  Edema effectively ruled out with aggressive fluid removal.  Differential is inflammatory, bacterial, fungal, viral pneumonitis.  Treating everything and praying something works unfortunately. ?- Vent support with LTVV and VAP prevention bundle ?- Vanc/ cefepime/ azithromycin/ eraxis/ clinda/ primaquine (PJP+ atypical CAP+ fungal + HCAP) ?- Hold CRRT today, discussed with Dr. Royce Macadamia ?- IV steroids, stop DMARDs ?- Bronch today with no evidence of DAH, f/u bacterial/fungal/viral cultures ?- Updated family at length ? ?Best Practice (right click and "Reselect all SmartList Selections" daily)  ? ?Diet/type: Regular consistency (see orders) ?DVT prophylaxis: heparin gtt ?GI prophylaxis: H2B ?Lines: Dialysis Catheter ?Foley:  N/A ?Code Status:  full code ?Last date of multidisciplinary goals of care discussion [Pending] ? ?35 min cc time ?Erskine Emery MD PCCM ? ? ?

## 2021-09-20 NOTE — Procedures (Signed)
Bronchoscopy Procedure Note ? ?Roy Dyer  ?[id number]  ?1966-03-12 ? ?Date:09/20/21  ?Time:8:14 AM  ? ?Provider Performing:Pearline Yerby C Tamala Julian  ? ?Procedure(s):  Flexible bronchoscopy with bronchial alveolar lavage (03491) ? ?Indication(s) ?ARDS ? ?Consent ?Verbal, family at bedside ? ?Anesthesia ?none ? ? ?Time Out ?Verified patient identification, verified procedure, site/side was marked, verified correct patient position, special equipment/implants available, medications/allergies/relevant history reviewed, required imaging and test results available. ? ? ?Sterile Technique ?Usual hand hygiene, masks, gowns, and gloves were used ? ? ?Procedure Description ?Bronchoscope advanced through endotracheal tube and into airway.  Airways were examined down to subsegmental level with findings noted below.   ?Following diagnostic evaluation, BAL(s) performed in RML with normal saline and return of slightly cloudy fluid ? ?Findings:  ?Normal bronchial mucosa ?Minimal secretions ?ETT in good position ? ? ?Complications/Tolerance ?None; patient tolerated the procedure well. ?Chest X-ray is not needed post procedure. ? ? ?EBL ?Minimal ? ? ?Specimen(s) ?RML BAL ? ?

## 2021-09-20 NOTE — Progress Notes (Signed)
Nutrition Follow-up ? ?DOCUMENTATION CODES:  ? ?Non-severe (moderate) malnutrition in context of chronic illness ? ?INTERVENTION:  ? ?Plan for Cortrak today given very poor po intake prior to intubation, pt will likely need temporary feeding access once extubated ? ?Tube Feeding via Cortrak:  ?Vital 1.5 at 60 ml/hr ?Begin TF at 20 ml/hr; titrate by 10 mL q 8 hours until goal rate of 60 ml/hr ?Pro-Source TF 45 mL TID ?Goal regimen provides 2280 kcals, 130 g of protein and 1094 mL of free water ? ?D/C MVI with Minerals for now; add Renal MVI daily ? ? ?NUTRITION DIAGNOSIS:  ? ?Moderate Malnutrition related to chronic illness (pericarditis, cardiomyopathy, CAD, renal disease, lupus) as evidenced by mild fat depletion, moderate muscle depletion, energy intake < or equal to 75% for > or equal to 1 month, percent weight loss. ? ?Being addressed via TF ? ?GOAL:  ? ?Patient will meet greater than or equal to 90% of their needs ? ?Progressing ? ?MONITOR:  ? ?PO intake, Supplement acceptance, Labs, Weight trends, I & O's, Diet advancement ? ?REASON FOR ASSESSMENT:  ? ?Consult ?Assessment of nutrition requirement/status ? ?ASSESSMENT:  ? ?Pt admitted with acute respiratory failure with hypoxia. PMH significant for aflutter, pleuritic chest pain d/t pericarditis, ischemic cardiomyopathy, CAD with multiple stents, CKD, afib, s/p renal transplant, prediabetes, lupus s/p AICD, avascular necrosis or the hip, Barrett's esophagus on PPI. ? ?5/01 Admitted ?5/03 [facility] ?5/06 Transferred to ICU for initiation of CRRT ?5/09 CRRT discontinued, witness PEA arrest x 12 minutes, Intubated ?5/10 Bronch with BAL ? ?Pt remains on vent support. CRRT currently on hold but may require re-initiation. EEG ongoing ?Off pressors, off sedation-not waking up ? ?OG tube extends into stomach per chest xray ? ?PO intake 10-15% prior to intubation, poor appetite, not eating well. Pt also with malnutrition upon exam.  ? ?Phosphorus up to 5.7 but binder therapy  remains on hold as likely will required restarting CRRT ? ?Current wt 73.5 kg; up from 72.7 kg yesterday ?Admit weight 81.1 kg ? ?Labs: phosphorus 5.7, Creatinine 3.09, BUN 41, sodium 134, potassium wdl ?Meds: ss novolog, solumedrol, MVI with minerals ? ? ?Diet Order:   ?Diet Order   ? ?       ?  Diet NPO time specified  Diet effective now       ?  ? ?  ?  ? ?  ? ? ?EDUCATION NEEDS:  ? ?Education needs have been addressed ? ?Skin:  Skin Assessment: Reviewed RN Assessment ? ?Last BM:  5/9 ? ?Height:  ? ?Ht Readings from Last 1 Encounters:  ?09/25/2021 _0  (1.702 m)  ? ? ?Weight:  ? ?Wt Readings from Last 1 Encounters:  ?09/19/21 73.5 kg  ? ? ? ?BMI:  Body mass index is 25.38 kg/m?. ? ?Estimated Nutritional Needs:  ? ?Kcal:  2100-2300 kcals ? ?Protein:  115-145 g ? ?Fluid:  >/= 1.8 L ? ? ?Kerman Passey MS, RDN, LDN, CNSC ?Registered Dietitian III ?Clinical Nutrition ?RD Pager and On-Call Pager Number Located in [place]  ? ?

## 2021-09-20 NOTE — Progress Notes (Signed)
eLink Physician-Brief Progress Note ?Patient Name: Roy Dyer ?DOB: 10-31-1965 ?MRN: [id number] ? ? ?Date of Service ? 09/20/2021  ?HPI/Events of Note ? Patient with Troponin trending up s/p cardiopulmonary resuscitation ( 396 -> 589).  ?eICU Interventions ? Will continue to trend Troponin, AM echocardiogram to r/o RWMA.  ? ? ? ?  ? ?Frederik Pear ?09/20/2021, 6:28 AM ?

## 2021-09-20 NOTE — Progress Notes (Signed)
Chaplain responded to request by nurse for support of pt's family.  Chaplain encountered family in waiting area.  Chaplain offered ministry of presence with the family, hearing the story of how their husband, father, brother came to be here.  Family explained they have been told he is gone. They requested prayers at bedside.  Chaplain and family circled the bed and prayed for their beloved Januel. ? ?Chaplain available throughout the night if additional support needed for this family. ? ?De Burrs ?Chaplain ?

## 2021-09-20 NOTE — Procedures (Addendum)
Patient Name: Roy Dyer  ?MRN: [id number]  ?Epilepsy Attending: Lora Havens  ?Referring Physician/Provider: Donnetta Simpers, MD ?Duration: 09/20/2021 0730 to 1120 ?  ?Patient history: 56 year old male status post cardiac arrest.  EEG to evaluate for seizure. ?  ?Level of alertness: comatose ?  ?AEDs during EEG study: Clonazepam ?  ?Technical aspects: This EEG study was done with scalp electrodes positioned according to the 10-20 International system of electrode placement. Electrical activity was acquired at a sampling rate of '500Hz'$  and reviewed with a high frequency filter of '70Hz'$  and a low frequency filter of '1Hz'$ . EEG data were recorded continuously and digitally stored.  ?  ?Description: EEG showed burst suppression pattern with bursts of 3 to 5 Hz theta-delta slowing lasting 2 to 3 seconds alternating with 2 to 3 seconds of generalized EEG suppression. Hyperventilation and photic stimulation were not performed.    ?  ?ABNORMALITY ?-Burst suppression, generalized ?  ?IMPRESSION: ?This study is suggestive of severe to profound diffuse encephalopathy, nonspecific to etiology. No seizures or epileptiform discharges were seen throughout the recording. ?  ?Lora Havens  ?  ?

## 2021-09-20 NOTE — Consult Note (Signed)
NEUROLOGY CONSULTATION NOTE  ? ?Date of service: Sep 20, 2021 ?Patient Name: Roy Dyer ?MRN:  [id number] ?DOB:  [birth year] ?Reason for consult: "concern for seizures s/p PEA arrest" ?Requesting Provider: Candee Furbish, MD ?_ _ _   _ __   _ __ _ _  __ __   _ __   __ _ ? ?History of Present Illness  ?Roy Dyer is a 56 y.o. male with PMH significant for CHF with AICD placement, hx of GERD, SLE, ESRD s/p renal transplant, CKD, CAD, Afibb who is admitted to the ICU with acute on chronic biventricular failure, acute hypoxemic respiratory failure 2/2 heart failure and ?pneumonia, AKI on CKD.  He had a witnessed cardiac arrest by the nurse.  Nurse was in the room and patient reported he was feeling poorly and lost consciousness.  He went pulseless.  10 minutes of CPR with ROSC and was given epinephrine, bicarb and calcium.  He was intubated.  After the arrest, he was noted to be more unresponsive despite not being on any sedation.  There was some questionable concern for seizures due to pupillary abnormalities but I did discuss with the RN at the bedside and he did not notice any shaking or twitching concerning for seizures. ? ?  ?ROS  ? ?Unable to obtain detailed review of system due to intubation and profound encephalopathy. ? ?Past History  ? ?Past Medical History:  ?Diagnosis Date  ? AICD (automatic cardioverter/defibrillator) present   ? Dr. Paschal Dopp follows remotely-yrly checks, Dr. Nishan,cardiology  ? Atrial flutter ([place]) 01/2021  ? dx'd when in hosp for pericarditis->amiodarone+eliquis  ? Avascular necrosis of bone of hip ([place]) 2010 surg  ? Left hip arthroplasty: from chronic systemic steroids taken for his Lupus  ? Barrett's esophagus 2017; 2021  ? Blood transfusion without reported diagnosis 2010, 2012  ? CAD (coronary artery disease)   ? **DAPT long term recommended by cards**.  a. stents RCA/Circ 2001 b. BMS to LAD 07/2010  c. 01/2017: s/p DES to RCA.   ? Cataract   ? left eye  ? CHF (congestive heart  failure) ([place])   ? Chronic headaches 02/2017  ? As of 01/2017, HA's no better, neurologist ordered CT.  ESR normal at that time.  HA'S COMPLETELY RESOLVED AFTER HE GOT ON CPAP 2019/2020.  ? Chronic renal insufficiency, stage 3 (moderate) ([facility])   ? GFR 55-60 (sCr 1.4-1.7 range)  ? Erythrocytosis   ? Pre and post transplant->on losartan for this  ? GERD (gastroesophageal reflux disease)   ? Hx of esoph stricture and dilatation.  +Barrett's esophagus+hx of aspiration pneumonia-->to get rpt EGD when he comes off DAPT (utd as of 01/29/18)  ? Gout   ? s/p renal transplant he was weened off of his allopurinol.  ? Hiatal hernia   ? History of adenomatous polyp of colon 09/2019  ? polyp x 1. Recall 5 yrs  ? 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 Secondary to SLE: HD 06/2011-10/2012 (then got renal transplant)  ? History of renal transplant 11/10/2012  ? 10/2012 [facility]   ? Hyperlipidemia   ? Hypertension   ? implantable cardiac defibrillator single chamber   ? Medtronic (due to low EF)  ? Ischemic cardiomyopathy   ? Chronic systolic dysfunction--  34/1937 EF 30-35%.   Single chamber ICD 11/20/10 ([name])  ? LBBB (left bundle branch block) 2023  ? Left ventricular thrombus 2012  ? Re-eval 02/2011 showed thrombus RESOLVED, so coumadin was  d/c'd (was on it for 52mo  ? Lupus ([place]   ? OSA on CPAP 2018  ? Dr. [name]9/2018: +OSA on home sleep studay; CPAP auto titrate 5-20 started 05/2017.  ? Osteoporosis   ? Pericarditis associated with systemic lupus erythematosus ([facility] 01/2021  ? hosp->colchicine  ? Prediabetes 12/2019  ? A1c stable long term at 5.6-5.7 until jump up to 6.2% Aug 2021.  ? ?Past Surgical History:  ?Procedure Laterality Date  ? AV FISTULA PLACEMENT  03/28/2011  ? Procedure: ARTERIOVENOUS (AV) FISTULA CREATION;  Surgeon: TRosetta Posner MD;  [facility]  Service: Vascular;  Laterality: Left;  Creation of Left radiocephallic cimino fistula  ? AV FISTULA PLACEMENT  04/27/2011  ? Procedure: ARTERIOVENOUS (AV)  FISTULA CREATION;  Surgeon: TRosetta Posner MD;  [facility]  Service: Vascular;  Laterality: Left;  left basilic vein transposition  ? BIOPSY  09/14/2019  ? Procedure: BIOPSY;  Surgeon: PJerene Bears MD;  [facility];  Service: Gastroenterology;;  ? CARDIAC CATHETERIZATION  08/2010; 04/22/20  ? 08/2010; 04/2020->mild/mod in-stent restenosis but none hemodynamically signif, severe LV dysfxn, frequenct PVCs-->referred to HF clinic for optimization of med mgmt  ? CARDIOVASCULAR STRESS TEST  07/2010; 03/2014  ? 03/2014 showed large old infarct and EF 30-35% but no ischemia  ? COLONOSCOPY  09/14/2019  ? Adenoma x 1.  Recall 5 yrs  ? COLONOSCOPY WITH PROPOFOL N/A 09/14/2019  ? Procedure: COLONOSCOPY WITH PROPOFOL;  Surgeon: PJerene Bears MD;  [facility];  Service: Gastroenterology;  Laterality: N/A;  ? CORONARY PRESSURE WIRE/FFR WITH 3D MAPPING N/A 04/22/2020  ? Procedure: Coronary Pressure Wire/FFR w/3D Mapping;  Surgeon: AWellington Hampshire MD;  [facility];  Service: Cardiovascular;  Laterality: N/A;  ? DEXA  07/05/2017  ? Normal bone density in radius and spine.  ? ESOPHAGOGASTRODUODENOSCOPY  02/2009  ? Done by Dr. SFuller Planfor hematemesis; esophagitis found.  Repeat recommended 09/2016, at which time he will also likely get his initial screening colonoscopy.  ? ESOPHAGOGASTRODUODENOSCOPY  09/14/2019  ? esophagitis (improved compared to 2017 EGD), Barrett's.  Stomach and duodenum normal.  ? ESOPHAGOGASTRODUODENOSCOPY (EGD) WITH PROPOFOL N/A 09/29/2015  ? REPEAT EGD RECOMMENDED 09/2016.  Barrett's esoph + mild chronic gastritis.  H pylori NEG. Procedure: ESOPHAGOGASTRODUODENOSCOPY (EGD) WITH PROPOFOL;  Surgeon: JJerene Bears MD;  [facility];  Service: Gastroenterology;  Laterality: N/A;  ? ESOPHAGOGASTRODUODENOSCOPY (EGD) WITH PROPOFOL N/A 09/14/2019  ? Procedure: ESOPHAGOGASTRODUODENOSCOPY (EGD) WITH PROPOFOL;  Surgeon: PJerene Bears MD;  [facility];   Service: Gastroenterology;  Laterality: N/A;  ? ICD GENERATOR CHANGEOUT N/A 04/11/2018  ? Procedure: ICD GENERATOR CHANGEOUT;  Surgeon: KDeboraha Sprang MD;  [facility];  Service: Cardiovascular;  Laterality: N/A;  ? INSERT / REPLACE / REMOVE PACEMAKER    ? medtronic        dr nFrances Nickels   [place]   icd only  ? KIDNEY TRANSPLANT  10/2012  ? [facility]Dr. M[facility]air Heys ? LEFT HEART CATH AND CORONARY ANGIOGRAPHY N/A 02/08/2017  ? PCA and DES to mRCA--needs DAPT for at least 6 mo.   Procedure: LEFT HEART CATH AND CORONARY ANGIOGRAPHY;  Surgeon: CSherren Mocha MD;  [facility];  Service: Cardiovascular;  Laterality: N/A;  ? LEFT HEART CATH AND CORONARY ANGIOGRAPHY N/A 04/22/2020  ? Procedure: LEFT HEART CATH AND CORONARY ANGIOGRAPHY;  Surgeon: AWellington Hampshire MD;  [facility];  Service: Cardiovascular;  Laterality: N/A;  ? PARTIAL HIP ARTHROPLASTY Left 2010  ? left  ? POLYPECTOMY  09/14/2019  ? Procedure: POLYPECTOMY;  Surgeon: Jerene Bears, MD;  [facility];  Service: Gastroenterology;;  ? RENAL BIOPSY    ? RIGHT HEART CATH N/A 10/08/2021  ? Procedure: RIGHT HEART CATH;  Surgeon: Jolaine Artist, MD;  Location: [facility];  Service: Cardiovascular;  Laterality: N/A;  ? stents    ? 05-2010 and 2- in 2010 and 1 in 2018.  ? TOTAL HIP ARTHROPLASTY  07/09/2011  ? Procedure: TOTAL HIP ARTHROPLASTY;  Surgeon: Kerin Salen, MD;  Location: [facility];  Service: Orthopedics;  Laterality: Right;  ? TRANSTHORACIC ECHOCARDIOGRAM  10/13/2020  ? 10/2020 EF 30-35% (slight improved), grd III DD, elev PA pressure, mold/mod tricusp regurg.  01/2021 and 07/2021 echo essentially unchanged.  ? TYMPANOSTOMY TUBE PLACEMENT  56 yrs old  ? US ECHOCARDIOGRAPHY  12/2010; 02/2014;08/2014;08/2019  ? 2015 EF still 25-30%, inc'd  PA press.  2016 EF 30-35%, sept/inf hypokin, mod tricusp regurg. 08/2019 EF 25-30%, global hypok, sev dil LV w/apical aneur, grd III DD, mod tricus  reg, mod pulm htn  ? ZIO PATCH  04/2020  ? HIGH PVC BURDEN.  NSVT, SVT, BBB.  ? ?Family History  ?Problem Relation Age of Onset  ? Kidney failure Mother   ? Lupus Mother   ? Stroke Mother   ? Diabetes Mother   ?

## 2021-09-20 NOTE — Progress Notes (Signed)
Responded to cardiac arrest.  ?Please see code sheet for details.  ?  ?Witnessed cardiac arrest, by nurse.  Nurse was in the room with the patient, patient complaining "going to die" feeling poorly when he lost consciousness.  Was in sinus rhythm with good sat at the time that he lost consciousness and became pulseless.  ? ?CPR started immediately.  ?Epi x 4, bicarb x 2, calcium.   ?PEA.   ? ?Intubated easily during code.  Sat initially 40s, gradually improved to 100% after about 5-10 min after intubation.   ? ?Prior to arrest, had become hypotensive at around 10pm.   ?Started on levophed, but pressure never improved.  ? ?Art line placed.  ?Post arrest remains unresponsive and not requiring sedation.  ?On levophed 40mg/min with map 70,  ?SCVO2 94%.  \ ?Adding vasopressin.  ? ?Overall, looks like his volume status had been improved to euvolemia by earlier today, concerning this may be worsening sepsis.   ?Already on broad antibiotics.   ?Echo tonight at bedside grossly similar to formal echo earlier today.  ? ?Family updated at bedside.   ?

## 2021-09-20 NOTE — Progress Notes (Signed)
ANTICOAGULATION CONSULT NOTE ?Pharmacy Consult for heparin ?Indication: atrial fibrillation ? ? ?Allergies  ?Allergen Reactions  ? Triptans Palpitations  ?  Reaction to Maxalt  ? ? ?Patient Measurements: ?Height: '5\' 7"'$  (170.2 cm) ?Weight: 73.5 kg (162 lb 0.6 oz) ?IBW/kg (Calculated) : 66.1 ?Heparin Dosing Weight: 77kg ? ?Vital Signs: ?Temp: 98.1 ?F (36.7 ?C) (05/10 2036) ?Temp Source: Rectal (05/10 2000) ?BP: 98/79 (05/10 1730) ?Pulse Rate: 64 (05/10 2007) ? ?Labs: ?Recent Labs  ?  09/18/21 ?0418 09/18/21 ?1537 09/19/21 ?0441 09/19/21 ?2319 09/19/21 ?2332 09/20/21 ?0150 09/20/21 ?3149 09/20/21 ?1141 09/20/21 ?1828 09/20/21 ?1954  ?HGB 13.3  --  13.2   < > 12.0*  --  11.8*  --  12.9*  --   ?HCT 45.2  --  45.1   < > 41.5  --  39.7  --  38.0*  --   ?PLT 448*  --  402*  --  421*  --  348  --   --   --   ?APTT 108*   < > 90*  --   --   --  35  --   --  58*  ?LABPROT  --   --   --   --  21.9*  --   --   --   --   --   ?INR  --   --   --   --  1.9*  --   --   --   --   --   ?HEPARINUNFRC >1.10*  --  >1.10*  --   --   --  >1.10*  --   --   --   ?CREATININE 1.88*   < > 1.70*  --  2.75*  --  3.09*  --   --   --   ?TROPONINIHS  --   --   --   --   --  396* 589* 646*  --   --   ? < > = values in this interval not displayed.  ? ? ? ?Estimated Creatinine Clearance: 25.6 mL/min (A) (by C-G formula based on SCr of 3.09 mg/dL (H)). ? ?Assessment: ?56 y.o. male with hx AF on apixaban PTA. Pt transitioned to IV heparin with possible need for procedures. Pt s/p PEA arrest earlier this am 5/10. ? ?aPTT subtherapeutic (58 sec) on gtt at 750 units/hr. No issues with line or bleeding reported per RN. ? ?Goal of Therapy:  ?Heparin level 0.3-0.7 units/ml ?aPTT 66-102 seconds ?Monitor platelets by anticoagulation protocol: Yes ?  ?Plan:  ?Increase heparin to 900 units/h ?Recheck aPTT/heparin level in a.m. (~8h post rate change) ? ?Sherlon Handing, PharmD, BCPS ?Please see amion for complete clinical pharmacist phone list ?09/20/2021 ? ? ?

## 2021-09-20 NOTE — Plan of Care (Addendum)
Roy Dyer had a PEA arrest with ROSC achieved after ~10 minutes, given epinephrine, bicarb, calcium empirically. Labs following event show WBC 23 from 16, pH 7.32, no significnat electrolyte dyscrasias, mixed venous 94% (known shunting). POCUS TTE with trivial pericardial effusion, LVEF ~35%, severely depressed RV function w/ RV dilation, normal valvular function. EKG with an idioventricular rhythm in the 80s-90s with a RBBB morphology (previously had NSR with a LBBB) ?Unclear cause of code event but favor predominantly septic/distributive shock. The idioventricular rhythm is catecholamine-related, probably not ischemic. Vasopressors to support BP including levophed and vasopressin ?Use transduced CVP of IJ to guide fluid administration, probably a little dry ?Continue empiric ABX ?We will continue to follow ?

## 2021-09-20 NOTE — Progress Notes (Signed)
LTM EEG discontinued - no skin breakdown at unhook.   

## 2021-09-20 NOTE — Procedures (Signed)
Arterial Catheter Insertion Procedure Note ? ?[name]  ?[id number]  ?09/19/1965 ? ?Date:09/20/21  ?Time:1:24 AM  ? ? ?Provider Performing: Collier Bullock  ? ? ?Procedure: Insertion of Arterial Line [phone]) with US guidance (37482)  ? ?Indication(s) ?Blood pressure monitoring and/or need for frequent ABGs ? ?Consent ?Unable to obtain consent due to emergent nature of procedure. ? ?Anesthesia ?None ? ? ?Time Out ?Verified patient identification, verified procedure, site/side was marked, verified correct patient position, special equipment/implants available, medications/allergies/relevant history reviewed, required imaging and test results available. ? ? ?Sterile Technique ?Maximal sterile technique including full sterile barrier drape, hand hygiene, sterile gown, sterile gloves, mask, hair covering, sterile ultrasound probe cover (if used). ? ? ?Procedure Description ?Area of catheter insertion was cleaned with chlorhexidine and draped in sterile fashion. With real-time ultrasound guidance an arterial catheter was placed into the left femoral artery.  Appropriate arterial tracings confirmed on monitor.   ? ? ?Complications/Tolerance ?None; patient tolerated the procedure well. ? ? ?EBL ?Minimal ? ? ?Specimen(s) ?None ? ?

## 2021-09-20 NOTE — Progress Notes (Signed)
eLink Physician-Brief Progress Note ?Patient Name: Roy Dyer ?DOB: 1965-06-28 ?MRN: [id number] ? ? ?Date of Service ? 09/20/2021  ?HPI/Events of Note ? Patient is s/p Code Blue with coma, he is having abnormal eye movements concerning for seizures.  ?eICU Interventions ? Keppra load, Neurology consultation ( I spoke with on call neurologist ).  ? ? ? ?  ? ?Frederik Pear ?09/20/2021, 5:08 AM ?

## 2021-09-20 NOTE — Progress Notes (Signed)
?  Echocardiogram ?2D Echocardiogram has been performed. ? Roy Dyer ?09/20/2021, 8:18 AM ?

## 2021-09-20 NOTE — Progress Notes (Addendum)
? ? Advanced Heart Failure Rounding Note ? ?PCP-Cardiologist: [name], MD  ? ?Subjective:   ? ?05/06 Started CRRT ?05/09 CRRT stopped. Limited echo EF 25-30% with severe RV dysfunction small pericardial effusion. Worsening O2 needs. ? ? ? ?Overnight became hypotensive started on NE and subsequently suffered PEA arrest. Achieved ROSC after ~ 10 min. Emergently intubated. Concern for seizure activity and unresponsive post arrest (no sedation) - Neuro consulted. ? ?POCUS TTE at time of event LVEF 35%, severely decreased RV ? ?On meropenem, vanc and azithromycin for PNA. Bronch with BAL this am. ? ?CO-OX 95% inaccurate, recheck ? ?Currently on 1 DBA, NE and Vaso off. ? ?CVP 3 but ? waveform ? ?Intubated, FiO2 90% ? ?MAPs 80s ? ?Not following commands off sedation. ? ? ? ? ?Echo this am:  ?EF 25-30%, RV severely reduced, moderate to severe TR, small pericardial effusion ? ? ?Objective:   ?Weight Range: ?73.5 kg ?Body mass index is 25.38 kg/m?.  ? ?Vital Signs:   ?Temp:  [97.9 ?F (36.6 ?C)-98.6 ?F (37 ?C)] 98.6 ?F (37 ?C) (05/10 0730) ?Pulse Rate:  [82-261] 88 (05/10 0700) ?Resp:  [12-40] 19 (05/10 0700) ?BP: (59-171)/(44-93) 114/75 (05/10 0700) ?SpO2:  [52 %-100 %] 100 % (05/10 0700) ?Arterial Line BP: (123-155)/(57-76) 126/62 (05/10 0700) ?FiO2 (%):  [60 %-100 %] 90 % (05/10 0314) ?Last BM Date : 09/27/2021 ? ?Weight change: ?Filed Weights  ? 09/17/21 0600 09/18/21 0500 09/19/21 0557  ?Weight: 77.3 kg 72.7 kg 73.5 kg  ? ? ?Intake/Output:  ? ?Intake/Output Summary (Last 24 hours) at 09/20/2021 5427 ?Last data filed at 09/20/2021 0200 ?Gross per 24 hour  ?Intake 1460.39 ml  ?Output 200 ml  ?Net 1260.39 ml  ?  ? ? ?Physical Exam  ? ?General:  Critically ill. Intubated.  ?HEENT: + ETT ?Neck: supple. no JVD. Carotids 2+ bilat; no bruits. R IJ HD cath ?Cor: PMI nondisplaced. Regular rate & rhythm. No rubs, gallops or murmurs. ?Lungs: coarse througohut ?Abdomen: soft, nontender, nondistended. No hepatosplenomegaly.   ?Extremities: no cyanosis, clubbing, rash, edema, LUE AVF ?Neuro: Not following commands off sedation. EEG set up. ? ? ?Telemetry  ?  ?SR 80s-90s ? ?Labs  ?  ?CBC ?Recent Labs  ?  09/19/21 ?2332 09/20/21 ?0623  ?WBC 23.2* 21.8*  ?HGB 12.0* 11.8*  ?HCT 41.5 39.7  ?MCV 80.0 78.1*  ?PLT 421* 348  ? ?Basic Metabolic Panel ?Recent Labs  ?  09/19/21 ?0441 09/19/21 ?2319 09/19/21 ?2332 09/20/21 ?7628  ?NA 136   < > 135 134*  ?K 4.1   < > 4.7 4.6  ?CL 100  --  100 102  ?CO2 27  --  18* 21*  ?GLUCOSE 106*  --  162* 228*  ?BUN 18  --  30* 41*  ?CREATININE 1.70*  --  2.75* 3.09*  ?CALCIUM 8.9  --  10.1 9.0  ?MG 2.8*  --   --  2.8*  ?PHOS 2.4*  --   --  5.7*  ? < > = values in this interval not displayed.  ? ?Liver Function Tests ?Recent Labs  ?  09/19/21 ?2332 09/20/21 ?3151  ?AST 88*  --   ?ALT 36  --   ?ALKPHOS 191*  --   ?BILITOT 2.2*  --   ?PROT 4.9*  --   ?ALBUMIN 2.1* 2.2*  ? ?No results for input(s): LIPASE, AMYLASE in the last 72 hours. ?Cardiac Enzymes ?No results for input(s): CKTOTAL, CKMB, CKMBINDEX, TROPONINI in the last 72 hours. ? ? ?  BNP: ?BNP (last 3 results) ?Recent Labs  ?  05/02/21 ?1234 10/04/2021 ?1617 09/14/21 ?0308  ?BNP 1,152.1* 1,553.9* 1,395.1*  ? ? ?ProBNP (last 3 results) ?No results for input(s): PROBNP in the last 8760 hours. ? ? ?D-Dimer ?No results for input(s): DDIMER in the last 72 hours. ?Hemoglobin A1C ?No results for input(s): HGBA1C in the last 72 hours. ?Fasting Lipid Panel ?No results for input(s): CHOL, HDL, LDLCALC, TRIG, CHOLHDL, LDLDIRECT in the last 72 hours. ?Thyroid Function Tests ?Recent Labs  ?  09/19/21 ?2332  ?TSH 10.826*  ? ? ? ?Other results: ? ? ?Imaging  ? ? ?CT HEAD WO CONTRAST (5MM) ? ?Result Date: 09/20/2021 ?CLINICAL DATA:  Mental status change with unknown cause EXAM: CT HEAD WITHOUT CONTRAST TECHNIQUE: Contiguous axial images were obtained from the base of the skull through the vertex without intravenous contrast. RADIATION DOSE REDUCTION: This exam was performed  according to the departmental dose-optimization program which includes automated exposure control, adjustment of the mA and/or kV according to patient size and/or use of iterative reconstruction technique. COMPARISON:  05/17/2017 FINDINGS: Brain: No evidence of acute infarction, hemorrhage, hydrocephalus, extra-axial collection or mass lesion/mass effect. Vascular: No hyperdense vessel or unexpected calcification. Skull: Normal. Negative for fracture or focal lesion. Sinuses/Orbits: No acute finding. IMPRESSION: Negative head CT. Electronically Signed   By: Jorje Guild M.D.   On: 09/20/2021 06:20  ? ?CT CHEST WO CONTRAST ? ?Result Date: 09/19/2021 ?CLINICAL DATA:  Pneumonia, complication suspected EXAM: CT CHEST WITHOUT CONTRAST TECHNIQUE: Multidetector CT imaging of the chest was performed following the standard protocol without IV contrast. RADIATION DOSE REDUCTION: This exam was performed according to the departmental dose-optimization program which includes automated exposure control, adjustment of the mA and/or kV according to patient size and/or use of iterative reconstruction technique. COMPARISON:  Chest radiograph 09/18/2021 FINDINGS: Cardiovascular: ICD lead RIGHT ventricle via RIGHT subclavian approach. Atherosclerotic calcifications aorta, coronary arteries and proximal great vessels. Aorta normal caliber. Enlargement of cardiac chambers diffusely. Small pericardial effusion. Mediastinum/Nodes: Esophagus unremarkable. Base of cervical region normal appearance. Few normal size mediastinal lymph nodes. No definite adenopathy, though hilar assessment is limited by lack of IV contrast. Lungs/Pleura: Extensive BILATERAL airspace infiltrates which could represent multifocal pneumonia or less likely asymmetric edema slightly greater on RIGHT. No pleural effusion or pneumothorax. No discrete mass identified. No pneumothorax. Upper Abdomen: Assessment of upper abdomen degraded by motion artifacts. Significant  renal cortical atrophy and probable renal cysts, inadequately assessed, patient post renal transplant by history. Musculoskeletal: No acute osseous findings. IMPRESSION: Extensive BILATERAL airspace infiltrates which could represent multifocal pneumonia or less likely asymmetric edema. Enlargement of cardiac chambers with small pericardial effusion. Extensive atherosclerotic disease changes including coronary arteries. Significant renal cortical atrophy and probable renal cysts, inadequately assessed due to motion. Aortic Atherosclerosis (ICD10-I70.0). Electronically Signed   By: Lavonia Dana M.D.   On: 09/19/2021 12:31  ? ?DG CHEST PORT 1 VIEW ? ?Result Date: 09/19/2021 ?CLINICAL DATA:  Respiratory failure EXAM: PORTABLE CHEST 1 VIEW COMPARISON:  09/18/2021 FINDINGS: Endotracheal tube is seen 5.6 cm above the carina. Nasogastric tube extends in the upper abdomen beyond the margin of the examination. Right internal jugular hemodialysis catheter tip noted within the superior vena cava. Right subclavian single lead pacemaker defibrillator is unchanged overlying the right ventricle. Diffuse ground-glass pulmonary infiltrate is again seen, stable since prior examination, in keeping with alveolar pulmonary edema, diffuse pneumonic infiltrate, or ARDS. No pneumothorax or pleural effusion. Moderate cardiomegaly is stable. IMPRESSION: Stable support lines and  tubes. Stable moderate cardiomegaly. Stable diffuse pulmonary infiltrate Electronically Signed   By: Fidela Salisbury M.D.   On: 09/19/2021 23:49  ? ?ECHOCARDIOGRAM LIMITED ? ?Result Date: 09/19/2021 ?   ECHOCARDIOGRAM LIMITED REPORT   Patient Name:   Roy Dyer Date of Exam: 09/19/2021 Medical Rec #:  [id number]    Height:       67.0 in Accession #:    [id number]   Weight:       162.0 lb Date of Birth:  09-18-1965    BSA:          1.849 m? Patient Age:    56 years     BP:           102/66 mmHg Patient Gender: M            HR:           93 bpm. Exam Location:  Inpatient  Procedure: Limited Echo and Color Doppler Indications:    Congestive Heart Failure                 Pericardial effusion  History:        Patient has prior history of Echocardiogram examinations, most                 recen

## 2021-09-20 NOTE — Progress Notes (Signed)
eLink Physician-Brief Progress Note ?Patient Name: Roy Dyer ?DOB: Jun 03, 1965 ?MRN: [id number] ? ? ?Date of Service ? 09/20/2021  ?HPI/Events of Note ? Family at bedside, wishes to change code status  ?eICU Interventions ? Spoke with wife, Daison Braxton, over the camera. She is aware of the overall poor prognosis and stated that she wishes to change code status from FULL to DNR.  ?Clarified what DNR would entail, and wife understood. All questions/concerns addressed.  ? ?Changed Code Status to DNR.   ? ? ? ?  ? ?[place] ?09/20/2021, 8:10 PM ?

## 2021-09-20 NOTE — Progress Notes (Signed)
Patient ID: Roy Dyer, male   DOB: [birth year], 56 y.o.   MRN: [id number] ?[facility] ?Progress Note  ? ?Assessment/ Plan:   ?1. 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  Presented with acute kidney injury that was suspected to be from CHF decompensation however, continued to worsen and was refractory to diuretics.  He was transferred to the ICU for CRRT which was initiated on 5/6.  Stopped CRRT on 5/9 am as patient euvolemic to dry ?- no emergent indication to resume this am but anticipate he will need in the next 24 hours.  Will discuss timing with critical care ?- has RIJ nontunneled catheter as well as AVF    ? ?2. Septic shock ?- abx and pressors per primary team  ?- cellcept reduced as below  ? ?3.  Acute exacerbation of congestive heart failure: Right heart catheterization with elevated biventricular pressures with high cardiac output and severe mixed pulmonary hypertension.  Started on CRRT for extracorporeal volume unloading after refractory to diuretics.  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 ?4. PEA arrest ?- post arrest care per pulm ?- setting of septic shock ? ?5. Acute hypoxic respiratory failure  ?- optimize volume status with CRRT/HD as needed ?- supportive care per pulm ? ?6. CKD stage III  ?-baseline Cr around 1.7 however he states closer to 2 more recently ? ?7.  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  ?Rechecking tacrolimus level - not really possible for lower envarsus dose per pharmacy.  Pt is now s/p CRRT as well and may be in range  with change in clearance.  ?- No proteinuria or hematuria and AKI felt pre-renal as he is s/p aggressive diuresis prior to trial of CRRT.  Defer allograft biopsy at this time ? ?8.  Immunosuppression   ?- Continue maintenance immunosuppressive therapy with Envarsus   ?- reduce cellcept to 250 mg BID in the setting of septic shock  ?- Patient is now on solumedrol  ? ?9. Atrial flutter ?- noted. Per primary team  ? ?10.  Hyponatremia: Secondary to CHF exacerbation and acute kidney injury, improving with volume unloading/CRRT. ? ?11. Hyperphos - setting of AKI; hold phoslo as we are anticipating CRRT again ? ?12. 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 ?Disposition - continue monitoring in ICU ? ?Subjective:   ?He had a PEA arrest overnight.  Received CPR and epi x 4. He was intubated during the code.  Neurology was consulted as he was unresponsive after the arrest despite lack of sedation. RN at bedside clarifies that he had no seizure activity; there is EEG monitoring being set up currently.  He was started on levo which is now off.  Still on vasopressin and on dobutamine.  Nursing is going to get bladder scan  ? ?Stopped CRRT on 5/9.  He had 253 ml UF with CRRT before stopping after discussion with CHF.  He had a CT suggestive of multifocal PNA and I spoke with pulm  - they were starting abx.  No UOP is charted for 5/9.  ? ?Review of  systems:   ?Unable to obtain given intubated  ? ?Objective:   ?BP 136/87   Pulse 98   Temp 97.9 ?F (36.6 ?C) (Oral)   Resp (!) 24   Ht '5\' 7"'$  (1.702 m)   Wt 73.5 kg   SpO2 100%   BMI 25.38 kg/m?  ? ?Intake/Output Summary (Last 24 hours) at 09/20/2021 0608 ?Last data filed at 09/20/2021 0200 ?Gross per 24 hour  ?Intake 1783.99 ml  ?Output 261 ml  ?Net 1522.99 ml  ? ?Weight change:  ? ?Physical Exam:     ?Gen: adult male in bed critically ill  ?CVS: S1S2 do not appreciate Pericardial rub.  Current CVP is 6 per RN ?Resp: coarse mechanical breath sound; FIO2 90 and PEEP 10. ?Abd: Soft,  flat, nontender ?Ext: no lower extremity edema appreciated ?Neuro- does not follow commands; no sedation running; EEG is being set up ?Psych unable to assess ?GU no foley - has purewick and they will bladder scan  ?Access RIJ nontunn HD catheter; bruit and thrill LUE AVF ? ? ?Imaging: ?CT CHEST WO CONTRAST ? ?Result Date: 09/19/2021 ?CLINICAL DATA:  Pneumonia, complication suspected EXAM: CT CHEST WITHOUT CONTRAST TECHNIQUE: Multidetector CT imaging of the chest was performed following the standard protocol without IV contrast. RADIATION DOSE REDUCTION: This exam was performed according to the departmental dose-optimization program which includes automated exposure control, adjustment of the mA and/or kV according to patient size and/or use of iterative reconstruction technique. COMPARISON:  Chest radiograph 09/18/2021 FINDINGS: Cardiovascular: ICD lead RIGHT ventricle via RIGHT subclavian approach. Atherosclerotic calcifications aorta, coronary arteries and proximal great vessels. Aorta normal caliber. Enlargement of cardiac chambers diffusely. Small pericardial effusion. Mediastinum/Nodes: Esophagus unremarkable. Base of cervical region normal appearance. Few normal size mediastinal lymph nodes. No definite adenopathy, though hilar assessment is limited by lack of IV contrast. Lungs/Pleura: Extensive BILATERAL airspace infiltrates which could represent multifocal pneumonia or less likely asymmetric edema slightly greater on RIGHT. No pleural effusion or pneumothorax. No discrete mass identified. No pneumothorax. Upper Abdomen: Assessment of upper abdomen degraded by motion artifacts. Significant renal cortical atrophy and probable renal cysts, inadequately assessed, patient post renal transplant by history. Musculoskeletal: No acute osseous findings. IMPRESSION: Extensive BILATERAL airspace infiltrates which could represent multifocal pneumonia or less likely asymmetric edema. Enlargement of cardiac chambers with  small pericardial effusion. Extensive atherosclerotic disease changes including coronary arteries. Significant renal cortical atrophy and probable renal cysts, inadequately assessed due to motion. Aortic Atherosclerosis (ICD10-I70.0). Electronically Signed   By: Lavonia Dana M.D.   On: 09/19/2021 12:31  ? ?DG CHEST PORT 1 VIEW ? ?Result Date: 09/19/2021 ?CLINICAL DATA:  Respiratory failure EXAM: PORTABLE CHEST 1 VIEW COMPARISON:  09/18/2021 FINDINGS: Endotracheal tube is seen 5.6 cm above the carina. Nasogastric tube extends in the upper abdomen beyond the margin of the examination. Right internal jugular hemodialysis catheter tip noted within the superior vena cava. Right subclavian single lead pacemaker defibrillator is unchanged overlying the right ventricle. Diffuse ground-glass pulmonary infiltrate is again seen, stable since prior examination, in keeping with alveolar pulmonary edema, diffuse pneumonic infiltrate, or ARDS. No pneumothorax or pleural effusion. Moderate cardiomegaly is stable. IMPRESSION: Stable support lines and tubes. Stable moderate cardiomegaly. Stable diffuse pulmonary infiltrate Electronically Signed   By: Fidela Salisbury M.D.   On: 09/19/2021 23:49  ? ?ECHOCARDIOGRAM LIMITED ? ?Result Date: 09/19/2021 ?   ECHOCARDIOGRAM LIMITED REPORT   Patient Name:   NATANEL SNAVELY Date of Exam: 09/19/2021 Medical Rec #:  [id number]  Height:       67.0 in Accession #:    [id number]   Weight:       162.0 lb Date of Birth:  26-Aug-1965    BSA:          1.849 m? Patient Age:    74 years     BP:           102/66 mmHg Patient Gender: M            HR:           93 bpm. Exam Location:  Inpatient Procedure: Limited Echo and Color Doppler Indications:    Congestive Heart Failure                 Pericardial effusion  History:        Patient has prior history of Echocardiogram examinations, most                 recent 09/12/2021. CHF, Pacemaker, Arrythmias:Atrial Flutter; Risk                 Factors:Hypertension and HLD.   Sonographer:    Joette Catching RCS Referring Phys: [phone] LINDSAY NICOLE Haledon  1. Pericardial effusion appears small on this study. No signs of tamponade.  2. Left ventricular ejection fraction, by esti

## 2021-09-20 NOTE — Progress Notes (Signed)
At around 2150 Pt called to use the bedpan. Bp reading at 2200 71/60(65) and 59/49 at 2215. Levophed started  before Pt was cleaned up. Pt was diaphoretic and endorsed not feeling well, no chest pain but labored breathing. Pt mentioned "I feel like I am going to die" and went unresponsive. Rhythm was PEA in the low 100s. Code blue called and cpr started. Family called to bedside. 15 minutes to regain rosc. ?

## 2021-09-20 NOTE — Progress Notes (Signed)
ANTICOAGULATION CONSULT NOTE ?Pharmacy Consult for heparin ?Indication: atrial fibrillation ? ? ?Allergies  ?Allergen Reactions  ? Triptans Palpitations  ?  Reaction to Maxalt  ? ? ?Patient Measurements: ?Height: '5\' 7"'$  (170.2 cm) ?Weight: 73.5 kg (162 lb 0.6 oz) ?IBW/kg (Calculated) : 66.1 ?Heparin Dosing Weight: 77kg ? ?Vital Signs: ?Temp: 98.6 ?F (37 ?C) (05/10 0730) ?Temp Source: Axillary (05/10 0730) ?BP: 114/75 (05/10 0700) ?Pulse Rate: 88 (05/10 0700) ? ?Labs: ?Recent Labs  ?  09/18/21 ?0418 09/18/21 ?1537 09/18/21 ?2004 09/19/21 ?0441 09/19/21 ?2319 09/19/21 ?2332 09/20/21 ?0150 09/20/21 ?1610  ?HGB 13.3  --   --  13.2 13.9 12.0*  --  11.8*  ?HCT 45.2  --   --  45.1 41.0 41.5  --  39.7  ?PLT 448*  --   --  402*  --  421*  --  348  ?APTT 108*  --  109* 90*  --   --   --  35  ?LABPROT  --   --   --   --   --  21.9*  --   --   ?INR  --   --   --   --   --  1.9*  --   --   ?HEPARINUNFRC >1.10*  --   --  >1.10*  --   --   --  >1.10*  ?CREATININE 1.88*   < >  --  1.70*  --  2.75*  --  3.09*  ?TROPONINIHS  --   --   --   --   --   --  396* 589*  ? < > = values in this interval not displayed.  ? ? ? ?Estimated Creatinine Clearance: 25.6 mL/min (A) (by C-G formula based on SCr of 3.09 mg/dL (H)). ? ?Assessment: ?56 y.o. male with hx AF on apixaban PTA. Pt transitioned to IV heparin with possible need for procedures. Pt s/p PEA arrest earlier this am 5/10. ? ?Heparin level remains >1.1, aPTT is subtherapeutic, heparin infusing ok per RN with mild oral bleeding following intubation. CBC is stable. ? ?Goal of Therapy:  ?Heparin level 0.3-0.7 units/ml ?aPTT 66-102 seconds ?Monitor platelets by anticoagulation protocol: Yes ?  ?Plan:  ?Increase heparin to 750 units/h ?Recheck aPTT in 8h ? ?Arrie Senate, PharmD, BCPS, BCCP ?Clinical Pharmacist ?[phone] ?Please check AMION for all [facility] numbers ?09/20/2021 ? ? ?

## 2021-09-20 NOTE — Progress Notes (Signed)
LTM EEG hooked up and running - no initial skin breakdown - push button tested - neuro notified. [facility] monitoring.  

## 2021-09-20 NOTE — Progress Notes (Signed)
See full consult note from Dr. Lorrin Goodell this AM. CT head NAICP. EEG shows burst suppression, no seizures. Will continue to follow. ? ?Su Monks, MD ?[facility] ?[phone] ? ?If 7pm- 7am, please page neurology on call as listed in [facility]. ? ?

## 2021-09-20 NOTE — Progress Notes (Signed)
Transported pt in [facility] rm 18 to and from CT. No complications noted.  ?

## 2021-09-20 NOTE — Progress Notes (Signed)
Notified Dr. Tamala Julian re: bilateral feet and toes becoming cyanotic; cool and clammy; +2 pulses; Capillary refill >3sec.  No new orders at this time. ?

## 2021-09-20 NOTE — Procedures (Signed)
Patient Name: Roy Dyer  ?MRN: [id number]  ?Epilepsy Attending: Lora Havens  ?Referring Physician/Provider: Donnetta Simpers, MD ?Date: 09/20/2021 ?Duration: 29.34 mins ? ?Patient history: 56 year old male status post cardiac arrest.  EEG to evaluate for seizure. ? ?Level of alertness: comatose ? ?AEDs during EEG study: Clonazepam ? ?Technical aspects: This EEG study was done with scalp electrodes positioned according to the 10-20 International system of electrode placement. Electrical activity was acquired at a sampling rate of '500Hz'$  and reviewed with a high frequency filter of '70Hz'$  and a low frequency filter of '1Hz'$ . EEG data were recorded continuously and digitally stored.  ? ?Description: EEG showed burst suppression pattern with bursts of 3 to 5 Hz theta-delta slowing lasting 2 to 3 seconds alternating with 2 to 3 seconds of generalized EEG suppression. Hyperventilation and photic stimulation were not performed.    ? ?ABNORMALITY ?-Burst suppression, generalized ? ?IMPRESSION: ?This study is suggestive of severe to profound diffuse encephalopathy, nonspecific to etiology.  No seizures or epileptiform discharges were seen throughout the recording. ? ?Lora Havens  ? ?

## 2021-09-20 NOTE — Procedures (Signed)
Intubation Procedure Note ? ?[name]  ?[id number]  ?1965/12/19 ? ?Date:09/20/21  ?Time:1:27 AM  ? ?Provider Performing:Rondo Spittler Duwayne Heck  ? ? ?Procedure: Intubation (44010) ? ?Indication(s) ?Respiratory Failure ? ?Consent ?Unable to obtain consent due to emergent nature of procedure. ? ? ?Anesthesia ?none ? ? ?Time Out ?Verified patient identification, verified procedure, site/side was marked, verified correct patient position, special equipment/implants available, medications/allergies/relevant history reviewed, required imaging and test results available. ? ? ?Sterile Technique ?Usual hand hygeine, masks, and gloves were used ? ? ?Procedure Description ?Patient positioned in bed supine.  Sedation given as noted above.  Patient was intubated with endotracheal tube using Glidescope.  View was Grade 1 full glottis .  Number of attempts was 1.  Colorimetric CO2 detector was consistent with tracheal placement. ? ? ?Complications/Tolerance ?None; patient tolerated the procedure well. ?Chest X-ray is ordered to verify placement. ? ? ?EBL ?None ? ? ?Specimen(s) ?None ? ?

## 2021-09-20 NOTE — Procedures (Signed)
Cortrak ? ?Tube Type:  Cortrak - 43 inches ?Tube Location:  Right nare ?Initial Placement:  Stomach ?Secured by: Bridle ?Technique Used to Measure Tube Placement:  Marking at nare/corner of mouth ?Cortrak Secured At:  70 cm ? ?Cortrak Tube Team Note: ? ?Consult received to place a Cortrak feeding tube.  ? ?X-ray is required, abdominal x-ray has been ordered by the Cortrak team. Please confirm tube placement before using the Cortrak tube.  ? ?If the tube becomes dislodged please keep the tube and contact the Cortrak team at [email or web address] (password [email or web address]) for replacement.  ?If after hours and replacement cannot be delayed, place a NG tube and confirm placement with an abdominal x-ray.  ? ? ?Breianna Delfino MS, RD, LDN ?Please refer to AMION for RD and/or RD on-call/weekend/after hours pager ? ? ?

## 2021-09-21 ENCOUNTER — Inpatient Hospital Stay (HOSPITAL_COMMUNITY): Payer: Managed Care, Other (non HMO)

## 2021-09-21 DIAGNOSIS — I5043 Acute on chronic combined systolic (congestive) and diastolic (congestive) heart failure: Secondary | ICD-10-CM | POA: Diagnosis not present

## 2021-09-21 DIAGNOSIS — N189 Chronic kidney disease, unspecified: Secondary | ICD-10-CM | POA: Diagnosis not present

## 2021-09-21 DIAGNOSIS — N179 Acute kidney failure, unspecified: Secondary | ICD-10-CM | POA: Diagnosis not present

## 2021-09-21 LAB — CBC WITH DIFFERENTIAL/PLATELET
Abs Immature Granulocytes: 0.87 10*3/uL — ABNORMAL HIGH (ref 0.00–0.07)
Basophils Absolute: 0 10*3/uL (ref 0.0–0.1)
Basophils Relative: 0 %
Eosinophils Absolute: 0 10*3/uL (ref 0.0–0.5)
Eosinophils Relative: 0 %
HCT: 36.2 % — ABNORMAL LOW (ref 39.0–52.0)
Hemoglobin: 10.9 g/dL — ABNORMAL LOW (ref 13.0–17.0)
Immature Granulocytes: 4 %
Lymphocytes Relative: 2 %
Lymphs Abs: 0.4 10*3/uL — ABNORMAL LOW (ref 0.7–4.0)
MCH: 23.3 pg — ABNORMAL LOW (ref 26.0–34.0)
MCHC: 30.1 g/dL (ref 30.0–36.0)
MCV: 77.5 fL — ABNORMAL LOW (ref 80.0–100.0)
Monocytes Absolute: 1.1 10*3/uL — ABNORMAL HIGH (ref 0.1–1.0)
Monocytes Relative: 5 %
Neutro Abs: 21.6 10*3/uL — ABNORMAL HIGH (ref 1.7–7.7)
Neutrophils Relative %: 89 %
Platelets: 365 10*3/uL (ref 150–400)
RBC: 4.67 MIL/uL (ref 4.22–5.81)
RDW: 19 % — ABNORMAL HIGH (ref 11.5–15.5)
WBC: 24 10*3/uL — ABNORMAL HIGH (ref 4.0–10.5)
nRBC: 0 % (ref 0.0–0.2)

## 2021-09-21 LAB — CBC
HCT: 39.9 % (ref 39.0–52.0)
Hemoglobin: 11.8 g/dL — ABNORMAL LOW (ref 13.0–17.0)
MCH: 22.9 pg — ABNORMAL LOW (ref 26.0–34.0)
MCHC: 29.6 g/dL — ABNORMAL LOW (ref 30.0–36.0)
MCV: 77.3 fL — ABNORMAL LOW (ref 80.0–100.0)
Platelets: 435 10*3/uL — ABNORMAL HIGH (ref 150–400)
RBC: 5.16 MIL/uL (ref 4.22–5.81)
RDW: 19.7 % — ABNORMAL HIGH (ref 11.5–15.5)
WBC: 25 10*3/uL — ABNORMAL HIGH (ref 4.0–10.5)
nRBC: 0 % (ref 0.0–0.2)

## 2021-09-21 LAB — RENAL FUNCTION PANEL
Albumin: 2.3 g/dL — ABNORMAL LOW (ref 3.5–5.0)
Albumin: 2.1 g/dL — ABNORMAL LOW (ref 3.5–5.0)
Anion gap: 15 (ref 5–15)
Anion gap: 14 (ref 5–15)
BUN: 74 mg/dL — ABNORMAL HIGH (ref 6–20)
BUN: 85 mg/dL — ABNORMAL HIGH (ref 6–20)
CO2: 19 mmol/L — ABNORMAL LOW (ref 22–32)
CO2: 20 mmol/L — ABNORMAL LOW (ref 22–32)
Calcium: 8.6 mg/dL — ABNORMAL LOW (ref 8.9–10.3)
Calcium: 8.2 mg/dL — ABNORMAL LOW (ref 8.9–10.3)
Chloride: 101 mmol/L (ref 98–111)
Chloride: 102 mmol/L (ref 98–111)
Creatinine, Ser: 3.93 mg/dL — ABNORMAL HIGH (ref 0.61–1.24)
Creatinine, Ser: 4.22 mg/dL — ABNORMAL HIGH (ref 0.61–1.24)
GFR, Estimated: 17 mL/min — ABNORMAL LOW (ref >60)
GFR, Estimated: 16 mL/min — ABNORMAL LOW (ref >60)
Glucose, Bld: 276 mg/dL — ABNORMAL HIGH (ref 70–99)
Glucose, Bld: 255 mg/dL — ABNORMAL HIGH (ref 70–99)
Phosphorus: 7.1 mg/dL — ABNORMAL HIGH (ref 2.5–4.6)
Phosphorus: 7.3 mg/dL — ABNORMAL HIGH (ref 2.5–4.6)
Potassium: 4.6 mmol/L (ref 3.5–5.1)
Potassium: 4.1 mmol/L (ref 3.5–5.1)
Sodium: 135 mmol/L (ref 135–145)
Sodium: 136 mmol/L (ref 135–145)

## 2021-09-21 LAB — ANCA PROFILE
Anti-MPO Antibodies: <0.2 units (ref 0.0–0.9)
Anti-PR3 Antibodies: <0.2 units (ref 0.0–0.9)
Atypical P-ANCA titer: <1:20 {titer}
C-ANCA: <1:20 {titer}
P-ANCA: <1:20 {titer}

## 2021-09-21 LAB — CYCLIC CITRUL PEPTIDE ANTIBODY, IGG/IGA: CCP Antibodies IgG/IgA: 5 units (ref 0–19)

## 2021-09-21 LAB — GLUCOSE, CAPILLARY
Glucose-Capillary: 175 mg/dL — ABNORMAL HIGH (ref 70–99)
Glucose-Capillary: 205 mg/dL — ABNORMAL HIGH (ref 70–99)
Glucose-Capillary: 238 mg/dL — ABNORMAL HIGH (ref 70–99)
Glucose-Capillary: 239 mg/dL — ABNORMAL HIGH (ref 70–99)
Glucose-Capillary: 276 mg/dL — ABNORMAL HIGH (ref 70–99)
Glucose-Capillary: 289 mg/dL — ABNORMAL HIGH (ref 70–99)
Glucose-Capillary: 351 mg/dL — ABNORMAL HIGH (ref 70–99)

## 2021-09-21 LAB — MAGNESIUM
Magnesium: 3 mg/dL — ABNORMAL HIGH (ref 1.7–2.4)
Magnesium: 2.3 mg/dL (ref 1.7–2.4)

## 2021-09-21 LAB — CALCIUM, IONIZED: Calcium, Ionized, Serum: 5.2 mg/dL (ref 4.5–5.6)

## 2021-09-21 LAB — VANCOMYCIN, RANDOM: Vancomycin Rm: 20

## 2021-09-21 LAB — HEPATITIS B SURFACE ANTIBODY,QUALITATIVE: Hep B S Ab: NONREACTIVE

## 2021-09-21 LAB — COOXEMETRY PANEL
Carboxyhemoglobin: 1.4 % (ref 0.5–1.5)
Methemoglobin: 0.9 % (ref 0.0–1.5)
O2 Saturation: 92.7 %
Total hemoglobin: 11.3 g/dL — ABNORMAL LOW (ref 12.0–16.0)

## 2021-09-21 LAB — PHOSPHORUS
Phosphorus: 6.9 mg/dL — ABNORMAL HIGH (ref 2.5–4.6)
Phosphorus: 3.2 mg/dL (ref 2.5–4.6)

## 2021-09-21 LAB — HEPATITIS B CORE ANTIBODY, TOTAL: Hep B Core Total Ab: NONREACTIVE

## 2021-09-21 LAB — HEPATITIS B SURFACE ANTIGEN
Hepatitis B Surface Ag: NONREACTIVE
Hepatitis B Surface Ag: NONREACTIVE

## 2021-09-21 LAB — PROCALCITONIN: Procalcitonin: 19.54 ng/mL

## 2021-09-21 LAB — TRIGLYCERIDES: Triglycerides: 103 mg/dL (ref <150)

## 2021-09-21 LAB — HEPARIN LEVEL (UNFRACTIONATED): Heparin Unfractionated: >1.1 IU/mL — ABNORMAL HIGH (ref 0.30–0.70)

## 2021-09-21 LAB — APTT
aPTT: 51 seconds — ABNORMAL HIGH (ref 24–36)
aPTT: 102 seconds — ABNORMAL HIGH (ref 24–36)

## 2021-09-21 LAB — TACROLIMUS LEVEL: Tacrolimus (FK506) - LabCorp: 9 ng/mL (ref 2.0–20.0)

## 2021-09-21 MED ORDER — VANCOMYCIN HCL 750 MG/150ML IV SOLN
750.0000 mg | Freq: Once | INTRAVENOUS | Status: AC
  Administered 2021-09-21: 750 mg via INTRAVENOUS
  Filled 2021-09-21: qty 150

## 2021-09-21 MED ORDER — SODIUM CHLORIDE 0.9 % IV SOLN
500.0000 mg | INTRAVENOUS | Status: DC
  Administered 2021-09-21: 500 mg via INTRAVENOUS
  Filled 2021-09-21 (×2): qty 10

## 2021-09-21 MED ORDER — INSULIN ASPART 100 UNIT/ML IJ SOLN
0.0000 [IU] | INTRAMUSCULAR | Status: DC
  Administered 2021-09-21: 11 [IU] via SUBCUTANEOUS
  Administered 2021-09-21: 20 [IU] via SUBCUTANEOUS
  Administered 2021-09-22: 7 [IU] via SUBCUTANEOUS
  Administered 2021-09-22 (×2): 4 [IU] via SUBCUTANEOUS
  Administered 2021-09-22: 7 [IU] via SUBCUTANEOUS

## 2021-09-21 MED ORDER — HEPARIN SODIUM (PORCINE) 1000 UNIT/ML IJ SOLN
INTRAMUSCULAR | Status: AC
  Filled 2021-09-21: qty 4

## 2021-09-21 MED ORDER — CHLORHEXIDINE GLUCONATE CLOTH 2 % EX PADS
6.0000 | MEDICATED_PAD | Freq: Every day | CUTANEOUS | Status: DC
  Administered 2021-09-21 – 2021-09-22 (×2): 6 via TOPICAL

## 2021-09-21 NOTE — Progress Notes (Addendum)
? ? Advanced Heart Failure Rounding Note ? ?PCP-Cardiologist: [name], MD  ? ?Subjective:   ? ?05/06 Started CRRT ?05/09 CRRT stopped. Limited echo EF 25-30% with severe RV dysfunction small pericardial effusion. Worsening O2 needs. ?5/10 Hypotensive started on NE and subsequently suffered PEA arrest. Achieved ROSC after ~ 10 min. Emergently intubated. POCUS TTE at time of event LVEF 35%, severely decreased RV. Concern for seizure activity and unresponsive post arrest (no sedation) - Neuro consulted. ? ? ?Echo 5/10: EF 25-30%, RV severely reduced, moderate to severe TR, small pericardial effusion ? ?EEG - no seizure.  ? ?Now on azithromycin, meropenem, clindamycin,  micafungin, primaqui, and vancomycin. ? ?WBC 25. On solumedrol.  ? ?Anuric. Plan for HD today.  ? ?On NE 1 mcg - CO-OX pending.  ? ?Intubated, FiO2 40% . On propofol 5.  ? ?Objective:   ?Weight Range: ?74.4 kg ?Body mass index is 25.7 kg/m?.  ? ?Vital Signs:   ?Temp:  [95.2 ?F (35.1 ?C)-99.5 ?F (37.5 ?C)] 99.1 ?F (37.3 ?C) (05/11 0500) ?Pulse Rate:  [59-90] 67 (05/11 0700) ?Resp:  [17-25] 19 (05/11 0700) ?BP: (89-115)/(45-79) 98/79 (05/10 1730) ?SpO2:  [98 %-100 %] 100 % (05/11 0700) ?Arterial Line BP: (103-154)/(46-71) 129/64 (05/11 0700) ?FiO2 (%):  [40 %-90 %] 40 % (05/11 0302) ?Weight:  [74.4 kg] 74.4 kg (05/11 0545) ?Last BM Date : 10/06/2021 ? ?Weight change: ?Filed Weights  ? 09/18/21 0500 09/19/21 0557 09/21/21 0545  ?Weight: 72.7 kg 73.5 kg 74.4 kg  ? ? ?Intake/Output:  ? ?Intake/Output Summary (Last 24 hours) at 09/21/2021 0752 ?Last data filed at 09/21/2021 0700 ?Gross per 24 hour  ?Intake 1515.18 ml  ?Output 0 ml  ?Net 1515.18 ml  ?  ? ? ?Physical Exam  ?CVP 6-7  ?General:  Intubated ?HEENT: ETT ?Neck: supple. JVP 6-7 . Carotids 2+ bilat; no bruits. No lymphadenopathy or thryomegaly appreciated. ?Cor: PMI nondisplaced. Regular rate & rhythm. No rubs, gallops or murmurs. ?Lungs: clear ?Abdomen: soft, nontender, nondistended. No  hepatosplenomegaly. No bruits or masses. Good bowel sounds. ?Extremities: no cyanosis, clubbing, rash, edema. LUE AVF  ?Neuro: Intubated. Unresponsive.  ? ? ?Telemetry  ?  ?SR 60-90s  ? ?Labs  ?  ?CBC ?Recent Labs  ?  09/20/21 ?0093 09/20/21 ?1828 09/21/21 ?0500  ?WBC 21.8*  --  25.0*  ?HGB 11.8* 12.9* 11.8*  ?HCT 39.7 38.0* 39.9  ?MCV 78.1*  --  77.3*  ?PLT 348  --  435*  ? ?Basic Metabolic Panel ?Recent Labs  ?  09/20/21 ?8182 09/20/21 ?1828 09/20/21 ?1954 09/21/21 ?0500  ?NA 134* 135  --  135  ?K 4.6 4.7  --  4.6  ?CL 102  --   --  101  ?CO2 21*  --   --  19*  ?GLUCOSE 228*  --   --  276*  ?BUN 41*  --   --  74*  ?CREATININE 3.09*  --   --  3.93*  ?CALCIUM 9.0  --   --  8.6*  ?MG 2.8*  --  3.0* 3.0*  ?PHOS 5.7*  --  6.3* 6.9*  7.1*  ? ?Liver Function Tests ?Recent Labs  ?  09/19/21 ?2332 09/20/21 ?9937 09/21/21 ?0500  ?AST 88*  --   --   ?ALT 36  --   --   ?ALKPHOS 191*  --   --   ?BILITOT 2.2*  --   --   ?PROT 4.9*  --   --   ?ALBUMIN 2.1* 2.2* 2.3*  ? ?  No results for input(s): LIPASE, AMYLASE in the last 72 hours. ?Cardiac Enzymes ?No results for input(s): CKTOTAL, CKMB, CKMBINDEX, TROPONINI in the last 72 hours. ? ? ?BNP: ?BNP (last 3 results) ?Recent Labs  ?  05/02/21 ?1234 10/08/2021 ?1617 09/14/21 ?0308  ?BNP 1,152.1* 1,553.9* 1,395.1*  ? ? ?ProBNP (last 3 results) ?No results for input(s): PROBNP in the last 8760 hours. ? ? ?D-Dimer ?No results for input(s): DDIMER in the last 72 hours. ?Hemoglobin A1C ?No results for input(s): HGBA1C in the last 72 hours. ?Fasting Lipid Panel ?Recent Labs  ?  09/21/21 ?0500  ?TRIG 103  ? ?Thyroid Function Tests ?Recent Labs  ?  09/19/21 ?2332  ?TSH 10.826*  ? ? ? ?Other results: ? ? ?Imaging  ? ? ?DG Abd Portable 1V ? ?Result Date: 09/20/2021 ?CLINICAL DATA:  Feeding tube placement EXAM: PORTABLE ABDOMEN - 1 VIEW COMPARISON:  None Available. FINDINGS: Feeding tube enters the stomach with the tip in the distal body of the stomach. Normal bowel gas pattern. AICD with lead in  the region of the right ventricle. IMPRESSION: Feeding tube tip distal body of stomach.  Normal bowel gas pattern. Electronically Signed   By: Franchot Gallo M.D.   On: 09/20/2021 15:13  ? ?Overnight EEG with video ? ?Result Date: 09/20/2021 ?Lora Havens, MD     09/20/2021  1:05 PM Patient Name: Roy Dyer MRN: [id number] Epilepsy Attending: Lora Havens Referring Physician/Provider: Donnetta Simpers, MD Duration: 09/20/2021 0730 to 1120  Patient history: 56 year old male status post cardiac arrest.  EEG to evaluate for seizure.  Level of alertness: comatose  AEDs during EEG study: Clonazepam  Technical aspects: This EEG study was done with scalp electrodes positioned according to the 10-20 International system of electrode placement. Electrical activity was acquired at a sampling rate of '500Hz'$  and reviewed with a high frequency filter of '70Hz'$  and a low frequency filter of '1Hz'$ . EEG data were recorded continuously and digitally stored.  Description: EEG showed burst suppression pattern with bursts of 3 to 5 Hz theta-delta slowing lasting 2 to 3 seconds alternating with 2 to 3 seconds of generalized EEG suppression. Hyperventilation and photic stimulation were not performed.    ABNORMALITY -Burst suppression, generalized  IMPRESSION: This study is suggestive of severe to profound diffuse encephalopathy, nonspecific to etiology. No seizures or epileptiform discharges were seen throughout the recording.  Priyanka Barbra Sarks   ? ?ECHOCARDIOGRAM LIMITED ? ?Result Date: 09/20/2021 ?   ECHOCARDIOGRAM LIMITED REPORT   Patient Name:   Roy Dyer Date of Exam: 09/20/2021 Medical Rec #:  [id number]    Height: Accession #:    [id number]   Weight: Date of Birth:  1966/01/15    BSA: Patient Age:    55 years     BP:           105/70 mmHg Patient Gender: M            HR:           88 bpm. Exam Location:  Inpatient Procedure: Limited Echo, 3D Echo and Strain Analysis Indications:    Cardiomyopathy-Ischemic I25.5  History:         Patient has prior history of Echocardiogram examinations, most                 recent 09/19/2021. CAD, Defibrillator, Arrythmias:LBBB and Atrial                 Fibrillation; Risk Factors:Sleep Apnea, Hypertension and  Dyslipidemia. End stage renal disease s/p renal transplant.                 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 Ischemic cardiomyopathy.  Sonographer:    Darlina Sicilian RDCS Referring Phys: [id number] Frederik Pear  Sonographer Comments: Echo performed with patient supine and on artificial respirator. IMPRESSIONS  1. Left ventricular ejection fraction, by estimation, is 25 to 30%. Left ventricular ejection fraction by 3D volume is 27 %. The left ventricle has severely decreased function. The left ventricle demonstrates regional wall motion abnormalities (see scoring diagram/findings for description). All apical segments and the LV apex appear akinetic/aneurysmal. The rest of the LV segments are severely hypokinetic. The average left ventricular global longitudinal strain is -7.0 %. The global longitudinal strain is abnormal.  2. Right ventricular systolic function is severely reduced. The right ventricular size is moderately enlarged.  3. The mitral valve is abnormal. Mild to moderate mitral valve regurgitation.  4. Tricuspid valve regurgitation is moderate to severe.  5. The aortic valve is tricuspid. There is mild thickening of the aortic valve. Aortic valve regurgitation is trivial. No aortic stenosis is present.  6. A small pericardial effusion is present. There is no evidence of cardiac tamponade. Comparison(s): Compared to prior limited TTE in 09/19/21, there is no significant change. FINDINGS  Left Ventricle: Left ventricular ejection fraction, by estimation, is 25 to 30%. Left ventricular ejection fraction by 3D volume is 27 %. The left ventricle has severely decreased function. The left ventricle demonstrates  regional wall motion abnormalities. All apical segments and the LV apex appear akinetic/aneurysmal. The rest of the LV segments are severely hypokinetic. The average left ventricular global longitudinal strain is

## 2021-09-21 NOTE — Progress Notes (Signed)
Neurology Progress Note ?[name] ?MR# [id number] ?09/21/2021 ? ? ?S: Patient sedated and intubated and no family at bedside. Dialysis nurse setting up HD at bedside during assessment.  ? ? ?O: ?Current vital signs: ?BP 98/79   Pulse (!) 104   Temp 99.7 ?F (37.6 ?C) (Rectal)   Resp (!) 24   Ht 5' 7" (1.702 m)   Wt 74.4 kg   SpO2 97%   BMI 25.70 kg/m?  ?Vital signs in last 24 hours: ?Temp:  [95.2 ?F (35.1 ?C)-99.7 ?F (37.6 ?C)] 99.7 ?F (37.6 ?C) (05/11 1200) ?Pulse Rate:  [59-104] 104 (05/11 1130) ?Resp:  [16-25] 24 (05/11 1130) ?BP: (95-110)/(61-79) 98/79 (05/10 1730) ?SpO2:  [97 %-100 %] 97 % (05/11 1130) ?Arterial Line BP: (103-141)/(46-71) 135/58 (05/11 1000) ?FiO2 (%):  [40 %-90 %] 50 % (05/11 1130) ?Weight:  [74.4 kg] 74.4 kg (05/11 0545) ?Neuro exam - Sedated on propofol, Fentanyl and Precedex at the time of my exam ?Patient is comatose, pharmacologically sedated and intubated. No arousal noted to pain or voice at this time.  He does not follow any commands. No meaningful mental status exam can be assessed due to his clinical condition. ? ?Cranial Nerves: PERRL - equal 2 mm on both sides and reactive to light  ?EOMI - doll's eyes ?No corneal reflex noted ?Facial asymmetry cannot be assessed due to ET tube in place ?Hearing - not able to be assessed ?Gag plus on advancing the ET tube ? ?Motor: decreased tone all over ?No spontaneous movements - Purposeful or involuntary noted ?No myoclonic jerking noted ?DTR: diffusely decreased ?Toes/fingernail bed- no withdraw to painful stimuli ? ?Sensory: No signs elicited ? ?Gait/Rombergs/Coordination: Not tested at this time due to clinical state  ? ? ?ROS- Unable to obtain detailed review of system due to intubation and profound encephalopathy. ? ? ? Labs ?   ?Component Value Date/Time  ? WBC 25.0 (H) 09/21/2021 0500  ? RBC 5.16 09/21/2021 0500  ? HGB 11.8 (L) 09/21/2021 0500  ? HGB 16.8 04/19/2020 1145  ? HCT 39.9 09/21/2021 0500  ? HCT 48.7 04/19/2020 1145  ?  PLT 435 (H) 09/21/2021 0500  ? PLT 217 04/19/2020 1145  ? MCV 77.3 (L) 09/21/2021 0500  ? MCV 88 04/19/2020 1145  ? MCH 22.9 (L) 09/21/2021 0500  ? MCHC 29.6 (L) 09/21/2021 0500  ? RDW 19.7 (H) 09/21/2021 0500  ? RDW 12.6 04/19/2020 1145  ? LYMPHSABS 0.4 (L) 09/14/2021 0308  ? MONOABS 2.4 (H) 09/14/2021 0308  ? EOSABS 0.0 09/14/2021 0308  ? BASOSABS 0.0 09/14/2021 0308  ? ? ?   ?Component Value Date/Time  ? NA 135 09/21/2021 0500  ? NA 140 11/08/2020 0000  ? NA 140 11/08/2020 0000  ? K 4.6 09/21/2021 0500  ? CL 101 09/21/2021 0500  ? CO2 19 (L) 09/21/2021 0500  ? GLUCOSE 276 (H) 09/21/2021 0500  ? BUN 74 (H) 09/21/2021 0500  ? BUN 16 11/08/2020 0000  ? BUN 16 11/08/2020 0000  ? CREATININE 3.93 (H) 09/21/2021 0500  ? CALCIUM 8.6 (L) 09/21/2021 0500  ? CALCIUM 9.1 04/23/2011 0820  ? PROT 4.9 (L) 09/19/2021 2332  ? ALBUMIN 2.3 (L) 09/21/2021 0500  ? AST 88 (H) 09/19/2021 2332  ? ALT 36 09/19/2021 2332  ? ALKPHOS 191 (H) 09/19/2021 2332  ? BILITOT 2.2 (H) 09/19/2021 2332  ? GFRNONAA 17 (L) 09/21/2021 0500  ? GFRAA 75 04/19/2020 1146  ? ? ?   ?Component Value Date/Time  ? CHOL  129 07/07/2021 0905  ? TRIG 103 09/21/2021 0500  ? HDL 51.70 07/07/2021 0905  ? CHOLHDL 2 07/07/2021 0905  ? VLDL 15.0 07/07/2021 0905  ? [place] 62 07/07/2021 0905  ? ? ?Medications: ?Scheduled Meds: ? atorvastatin  80 mg Per Tube QHS  ? chlorhexidine  15 mL Mouth Rinse BID  ? chlorhexidine gluconate (MEDLINE KIT)  15 mL Mouth Rinse BID  ? Chlorhexidine Gluconate Cloth  6 each Topical Daily  ? Chlorhexidine Gluconate Cloth  6 each Topical Q0600  ? clonazepam  0.5 mg Per Tube QHS  ? clopidogrel  75 mg Per Tube Daily  ? famotidine  20 mg Per Tube QHS  ? feeding supplement (PROSource TF)  45 mL Per Tube TID  ? fluticasone  2 spray Each Nare Daily  ? heparin sodium (porcine)      ? insulin aspart  0-9 Units Subcutaneous Q4H  ? mouth rinse  15 mL Mouth Rinse 10 times per day  ? multivitamin  1 tablet Per Tube QHS  ? pantoprazole sodium  40 mg Per Tube  Daily  ? PARoxetine  20 mg Per Tube QPM  ? primaquine  30 mg Per Tube Daily  ? sodium chloride flush  3 mL Intravenous Q12H  ? sodium chloride flush  3 mL Intravenous Q12H  ? sodium chloride flush  3 mL Intravenous Q12H  ? Thrombi-Pad  1 each Topical Once  ? urea   Topical BID  ? vancomycin variable dose per unstable renal function (pharmacist dosing)   Does not apply See admin instructions  ? ?Continuous Infusions: ? sodium chloride    ? sodium chloride 7.5 mL/hr at 09/16/21 1700  ? azithromycin Stopped (09/20/21 1718)  ? clindamycin (CLEOCIN) IV 900 mg (09/21/21 1018)  ? dexmedetomidine (PRECEDEX) IV infusion 0.8 mcg/kg/hr (09/21/21 1114)  ? DOBUTamine Stopped (09/20/21 5993)  ? feeding supplement (VITAL 1.5 CAL) 40 mL/hr at 09/21/21 0800  ? heparin 1,100 Units/hr (09/21/21 0900)  ? meropenem (MERREM) IV    ? methylPREDNISolone (SOLU-MEDROL) injection 1,000 mg (09/21/21 1250)  ? micafungin [facility]) IV 110 mL/hr at 09/21/21 0900  ? norepinephrine (LEVOPHED) Adult infusion 1 mcg/min (09/21/21 0900)  ? propofol (DIPRIVAN) infusion 30 mcg/kg/min (09/21/21 1126)  ? vancomycin    ? vasopressin Stopped (09/20/21 0730)  ? ?PRN Meds:.sodium chloride, sodium chloride, acetaminophen, albuterol, baclofen, fentaNYL (SUBLIMAZE) injection, HYDROcodone-acetaminophen, melatonin, ondansetron (ZOFRAN) IV, ondansetron **OR** [DISCONTINUED] ondansetron (ZOFRAN) IV, polyethylene glycol, sodium chloride, sodium chloride flush, sodium chloride flush ? ?Imaging ?I have reviewed images in epic and the results pertinent to this consultation are: ?CT Head showed NAICP ?EEG showed burst suppression, no seizures ?MRI brain WO contrast- pending ? ? ?Assessment: Roy Dyer is a 56 y.o. male PMHx significant for CHF with AICD placement, hx of GERD, SLE, ESRD s/p renal transplant, CKD, CAD, Afibb who is admitted to the ICU with acute on chronic biventricular failure, acute hypoxemic respiratory failure 2/2 heart failure and ?pneumonia, AKI on  CKD.  He had a witnessed cardiac arrest by the nurse.  Nurse was in the room and patient reported he was feeling poorly and lost consciousness.  He went pulseless.  10 minutes of CPR with ROSC and was given epinephrine, bicarb and calcium.  He was intubated.  After the arrest, he was noted to be more unresponsive despite not being on any sedation.  There was some questionable concern for seizures due to pupillary abnormalities but RN at the bedside did not notice any shaking or  twitching concerning for seizures per Dr Bartholome Bill report. ? ?Neurology was consulted for cardiac arrest with coma, rule out seizures and now prognostication.  ? ?Today Mr. Mccants was restless and fighting vent per bedside RN after they decreased propofol to do neuro exam this am. RN has not noted any seizures and reports family did not over night. It is of note that Propofol and Precedex were restarted and Fentanyl 100 mcg given shortly before I examined patient.  ? ?Recommendations: ?MRI brain WO contrast to assess for anoxic brain injury ordered for 1000 tomorrow. ?Patient has a [employer] pacemaker, [place] ICD only placed by Dr [name] and discussed with MRI tech and they will check for compatibility.  ? ?Electronically signed by:  ?Parke Poisson, Neurology NP ?Can be reached on Epic Secure Messenger ?09/21/2021, 1:28 PM ? ?If 7pm- 7am, please page neurology on call as listed in [place].  ?

## 2021-09-21 NOTE — Progress Notes (Signed)
ANTICOAGULATION CONSULT NOTE ?Pharmacy Consult for heparin ?Indication: atrial fibrillation ? ? ?Allergies  ?Allergen Reactions  ? Triptans Palpitations  ?  Reaction to Maxalt  ? ? ?Patient Measurements: ?Height: '5\' 7"'$  (170.2 cm) ?Weight: 74.4 kg (164 lb 1.4 oz) ?IBW/kg (Calculated) : 66.1 ?Heparin Dosing Weight: 77kg ? ?Vital Signs: ?Temp: 99.1 ?F (37.3 ?C) (05/11 0500) ?Temp Source: Rectal (05/10 2000) ?Pulse Rate: 65 (05/11 0545) ? ?Labs: ?Recent Labs  ?  09/19/21 ?0441 09/19/21 ?2319 09/19/21 ?2332 09/20/21 ?0150 09/20/21 ?1610 09/20/21 ?1141 09/20/21 ?1828 09/20/21 ?1954 09/21/21 ?0500  ?HGB 13.2   < > 12.0*  --  11.8*  --  12.9*  --  11.8*  ?HCT 45.1   < > 41.5  --  39.7  --  38.0*  --  39.9  ?PLT 402*  --  421*  --  348  --   --   --  435*  ?APTT 90*  --   --   --  35  --   --  58* 51*  ?LABPROT  --   --  21.9*  --   --   --   --   --   --   ?INR  --   --  1.9*  --   --   --   --   --   --   ?HEPARINUNFRC >1.10*  --   --   --  >1.10*  --   --   --  >1.10*  ?CREATININE 1.70*  --  2.75*  --  3.09*  --   --   --  3.93*  ?TROPONINIHS  --   --   --  396* 589* 646*  --   --   --   ? < > = values in this interval not displayed.  ? ?Estimated Creatinine Clearance: 20.1 mL/min (A) (by C-G formula based on SCr of 3.93 mg/dL (H)). ? ?Assessment: ?56 y.o. male with hx AF on apixaban PTA. Pt transitioned to IV heparin with possible need for procedures. Pt s/p PEA arrest earlier this am 5/10. ? ?Heparin level >1.1 and aPTT subtherapeutic (51 sec) on gtt at 900 units/hr. Values are not correlating at this time. No issues with infusion or bleeding reported per RN. ? ?Goal of Therapy:  ?Heparin level 0.3-0.7 units/ml ?aPTT 66-102 seconds ?Monitor platelets by anticoagulation protocol: Yes ?  ?Plan:  ?Increase heparin to 1100 units/h ?Recheck aPTT/heparin level in a.m. (~8h post rate change) ? ?Georga Bora, PharmD ?Clinical Pharmacist ?09/21/2021 6:26 AM ?Please check AMION for all [place] numbers ? ? ? ?

## 2021-09-21 NOTE — IPAL (Signed)
?  Interdisciplinary Goals of Care Family Meeting ? ? ?Date carried out: 09/21/2021 ? ?Location of the meeting: Conference room ? ?Member's involved: Physician, Bedside Registered Nurse, and Family Member or next of kin ? ?Durable Power of Tour manager: Wife and children   ? ?Discussion: We discussed goals of care for [name] .   ? ?Discussed his QoL issues dealing with SLE complications.  He has stated in past to his family that he would not want lifelong HD and being able to work independently was very important to him. ? ?We discussed how lungs and BP are a bit better.  Kidneys are not working.  Recovery of neurological function is currently in grey area. ? ?After much discussion, we agreed to continue iHD and/or CRRT, determine if will have good neurological function return. If he does plan will be to try to wean him from vent and ask him directly whether he would prefer hospice or return to [facility] as OP. ? ?Code status: Full DNR ? ?Disposition: Continue current acute care ? ?Time spent for the meeting: 20 minutes ? ? ? ?Candee Furbish, MD ? ?09/21/2021, 9:24 AM ? ? ?

## 2021-09-21 NOTE — Progress Notes (Signed)
Latest Reference Range & Units 09/21/21 00:10 09/21/21 05:03 09/21/21 07:53 09/21/21 11:33  ?Glucose-Capillary 70 - 99 mg/dL 205 (H) 276 (H) 238 (H) 239 (H)  ?(H): Data is abnormally high ? ?Noted that blood sugars continue to be greater than 180 mg/dl.  ? ?Recommend adding Novolog 3 units every 4 hours for tube feed coverage if blood sugars continue to be elevated. ? ?Harvel Ricks RN BSN CDE ?Diabetes Coordinator ?Pager: [phone]  8am-5pm  ?

## 2021-09-21 NOTE — Progress Notes (Signed)
ANTICOAGULATION CONSULT NOTE ?Pharmacy Consult for heparin ?Indication: atrial fibrillation ? ? ?Allergies  ?Allergen Reactions  ? Triptans Palpitations  ?  Reaction to Maxalt  ? ? ?Patient Measurements: ?Height: '5\' 7"'$  (170.2 cm) ?Weight:  (Unstable, too many attachment.) ?IBW/kg (Calculated) : 66.1 ?Heparin Dosing Weight: 77kg ? ?Vital Signs: ?Temp: 97.2 ?F (36.2 ?C) (05/11 1341) ?Temp Source: Temporal (05/11 1341) ?BP: 178/73 (05/11 1630) ?Pulse Rate: 71 (05/11 1630) ? ?Labs: ?Recent Labs  ?  09/19/21 ?0441 09/19/21 ?2319 09/19/21 ?2332 09/20/21 ?0150 09/20/21 ?5397 09/20/21 ?1141 09/20/21 ?1828 09/20/21 ?1954 09/21/21 ?0500 09/21/21 ?1353 09/21/21 ?1400 09/21/21 ?1500  ?HGB 13.2   < > 12.0*  --  11.8*  --  12.9*  --  11.8*  --  10.9*  --   ?HCT 45.1   < > 41.5  --  39.7  --  38.0*  --  39.9  --  36.2*  --   ?PLT 402*  --  421*  --  348  --   --   --  435*  --  365  --   ?APTT 90*  --   --   --  35  --   --  58* 51*  --   --  102*  ?LABPROT  --   --  21.9*  --   --   --   --   --   --   --   --   --   ?INR  --   --  1.9*  --   --   --   --   --   --   --   --   --   ?HEPARINUNFRC >1.10*  --   --   --  >1.10*  --   --   --  >1.10*  --   --   --   ?CREATININE 1.70*  --  2.75*  --  3.09*  --   --   --  3.93* 4.22*  --   --   ?TROPONINIHS  --   --   --  396* 589* 646*  --   --   --   --   --   --   ? < > = values in this interval not displayed.  ? ?Estimated Creatinine Clearance: 18.7 mL/min (A) (by C-G formula based on SCr of 4.22 mg/dL (H)). ? ?Assessment: ?56 y.o. male with hx AF on apixaban PTA. Pt transitioned to IV heparin with possible need for procedures. Pt s/p PEA arrest earlier this am 5/10. ? ?aPTT therapeutic (102 sec) on gtt at 1100 units/hr. No bleeding noted.  ? ?Goal of Therapy:  ?Heparin level 0.3-0.7 units/ml ?aPTT 66-102 seconds ?Monitor platelets by anticoagulation protocol: Yes ?  ?Plan:  ?With patient at high end of therapeutic range, will decrease heparin slightly to 1050 units/hr ?F/u  aPTT/heparin level in a.m. ? ?Sherlon Handing, PharmD, BCPS ?Please see amion for complete clinical pharmacist phone list ?09/21/2021 4:38 PM ? ? ? ?

## 2021-09-21 NOTE — Progress Notes (Signed)
Pharmacy Antibiotic Note ? ?Roy Dyer is a 56 y.o. male admitted on 10/08/2021 with pneumonia.  Pharmacy has been consulted for Vancomycin and Meropenem dosing. Pt s/p CAP treatment 5/1-5/5. Pt with worsening respiratory status s/p PEA arrest. With hx immunocompromised state, ABX broadened to include PJP treatment and antifungal coverage. ? ?Vancomycin random ~34hr from last dose is 20 mcg/ml. Renal planning iHD today. Will order maintenance dose for ~48h after last dose (tonight at 1900) and likely check vancomycin random prior to redosing depending on degree of HD tolerance. ? ?Plan: ?Adjust meropenem to 516m IV q24h (iHD dosing) ?Vancomycin 7526mIV x1 tonight ?Continue primaquine, micafungin, clindamycin, azithromycin per MD ?Follow iHD tolerance and further RRT plans closely ? ? ?Height: _0  (170.2 cm) ?Weight: 74.4 kg (164 lb 1.4 oz) ?IBW/kg (Calculated) : 66.1 ? ?Temp (24hrs), Avg:97.4 ?F (36.3 ?C), Min:95.2 ?F (35.1 ?C), Max:99.5 ?F (37.5 ?C) ? ?Recent Labs  ?Lab 09/18/21 ?0418 09/18/21 ?1537 09/19/21 ?0441 09/19/21 ?2332 09/20/21 ?0413085/10/23 ?1141 09/20/21 ?1954 09/21/21 ?0500  ?WBC 17.3*  --  16.6* 23.2* 21.8*  --   --  25.0*  ?CREATININE 1.88* 1.83* 1.70* 2.75* 3.09*  --   --  3.93*  ?LATICACIDVEN  --   --   --   --   --  1.4 1.1  --   ?VANCORANDOM  --   --   --   --   --   --   --  20  ? ?  ?Estimated Creatinine Clearance: 20.1 mL/min (A) (by C-G formula based on SCr of 3.93 mg/dL (H)).   ? ?Allergies  ?Allergen Reactions  ? Triptans Palpitations  ?  Reaction to Maxalt  ? ? ?Antimicrobials this admission: ?5/9 Meropenem >>  ?5/9 Vanc >>  ?5/1 Azith >>5/5; 5/9 >>  ?5/10 Primaquine >> ?5/10 Clindamycin >> ?5/10 Micafungin >> ?5/1 Rocephin >>5/3 ?5/4 Augmentin >>5/5 ? ?Microbiology results: ?5/6 MRSA PCR: negative ?5/10 BAL: pending ?5/10 BCx: NGTD ?5/10 Pneumocystis smear: pending ? ?Thank you for allowing pharmacy to be a part of this patient?s care. ? ?MiArrie SenatePharmD, BCPS, BCCP ?Clinical  Pharmacist ?[phone] check AMION for all MCBennett Springsumbers ?09/21/2021 ? ?

## 2021-09-21 NOTE — Progress Notes (Signed)
Patient ID: Roy Dyer, male   DOB: June 10, 1965, 56 y.o.   MRN: [id number] ?[facility] ?Progress Note  ? ?Assessment/ Plan:   ?1. 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  Presented with acute kidney injury that was suspected to be from CHF decompensation however, continued to worsen and was refractory to diuretics.  He was transferred to the ICU for CRRT which was initiated on 5/6.  Stopped CRRT on 5/9 am as patient euvolemic to dry ?- Patient is anuric with worsening clearance in the setting of trying to assess mental status after an arrest ?- Plan for HD today as staffing permits and anticipate again on 5/13 ?- has RIJ nontunneled catheter as well as AVF    ? ?2. Septic shock ?- abx and pressors per primary team  ?- cellcept reduced as below  ? ?3.  Acute exacerbation of congestive heart failure: Right heart catheterization with elevated biventricular pressures with high cardiac output and severe mixed pulmonary hypertension.  Started on CRRT for extracorporeal volume unloading after refractory to diuretics.  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 ?4. PEA arrest ?- post arrest care per pulm ?- setting of septic shock ? ?5. Acute hypoxic respiratory failure  ?- optimize volume status with CRRT/HD as needed ?- s/p bronch with pulm  ?- supportive care per pulm ? ?6. CKD stage III  ?-baseline Cr around 1.7 however he states closer to 2 more recently ? ?7.  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  ?Rechecking tacrolimus level - not really possible for lower envarsus dose per pharmacy.  Pt is now s/p CRRT as well and may be in range with  change in clearance.  ?- No proteinuria or hematuria and AKI felt pre-renal as he is s/p aggressive diuresis prior to trial of CRRT.  Defer allograft biopsy at this time ? ?8.  Immunosuppression   ?- envarsus was transitioned to tacrolimus due to need to give through tube ?- FK506 level was 9 from 5/9 but was from when on envarsus.   ?- checking another FK506 trough - appreciate pharmacy ?- reduced cellcept to 250 mg BID in the setting of septic shock  ?- Patient is now on solumedrol  ?- would contact transplant ID if have not already - broad spectrum coverage is in place ? ?9. Atrial flutter ?- noted. Per primary team  ? ?10.  Hyponatremia: Secondary to CHF exacerbation and acute kidney injury, improving with volume unloading/CRRT. ? ?11. Hyperphos - setting of AKI; hold phoslo as we are anticipating CRRT again ? ?12. 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 ?Disposition - continue monitoring in ICU ? ?Subjective:   ?He was anuric over 5/10.   He underwent a bronch on 5/10.  CT head no acute process. EEG report with no seizures but severe to profound diffuse encephalopathy.  Has been on levo - most recently at 2 mcg/min as of my exam. They are going to try and wean this.  Not on dobutamine.  He hasn't had acute events overnight per nursing.    ? ?Review of systems:   ?Unable to obtain given intubated  ? ?Objective:   ?BP  98/79   Pulse 68   Temp 99.1 ?F (37.3 ?C)   Resp (!) 24   Ht '5\' 7"'$  (1.702 m)   Wt 73.5 kg   SpO2 100%   BMI 25.38 kg/m?  ? ?Intake/Output Summary (Last 24 hours) at 09/21/2021 0607 ?Last data filed at 09/21/2021 0500 ?Gross per 24 hour  ?Intake 1367.42 ml  ?Output 0 ml  ?Net 1367.42 ml  ? ?Weight change:  ? ?Physical Exam:      ?Gen: adult male in bed critically ill  ?HEENT NCAT; eyes closed ?Neck - trachea midline normal circumference ?CVS: S1S2 do not appreciate Pericardial rub.   ?Resp: coarse mechanical breath sound; FIO2 40 and PEEP 8 ?Abd: Soft, flat, nontender ?Ext: no lower extremity  edema appreciated ?Neuro- does move feet to exam; sedation running ?Psych unable to assess ?Access RIJ nontunn HD catheter; bruit and thrill LUE AVF ? ? ?Imaging: ?CT HEAD WO CONTRAST (5MM) ? ?Result Date: 09/20/2021 ?CLINICAL DATA:  Mental status change with unknown cause EXAM: CT HEAD WITHOUT CONTRAST TECHNIQUE: Contiguous axial images were obtained from the base of the skull through the vertex without intravenous contrast. RADIATION DOSE REDUCTION: This exam was performed according to the departmental dose-optimization program which includes automated exposure control, adjustment of the mA and/or kV according to patient size and/or use of iterative reconstruction technique. COMPARISON:  05/17/2017 FINDINGS: Brain: No evidence of acute infarction, hemorrhage, hydrocephalus, extra-axial collection or mass lesion/mass effect. Vascular: No hyperdense vessel or unexpected calcification. Skull: Normal. Negative for fracture or focal lesion. Sinuses/Orbits: No acute finding. IMPRESSION: Negative head CT. Electronically Signed   By: Jorje Guild M.D.   On: 09/20/2021 06:20  ? ?CT CHEST WO CONTRAST ? ?Result Date: 09/19/2021 ?CLINICAL DATA:  Pneumonia, complication suspected EXAM: CT CHEST WITHOUT CONTRAST TECHNIQUE: Multidetector CT imaging of the chest was performed following the standard protocol without IV contrast. RADIATION DOSE REDUCTION: This exam was performed according to the departmental dose-optimization program which includes automated exposure control, adjustment of the mA and/or kV according to patient size and/or use of iterative reconstruction technique. COMPARISON:  Chest radiograph 09/18/2021 FINDINGS: Cardiovascular: ICD lead RIGHT ventricle via RIGHT subclavian approach. Atherosclerotic calcifications aorta, coronary arteries and proximal great vessels. Aorta normal caliber. Enlargement of cardiac chambers diffusely. Small pericardial effusion. Mediastinum/Nodes: Esophagus unremarkable. Base of  cervical region normal appearance. Few normal size mediastinal lymph nodes. No definite adenopathy, though hilar assessment is limited by lack of IV contrast. Lungs/Pleura: Extensive BILATERAL airspace infiltrates which could represent multifocal pneumonia or less likely asymmetric edema slightly greater on RIGHT. No pleural effusion or pneumothorax. No discrete mass identified. No pneumothorax. Upper Abdomen: Assessment of upper abdomen degraded by motion artifacts. Significant renal cortical atrophy and probable renal cysts, inadequately assessed, patient post renal transplant by history. Musculoskeletal: No acute osseous findings. IMPRESSION: Extensive BILATERAL airspace infiltrates which could represent multifocal pneumonia or less likely asymmetric edema. Enlargement of cardiac chambers with small pericardial effusion. Extensive atherosclerotic disease changes including coronary arteries. Significant renal cortical atrophy and probable renal cysts, inadequately assessed due to motion. Aortic Atherosclerosis (ICD10-I70.0). Electronically Signed   By: Lavonia Dana M.D.   On: 09/19/2021 12:31  ? ?DG CHEST PORT 1 VIEW ? ?Result Date: 09/19/2021 ?CLINICAL DATA:  Respiratory failure EXAM: PORTABLE CHEST 1 VIEW COMPARISON:  09/18/2021 FINDINGS: Endotracheal tube is seen 5.6 cm above the carina. Nasogastric tube extends in the upper abdomen beyond the margin of the examination. Right internal jugular hemodialysis catheter tip noted within the superior vena  cava. Right subclavian single lead pacemaker defibrillator is unchanged overlying the right ventricle. Diffuse ground-glass pulmonary infiltrate is again seen, stable since prior examination, in keeping with alveolar pulmonary edema, diffuse pneumonic infiltrate, or ARDS. No pneumothorax or pleural effusion. Moderate cardiomegaly is stable. IMPRESSION: Stable support lines and tubes. Stable moderate cardiomegaly. Stable diffuse pulmonary infiltrate Electronically Signed    By: Fidela Salisbury M.D.   On: 09/19/2021 23:49  ? ?DG Abd Portable 1V ? ?Result Date: 09/20/2021 ?CLINICAL DATA:  Feeding tube placement EXAM: PORTABLE ABDOMEN - 1 VIEW COMPARISON:  None Available. FINDINGS: Fee

## 2021-09-21 NOTE — Progress Notes (Signed)
? ?NAME:  Roy Dyer, MRN:  [id number], DOB:  12-Mar-1966, LOS: 10 ?ADMISSION DATE:  09/23/2021, CONSULTATION DATE:  09/16/21 ?REFERRING MD:  TRH, CHIEF COMPLAINT:  SOB  ? ?History of Present Illness:  ?56 year old man with hx of ESRD s/p renal transplant now with CKD, CAD, Afib who presented with insidious onset SOB.  Found to be in biventricular failure, failed diuretic+inotrope trial.  Due to low Bps, worsening dyspnea and edema, transferred to ICU and PCCM to assume care from [facility] ? ?Patient doing okay as long as he sits up, dyspnea with minimal movement. ? ?Pertinent  Medical History  ?History of renal transplant ?Aflutter ?SLE ?CAD ?OSA ? ? ?Significant Hospital Events: ?Including procedures, antibiotic start and stop dates in addition to other pertinent events   ?5/1 admitted ?5/3 R/HC ?[place] 09/14/21 ?RA = 11 (no significant v-waves) ?RV = 77/15 ?PA = 79/35 (55) ?PCW = 24 ?Fick cardiac output/index = 6.96/3.61 ?Thermo = 6.92/3.59 ?PVR = 4.5 WU ?RA sat = 99% ?PA sat = 71%, 77%, 79% ?SVC sat = 86% ?5/6 worsening resp distress, to ICU, CRRT started ?5/9 euvolemic, CRRT Dc'd, witnessed PEA arrest x 12 mins ? ?Interim History / Subjective:  ?On sedation. ?Seems to have some purposeful movements. ?Triggering vent. ?O2 needs greatly improved. ?On minimal levophed. ? ?Objective   ?Blood pressure 98/79, pulse 68, temperature 98.1 ?F (36.7 ?C), temperature source Rectal, resp. rate 20, height _0  (1.702 m), weight 74.4 kg, SpO2 100 %. ?CVP:  [4 mmHg-12 mmHg] 6 mmHg  ?Vent Mode: PRVC ?FiO2 (%):  [40 %-90 %] 40 % ?Set Rate:  [20 bmp] 20 bmp ?Vt Set:  [530 mL] 530 mL ?PEEP:  [8 cmH20-10 cmH20] 8 cmH20 ?Plateau Pressure:  [21 KGU54-27 cmH20] 24 cmH20  ? ?Intake/Output Summary (Last 24 hours) at 09/21/2021 0930 ?Last data filed at 09/21/2021 0900 ?Gross per 24 hour  ?Intake 1632.93 ml  ?Output 0 ml  ?Net 1632.93 ml  ? ? ?Filed Weights  ? 09/18/21 0500 09/19/21 0557 09/21/21 0545  ?Weight: 72.7 kg 73.5 kg 74.4 kg   ? ? ?Examination: ?Intubated, sedated, triggering vent ?Lungs with crackles bilaterally ?Ext warm ?LUE fistula good thrill ?Moving all 4 ext but not really sure its to command ?L>R pupils, reactive ?Ext without edema ?Skin pale ? ?Abdomen soft ? ?Resolved Hospital Problem list   ?N/A ? 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 Treating for PJP+ atypical CAP+ fungal + HCAP.  However, further discussion with family today reveals chronic aspiration type symptoms which may be proximal cause to his progressive weight loss, dyspnea and now ARDS. 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  Was thought that his dypnea was driven by high output cardiac failure from large LUE fistula; however O2 needs and edema type pattern persisted despite euvolemia leading to CT of chest and findings of DAD ?Hx SLE, renal tx on DMARD/pred PTA ?AKI on CKD- with IHCA suspicion ?FTT POA ?Moderate to severe protein calorie malnutrition POA ? ?- Vent support with LTVV and VAP prevention bundle ?- Vanc/ cefepime/ azithromycin/ eraxis/ clinda/ primaquine; narrow pending BAL data from 5/10 ?- Avoid fevers with arctic sun ?- HD per Dr. Luis Abed team ?- IV steroids, DMARDs on hold ?- See IPAL note today, if does not wake up meaningfully or show improvements in next day or two family likely  to transition to comfort care, will discuss plan with oncoming physician Dr. Carlis Abbott ? ?Best Practice (right click and "Reselect all SmartList Selections" daily)  ? ?Diet/type: TF ?DVT prophylaxis: heparin gtt ?GI prophylaxis: H2B ?Lines: Dialysis Catheter ?Foley:  N/A ?Code Status:  DNR ?Last date of multidisciplinary goals of care discussion [5/11] ? ?35 min cc time ?Erskine Emery MD PCCM ? ? ?

## 2021-09-22 ENCOUNTER — Inpatient Hospital Stay (HOSPITAL_COMMUNITY): Payer: Managed Care, Other (non HMO)

## 2021-09-22 DIAGNOSIS — J9601 Acute respiratory failure with hypoxia: Secondary | ICD-10-CM | POA: Diagnosis not present

## 2021-09-22 DIAGNOSIS — I5043 Acute on chronic combined systolic (congestive) and diastolic (congestive) heart failure: Secondary | ICD-10-CM | POA: Diagnosis not present

## 2021-09-22 DIAGNOSIS — I469 Cardiac arrest, cause unspecified: Secondary | ICD-10-CM | POA: Diagnosis not present

## 2021-09-22 DIAGNOSIS — N179 Acute kidney failure, unspecified: Secondary | ICD-10-CM | POA: Diagnosis not present

## 2021-09-22 DIAGNOSIS — N189 Chronic kidney disease, unspecified: Secondary | ICD-10-CM | POA: Diagnosis not present

## 2021-09-22 LAB — HEPATIC FUNCTION PANEL
ALT: 42 U/L (ref 0–44)
AST: 55 U/L — ABNORMAL HIGH (ref 15–41)
Albumin: 2.2 g/dL — ABNORMAL LOW (ref 3.5–5.0)
Alkaline Phosphatase: 181 U/L — ABNORMAL HIGH (ref 38–126)
Bilirubin, Direct: 0.4 mg/dL — ABNORMAL HIGH (ref 0.0–0.2)
Indirect Bilirubin: 0.5 mg/dL (ref 0.3–0.9)
Total Bilirubin: 0.9 mg/dL (ref 0.3–1.2)
Total Protein: 4.7 g/dL — ABNORMAL LOW (ref 6.5–8.1)

## 2021-09-22 LAB — COOXEMETRY PANEL
Carboxyhemoglobin: 1.6 % — ABNORMAL HIGH (ref 0.5–1.5)
Methemoglobin: 0.9 % (ref 0.0–1.5)
O2 Saturation: 93.9 %
Total hemoglobin: 11.5 g/dL — ABNORMAL LOW (ref 12.0–16.0)

## 2021-09-22 LAB — GLUCOSE, CAPILLARY
Glucose-Capillary: 172 mg/dL — ABNORMAL HIGH (ref 70–99)
Glucose-Capillary: 239 mg/dL — ABNORMAL HIGH (ref 70–99)
Glucose-Capillary: 248 mg/dL — ABNORMAL HIGH (ref 70–99)
Glucose-Capillary: 182 mg/dL — ABNORMAL HIGH (ref 70–99)

## 2021-09-22 LAB — RENAL FUNCTION PANEL
Albumin: 2.2 g/dL — ABNORMAL LOW (ref 3.5–5.0)
Anion gap: 13 (ref 5–15)
BUN: 53 mg/dL — ABNORMAL HIGH (ref 6–20)
CO2: 23 mmol/L (ref 22–32)
Calcium: 8.1 mg/dL — ABNORMAL LOW (ref 8.9–10.3)
Chloride: 98 mmol/L (ref 98–111)
Creatinine, Ser: 2.76 mg/dL — ABNORMAL HIGH (ref 0.61–1.24)
GFR, Estimated: 26 mL/min — ABNORMAL LOW (ref >60)
Glucose, Bld: 230 mg/dL — ABNORMAL HIGH (ref 70–99)
Phosphorus: 4.6 mg/dL (ref 2.5–4.6)
Potassium: 3.7 mmol/L (ref 3.5–5.1)
Sodium: 134 mmol/L — ABNORMAL LOW (ref 135–145)

## 2021-09-22 LAB — CULTURE, RESPIRATORY W GRAM STAIN
Culture: NO GROWTH
Gram Stain: NONE SEEN

## 2021-09-22 LAB — CBC
HCT: 36.4 % — ABNORMAL LOW (ref 39.0–52.0)
Hemoglobin: 11.2 g/dL — ABNORMAL LOW (ref 13.0–17.0)
MCH: 23.4 pg — ABNORMAL LOW (ref 26.0–34.0)
MCHC: 30.8 g/dL (ref 30.0–36.0)
MCV: 76.2 fL — ABNORMAL LOW (ref 80.0–100.0)
Platelets: 358 10*3/uL (ref 150–400)
RBC: 4.78 MIL/uL (ref 4.22–5.81)
RDW: 19.1 % — ABNORMAL HIGH (ref 11.5–15.5)
WBC: 23.4 10*3/uL — ABNORMAL HIGH (ref 4.0–10.5)
nRBC: 0 % (ref 0.0–0.2)

## 2021-09-22 LAB — MAGNESIUM: Magnesium: 2.6 mg/dL — ABNORMAL HIGH (ref 1.7–2.4)

## 2021-09-22 LAB — PROCALCITONIN: Procalcitonin: 14.22 ng/mL

## 2021-09-22 LAB — HEPATITIS B SURFACE ANTIBODY, QUANTITATIVE
Hep B S AB Quant (Post): <3.1 m[IU]/mL — ABNORMAL LOW (ref >9.9)
Hep B S AB Quant (Post): <3.1 m[IU]/mL — ABNORMAL LOW (ref >9.9)

## 2021-09-22 LAB — HEPARIN LEVEL (UNFRACTIONATED): Heparin Unfractionated: >1.1 IU/mL — ABNORMAL HIGH (ref 0.30–0.70)

## 2021-09-22 LAB — APTT: aPTT: 72 seconds — ABNORMAL HIGH (ref 24–36)

## 2021-09-22 MED ORDER — INSULIN ASPART 100 UNIT/ML IJ SOLN
3.0000 [IU] | INTRAMUSCULAR | Status: DC
  Administered 2021-09-22 (×3): 3 [IU] via SUBCUTANEOUS

## 2021-09-22 MED ORDER — LORAZEPAM 2 MG/ML IJ SOLN
2.0000 mg | INTRAMUSCULAR | Status: DC | PRN
  Administered 2021-09-22: 2 mg via INTRAVENOUS
  Administered 2021-09-22: 4 mg via INTRAVENOUS
  Filled 2021-09-22: qty 2
  Filled 2021-09-22: qty 1

## 2021-09-22 MED ORDER — LORAZEPAM 2 MG/ML IJ SOLN: 2.0000 mg | INTRAMUSCULAR | Status: DC | PRN

## 2021-09-22 MED ORDER — HALOPERIDOL LACTATE 5 MG/ML IJ SOLN: 2.5000 mg | INTRAMUSCULAR | Status: DC | PRN

## 2021-09-22 MED ORDER — POLYVINYL ALCOHOL 1.4 % OP SOLN
1.0000 [drp] | Freq: Four times a day (QID) | OPHTHALMIC | Status: DC | PRN
  Filled 2021-09-22: qty 15

## 2021-09-22 MED ORDER — GLYCOPYRROLATE 1 MG PO TABS
1.0000 mg | ORAL_TABLET | ORAL | Status: DC | PRN
  Filled 2021-09-22: qty 1

## 2021-09-22 MED ORDER — CHLORHEXIDINE GLUCONATE CLOTH 2 % EX PADS: 6.0000 | MEDICATED_PAD | Freq: Every day | CUTANEOUS | Status: DC

## 2021-09-22 MED ORDER — GLYCOPYRROLATE 0.2 MG/ML IJ SOLN
0.2000 mg | INTRAMUSCULAR | Status: DC | PRN
  Filled 2021-09-22: qty 1

## 2021-09-22 MED ORDER — INSULIN GLARGINE-YFGN 100 UNIT/ML ~~LOC~~ SOLN
5.0000 [IU] | Freq: Every day | SUBCUTANEOUS | Status: DC
  Administered 2021-09-22: 5 [IU] via SUBCUTANEOUS
  Filled 2021-09-22: qty 0.05

## 2021-09-22 MED ORDER — ACETAMINOPHEN 325 MG PO TABS: 650.0000 mg | ORAL_TABLET | Freq: Four times a day (QID) | ORAL | Status: DC | PRN

## 2021-09-22 MED ORDER — ACETAMINOPHEN 650 MG RE SUPP: 650.0000 mg | Freq: Four times a day (QID) | RECTAL | Status: DC | PRN

## 2021-09-22 MED ORDER — DIPHENHYDRAMINE HCL 50 MG/ML IJ SOLN: 25.0000 mg | INTRAMUSCULAR | Status: DC | PRN

## 2021-09-22 MED ORDER — MORPHINE BOLUS VIA INFUSION
5.0000 mg | INTRAVENOUS | Status: DC | PRN
  Administered 2021-09-22: 5 mg via INTRAVENOUS
  Filled 2021-09-22: qty 5

## 2021-09-22 MED ORDER — MIDAZOLAM HCL 2 MG/2ML IJ SOLN
2.0000 mg | INTRAMUSCULAR | Status: AC
  Administered 2021-09-22: 2 mg via INTRAVENOUS
  Filled 2021-09-22: qty 2

## 2021-09-22 MED ORDER — MORPHINE SULFATE (PF) 2 MG/ML IV SOLN: 2.0000 mg | INTRAVENOUS | Status: DC | PRN

## 2021-09-22 MED ORDER — DEXTROSE 5 % IV SOLN: INTRAVENOUS | Status: DC

## 2021-09-22 MED ORDER — VANCOMYCIN HCL 750 MG/150ML IV SOLN: 750.0000 mg | INTRAVENOUS | Status: DC

## 2021-09-22 MED ORDER — MORPHINE 100MG IN NS 100ML (1MG/ML) PREMIX INFUSION
0.0000 mg/h | INTRAVENOUS | Status: DC
  Administered 2021-09-22: 5 mg/h via INTRAVENOUS
  Filled 2021-09-22: qty 100

## 2021-09-22 MED ORDER — GLYCOPYRROLATE 0.2 MG/ML IJ SOLN
0.2000 mg | INTRAMUSCULAR | Status: DC | PRN
  Administered 2021-09-22: 0.2 mg via INTRAVENOUS

## 2021-09-23 LAB — ASPERGILLUS ANTIGEN, BAL/SERUM: Aspergillus Ag, BAL/Serum: 0.12 Index (ref 0.00–0.49)

## 2021-09-23 NOTE — Progress Notes (Signed)
?   09-25-2021 1900  ?Clinical Encounter Type  ?Visited With Family;Health care provider  ?Visit Type Initial;Spiritual support;Other (Comment) ?(End of Life for Patient)  ?Referral From Nurse  ?Consult/Referral To Chaplain  ? ?Chaplain was called to meet with family who had made decision to place loved one on comfort care.  Family included wife, 1 son, 1 daughter and husband, 1 niece, 1 sister and husband who shared beautiful tributes as they remembered a man of strength and compassion. Chaplain provided emotional and spiritual support, including prayer as patient transitioned. Also, spoke with Nurse Estill Bamberg who offered compassion to family.  ? ?Roy Dyer, Resident Chaplain ?[phone] ?

## 2021-09-25 LAB — CULTURE, BLOOD (ROUTINE X 2)
Culture: NO GROWTH
Culture: NO GROWTH
Special Requests: ADEQUATE

## 2021-09-25 LAB — PNEUMOCYSTIS JIROVECI SMEAR BY DFA

## 2021-09-26 LAB — FUNGITELL, SERUM: Fungitell Result: 53 pg/mL (ref <80)

## 2021-09-26 LAB — VIRAL CULTURE, RAPID, CMV

## 2021-10-03 MED FILL — Medication: Qty: 1 | Status: AC

## 2021-10-04 LAB — CULTURE, FUNGUS WITHOUT SMEAR

## 2021-10-05 ENCOUNTER — Ambulatory Visit: Payer: Managed Care, Other (non HMO) | Admitting: Family Medicine

## 2021-10-05 ENCOUNTER — Telehealth: Payer: Self-pay

## 2021-10-05 NOTE — Telephone Encounter (Signed)
I forgot to route to [employer].

## 2021-10-05 NOTE — Telephone Encounter (Signed)
Type of forms received:  Attending Physician Statement   Routed to:  [employer] [place]  Paperwork received by :  Helmut Muster (spouse) Please call when ready for pick up.   Individual made aware of 3-5 business day turn around (Y/N): yes  Form completed and patient made aware of charges(Y/N): N/a  Faxed to :  Sharyn Lull will pcik up  Form location:   [place] inbox

## 2021-10-05 NOTE — Telephone Encounter (Signed)
Placed on PCP desk to review and sign, if appropriate.  

## 2021-10-05 NOTE — Discharge Summary (Signed)
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 Acute systolic heart failure, cardiogenic shock  Diagnoses  Preliminary cause of death:  Secondary Diagnoses (including complications and co-morbidities):  Active Problems:   Mixed hyperlipidemia   Essential hypertension, benign   LUPUS   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 year old male who was admitted for gradual onset dyspnea found to be in cardiogenic shock with biventricular heart failure. 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 Later developed acute hypoxic respiratory failure due to aspiration pneumonitis leading to ARDS, IHCA on 5/9 with recurrent aspiration event, suspected anoxic brain injury. As it became clear that his recovery would be prolonged and would involve at minimum some time in either [facility] or SNF away from home, his family felt this was inconsistent with his goals of care and decision made to focus on comfort measures only. He died at approximately  23:30 on 10-11-21.    Pertinent Labs and Studies  Significant Diagnostic Studies DG Chest 2 View  Result Date: Sep 30, 2021 CLINICAL DATA:  Shortness of breath. EXAM: CHEST - 2 VIEW COMPARISON:  February 14, 2021 FINDINGS: A single lead ventricular pacer is seen. Mild diffusely increased interstitial lung markings are seen. There is mild to moderate severity bilateral infrahilar atelectasis and/or infiltrate. There is no evidence of a pleural effusion or pneumothorax. Mild bilateral perihilar pulmonary vascular prominence is seen. The cardiac silhouette is markedly enlarged and unchanged in size. The visualized skeletal structures are unremarkable. IMPRESSION: 1. Stable cardiomegaly with mild congestive heart failure. 2. Mild to moderate severity bilateral infrahilar atelectasis and/or infiltrate. Electronically Signed   By: Virgina Norfolk M.D.   On: 2021-09-30 16:59   CT HEAD WO CONTRAST (5MM)  Result Date: 09/20/2021 CLINICAL DATA:  Mental status change with unknown cause EXAM: CT HEAD WITHOUT CONTRAST TECHNIQUE: Contiguous axial images were obtained from the base of the skull through the vertex without intravenous contrast. RADIATION DOSE REDUCTION: This exam was performed according to the departmental dose-optimization program which includes automated exposure control, adjustment of the mA and/or kV according to patient size and/or use of iterative reconstruction technique. COMPARISON:  05/17/2017 FINDINGS: Brain: No evidence of acute infarction, hemorrhage, hydrocephalus, extra-axial collection or mass lesion/mass effect. Vascular: No hyperdense vessel or unexpected calcification. Skull: Normal. Negative for fracture or focal lesion. Sinuses/Orbits: No acute finding. IMPRESSION: Negative head CT. Electronically Signed   By: Jorje Guild M.D.   On: 09/20/2021 06:20   CT CHEST WO CONTRAST  Result Date: 09/19/2021 CLINICAL DATA:  Pneumonia, complication suspected EXAM: CT CHEST WITHOUT CONTRAST  TECHNIQUE: Multidetector CT imaging of the chest was performed following the standard protocol without IV contrast. RADIATION DOSE REDUCTION: This exam was performed according to the departmental dose-optimization program which includes automated exposure control, adjustment of the mA and/or kV according to patient size and/or use of iterative reconstruction technique. COMPARISON:  Chest radiograph 09/18/2021 FINDINGS: Cardiovascular: ICD lead RIGHT ventricle via RIGHT subclavian approach. Atherosclerotic calcifications aorta, coronary arteries and proximal great vessels. Aorta normal caliber. Enlargement of cardiac chambers diffusely. Small pericardial effusion. Mediastinum/Nodes: Esophagus unremarkable. Base of cervical region normal appearance. Few normal size mediastinal lymph nodes. No definite adenopathy, though hilar assessment is limited by lack of IV contrast. Lungs/Pleura: Extensive BILATERAL airspace infiltrates which could represent multifocal pneumonia or less likely asymmetric edema slightly greater on RIGHT. No pleural effusion or pneumothorax. No discrete mass identified. No pneumothorax. Upper Abdomen: Assessment of upper abdomen degraded by motion artifacts. Significant renal cortical atrophy and probable renal cysts, inadequately assessed, patient post renal transplant by history. Musculoskeletal: No acute osseous findings. IMPRESSION: Extensive BILATERAL airspace infiltrates which could represent multifocal pneumonia or less likely asymmetric edema. Enlargement of cardiac chambers with small pericardial effusion. Extensive atherosclerotic disease changes including coronary arteries. Significant renal cortical atrophy and probable renal cysts, inadequately assessed due to motion. Aortic Atherosclerosis (ICD10-I70.0). Electronically Signed   By: Lavonia Dana M.D.   On: 09/19/2021 12:31   MR BRAIN WO CONTRAST  Result Date: 2021/10/13 CLINICAL DATA:  Assess for anoxic brain injury EXAM: MRI HEAD  WITHOUT CONTRAST TECHNIQUE: Multiplanar, multiecho pulse sequences of the brain and surrounding structures were obtained without intravenous contrast. COMPARISON:  CT head 09/20/2021 FINDINGS: Brain: There is no evidence of acute intracranial hemorrhage, extra-axial fluid collection, or acute infarct. There is no evidence of anoxic brain injury Parenchymal volume is normal. The ventricles are normal in size. Gray-white differentiation is preserved. Parenchymal signal is within normal limits There is no mass lesion.  There is no mass effect or midline shift. Vascular: Normal flow voids. Skull and upper cervical spine: There is a 1.2 cm focus of T1 and T2 hyperintensity in the skull base adjacent to the left sphenoid sinus corresponding to a lucent lesion seen on prior CT going back to 2019, likely benign given stability. There is no suspicious osseous signal abnormality. Sinuses/Orbits: The paranasal sinuses are clear. The globes and orbits are unremarkable. Other: A nasoenteric catheter is partially imaged. There are bilateral mastoid effusions which may be related to instrumentation. IMPRESSION: No evidence of anoxic brain injury. Electronically Signed   By: Valetta Mole M.D.   On: 2021-10-13 13:40   US Renal Transplant w/Doppler  Result Date: 09/12/2021 CLINICAL DATA:  Acute kidney injury.  Renal transplant. EXAM: ULTRASOUND OF RENAL TRANSPLANT WITH RENAL DOPPLER ULTRASOUND TECHNIQUE: Ultrasound examination of the renal transplant was performed with gray-scale, color and duplex doppler evaluation. COMPARISON:  Renal transplant ultrasound 02/06/2021. FINDINGS: Transplant kidney location: RLQ Transplant Kidney: Renal measurements: 10.3 x 4.8 x 5.4 cm = volume: 170m. Normal in size and parenchymal echogenicity. No evidence of mass or hydronephrosis. No peri-transplant fluid collection seen. Color flow in the main renal artery:  Yes Color flow in the main renal vein:  Yes Duplex Doppler Evaluation: Main Renal  Artery Velocity: 87.89 cm/sec Main Renal Artery Resistive Index: 0.84 Venous waveform in main renal vein:  Present Intrarenal resistive index in upper pole:  0.76 (normal 0.6-0.8; equivocal 0.8-0.9; abnormal >= 0.9) Intrarenal resistive index in lower pole: 0.75 (normal 0.6-0.8; equivocal 0.8-0.9; abnormal >= 0.9) Bladder: Normal  for degree of bladder distention. Other findings:  None. IMPRESSION: 1. Right lower quadrant renal transplant within normal limits. Electronically Signed   By: Ronney Asters M.D.   On: 09/12/2021 02:05   CARDIAC CATHETERIZATION  Result Date: 10/03/2021 Findings: RA = 11 (no significant v-waves) RV = 77/15 PA = 79/35 (55) PCW = 24 Fick cardiac output/index = 6.96/3.61 Thermo = 6.92/3.59 PVR = 4.5 WU FA sat = 99% PA sat = 71%, 77%, 79% SVC sat = 86% Assessment: 1. Elevated biventricular pressures with high cardiac output in setting of known peripheral shunt (AVF) 2. Severe mixed pulmonary HTN Plan/Discussion: Continue diuresis [name], MD 1:41 PM  DG CHEST PORT 1 VIEW  Result Date: 09/21/2021 CLINICAL DATA:  Ventilator dependence. EXAM: PORTABLE CHEST 1 VIEW COMPARISON:  09/19/2021 FINDINGS: 0615 hours. Endotracheal tube tip is 6.6 cm above the base of the carina. A feeding tube passes into the stomach although the distal tip position is not included on the film. The cardio pericardial silhouette is enlarged. There is pulmonary vascular congestion with probable underlying interstitial edema. Right central line tip overlies the mid SVC. Right pacer/AICD again noted. Telemetry leads overlie the chest. IMPRESSION: 1. Endotracheal tube tip is 6.6 cm above the base of the carina. 2. Pulmonary vascular congestion with similar underlying interstitial edema. Electronically Signed   By: Misty Stanley M.D.   On: 09/21/2021 09:03   DG CHEST PORT 1 VIEW  Result Date: 09/19/2021 CLINICAL DATA:  Respiratory failure EXAM: PORTABLE CHEST 1 VIEW COMPARISON:  09/18/2021 FINDINGS:  Endotracheal tube is seen 5.6 cm above the carina. Nasogastric tube extends in the upper abdomen beyond the margin of the examination. Right internal jugular hemodialysis catheter tip noted within the superior vena cava. Right subclavian single lead pacemaker defibrillator is unchanged overlying the right ventricle. Diffuse ground-glass pulmonary infiltrate is again seen, stable since prior examination, in keeping with alveolar pulmonary edema, diffuse pneumonic infiltrate, or ARDS. No pneumothorax or pleural effusion. Moderate cardiomegaly is stable. IMPRESSION: Stable support lines and tubes. Stable moderate cardiomegaly. Stable diffuse pulmonary infiltrate Electronically Signed   By: Fidela Salisbury M.D.   On: 09/19/2021 23:49   DG Chest Port 1 View  Result Date: 09/18/2021 CLINICAL DATA:  ARDS. EXAM: PORTABLE CHEST 1 VIEW COMPARISON:  Sep 16, 2021. FINDINGS: Stable cardiomegaly. Stable right sided defibrillator. Decreased bilateral lung opacities are noted suggesting improving edema or pneumonia. Bony thorax is unremarkable. IMPRESSION: Decreased bilateral lung opacities are noted suggesting improving edema or pneumonia. Electronically Signed   By: Marijo Conception M.D.   On: 09/18/2021 08:05   DG CHEST PORT 1 VIEW  Result Date: 09/16/2021 CLINICAL DATA:  Shortness of breath. Respiratory failure. Central line placement. EXAM: PORTABLE CHEST 1 VIEW COMPARISON:  09/14/2021 FINDINGS: New right jugular dual-lumen central venous catheter is seen with tip overlying the superior cavoatrial junction. No evidence of pneumothorax. Stable cardiomegaly. Pacemaker remains in place. Worsening diffuse bilateral pulmonary airspace disease is seen, likely due to pulmonary edema. IMPRESSION: New right jugular dual-lumen central venous catheter in appropriate position. No evidence of pneumothorax. Worsening diffuse bilateral pulmonary airspace disease, likely due to pulmonary edema. Electronically Signed   By: Marlaine Hind M.D.    On: 09/16/2021 14:09   DG CHEST PORT 1 VIEW  Result Date: 09/14/2021 CLINICAL DATA:  Difficulty breathing EXAM: PORTABLE CHEST 1 VIEW COMPARISON:  09/17/2021 FINDINGS: Cardiac shadow remains enlarged. Defibrillator is again seen. Increased vascular congestion is noted with mild edema consistent with congestive failure. No  other focal abnormality is noted. IMPRESSION: Changes of congestive failure worsened in the interval from the prior exam Electronically Signed   By: Inez Catalina M.D.   On: 09/14/2021 00:48   DG Abd Portable 1V  Result Date: 09/20/2021 CLINICAL DATA:  Feeding tube placement EXAM: PORTABLE ABDOMEN - 1 VIEW COMPARISON:  None Available. FINDINGS: Feeding tube enters the stomach with the tip in the distal body of the stomach. Normal bowel gas pattern. AICD with lead in the region of the right ventricle. IMPRESSION: Feeding tube tip distal body of stomach.  Normal bowel gas pattern. Electronically Signed   By: Franchot Gallo M.D.   On: 09/20/2021 15:13   Overnight EEG with video  Result Date: 09/20/2021 Lora Havens, MD     09/20/2021  1:05 PM Patient Name: Roy Dyer MRN: [id number] Epilepsy Attending: Lora Havens Referring Physician/Provider: Donnetta Simpers, MD Duration: 09/20/2021 0730 to 1120  Patient history: 56 year old male status post cardiac arrest.  EEG to evaluate for seizure.  Level of alertness: comatose  AEDs during EEG study: Clonazepam  Technical aspects: This EEG study was done with scalp electrodes positioned according to the 10-20 International system of electrode placement. Electrical activity was acquired at a sampling rate of _0  and reviewed with a high frequency filter of _1  and a low frequency filter of _2 . EEG data were recorded continuously and digitally stored.  Description: EEG showed burst suppression pattern with bursts of 3 to 5 Hz theta-delta slowing lasting 2 to 3 seconds alternating with 2 to 3 seconds of generalized EEG suppression.  Hyperventilation and photic stimulation were not performed.    ABNORMALITY -Burst suppression, generalized  IMPRESSION: This study is suggestive of severe to profound diffuse encephalopathy, nonspecific to etiology. No seizures or epileptiform discharges were seen throughout the recording.  Lora Havens    ECHOCARDIOGRAM COMPLETE  Result Date: 09/12/2021    ECHOCARDIOGRAM REPORT   Patient Name:   Roy Dyer Date of Exam: 09/12/2021 Medical Rec #:  [id number]    Height:       67.0 in Accession #:    [id number]   Weight:       183.0 lb Date of Birth:  [birth year]    BSA:          1.947 m Patient Age:    63 years     BP:           117/66 mmHg Patient Gender: M            HR:           79 bpm. Exam Location:  Inpatient Procedure: 2D Echo, Cardiac Doppler, Color Doppler and Intracardiac            Opacification Agent Indications:    Elevated troponin  History:        Patient has prior history of Echocardiogram examinations. CHF,                 CAD, Arrythmias:Atrial Flutter; Risk Factors:Hypertension and                 Sleep Apnea.  Sonographer:    Jyl Heinz Referring Phys: Dolliver  1. Left ventricular ejection fraction, by estimation, is 30 to 35%. The left ventricle has moderately decreased function. The left ventricle demonstrates global hypokinesis. The left ventricular internal cavity size was severely dilated. There is mild left ventricular hypertrophy. Left ventricular diastolic parameters are consistent with Grade II diastolic dysfunction (  pseudonormalization). Elevated left atrial pressure.  2. Right ventricular systolic function is mildly reduced. The right ventricular size is moderately enlarged. Moderately increased right ventricular wall thickness. There is severely elevated pulmonary artery systolic pressure. The estimated right ventricular systolic pressure is 84.6 mmHg.  3. Left atrial size was severely dilated.  4. Right atrial size was severely dilated.  5. Moderate  pericardial effusion. IVC fixed/dilated, but no RA/RV collapse seen to suggest tamponade  6. The mitral valve is normal in structure. Mild mitral valve regurgitation. No evidence of mitral stenosis.  7. Tricuspid valve regurgitation is moderate.  8. The aortic valve is tricuspid. Aortic valve regurgitation is trivial. No aortic stenosis is present.  9. The inferior vena cava is dilated in size with <50% respiratory variability, suggesting right atrial pressure of 15 mmHg. FINDINGS  Left Ventricle: Left ventricular ejection fraction, by estimation, is 30 to 35%. The left ventricle has moderately decreased function. The left ventricle demonstrates global hypokinesis. Definity contrast agent was given IV to delineate the left ventricular endocardial borders. The left ventricular internal cavity size was severely dilated. There is mild left ventricular hypertrophy. Left ventricular diastolic parameters are consistent with Grade II diastolic dysfunction (pseudonormalization). Elevated left atrial pressure. Right Ventricle: The right ventricular size is moderately enlarged. Moderately increased right ventricular wall thickness. Right ventricular systolic function is mildly reduced. There is severely elevated pulmonary artery systolic pressure. The tricuspid  regurgitant velocity is 3.88 m/s, and with an assumed right atrial pressure of 15 mmHg, the estimated right ventricular systolic pressure is 96.2 mmHg. Left Atrium: Left atrial size was severely dilated. Right Atrium: Right atrial size was severely dilated. Pericardium: A moderately sized pericardial effusion is present. Mitral Valve: The mitral valve is normal in structure. Mild mitral valve regurgitation. No evidence of mitral valve stenosis. Tricuspid Valve: The tricuspid valve is normal in structure. Tricuspid valve regurgitation is moderate. Aortic Valve: The aortic valve is tricuspid. Aortic valve regurgitation is trivial. No aortic stenosis is present. Aortic  valve peak gradient measures 9.6 mmHg. Pulmonic Valve: The pulmonic valve was normal in structure. Pulmonic valve regurgitation is trivial. Aorta: The aortic root and ascending aorta are structurally normal, with no evidence of dilitation. Venous: The inferior vena cava is dilated in size with less than 50% respiratory variability, suggesting right atrial pressure of 15 mmHg. IAS/Shunts: The interatrial septum was not well visualized.  LEFT VENTRICLE PLAX 2D LVIDd:         6.80 cm      Diastology LVIDs:         5.70 cm      LV e' medial:    4.90 cm/s LV PW:         1.20 cm      LV E/e' medial:  15.1 LV IVS:        1.20 cm      LV e' lateral:   7.51 cm/s LVOT diam:     2.20 cm      LV E/e' lateral: 9.9 LV SV:         59 LV SV Index:   30 LVOT Area:     3.80 cm                              3D Volume EF: LV Volumes (MOD)            3D EF:        46 % LV vol  d, MOD A2C: 230.0 ml LV EDV:       311 ml LV vol d, MOD A4C: 235.0 ml LV ESV:       167 ml LV vol s, MOD A2C: 161.0 ml LV SV:        144 ml LV vol s, MOD A4C: 159.0 ml LV SV MOD A2C:     69.0 ml LV SV MOD A4C:     235.0 ml LV SV MOD BP:      77.7 ml RIGHT VENTRICLE            IVC RV Basal diam:  5.10 cm    IVC diam: 2.40 cm RV Mid diam:    3.70 cm RV S prime:     7.83 cm/s TAPSE (M-mode): 1.9 cm LEFT ATRIUM              Index        RIGHT ATRIUM           Index LA diam:        5.70 cm  2.93 cm/m   RA Area:     31.60 cm LA Vol (A2C):   148.0 ml 76.00 ml/m  RA Volume:   118.00 ml 60.60 ml/m LA Vol (A4C):   127.0 ml 65.22 ml/m LA Biplane Vol: 141.0 ml 72.41 ml/m  AORTIC VALVE AV Area (Vmax): 2.60 cm AV Vmax:        155.00 cm/s AV Peak Grad:   9.6 mmHg LVOT Vmax:      106.00 cm/s LVOT Vmean:     78.600 cm/s LVOT VTI:       0.155 m  AORTA Ao Root diam: 3.40 cm Ao Asc diam:  3.00 cm MITRAL VALVE               TRICUSPID VALVE MV Area (PHT): 5.42 cm    TR Peak grad:   60.2 mmHg MV Decel Time: 140 msec    TR Vmax:        388.00 cm/s MV E velocity: 74.10 cm/s MV A  velocity: 39.00 cm/s  SHUNTS MV E/A ratio:  1.90        Systemic VTI:  0.16 m                            Systemic Diam: 2.20 cm Oswaldo Milian MD Electronically signed by Oswaldo Milian MD Signature Date/Time: 09/12/2021/7:15:22 PM    Final    ECHOCARDIOGRAM LIMITED  Result Date: 09/20/2021    ECHOCARDIOGRAM LIMITED REPORT   Patient Name:   Roy Dyer Date of Exam: 09/20/2021 Medical Rec #:  [id number]    Height: Accession #:    [id number]   Weight: Date of Birth:  08/07/1965    BSA: Patient Age:    38 years     BP:           105/70 mmHg Patient Gender: M            HR:           88 bpm. Exam Location:  Inpatient Procedure: Limited Echo, 3D Echo and Strain Analysis Indications:    Cardiomyopathy-Ischemic I25.5  History:        Patient has prior history of Echocardiogram examinations, most                 recent 09/19/2021. CAD, Defibrillator, Arrythmias:LBBB and Atrial  Fibrillation; Risk Factors:Sleep Apnea, Hypertension and                 Dyslipidemia. End stage renal disease s/p renal transplant.                 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 Ischemic cardiomyopathy.  Sonographer:    Darlina Sicilian RDCS Referring Phys: [id number] Frederik Pear  Sonographer Comments: Echo performed with patient supine and on artificial respirator. IMPRESSIONS  1. Left ventricular ejection fraction, by estimation, is 25 to 30%. Left ventricular ejection fraction by 3D volume is 27 %. The left ventricle has severely decreased function. The left ventricle demonstrates regional wall motion abnormalities (see scoring diagram/findings for description). All apical segments and the LV apex appear akinetic/aneurysmal. The rest of the LV segments are severely hypokinetic. The average left ventricular global longitudinal strain is -7.0 %. The global longitudinal strain is abnormal.  2. Right ventricular systolic function is severely  reduced. The right ventricular size is moderately enlarged.  3. The mitral valve is abnormal. Mild to moderate mitral valve regurgitation.  4. Tricuspid valve regurgitation is moderate to severe.  5. The aortic valve is tricuspid. There is mild thickening of the aortic valve. Aortic valve regurgitation is trivial. No aortic stenosis is present.  6. A small pericardial effusion is present. There is no evidence of cardiac tamponade. Comparison(s): Compared to prior limited TTE in 09/19/21, there is no significant change. FINDINGS  Left Ventricle: Left ventricular ejection fraction, by estimation, is 25 to 30%. Left ventricular ejection fraction by 3D volume is 27 %. The left ventricle has severely decreased function. The left ventricle demonstrates regional wall motion abnormalities. All apical segments and the LV apex appear akinetic/aneurysmal. The rest of the LV segments are severely hypokinetic. The average left ventricular global longitudinal strain is -7.0 %. The global longitudinal strain is abnormal. Abnormal (paradoxical) septal motion, consistent with RV pacemaker. Right Ventricle: The right ventricular size is moderately enlarged. Right ventricular systolic function is severely reduced. Pericardium: A small pericardial effusion is present. There is no evidence of cardiac tamponade. Mitral Valve: The mitral valve is abnormal. There is mild thickening of the mitral valve leaflet(s). There is mild calcification of the mitral valve leaflet(s). Mild to moderate mitral valve regurgitation. Tricuspid Valve: The tricuspid valve is normal in structure. Tricuspid valve regurgitation is moderate to severe. Aortic Valve: The aortic valve is tricuspid. There is mild thickening of the aortic valve. Aortic valve regurgitation is trivial. No aortic stenosis is present. Pulmonic Valve: The pulmonic valve was normal in structure. Aorta: The aortic root and ascending aorta are structurally normal, with no evidence of  dilitation. Venous: IVC assessment for right atrial pressure unable to be performed due to mechanical ventilation. Additional Comments: A device lead is visualized. LEFT VENTRICLE PLAX 2D LVOT diam:     2.30 cm LV SV:         59              2D LVOT Area:     4.15 cm        Longitudinal                                Strain  2D Strain GLS  -7.0 %                                Avg:                                 3D Volume EF                                LV 3D EF:    Left                                             ventricul                                             ar                                             ejection                                             fraction                                             by 3D                                             volume is                                             27 %.                                 3D Volume EF:                                3D EF:        27 % AORTIC VALVE LVOT Vmax:   103.00 cm/s LVOT Vmean:  64.500 cm/s LVOT VTI:    0.143 m TRICUSPID VALVE TV Peak grad:   38.4 mmHg TV Vmax:        3.10 m/s TR Peak grad:   38.4 mmHg TR Vmax:        310.00 cm/s  SHUNTS Systemic VTI:  0.14 m Systemic Diam: 2.30 cm Gwyndolyn Kaufman MD Electronically signed by Gwyndolyn Kaufman MD Signature Date/Time: 09/20/2021/8:46:39 AM    Final    ECHOCARDIOGRAM LIMITED  Result Date:  09/19/2021    ECHOCARDIOGRAM LIMITED REPORT   Patient Name:   Roy Dyer Date of Exam: 09/19/2021 Medical Rec #:  [id number]    Height:       67.0 in Accession #:    [id number]   Weight:       162.0 lb Date of Birth:  1966-01-01    BSA:          1.849 m Patient Age:    32 years     BP:           102/66 mmHg Patient Gender: M            HR:           93 bpm. Exam Location:  Inpatient Procedure: Limited Echo and Color Doppler Indications:    Congestive Heart Failure                 Pericardial effusion  History:        Patient has prior history of  Echocardiogram examinations, most                 recent 09/12/2021. CHF, Pacemaker, Arrythmias:Atrial Flutter; Risk                 Factors:Hypertension and HLD.  Sonographer:    Joette Catching RCS Referring Phys: [id number] LINDSAY NICOLE [place]  1. Pericardial effusion appears small on this study. No signs of tamponade.  2. Left ventricular ejection fraction, by estimation, is 25 to 30%. The left ventricle has severely decreased function. The left ventricle demonstrates regional wall motion abnormalities (see scoring diagram/findings for description). The left ventricular internal cavity size was severely dilated. There is the interventricular septum is flattened in systole and diastole, consistent with right ventricular pressure and volume overload.  3. Right ventricular systolic function is severely reduced. The right ventricular size is moderately enlarged. There is severely elevated pulmonary artery systolic pressure. The estimated right ventricular systolic pressure is 06.2 mmHg.  4. A small pericardial effusion is present. The pericardial effusion is circumferential.  5. Tricuspid valve regurgitation is moderate to severe.  6. The aortic valve is tricuspid. Aortic valve regurgitation is not visualized. No aortic stenosis is present.  7. The inferior vena cava is dilated in size with <50% respiratory variability, suggesting right atrial pressure of 15 mmHg. Comparison(s): Changes from prior study are noted. Pericardial effusion is small on this study. WMA more apparent on this study. Akinetic apex. FINDINGS  Left Ventricle: Left ventricular ejection fraction, by estimation, is 25 to 30%. The left ventricle has severely decreased function. The left ventricle demonstrates regional wall motion abnormalities. The left ventricular internal cavity size was severely dilated. There is no left ventricular hypertrophy. The interventricular septum is flattened in systole and diastole, consistent with right  ventricular pressure and volume overload.  LV Wall Scoring: The apical septal segment, apical anterior segment, apical inferior segment, and apex are akinetic. Right Ventricle: The right ventricular size is moderately enlarged. Right ventricular systolic function is severely reduced. There is severely elevated pulmonary artery systolic pressure. The tricuspid regurgitant velocity is 3.83 m/s, and with an assumed right atrial pressure of 15 mmHg, the estimated right ventricular systolic pressure is 37.6 mmHg. Pericardium: A small pericardial effusion is present. The pericardial effusion is circumferential. Tricuspid Valve: The tricuspid valve is grossly normal. Tricuspid valve regurgitation is moderate to severe. No evidence of tricuspid stenosis. Aortic Valve: The aortic valve is tricuspid. Aortic valve regurgitation is not visualized. No  aortic stenosis is present. Pulmonic Valve: The pulmonic valve was grossly normal. Pulmonic valve regurgitation is trivial. No evidence of pulmonic stenosis. Venous: The inferior vena cava is dilated in size with less than 50% respiratory variability, suggesting right atrial pressure of 15 mmHg. Additional Comments: There is a small pleural effusion in the left lateral region. LEFT VENTRICLE PLAX 2D LVIDd:         7.10 cm LVIDs:         6.10 cm LV PW:         1.10 cm LV IVS:        1.00 cm  IVC IVC diam: 2.20 cm PULMONIC VALVE PR End Diast Vel: 20.61 msec  TRICUSPID VALVE TR Peak grad:   58.7 mmHg TR Vmax:        383.00 cm/s Eleonore Chiquito MD Electronically signed by Eleonore Chiquito MD Signature Date/Time: 09/19/2021/3:38:42 PM    Final     Microbiology No results found for this or any previous visit (from the past 240 hour(s)).  Lab Basic Metabolic Panel: No results for input(s): NA, K, CL, CO2, GLUCOSE, BUN, CREATININE, CALCIUM, MG, PHOS in the last 168 hours. Liver Function Tests: No results for input(s): AST, ALT, ALKPHOS, BILITOT, PROT, ALBUMIN in the last 168 hours. No  results for input(s): LIPASE, AMYLASE in the last 168 hours. No results for input(s): AMMONIA in the last 168 hours. CBC: No results for input(s): WBC, NEUTROABS, HGB, HCT, MCV, PLT in the last 168 hours. Cardiac Enzymes: No results for input(s): CKTOTAL, CKMB, CKMBINDEX, TROPONINI in the last 168 hours. Sepsis Labs: No results for input(s): PROCALCITON, WBC, LATICACIDVEN in the last 168 hours.  Procedures/Operations  09/16/21 hd catheter placement 09/19/21 arterial line placement 09/19/21 endotracheal intubation 09/20/21 bronchoscopy, BAL   Maryjane Hurter 10/05/2021, 9:34 AM

## 2021-10-10 ENCOUNTER — Encounter: Payer: Self-pay | Admitting: Family Medicine

## 2021-10-10 NOTE — Telephone Encounter (Signed)
Signed and put in box to go up front. Signed:  Crissie Sickles, MD           10/10/2021

## 2021-10-11 NOTE — Telephone Encounter (Signed)
Noted. Given to [facility] for assistance

## 2021-10-12 NOTE — Progress Notes (Addendum)
07-Oct-2021 ?APP Noe Gens and I saw and evaluated the patient. Discussed with them and agree with their findings and plan as documented in the their note. ? ?I have seen and evaluated the patient for acute hypoxic respiratory failure, suspected acute encephalopathy due to anoxic brain injury ? ?S:  ?Agitation overnight requiring precedex reportedly.  ? ?O: ?Blood pressure 100/60, pulse 71, temperature 98.8 ?F (37.1 ?C), temperature source Rectal, resp. rate (!) 25, height _0  (1.702 m), weight 76 kg, SpO2 100 %.  ? ?Exam: ?Gen:       Intubated ?HEENT:  does not track during my exam ?Lungs:    coarse bilaterally; equal chest rise ?CV:         Regular rate and rhythm; no murmurs ?Abd:      + hypoactive bowel sounds; soft, non-tender; no palpable masses, no distension ?Ext:    trace edema; lukewarm ?Neuro:    does not wake to loud verbal stimulation ? ?Labs reviewed  ? ?MR Brain without e/o anoxic injury ? 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 ?P:  ?- full vent support, LTVV ?- VAP bundle ?- can hopefully remain off propofol, wean precedex for RASS 0 to -1 ?- SAT/SBT tomorrow AM ?- continue ABX, hold home IS, follow BAL culture data ?- appreciate neurology, nephrology involvement ?- ongoing GoC discussion pending neurologic recovery ? ? ?Patient critically ill due to acute hypoxic respiratory failure ?Interventions to address this today oversight of analgesosedation strategy ?Risk of deterioration without these interventions is high ? ?I personally spent 34 minutes providing critical care not including any separately billable  procedures ? ? ?Roy Dyer ?[place] ? ? ?

## 2021-10-12 NOTE — Progress Notes (Signed)
Pt family is leaving at this time. No belongings in the room. Contact information and funeral home info obtained. Explained to family that funeral home will reach out to them tomorrow. ?

## 2021-10-12 NOTE — Progress Notes (Signed)
? ? Advanced Heart Failure Rounding Note ? ?PCP-Cardiologist: [name], MD  ? ?Subjective:   ? ?05/06 Started CRRT ?05/09 CRRT stopped. Limited echo EF 25-30% with severe RV dysfunction small pericardial effusion. Worsening O2 needs. ?5/10 Hypotensive started on NE and subsequently suffered PEA arrest. Achieved ROSC after ~ 10 min. Emergently intubated. POCUS TTE at time of event LVEF 35%, severely decreased RV. Concern for seizure activity and unresponsive post arrest (no sedation) - Neuro consulted. ? ? ?Echo 5/10: EF 25-30%, RV severely reduced, moderate to severe TR, small pericardial effusion ? ?EEG - no seizure.  ? ?Now on azithromycin, meropenem, clindamycin,  micafungin and steroids.  ? ?Remains unresponsive. For brain MRI today. Anuric. Had HD yesterday,  ? ?On low-dose NE, ? ?Objective:   ?Weight Range: ?76 kg ?Body mass index is 26.24 kg/m?.  ? ?Vital Signs:   ?Temp:  [96.9 ?F (36.1 ?C)-99.9 ?F (37.7 ?C)] 98.8 ?F (37.1 ?C) (05/12 0400) ?Pulse Rate:  [62-104] 76 (05/12 0748) ?Resp:  [16-24] 21 (05/12 0748) ?BP: (94-183)/(52-76) 118/70 (05/12 0748) ?SpO2:  [96 %-100 %] 96 % (05/12 0748) ?Arterial Line BP: (99-184)/(47-101) 148/61 (05/12 0500) ?FiO2 (%):  [40 %-50 %] 40 % (05/12 0748) ?Weight:  [76 kg] 76 kg (05/12 0500) ?Last BM Date : 09/19/21 ? ?Weight change: ?Filed Weights  ? 09/19/21 0557 09/21/21 0545 Oct 20, 2021 0500  ?Weight: 73.5 kg 74.4 kg 76 kg  ? ? ?Intake/Output:  ? ?Intake/Output Summary (Last 24 hours) at 10/20/2021 0804 ?Last data filed at 2021/10/20 0700 ?Gross per 24 hour  ?Intake 2725.42 ml  ?Output 1450 ml  ?Net 1275.42 ml  ? ?  ? ? ?Physical Exam  ? ?General:  Intubated/sedated ?HEENT: normal + ETT ?Neck: supple. RIJ HD . Carotids 2+ bilat; no bruits. No lymphadenopathy or thryomegaly appreciated. ?Cor: PMI nondisplaced. Regular rate & rhythm. No rubs, gallops or murmurs. ?Lungs: coarse ?Abdomen: soft, nontender, nondistended. No hepatosplenomegaly. No bruits or masses. Good bowel  sounds. ?Extremities: no cyanosis, clubbing, rash, edema ?Neuro: unresponsive on vent ? ? ?Telemetry  ?  ?SR 70s Personally reviewed ? ?Labs  ?  ?CBC ?Recent Labs  ?  09/21/21 ?1400 2021-10-20 ?0435  ?WBC 24.0* 23.4*  ?NEUTROABS 21.6*  --   ?HGB 10.9* 11.2*  ?HCT 36.2* 36.4*  ?MCV 77.5* 76.2*  ?PLT 365 358  ? ? ?Basic Metabolic Panel ?Recent Labs  ?  09/21/21 ?1353 09/21/21 ?1508 2021-10-20 ?0435  ?NA 136  --  134*  ?K 4.1  --  3.7  ?CL 102  --  98  ?CO2 20*  --  23  ?GLUCOSE 255*  --  230*  ?BUN 85*  --  53*  ?CREATININE 4.22*  --  2.76*  ?CALCIUM 8.2*  --  8.1*  ?MG  --  2.3 2.6*  ?PHOS 7.3* 3.2 4.6  ? ? ?Liver Function Tests ?Recent Labs  ?  09/19/21 ?2332 09/20/21 ?7096 09/21/21 ?1353 Oct 20, 2021 ?0435  ?AST 88*  --   --  55*  ?ALT 36  --   --  42  ?ALKPHOS 191*  --   --  181*  ?BILITOT 2.2*  --   --  0.9  ?PROT 4.9*  --   --  4.7*  ?ALBUMIN 2.1*   < > 2.1* 2.2*  2.2*  ? < > = values in this interval not displayed.  ? ? ?No results for input(s): LIPASE, AMYLASE in the last 72 hours. ?Cardiac Enzymes ?No results for input(s): CKTOTAL, CKMB, CKMBINDEX, TROPONINI in  the last 72 hours. ? ? ?BNP: ?BNP (last 3 results) ?Recent Labs  ?  05/02/21 ?1234 10/08/2021 ?1617 09/14/21 ?0308  ?BNP 1,152.1* 1,553.9* 1,395.1*  ? ? ? ?ProBNP (last 3 results) ?No results for input(s): PROBNP in the last 8760 hours. ? ? ?D-Dimer ?No results for input(s): DDIMER in the last 72 hours. ?Hemoglobin A1C ?No results for input(s): HGBA1C in the last 72 hours. ?Fasting Lipid Panel ?Recent Labs  ?  09/21/21 ?0500  ?TRIG 103  ? ? ?Thyroid Function Tests ?Recent Labs  ?  09/19/21 ?2332  ?TSH 10.826*  ? ? ? ? ?Other results: ? ? ?Imaging  ? ? ?No results found. ? ? ?Medications:   ? ? ?Scheduled Medications: ? atorvastatin  80 mg Per Tube QHS  ? chlorhexidine  15 mL Mouth Rinse BID  ? chlorhexidine gluconate (MEDLINE KIT)  15 mL Mouth Rinse BID  ? Chlorhexidine Gluconate Cloth  6 each Topical Daily  ? Chlorhexidine Gluconate Cloth  6 each Topical Q0600   ? clonazepam  0.5 mg Per Tube QHS  ? clopidogrel  75 mg Per Tube Daily  ? famotidine  20 mg Per Tube QHS  ? feeding supplement (PROSource TF)  45 mL Per Tube TID  ? fluticasone  2 spray Each Nare Daily  ? insulin aspart  0-20 Units Subcutaneous Q4H  ? mouth rinse  15 mL Mouth Rinse 10 times per day  ? multivitamin  1 tablet Per Tube QHS  ? pantoprazole sodium  40 mg Per Tube Daily  ? PARoxetine  20 mg Per Tube QPM  ? primaquine  30 mg Per Tube Daily  ? sodium chloride flush  3 mL Intravenous Q12H  ? sodium chloride flush  3 mL Intravenous Q12H  ? sodium chloride flush  3 mL Intravenous Q12H  ? Thrombi-Pad  1 each Topical Once  ? urea   Topical BID  ? vancomycin variable dose per unstable renal function (pharmacist dosing)   Does not apply See admin instructions  ? ? ?Infusions: ? sodium chloride    ? sodium chloride 7.5 mL/hr at 09/16/21 1700  ? azithromycin Stopped (09/21/21 1746)  ? clindamycin (CLEOCIN) IV Stopped (2021/10/22 0327)  ? dexmedetomidine (PRECEDEX) IV infusion 0.2 mcg/kg/hr (10-22-21 0700)  ? DOBUTamine Stopped (09/20/21 8828)  ? feeding supplement (VITAL 1.5 CAL) 60 mL/hr at 09/21/21 2356  ? heparin 1,100 Units/hr (22-Oct-2021 0700)  ? meropenem (MERREM) IV Stopped (09/21/21 2120)  ? methylPREDNISolone (SOLU-MEDROL) injection Stopped (09/21/21 1350)  ? micafungin [facility]) IV Stopped (09/21/21 0034)  ? norepinephrine (LEVOPHED) Adult infusion 2 mcg/min (10/22/21 0700)  ? propofol (DIPRIVAN) infusion Stopped (10/22/2021 0622)  ? vasopressin Stopped (09/20/21 0730)  ? ? ?PRN Medications: ?sodium chloride, sodium chloride, acetaminophen, albuterol, baclofen, fentaNYL (SUBLIMAZE) injection, HYDROcodone-acetaminophen, melatonin, ondansetron (ZOFRAN) IV, ondansetron **OR** [DISCONTINUED] ondansetron (ZOFRAN) IV, polyethylene glycol, sodium chloride, sodium chloride flush, sodium chloride flush ? ? ? ?Patient Profile  ? ?56 y/o male w/ chronic systolic heart failure due to ICM, CAD, h/o pericarditis, Stage IIIa  CKD s/p renal transplant in 2012 due to SLE, PAF/AFL and frequent PVCs admitted for a/c CHF.  ?  ? ?Assessment/Plan  ? ?Acute on Chronic Biventricular Heart Failure ?- HX ICD in 2012 ?- Echo 12/17 EF 30%  ?- Cath 04/22/20 for worsening HF symptoms. Stable CAD. ?- Echo 6/22 EF 30-35%, Grade III DD, severe LAE, mild/mod TR ?- Echo 02/04/21: EF 30-35% No effusion, RV normal. ?- Echo 3/23: EF 35%, RV mod reduced, severe  TR ?- Admitted w/ a/c CHF w/  worsening NYHA Functional Class IIIb and fluid overload w/ AKI on CKD  ?- Echo EF 30-35% (stable), severe BAE, RV mod reduced, mod TR, Mod Pericardial Effusion w/o tamponade, IVC dilated, RVSP severely elevated 75 mmHg  ?- RHC w/ elevated biventricular filling pressures w/ high CO in setting of known peripheral shunt (AVF) + severe mixed pulmonary HTN, RA 11, PCWP 24 ?- No benefit from milrinone or DBA. Based on RHC low output does not seem to be the issue ?- Renal function and volume status worsened. Moved to ICU on 5/6 for CVVHD  ?- CVVHD off 05/09.  ?- Remains critically ill with MSOF. Suspect primarily infectious in nature though he does have severe LV dysfunction. Continue NE as needed for cardiac/BP support. Plan HD per Renal. Continue broad spectrum coverage. D/w CCM. He is now DNR.  ? ?2. Pericardial Effusion ?- moderate on echo. No tamponade. BP stable  ?- Suspect 2/2 pericarditis + CHF  ?- Limited echo 05/09 and 05/10 with small effusion ?  ?3. Mod- Severe Tricuspid Regurgitation  ?- Recent progression, Echo 9/22 TR moderate ?- Echo 3/23 TR Severe  ?- Echo 5/11 Mod-severe TR ?- functional in setting of dilated RV ?- no significant v-waves on RHC  ?  ?4. Pulmonary HTN  ?- Echo 3/23 w/ severely elevated RVSP, 68 mmHg and severe TR.  ?- Echo this admit RVSP 75.  ?- Combination WHO Groups 2+3.  ?- Given H/o SLE ? WHO Group 1 component  ? ?5. AKI no CKD Stage IIIa S/p  Renal transplant in 2012 (due to SLE): ?- Follows with nephrology, Dr. Clover Mealy and Dr. Lissa Merlin at  [facility]. ?- Baseline Creatinine 1.4-1.7 ?- Recent Scr 2.0 at [facility] 04/13/21 ?- SCr 2.73 on admit>>2.4>>3.5 -> 4.0. CVVHD started 05/06, off 05/09. Scr 3.9  today. Nephrology following had iHD 5/11 ?- Continue ster

## 2021-10-12 NOTE — Progress Notes (Signed)
Patient ID: Roy Dyer, male   DOB: Feb 20, 1966, 56 y.o.   MRN: [id number] ? [facility] ?Progress Note  ? ?Assessment/ Plan:   ?1. 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  Presented with acute kidney injury that was suspected to be from CHF decompensation however, continued to worsen and was refractory to diuretics.  He was transferred to the ICU for CRRT which was initiated on 5/6.  Stopped CRRT on 5/9 am as patient euvolemic to dry.  Transitioned to IHD on 5/11 as patient anuric with poor clearance in the setting of trying to assess mental status after an arrest.  Has RIJ nontunneled catheter as well as AVF    ?- no acute indication for HD today ?- Plan for HD again on 5/13 as long as this aligns with his goals of care ? ?2. Septic shock ?- abx and pressors per primary team  ?- stop cellcept as below  ? ?3.  Acute exacerbation of congestive heart failure: Right heart catheterization with elevated biventricular pressures with high cardiac output and severe mixed pulmonary hypertension.  Started on CRRT for extracorporeal volume unloading after refractory to diuretics.  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 ?4. PEA arrest ?- post arrest care per pulm ?- setting of septic shock ? ?5. Acute hypoxic respiratory failure  ?- optimize volume status with CRRT/HD as needed ?- s/p bronch with pulm  ?- supportive care per pulm ? ?6. CKD stage III  ?-baseline Cr around 1.7 however he prev stated closer to 2 more recently ? ?7.  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  ?- Rechecked tacrolimus level - not really possible for lower envarsus dose  per pharmacy.  Pt is now s/p CRRT as well and may be in range with change in clearance.  ?- No proteinuria or hematuria and AKI felt pre-renal as he is s/p aggressive diuresis prior to trial of CRRT.  Now with component of ATN in setting of arrest.  Defer allograft biopsy  ? ?8.  Immunosuppression   ?- envarsus was transitioned to tacrolimus due to need to give through tube.  Appears has now been stopped in setting of septic shock ?- FK506 level was 9 from 5/9 but was from when on envarsus.   ?- checking another FK506 trough - appreciate pharmacy ?- reduced cellcept to 250 mg BID in the setting of septic shock. Agree with stopping cellcept ?- Patient is now on solumedrol  ?- broad spectrum anti-infective coverage is in place including for fungal organisms ? ?9. Atrial flutter ?- noted. Per primary team  ? ?10.  Hyponatremia: Secondary to CHF exacerbation and acute kidney injury, improving with RRT ? ?11. Hyperphos - setting of AKI; hold phoslo - prev low on CRRT; controlled with HD ? ?12. recurrent pericarditis  had been on colchicine  ? ?Disposition - continue monitoring in ICU ? ?Subjective:   ?He was anuric over 5/11.  Had IHD on 5/11 with 1 kg UF.  Pulm has been discussing goals of care with his family.  He is on levo at 2 mcg/min.  No acute events overnight.  Has an MRI planned for later today  per nursing.  ? ?Review of systems:   ?Unable to obtain given intubated  ? ?Objective:   ?BP 118/70   Pulse 62   Temp 98.8 ?F (37.1 ?C) (Rectal)   Resp 20   Ht '5\' 7"'$  (1.702 m)   Wt 76 kg   SpO2 100%   BMI 26.24 kg/m?  ? ?Intake/Output Summary (Last 24 hours) at 10-08-2021 0554 ?Last data filed at Oct 08, 2021 0000 ?Gross per 24 hour  ?Intake 2268 ml  ?Output 1000 ml  ?Net 1268 ml  ? ?Weight change: 1.57 kg ? ?Physical Exam:       ?Gen: adult male in bed critically ill  ?HEENT NCAT; eyes closed ?Neck - trachea midline normal circumference ?CVS: S1S2 no rub ?Resp: coarse mechanical breath sounds; FIO2 40 and PEEP 5 ?Abd:  Soft, flat, nontender ?Ext: no lower extremity edema appreciated ?Neuro- does move left foot slightly to exam; sedation running ?Psych unable to assess ?Access RIJ nontunn HD catheter; bruit and thrill LUE AVF ? ? ?Imaging: ?CT HEAD WO CONTRAST (5MM) ? ?Result Date: 09/20/2021 ?CLINICAL DATA:  Mental status change with unknown cause EXAM: CT HEAD WITHOUT CONTRAST TECHNIQUE: Contiguous axial images were obtained from the base of the skull through the vertex without intravenous contrast. RADIATION DOSE REDUCTION: This exam was performed according to the departmental dose-optimization program which includes automated exposure control, adjustment of the mA and/or kV according to patient size and/or use of iterative reconstruction technique. COMPARISON:  05/17/2017 FINDINGS: Brain: No evidence of acute infarction, hemorrhage, hydrocephalus, extra-axial collection or mass lesion/mass effect. Vascular: No hyperdense vessel or unexpected calcification. Skull: Normal. Negative for fracture or focal lesion. Sinuses/Orbits: No acute finding. IMPRESSION: Negative head CT. Electronically Signed   By: Jorje Guild M.D.   On: 09/20/2021 06:20  ? ?DG CHEST PORT 1 VIEW ? ?Result Date: 09/21/2021 ?CLINICAL DATA:  Ventilator dependence. EXAM: PORTABLE CHEST 1 VIEW COMPARISON:  09/19/2021 FINDINGS: 0615 hours. Endotracheal tube tip is 6.6 cm above the base of the carina. A feeding tube passes into the stomach although the distal tip position is not included on the film. The cardio pericardial silhouette is enlarged. There is pulmonary vascular congestion with probable underlying interstitial edema. Right central line tip overlies the mid SVC. Right pacer/AICD again noted. Telemetry leads overlie the chest. IMPRESSION: 1. Endotracheal tube tip is 6.6 cm above the base of the carina. 2. Pulmonary vascular congestion with similar underlying interstitial edema. Electronically Signed   By: Misty Stanley M.D.   On: 09/21/2021 09:03  ? ?DG  Abd Portable 1V ? ?Result Date: 09/20/2021 ?CLINICAL DATA:  Feeding tube placement EXAM: PORTABLE ABDOMEN - 1 VIEW COMPARISON:  None Available. FINDINGS: Feeding tube enters the stomach with the tip in the distal body of the stomach. Normal bowel gas pattern. AICD with lead in the region of the right ventricle. IMPRESSION: Feeding tube tip distal body of stomach.  Normal bowel gas pattern. Electronically Signed   By: Franchot Gallo M.D.   On: 09/20/2021 15:13  ? ?Overnight EEG with video ? ?Result Date: 09/20/2021 ?Lora Havens, MD     09/20/2021  1:05 PM Patient Name: ZAEEM KANDEL MRN: [id number] Epilepsy Attending: Lora Havens Referring Physician/Provider: Donnetta Simpers, MD Duration: 09/20/2021 0730 to 1120  Patient history: 56 year old male status post cardiac arrest.  EEG to evaluate for seizure.  Level of alertness: comatose  AEDs during EEG study: Clonazepam  Technical aspects: This EEG study was done with scalp electrodes positioned according to  the 10-20 International system of electrode placement. Electrical activity was acquired at a sampling rate of '500Hz'$  and reviewed with a high frequency filter of '70Hz'$  and a low frequency filter of '1Hz'$ . EEG data were recorded continuously and digitally stored.  Description: EEG showed burst suppression pattern with bursts of 3 to 5 Hz theta-delta slowing lasting 2 to 3 seconds alternating with 2 to 3 seconds of generalized EEG suppression. Hyperventilation and photic stimulation were not performed.    ABNORMALITY -Burst suppression, generalized  IMPRESSION: This study is suggestive of severe to profound diffuse encephalopathy, nonspecific to etiology. No seizures or epileptiform discharges were seen throughout the recording.  Priyanka Barbra Sarks   ? ?ECHOCARDIOGRAM LIMITED ? ?Result Date: 09/20/2021 ?   ECHOCARDIOGRAM LIMITED REPORT   Patient Name:   ULRICK METHOT Date of Exam: 09/20/2021 Medical Rec #:  [id number]    Height: Accession #:    [id number]   Weight: Date  of Birth:  [birth year]    BSA: Patient Age:    24 years     BP:           105/70 mmHg Patient Gender: M            HR:           88 bpm. Exam Location:  Inpatient Procedure: Limited Echo, 3D Echo and Strain

## 2021-10-12 NOTE — Plan of Care (Signed)
?  Problem: Activity: ?Goal: Ability to tolerate increased activity will improve ?Outcome: Progressing ?  ?Problem: Clinical Measurements: ?Goal: Ability to maintain a body temperature in the normal range will improve ?Outcome: Progressing ?  ?Problem: Respiratory: ?Goal: Ability to maintain adequate ventilation will improve ?Outcome: Progressing ?Goal: Ability to maintain a clear airway will improve ?Outcome: Progressing ?  ?Problem: Education: ?Goal: Knowledge of General Education information will improve ?Description: Including pain rating scale, medication(s)/side effects and non-pharmacologic comfort measures ?Outcome: Progressing ?  ?Problem: Health Behavior/Discharge Planning: ?Goal: Ability to manage health-related needs will improve ?Outcome: Progressing ?  ?Problem: Clinical Measurements: ?Goal: Ability to maintain clinical measurements within normal limits will improve ?Outcome: Progressing ?Goal: Will remain free from infection ?Outcome: Progressing ?Goal: Diagnostic test results will improve ?Outcome: Progressing ?Goal: Respiratory complications will improve ?Outcome: Progressing ?Goal: Cardiovascular complication will be avoided ?Outcome: Progressing ?  ?Problem: Activity: ?Goal: Risk for activity intolerance will decrease ?Outcome: Progressing ?  ?Problem: Nutrition: ?Goal: Adequate nutrition will be maintained ?Outcome: Progressing ?  ?Problem: Coping: ?Goal: Level of anxiety will decrease ?Outcome: Progressing ?  ?Problem: Elimination: ?Goal: Will not experience complications related to bowel motility ?Outcome: Progressing ?Goal: Will not experience complications related to urinary retention ?Outcome: Progressing ?  ?Problem: Pain Managment: ?Goal: General experience of comfort will improve ?Outcome: Progressing ?  ?Problem: Safety: ?Goal: Ability to remain free from injury will improve ?Outcome: Progressing ?  ?Problem: Skin Integrity: ?Goal: Risk for impaired skin integrity will decrease ?Outcome:  Progressing ?  ?Problem: Education: ?Goal: Ability to demonstrate management of disease process will improve ?Outcome: Progressing ?Goal: Ability to verbalize understanding of medication therapies will improve ?Outcome: Progressing ?Goal: Individualized Educational Video(s) ?Outcome: Progressing ?  ?Problem: Activity: ?Goal: Capacity to carry out activities will improve ?Outcome: Progressing ?  ?Problem: Cardiac: ?Goal: Ability to achieve and maintain adequate cardiopulmonary perfusion will improve ?Outcome: Progressing ?  ?

## 2021-10-12 NOTE — Progress Notes (Signed)
?  Interdisciplinary Goals of Care Family Meeting ? ? ?Date carried out:: 01-Oct-2021 ? ?Location of the meeting: Conference room ? ?Member's involved: Physician and Family Member or next of kin ? ?Durable Power of Tour manager: Pt's spouse   ? ?Discussion: We discussed goals of care for Roy Dyer .  Pt's spouse and extended family present for discussion. Even with miraculous recovery he'd be facing rocky road away from home in long term acute care or skilled nursing with a loss of independence that he would find acceptable. They worry he would not have wanted even current supportive measures. They are in agreement that we should focus on his comfort. They ask to speak with chaplain. He was made comfort measures only. ? ?Code status: Full DNR - comfort measures ? ?Disposition: In-patient comfort care ? ? ?Time spent for the meeting: 15 minutes ? ?Maryjane Hurter ?10-01-21, 5:54 PM ? ?

## 2021-10-12 NOTE — Progress Notes (Signed)
Pt transported to MRI and back 2H 18 on full vent support. No complications noted. ?

## 2021-10-12 NOTE — Progress Notes (Signed)
2015 family notified they can return to pt room. He is extubated and on morphine drip. Levophed off and pt on 4 lpm of oxygen and will wean. Family updated on plan of care.  ?

## 2021-10-12 NOTE — Progress Notes (Signed)
? ?NAME:  Roy Dyer, MRN:  [id number], DOB:  [birth year], LOS: 30 ?ADMISSION DATE:  10/08/2021, CONSULTATION DATE:  09/16/21 ?REFERRING MD:  TRH, CHIEF COMPLAINT:  SOB  ? ?History of Present Illness:  ?56 year old man with hx of ESRD s/p renal transplant now with CKD, CAD, Afib who presented with insidious onset SOB.  Found to be in biventricular failure, failed diuretic+inotrope trial.  Due to low Bps, worsening dyspnea and edema, transferred to ICU and PCCM to assume care from [facility]. ? ?Patient doing okay as long as he sits up, dyspnea with minimal movement. ? ?Pertinent  Medical History  ?History of renal transplant ?Aflutter ?SLE ?CAD ?OSA ? ?Significant Hospital Events: ?Including procedures, antibiotic start and stop dates in addition to other pertinent events   ?5/1 Admitted ?5/3 R/HC ?[place] 09/14/21 ?RA = 11 (no significant v-waves) ?RV = 77/15 ?PA = 79/35 (55) ?PCW = 24 ?Fick cardiac output/index = 6.96/3.61 ?Thermo = 6.92/3.59 ?PVR = 4.5 WU ?RA sat = 99% ?PA sat = 71%, 77%, 79% ?SVC sat = 86% ?5/6 worsening resp distress, to ICU, CRRT started ?5/9 euvolemic, CRRT Dc'd, witnessed PEA arrest x 12 mins ?5/11 [place] meeting. Purposeful movements noted. Minimal levophed. Triggering vent.  ?5/12 MRI brain wnl  ? ?Interim History / Subjective:  ?Afebrile  ?Culture negative thus far  ?Remains on abx, sedation on vent  ?RN reports follow commands on WUA  ?I/O 464m, 1L volume removed with UF, +1.5L in last 24 hours  ? ?Objective   ?Blood pressure 100/60, pulse 67, temperature 98.8 ?F (37.1 ?C), temperature source Rectal, resp. rate 20, height '5\' 7"'$  (1.702 m), weight 76 kg, SpO2 100 %. ?CVP:  [1 mmHg-7 mmHg] 6 mmHg  ?Vent Mode: PRVC ?FiO2 (%):  [40 %-50 %] 40 % ?Set Rate:  [20 bmp] 20 bmp ?Vt Set:  [530 mL] 530 mL ?PEEP:  [5 cmH20] 5 cmH20 ?Plateau Pressure:  [16 cmH20-21 cmH20] 21 cmH20  ? ?Intake/Output Summary (Last 24 hours) at 5May 22, 20230828 ?Last data filed at 5May 22, 20230800 ?Gross per 24 hour  ?Intake 2760.58 ml  ?Output  1450 ml  ?Net 1310.58 ml  ? ?Filed Weights  ? 09/19/21 0557 09/21/21 0545 0May 22, [id number]  ?Weight: 73.5 kg 74.4 kg 76 kg  ? ? ?Examination: ?General: ill appearing adult male lying in bed on vent in NAD  ?HEENT: MM pink/moist, ETT, anicteric, right IJ HD cath c/d/i ?Neuro: sedate, moves all ext on WUA, follows simple commands, pupils reactive  ?CV: s1s2 irr irr, no m/r/g ?PULM: non-labored at rest, crackles  ?GI: soft, bsx4 active  ?Extremities: warm/dry, trace edema, LUE AVF +thrill/bruit ?Skin: no rashes or lesions, pale  ? ?Resolved Hospital Problem list   ?N/A ? ?Assessment & Plan:  ? 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  However, further discussion with family today reveals chronic aspiration type symptoms which may be cause of his progressive weight loss, dyspnea and now ARDS. ?S/P IHCA 5/9 evening- presumably driven by worsening respiratory status ?Chronic Systolic CHF ?AF on Anticoagulation  ?At Risk for anoxic brain damage - on TTM, s/p cardiac arrest  ?Afib on AC ?HFrEF- euvolemic at present, CHF team following.  Was thought that his dypnea was driven by high output cardiac failure from large LUE fistula; however O2 needs and edema type pattern persisted despite euvolemia leading to CT of chest and findings of DAD ?Hx SLE, renal tx on DMARD/pred PTA ?AKI on CKD   ?  FTT POA ?Moderate to severe protein calorie malnutrition POA ?Hyperglycemia  ? ?-PRVC 8cc/kg  ?-VAP prevention measures  ?-continue cefepime, vanco, azithro, eraxis, clinda, primaquine > narrow when able  ?-await cultures  ?-HD / volume status per Nephrology  ?-hold home DMARD, steroids   ?-follow up labs in am  ?-trend neuro exam  ?-MRI brain reassuring  ?-per family discussion 5/11, if patient not waking meaningfully family may want to transition to comfort measures ?-minimize sedation, PAD protocol with precedex + PRN fentanyl ?-glucose control adjusted > added meal coverage and long acting   ?-appreciate Neurology  ?-heparin infusion per pharmacy  ?  ? ?Best Practice (right click and "Reselect all SmartList Selections" daily)  ?Diet/type: TF ?DVT prophylaxis: heparin gtt ?GI prophylaxis: H2B ?Lines: Dialysis Catheter ?Foley:  N/A ?Code Status:  DNR ?Last date of multidisciplinary goals of care discussion: 5/11.  Await family arrival 5/12 ? ?Critical Care Time: 34 minutes  ? ?Noe Gens, MSN, APRN, NP-C, AGACNP-BC ?[facility] ?10/02/21, 8:29 AM ? ? ?Please see [email or web address] for pager details.  ? ?From 7A-7P if no response, please call [phone] ?After hours, please call Warren Lacy [phone] ? ? ?

## 2021-10-12 NOTE — Progress Notes (Addendum)
Neurology Progress Note ? ?Patient ID: 56 y.o. male with PMH significant for CHF with AICD placement, hx of GERD, SLE, ESRD s/p renal transplant, CKD, CAD, Afib who is admitted to the ICU with acute on chronic biventricular failure, acute hypoxemic respiratory failure 2/2 heart failure and ?pneumonia, AKI on CKD. He was admitted on 5/1 for dyspnea and was transferred to ICU on 5/6 with worsening respiratory distress. He had a witnessed in hospital arrest on 5/9. ? ?Subjective: Triggering vent. ICU team has been weaning sedation. He became v agitated overnight on low dose precedex, was moving around in bed and trying to throw his legs over the side. Propofol was stopped at 0830 again today. Precedex was paused x30 min prior to my examination. ? ?EEG 5/10 showed burst suppression with no electrographic seizures recorded ? ?O: ? ?Vitals:  ? 10-02-21 0800 10-02-2021 0815  ?BP: 100/60   ?Pulse: 69 67  ?Resp: 19 20  ?Temp:    ?SpO2: 98% 100%  ? ?Gen: arousable to loud voice and was able to attend after that and follow some simple commands ?CV: RRR ?Resp: intubated ? ?MS: arousable to loud voice and was able to attend after that and follow some simple commands (look L, look R, raise arm and wiggle toes). Follows commands more briskly on R than L. ?Speech: intubated, no attempt to speak ?CN: pupils 4-78m L reactive, 3-476mR, reactive to light. (+) corneals, oculocephalics, cough, gag. Face symmetric at rest. Hearing intact to voice. ?Motor: moves all extremities spontaneously and to command; more briskly on the R than the L ?Sensory: withdraws to noxious stimuli more briskly on R than L ?Reflexes: 1+ symm throughout toes mute bilat ?Coordination, gait: UTA ? ?A/P: [age].o. male with PMH significant for CHF with AICD placement, hx of GERD, SLE, ESRD s/p renal transplant, CKD, CAD, Afib who is admitted to the ICU with acute on chronic biventricular failure, acute hypoxemic respiratory failure 2/2 heart failure and ?pneumonia,  AKI on CKD. He was admitted on 5/1 for dyspnea and was transferred to ICU on 5/6 with worsening respiratory distress. He had a witnessed in hospital arrest on 5/9. Neurology was initially consulted 2/2 pupillary abnormalities c/f seizure but EEG did now show seizures and his pupillary abnormalities (dilated and asymmetric but reactive) remain present and unchanged. His exam has improved significantly, off sedation he has intact brainstem reflexes and follows simple commands today.  ? ?- MRI brain without contrast today ?- No indication for AEDs at this time ?- Will continue to follow ? ?This patient is critically ill and at significant risk of neurological worsening, death and care requires constant monitoring of vital signs, hemodynamics,respiratory and cardiac monitoring, neurological assessment, discussion with family, other specialists and medical decision making of high complexity. I spent 50 minutes of neurocritical care time  in the care of  this patient. This was time spent independent of any time provided by nurse practitioner or PA. ? ?CoSu MonksMD ?[facility] ?[phone] ?If 7pm- 7am, please page neurology on call as listed in [place]? ?

## 2021-10-12 NOTE — Progress Notes (Deleted)
Extubated pt. Per order to RA. RN present. ?

## 2021-10-12 NOTE — Progress Notes (Signed)
ANTICOAGULATION CONSULT NOTE ?Pharmacy Consult for heparin ?Indication: atrial fibrillation ? ? ?Allergies  ?Allergen Reactions  ? Triptans Palpitations  ?  Reaction to Maxalt  ? ? ?Patient Measurements: ?Height: '5\' 7"'$  (170.2 cm) ?Weight: 76 kg (167 lb 8.8 oz) ?IBW/kg (Calculated) : 66.1 ?Heparin Dosing Weight: 77kg ? ?Vital Signs: ?Temp: 98.8 ?F (37.1 ?C) (05/12 0400) ?Temp Source: Rectal (05/12 0400) ?BP: 100/60 (05/12 0800) ?Pulse Rate: 67 (05/12 0815) ? ?Labs: ?Recent Labs  ?  09/19/21 ?2332 09/20/21 ?0150 09/20/21 ?5427 09/20/21 ?1141 09/20/21 ?1828 09/21/21 ?0500 09/21/21 ?1353 09/21/21 ?1400 09/21/21 ?1500 10-01-2021 ?0435  ?HGB 12.0*  --  11.8*  --    < > 11.8*  --  10.9*  --  11.2*  ?HCT 41.5  --  39.7  --    < > 39.9  --  36.2*  --  36.4*  ?PLT 421*  --  348  --   --  435*  --  365  --  358  ?APTT  --   --  35  --    < > 51*  --   --  102* 72*  ?LABPROT 21.9*  --   --   --   --   --   --   --   --   --   ?INR 1.9*  --   --   --   --   --   --   --   --   --   ?HEPARINUNFRC  --   --  >1.10*  --   --  >1.10*  --   --   --  >1.10*  ?CREATININE 2.75*  --  3.09*  --   --  3.93* 4.22*  --   --  2.76*  ?TROPONINIHS  --  396* 589* 646*  --   --   --   --   --   --   ? < > = values in this interval not displayed.  ? ?Estimated Creatinine Clearance: 28.6 mL/min (A) (by C-G formula based on SCr of 2.76 mg/dL (H)). ? ?Assessment: ?56 y.o. male with hx AF on apixaban PTA. Pt transitioned to IV heparin with possible need for procedures. Pt s/p PEA arrest 5/10. ? ?aPTT therapeutic x2 (most recent 72s), CBC stable. Heparin level still >1.1 (likely influenced by apixaban) so will continue to titrate via aPTT values until they are correlating.  ? ?Patient did have some bleeding due to a PIV removal which led to some bleeding, but bleeding stopped after pressure was applied.  ? ?Goal of Therapy:  ?Heparin level 0.3-0.7 units/ml ?aPTT 66-102 seconds ?Monitor platelets by anticoagulation protocol: Yes ?  ?Plan:  ?Continue  heparin at 1,100 units/hour  ?Daily aPTT/heparin level, CBC  ?Monitor for signs and symptoms of bleeding  ? ? ?Adria Dill, PharmD ?PGY-1 Acute Care Resident  ?10-01-2021 12:55 PM  ? ? ?

## 2021-10-12 NOTE — Procedures (Signed)
Extubation Procedure Note ? ?Patient Details:   ?Name: Roy Dyer ?DOB: [birth year] ?MRN: [id number] ?  ?Airway Documentation:  ?  ?Vent end date: (not recorded) Vent end time: (not recorded)  ? ?Evaluation ? O2 sats: currently acceptable ?Complications: No apparent complications ?Patient did tolerate procedure well. ?Bilateral Breath Sounds: Clear, Diminished ?  ?No ? ?One way extubation per md order. ? ?[place] ?Oct 01, 2021, 8:33 PM ? ?

## 2021-10-12 DEATH — deceased

## 2021-10-19 LAB — FUNGAL ORGANISM REFLEX

## 2021-10-19 LAB — FUNGUS CULTURE WITH STAIN

## 2021-10-19 LAB — FUNGUS CULTURE RESULT

## 2021-10-30 NOTE — Telephone Encounter (Signed)
Spoke to [place].  I faxed to medical records to complete APS. Provider is complete. Sharyn Lull will pick up today.

## 2022-01-17 ENCOUNTER — Encounter: Payer: Managed Care, Other (non HMO) | Admitting: Cardiology

## 2022-01-31 ENCOUNTER — Ambulatory Visit: Payer: Managed Care, Other (non HMO) | Admitting: Internal Medicine
